# Patient Record
Sex: Female | Born: 1945
Health system: Southern US, Community
[De-identification: ages and names within clinical notes are randomized; demographics above are authoritative.]

## PROBLEM LIST (undated history)

## (undated) DIAGNOSIS — I38 Endocarditis, valve unspecified: Secondary | ICD-10-CM

## (undated) DIAGNOSIS — R0601 Orthopnea: Secondary | ICD-10-CM

## (undated) DIAGNOSIS — R0600 Dyspnea, unspecified: Secondary | ICD-10-CM

## (undated) DIAGNOSIS — D696 Thrombocytopenia, unspecified: Secondary | ICD-10-CM

## (undated) DIAGNOSIS — K219 Gastro-esophageal reflux disease without esophagitis: Secondary | ICD-10-CM

## (undated) DIAGNOSIS — IMO0001 Reserved for inherently not codable concepts without codable children: Secondary | ICD-10-CM

## (undated) DIAGNOSIS — T8859XA Other complications of anesthesia, initial encounter: Secondary | ICD-10-CM

## (undated) DIAGNOSIS — I519 Heart disease, unspecified: Secondary | ICD-10-CM

## (undated) DIAGNOSIS — F32A Depression, unspecified: Secondary | ICD-10-CM

## (undated) DIAGNOSIS — E785 Hyperlipidemia, unspecified: Secondary | ICD-10-CM

## (undated) DIAGNOSIS — T4145XA Adverse effect of unspecified anesthetic, initial encounter: Secondary | ICD-10-CM

## (undated) DIAGNOSIS — R112 Nausea with vomiting, unspecified: Secondary | ICD-10-CM

## (undated) DIAGNOSIS — I420 Dilated cardiomyopathy: Secondary | ICD-10-CM

## (undated) DIAGNOSIS — E079 Disorder of thyroid, unspecified: Secondary | ICD-10-CM

## (undated) DIAGNOSIS — E119 Type 2 diabetes mellitus without complications: Secondary | ICD-10-CM

## (undated) DIAGNOSIS — K5790 Diverticulosis of intestine, part unspecified, without perforation or abscess without bleeding: Secondary | ICD-10-CM

## (undated) DIAGNOSIS — R609 Edema, unspecified: Secondary | ICD-10-CM

## (undated) DIAGNOSIS — Z95810 Presence of automatic (implantable) cardiac defibrillator: Secondary | ICD-10-CM

## (undated) DIAGNOSIS — E039 Hypothyroidism, unspecified: Secondary | ICD-10-CM

## (undated) DIAGNOSIS — Z9889 Other specified postprocedural states: Secondary | ICD-10-CM

## (undated) DIAGNOSIS — F419 Anxiety disorder, unspecified: Secondary | ICD-10-CM

## (undated) DIAGNOSIS — I499 Cardiac arrhythmia, unspecified: Secondary | ICD-10-CM

## (undated) DIAGNOSIS — D649 Anemia, unspecified: Secondary | ICD-10-CM

## (undated) DIAGNOSIS — F329 Major depressive disorder, single episode, unspecified: Secondary | ICD-10-CM

## (undated) DIAGNOSIS — I509 Heart failure, unspecified: Secondary | ICD-10-CM

## (undated) HISTORY — DX: Endocarditis, valve unspecified: I38

## (undated) HISTORY — PX: INSERT / REPLACE / REMOVE PACEMAKER: SUR710

## (undated) HISTORY — DX: Gastro-esophageal reflux disease without esophagitis: K21.9

## (undated) HISTORY — DX: Major depressive disorder, single episode, unspecified: F32.9

## (undated) HISTORY — DX: Type 2 diabetes mellitus without complications: E11.9

## (undated) HISTORY — DX: Dilated cardiomyopathy: I42.0

## (undated) HISTORY — DX: Heart disease, unspecified: I51.9

## (undated) HISTORY — DX: Reserved for inherently not codable concepts without codable children: IMO0001

## (undated) HISTORY — PX: BREAST EXCISIONAL BIOPSY: SUR124

## (undated) HISTORY — DX: Thrombocytopenia, unspecified: D69.6

## (undated) HISTORY — DX: Heart failure, unspecified: I50.9

## (undated) HISTORY — PX: ABDOMINAL HYSTERECTOMY: SHX81

## (undated) HISTORY — PX: BREAST BIOPSY: SHX20

## (undated) HISTORY — DX: Anxiety disorder, unspecified: F41.9

## (undated) HISTORY — PX: CARDIAC CATHETERIZATION: SHX172

## (undated) HISTORY — DX: Depression, unspecified: F32.A

## (undated) HISTORY — DX: Diverticulosis of intestine, part unspecified, without perforation or abscess without bleeding: K57.90

## (undated) HISTORY — PX: CHOLECYSTECTOMY: SHX55

## (undated) HISTORY — DX: Disorder of thyroid, unspecified: E07.9

## (undated) HISTORY — DX: Hyperlipidemia, unspecified: E78.5

---

## 1975-11-08 HISTORY — PX: PARTIAL HYSTERECTOMY: SHX80

## 2004-09-17 ENCOUNTER — Ambulatory Visit: Payer: Self-pay | Admitting: Endocrinology

## 2005-11-07 HISTORY — PX: BONE MARROW BIOPSY: SHX199

## 2006-04-29 ENCOUNTER — Emergency Department: Payer: Self-pay | Admitting: Unknown Physician Specialty

## 2006-04-29 ENCOUNTER — Other Ambulatory Visit: Payer: Self-pay

## 2007-05-03 ENCOUNTER — Ambulatory Visit: Payer: Self-pay | Admitting: Endocrinology

## 2007-10-01 ENCOUNTER — Emergency Department: Payer: Self-pay | Admitting: Emergency Medicine

## 2009-10-28 ENCOUNTER — Ambulatory Visit: Payer: Self-pay | Admitting: Endocrinology

## 2010-11-26 ENCOUNTER — Emergency Department: Payer: Self-pay | Admitting: Internal Medicine

## 2011-06-22 ENCOUNTER — Ambulatory Visit: Payer: Self-pay | Admitting: Endocrinology

## 2012-05-29 ENCOUNTER — Ambulatory Visit: Payer: Self-pay | Admitting: Endocrinology

## 2012-06-25 ENCOUNTER — Ambulatory Visit: Payer: Self-pay | Admitting: Endocrinology

## 2013-04-02 ENCOUNTER — Emergency Department: Payer: Self-pay | Admitting: Internal Medicine

## 2013-04-02 LAB — URINALYSIS, COMPLETE
Bacteria: NONE SEEN
Glucose,UR: NEGATIVE mg/dL (ref 0–75)
Leukocyte Esterase: NEGATIVE
Nitrite: NEGATIVE
Ph: 5 (ref 4.5–8.0)
Protein: 30
RBC,UR: 1 /HPF (ref 0–5)
Specific Gravity: 1.024 (ref 1.003–1.030)
Squamous Epithelial: NONE SEEN
WBC UR: 2 /HPF (ref 0–5)

## 2013-04-02 LAB — CBC
HCT: 39.8 % (ref 35.0–47.0)
HGB: 13.3 g/dL (ref 12.0–16.0)
MCH: 30.4 pg (ref 26.0–34.0)
MCV: 91 fL (ref 80–100)
Platelet: 117 10*3/uL — ABNORMAL LOW (ref 150–440)
RBC: 4.38 10*6/uL (ref 3.80–5.20)
WBC: 9.6 10*3/uL (ref 3.6–11.0)

## 2013-04-02 LAB — LIPASE, BLOOD: Lipase: 97 U/L (ref 73–393)

## 2013-04-03 LAB — COMPREHENSIVE METABOLIC PANEL
Albumin: 3.8 g/dL (ref 3.4–5.0)
Alkaline Phosphatase: 67 U/L (ref 50–136)
Bilirubin,Total: 0.9 mg/dL (ref 0.2–1.0)
Calcium, Total: 9.3 mg/dL (ref 8.5–10.1)
Co2: 27 mmol/L (ref 21–32)
Creatinine: 0.88 mg/dL (ref 0.60–1.30)
Osmolality: 273 (ref 275–301)
SGOT(AST): 17 U/L (ref 15–37)
SGPT (ALT): 16 U/L (ref 12–78)
Sodium: 136 mmol/L (ref 136–145)
Total Protein: 8.3 g/dL — ABNORMAL HIGH (ref 6.4–8.2)

## 2013-04-07 LAB — CULTURE, BLOOD (SINGLE)

## 2013-05-07 ENCOUNTER — Ambulatory Visit: Payer: Self-pay | Admitting: Gastroenterology

## 2013-05-07 LAB — HM COLONOSCOPY: HM Colonoscopy: NORMAL

## 2013-08-06 ENCOUNTER — Ambulatory Visit: Payer: Self-pay | Admitting: Family Medicine

## 2014-02-20 LAB — BASIC METABOLIC PANEL
Anion Gap: 8 (ref 7–16)
BUN: 18 mg/dL (ref 7–18)
Calcium, Total: 9.1 mg/dL (ref 8.5–10.1)
Chloride: 110 mmol/L — ABNORMAL HIGH (ref 98–107)
Co2: 24 mmol/L (ref 21–32)
Creatinine: 0.99 mg/dL (ref 0.60–1.30)
EGFR (African American): >60
EGFR (Non-African Amer.): 59 — ABNORMAL LOW
GLUCOSE: 140 mg/dL — AB (ref 65–99)
OSMOLALITY: 287 (ref 275–301)
Potassium: 4.2 mmol/L (ref 3.5–5.1)
Sodium: 142 mmol/L (ref 136–145)

## 2014-02-20 LAB — CBC
HCT: 40.4 % (ref 35.0–47.0)
HGB: 13.1 g/dL (ref 12.0–16.0)
MCH: 30.1 pg (ref 26.0–34.0)
MCHC: 32.4 g/dL (ref 32.0–36.0)
MCV: 93 fL (ref 80–100)
Platelet: 114 10*3/uL — ABNORMAL LOW (ref 150–440)
RBC: 4.34 10*6/uL (ref 3.80–5.20)
RDW: 14.3 % (ref 11.5–14.5)
WBC: 4 10*3/uL (ref 3.6–11.0)

## 2014-02-20 LAB — TROPONIN I: Troponin-I: 0.05 ng/mL

## 2014-02-21 ENCOUNTER — Inpatient Hospital Stay: Payer: Self-pay | Admitting: Internal Medicine

## 2014-02-21 LAB — TSH: THYROID STIMULATING HORM: 3.71 u[IU]/mL

## 2014-02-21 LAB — LIPID PANEL
Cholesterol: 130 mg/dL (ref 0–200)
HDL: 42 mg/dL (ref 40–60)
LDL CHOLESTEROL, CALC: 69 mg/dL (ref 0–100)
Triglycerides: 94 mg/dL (ref 0–200)
VLDL Cholesterol, Calc: 19 mg/dL (ref 5–40)

## 2014-02-21 LAB — PRO B NATRIURETIC PEPTIDE: B-TYPE NATIURETIC PEPTID: 9422 pg/mL — AB (ref 0–125)

## 2014-02-21 LAB — CK-MB: CK-MB: 3.1 ng/mL (ref 0.5–3.6)

## 2014-02-21 LAB — HEMOGLOBIN A1C: Hemoglobin A1C: 6.3 % (ref 4.2–6.3)

## 2014-02-21 LAB — TROPONIN I
Troponin-I: 0.08 ng/mL — ABNORMAL HIGH
Troponin-I: 0.09 ng/mL — ABNORMAL HIGH

## 2014-02-22 LAB — CBC WITH DIFFERENTIAL/PLATELET
EOS PCT: 1 %
HCT: 40.2 % (ref 35.0–47.0)
HGB: 12.8 g/dL (ref 12.0–16.0)
Lymphocytes: 68 %
MCH: 29.6 pg (ref 26.0–34.0)
MCHC: 31.8 g/dL — ABNORMAL LOW (ref 32.0–36.0)
MCV: 93 fL (ref 80–100)
Monocytes: 7 %
Platelet: 106 10*3/uL — ABNORMAL LOW (ref 150–440)
RBC: 4.32 10*6/uL (ref 3.80–5.20)
RDW: 14.5 % (ref 11.5–14.5)
Segmented Neutrophils: 20 %
Variant Lymphocyte - H1-Rlymph: 4 %
WBC: 3.6 10*3/uL (ref 3.6–11.0)

## 2014-02-22 LAB — BASIC METABOLIC PANEL
ANION GAP: 9 (ref 7–16)
BUN: 21 mg/dL — ABNORMAL HIGH (ref 7–18)
CALCIUM: 8.9 mg/dL (ref 8.5–10.1)
Chloride: 106 mmol/L (ref 98–107)
Co2: 26 mmol/L (ref 21–32)
Creatinine: 0.95 mg/dL (ref 0.60–1.30)
EGFR (Non-African Amer.): >60
Glucose: 93 mg/dL (ref 65–99)
Osmolality: 284 (ref 275–301)
POTASSIUM: 3.9 mmol/L (ref 3.5–5.1)
Sodium: 141 mmol/L (ref 136–145)

## 2014-02-22 LAB — CK TOTAL AND CKMB (NOT AT ARMC)
CK, TOTAL: 111 U/L
CK, Total: 103 U/L
CK-MB: 2.7 ng/mL (ref 0.5–3.6)
CK-MB: 3 ng/mL (ref 0.5–3.6)

## 2014-02-22 LAB — MAGNESIUM: MAGNESIUM: 1.6 mg/dL — AB

## 2014-02-24 LAB — CREATININE, SERUM
CREATININE: 0.86 mg/dL (ref 0.60–1.30)
EGFR (African American): >60

## 2014-02-24 LAB — PLATELET COUNT: Platelet: 112 10*3/uL — ABNORMAL LOW (ref 150–440)

## 2014-02-25 LAB — CBC WITH DIFFERENTIAL/PLATELET
Basophil #: 0 10*3/uL (ref 0.0–0.1)
Basophil %: 0.3 %
EOS ABS: 0 10*3/uL (ref 0.0–0.7)
EOS PCT: 1 %
HCT: 37.3 % (ref 35.0–47.0)
HGB: 12.1 g/dL (ref 12.0–16.0)
LYMPHS PCT: 40.5 %
Lymphocyte #: 1.4 10*3/uL (ref 1.0–3.6)
MCH: 30.3 pg (ref 26.0–34.0)
MCHC: 32.5 g/dL (ref 32.0–36.0)
MCV: 93 fL (ref 80–100)
Monocyte #: 0.5 x10 3/mm (ref 0.2–0.9)
Monocyte %: 13.9 %
Neutrophil #: 1.5 10*3/uL (ref 1.4–6.5)
Neutrophil %: 44.3 %
Platelet: 89 10*3/uL — ABNORMAL LOW (ref 150–440)
RBC: 4 10*6/uL (ref 3.80–5.20)
RDW: 14.6 % — ABNORMAL HIGH (ref 11.5–14.5)
WBC: 3.4 10*3/uL — ABNORMAL LOW (ref 3.6–11.0)

## 2014-02-25 LAB — BASIC METABOLIC PANEL
Anion Gap: 5 — ABNORMAL LOW (ref 7–16)
BUN: 13 mg/dL (ref 7–18)
CALCIUM: 8.1 mg/dL — AB (ref 8.5–10.1)
CHLORIDE: 103 mmol/L (ref 98–107)
Co2: 30 mmol/L (ref 21–32)
Creatinine: 0.81 mg/dL (ref 0.60–1.30)
EGFR (Non-African Amer.): >60
Glucose: 150 mg/dL — ABNORMAL HIGH (ref 65–99)
OSMOLALITY: 279 (ref 275–301)
POTASSIUM: 3.6 mmol/L (ref 3.5–5.1)
SODIUM: 138 mmol/L (ref 136–145)

## 2014-02-27 LAB — BASIC METABOLIC PANEL
ANION GAP: 5 — AB (ref 7–16)
BUN: 12 mg/dL (ref 7–18)
CHLORIDE: 107 mmol/L (ref 98–107)
Calcium, Total: 8.9 mg/dL (ref 8.5–10.1)
Co2: 28 mmol/L (ref 21–32)
Creatinine: 0.8 mg/dL (ref 0.60–1.30)
Glucose: 119 mg/dL — ABNORMAL HIGH (ref 65–99)
Osmolality: 280 (ref 275–301)
Potassium: 4.3 mmol/L (ref 3.5–5.1)
SODIUM: 140 mmol/L (ref 136–145)

## 2014-04-01 ENCOUNTER — Ambulatory Visit: Payer: Self-pay | Admitting: Internal Medicine

## 2014-04-07 ENCOUNTER — Ambulatory Visit: Payer: Self-pay | Admitting: Internal Medicine

## 2014-04-07 LAB — CBC CANCER CENTER
Eosinophil: 3 %
HCT: 39.2 % (ref 35.0–47.0)
HGB: 12.7 g/dL (ref 12.0–16.0)
Lymphocytes: 40 %
MCH: 29.9 pg (ref 26.0–34.0)
MCHC: 32.5 g/dL (ref 32.0–36.0)
MCV: 92 fL (ref 80–100)
Monocytes: 11 %
RBC: 4.26 10*6/uL (ref 3.80–5.20)
RDW: 13.9 % (ref 11.5–14.5)
Segmented Neutrophils: 41 %
Variant Lymphocyte: 5 %
WBC: 2.8 x10 3/mm — AB (ref 3.6–11.0)

## 2014-04-07 LAB — PROTIME-INR
INR: 1.1
PROTHROMBIN TIME: 14.1 s (ref 11.5–14.7)

## 2014-04-07 LAB — IRON AND TIBC
IRON SATURATION: 29 %
IRON: 119 ug/dL (ref 50–170)
Iron Bind.Cap.(Total): 405 ug/dL (ref 250–450)
UNBOUND IRON-BIND. CAP.: 286 ug/dL

## 2014-04-07 LAB — RETICULOCYTES
Absolute Retic Count: 0.0348 10*6/uL (ref 0.019–0.186)
Reticulocyte: 0.82 % (ref 0.4–3.1)

## 2014-04-07 LAB — FOLATE: FOLIC ACID: 27.7 ng/mL (ref 3.1–100.0)

## 2014-04-07 LAB — APTT: ACTIVATED PTT: 29.9 s (ref 23.6–35.9)

## 2014-04-07 LAB — LACTATE DEHYDROGENASE: LDH: 173 U/L (ref 81–246)

## 2014-04-07 LAB — FIBRIN DEGRADATION PROD.(ARMC ONLY): Fibrin Degradation Prod.: <10 ug/ml (ref 2.1–7.7)

## 2014-04-07 LAB — FERRITIN: Ferritin (ARMC): 18 ng/mL (ref 8–388)

## 2014-04-07 LAB — FIBRINOGEN: Fibrinogen: 279 mg/dL (ref 210–470)

## 2014-04-08 LAB — URINE IEP, RANDOM

## 2014-04-09 LAB — PROT IMMUNOELECTROPHORES(ARMC)

## 2014-04-10 DIAGNOSIS — E782 Mixed hyperlipidemia: Secondary | ICD-10-CM | POA: Insufficient documentation

## 2014-04-10 DIAGNOSIS — I38 Endocarditis, valve unspecified: Secondary | ICD-10-CM | POA: Insufficient documentation

## 2014-04-16 LAB — CBC CANCER CENTER
Comment - H1-Com1: NORMAL
Eosinophil: 4 %
HCT: 39.7 % (ref 35.0–47.0)
HGB: 12.9 g/dL (ref 12.0–16.0)
Lymphocytes: 67 %
MCH: 29.9 pg (ref 26.0–34.0)
MCHC: 32.6 g/dL (ref 32.0–36.0)
MCV: 92 fL (ref 80–100)
Monocytes: 7 %
Platelet: 92 x10 3/mm — ABNORMAL LOW (ref 150–440)
RBC: 4.31 10*6/uL (ref 3.80–5.20)
RDW: 14.2 % (ref 11.5–14.5)
SEGMENTED NEUTROPHILS: 22 %
WBC: 2.4 x10 3/mm — AB (ref 3.6–11.0)

## 2014-04-16 LAB — RETICULOCYTES
ABSOLUTE RETIC COUNT: 0.0422 10*6/uL (ref 0.019–0.186)
Reticulocyte: 0.98 % (ref 0.4–3.1)

## 2014-04-16 LAB — HM DEXA SCAN: HM Dexa Scan: NORMAL

## 2014-04-16 LAB — HM MAMMOGRAPHY

## 2014-04-24 ENCOUNTER — Ambulatory Visit: Payer: Self-pay | Admitting: Family

## 2014-05-07 ENCOUNTER — Ambulatory Visit: Payer: Self-pay | Admitting: Internal Medicine

## 2014-05-21 ENCOUNTER — Ambulatory Visit: Payer: Self-pay | Admitting: Family

## 2014-05-21 LAB — BASIC METABOLIC PANEL
Anion Gap: 6 — ABNORMAL LOW (ref 7–16)
BUN: 10 mg/dL (ref 7–18)
CREATININE: 0.78 mg/dL (ref 0.60–1.30)
Calcium, Total: 9 mg/dL (ref 8.5–10.1)
Chloride: 105 mmol/L (ref 98–107)
Co2: 26 mmol/L (ref 21–32)
EGFR (African American): >60
EGFR (Non-African Amer.): >60
Glucose: 112 mg/dL — ABNORMAL HIGH (ref 65–99)
OSMOLALITY: 274 (ref 275–301)
Potassium: 3.9 mmol/L (ref 3.5–5.1)
Sodium: 137 mmol/L (ref 136–145)

## 2014-06-25 ENCOUNTER — Ambulatory Visit: Payer: Self-pay | Admitting: Family

## 2014-07-02 ENCOUNTER — Ambulatory Visit: Payer: Self-pay | Admitting: Family

## 2014-07-07 ENCOUNTER — Encounter: Payer: Self-pay | Admitting: Internal Medicine

## 2014-07-08 ENCOUNTER — Ambulatory Visit: Payer: Self-pay | Admitting: Family

## 2014-07-08 ENCOUNTER — Encounter: Payer: Self-pay | Admitting: Internal Medicine

## 2014-07-15 ENCOUNTER — Ambulatory Visit: Payer: Self-pay | Admitting: Family

## 2014-08-07 ENCOUNTER — Ambulatory Visit: Payer: Self-pay | Admitting: Family

## 2014-08-07 ENCOUNTER — Encounter: Payer: Self-pay | Admitting: Internal Medicine

## 2014-08-28 ENCOUNTER — Ambulatory Visit: Payer: Self-pay | Admitting: Internal Medicine

## 2014-09-07 ENCOUNTER — Encounter: Payer: Self-pay | Admitting: Internal Medicine

## 2014-09-19 ENCOUNTER — Ambulatory Visit: Payer: Self-pay | Admitting: Family

## 2014-09-19 LAB — BASIC METABOLIC PANEL
Anion Gap: 7 (ref 7–16)
BUN: 13 mg/dL (ref 7–18)
CO2: 29 mmol/L (ref 21–32)
Calcium, Total: 8.9 mg/dL (ref 8.5–10.1)
Chloride: 107 mmol/L (ref 98–107)
Creatinine: 0.79 mg/dL (ref 0.60–1.30)
EGFR (African American): >60
EGFR (Non-African Amer.): >60
GLUCOSE: 98 mg/dL (ref 65–99)
Osmolality: 285 (ref 275–301)
Potassium: 4.1 mmol/L (ref 3.5–5.1)
SODIUM: 143 mmol/L (ref 136–145)

## 2014-09-19 LAB — CK-MB: CK-MB: 1.3 ng/mL (ref 0.5–3.6)

## 2014-09-19 LAB — PRO B NATRIURETIC PEPTIDE: B-Type Natriuretic Peptide: 6094 pg/mL — ABNORMAL HIGH (ref 0–125)

## 2014-09-19 LAB — TROPONIN I: Troponin-I: <0.02 ng/mL

## 2014-09-20 ENCOUNTER — Emergency Department: Payer: Self-pay | Admitting: Student

## 2014-09-20 LAB — CK TOTAL AND CKMB (NOT AT ARMC)
CK, TOTAL: 114 U/L
CK, Total: 85 U/L
CK-MB: 1.1 ng/mL (ref 0.5–3.6)
CK-MB: 1.4 ng/mL (ref 0.5–3.6)

## 2014-09-20 LAB — PRO B NATRIURETIC PEPTIDE: B-TYPE NATIURETIC PEPTID: 4552 pg/mL — AB (ref 0–125)

## 2014-09-20 LAB — CBC
HCT: 37.4 % (ref 35.0–47.0)
HGB: 12.1 g/dL (ref 12.0–16.0)
MCH: 30.8 pg (ref 26.0–34.0)
MCHC: 32.3 g/dL (ref 32.0–36.0)
MCV: 95 fL (ref 80–100)
Platelet: 107 10*3/uL — ABNORMAL LOW (ref 150–440)
RBC: 3.93 10*6/uL (ref 3.80–5.20)
RDW: 13.5 % (ref 11.5–14.5)
WBC: 2.8 10*3/uL — ABNORMAL LOW (ref 3.6–11.0)

## 2014-09-20 LAB — BASIC METABOLIC PANEL
ANION GAP: 6 — AB (ref 7–16)
BUN: 11 mg/dL (ref 7–18)
Calcium, Total: 8.3 mg/dL — ABNORMAL LOW (ref 8.5–10.1)
Chloride: 111 mmol/L — ABNORMAL HIGH (ref 98–107)
Co2: 25 mmol/L (ref 21–32)
Creatinine: 0.74 mg/dL (ref 0.60–1.30)
EGFR (African American): >60
EGFR (Non-African Amer.): >60
Glucose: 128 mg/dL — ABNORMAL HIGH (ref 65–99)
Osmolality: 284 (ref 275–301)
Potassium: 5 mmol/L (ref 3.5–5.1)
SODIUM: 142 mmol/L (ref 136–145)

## 2014-09-20 LAB — TROPONIN I
Troponin-I: <0.02 ng/mL
Troponin-I: <0.02 ng/mL

## 2014-09-25 ENCOUNTER — Encounter: Payer: Self-pay | Admitting: Cardiovascular Disease

## 2014-09-25 DIAGNOSIS — I5022 Chronic systolic (congestive) heart failure: Secondary | ICD-10-CM | POA: Insufficient documentation

## 2014-10-01 ENCOUNTER — Ambulatory Visit: Payer: Self-pay | Admitting: Internal Medicine

## 2014-10-01 LAB — CBC CANCER CENTER
BASOS PCT: 0.5 %
Basophil #: 0 x10 3/mm (ref 0.0–0.1)
EOS PCT: 4.5 %
Eosinophil #: 0.1 x10 3/mm (ref 0.0–0.7)
HCT: 39 % (ref 35.0–47.0)
HGB: 12.6 g/dL (ref 12.0–16.0)
LYMPHS ABS: 1.7 x10 3/mm (ref 1.0–3.6)
Lymphocyte %: 62.8 %
MCH: 30.6 pg (ref 26.0–34.0)
MCHC: 32.4 g/dL (ref 32.0–36.0)
MCV: 94 fL (ref 80–100)
MONO ABS: 0.3 x10 3/mm (ref 0.2–0.9)
Monocyte %: 10.9 %
NEUTROS PCT: 21.3 %
Neutrophil #: 0.6 x10 3/mm — ABNORMAL LOW (ref 1.4–6.5)
PLATELETS: 104 x10 3/mm — AB (ref 150–440)
RBC: 4.13 10*6/uL (ref 3.80–5.20)
RDW: 13.3 % (ref 11.5–14.5)
WBC: 2.7 x10 3/mm — AB (ref 3.6–11.0)

## 2014-10-01 LAB — IRON AND TIBC
Iron Bind.Cap.(Total): 331 ug/dL (ref 250–450)
Iron Saturation: 20 %
Iron: 66 ug/dL (ref 50–170)
Unbound Iron-Bind.Cap.: 265 ug/dL

## 2014-10-01 LAB — FERRITIN: FERRITIN (ARMC): 75 ng/mL (ref 8–388)

## 2014-10-07 ENCOUNTER — Encounter: Payer: Self-pay | Admitting: Internal Medicine

## 2014-10-07 ENCOUNTER — Ambulatory Visit: Payer: Self-pay | Admitting: Internal Medicine

## 2014-10-15 ENCOUNTER — Encounter: Payer: Self-pay | Admitting: Cardiovascular Disease

## 2014-10-15 ENCOUNTER — Ambulatory Visit: Payer: Self-pay | Admitting: Family

## 2014-10-15 LAB — BASIC METABOLIC PANEL
Anion Gap: 8 (ref 7–16)
BUN: 12 mg/dL (ref 7–18)
CO2: 27 mmol/L (ref 21–32)
CREATININE: 0.81 mg/dL (ref 0.60–1.30)
Calcium, Total: 8.8 mg/dL (ref 8.5–10.1)
Chloride: 106 mmol/L (ref 98–107)
EGFR (African American): >60
EGFR (Non-African Amer.): >60
Glucose: 171 mg/dL — ABNORMAL HIGH (ref 65–99)
OSMOLALITY: 285 (ref 275–301)
POTASSIUM: 3.9 mmol/L (ref 3.5–5.1)
SODIUM: 141 mmol/L (ref 136–145)

## 2014-10-15 LAB — TSH: Thyroid Stimulating Horm: 2.55 u[IU]/mL

## 2014-10-22 ENCOUNTER — Ambulatory Visit: Payer: Self-pay | Admitting: Gastroenterology

## 2014-11-10 ENCOUNTER — Emergency Department: Payer: Self-pay | Admitting: Emergency Medicine

## 2014-11-10 LAB — CBC
HCT: 40.3 % (ref 35.0–47.0)
HGB: 13 g/dL (ref 12.0–16.0)
MCH: 30.6 pg (ref 26.0–34.0)
MCHC: 32.2 g/dL (ref 32.0–36.0)
MCV: 95 fL (ref 80–100)
PLATELETS: 120 10*3/uL — AB (ref 150–440)
RBC: 4.23 10*6/uL (ref 3.80–5.20)
RDW: 12.8 % (ref 11.5–14.5)
WBC: 2.8 10*3/uL — AB (ref 3.6–11.0)

## 2014-11-10 LAB — BASIC METABOLIC PANEL
ANION GAP: 7 (ref 7–16)
BUN: 16 mg/dL (ref 7–18)
CO2: 28 mmol/L (ref 21–32)
Calcium, Total: 9.2 mg/dL (ref 8.5–10.1)
Chloride: 105 mmol/L (ref 98–107)
Creatinine: 0.89 mg/dL (ref 0.60–1.30)
Glucose: 115 mg/dL — ABNORMAL HIGH (ref 65–99)
OSMOLALITY: 282 (ref 275–301)
Potassium: 4 mmol/L (ref 3.5–5.1)
Sodium: 140 mmol/L (ref 136–145)

## 2014-11-10 LAB — TROPONIN I

## 2014-11-17 ENCOUNTER — Ambulatory Visit: Payer: Self-pay | Admitting: Family Medicine

## 2014-11-23 LAB — HEPATIC FUNCTION PANEL
ALT: 8 U/L (ref 7–35)
AST: 21 U/L (ref 13–35)

## 2014-11-23 LAB — BASIC METABOLIC PANEL
BUN: 12 mg/dL (ref 4–21)
Creatinine: 0.8 mg/dL (ref 0.5–1.1)
Glucose: 108 mg/dL
Potassium: 4.3 mmol/L (ref 3.4–5.3)
Sodium: 142 mmol/L (ref 137–147)

## 2014-11-23 LAB — CBC AND DIFFERENTIAL: WBC: 2.7 10*3/mL

## 2014-11-23 LAB — TSH: TSH: 2.7 u[IU]/mL (ref 0.41–5.90)

## 2014-11-25 DIAGNOSIS — I471 Supraventricular tachycardia: Secondary | ICD-10-CM | POA: Insufficient documentation

## 2014-12-18 ENCOUNTER — Ambulatory Visit: Payer: Self-pay | Admitting: Family

## 2015-01-22 ENCOUNTER — Encounter: Payer: Self-pay | Admitting: *Deleted

## 2015-01-22 DIAGNOSIS — R079 Chest pain, unspecified: Secondary | ICD-10-CM | POA: Insufficient documentation

## 2015-01-26 ENCOUNTER — Encounter: Payer: Self-pay | Admitting: Internal Medicine

## 2015-01-26 ENCOUNTER — Ambulatory Visit (INDEPENDENT_AMBULATORY_CARE_PROVIDER_SITE_OTHER): Payer: Medicare PPO | Admitting: Internal Medicine

## 2015-01-26 VITALS — BP 94/50 | HR 66 | Ht 61.0 in | Wt 117.6 lb

## 2015-01-26 DIAGNOSIS — I509 Heart failure, unspecified: Secondary | ICD-10-CM

## 2015-01-26 MED ORDER — SPIRONOLACTONE 25 MG PO TABS: 12.5000 mg | ORAL_TABLET | Freq: Every day | ORAL | Status: DC

## 2015-01-26 MED ORDER — CARVEDILOL 3.125 MG PO TABS: 3.1250 mg | ORAL_TABLET | Freq: Two times a day (BID) | ORAL | Status: DC

## 2015-01-26 NOTE — Progress Notes (Addendum)
ELECTROPHYSIOLOGY CONSULT NOTE  Patient ID: Bethany [name], MRN: [id number], DOB/AGE: 1946-03-13 69 y.o. Admit date: (Not on file) Date of Consult: 01/26/2015  Primary Physician: [name], MD Primary Cardiologist: BK   Chief Complaint: Defibrillator   HPI Bethany [name] is a 69 y.o. female  He is referred for consideration of a CRT-D implantation.  She has a history of a nonischemic cardiomyopathy; she is undergoing catheterization 2012 demonstrating normal coronary arteries; echocardiogram at that point demonstrated ejection fraction of 20%. (This is all obtained from reviewing records from [facility].) She also underwent echocardiogram 3/16 by Dr. [name] at [facility]. Ejection fraction was 15-20%.   She has chronic systolic heart failure; she has been managed with metoprolol tartrate, Aldactone 3 days a week; she has not been on an ACE inhibitor.  She's had significant issues with low blood pressure minimizing the ability to adjust her heart failure medications.  She has nocturnal dyspnea and some orthopnea. She has mild peripheral edema.  She's had some palpitations but no syncope or presyncope.  Past Medical History  Diagnosis Date  . Diabetes   . Hypertension   . Hyperlipidemia   . Reflux   . Diverticulosis   . Thrombocytopenia   . CHF (congestive heart failure)   . LV dysfunction   . VHD (valvular heart disease)   . Dilated cardiomyopathy       Surgical History:  Past Surgical History  Procedure Laterality Date  . Cardiac catheterization       Home Meds: Prior to Admission medications   Medication Sig Start Date End Date Taking? Authorizing Provider  escitalopram (LEXAPRO) 10 MG tablet Take 1 tablet by mouth daily. 01/02/15  Yes Historical Provider, MD  furosemide (LASIX) 20 MG tablet Take 1 tablet by mouth daily. 09/22/14 09/22/15 Yes Historical Provider, MD  IRON PO Take 1 tablet by mouth daily.   Yes Historical Provider, MD  levothyroxine (SYNTHROID, LEVOTHROID) 50  MCG tablet Take 1 tablet by mouth daily.   Yes Historical Provider, MD  metFORMIN (GLUCOPHAGE) 500 MG tablet Take 1 tablet by mouth 2 (two) times daily. 06/12/14  Yes Historical Provider, MD  metoprolol tartrate (LOPRESSOR) 25 MG tablet Take 12.5 mg by mouth 2 (two) times daily.   Yes Historical Provider, MD  pantoprazole (PROTONIX) 40 MG tablet Take 1 tablet by mouth daily.   Yes Historical Provider, MD  spironolactone (ALDACTONE) 25 MG tablet Take 1 tablet by mouth 3 (three) times a week.   Yes Historical Provider, MD      Allergies:  Allergies  Allergen Reactions  . Other Rash    SKIN RASHES/HIVES    History   Social History  . Marital Status: Divorced    Spouse Name: [name]  . Number of Children: [name]  . Years of Education: [name]   Occupational History  . Not on file.   Social History Main Topics  . Smoking status: Never Smoker   . Smokeless tobacco: Not on file  . Alcohol Use: No  . Drug Use: No  . Sexual Activity: Not on file   Other Topics Concern  . Not on file   Social History Narrative     Family History  Problem Relation Age of Onset  . Heart disease Mother   . Heart attack Mother   . Prostate cancer Father   . Ulcers Father   . Stroke Sister   . Diabetes Sister      ROS:  Please see the history of present  illness.     All other systems reviewed and negative.    Physical Exam:   Blood pressure 94/50, pulse 66, height  (1.549 m), weight 117 lb 9.6 oz (53.343 kg). General: Well developed, well nourished female in no acute distress. Head: Normocephalic, atraumatic, sclera non-icteric, no xanthomas, nares are without discharge. EENT: normal Lymph Nodes:  none Back: without scoliosis/kyphosis , no CVA tendersness Neck: Negative for carotid bruits. JVD not elevated. Lungs: Clear bilaterally to auscultation without wheezes, rales, or rhonchi. Breathing is unlabored. Heart: RRR with S1 S2.   Earl;y 3/6 systolic   murmur , rubs, or gallops  appreciated. Abdomen: Soft, non-tender, non-distended with normoactive bowel sounds. No hepatomegaly. No rebound/guarding. No obvious abdominal masses. Msk:  Strength and tone appear normal for age. Extremities: No clubbing or cyanosis. No  edema.  Distal pedal pulses are 2+ and equal bilaterally. Skin: Warm and Dry Neuro: Alert and oriented X 3. CN III-XII intact Grossly normal sensory and motor function . Psych:  Responds to questions appropriately with a normal affect.      Labs: Cardiac Enzymes No results for input(s): CKTOTAL, CKMB, TROPONINI in the last 72 hours. CBC   Miscellaneous No results found for: DDIMER  Radiology/Studies:  No results found.  EKG:  NSR  66 14/15/46 Left bundle branch block Axis 115     Assessment and Plan:   NICM  CHF class 2B3A  LBBB  Hypotension  The patient has persistent left ventricular dysfunction and maximally titrated guidelines directed therapy limited by hypotension. It is appropriate to consider ICD implantation for primary prevention. She also has left bundle branch block with a QRS duration of 150 ms and so CRT implantation at the time of ICD is also appropriate given her nonischemic cardiac myopathy.  Have reviewed the potential benefits and risks of ICD implantation including but not limited to death, perforation of heart or lung, lead dislodgement, infection,  device malfunction and inappropriate shocks.  We also discussed the possibility of lack of response especially given the right axis deviation associated with her left bundle. The patient and family express understanding  and are willing to proceed.    Based on guidelines in the COMET Data  also change metoprolol to carvedilol and we'll have her take her Aldactone daily at a lower dose.     [name]   there

## 2015-01-26 NOTE — Patient Instructions (Addendum)
Your physician has recommended you make the following change in your medication:  1) START Coreg 3.125mg  twice a day 2) DECREASE Spironolactone 12.5 mg daily (half a tablet)   Your physician has recommended that you have a pacemaker inserted. A pacemaker is a small device that is placed under the skin of your chest or abdomen to help control abnormal heart rhythms. This device uses electrical pulses to prompt the heart to beat at a normal rate. Pacemakers are used to treat heart rhythms that are too slow. Wire (leads) are attached to the pacemaker that goes into the chambers of you heart. This is done in the hospital and usually requires and overnight stay. Please see the instruction sheet given to you today for more information.  Biventricular Pacemaker Implantation A pacemaker is a small, lightweight, battery-powered device that is placed (implanted) under the skin in the upper chest. Your health care provider may prescribe a pacemaker for you if your heartbeat is too slow (bradycardia). A biventricular pacemaker is a pulse generator connected by wires called leads that go into the two lower chambers on the right and left sides of your heart (ventricles). It is used to treat symptoms of heart failure. The pulse generator is a small computer run by a battery. The generator creates a regular electronic pulse. The pulse is sent through the leads, which go through a blood vessel and into the ventricles of your heart. This type of pacemaker makes a weak heart more efficient. LET [facility] CARE PROVIDER KNOW ABOUT:  Any allergies. Some allergies can cause serious problems during the procedure. Allergies to shellfish or agents, such as iodine, used in liquids that enhance specific areas of your body on X-ray images (contrast dyes) are especially problematic.   All medicines you take. These include vitamins, herbs, eye drops, over-the-counter medicines, and creams.   Use of steroids.   Problems with  numbing medicines (anesthetics).  Bleeding problems.   Past surgeries.   Other health problems. RISKS AND COMPLICATIONS Implanting a biventricular pacemaker is usually a safe procedure but problems can occur. For example:   Too much bleeding may occur.   Infection may develop.   Blood vessels, your lungs, or your heart may be harmed.   The pacemaker may not make your condition better. BEFORE THE PROCEDURE   You may need to have blood tests, heart tests, or a chest X-ray done before the day of the procedure.   Ask your health care provider about changing or stopping your regular medicines.   Make plans to have someone drive you home.  Stop smoking at least 24 hours before the procedure.  Take a bath or shower the night before the procedure. You may need to scrub your chest with a special type of soap.  Do not eat or drink anything after midnight the night before your procedure. Ask if it is okay to take any needed medicine with a small sip of water. PROCEDURE The procedure to put a pacemaker in your chest is usually done at a hospital in a room that has a large X-ray machine called a fluoroscope. The machine will be above you during the procedure. It will help your health care provider see your heart during the procedure. Before the procedure:   Small monitors will be put on your body. They will be used to check your heart, blood pressure, and oxygen level.  A needle will be put into a vein in your hand or arm. This is called an  intravenous (IV) access tube. Fluids and medicine will flow directly into your body through the IV tube.  Your chest will be cleaned with a germ-killing (antiseptic) solution. Your chest may be shaved.  You may be given medicine to help you relax (sedative).   You will be given a numbing medicine called a local anesthetic. This medicine will make your chest area have no feeling while the pacemaker is implanted. You will be sleepy but awake  during the procedure. After you are numb, the procedure will begin. The health care provider will:   Make a small cut (incision). This will make a pocket deep under your skin that will hold the pulse generator.  Guide the leads through a large blood vessel into your heart and attach them to the heart muscles.  Test the pacemaker.  Close the incision with stitches, glue, or staples. AFTER THE PROCEDURE  You may feel pain. Some pain is normal. It may last a few days.   You may stay in a recovery area until the local anesthetic has worn off. Your blood pressure and pulse will be checked often. You will be taken to a room where your heartbeat will be monitored.  A chest X-ray will be taken. This checks that the pacemaker is in the right place.  The pacemaker will be checked before you go home. It can be adjusted if that is needed. Document Released: 07/18/2012 Document Revised: 03/10/2014 Document Reviewed: 07/18/2012 [facility] Patient Information 2015 [place], [place]. This information is not intended to replace advice given to you by your health care provider. Make sure you discuss any questions you have with your health care provider.

## 2015-01-27 ENCOUNTER — Telehealth: Payer: Self-pay | Admitting: Internal Medicine

## 2015-01-27 ENCOUNTER — Other Ambulatory Visit: Payer: Self-pay

## 2015-01-27 DIAGNOSIS — I509 Heart failure, unspecified: Secondary | ICD-10-CM

## 2015-01-27 MED ORDER — CARVEDILOL 3.125 MG PO TABS: 3.1250 mg | ORAL_TABLET | Freq: Two times a day (BID) | ORAL | Status: DC

## 2015-01-27 NOTE — Telephone Encounter (Signed)
Reviewed instructions, from yesterday's office visit, for patient to start Coreg and decrease Aldactone. She verbalized understanding and just wanted to make sure she understood directions.

## 2015-01-27 NOTE — Telephone Encounter (Signed)
New message      Pt was seen yesterday.  She cannot remember if she is to continue all of her other medications when she starts the coreg.  Please call

## 2015-01-28 ENCOUNTER — Telehealth: Payer: Self-pay | Admitting: Internal Medicine

## 2015-01-28 NOTE — Telephone Encounter (Signed)
New Msg      Pt c/o medication issue:  1. Name of Medication: Aspirin  2. How are you currently taking this medication (dosage and times per day)? 1800 mg  3. Are you having a reaction (difficulty breathing--STAT)? no  4. What is your medication issue? Pt would like to know if she should continue taking aspirin?   Please return pt call.

## 2015-01-28 NOTE — Telephone Encounter (Signed)
Patient is taking Aspirin 81 mg daily. Advised patient she does not need to stop this medication before her procedure on 4/6. Patient verbalized understanding and agreeable to plan.

## 2015-02-02 ENCOUNTER — Telehealth: Payer: Self-pay | Admitting: Internal Medicine

## 2015-02-04 ENCOUNTER — Emergency Department
Admit: 2015-02-04 | Disposition: A | Discharge status: Home / Self Care | Payer: Self-pay | Admitting: Emergency Medicine

## 2015-02-04 LAB — CBC
HCT: 35.2 % (ref 35.0–47.0)
HGB: 11.7 g/dL — ABNORMAL LOW (ref 12.0–16.0)
MCH: 30.6 pg (ref 26.0–34.0)
MCHC: 33.3 g/dL (ref 32.0–36.0)
MCV: 92 fL (ref 80–100)
PLATELETS: 114 10*3/uL — AB (ref 150–440)
RBC: 3.83 10*6/uL (ref 3.80–5.20)
RDW: 13.2 % (ref 11.5–14.5)
WBC: 3.6 10*3/uL (ref 3.6–11.0)

## 2015-02-04 LAB — TROPONIN I: Troponin-I: <0.03 ng/mL

## 2015-02-04 LAB — BASIC METABOLIC PANEL
Anion Gap: 8 (ref 7–16)
BUN: 11 mg/dL
CHLORIDE: 106 mmol/L
Calcium, Total: 9.2 mg/dL
Co2: 26 mmol/L
Creatinine: 0.7 mg/dL
EGFR (Non-African Amer.): >60
Glucose: 179 mg/dL — ABNORMAL HIGH
Potassium: 4.1 mmol/L
Sodium: 140 mmol/L

## 2015-02-04 LAB — PRO B NATRIURETIC PEPTIDE: B-Type Natriuretic Peptide: 677 pg/mL — ABNORMAL HIGH

## 2015-02-05 ENCOUNTER — Other Ambulatory Visit (INDEPENDENT_AMBULATORY_CARE_PROVIDER_SITE_OTHER): Payer: Medicare PPO | Admitting: *Deleted

## 2015-02-05 ENCOUNTER — Other Ambulatory Visit: Payer: Self-pay | Admitting: *Deleted

## 2015-02-05 DIAGNOSIS — I5042 Chronic combined systolic (congestive) and diastolic (congestive) heart failure: Secondary | ICD-10-CM

## 2015-02-05 DIAGNOSIS — Z01812 Encounter for preprocedural laboratory examination: Secondary | ICD-10-CM

## 2015-02-05 LAB — CBC WITH DIFFERENTIAL/PLATELET
BASOS PCT: 0.7 % (ref 0.0–3.0)
Basophils Absolute: 0 10*3/uL (ref 0.0–0.1)
Eosinophils Absolute: 0.1 10*3/uL (ref 0.0–0.7)
Eosinophils Relative: 4.3 % (ref 0.0–5.0)
HCT: 37.3 % (ref 36.0–46.0)
Hemoglobin: 12.5 g/dL (ref 12.0–15.0)
LYMPHS ABS: 1.2 10*3/uL (ref 0.7–4.0)
LYMPHS PCT: 45.6 % (ref 12.0–46.0)
MCHC: 33.4 g/dL (ref 30.0–36.0)
MCV: 90.7 fl (ref 78.0–100.0)
Monocytes Absolute: 0.3 10*3/uL (ref 0.1–1.0)
Monocytes Relative: 11.1 % (ref 3.0–12.0)
NEUTROS PCT: 38.3 % — AB (ref 43.0–77.0)
Neutro Abs: 1 10*3/uL — ABNORMAL LOW (ref 1.4–7.7)
Platelets: 128 10*3/uL — ABNORMAL LOW (ref 150.0–400.0)
RBC: 4.12 Mil/uL (ref 3.87–5.11)
RDW: 13.9 % (ref 11.5–15.5)
WBC: 2.7 10*3/uL — AB (ref 4.0–10.5)

## 2015-02-05 LAB — BASIC METABOLIC PANEL
BUN: 11 mg/dL (ref 6–23)
CO2: 29 mEq/L (ref 19–32)
Calcium: 9.6 mg/dL (ref 8.4–10.5)
Chloride: 105 mEq/L (ref 96–112)
Creatinine, Ser: 0.82 mg/dL (ref 0.40–1.20)
GFR: 88.96 mL/min (ref >60.00)
Glucose, Bld: 127 mg/dL — ABNORMAL HIGH (ref 70–99)
POTASSIUM: 4 meq/L (ref 3.5–5.1)
SODIUM: 139 meq/L (ref 135–145)

## 2015-02-05 NOTE — Addendum Note (Signed)
Addended by: Armen Pickup T on: 02/05/2015 10:34 AM   Modules accepted: Orders

## 2015-02-05 NOTE — Addendum Note (Signed)
Addended by: Armen Pickup T on: 02/05/2015 10:31 AM   Modules accepted: Orders

## 2015-02-05 NOTE — Addendum Note (Signed)
Addended by: Armen Pickup T on: 02/05/2015 10:28 AM   Modules accepted: Orders

## 2015-02-06 ENCOUNTER — Encounter: Payer: Self-pay | Admitting: Internal Medicine

## 2015-02-10 DIAGNOSIS — K219 Gastro-esophageal reflux disease without esophagitis: Secondary | ICD-10-CM | POA: Diagnosis not present

## 2015-02-10 DIAGNOSIS — I5022 Chronic systolic (congestive) heart failure: Secondary | ICD-10-CM | POA: Diagnosis not present

## 2015-02-10 DIAGNOSIS — E785 Hyperlipidemia, unspecified: Secondary | ICD-10-CM | POA: Diagnosis not present

## 2015-02-10 DIAGNOSIS — Z7982 Long term (current) use of aspirin: Secondary | ICD-10-CM | POA: Diagnosis not present

## 2015-02-10 DIAGNOSIS — E119 Type 2 diabetes mellitus without complications: Secondary | ICD-10-CM | POA: Diagnosis not present

## 2015-02-10 DIAGNOSIS — I952 Hypotension due to drugs: Secondary | ICD-10-CM | POA: Diagnosis not present

## 2015-02-10 DIAGNOSIS — I420 Dilated cardiomyopathy: Secondary | ICD-10-CM | POA: Diagnosis present

## 2015-02-10 DIAGNOSIS — K5790 Diverticulosis of intestine, part unspecified, without perforation or abscess without bleeding: Secondary | ICD-10-CM | POA: Diagnosis not present

## 2015-02-10 DIAGNOSIS — I10 Essential (primary) hypertension: Secondary | ICD-10-CM | POA: Diagnosis not present

## 2015-02-10 DIAGNOSIS — T82190A Other mechanical complication of cardiac electrode, initial encounter: Secondary | ICD-10-CM | POA: Diagnosis not present

## 2015-02-10 DIAGNOSIS — I38 Endocarditis, valve unspecified: Secondary | ICD-10-CM | POA: Diagnosis not present

## 2015-02-10 DIAGNOSIS — I340 Nonrheumatic mitral (valve) insufficiency: Secondary | ICD-10-CM | POA: Diagnosis not present

## 2015-02-10 DIAGNOSIS — I447 Left bundle-branch block, unspecified: Secondary | ICD-10-CM | POA: Diagnosis not present

## 2015-02-10 DIAGNOSIS — E079 Disorder of thyroid, unspecified: Secondary | ICD-10-CM | POA: Diagnosis not present

## 2015-02-10 DIAGNOSIS — Y712 Prosthetic and other implants, materials and accessory cardiovascular devices associated with adverse incidents: Secondary | ICD-10-CM | POA: Diagnosis not present

## 2015-02-10 DIAGNOSIS — Z79899 Other long term (current) drug therapy: Secondary | ICD-10-CM | POA: Diagnosis not present

## 2015-02-10 MED ORDER — SODIUM CHLORIDE 0.9 % IV SOLN
INTRAVENOUS | Status: DC
  Administered 2015-02-11: 07:00:00 via INTRAVENOUS

## 2015-02-10 MED ORDER — SODIUM CHLORIDE 0.9 % IR SOLN
80.0000 mg | Status: DC
  Filled 2015-02-10: qty 2

## 2015-02-10 MED ORDER — CEFAZOLIN SODIUM-DEXTROSE 2-3 GM-% IV SOLR: 2.0000 g | INTRAVENOUS | Status: DC

## 2015-02-10 MED ORDER — MUPIROCIN 2 % EX OINT
TOPICAL_OINTMENT | Freq: Two times a day (BID) | CUTANEOUS | Status: DC
  Administered 2015-02-11: 07:00:00 via NASAL
  Filled 2015-02-10: qty 22

## 2015-02-11 ENCOUNTER — Ambulatory Visit (HOSPITAL_COMMUNITY)
Admission: RE | Admit: 2015-02-11 | Discharge: 2015-02-12 | Disposition: A | Discharge status: Home / Self Care | Payer: Medicare PPO | Source: Ambulatory Visit | Attending: Internal Medicine | Admitting: Internal Medicine

## 2015-02-11 ENCOUNTER — Encounter (HOSPITAL_COMMUNITY): Payer: Self-pay | Admitting: Internal Medicine

## 2015-02-11 ENCOUNTER — Encounter (HOSPITAL_COMMUNITY)
Admission: RE | Disposition: A | Discharge status: Home / Self Care | Payer: Self-pay | Source: Ambulatory Visit | Attending: Internal Medicine

## 2015-02-11 ENCOUNTER — Other Ambulatory Visit: Payer: Self-pay

## 2015-02-11 DIAGNOSIS — Z959 Presence of cardiac and vascular implant and graft, unspecified: Secondary | ICD-10-CM

## 2015-02-11 DIAGNOSIS — T82190A Other mechanical complication of cardiac electrode, initial encounter: Secondary | ICD-10-CM | POA: Insufficient documentation

## 2015-02-11 DIAGNOSIS — I428 Other cardiomyopathies: Secondary | ICD-10-CM

## 2015-02-11 DIAGNOSIS — Z7982 Long term (current) use of aspirin: Secondary | ICD-10-CM | POA: Insufficient documentation

## 2015-02-11 DIAGNOSIS — I509 Heart failure, unspecified: Secondary | ICD-10-CM | POA: Diagnosis not present

## 2015-02-11 DIAGNOSIS — I10 Essential (primary) hypertension: Secondary | ICD-10-CM | POA: Insufficient documentation

## 2015-02-11 DIAGNOSIS — K219 Gastro-esophageal reflux disease without esophagitis: Secondary | ICD-10-CM | POA: Insufficient documentation

## 2015-02-11 DIAGNOSIS — E079 Disorder of thyroid, unspecified: Secondary | ICD-10-CM | POA: Insufficient documentation

## 2015-02-11 DIAGNOSIS — E785 Hyperlipidemia, unspecified: Secondary | ICD-10-CM | POA: Insufficient documentation

## 2015-02-11 DIAGNOSIS — I5022 Chronic systolic (congestive) heart failure: Secondary | ICD-10-CM | POA: Diagnosis present

## 2015-02-11 DIAGNOSIS — I952 Hypotension due to drugs: Secondary | ICD-10-CM | POA: Insufficient documentation

## 2015-02-11 DIAGNOSIS — E119 Type 2 diabetes mellitus without complications: Secondary | ICD-10-CM | POA: Insufficient documentation

## 2015-02-11 DIAGNOSIS — K5790 Diverticulosis of intestine, part unspecified, without perforation or abscess without bleeding: Secondary | ICD-10-CM | POA: Insufficient documentation

## 2015-02-11 DIAGNOSIS — I447 Left bundle-branch block, unspecified: Secondary | ICD-10-CM | POA: Insufficient documentation

## 2015-02-11 DIAGNOSIS — I420 Dilated cardiomyopathy: Secondary | ICD-10-CM | POA: Insufficient documentation

## 2015-02-11 DIAGNOSIS — Z79899 Other long term (current) drug therapy: Secondary | ICD-10-CM | POA: Insufficient documentation

## 2015-02-11 DIAGNOSIS — Y712 Prosthetic and other implants, materials and accessory cardiovascular devices associated with adverse incidents: Secondary | ICD-10-CM | POA: Insufficient documentation

## 2015-02-11 DIAGNOSIS — I340 Nonrheumatic mitral (valve) insufficiency: Secondary | ICD-10-CM | POA: Insufficient documentation

## 2015-02-11 DIAGNOSIS — I313 Pericardial effusion (noninflammatory): Secondary | ICD-10-CM | POA: Diagnosis not present

## 2015-02-11 DIAGNOSIS — I38 Endocarditis, valve unspecified: Secondary | ICD-10-CM | POA: Insufficient documentation

## 2015-02-11 DIAGNOSIS — I429 Cardiomyopathy, unspecified: Secondary | ICD-10-CM | POA: Diagnosis not present

## 2015-02-11 HISTORY — PX: BI-VENTRICULAR IMPLANTABLE CARDIOVERTER DEFIBRILLATOR: SHX5459

## 2015-02-11 HISTORY — PX: LEAD REVISION: SHX5945

## 2015-02-11 LAB — NO BLOOD PRODUCTS

## 2015-02-11 LAB — SURGICAL PCR SCREEN
MRSA, PCR: NEGATIVE
Staphylococcus aureus: NEGATIVE

## 2015-02-11 LAB — GLUCOSE, CAPILLARY
GLUCOSE-CAPILLARY: 113 mg/dL — AB (ref 70–99)
GLUCOSE-CAPILLARY: 196 mg/dL — AB (ref 70–99)
GLUCOSE-CAPILLARY: 196 mg/dL — AB (ref 70–99)
Glucose-Capillary: 109 mg/dL — ABNORMAL HIGH (ref 70–99)

## 2015-02-11 SURGERY — LEAD REVISION
Anesthesia: LOCAL

## 2015-02-11 SURGERY — BI-VENTRICULAR IMPLANTABLE CARDIOVERTER DEFIBRILLATOR  (CRT-D)

## 2015-02-11 MED ORDER — FENTANYL CITRATE 0.05 MG/ML IJ SOLN
INTRAMUSCULAR | Status: AC
  Filled 2015-02-11: qty 2

## 2015-02-11 MED ORDER — SENNA 8.6 MG PO TABS
1.0000 | ORAL_TABLET | Freq: Every day | ORAL | Status: DC | PRN
  Filled 2015-02-11: qty 1

## 2015-02-11 MED ORDER — CEFAZOLIN SODIUM-DEXTROSE 2-3 GM-% IV SOLR
INTRAVENOUS | Status: AC
  Filled 2015-02-11: qty 50

## 2015-02-11 MED ORDER — MIDAZOLAM HCL 5 MG/5ML IJ SOLN
INTRAMUSCULAR | Status: AC
  Filled 2015-02-11: qty 5

## 2015-02-11 MED ORDER — SODIUM CHLORIDE 0.9 % IV SOLN: INTRAVENOUS | Status: AC

## 2015-02-11 MED ORDER — CARVEDILOL 3.125 MG PO TABS
3.1250 mg | ORAL_TABLET | Freq: Two times a day (BID) | ORAL | Status: DC
  Administered 2015-02-12 (×2): 3.125 mg via ORAL
  Filled 2015-02-11 (×3): qty 1

## 2015-02-11 MED ORDER — CEFAZOLIN SODIUM-DEXTROSE 2-3 GM-% IV SOLR: 2.0000 g | INTRAVENOUS | Status: DC

## 2015-02-11 MED ORDER — ACETAMINOPHEN 500 MG PO TABS: 1000.0000 mg | ORAL_TABLET | Freq: Two times a day (BID) | ORAL | Status: DC | PRN

## 2015-02-11 MED ORDER — SODIUM CHLORIDE 0.9 % IV SOLN
INTRAVENOUS | Status: DC
  Administered 2015-02-11: 23:00:00 via INTRAVENOUS

## 2015-02-11 MED ORDER — CARVEDILOL 3.125 MG PO TABS: 3.1250 mg | ORAL_TABLET | Freq: Two times a day (BID) | ORAL | Status: DC

## 2015-02-11 MED ORDER — DEXTROSE-NACL 5-0.45 % IV SOLN
INTRAVENOUS | Status: DC
  Administered 2015-02-11: 15:00:00 via INTRAVENOUS

## 2015-02-11 MED ORDER — SODIUM CHLORIDE 0.9 % IR SOLN
Freq: Once | Status: DC
  Filled 2015-02-11: qty 2

## 2015-02-11 MED ORDER — MUPIROCIN 2 % EX OINT
TOPICAL_OINTMENT | CUTANEOUS | Status: AC
  Filled 2015-02-11: qty 22

## 2015-02-11 MED ORDER — PANTOPRAZOLE SODIUM 40 MG PO TBEC
40.0000 mg | DELAYED_RELEASE_TABLET | Freq: Every day | ORAL | Status: DC
  Administered 2015-02-11 – 2015-02-12 (×2): 40 mg via ORAL
  Filled 2015-02-11 (×2): qty 1

## 2015-02-11 MED ORDER — ONDANSETRON HCL 4 MG/2ML IJ SOLN
4.0000 mg | Freq: Four times a day (QID) | INTRAMUSCULAR | Status: DC | PRN
  Administered 2015-02-11: 4 mg via INTRAVENOUS

## 2015-02-11 MED ORDER — CEFAZOLIN SODIUM 1-5 GM-% IV SOLN
1.0000 g | Freq: Four times a day (QID) | INTRAVENOUS | Status: DC
  Administered 2015-02-11: 1 g via INTRAVENOUS
  Filled 2015-02-11 (×3): qty 50

## 2015-02-11 MED ORDER — FERROUS SULFATE 325 (65 FE) MG PO TABS
325.0000 mg | ORAL_TABLET | Freq: Two times a day (BID) | ORAL | Status: DC
  Administered 2015-02-12 (×2): 325 mg via ORAL
  Filled 2015-02-11 (×3): qty 1

## 2015-02-11 MED ORDER — LIDOCAINE HCL (PF) 1 % IJ SOLN
INTRAMUSCULAR | Status: AC
  Filled 2015-02-11: qty 30

## 2015-02-11 MED ORDER — ACETAMINOPHEN 325 MG PO TABS: 325.0000 mg | ORAL_TABLET | ORAL | Status: DC | PRN

## 2015-02-11 MED ORDER — HEPARIN (PORCINE) IN NACL 2-0.9 UNIT/ML-% IJ SOLN
INTRAMUSCULAR | Status: AC
  Filled 2015-02-11: qty 500

## 2015-02-11 MED ORDER — ONDANSETRON HCL 4 MG/2ML IJ SOLN
INTRAMUSCULAR | Status: AC
  Filled 2015-02-11: qty 2

## 2015-02-11 MED ORDER — CEFAZOLIN SODIUM 1-5 GM-% IV SOLN
1.0000 g | Freq: Four times a day (QID) | INTRAVENOUS | Status: AC
  Administered 2015-02-11 – 2015-02-12 (×3): 1 g via INTRAVENOUS
  Filled 2015-02-11 (×3): qty 50

## 2015-02-11 MED ORDER — DEXTROSE-NACL 5-0.45 % IV SOLN
INTRAVENOUS | Status: DC
  Administered 2015-02-11: 22:00:00 via INTRAVENOUS

## 2015-02-11 MED ORDER — FENTANYL CITRATE 0.05 MG/ML IJ SOLN
50.0000 ug | Freq: Once | INTRAMUSCULAR | Status: AC
  Administered 2015-02-11: 50 ug via INTRAVENOUS

## 2015-02-11 MED ORDER — ONDANSETRON HCL 4 MG/2ML IJ SOLN: 4.0000 mg | Freq: Four times a day (QID) | INTRAMUSCULAR | Status: DC | PRN

## 2015-02-11 MED ORDER — CEFAZOLIN SODIUM 1-5 GM-% IV SOLN
INTRAVENOUS | Status: AC
  Filled 2015-02-11: qty 50

## 2015-02-11 MED ORDER — ASPIRIN EC 81 MG PO TBEC
81.0000 mg | DELAYED_RELEASE_TABLET | Freq: Every day | ORAL | Status: DC
  Administered 2015-02-12: 81 mg via ORAL
  Filled 2015-02-11: qty 1

## 2015-02-11 MED ORDER — ACETAMINOPHEN 325 MG PO TABS
325.0000 mg | ORAL_TABLET | ORAL | Status: DC | PRN
  Administered 2015-02-11 – 2015-02-12 (×3): 650 mg via ORAL
  Filled 2015-02-11 (×3): qty 2

## 2015-02-11 MED ORDER — LIDOCAINE HCL (PF) 1 % IJ SOLN
INTRAMUSCULAR | Status: AC
  Filled 2015-02-11: qty 60

## 2015-02-11 MED ORDER — LEVOTHYROXINE SODIUM 50 MCG PO TABS
50.0000 ug | ORAL_TABLET | Freq: Every day | ORAL | Status: DC
  Administered 2015-02-12: 50 ug via ORAL
  Filled 2015-02-11 (×2): qty 1

## 2015-02-11 MED ORDER — SPIRONOLACTONE 12.5 MG HALF TABLET
12.5000 mg | ORAL_TABLET | Freq: Every day | ORAL | Status: DC
  Administered 2015-02-11: 12.5 mg via ORAL
  Filled 2015-02-11 (×2): qty 1

## 2015-02-11 MED ORDER — SODIUM CHLORIDE 0.9 % IR SOLN: 80.0000 mg | Status: DC

## 2015-02-11 MED ORDER — ESCITALOPRAM OXALATE 10 MG PO TABS
10.0000 mg | ORAL_TABLET | Freq: Every day | ORAL | Status: DC
  Administered 2015-02-11 – 2015-02-12 (×2): 10 mg via ORAL
  Filled 2015-02-11 (×2): qty 1

## 2015-02-11 MED ORDER — METFORMIN HCL 500 MG PO TABS
500.0000 mg | ORAL_TABLET | Freq: Two times a day (BID) | ORAL | Status: DC
  Administered 2015-02-12 (×2): 500 mg via ORAL
  Filled 2015-02-11 (×3): qty 1

## 2015-02-11 MED ORDER — FUROSEMIDE 20 MG PO TABS
20.0000 mg | ORAL_TABLET | Freq: Every day | ORAL | Status: DC
  Administered 2015-02-12: 20 mg via ORAL
  Filled 2015-02-11: qty 1

## 2015-02-11 MED ORDER — METOPROLOL TARTRATE 12.5 MG HALF TABLET
12.5000 mg | ORAL_TABLET | Freq: Two times a day (BID) | ORAL | Status: DC
  Administered 2015-02-11 – 2015-02-12 (×2): 12.5 mg via ORAL
  Filled 2015-02-11 (×3): qty 1

## 2015-02-11 NOTE — H&P (View-Only) (Signed)
ELECTROPHYSIOLOGY CONSULT NOTE  Patient ID: Bethany [name], MRN: [id number], DOB/AGE: 1946-03-13 69 y.o. Admit date: (Not on file) Date of Consult: 01/26/2015  Primary Physician: [name], MD Primary Cardiologist: BK   Chief Complaint: Defibrillator   HPI Bethany [name] is a 69 y.o. female  He is referred for consideration of a CRT-D implantation.  She has a history of a nonischemic cardiomyopathy; she is undergoing catheterization 2012 demonstrating normal coronary arteries; echocardiogram at that point demonstrated ejection fraction of 20%. (This is all obtained from reviewing records from [facility].) She also underwent echocardiogram 3/16 by Dr. [name] at [facility]. Ejection fraction was 15-20%.   She has chronic systolic heart failure; she has been managed with metoprolol tartrate, Aldactone 3 days a week; she has not been on an ACE inhibitor.  She's had significant issues with low blood pressure minimizing the ability to adjust her heart failure medications.  She has nocturnal dyspnea and some orthopnea. She has mild peripheral edema.  She's had some palpitations but no syncope or presyncope.  Past Medical History  Diagnosis Date  . Diabetes   . Hypertension   . Hyperlipidemia   . Reflux   . Diverticulosis   . Thrombocytopenia   . CHF (congestive heart failure)   . LV dysfunction   . VHD (valvular heart disease)   . Dilated cardiomyopathy       Surgical History:  Past Surgical History  Procedure Laterality Date  . Cardiac catheterization       Home Meds: Prior to Admission medications   Medication Sig Start Date End Date Taking? Authorizing Provider  escitalopram (LEXAPRO) 10 MG tablet Take 1 tablet by mouth daily. 01/02/15  Yes Historical Provider, MD  furosemide (LASIX) 20 MG tablet Take 1 tablet by mouth daily. 09/22/14 09/22/15 Yes Historical Provider, MD  IRON PO Take 1 tablet by mouth daily.   Yes Historical Provider, MD  levothyroxine (SYNTHROID, LEVOTHROID) 50  MCG tablet Take 1 tablet by mouth daily.   Yes Historical Provider, MD  metFORMIN (GLUCOPHAGE) 500 MG tablet Take 1 tablet by mouth 2 (two) times daily. 06/12/14  Yes Historical Provider, MD  metoprolol tartrate (LOPRESSOR) 25 MG tablet Take 12.5 mg by mouth 2 (two) times daily.   Yes Historical Provider, MD  pantoprazole (PROTONIX) 40 MG tablet Take 1 tablet by mouth daily.   Yes Historical Provider, MD  spironolactone (ALDACTONE) 25 MG tablet Take 1 tablet by mouth 3 (three) times a week.   Yes Historical Provider, MD      Allergies:  Allergies  Allergen Reactions  . Other Rash    SKIN RASHES/HIVES    History   Social History  . Marital Status: Divorced    Spouse Name: [name]  . Number of Children: [name]  . Years of Education: [name]   Occupational History  . Not on file.   Social History Main Topics  . Smoking status: Never Smoker   . Smokeless tobacco: Not on file  . Alcohol Use: No  . Drug Use: No  . Sexual Activity: Not on file   Other Topics Concern  . Not on file   Social History Narrative     Family History  Problem Relation Age of Onset  . Heart disease Mother   . Heart attack Mother   . Prostate cancer Father   . Ulcers Father   . Stroke Sister   . Diabetes Sister      ROS:  Please see the history of present  illness.     All other systems reviewed and negative.    Physical Exam:   Blood pressure 94/50, pulse 66, height  (1.549 m), weight 117 lb 9.6 oz (53.343 kg). General: Well developed, well nourished female in no acute distress. Head: Normocephalic, atraumatic, sclera non-icteric, no xanthomas, nares are without discharge. EENT: normal Lymph Nodes:  none Back: without scoliosis/kyphosis , no CVA tendersness Neck: Negative for carotid bruits. JVD not elevated. Lungs: Clear bilaterally to auscultation without wheezes, rales, or rhonchi. Breathing is unlabored. Heart: RRR with S1 S2.   Earl;y 3/6 systolic   murmur , rubs, or gallops  appreciated. Abdomen: Soft, non-tender, non-distended with normoactive bowel sounds. No hepatomegaly. No rebound/guarding. No obvious abdominal masses. Msk:  Strength and tone appear normal for age. Extremities: No clubbing or cyanosis. No  edema.  Distal pedal pulses are 2+ and equal bilaterally. Skin: Warm and Dry Neuro: Alert and oriented X 3. CN III-XII intact Grossly normal sensory and motor function . Psych:  Responds to questions appropriately with a normal affect.      Labs: Cardiac Enzymes No results for input(s): CKTOTAL, CKMB, TROPONINI in the last 72 hours. CBC   Miscellaneous No results found for: DDIMER  Radiology/Studies:  No results found.  EKG:  NSR  66 14/15/46 Left bundle branch block Axis 115     Assessment and Plan:   NICM  CHF class 2B3A  LBBB  Hypotension  The patient has persistent left ventricular dysfunction and maximally titrated guidelines directed therapy limited by hypotension. It is appropriate to consider ICD implantation for primary prevention. She also has left bundle branch block with a QRS duration of 150 ms and so CRT implantation at the time of ICD is also appropriate given her nonischemic cardiac myopathy.  Have reviewed the potential benefits and risks of ICD implantation including but not limited to death, perforation of heart or lung, lead dislodgement, infection,  device malfunction and inappropriate shocks.  We also discussed the possibility of lack of response especially given the right axis deviation associated with her left bundle. The patient and family express understanding  and are willing to proceed.    Based on guidelines in the COMET Data  also change metoprolol to carvedilol and we'll have her take her Aldactone daily at a lower dose.     [name]   there

## 2015-02-11 NOTE — Progress Notes (Signed)
Nausea gone. Pain 7/10-medicated per order.

## 2015-02-11 NOTE — Progress Notes (Signed)
To procedure lab.

## 2015-02-11 NOTE — Progress Notes (Signed)
Nauseated. Dr. Graciela Husbands made aware.

## 2015-02-11 NOTE — Progress Notes (Signed)
Pain less. Sleeping.

## 2015-02-11 NOTE — Progress Notes (Signed)
Arrived to holding area with sling on left arm. Waiting for tele bed assignment.

## 2015-02-11 NOTE — Progress Notes (Signed)
CTSP for pleuritic chest pain,notable in the epigastrium  This started about an hour ago  There is no dyspnea and no rub;   BP and HR are stable 96/59 HR perhaps a lttlle faster 75>>85  i am concerned that this is caused by microperforation presumably based on location by the RV lead  If does not abate, will plan to reposition surgically this afternoon  Will get stat echo Fentanyl for pain resume IV fluids

## 2015-02-11 NOTE — Interval H&P Note (Signed)
ICD Criteria  Current LVEF:15% ;Obtained < 1 month ago.  NYHA Functional Classification: Class III  Heart Failure History:  Yes, Duration of heart failure since onset is > 9 months  Non-Ischemic Dilated Cardiomyopathy History:  Yes, timeframe is > 9 months  Atrial Fibrillation/Atrial Flutter:  No.  Ventricular Tachycardia History:  No.  Cardiac Arrest History:  No  History of Syndromes with Risk of Sudden Death:  No.  Previous ICD:  No.  Electrophysiology Study: No.  Prior MI: No.  PPM: No.  OSA:  No  Patient Life Expectancy of >=1 year: Yes.  Anticoagulation Therapy:  Patient is NOT on anticoagulation therapy.   Beta Blocker Therapy:  Yes.   Ace Inhibitor/ARB Therapy:  No, Reason not on Ace Inhibitor/ARB therapy:  hypotensionHistory and Physical Interval Note:  02/11/2015 7:35 AM  Bethany [name]  has presented today for surgery, with the diagnosis of cm  The various methods of treatment have been discussed with the patient and family. After consideration of risks, benefits and other options for treatment, the patient has consented to  Procedure(s): BI-VENTRICULAR IMPLANTABLE CARDIOVERTER DEFIBRILLATOR  (CRT-D) ([name]) as a surgical intervention .  The patient's history has been reviewed, patient examined, no change in status, stable for surgery.  I have reviewed the patient's chart and labs.  Questions were answered to the patient's satisfaction.     [name]

## 2015-02-11 NOTE — CV Procedure (Signed)
Bethany [name] [id number]  [id number]  Preop JO:ACZY Postop Dx same/   Procedure:ICD implant CRT placement  Cx: None   EBL: Minimal    Dictation number [id number]  [name], MD 02/11/2015 10:00 AM

## 2015-02-11 NOTE — Progress Notes (Signed)
  Echocardiogram 2D Echocardiogram has been performed.  Leta Jungling M 02/11/2015, 3:31 PM

## 2015-02-11 NOTE — Progress Notes (Signed)
Bedside echo being done. Patient resting quietly. Dr. Graciela Husbands spoke w/patient's sister and daughter re taking patient back to procedure lab. Daughter signed consent.

## 2015-02-11 NOTE — CV Procedure (Signed)
Bethany [name] [id number]  [id number]  Preop Dx: RV lead microperforation Postop Dx same/   Procedure:RV lead repositioning  Cx: None   EBL: Minimal      Following informed consent the patient was brought to the electrophysiology laboratory in place of the fluoroscopic table in the supine position after routine prep and drape lidocaine was infiltrated in the region of the previous incision and carried down to later the device pocket using sharp dissection and electrocautery. The pocket was opened the device was freed up and was removed  The RV lead was freed up and repositioned   The pleuritic pain resolved  Echo showed no noticalbe change in effusion.  Interrogation of the repositioned ventricular lead   demonstrated an R wave of 14  millivolts., and impedance of 607 ohms, and a pacing threshold of 0.8olts at 0.29msec.    The other leads were stable and all were reattached to the device with  rv aMPLITUDE IMPEDANCE THRESHOLD RV 15.4   513  0.75/.4 LV --   361  2/0.8 RA 3.5   399  0.5/0.4 HV    50  The pocket was irrigated with antibiotic containing saline solution hemostasis was assured and the leads and the device were placed in the pocket. The wound was then closed in 3 layers in normal fashion.sTERI STRIPS WERE APPLIED  EBL minimal    The patient tolerated the procedure without apparent complication.  pLEURITIC PAIN RESOLVED  Some nausea will give zofran  Carlyle Dolly, MD 02/11/2015 7:03 PM

## 2015-02-11 NOTE — Progress Notes (Signed)
Reassessment   She is nauseated  BP 125  HR 90 most recently 120  JVP not discernible  Plan is lead reposition  Continue IV fluids

## 2015-02-11 NOTE — Progress Notes (Signed)
Patient states lower chest hurts when she takes a deep breath-across lower rib cage and surgical discomfort lt upper chest. NPO; had eaten approx 1/4th [place] sandwich and few bites of apple sauce. Dr. Graciela Husbands aware. NPO now.

## 2015-02-11 NOTE — Progress Notes (Signed)
Sisters X 3 and daughter in to visit. Patient resting quietly.

## 2015-02-12 ENCOUNTER — Ambulatory Visit (HOSPITAL_COMMUNITY): Payer: Medicare PPO

## 2015-02-12 ENCOUNTER — Encounter (HOSPITAL_COMMUNITY): Payer: Self-pay | Admitting: Internal Medicine

## 2015-02-12 DIAGNOSIS — I313 Pericardial effusion (noninflammatory): Secondary | ICD-10-CM | POA: Diagnosis not present

## 2015-02-12 DIAGNOSIS — I5022 Chronic systolic (congestive) heart failure: Secondary | ICD-10-CM

## 2015-02-12 DIAGNOSIS — I447 Left bundle-branch block, unspecified: Secondary | ICD-10-CM | POA: Diagnosis not present

## 2015-02-12 DIAGNOSIS — T82190A Other mechanical complication of cardiac electrode, initial encounter: Secondary | ICD-10-CM | POA: Diagnosis not present

## 2015-02-12 DIAGNOSIS — I420 Dilated cardiomyopathy: Secondary | ICD-10-CM | POA: Diagnosis not present

## 2015-02-12 DIAGNOSIS — I429 Cardiomyopathy, unspecified: Secondary | ICD-10-CM | POA: Diagnosis not present

## 2015-02-12 LAB — GLUCOSE, CAPILLARY
Glucose-Capillary: 102 mg/dL — ABNORMAL HIGH (ref 70–99)
Glucose-Capillary: 156 mg/dL — ABNORMAL HIGH (ref 70–99)

## 2015-02-12 MED ORDER — LOSARTAN POTASSIUM 25 MG PO TABS
25.0000 mg | ORAL_TABLET | Freq: Every day | ORAL | Status: DC
  Administered 2015-02-12: 25 mg via ORAL
  Filled 2015-02-12: qty 1

## 2015-02-12 MED ORDER — LOSARTAN POTASSIUM 25 MG PO TABS: 25.0000 mg | ORAL_TABLET | Freq: Every day | ORAL | Status: DC

## 2015-02-12 NOTE — Op Note (Signed)
NAMECAROLEA, Bethany [name] NO.:  [id number]  MEDICAL RECORD NO.:  [id number]  LOCATION:  2H12C                        FACILITY:  [facility]  PHYSICIAN:  Duke Salvia, MD, FACCDATE OF BIRTH:  [birth year]  DATE OF PROCEDURE:  02/11/2015 DATE OF DISCHARGE:                              OPERATIVE REPORT   PREOPERATIVE DIAGNOSES:  Nonischemic cardiomyopathy, left bundle-branch block, and congestive heart failure.  POSTOPERATIVE DIAGNOSES:  Nonischemic cardiomyopathy, left bundle-branch block, and congestive heart failure.  PROCEDURE:  Dual-chamber defibrillator implantation with left ventricular lead placement.  Following obtaining informed consent, the patient was brought to electrophysiology laboratory and placed on the fluoroscopic table in supine position.  After routine prep and drape, lidocaine was infiltrated in prepectoral subclavicular region.  Incision was made and carried down to the layer of the prepectoral fascia using sharp dissection and electrocautery.  A pocket was formed similarly. Hemostasis was obtained.  Thereafter, attention was turned to gain access to the extrathoracic left subclavian vein which was accomplished without difficulty without the aspiration or puncture of the artery.  Three separate venipunctures were accomplished.  Guidewires were placed and retained, and sequentially 8-French, 9.5-French, and 7-French sheaths were placed through which were passed sequentially a Medtronic 6935 active fixation single coil defibrillator lead, serial #[id number], and Medtronic MB2 coronary sinus cannulation catheter, and a Medtronic 5076 45-cm active fixation atrial lead, serial #[id number].  Under fluoroscopic guidance, the RV lead was manipulated to the apex with mild difficulty.  In this final location, the amplitude was 13 with a pacing impedance of 623, a threshold 0.4 V at 0.5 milliseconds. Current of injury was brisk, and there was no  diaphragmatic pacing at 10 V.  We then obtained access to the coronary sinus, and there was a single high lateral branch coursing over the lateral wall down to the posterior aspect of the heart.  This was targeted and a wire was deployed allowing for the passage of a Medtronic 4598 passive quadripolar LV lead, serial #[id number].  It was manipulated until the tip was between the junction of the mid and distal third.  In the proximal four-pole coil configuration, the impedance was 460 and threshold was less than 2 V at 0.5 milliseconds.  The deployment system was removed.  The lead was secured.  We then deployed the atrial lead described above into the atrial appendage with bipolar P-wave of 2.4 with a pace impedance of 581, a threshold 1.4 V at 0.5 milliseconds.  Current of threshold was 2.5 mA, and there was no diaphragmatic pacing at 10 V.  The current of injury was brisk.  This lead was secured to the prepectoral fascia.  The pocket was then copiously irrigated with antibiotic containing saline solution.  Hemostasis was assured.  The leads and pulse generator were placed and the pocket secured to the prepectoral fascia.  Surgicel was placed on the cephalad and medial portion of the pocket following irrigation with antibiotic containing saline solution.  The wound was then closed in 2 layers in normal fashion.  The wound was washed, dried, and a Dermabond dressing was applied.  Needle counts, sponge counts, and instrument counts were correct  at the end of the procedure according to the staff.  The patient tolerated the procedure without apparent complication.     Duke Salvia, MD, [facility]     SCK/MEDQ  D:  02/11/2015  T:  02/12/2015  Job:  [id number]

## 2015-02-12 NOTE — Progress Notes (Signed)
Pt given discharge instructions by Ardine Eng. Pt daughter awaiting for patient to leave. Pt taken out via wheelchair.

## 2015-02-12 NOTE — Discharge Summary (Signed)
Electrophysiology Discharge Summary  Patient ID: Bethany [name] MRN: [id number] DOB/AGE: Sep 01, 1946 69 y.o.   Electrophysiologist: Dr. Graciela Husbands  Admit date: 02/11/2015 Discharge date: 02/12/2015  Admission Diagnoses: Nonischemic Cardiomyopathy w/ LBBB  Discharge Diagnoses:  Active Problems:   Chronic systolic heart failure   NICM (nonischemic cardiomyopathy)   LBBB (left bundle branch block)   Discharged Condition: stable  Hospital Course: The patient is a 69 y/o female who presented to [facility] on 02/11/15 for elective ICD implantation for primary prevention of SCD, in the setting of nonischemic cardiomyopathy/ persistent LV dysfunction with EF of 15-20%, despite maximally titrated guidelines directed therapy, as well as a LBBB. The procedure was performed by Dr. Graciela Husbands. She underwent successful implantation of a Medtronic dual chamber defibrillator. She tolerated the procedure well, however during recovery she complained of pleuritic CP, notable in the epigastrium area. She had no dyspnea and no rub. Vital signs remained stable. She was evaluated by Dr. Graciela Husbands. There was concern regarding microperforation presumably based on location by the RV lead. A 2D echo was obtained and revealed a small, free-flowing pericardial effusion  circumferential to the heart. The fluid had no internal echoes.There was no evidence of hemodynamic compromise. She was taken back to the OR for lead repositioning. This was successful and her pain resolved.  Repeat 2D echo on 02/12/15 showed improvement with no significant pericardial effusion. CXR showed proper lead placement w/o evidence for pneumothorax. Device interogation revealed normal functioning. She was last seen and examined by Dr. Graciela Husbands who determined she was stable for discharge home. He recommended discontinuing aldactone and start losartan, 25 mg daily. Wound check f/u has been arranged for 02/23/15 at 3:30 pm.    Consults: None  Significant Diagnostic Studies:  2D  echo 02/11/15 Study Conclusions  - Pericardium, extracardiac: A small, free-flowing pericardial effusion was identified circumferential to the heart. The fluid had no internal echoes.There was no evidence of hemodynamic compromise.  Impressions:  - There is a small pericardial effusion. There is no evidence by echo of hemodynamic compromise.  2D echo 02/12/15 Study Conclusions  - Left ventricle: The cavity size was normal. Wall thickness was increased in a pattern of mild LVH. There was moderate concentric hypertrophy. Systolic function was severely reduced. The estimated ejection fraction was in the range of 15% to 20%. Wall motion was normal; there were no regional wall motion abnormalities. - Mitral valve: There was mild regurgitation.  Impressions:  - When compared to prior, there is no significant pericardial effusion.  CXR 02/12/15 Left subclavian AICD device has been placed. Tips of the leads are in the right atrium, right ventricle, and coronary sinus. The heart is moderately enlarged with a globular appearance. There is no ensuing pneumothorax. Normal vascularity. Left basilar opacity likely represents volume loss secondary to cardiomegaly. There is noted to be emphysema in the left supraclavicular region.   Treatments:  ICD Implantation (see OP note)  Discharge Exam: Blood pressure 104/58, pulse 75, temperature 99 F (37.2 C), temperature source Oral, resp. rate 16, height 5\' 1"  (1.549 m), weight 122 lb 9.2 oz (55.6 kg), SpO2 98 %.   Disposition:       Discharge Instructions    Diet - low sodium heart healthy    Complete by:  As directed      Diet - low sodium heart healthy    Complete by:  As directed      Increase activity slowly    Complete by:  As directed  Increase activity slowly    Complete by:  As directed             Medication List    STOP taking these medications        spironolactone 25 MG tablet  Commonly  known as:  ALDACTONE      TAKE these medications        acetaminophen 500 MG tablet  Commonly known as:  TYLENOL  Take 1,000 mg by mouth 2 (two) times daily as needed for mild pain or moderate pain.     aspirin EC 81 MG tablet  Take 81 mg by mouth daily.     carvedilol 3.125 MG tablet  Commonly known as:  COREG  Take 1 tablet (3.125 mg total) by mouth 2 (two) times daily with a meal.     escitalopram 10 MG tablet  Commonly known as:  LEXAPRO  Take 1 tablet by mouth daily.     ferrous sulfate 325 (65 FE) MG tablet  Take 325 mg by mouth 2 (two) times daily with a meal.     furosemide 20 MG tablet  Commonly known as:  LASIX  Take 1 tablet by mouth daily.     levothyroxine 50 MCG tablet  Commonly known as:  SYNTHROID, LEVOTHROID  Take 1 tablet by mouth daily.     losartan 25 MG tablet  Commonly known as:  COZAAR  Take 1 tablet (25 mg total) by mouth daily.     metFORMIN 500 MG tablet  Commonly known as:  GLUCOPHAGE  Take 1 tablet by mouth 2 (two) times daily.     metoprolol tartrate 25 MG tablet  Commonly known as:  LOPRESSOR  Take 12.5 mg by mouth 2 (two) times daily.     pantoprazole 40 MG tablet  Commonly known as:  PROTONIX  Take 1 tablet by mouth daily.     senna 8.6 MG Tabs tablet  Commonly known as:  SENOKOT  Take 1 tablet by mouth daily as needed for mild constipation.       Follow-up Information    Follow up with [facility] On 02/23/2015.   Why:  3:30 pm , For wound re-check, For suture removal   Contact information:   [address] [place] [address]       TIME SPENT ON DISCHARGE INCLUDING PHYSICIAN TIME: > 30 MINUTES  Signed: Robbie Lis 02/12/2015, 5:15 PM

## 2015-02-12 NOTE — Progress Notes (Signed)
Echocardiogram 2D Echocardiogram limited has been performed.  Bethany [name] 02/12/2015, 11:49 AM

## 2015-02-12 NOTE — Progress Notes (Addendum)
Patient now hypotensive at 80/38, but asymptomatic. Metoprolol given at 22:26 when BP was 114/60.  Cardiology notified and no new orders given. Will continue to monitor BP.

## 2015-02-12 NOTE — Discharge Instructions (Signed)
Supplemental Discharge Instructions for  Pacemaker/Defibrillator Patients  Activity No heavy lifting or vigorous activity with your left/right arm for 6 to 8 weeks.  Do not raise your left/right arm above your head for one week.  Gradually raise your affected arm as drawn below.                       02/13/15                   02/15/15                 02/17/15                02/19/15            NO DRIVING for  7 days   ; you may begin driving on     4/46/28   . WOUND CARE - Keep the wound area clean and dry.  Do not get this area wet for one week. No showers for one week; you may shower on      02/19/15      . - The tape/steri-strips on your wound will fall off; do not pull them off.  No bandage is needed on the site.  DO  NOT apply any creams, oils, or ointments to the wound area. - If you notice any drainage or discharge from the wound, any swelling or bruising at the site, or you develop a fever > 101? F after you are discharged home, call the office at once.  Special Instructions - You are still able to use cellular telephones; use the ear opposite the side where you have your pacemaker/defibrillator.  Avoid carrying your cellular phone near your device. - When traveling through airports, show security personnel your identification card to avoid being screened in the metal detectors.  Ask the security personnel to use the hand wand. - Avoid arc welding equipment, MRI testing (magnetic resonance imaging), TENS units (transcutaneous nerve stimulators).  Call the office for questions about other devices. - Avoid electrical appliances that are in poor condition or are not properly grounded. - Microwave ovens are safe to be near or to operate.  Additional information for defibrillator patients should your device go off: - If your device goes off ONCE and you feel fine afterward, notify the device clinic nurses. - If your device goes off ONCE and you do not feel well afterward, call 911. - If your  device goes off TWICE, call 911. - If your device goes off THREE times in one day, call 911.  DO NOT DRIVE YOURSELF OR A FAMILY MEMBER WITH A DEFIBRILLATOR TO THE HOSPITAL--CALL 911.

## 2015-02-12 NOTE — Progress Notes (Signed)
Patient Name: Bethany [name]      SUBJECTIVE: feels much better no pleuritic pain today no sob  Past Medical History  Diagnosis Date  . Diabetes   . Hypertension   . Hyperlipidemia   . Reflux   . Diverticulosis   . Thrombocytopenia   . CHF (congestive heart failure)   . LV dysfunction   . VHD (valvular heart disease)   . Dilated cardiomyopathy   . Thyroid disease     Scheduled Meds:  Scheduled Meds: . aspirin EC  81 mg Oral Daily  . carvedilol  3.125 mg Oral BID WC  .  ceFAZolin (ANCEF) IV  1 g Intravenous Q6H  . escitalopram  10 mg Oral Daily  . ferrous sulfate  325 mg Oral BID WC  . furosemide  20 mg Oral Daily  . levothyroxine  50 mcg Oral Daily  . metFORMIN  500 mg Oral BID  . metoprolol tartrate  12.5 mg Oral BID  . pantoprazole  40 mg Oral Daily  . spironolactone  12.5 mg Oral Daily   Continuous Infusions: . sodium chloride 75 mL/hr at 02/11/15 2315   acetaminophen, ondansetron (ZOFRAN) IV, senna    PHYSICAL EXAM Filed Vitals:   02/12/15 0300 02/12/15 0400 02/12/15 0500 02/12/15 0600  BP: 81/42  Pulse: 80 76 74 75  Temp:  98 F (36.7 C)    TempSrc:  Oral    Resp: Height:      Weight:      SpO2: 95% 96% 96% 96%    Well developed and nourished in no acute distress HENT normal Neck supple with JVP-flat Clear Regular rate and rhythm, 2/6 murmur Abd-soft with active BS No Clubbing cyanosis edema Skin-warm and dry A & Oriented  Grossly normal sensory and motor function   TELEMETRY: Reviewed telemetry pt in P-synchronous/ AV  pacing    Intake/Output Summary (Last 24 hours) at 02/12/15 0721 Last data filed at 02/12/15 0600  Gross per 24 hour  Intake 606.25 ml  Output    450 ml  Net 156.25 ml    LABS: Basic Metabolic Panel:  Recent Labs Lab 02/05/15 1034  NA 139  K 4.0  CL 105  CO2 29  GLUCOSE 127*  BUN 11  CREATININE 0.82  CALCIUM 9.6   Cardiac Enzymes: No results for input(s):  CKTOTAL, CKMB, CKMBINDEX, TROPONINI in the last 72 hours. CBC:  Recent Labs Lab 02/05/15 1034  WBC 2.7*  NEUTROABS 1.0*  HGB 12.5  HCT 37.3  MCV 90.7  PLT 128.0*   PROTIME: No results for input(s): LABPROT, INR in the last 72 hours. Liver Function Tests: No results for input(s): AST, ALT, ALKPHOS, BILITOT, PROT, ALBUMIN in the last 72 hours. No results for input(s): LIPASE, AMYLASE in the last 72 hours. BNP: BNP (last 3 results) No results for input(s): BNP in the last 8760 hours.  ProBNP (last 3 results) No results for input(s): PROBNP in the last 8760 hours.  D-Dimer: No results for input(s): DDIMER in the last 72 hours. Hemoglobin A1C: No results for input(s): HGBA1C in the last 72 hours. Fasting Lipid Panel: No results for input(s): CHOL, HDL, LDLCALC, TRIG, CHOLHDL, LDLDIRECT in the last 72 hours. Thyroid Function Tests: No results for input(s): TSH, T4TOTAL, T3FREE, THYROIDAB in the last 72 hours.  Invalid input(s): FREET3 Anemia Panel: No results for input(s): VITAMINB12, FOLATE, FERRITIN, TIBC, IRON, RETICCTPCT in the last 72 hours.  Device Interrogation: normla device function  ASSESSMENT AND PLAN:  Active Problems:   Chronic systolic heart failure   NICM (nonischemic cardiomyopathy)   LBBB (left bundle branch block) microperofation  Hypotension chronic  Much improved   Will repeat echo  Ambulate  Home today Change aldactone to losartan 25    Signed, [name] MD  02/12/2015

## 2015-02-20 ENCOUNTER — Telehealth: Payer: Self-pay | Admitting: Internal Medicine

## 2015-02-20 NOTE — Telephone Encounter (Signed)
New message      1. Has your device fired? no 2. Is you device beeping? no 3. Are you experiencing draining or swelling at device site? no 4. Are you calling to see if we received your device transmission? no 5. Have you passed out? no Pt had a ICD implanted 02-11-15.  Now, she has pain in her left arm---lasting 10-27min.  Pt took tylenol for pain--it helped.  Pain is in the muscle.  No chest pain or sob

## 2015-02-20 NOTE — Telephone Encounter (Signed)
Spoke w/pt--no swelling and wound looks good. Offered pt appointment for 02-23-15 but declined. Pt has appt on 02-25-15 and just wants to come in that day.

## 2015-02-23 ENCOUNTER — Ambulatory Visit: Payer: Medicare PPO

## 2015-02-25 ENCOUNTER — Ambulatory Visit (INDEPENDENT_AMBULATORY_CARE_PROVIDER_SITE_OTHER): Payer: Medicare PPO | Admitting: *Deleted

## 2015-02-25 DIAGNOSIS — Z95810 Presence of automatic (implantable) cardiac defibrillator: Secondary | ICD-10-CM | POA: Diagnosis not present

## 2015-02-25 DIAGNOSIS — I5022 Chronic systolic (congestive) heart failure: Secondary | ICD-10-CM

## 2015-02-25 DIAGNOSIS — I447 Left bundle-branch block, unspecified: Secondary | ICD-10-CM | POA: Diagnosis not present

## 2015-02-25 DIAGNOSIS — I428 Other cardiomyopathies: Secondary | ICD-10-CM

## 2015-02-25 DIAGNOSIS — I429 Cardiomyopathy, unspecified: Secondary | ICD-10-CM

## 2015-02-25 LAB — MDC_IDC_ENUM_SESS_TYPE_INCLINIC
Battery Voltage: 3.12 V
Brady Statistic AP VS Percent: 0.03 %
Brady Statistic AS VP Percent: 98.3 %
Brady Statistic AS VS Percent: 1.35 %
Brady Statistic RV Percent Paced: 1.28 %
Date Time Interrogation Session: 20160420133119
HIGH POWER IMPEDANCE MEASURED VALUE: 228 Ohm
HighPow Impedance: 50 Ohm
Lead Channel Impedance Value: 418 Ohm
Lead Channel Impedance Value: 456 Ohm
Lead Channel Pacing Threshold Amplitude: 0.75 V
Lead Channel Pacing Threshold Amplitude: 1 V
Lead Channel Pacing Threshold Pulse Width: 0.4 ms
Lead Channel Pacing Threshold Pulse Width: 0.8 ms
Lead Channel Sensing Intrinsic Amplitude: 0.375 mV
Lead Channel Sensing Intrinsic Amplitude: 0.875 mV
Lead Channel Setting Pacing Amplitude: 2 V
Lead Channel Setting Pacing Amplitude: 3.5 V
Lead Channel Setting Pacing Pulse Width: 0.4 ms
Lead Channel Setting Pacing Pulse Width: 0.8 ms
MDC IDC MSMT BATTERY REMAINING LONGEVITY: 97 mo
MDC IDC MSMT LEADCHNL RA PACING THRESHOLD AMPLITUDE: 0.625 V
MDC IDC MSMT LEADCHNL RA PACING THRESHOLD PULSEWIDTH: 0.4 ms
MDC IDC MSMT LEADCHNL RV SENSING INTR AMPL: 12.875 mV
MDC IDC MSMT LEADCHNL RV SENSING INTR AMPL: 13.125 mV
MDC IDC SET LEADCHNL RV PACING AMPLITUDE: 3.5 V
MDC IDC SET LEADCHNL RV SENSING SENSITIVITY: 0.3 mV
MDC IDC SET ZONE DETECTION INTERVAL: 300 ms
MDC IDC SET ZONE DETECTION INTERVAL: 340 ms
MDC IDC SET ZONE DETECTION INTERVAL: 350 ms
MDC IDC STAT BRADY AP VP PERCENT: 0.32 %
MDC IDC STAT BRADY RA PERCENT PACED: 0.35 %
Zone Setting Detection Interval: 360 ms

## 2015-02-25 NOTE — Progress Notes (Signed)
Wound check appointment. Steri-strips removed. Wound without redness or edema. Incision edges approximated, wound well healed. Normal device function. Thresholds, sensing, and impedances consistent with implant measurements. Device programmed at 3.5V for extra safety margin until 3 month visit. Histogram distribution appropriate for patient and level of activity. No mode switches or ventricular arrhythmias noted. Changed VT NID from 16 to 32. Patient educated about wound care, arm mobility, lifting restrictions, shock plan. ROV w/ SK 05/26/15.

## 2015-02-28 NOTE — H&P (Signed)
PATIENT NAME:  Bethany [name], Bethany [name] MR#:  [id number] DATE OF BIRTH:  1945-12-12  DATE OF ADMISSION:  02/20/2014  REFERRING PHYSICIAN:  Dr. Bayard Males.   PRIMARY CARE PHYSICIAN:  Dr. [name].   PRIMARY CARDIOLOGIST:  Dr. Gwen Pounds.   CHIEF COMPLAINT:  Chest pain.   HISTORY OF PRESENT ILLNESS:  This is a 68 year old female with significant past medical history who presents with complaints of chest pain, the patient was recently seen by cardiology for complaints of shortness of breath, and where she is being planned to have a stress test as an outpatient, the patient reports her symptoms have been going on and off for the last month, but has worsened yesterday evening, described it as midsternal, throbbing, nonradiating.  It happened at rest this time.  Usually reports in the past was having it upon exercise, relieved by mild rest, as well reports shortness of breath, mainly orthopnea, currently using three pillows where she used to use one pillow over the last month, now she is on three pillows, as well reports exertional shortness of breath, the patient had initial troponin 0.05.  Repeat troponin was 0.08, her EKG showing intraventricular block, the patient had CT chest angiogram to rule out PE which came back negative for PE and it did show evidence of severe cardiomegaly with diffuse intralobular septal thickening, was compatible with pulmonary edema and bilateral pleural effusion.  Hospitalist service were requested to the patient for further evaluation.   PAST MEDICAL HISTORY: 1.  Diabetes mellitus.  2.  Hypothyroidism.  3.  Gastroesophageal reflux disease.   FAMILY HISTORY:  Significant for CVA, diabetes, coronary artery disease in her sister.   SOCIAL HISTORY:  The patient does not smoke, does not drink alcohol.  No illicit drug use.   ALLERGIES:  SULFA DRUGS.   HOME MEDICATIONS: 1.  Claritin 10 mg oral daily.  2.  Synthroid 50 mcg oral daily.  3.  Januvia 100 mg oral daily.    5.  Prilosec daily.   REVIEW OF SYSTEMS:  CONSTITUTIONAL:  The patient denies fever, chills.  Complains of fatigue, weakness.  Denies any weight gain or weight loss.  EYES:  Denies any blurry vision, double vision, inflammation, glaucoma.  EARS, NOSE, THROAT:  Denies tinnitus, ear pain, hearing loss, epistaxis or discharge.  RESPIRATORY:  Denies any cough, wheezing, hemoptysis.  Reports shortness of breath and orthopnea.  Denies any hemoptysis.  CARDIOVASCULAR:  Reports of chest pain.  Mild edema.  Denies any syncope or palpitations. GASTROINTESTINAL:  Denies nausea, vomiting, abdominal pain, diarrhea, constipation, hematemesis, melena.  GENITOURINARY:  Denies dysuria, hematuria, renal colic.  ENDOCRINE:  Denies polyuria, polydipsia, heat or cold intolerance.  HEMATOLOGY:  Denies anemia, easy bruising, bleeding diathesis.  INTEGUMENTAR:  Denies acne, rash or skin lesion.  MUSCULOSKELETAL:  Denies any neck pain, back pain, swelling, gout, cramps.  NEUROLOGIC:  Denies CVA, TIA, vertigo, tremors, ataxia.  PSYCHIATRIC:  Denies anxiety, insomnia or depression.   PHYSICAL EXAMINATION: VITAL SIGNS:   pulse 103, respiratory rate 18, blood pressure 113/84, saturating 94% on room air. GENERAL:  Well-nourished female who looks comfortable in bed in no apparent distress.  HEENT:  Head atraumatic, normocephalic.  Pupils equal, reactive to light.  Pink conjunctivae.  Anicteric sclerae.  Moist oral mucosa.  NECK:  Supple.  No thyromegaly.  No JVD.  CHEST:  Good air entry bilaterally.  No wheezing, rales, rhonchi.  Has bibasilar crackles.  CARDIOVASCULAR:  S1, S2 heard.  No rubs, murmurs, gallops.  Tachycardic,  but regular.  ABDOMEN:  Soft, nontender, nondistended.  Bowel sounds present.  EXTREMITIES:  Mild pitting edema.  No clubbing.  No cyanosis.  Pedal and radial pulses +2 bilaterally.  PSYCHIATRIC:  Appropriate affect.  Awake, alert x 3.  Intact judgment and insight.  NEUROLOGIC:  Cranial nerves  grossly intact.  Motor five out of five.  No focal deficit.  MUSCULOSKELETAL:  No joint effusion or erythema.  SKIN:  Warm and dry.  Normal skin turgor.   PERTINENT LABORATORY DATA:  Glucose 140, BNP 9422, BUN 18, creatinine 0.99, sodium 142, potassium 4.2, chloride 110.  Troponin, first one 0.05, repeat 0.08.  White blood cells 4, hemoglobin 13.1, hematocrit 40.4, platelets 114.  CT chest showing no evidence of pulmonary embolism and showing severe cardiomegaly and diffuse septal thickening.  EKG showing normal sinus rhythm with interventricular block.   ASSESSMENT AND PLAN: 1.  Acute coronary syndrome/non-ST-elevation myocardial infarction, the patient presents with complaints of chest pain and shortness of breath, appears to be in acute coronary syndrome with non-ST-elevation myocardial infarction as her troponin came back elevated, she will be admitted to telemetry unit.  We will start her on Lovenox 1 mg/kg.  We will give her 324 mg of aspirin.  We will start her on beta blocker as she is tachycardic and we will check her lipid panel.  We will consult cardiology who is familiar with the patient Dr. Gwen Pounds.  As well, we will put her on sublingual nitro as needed for chest pain.  2.  Congestive heart failure, appears to be new onset, most likely related to her acute coronary syndrome, we will check 2-D echocardiogram.  We will diuresis with intravenous Lasix.  3.  Hypothyroidism.  Continue with Synthroid.  4.  Diabetes mellitus.  Hold oral agents.  Have her on insulin sliding scale.  5.  Deep vein thrombosis prophylaxis.  The patient has full dose anticoagulation with Lovenox.  6.  CODE STATUS:  DISCUSSED WITH THE PATIENT, SHE IS A FULL CODE.  HER SISTER IS HER HEALTHCARE POWER OF ATTORNEY AND SHE IS A FULL CODE.   Total time spent on admission and patient care 50 minutes.    ____________________________ Starleen Arms, MD dse:ea D: 02/21/2014 02:11:06 ET T: 02/21/2014 03:03:20  ET JOB#: [id number]  cc: Starleen Arms, MD, <Dictator> Iyla Balzarini Teena Irani MD ELECTRONICALLY SIGNED 02/21/2014 23:44

## 2015-02-28 NOTE — Consult Note (Signed)
Brief Consult Note: Diagnosis: epigastric and periumbilical abdominal pain and weighht loss.   Patient was seen by consultant.   Recommend further assessment or treatment.   Comments: Please see full gi consult [phone].   Patient presenting with shortness of breath and chest pain.  Other sx include weight loss and epigastric/periumbilical pain.  Awaitning further cardiology evaluation.  Recommend CT with contrast (cta) to evaluate the mesenteric vessels for mesenteric stenosis, possible exacerbated  symptoms by poor cardiac fnx.  EGD as clinically feasible, depending on cardiology results, may need to do UGIS rather than sedated proceedure.  Following.  Electronic Signatures: Barnetta Chapel (MD)  (Signed 19-Apr-15 18:10)  Authored: Brief Consult Note   Last Updated: 19-Apr-15 18:10 by Barnetta Chapel (MD)

## 2015-02-28 NOTE — Discharge Summary (Signed)
Dates of Admission and Diagnosis:  Date of Admission 21-Feb-2014   Date of Discharge 27-Feb-2014   Admitting Diagnosis Ac CHF, Acute NSTEMI   Final Diagnosis Ac systolic Heart failure Epigastric pain- Upper GI and SBS with barium done- negative- need follow up in GI clinic in 3-4 weeks. Hyperglycemia- Diet controlled Hypothyroidism Elevated troponin- Likely demand ischemia- cardiac cath done.    Chief Complaint/History of Present Illness a 69 year old female with significant past medical history who presents with complaints of chest pain, the patient was recently seen by cardiology for complaints of shortness of breath, and where she is being planned to have a stress test as an outpatient, the patient reports her symptoms have been going on and off for the last month, but has worsened yesterday evening, described it as midsternal, throbbing, nonradiating.  It happened at rest this time.  Usually reports in the past was having it upon exercise, relieved by mild rest, as well reports shortness of breath, mainly orthopnea, currently using three pillows where she used to use one pillow over the last month, now she is on three pillows, as well reports exertional shortness of breath, the patient had initial troponin 0.05.  Repeat troponin was 0.08, her EKG showing intraventricular block, the patient had CT chest angiogram to rule out PE which came back negative for PE and it did show evidence of severe cardiomegaly with diffuse intralobular septal thickening, was compatible with pulmonary edema and bilateral pleural effusion.  Hospitalist service were requested to the patient for further evaluation.   Allergies:  Sulfa drugs: Unknown  Thyroid:  16-Apr-15 19:57   Thyroid Stimulating Hormone 3.71 (0.45-4.50 (International Unit)  ----------------------- Pregnant patients have  different reference  ranges for TSH:  - - - - - - - - - -  Pregnant, first trimetser:  0.36 - 2.50 uIU/mL)  Cardiology:   17-Apr-15 13:17   Echo Doppler REASON FOR EXAM:     COMMENTS:     PROCEDURE: [place] - ECHO DOPPLER COMPLETE(TRANSTHOR)  - Feb 21 2014  1:17PM   RESULT: Echocardiogram Report  Patient Name:   Bethany [name] Date of Exam: 02/21/2014 Medical Rec #:  [id number]          Custom1: Date of Birth:  May 29, 1946        Height:       61.0 in Patient Age:    26 years        Weight:       123.0 lb Patient Gender: F               BSA:          1.54 m??  Indications: CHF Sonographer:    Sherrie Sport RDCS Referring Phys: Phillips Climes, S  Summary:  1. Left ventricular ejection fraction, by visual estimation, is <20%.  2. Severely decreased global left ventricular systolic function.  3. Moderately increased left ventricular internal cavity size.  4. Decreased left ventricular septal thickness.  5. Moderately enlarged right ventricle.  6. Moderately reduced RV systolic function.  7. Mildly dilated left atrium.  8. Moderate pleural effusion in the left lateral region.  9. Small pericardial effusion, as described above. 10. Moderate mitral valve regurgitation. 11. Mild aortic regurgitation. 12. Mild aortic valve sclerosis without stenosis. 13. Mild to moderate pulmonic valve regurgitation. 14. Moderate tricuspid regurgitation. 15. Mildly increased left ventricular posterior wall thickness. 2D AND M-MODE MEASUREMENTS (normal ranges within parentheses): Left Ventricle:          Normal  IVSd (2D):      0.69 cm (0.7-1.1) LVPWd (2D):     1.11 cm (0.7-1.1) Aorta/LA:                  Normal LVIDd (2D):     6.01cm (3.4-5.7) Aortic Root (2D): 2.90 cm (2.4-3.7) LVIDs (2D):     5.87 cm           Left Atrium (2D): 3.50 cm (1.9-4.0) LV FS (2D):      2.3 %   (>25%) LV EF (2D):      5.2 %   (>50%)                                   Right Ventricle:                       RVd (2D):        0.62 cm LV SYSTOLIC FUNCTION BY 2D PLANIMETRY (MOD): EF-A4C View: 18.8 % EF-A2C View: 21.0 % EF-Biplane: 37.6 % LV DIASTOLIC  FUNCTION: MV Peak E: 0.44 m/s E/e' Ratio: 7.80 MV Peak A: 0.86 m/s Decel Time: 5mec E/A Ratio: 0.51 SPECTRAL DOPPLER ANALYSIS (where applicable): Mitral Valve: MV P1/2 Time: 28.71 msec MV Area, PHT: 7.66 cm?? Aortic Valve: AoV Max Vel: 0.64 m/s AoV Peak PG: 1.6 mmHg AoV Mean PG: LVOT Vmax: 0.47 m/s LVOT VTI:  LVOT Diameter: 2.00 cm AoV Area, Vmax: 2.31 cm?? AoV Area, VTI:  AoV Area, Vmn: Tricuspid Valve and PA/RV Systolic Pressure: TR Max Velocity: 2.38 m/s RA  Pressure: 5 mmHg RVSP/PASP: 27.6 mmHg Pulmonic Valve: PV Max Velocity: 0.52 m/s PV Max PG: 1.1 mmHg PV Mean PG: Pulmonic Insufficiency: PI EDV: 1.05 m/s PADP: 9.4 mmHg  PHYSICIAN INTERPRETATION: Left Ventricle: The left ventricular internal cavity size was moderately  increased. LV septal wall thickness was decreased. LV posterior wall  thicknesswas mildly increased. Global LV systolic function was severely  decreased. Left ventricular ejection fraction, by visual estimation, is  <20%. Right Ventricle: The right ventricular size is moderately enlarged.  Global RV systolic function is moderately reduced. Left Atrium: The left atrium is mildly dilated. Right Atrium: The right atrium is normal in size. Pericardium: A small pericardial effusion is present. There is a moderate  pleural effusion in the left lateral region. Mitral Valve: The mitral valve is normal in structure. Moderate mitral  valve regurgitation is seen. Tricuspid Valve: The tricuspid valve is normal. Moderate tricuspid  regurgitation is visualized. The tricuspid regurgitant velocity is 2.38  m/s, and with anassumed right atrial pressure of 5 mmHg, the estimated  right ventricular systolic pressure is normal at 27.6 mmHg. Aortic Valve: The aortic valve is normal. Mild aortic valve sclerosis is  present, with no evidence of aortic valve stenosis. Mild aortic valve  regurgitation is seen. Pulmonic Valve: The pulmonic valve is normal. Mild to moderate pulmonic   valve regurgitation.  1HeberMD Electronically signed by 1CalhounMD Signature Date/Time: 02/21/2014/3:11:18 PM *** Final ***  IMPRESSION: .    Verified By: DYolonda Kida M.D., MD  Routine Chem:  16-Apr-15 19:57   Glucose, Serum  140  BUN 18  Creatinine (comp) 0.99  Sodium, Serum 142  Potassium, Serum 4.2  Chloride, Serum  110  CO2, Serum 24  Calcium (Total), Serum 9.1  Anion Gap 8  Osmolality (calc) 287  eGFR (African American) >60  eGFR (Non-African American)  59 (eGFR values <52m/min/1.73 m2 may be an indication of chronic kidney disease (CKD). Calculated eGFR is useful in patients with stable renal function. The eGFR calculation will not be reliable in acutely ill patients when serum creatinine is changing rapidly. It is not useful in  patients on dialysis. The eGFR calculation may not be applicable to patients at the low and high extremes of body sizes, pregnant women, and vegetarians.)  Result Comment platelets - VERIFIED BY SMEAR ESTIMATE  - by jem  Result(s) reported on 20 Feb 2014 at 09:27PM.  Hemoglobin A1c ([facility]) 6.3 (The American Diabetes Association recommends that a primary goal of therapy should be <7% and that physicians should reevaluate the treatment regimen in patients with HbA1c values consistently >8%.)  23-Apr-15 05:44   Glucose, Serum  119  BUN 12  Creatinine (comp) 0.80  Sodium, Serum 140  Potassium, Serum 4.3  Chloride, Serum 107  CO2, Serum 28  Calcium (Total), Serum 8.9  Anion Gap  5  Osmolality (calc) 280  eGFR (African American) >60  eGFR (Non-African American) >60 (eGFR values <638mmin/1.73 m2 may be an indication of chronic kidney disease (CKD). Calculated eGFR is useful in patients with stable renal function. The eGFR calculation will not be reliable in acutely ill patients when serum creatinine is changing rapidly. It is not useful in  patients on dialysis. The eGFR calculation may not be  applicable to patients at the low and high extremes of body sizes, pregnant women, and vegetarians.)  Cardiac:  16-Apr-15 19:57   CK, Total 111 (26-192 NOTE: NEW REFERENCE RANGE  12/09/2013)  CPK-MB, Serum 2.7 (Result(s) reported on 22 Feb 2014 at 05:52AM.)  Troponin I 0.05 (0.00-0.05 0.05 ng/mL or less: NEGATIVE  Repeat testing in 3-6 hrs  if clinically indicated. >0.05 ng/mL: POTENTIAL  MYOCARDIAL INJURY. Repeat  testing in 3-6 hrs if  clinically indicated. NOTE: An increase or decrease  of 30% or more on serial  testing suggests a  clinically important change)  17-Apr-15 00:14   Troponin I  0.08 (0.00-0.05 0.05 ng/mL or less: NEGATIVE  Repeat testing in 3-6 hrs  if clinically indicated. >0.05 ng/mL: POTENTIAL  MYOCARDIAL INJURY. Repeat  testing in 3-6 hrs if  clinically indicated. NOTE: An increase or decrease  of 30% or more on serial  testing suggests a  clinically important change)    04:17   Troponin I  0.09 (0.00-0.05 0.05 ng/mL or less: NEGATIVE  Repeat testing in 3-6 hrs  if clinically indicated. >0.05 ng/mL: POTENTIAL  MYOCARDIAL INJURY. Repeat  testing in 3-6 hrs if  clinically indicated. NOTE: An increase or decrease  of 30% or more on serial  testing suggests a  clinically important change)  Routine Hem:  16-Apr-15 19:57   WBC (CBC) 4.0  RBC (CBC) 4.34  Hemoglobin (CBC) 13.1  Hematocrit (CBC) 40.4  Platelet Count (CBC)  114  MCV 93  MCH 30.1  MCHC 32.4  RDW 14.3   PERTINENT RADIOLOGY STUDIES: XRay:    16-Apr-15 20:24, Chest PA and Lateral  Chest PA and Lateral   REASON FOR EXAM:    SOB  COMMENTS:       PROCEDURE: DXR - DXR CHEST PA (OR AP) AND LATERAL  - Feb 20 2014  8:24PM     CLINICAL DATA:  cough, sob, weakness    EXAM:  CHEST  2 VIEW    COMPARISON:  None.    FINDINGS:  The cardiac silhouette is enlarged.Small bilateral effusions  appreciated. Areas of  increased density in the lung bases. The  osseous structures  unremarkable.     IMPRESSION:  Atelectasis versus infiltrate within the lung bases    Small bilateral effusions    Cardiomegaly      Electronically Signed    By: Margaree Mackintosh M.D.    On: 02/20/2014 20:58       Verified By: Mikki Santee, M.D., MD    23-Apr-15 16:15, Upper GI With Small Bowel  Upper GI With Small Bowel   REASON FOR EXAM:    nausea epigastric pain, weight loss  COMMENTS:       PROCEDURE: [place]  - FL UPPER GI WITH SMALL BOWEL  - Feb 27 2014  4:15PM     CLINICAL DATA:  Epigastric pain, weight loss    EXAM:  UPPER GI SERIES WITH SMALL BOWEL FOLLOW-THROUGH    FLUOROSCOPY TIME:  1 min 48 seconds    TECHNIQUE:  Combined double contrast and single contrast upper GI series using  effervescent crystals, thick barium, and thin barium. Subsequently,  serial images of the small bowel were obtained including spotviews  of the terminal ileum.    COMPARISON:  None.    FINDINGS:  Examination of the esophagus demonstrated normal esophageal  motility. Normal esophageal morphology without evidence of  esophagitis or ulceration. No esophageal stricture, diverticula, or  mass lesion. No evidence of hiatal hernia. There is no spontaneous  or inducible gastroesophageal reflux.    Examination of the stomach demonstrated normal rugal folds and areae  gastricae. The gastric mucosa appeared unremarkable without evidence  of ulceration, scarring, or mass lesion. Gastric motility and  emptying was normal. Fluoroscopic examination of the duodenum  demonstrates normal motility and morphology without evidence of  ulceration or mass lesion.    Medium density barium was periodically observed under fluoroscopy to  travel from the stomach to the ascending colon (over a 60 minute  time period). There is no evidence of small bowel stricture or  obstruction. No large filling defects to suggest mass lesion. In  addition, there is no evidence of tethering or definite  inflammatory  changes present within the small bowel.     IMPRESSION:  Normal upper GI and small-bowel follow-through.    Electronically Signed    By: Kathreen Devoid    On: 02/27/2014 16:53         Verified By: Jennette Banker, M.D., MD  LabUnknown:    17-Apr-15 00:57, CT [facility] Chest with for PE  PACS Image    Pertinent Past History:  Pertinent Past History 1.  Diabetes mellitus.  2.  Hypothyroidism.  3.  Gastroesophageal reflux disease.   Hospital Course:  Hospital Course 1.  elevated troponin,  due to demand ishcemia- supportive care- s/p cath- no ACS - appreciated cardiology help. continued cardiac meds. 2. Acute systolic CHF,  bp very low,initially started but later stoped lasix,- gave some IV fluid- so improved BP stable- and all meds were held. 3. cardiomyoapthy, non ishcemic-  on dig - no spironolactone due to Low BP. 4.DM, was on januvia, HBA1c- 6.3, BS <150-160 without any meds. Advise continue diet control. 5. hypothyroidism, continue syntroid tsh normal. 6 thrombocytopenia, stable- no active bleed.   7 upper abdominal pain, weight loss,  seen by gi recommends ct aniogram- which was negative for ischmia- so suggested Upper GI series and Small bowel series- which was normal- so discharged with follow up in [place] with PPI oral.   Condition on Discharge Stable  Code Status:  Code Status Full Code   DISCHARGE INSTRUCTIONS HOME MEDS:  Medication Reconciliation: Patient's Home Medications at Discharge:     Medication Instructions  synthroid 50 mcg (0.05 mg) oral tablet  1 tab(s) orally once a day   melatonin 3 mg oral tablet  1 tab(s) orally once (at bedtime)   digoxin 125 mcg (0.125 mg) oral tablet  1 tab(s) orally once a day   aspirin 81 mg oral tablet, chewable  1 tab(s) orally once a day   senna  1 tab(s) orally 2 times a day, As Needed, constipation , As needed, constipation   pantoprazole 40 mg oral delayed release tablet  1 tab(s) orally once a day    glucerna shake *  237 milliliter(s) orally 2 times a day    STOP TAKING THE FOLLOWING MEDICATION(S):    januvia 100 mg oral tablet: 1 tab(s) orally once a day prilosec:  orally  melatonin:  orally  claritin 10 mg oral tablet: 1 tab(s) orally once a day, As Needed omeprazole 20 mg oral delayed release capsule: 1 cap(s) orally once a day  Physician's Instructions:  Diet Low Sodium  Carbohydrate Controlled (ADA) Diet   Dietary Supplements Glucerna   Dietary Supplements Frequency Two times per day   Activity Limitations As tolerated   Return to Work Not Applicable   Time frame for Follow Up Appointment 1-2 weeks  Cardiology clinic   Time frame for Follow Up Appointment 2-4 weeks  GI clinic.   Electronic Signatures: Vaughan Basta (MD)  (Signed 25-Apr-15 19:08)  Authored: ADMISSION DATE AND DIAGNOSIS, CHIEF COMPLAINT/HPI, Allergies, PERTINENT LABS, PERTINENT RADIOLOGY STUDIES, PERTINENT PAST HISTORY, HOSPITAL COURSE, DISCHARGE INSTRUCTIONS HOME MEDS, PATIENT INSTRUCTIONS   Last Updated: 25-Apr-15 19:08 by Vaughan Basta (MD)

## 2015-02-28 NOTE — Consult Note (Signed)
Chief Complaint:  Subjective/Chief Complaint Pt state sthat her sob is better . She still has some cp and sob. Much improved on 02.   VITAL SIGNS/ANCILLARY NOTES: **Vital Signs.:   18-Apr-15 08:05  Vital Signs Type Post Medication  Temperature Temperature (F) 98.1  Celsius 36.7  Pulse Pulse 81  Respirations Respirations 20  Systolic BP Systolic BP 95  Diastolic BP (mmHg) Diastolic BP (mmHg) 62  Mean BP 73  Pulse Ox % Pulse Ox % 96  Pulse Ox Activity Level  At rest  Oxygen Delivery Room Air/ 21 %  *Intake and Output.:   18-Apr-15 08:53  Grand Totals Intake:  240 Output:      Net:  240 82 Hr.:  240  Oral Intake      In:  240  Percentage of Meal Eaten  100   Brief Assessment:  GEN well developed, well nourished, no acute distress, thin   Cardiac Regular  murmur present  + JVD  --Gallop   Respiratory normal resp effort  no use of accessory muscles  rhonchi   Gastrointestinal Normal   Gastrointestinal details normal Soft   EXTR negative cyanosis/clubbing, negative edema   Lab Results: Routine Chem:  18-Apr-15 04:31   Creatinine (comp) 0.95  eGFR (African American) >60  eGFR (Non-African American) >60 (eGFR values <54m/min/1.73 m2 may be an indication of chronic kidney disease (CKD). Calculated eGFR is useful in patients with stable renal function. The eGFR calculation will not be reliable in acutely ill patients when serum creatinine is changing rapidly. It is not useful in  patients on dialysis. The eGFR calculation may not be applicable to patients at the low and high extremes of body sizes, pregnant women, and vegetarians.)  Glucose, Serum 93  BUN  21  Sodium, Serum 141  Potassium, Serum 3.9  Chloride, Serum 106  CO2, Serum 26  Calcium (Total), Serum 8.9  Anion Gap 9  Osmolality (calc) 284  Magnesium, Serum  1.6 (1.8-2.4 THERAPEUTIC RANGE: 4-7 mg/dL TOXIC: > 10 mg/dL  -----------------------)  Routine Hem:  18-Apr-15 04:31   Platelet Count (CBC)   106 (Result(s) reported on 22 Feb 2014 at 05:45AM.)  WBC (CBC) 3.6  RBC (CBC) 4.32  Hemoglobin (CBC) 12.8  Hematocrit (CBC) 40.2  MCV 93  MCH 29.6  MCHC  31.8  RDW 14.5  Segmented Neutrophils 20  Lymphocytes 68  Variant Lymphocytes 4  Monocytes 7  Eosinophil 1  Diff Comment 1 ANISOCYTOSIS  Diff Comment 2 LARGE PLATELETS  Diff Comment 3 PLTS VARIED IN SIZE  Result(s) reported on 22 Feb 2014 at 05:45AM.   Radiology Results: XRay:    16-Apr-15 20:24, Chest PA and Lateral  Chest PA and Lateral   REASON FOR EXAM:    SOB  COMMENTS:       PROCEDURE: DXR - DXR CHEST PA (OR AP) AND LATERAL  - Feb 20 2014  8:24PM     CLINICAL DATA:  cough, sob, weakness    EXAM:  CHEST  2 VIEW    COMPARISON:  None.    FINDINGS:  The cardiac silhouette is enlarged.Small bilateral effusions  appreciated. Areas of increased density in the lung bases. The  osseous structures unremarkable.     IMPRESSION:  Atelectasis versus infiltrate within the lung bases    Small bilateral effusions    Cardiomegaly      Electronically Signed    By: HMargaree MackintoshM.D.    On: 02/20/2014 20:58       Verified  By: Mikki Santee, M.D., MD  Cardiology:    16-Apr-15 20:01, ED ECG  Ventricular Rate 114  Atrial Rate 114  P-R Interval 122  QRS Duration 130  QT 370  QTc 509  P Axis 68  R Axis -118  T Axis 60  ECG interpretation   Sinus tachycardia with occasional Premature ventricular complexes  Possible Left atrial enlargement  Right superior axis deviation  Non-specific intra-ventricular conduction block  Abnormal ECG  When compared with ECG of 02-Apr-2013 10:24,  Prematureventricular complexes are now Present  Questionable change in QRS axis  ----------unconfirmed----------  Confirmed by OVERREAD, NOT ([id number]), editor PEARSON, BARBARA ([id number]) on 02/21/2014 2:33:29 PM  ED ECG     17-Apr-15 13:17, Echo Doppler  Echo Doppler   REASON FOR EXAM:      COMMENTS:       PROCEDURE: [place] - ECHO DOPPLER  COMPLETE(TRANSTHOR)  - Feb 21 2014  1:17PM     RESULT: Echocardiogram Report    Patient Name:   Bethany [name] Date of Exam: 02/21/2014  Medical Rec #:  [id number]          Custom1:  Date of Birth:  1946-06-21        Height:       61.0 in  Patient Age:    69 years        Weight:       123.0 lb  Patient Gender: F               BSA:          1.54 m??    Indications: CHF  Sonographer:    Sherrie Sport RDCS  Referring Phys: Phillips Climes, S    Summary:   1. Left ventricular ejection fraction, by visual estimation, is <20%.   2. Severely decreased global left ventricular systolic function.   3. Moderately increased left ventricular internal cavity size.   4. Decreased left ventricular septal thickness.   5. Moderately enlarged right ventricle.   6. Moderately reduced RV systolic function.   7. Mildly dilated left atrium.   8. Moderate pleural effusion in the left lateral region.   9. Small pericardial effusion, as described above.  10. Moderate mitral valve regurgitation.  11. Mild aortic regurgitation.  12. Mild aortic valve sclerosis without stenosis.  13. Mild to moderate pulmonic valve regurgitation.  14. Moderate tricuspid regurgitation.  15. Mildly increased left ventricular posterior wall thickness.  2D AND M-MODE MEASUREMENTS (normal ranges within parentheses):  Left Ventricle:          Normal  IVSd (2D):      0.69 cm (0.7-1.1)  LVPWd (2D):     1.11 cm (0.7-1.1) Aorta/LA:                  Normal  LVIDd (2D):     6.01cm (3.4-5.7) Aortic Root (2D): 2.90 cm (2.4-3.7)  LVIDs (2D):     5.87 cm           Left Atrium (2D): 3.50 cm (1.9-4.0)  LV FS (2D):      2.3 %   (>25%)  LV EF (2D):      5.2 %   (>50%)                                    Right Ventricle:  RVd (2D):        4.09 cm  LV SYSTOLIC FUNCTION BY 2D PLANIMETRY (MOD):  EF-A4C View: 18.8 % EF-A2C View: 21.0 % EF-Biplane: 81.1 %  LV DIASTOLIC FUNCTION:  MV Peak E: 0.44 m/s E/e' Ratio: 7.80  MV Peak A:  0.86 m/s Decel Time: 19mec  E/A Ratio: 0.51  SPECTRAL DOPPLER ANALYSIS (where applicable):  Mitral Valve:  MV P1/2 Time: 28.71 msec  MV Area, PHT: 7.66 cm??  Aortic Valve: AoV Max Vel: 0.64 m/s AoV Peak PG: 1.6 mmHg AoV Mean PG:  LVOT Vmax: 0.47 m/s LVOT VTI:  LVOT Diameter: 2.00 cm  AoV Area, Vmax: 2.31 cm?? AoV Area, VTI:  AoV Area, Vmn:  Tricuspid Valve and PA/RV Systolic Pressure: TR Max Velocity: 2.38 m/s RA   Pressure: 5 mmHg RVSP/PASP: 27.6 mmHg  Pulmonic Valve:  PV Max Velocity: 0.52 m/s PV Max PG: 1.1 mmHg PV Mean PG:  Pulmonic Insufficiency:  PI EDV: 1.05 m/s PADP: 9.4 mmHg    PHYSICIAN INTERPRETATION:  Left Ventricle: The left ventricular internal cavity size was moderately   increased. LV septal wall thickness was decreased. LV posterior wall   thicknesswas mildly increased. Global LV systolic function was severely   decreased. Left ventricular ejection fraction, by visual estimation, is   <20%.  Right Ventricle: The right ventricular size is moderately enlarged.   Global RV systolic function is moderately reduced.  Left Atrium: The left atrium is mildly dilated.  Right Atrium: The right atrium is normal in size.  Pericardium: A small pericardial effusion is present. There is a moderate   pleural effusion in the left lateral region.  Mitral Valve: The mitral valve is normal in structure. Moderate mitral   valve regurgitation is seen.  Tricuspid Valve: The tricuspid valve is normal. Moderate tricuspid   regurgitation is visualized. The tricuspid regurgitant velocity is 2.38   m/s, and with anassumed right atrial pressure of 5 mmHg, the estimated   right ventricular systolic pressure is normal at 27.6 mmHg.  Aortic Valve: The aortic valve is normal. Mild aortic valve sclerosis is   present, with no evidence of aortic valve stenosis. Mild aortic valve   regurgitation is seen.  Pulmonic Valve: The pulmonic valve is normal. Mild to moderate pulmonic   valve  regurgitation.    1Swan QuarterMD  Electronically signed by 1DelphosDLujean AmelMD  Signature Date/Time: 02/21/2014/3:11:18 PM  *** Final ***    IMPRESSION: .        Verified By: DYolonda Kida M.D., MD   Assessment/Plan:  Invasive Device Daily Assessment of Necessity:  Does the patient currently have any of the following indwelling devices? none   Assessment/Plan:  Assessment IMP CHF CM Murmur SOB HTN Thyroid disease Weakness CP .   Plan PLAN Tele ASA 81 mg daily Lovenox bid for anticoug Lasix iv for diuresis 02 2L [place] Low dose ACE Low dose B-blockers Agree with ECHO for CHF Consider cardiac cath R/L heart as inpt   Electronic Signatures: CLujean AmelD (MD)  (Signed 20-Apr-15 13:25)  Authored: Chief Complaint, VITAL SIGNS/ANCILLARY NOTES, Brief Assessment, Lab Results, Radiology Results, Assessment/Plan   Last Updated: 20-Apr-15 13:25 by CYolonda Kida(MD)

## 2015-02-28 NOTE — Consult Note (Signed)
Chief Complaint:  Subjective/Chief Complaint seen for epigastric abdominal pain nausea and weight loss.  cath results noted.  denies abdominal pain, had some nausea earlier today   VITAL SIGNS/ANCILLARY NOTES: **Vital Signs.:   20-Apr-15 11:30  Vital Signs Type Post-Procedure  Temperature Temperature (F) 97.6  Celsius 36.4  Temperature Source oral  Pulse Pulse 100  Respirations Respirations 20  Systolic BP Systolic BP 91  Diastolic BP (mmHg) Diastolic BP (mmHg) 62  Mean BP 71  Pulse Ox % Pulse Ox % 95  Pulse Ox Activity Level  At rest  Oxygen Delivery Room Air/ 21 %    17:17  Vital Signs Type Pre Medication  Pulse Pulse 86  Systolic BP Systolic BP 756  Diastolic BP (mmHg) Diastolic BP (mmHg) 68  Mean BP 78  Pulse Ox % Pulse Ox % 97  Pulse Ox Activity Level  At rest  Oxygen Delivery Room Air/ 21 %   Brief Assessment:  Cardiac Regular   Respiratory clear BS   Gastrointestinal details normal Soft  Nontender  Nondistended  No masses palpable  Bowel sounds normal   Lab Results: Cardiology:  17-Apr-15 13:17   Echo Doppler REASON FOR EXAM:     COMMENTS:     PROCEDURE: [facility] - ECHO DOPPLER COMPLETE(TRANSTHOR)  - Feb 21 2014  1:17PM   RESULT: Echocardiogram Report  Patient Name:   Bethany [name] Date of Exam: 02/21/2014 Medical Rec #:  [id number]          Custom1: Date of Birth:  23-Apr-1946        Height:       61.0 in Patient Age:    69 years        Weight:       123.0 lb Patient Gender: F               BSA:          1.54 m??  Indications: CHF Sonographer:    Sherrie Sport RDCS Referring Phys: Phillips Climes, S  Summary:  1. Left ventricular ejection fraction, by visual estimation, is <20%.  2. Severely decreased global left ventricular systolic function.  3. Moderately increased left ventricular internal cavity size.  4. Decreased left ventricular septal thickness.  5. Moderately enlarged right ventricle.  6. Moderately reduced RV systolic function.  7. Mildly dilated  left atrium.  8. Moderate pleural effusion in the left lateral region.  9. Small pericardial effusion, as described above. 10. Moderate mitral valve regurgitation. 11. Mild aortic regurgitation. 12. Mild aortic valve sclerosis without stenosis. 13. Mild to moderate pulmonic valve regurgitation. 14. Moderate tricuspid regurgitation. 15. Mildly increased left ventricular posterior wall thickness. 2D AND M-MODE MEASUREMENTS (normal ranges within parentheses): Left Ventricle:          Normal IVSd (2D):      0.69 cm (0.7-1.1) LVPWd (2D):     1.11 cm (0.7-1.1) Aorta/LA:                  Normal LVIDd (2D):     6.01cm (3.4-5.7) Aortic Root (2D): 2.90 cm (2.4-3.7) LVIDs (2D):     5.87 cm           Left Atrium (2D): 3.50 cm (1.9-4.0) LV FS (2D):      2.3 %   (>25%) LV EF (2D):      5.2 %   (>50%)  Right Ventricle:                       RVd (2D):        9.67 cm LV SYSTOLIC FUNCTION BY 2D PLANIMETRY (MOD): EF-A4C View: 18.8 % EF-A2C View: 21.0 % EF-Biplane: 89.3 % LV DIASTOLIC FUNCTION: MV Peak E: 0.44 m/s E/e' Ratio: 7.80 MV Peak A: 0.86 m/s Decel Time: 46mec E/A Ratio: 0.51 SPECTRAL DOPPLER ANALYSIS (where applicable): Mitral Valve: MV P1/2 Time: 28.71 msec MV Area, PHT: 7.66 cm?? Aortic Valve: AoV Max Vel: 0.64 m/s AoV Peak PG: 1.6 mmHg AoV Mean PG: LVOT Vmax: 0.47 m/s LVOT VTI:  LVOT Diameter: 2.00 cm AoV Area, Vmax: 2.31 cm?? AoV Area, VTI:  AoV Area, Vmn: Tricuspid Valve and PA/RV Systolic Pressure: TR Max Velocity: 2.38 m/s RA  Pressure: 5 mmHg RVSP/PASP: 27.6 mmHg Pulmonic Valve: PV Max Velocity: 0.52 m/s PV Max PG: 1.1 mmHg PV Mean PG: Pulmonic Insufficiency: PI EDV: 1.05 m/s PADP: 9.4 mmHg  PHYSICIAN INTERPRETATION: Left Ventricle: The left ventricular internal cavity size was moderately  increased. LV septal wall thickness was decreased. LV posterior wall  thicknesswas mildly increased. Global LV systolic function was severely  decreased.  Left ventricular ejection fraction, by visual estimation, is  <20%. Right Ventricle: The right ventricular size is moderately enlarged.  Global RV systolic function is moderately reduced. Left Atrium: The left atrium is mildly dilated. Right Atrium: The right atrium is normal in size. Pericardium: A small pericardial effusion is present. There is a moderate  pleural effusion in the left lateral region. Mitral Valve: The mitral valve is normal in structure. Moderate mitral  valve regurgitation is seen. Tricuspid Valve: The tricuspid valve is normal. Moderate tricuspid  regurgitation is visualized. The tricuspid regurgitant velocity is 2.38  m/s, and with anassumed right atrial pressure of 5 mmHg, the estimated  right ventricular systolic pressure is normal at 27.6 mmHg. Aortic Valve: The aortic valve is normal. Mild aortic valve sclerosis is  present, with no evidence of aortic valve stenosis. Mild aortic valve  regurgitation is seen. Pulmonic Valve: The pulmonic valve is normal. Mild to moderate pulmonic  valve regurgitation.  1[place] Electronically signed by 1PlummerDLujean AmelMD Signature Date/Time: 02/21/2014/3:11:18 PM *** Final ***  IMPRESSION: .    Verified By: DYolonda Kida M.D., MD  Routine Chem:  17-Apr-15 00:14   B-Type Natriuretic Peptide ([facility]  9422 (Result(s) reported on 21 Feb 2014 at 01:08AM.)  20-Apr-15 05:10   Creatinine (comp) 0.86  eGFR (African American) >60  eGFR (Non-African American) >60 (eGFR values <675mmin/1.73 m2 may be an indication of chronic kidney disease (CKD). Calculated eGFR is useful in patients with stable renal function. The eGFR calculation will not be reliable in acutely ill patients when serum creatinine is changing rapidly. It is not useful in  patients on dialysis. The eGFR calculation may not be applicable to patients at the low and high extremes of body sizes, pregnant women, and vegetarians.)  Routine  Hem:  20-Apr-15 05:10   Platelet Count (CBC)  112 (Result(s) reported on 24 Feb 2014 at 05:34AM.)   Radiology Results: Cardiology:    17-Apr-15 13:17, Echo Doppler  Echo Doppler   REASON FOR EXAM:      COMMENTS:       PROCEDURE: [place] ECHO DOPPLER [place]PLETE(TRANSTHOR)  - Feb 21 2014  1:17PM     RESULT: Echocardiogram Report    Patient Name:   Bethany [name]  Date of Exam: 02/21/2014  Medical Rec #:  [id number]          Custom1:  Date of Birth:  1946-06-08        Height:       61.0 in  Patient Age:    48 years        Weight:       123.0 lb  Patient Gender: F               BSA:          1.54 m??    Indications: CHF  Sonographer:    Sherrie Sport RDCS  Referring Phys: Phillips Climes, S    Summary:   1. Left ventricular ejection fraction, by visual estimation, is <20%.   2. Severely decreased global left ventricular systolic function.   3. Moderately increased left ventricular internal cavity size.   4. Decreased left ventricular septal thickness.   5. Moderately enlarged right ventricle.   6. Moderately reduced RV systolic function.   7. Mildly dilated left atrium.   8. Moderate pleural effusion in the left lateral region.   9. Small pericardial effusion, as described above.  10. Moderate mitral valve regurgitation.  11. Mild aortic regurgitation.  12. Mild aortic valve sclerosis without stenosis.  13. Mild to moderate pulmonic valve regurgitation.  14. Moderate tricuspid regurgitation.  15. Mildly increased left ventricular posterior wall thickness.  2D AND M-MODE MEASUREMENTS (normal ranges within parentheses):  Left Ventricle:          Normal  IVSd (2D):      0.69 cm (0.7-1.1)  LVPWd (2D):     1.11 cm (0.7-1.1) Aorta/LA:                  Normal  LVIDd (2D):     6.01cm (3.4-5.7) Aortic Root (2D): 2.90 cm (2.4-3.7)  LVIDs (2D):     5.87 cm           Left Atrium (2D): 3.50 cm (1.9-4.0)  LV FS (2D):      2.3 %   (>25%)  LV EF (2D):      5.2 %   (>50%)                                     Right Ventricle:                        RVd (2D):        2.70 cm  LV SYSTOLIC FUNCTION BY 2D PLANIMETRY (MOD):  EF-A4C View: 18.8 % EF-A2C View: 21.0 % EF-Biplane: 35.0 %  LV DIASTOLIC FUNCTION:  MV Peak E: 0.44 m/s E/e' Ratio: 7.80  MV Peak A: 0.86 m/s Decel Time: 53mec  E/A Ratio: 0.51  SPECTRAL DOPPLER ANALYSIS (where applicable):  Mitral Valve:  MV P1/2 Time: 28.71 msec  MV Area, PHT: 7.66 cm??  Aortic Valve: AoV Max Vel: 0.64 m/s AoV Peak PG: 1.6 mmHg AoV Mean PG:  LVOT Vmax: 0.47 m/s LVOT VTI:  LVOT Diameter: 2.00 cm  AoV Area, Vmax: 2.31 cm?? AoV Area, VTI:  AoV Area, Vmn:  Tricuspid Valve and PA/RV Systolic Pressure: TR Max Velocity: 2.38 m/s RA   Pressure: 5 mmHg RVSP/PASP: 27.6 mmHg  Pulmonic Valve:  PV Max Velocity: 0.52 m/s PV Max PG: 1.1 mmHg PV Mean PG:  Pulmonic Insufficiency:  PI EDV: 1.05 m/s PADP: 9.4  mmHg    PHYSICIAN INTERPRETATION:  Left Ventricle: The left ventricular internal cavity size was moderately   increased. LV septal wall thickness was decreased. LV posterior wall   thicknesswas mildly increased. Global LV systolic function was severely   decreased. Left ventricular ejection fraction, by visual estimation, is   <20%.  Right Ventricle: The right ventricular size is moderately enlarged.   Global RV systolic function is moderately reduced.  Left Atrium: The left atrium is mildly dilated.  Right Atrium: The right atrium is normal in size.  Pericardium: A small pericardial effusion is present. There is a moderate   pleural effusion in the left lateral region.  Mitral Valve: The mitral valve is normal in structure. Moderate mitral   valve regurgitation is seen.  Tricuspid Valve: The tricuspid valve is normal. Moderate tricuspid   regurgitation is visualized. The tricuspid regurgitant velocity is 2.38   m/s, and with anassumed right atrial pressure of 5 mmHg, the estimated   right ventricular systolic pressure is normal at 27.6 mmHg.  Aortic  Valve: The aortic valve is normal. Mild aortic valve sclerosis is   present, with no evidence of aortic valve stenosis. Mild aortic valve   regurgitation is seen.  Pulmonic Valve: The pulmonic valve is normal. Mild to moderate pulmonic   valve regurgitation.    Dennis Port MD  Electronically signed by [place] Lujean Amel MD  Signature Date/Time: 02/21/2014/3:11:18 PM  *** Final ***    IMPRESSION: .        Verified By: Yolonda Kida, M.D., MD   Assessment/Plan:  Assessment/Plan:  Assessment 1) nausea epigastric pain and weight loss.   2)  chest pain and sob-cath results noted.   Plan 1) will need luminal evaluation egd versus UGIS, and CT abdomen with evaluation of mesenterics. Will discuss with Dr Clayborn Bigness.   Electronic Signatures: Loistine Simas (MD)  (Signed 20-Apr-15 19:30)  Authored: Chief Complaint, VITAL SIGNS/ANCILLARY NOTES, Brief Assessment, Lab Results, Radiology Results, Assessment/Plan   Last Updated: 20-Apr-15 19:30 by Loistine Simas (MD)

## 2015-02-28 NOTE — Consult Note (Signed)
Chief Complaint:  Subjective/Chief Complaint seen for nausea and weight loss.  continues with nausea, poor appetite.  mild epigastric pain.   VITAL SIGNS/ANCILLARY NOTES: **Vital Signs.:   21-Apr-15 12:13  Vital Signs Type Routine  Temperature Temperature (F) 98  Temperature Source oral  Pulse Pulse 91  Respirations Respirations 18  Systolic BP Systolic BP 97  Diastolic BP (mmHg) Diastolic BP (mmHg) 65  Mean BP 75  Pulse Ox % Pulse Ox % 97  Pulse Ox Activity Level  At rest  Oxygen Delivery Room Air/ 21 %   Brief Assessment:  Cardiac Regular   Respiratory clear BS   Gastrointestinal details normal Soft  Nondistended  No masses palpable  Bowel sounds normal  No rebound tenderness  mild epigastric tenderness   Lab Results: Routine Chem:  21-Apr-15 05:03   Glucose, Serum  150  BUN 13  Creatinine (comp) 0.81  Sodium, Serum 138  Potassium, Serum 3.6  Chloride, Serum 103  CO2, Serum 30  Calcium (Total), Serum  8.1  Anion Gap  5  Osmolality (calc) 279  eGFR (African American) >60  eGFR (Non-African American) >60 (eGFR values <60mL/min/1.73 m2 may be an indication of chronic kidney disease (CKD). Calculated eGFR is useful in patients with stable renal function. The eGFR calculation will not be reliable in acutely ill patients when serum creatinine is changing rapidly. It is not useful in  patients on dialysis. The eGFR calculation may not be applicable to patients at the low and high extremes of body sizes, pregnant women, and vegetarians.)  Routine Hem:  21-Apr-15 05:03   WBC (CBC)  3.4  RBC (CBC) 4.00  Hemoglobin (CBC) 12.1  Hematocrit (CBC) 37.3  Platelet Count (CBC)  89  MCV 93  MCH 30.3  MCHC 32.5  RDW  14.6  Neutrophil % 44.3  Lymphocyte % 40.5  Monocyte % 13.9  Eosinophil % 1.0  Basophil % 0.3  Neutrophil # 1.5  Lymphocyte # 1.4  Monocyte # 0.5  Eosinophil # 0.0  Basophil # 0.0 (Result(s) reported on 25 Feb 2014 at 05:48AM.)   Assessment/Plan:   Assessment/Plan:  Assessment 1) weight loss, nausea 2)severely depressed LV function, ef<20%   Plan 1) recommend CTA to be followed by upper gi series/sbs when clinically feasible.  Poor candidate for sedated proceedure currenty. following.   Electronic Signatures: Skulskie, Martin (MD)  (Signed 21-Apr-15 20:31)  Authored: Chief Complaint, VITAL SIGNS/ANCILLARY NOTES, Brief Assessment, Lab Results, Assessment/Plan   Last Updated: 21-Apr-15 20:31 by Skulskie, Martin (MD) 

## 2015-02-28 NOTE — Consult Note (Signed)
PATIENT NAME:  Bethany [name], Bethany [name] MR#:  [id number] DATE OF BIRTH:  [birth year]  DATE OF CONSULTATION:  09/20/2014  REFERRING PHYSICIAN:   CONSULTING PHYSICIAN:  Hendrixx Severin P. Benjie Karvonen, MD  PRIMARY CARDIOLOGIST: Corey Skains, MD.   PRIMARY CARE PHYSICIAN: Dr. Venia Minks.    REFERRING PHYSICIAN: Dr. Edd Fabian.   REASON FOR CONSULTATION: Chest pain.   HISTORY OF PRESENT ILLNESS: A 70 year old female with a history of cardiomyopathy, EF of 25%, who underwent a cardiac catheterization April 2015 which showed normal coronary arteries with an EF of 25%. She presents with chest pain. Apparently, she was seen yesterday in the CHF clinic for chest pain as well. After routine lab work including a troponin and EKG, she was sent home and did come to the ER for further evaluation as she continued to have chest pain. She describes her chest pain as left-sided, does not radiate, is constantly there, no relieving factors. She does say it is worse with exertion. She has had chest pain on and off several times. She also has shortness of breath at baseline which comes as goes as well. She has had chest pain on and off several times. She also has shortness of breath at baseline which comes and goes as well. She is currently in cardiac rehab and when she is there she does not have any chest pain.    REVIEW OF SYSTEMS: CONSTITUTIONAL: No fever, fatigue, weakness. EYES: No blurred, double vision or glaucoma.  ENT: No ear pain, hearing loss, seasonal allergies.   RESPIRATORY: No cough, wheezing, hemoptysis, COPD.   CARDIOVASCULAR: Positive chest pain. She cannot lay flat at baseline. No palpitations, syncope.   GASTROINTESTINAL: No nausea, vomiting, diarrhea, abdominal pain, melena, or ulcers.  GENITOURINARY: No dysuria or hematuria.   ENDOCRINE: No polyuria or polydipsia.  HEMATOLOGIC AND LYMPHATIC: No easy bruising or bleeding.  SKIN: No rash or lesion.  MUSCULOSKELETAL: No limited activity. NEUROLOGICAL: No history of CVA,  TIAs or seizures.   PSYCHIATRIC: No history of anxiety or depression.   PAST MEDICAL HISTORY: 1.  Thyroid problems.  2.  Congestive heart failure, nonischemic with EF of 25%. The patient underwent a cardiac catheterization April 2015 which showed normal coronary arteries and EF of 25%.  3.  Diabetes.  4.  GERD.   PAST SURGICAL HISTORY:  1.  Cardiac catheterization.  2.  Bone marrow biopsy.  3.  Gallbladder removal.  4.  Partial hysterectomy.   MEDICATIONS: 1.  Synthroid 50 mcg daily.  2.  Aldactone 25 mg daily.  3.  Pantoprazole 40 mg daily.   4.  Metoprolol 25 mg 1/2 tablet b.i.d.  5.  Metformin 500 b.i.d.  6.  Ferrous sulfate 325 mg b.i.d.  7.  Aspirin 81 mg daily.  8.  Tylenol 500 mg take 2 tablets every 6 hours p.r.n. pain.   ALLERGIES: SULFA CAUSES LOSS OF TASTE.   FAMILY HISTORY: Positive for diabetes.   SOCIAL HISTORY: No tobacco, alcohol or drug use.   PHYSICAL EXAMINATION: VITAL SIGNS: The patient is afebrile. Temperature 97.6, pulse is 74, respirations 16, blood pressure 91/79, 100% on room air.  GENERAL: The patient is alert, oriented, not in acute distress.  HEENT: Head is atraumatic. Pupils are round and reactive. Sclerae anicteric. Mucous membranes are moist. Oropharynx is clear.  NECK: Supple without JVD, carotid bruit, or enlarged thyroid.  CARDIOVASCULAR: Regular rate and rhythm. No murmurs, gallops, or rubs. PMI is not displaced.  LUNGS: Clear to auscultation without crackles, rales, rhonchi or wheezing. Normal  to percussion.  ABDOMEN: Bowel sounds are positive. Nontender, nondistended. No hepatosplenomegaly.  EXTREMITIES: No clubbing, cyanosis, or edema.  NEUROLOGIC: Cranial nerves II through XII  are intact. No focal deficits.  SKIN: Without rash or lesions.   LABORATORIES: Troponin is less than 0.02. Sodium 137, potassium 3.9, chloride 105, bicarbonate 26, BUN 10, creatinine 0.78, glucose is 112. White blood cells 2.8, hemoglobin 12, hematocrit 37.4,  platelets are 107,000. BNP 4552.   EKG shows intraventricular block which is unchanged. No ST elevations or depressions.    Chest x-ray is pending.   ASSESSMENT AND PLAN: This is a 69 year old female who presents with chest pain, underwent a cardiac catheterization April 2015 which showed normal coronary arteries and no evidence of heart disease with ejection fraction of 25%.  1.  Chest pain. This is unlikely cardiac in nature, as patient had a cardiac catheterization just a few months ago which showed no coronary artery disease. At this time, the patient does not need admission into the hospital. Would recommend a consultation with Dr. Nehemiah Massed for outpatient followup. Would also order a second set of troponins. If this is positive, then we will admit the patient.  2.  Nonischemic cardiomyopathy. The patient will continue outpatient medications including Aldactone and metoprolol.  3.  Gastroesophageal reflux disease. The patient should continue on her pantoprazole.  4.  Diabetes. The patient will continue ADA diet, metformin.   Thank you for allowing [place] to participate in the care of the patient.   TIME SPENT ON THIS CONSULTATION: 45 minutes.   Plan of care was discussed with Dr. Edd Fabian.    ____________________________ Donell Beers. Benjie Karvonen, MD spm:at D: 09/20/2014 17:20:18 ET T: 09/20/2014 17:35:57 ET JOB#: [id number]  cc: [place] Troia P. Benjie Karvonen, MD, <Dictator> Corey Skains, MD Jerrell Belfast, MD Donell Beers Azai Gaffin MD ELECTRONICALLY SIGNED 09/20/2014 18:31

## 2015-02-28 NOTE — Consult Note (Signed)
Chief Complaint:  Subjective/Chief Complaint seen for nausea weight loss and epigastric pain. tolerated CTA today.  results noted. states that abd feels better currently and she is not nauseated.   VITAL SIGNS/ANCILLARY NOTES: **Vital Signs.:   22-Apr-15 11:46  Vital Signs Type Routine  Temperature Temperature (F) 97.7  Celsius 36.5  Pulse Pulse 92  Respirations Respirations 18  Systolic BP Systolic BP 95  Diastolic BP (mmHg) Diastolic BP (mmHg) 69  Mean BP 77  Pulse Ox % Pulse Ox % 97  Pulse Ox Activity Level  At rest  Oxygen Delivery Room Air/ 21 %   Brief Assessment:  Cardiac Regular   Respiratory clear BS   Gastrointestinal details normal Soft  Nontender  Nondistended  No masses palpable  Bowel sounds normal   Lab Results: Cardiology:  17-Apr-15 13:17   Echo Doppler REASON FOR EXAM:     COMMENTS:     PROCEDURE: [place] - ECHO DOPPLER COMPLETE(TRANSTHOR)  - Feb 21 2014  1:17PM   RESULT: Echocardiogram Report  Patient Name:   Bethany [name] Date of Exam: 02/21/2014 Medical Rec #:  [id number]          Custom1: Date of Birth:  1946/01/13        Height:       61.0 in Patient Age:    69 years        Weight:       123.0 lb Patient Gender: F               BSA:          1.54 m??  Indications: CHF Sonographer:    Sherrie Sport RDCS Referring Phys: Phillips Climes, S  Summary:  1. Left ventricular ejection fraction, by visual estimation, is <20%.  2. Severely decreased global left ventricular systolic function.  3. Moderately increased left ventricular internal cavity size.  4. Decreased left ventricular septal thickness.  5. Moderately enlarged right ventricle.  6. Moderately reduced RV systolic function.  7. Mildly dilated left atrium.  8. Moderate pleural effusion in the left lateral region.  9. Small pericardial effusion, as described above. 10. Moderate mitral valve regurgitation. 11. Mild aortic regurgitation. 12. Mild aortic valve sclerosis without stenosis. 13. Mild  to moderate pulmonic valve regurgitation. 14. Moderate tricuspid regurgitation. 15. Mildly increased left ventricular posterior wall thickness. 2D AND M-MODE MEASUREMENTS (normal ranges within parentheses): Left Ventricle:          Normal IVSd (2D):      0.69 cm (0.7-1.1) LVPWd (2D):     1.11 cm (0.7-1.1) Aorta/LA:                  Normal LVIDd (2D):     6.01cm (3.4-5.7) Aortic Root (2D): 2.90 cm (2.4-3.7) LVIDs (2D):     5.87 cm           Left Atrium (2D): 3.50 cm (1.9-4.0) LV FS (2D):      2.3 %   (>25%) LV EF (2D):      5.2 %   (>50%)                                   Right Ventricle:                       RVd (2D):        0.45 cm LV SYSTOLIC FUNCTION BY 2D PLANIMETRY (MOD): EF-A4C  View: 18.8 % EF-A2C View: 21.0 % EF-Biplane: 94.7 % LV DIASTOLIC FUNCTION: MV Peak E: 0.44 m/s E/e' Ratio: 7.80 MV Peak A: 0.86 m/s Decel Time: 11mec E/A Ratio: 0.51 SPECTRAL DOPPLER ANALYSIS (where applicable): Mitral Valve: MV P1/2 Time: 28.71 msec MV Area, PHT: 7.66 cm?? Aortic Valve: AoV Max Vel: 0.64 m/s AoV Peak PG: 1.6 mmHg AoV Mean PG: LVOT Vmax: 0.47 m/s LVOT VTI:  LVOT Diameter: 2.00 cm AoV Area, Vmax: 2.31 cm?? AoV Area, VTI:  AoV Area, Vmn: Tricuspid Valve and PA/RV Systolic Pressure: TR Max Velocity: 2.38 m/s RA  Pressure: 5 mmHg RVSP/PASP: 27.6 mmHg Pulmonic Valve: PV Max Velocity: 0.52 m/s PV Max PG: 1.1 mmHg PV Mean PG: Pulmonic Insufficiency: PI EDV: 1.05 m/s PADP: 9.4 mmHg  PHYSICIAN INTERPRETATION: Left Ventricle: The left ventricular internal cavity size was moderately  increased. LV septal wall thickness was decreased. LV posterior wall  thicknesswas mildly increased. Global LV systolic function was severely  decreased. Left ventricular ejection fraction, by visual estimation, is  <20%. Right Ventricle: The right ventricular size is moderately enlarged.  Global RV systolic function is moderately reduced. Left Atrium: The left atrium is mildly dilated. Right Atrium: The  right atrium is normal in size. Pericardium: A small pericardial effusion is present. There is a moderate  pleural effusion in the left lateral region. Mitral Valve: The mitral valve is normal in structure. Moderate mitral  valve regurgitation is seen. Tricuspid Valve: The tricuspid valve is normal. Moderate tricuspid  regurgitation is visualized. The tricuspid regurgitant velocity is 2.38  m/s, and with anassumed right atrial pressure of 5 mmHg, the estimated  right ventricular systolic pressure is normal at 27.6 mmHg. Aortic Valve: The aortic valve is normal. Mild aortic valve sclerosis is  present, with no evidence of aortic valve stenosis. Mild aortic valve  regurgitation is seen. Pulmonic Valve: The pulmonic valve is normal. Mild to moderate pulmonic  valve regurgitation.  1BurkeMD Electronically signed by 1Sheffield LakeDLujean AmelMD Signature Date/Time: 02/21/2014/3:11:18 PM *** Final ***  IMPRESSION: .    Verified By: DYolonda Kida M.D., MD  Routine Chem:  21-Apr-15 05:03   Glucose, Serum  150  BUN 13  Creatinine (comp) 0.81  Sodium, Serum 138  Potassium, Serum 3.6  Chloride, Serum 103  CO2, Serum 30  Calcium (Total), Serum  8.1  Anion Gap  5  Osmolality (calc) 279  eGFR (African American) >60  eGFR (Non-African American) >60 (eGFR values <675mmin/1.73 m2 may be an indication of chronic kidney disease (CKD). Calculated eGFR is useful in patients with stable renal function. The eGFR calculation will not be reliable in acutely ill patients when serum creatinine is changing rapidly. It is not useful in  patients on dialysis. The eGFR calculation may not be applicable to patients at the low and high extremes of body sizes, pregnant women, and vegetarians.)  Routine Hem:  21-Apr-15 05:03   WBC (CBC)  3.4  RBC (CBC) 4.00  Hemoglobin (CBC) 12.1  Hematocrit (CBC) 37.3  Platelet Count (CBC)  89  MCV 93  MCH 30.3  MCHC 32.5  RDW  14.6  Neutrophil  % 44.3  Lymphocyte % 40.5  Monocyte % 13.9  Eosinophil % 1.0  Basophil % 0.3  Neutrophil # 1.5  Lymphocyte # 1.4  Monocyte # 0.5  Eosinophil # 0.0  Basophil # 0.0 (Result(s) reported on 25 Feb 2014 at 05:48AM.)   Radiology Results: Cardiology:    17-Apr-15 13:17, Echo Doppler  Echo Doppler  REASON FOR EXAM:      COMMENTS:       PROCEDURE: [place] - ECHO DOPPLER COMPLETE(TRANSTHOR)  - Feb 21 2014  1:17PM     RESULT: Echocardiogram Report    Patient Name:   Bethany [name] Date of Exam: 02/21/2014  Medical Rec #:  [id number]          Custom1:  Date of Birth:  06/15/1946        Height:       61.0 in  Patient Age:    40 years        Weight:       123.0 lb  Patient Gender: F               BSA:          1.54 m??    Indications: CHF  Sonographer:    Sherrie Sport RDCS  Referring Phys: Phillips Climes, S    Summary:   1. Left ventricular ejection fraction, by visual estimation, is <20%.   2. Severely decreased global left ventricular systolic function.   3. Moderately increased left ventricular internal cavity size.   4. Decreased left ventricular septal thickness.   5. Moderately enlarged right ventricle.   6. Moderately reduced RV systolic function.   7. Mildly dilated left atrium.   8. Moderate pleural effusion in the left lateral region.   9. Small pericardial effusion, as described above.  10. Moderate mitral valve regurgitation.  11. Mild aortic regurgitation.  12. Mild aortic valve sclerosis without stenosis.  13. Mild to moderate pulmonic valve regurgitation.  14. Moderate tricuspid regurgitation.  15. Mildly increased left ventricular posterior wall thickness.  2D AND M-MODE MEASUREMENTS (normal ranges within parentheses):  Left Ventricle:          Normal  IVSd (2D):      0.69 cm (0.7-1.1)  LVPWd (2D):     1.11 cm (0.7-1.1) Aorta/LA:                  Normal  LVIDd (2D):     6.01cm (3.4-5.7) Aortic Root (2D): 2.90 cm (2.4-3.7)  LVIDs (2D):     5.87 cm           Left Atrium  (2D): 3.50 cm (1.9-4.0)  LV FS (2D):      2.3 %   (>25%)  LV EF (2D):      5.2 %   (>50%)                                    Right Ventricle:                        RVd (2D):        3.23 cm  LV SYSTOLIC FUNCTION BY 2D PLANIMETRY (MOD):  EF-A4C View: 18.8 % EF-A2C View: 21.0 % EF-Biplane: 55.7 %  LV DIASTOLIC FUNCTION:  MV Peak E: 0.44 m/s E/e' Ratio: 7.80  MV Peak A: 0.86 m/s Decel Time: 69mec  E/A Ratio: 0.51  SPECTRAL DOPPLER ANALYSIS (where applicable):  Mitral Valve:  MV P1/2 Time: 28.71 msec  MV Area, PHT: 7.66 cm??  Aortic Valve: AoV Max Vel: 0.64 m/s AoV Peak PG: 1.6 mmHg AoV Mean PG:  LVOT Vmax: 0.47 m/s LVOT VTI:  LVOT Diameter: 2.00 cm  AoV Area, Vmax: 2.31 cm?? AoV Area, VTI:  AoV Area, Vmn:  Tricuspid Valve  and PA/RV Systolic Pressure: TR Max Velocity: 2.38 m/s RA   Pressure: 5 mmHg RVSP/PASP: 27.6 mmHg  Pulmonic Valve:  PV Max Velocity: 0.52 m/s PV Max PG: 1.1 mmHg PV Mean PG:  Pulmonic Insufficiency:  PI EDV: 1.05 m/s PADP: 9.4 mmHg    PHYSICIAN INTERPRETATION:  Left Ventricle: The left ventricular internal cavity size was moderately   increased. LV septal wall thickness was decreased. LV posterior wall   thicknesswas mildly increased. Global LV systolic function was severely   decreased. Left ventricular ejection fraction, by visual estimation, is   <20%.  Right Ventricle: The right ventricular size is moderately enlarged.   Global RV systolic function is moderately reduced.  Left Atrium: The left atrium is mildly dilated.  Right Atrium: The right atrium is normal in size.  Pericardium: A small pericardial effusion is present. There is a moderate   pleural effusion in the left lateral region.  Mitral Valve: The mitral valve is normal in structure. Moderate mitral   valve regurgitation is seen.  Tricuspid Valve: The tricuspid valve is normal. Moderate tricuspid   regurgitation is visualized. The tricuspid regurgitant velocity is 2.38   m/s, and with anassumed  right atrial pressure of 5 mmHg, the estimated   right ventricular systolic pressure is normal at 27.6 mmHg.  Aortic Valve: The aortic valve is normal. Mild aortic valve sclerosis is   present, with no evidence of aortic valve stenosis. Mild aortic valve   regurgitation is seen.  Pulmonic Valve: The pulmonic valve is normal. Mild to moderate pulmonic   valve regurgitation.    Scott MD  Electronically signed by Latimer MD  Signature Date/Time: 02/21/2014/3:11:18 PM  *** Final ***    IMPRESSION: .        Verified By: Yolonda Kida, M.D., MD  CT:    22-Apr-15 16:15, CT Angiography Abdomen/Pelvis W/WO Contrast  CT Angiography Abdomen/Pelvis W/WO Contrast   REASON FOR EXAM:    weight loss, nausea, look for mesenteric ischemia.  COMMENTS:       PROCEDURE: CT  - CT ANGIOGRAPHY ABD/PEL W/WO  - Feb 26 2014  4:15PM     CLINICAL DATA:  Weight loss, nausea, possible  mesenteric ischemia    EXAM:  CT ANGIOGRAPHY ABDOMEN AND PELVIS    TECHNIQUE:  Multidetector CT imaging of the abdomen and pelvis was performed  using the standard protocol during bolus administration of  intravenous contrast. Multiplanar reconstructed images including  MIPs were obtained and reviewed to evaluate the vascular anatomy.  CONTRAST:  100 mL Isovue 370 IV    FINDINGS:  ARTERIAL FINDINGS:    Aorta: Mild tortuosity. No significant atheromatous irregularity,  dissection, aneurysm, or stenosis.    Celiac axis: Mild Short segment narrowing at the level of median  arcuate ligament of the diaphragm, patent distally.    Superior mesenteric: Widely patent, with classic distal branch  anatomy.    Left renal: Duplicated. Superior is diminutive. Inferior left renal  artery dominant, widely patent.    Right renal: Single, with partially calcified nonocclusive ostial  plaque, patent distally.    Inferior mesenteric:  Patent    Left iliac:           Unremarkable    Right iliac:           Unremarkable    Venous findings: Patent portal vein, superior mesenteric vein,  splenic vein, bilateral renal veins. Refluxing dilated left ovarian  vein. Incomplete opacification of IVC and hepatic  veins probably  secondary to portal venous phase timing.  Review of the MIP images confirms the above findings.    Nonvascular findings: Minimal dependent atelectasis in the  visualized lung bases. Small pleural effusions left greater than  right and pericardial effusion, decreased since 02/21/2014. Left  ventricular enlargement. Surgical clips in the gallbladder fossa.  Unremarkable liver, spleen, adrenal glands, kidneys, pancreas. The  stomach, small bowel, and colon are nondilated. There is a small  amount of pelvic ascites. Urinary bladder is incompletely distended.  Uterus is absent. No free air. No adenopathy localized. Bilateral  facet DJD at L5-S1 allows early grade 1 anterolisthesis.     IMPRESSION:  1. Mild proximal celiac axis narrowing, which in the setting of  widely patent superior and inferior mesenteric arteries is  inadequate to account for occlusive mesenteric ischemia.  2. Smaller pericardial effusion and pleural effusions.  3. Small amount of pelvic ascites.      Electronically Signed    By: Arne Cleveland M.D.    On: 02/26/2014 17:00         Verified By: Kandis Cocking, M.D.,   Assessment/Plan:  Assessment/Plan:  Assessment 1) nausea, reported as daily for several months with weight loss and intermittant epigastric pain.  CTA done negative for mesenteric insufficiency. 2) severely depressed cardiac function with EF less than 20%.  admitted with atypical chest pain   Plan 1) high risk for sedated proceedure, will do UGIS/sbs as per impressions in consult.  following.   Electronic Signatures: Loistine Simas (MD)  (Signed 22-Apr-15 17:50)  Authored: Chief Complaint, VITAL SIGNS/ANCILLARY NOTES, Brief Assessment, Lab Results, Radiology Results,  Assessment/Plan   Last Updated: 22-Apr-15 17:50 by Loistine Simas (MD)

## 2015-02-28 NOTE — Consult Note (Signed)
Chief Complaint:  Subjective/Chief Complaint Lying bed resting . Ambulating to the bath room well. Still DOE.mild cp.   VITAL SIGNS/ANCILLARY NOTES: **Vital Signs.:   19-Apr-15 16:30  Vital Signs Type Pre Medication  Pulse Pulse 85  Systolic BP Systolic BP 740  Diastolic BP (mmHg) Diastolic BP (mmHg) 65  Mean BP 81  *Intake and Output.:   19-Apr-15 16:41  Grand Totals Intake:   Output:  0    Net:  0 83 Hr.:  -1630  Urine ml     Out:  0   Brief Assessment:  GEN well developed, well nourished, no acute distress, thin   Cardiac Regular  murmur present  -- LE edema  + JVD  --Gallop   Respiratory normal resp effort  clear BS   Gastrointestinal Normal   Gastrointestinal details normal Soft  Bowel sounds normal   EXTR negative cyanosis/clubbing, negative edema   Lab Results: Routine Chem:  18-Apr-15 04:31   Creatinine (comp) 0.95  eGFR (African American) >60  eGFR (Non-African American) >60 (eGFR values <59m/min/1.73 m2 may be an indication of chronic kidney disease (CKD). Calculated eGFR is useful in patients with stable renal function. The eGFR calculation will not be reliable in acutely ill patients when serum creatinine is changing rapidly. It is not useful in  patients on dialysis. The eGFR calculation may not be applicable to patients at the low and high extremes of body sizes, pregnant women, and vegetarians.)  BUN  21  Sodium, Serum 141  Potassium, Serum 3.9  Chloride, Serum 106  CO2, Serum 26  Calcium (Total), Serum 8.9  Anion Gap 9  Osmolality (calc) 284  Magnesium, Serum  1.6 (1.8-2.4 THERAPEUTIC RANGE: 4-7 mg/dL TOXIC: > 10 mg/dL  -----------------------)  Routine Hem:  18-Apr-15 04:31   Platelet Count (CBC)  106 (Result(s) reported on 22 Feb 2014 at 05:45AM.)  WBC (CBC) 3.6  RBC (CBC) 4.32  Hemoglobin (CBC) 12.8  Hematocrit (CBC) 40.2  MCV 93  MCH 29.6  MCHC  31.8  RDW 14.5  Segmented Neutrophils 20  Lymphocytes 68  Variant Lymphocytes 4   Monocytes 7  Eosinophil 1  Diff Comment 1 ANISOCYTOSIS  Diff Comment 2 LARGE PLATELETS  Diff Comment 3 PLTS VARIED IN SIZE  Result(s) reported on 22 Feb 2014 at 05:45AM.   Radiology Results: XRay:    16-Apr-15 20:24, Chest PA and Lateral  Chest PA and Lateral   REASON FOR EXAM:    SOB  COMMENTS:       PROCEDURE: DXR - DXR CHEST PA (OR AP) AND LATERAL  - Feb 20 2014  8:24PM     CLINICAL DATA:  cough, sob, weakness    EXAM:  CHEST  2 VIEW    COMPARISON:  None.    FINDINGS:  The cardiac silhouette is enlarged.Small bilateral effusions  appreciated. Areas of increased density in the lung bases. The  osseous structures unremarkable.     IMPRESSION:  Atelectasis versus infiltrate within the lung bases    Small bilateral effusions    Cardiomegaly      Electronically Signed    By: HMargaree MackintoshM.D.    On: 02/20/2014 20:58       Verified By: HMikki Santee M.D., MD  Cardiology:    16-Apr-15 20:01, ED ECG  Ventricular Rate 114  Atrial Rate 114  P-R Interval 122  QRS Duration 130  QT 370  QTc 509  P Axis 68  R Axis -118  T Axis 60  ECG interpretation   Sinus tachycardia with occasional Premature ventricular complexes  Possible Left atrial enlargement  Right superior axis deviation  Non-specific intra-ventricular conduction block  Abnormal ECG  When compared with ECG of 02-Apr-2013 10:24,  Prematureventricular complexes are now Present  Questionable change in QRS axis  ----------unconfirmed----------  Confirmed by OVERREAD, NOT ([id number]), editor PEARSON, BARBARA ([id number]) on 02/21/2014 2:33:29 PM  ED ECG     17-Apr-15 13:17, Echo Doppler  Echo Doppler   REASON FOR EXAM:      COMMENTS:       PROCEDURE: [place] - ECHO DOPPLER COMPLETE(TRANSTHOR)  - Feb 21 2014  1:17PM     RESULT: Echocardiogram Report    Patient Name:   Bethany [name] Date of Exam: 02/21/2014  Medical Rec #:  [id number]          Custom1:  Date of Birth:  12/15/1945        Height:       61.0  in  Patient Age:    69 years        Weight:       123.0 lb  Patient Gender: F               BSA:          1.54 m??    Indications: CHF  Sonographer:    Sherrie Sport RDCS  Referring Phys: Phillips Climes, S    Summary:   1. Left ventricular ejection fraction, by visual estimation, is <20%.   2. Severely decreased global left ventricular systolic function.   3. Moderately increased left ventricular internal cavity size.   4. Decreased left ventricular septal thickness.   5. Moderately enlarged right ventricle.   6. Moderately reduced RV systolic function.   7. Mildly dilated left atrium.   8. Moderate pleural effusion in the left lateral region.   9. Small pericardial effusion, as described above.  10. Moderate mitral valve regurgitation.  11. Mild aortic regurgitation.  12. Mild aortic valve sclerosis without stenosis.  13. Mild to moderate pulmonic valve regurgitation.  14. Moderate tricuspid regurgitation.  15. Mildly increased left ventricular posterior wall thickness.  2D AND M-MODE MEASUREMENTS (normal ranges within parentheses):  Left Ventricle:          Normal  IVSd (2D):      0.69 cm (0.7-1.1)  LVPWd (2D):     1.11 cm (0.7-1.1) Aorta/LA:                  Normal  LVIDd (2D):     6.01cm (3.4-5.7) Aortic Root (2D): 2.90 cm (2.4-3.7)  LVIDs (2D):     5.87 cm           Left Atrium (2D): 3.50 cm (1.9-4.0)  LV FS (2D):      2.3 %   (>25%)  LV EF (2D):      5.2 %   (>50%)                                    Right Ventricle:                        RVd (2D):        1.54 cm  LV SYSTOLIC FUNCTION BY 2D PLANIMETRY (MOD):  EF-A4C View: 18.8 % EF-A2C View: 21.0 % EF-Biplane: 00.8 %  LV DIASTOLIC FUNCTION:  MV Peak E: 0.44 m/s  E/e' Ratio: 7.80  MV Peak A: 0.86 m/s Decel Time: 62mec  E/A Ratio: 0.51  SPECTRAL DOPPLER ANALYSIS (where applicable):  Mitral Valve:  MV P1/2 Time: 28.71 msec  MV Area, PHT: 7.66 cm??  Aortic Valve: AoV Max Vel: 0.64 m/s AoV Peak PG: 1.6 mmHg AoV Mean  PG:  LVOT Vmax: 0.47 m/s LVOT VTI:  LVOT Diameter: 2.00 cm  AoV Area, Vmax: 2.31 cm?? AoV Area, VTI:  AoV Area, Vmn:  Tricuspid Valve and PA/RV Systolic Pressure: TR Max Velocity: 2.38 m/s RA   Pressure: 5 mmHg RVSP/PASP: 27.6 mmHg  Pulmonic Valve:  PV Max Velocity: 0.52 m/s PV Max PG: 1.1 mmHg PV Mean PG:  Pulmonic Insufficiency:  PI EDV: 1.05 m/s PADP: 9.4 mmHg    PHYSICIAN INTERPRETATION:  Left Ventricle: The left ventricular internal cavity size was moderately   increased. LV septal wall thickness was decreased. LV posterior wall   thicknesswas mildly increased. Global LV systolic function was severely   decreased. Left ventricular ejection fraction, by visual estimation, is   <20%.  Right Ventricle: The right ventricular size is moderately enlarged.   Global RV systolic function is moderately reduced.  Left Atrium: The left atrium is mildly dilated.  Right Atrium: The right atrium is normal in size.  Pericardium: A small pericardial effusion is present. There is a moderate   pleural effusion in the left lateral region.  Mitral Valve: The mitral valve is normal in structure. Moderate mitral   valve regurgitation is seen.  Tricuspid Valve: The tricuspid valve is normal. Moderate tricuspid   regurgitation is visualized. The tricuspid regurgitant velocity is 2.38   m/s, and with anassumed right atrial pressure of 5 mmHg, the estimated   right ventricular systolic pressure is normal at 27.6 mmHg.  Aortic Valve: The aortic valve is normal. Mild aortic valve sclerosis is   present, with no evidence of aortic valve stenosis. Mild aortic valve   regurgitation is seen.  Pulmonic Valve: The pulmonic valve is normal. Mild to moderate pulmonic   valve regurgitation.    1Blue Ridge SummitMD  Electronically signed by 1Neah BayMD  Signature Date/Time: 02/21/2014/3:11:18 PM  *** Final ***    IMPRESSION: .        Verified By: DYolonda Kida M.D., MD    Assessment/Plan:  Assessment/Plan:  Assessment IMP UCanadaCHF systolic dysfunction CM DM Hypothyroidism Low Plt Abd pain .   Plan PLAN ASA 81 mg daily Lovenox subcutaneous pre-op cath Lasix IV for CHF ACE/B-blocker for CM Agree with ECHO  DM continue current therapy GI w/u afher cath   Electronic Signatures: CYolonda Kida(MD)  (Signed 20-Apr-15 13:35)  Authored: Chief Complaint, VITAL SIGNS/ANCILLARY NOTES, Brief Assessment, Lab Results, Radiology Results, Assessment/Plan   Last Updated: 20-Apr-15 13:35 by CYolonda Kida(MD)

## 2015-02-28 NOTE — Consult Note (Signed)
PATIENT NAME:  Bethany [name], Bethany [name] MR#:  [id number] DATE OF BIRTH:  08/24/1946  DATE OF CONSULTATION:  02/21/2014  REFERRING PHYSICIAN:  Leo Grosser, MD CONSULTING PHYSICIAN/CARDIOLOGIST:  Dwayne D. Callwood, MD  INDICATION: Chest pain, shortness of breath.   HISTORY OF PRESENT ILLNESS: The patient is a 69 year old female with past history of diabetes, hypothyroidism, and reflux, presents with chest pain symptoms. The patient was recently seen by cardiologist with shortness of breath, planned to have a stress test done and an outpatient echocardiogram. The patient states the symptoms got worse over the last 1 to 2 months, worse yesterday, so she finally came to the Emergency Room with midsternal driving, nonradiating chest pain. She states it has been happening all the time, some at rest and some with exertion. Sometimes she wakes up in the middle of the night short of breath. She is not able to engage in any activities without getting short-winded. She has had some mild leg edema, minimal cough, no real sweating, just complains of severe dyspnea. Came to the Emergency Room and was found to have borderline troponin. EKG with intraventricular conduction delay, nonspecific findings. CT of the chest was negative. There was evidence of cardiomegaly and pleural effusions. The patient was subsequently advised to be admitted for further evaluation and care.   PAST MEDICAL HISTORY: Diabetes, hypothyroidism, reflux.   FAMILY HISTORY: CVA, diabetes, coronary artery disease.   SOCIAL HISTORY: No smoking. No alcohol consumption. Denies drug use.   ALLERGIES: SULFA DRUGS.   MEDICATIONS: Claritin 10 mg a day, Synthroid 50 mcg daily, Januvia 100 mg daily, Prilosec once a day over-the-counter.   REVIEW OF SYSTEMS: She denies blackout spells or syncope. There is no significant nausea, vomiting, fever, chills, or sweats. No weight loss. No weight gain. No hemoptysis or hematemesis. No bright red blood per rectum.  She has had shortness of breath, PND. She has had orthopnea, dyspnea on exertion, and some chest pain.   PHYSICAL EXAMINATION: VITAL SIGNS: Blood pressure at the time I saw her was 115/80, pulse of 100, respiratory rate of 18, afebrile.  HEENT: Normocephalic, atraumatic. Pupils equal and reactive to light.  NECK: Supple. She had mild JVD. No bruits or adenopathy.  LUNGS: Decreased breath sounds in the bases, rales in both bases, dullness in the bases with rhonchi. No wheezing.  HEART: Regular rate. She is slightly tachycardic. Systolic ejection murmur at the apex. Positive S3. Soft S4. PMI displaced laterally.  ABDOMEN: Benign.  EXTREMITIES: Within normal limits.  NEUROLOGIC: Grossly intact.  SKIN: Normal.   LABORATORY, DIAGNOSTIC, AND RADIOLOGICAL DATA: Glucose of 140, BNP 9422, BUN 18, creatinine 0.99, sodium 142, potassium 4.2, chloride 110. Troponin 0.05, increased to 0.08. White count 4, hemoglobin 13, hematocrit 40, platelet count of 114.   CT of the chest: No pulmonary embolus but pleural effusion, mild pericardial effusion.   EKG: Sinus rhythm, tachycardic, intraventricular conduction delay or heart block, nonspecific findings.   ASSESSMENT: Possible heart failure, shortness of breath, possible acute coronary syndrome, diabetes, hypothyroidism, possible pericardial effusion, possible cardiomyopathy.   PLAN: 1.  Agree with admit. Rule out for myocardial infarction. Follow cardiac enzymes. I am concerned that this may be demand ischemia, but with the chest pain,  further evaluation I think is necessary. Will probably consider functional study or cardiac cath depending on other factors. Hopefully the chest pain can be improved with medication, hopefully low-dose beta blockers and/or ACE inhibitors in addition to nitrates.  2.  What appears to be mild heart  failure with bilateral pleural effusion. Will consider ACE inhibitor therapy. Consider echocardiogram. Low-dose beta blockers.  Diuretics also will be helpful. Consider adding these. Will try to assess whether this is a cardiomyopathy and then if so, ischemic or nonischemic, and base evaluation on that.  3.  For shortness of breath, continue diuretics. Probably add oxygen therapy, ACE inhibitor as well.  4.  For diabetes, continue diabetes medication for therapy. 5.  For thyroid disease, continue Synthroid. 6.  There is no evidence of pulmonary embolus. Will continue DVT prophylaxis. 7.  For pericardial effusion, recommend echocardiogram since effusion was seen on CT. 8.  Again, I am concerned that the patient has a significant cardiomyopathy. Echocardiogram will be helpful  or cardiac cath to help to determine whether it is ischemic or nonischemic in nature.   ____________________________ Bobbie Stack Juliann Pares, MD ddc:jcm D: 02/21/2014 15:58:21 ET T: 02/21/2014 19:26:08 ET JOB#: [id number]  cc: Dwayne D. Juliann Pares, MD, <Dictator> Alwyn Pea MD ELECTRONICALLY SIGNED 04/01/2014 13:55

## 2015-02-28 NOTE — Consult Note (Signed)
PATIENT NAME:  BERLIN, JERUE MR#:  [id number] DATE OF BIRTH:  [birth year]  DATE OF CONSULTATION:  02/23/2014  REFERRING PHYSICIAN:  Dr Nemiah Commander CONSULTING PHYSICIAN:  Christena Deem, MD  REASON FOR CONSULTATION: Weight loss and epigastric pain.   HISTORY OF PRESENT ILLNESS: The patient is a very pleasant 69 year old African American female who was admitted 2 days ago with complaint of chest pain. There was concern for acute coronary syndrome/non-ST elevation MI as the patient presented with chest pain and shortness of breath. She was seen in consultation by Dr. Juliann Pares and he agreed with the admission and to rule out for an MI. He has also recommended further evaluation via cardiac catheterization tomorrow.   GI has been consulted in regards to issue of weight loss and epigastric pain. The patient states that she has been experiencing nausea with eating and a sensation of early satiety.  There has been no vomiting. She does relate an epigastric pain that seems to radiate to the periumbilical region. This has been occurring on a daily basis for at least over a month.  She relates some cervical type dysphagia. She does not regurgitate foods. That symptom has been going on for about a month as well. She does have a history of gastroesophageal reflux for which she was placed on omeprazole several years ago; however, she had stopped that but was restarted by her primary physician about a week ago. She is unsure as to whether that has made any difference. She does have a daily bowel movement. They have been looser recently. There is no blood or black stool. She does see mucousy stools. She had a colonoscopy done on 05/07/2013 due to an "abnormal CT of the gastrointestinal tract," this showing a normal colonoscopy. She was recommended to return to the GI clinic as needed. There was no diverticulosis. Evidently, the CT scan was one that had been done on 04/02/2013 due to abdominal pain, this showing diffuse  bowel wall thickening involving the mid to distal sigmoid region and inflammatory regions around this. The impression on the scan was colitis in the sigmoid colon. Surveillance was recommended. The patient states that she rarely takes NSAIDs. Her appetite has been decreased for several months. She has had about a 12-pound weight loss over the past couple of months.  GASTROINTESTINAL FAMILY HISTORY: Pertinent for gastric ulcer, negative for colorectal cancer or liver disease.   PAST MEDICAL HISTORY: Type 2 diabetes, hypothyroidism, and gastroesophageal reflux.   SOCIAL HISTORY: The patient does not use alcohol. She does not smoke. No illicit drug use.   REVIEW OF SYSTEMS: Ten systems reviewed per admission history and physical, agree with same.   PHYSICAL EXAMINATION:  VITAL SIGNS: Temperature is 97.6, pulse 86, respirations 18, blood pressure 106/75, pulse oximetry 95%.  GENERAL: She is a 69 year old African American female, no acute distress.  HEENT: Normocephalic, atraumatic.  EYES: Anicteric.  HEART: Regular rate and rhythm.  LUNGS: Clear.  ABDOMEN: Soft. There is some mild discomfort to palpation in the epigastric region extending toward the umbilicus. There are no masses, rebound, or organomegaly.  EXTREMITIES: No clubbing, cyanosis, or edema.  NEUROLOGICAL: Cranial nerves II-XII grossly intact. Muscle strength bilaterally equal and symmetric. DTRs bilaterally equal and symmetric.   LABORATORY AND RADIOLOGICAL DATA: Laboratories include the following.  On admission to the hospital, she had a glucose of 140, a BNP of 9422, BUN 18, creatinine 0.99, sodium 142, potassium 4.2. She had troponins x 3 at 0.05, 0.08, 0.09. Her hemogram on  admission showed a white count of 4.0, H and H 13.1/40.4, platelet count of 114,000. Her cholesterol was 130, triglycerides 94, HDL 42, hemoglobin A1c 6.3. A repeat hemogram yesterday showed a white count of 3.6 and a platelet count of 106,000. She had an echo  Doppler showing a left ventricular EF of less than 20% severely decreased global left ventricular systolic function, mildly dilated left atrium, moderately reduced RV systolic function, moderately enlarged right ventricle, moderate pleural effusion on the left, small pericardial effusion, moderate mitral valve regurgitation, mild aortic regurgitation, mild aortic valve sclerosis without stenosis as well as pulmonic valve regurgitation.   ASSESSMENT:  1. Patient with hospitalization for chest pain and shortness of breath. Cardiomyopathy/very poor ejection fraction noted on echocardiogram and plans for cardiac catheterization tomorrow noted.  2. In regard to her GI symptoms of epigastric pain and weight loss and periumbilical pain, differential diagnosis would include peptic ulcer disease; however, further concern would also be for mesenteric ischemia as well. This would be exacerbated by poor ejection fraction of the heart.   RECOMMENDATION:  1. Awaiting further cardiology evaluation.  2. The patient did have a CT in 2014; however, there was no comment on this scan in regard to mesenteric aortic takeoffs. Would recommend once she is done with her cardiology evaluation that she undergo a CT scan of the abdomen and pelvis with contrast/CTA for evaluation of possible mesenteric ischemia/mesenteric stenosis. Further, would recommend luminal evaluation via EGD as clinically feasible.  3. Would continue a proton pump inhibitor daily.   We will follow with you. Thank you for this consult.   ____________________________ Christena Deem, MD mus:dd D: 02/23/2014 18:05:00 ET T: 02/23/2014 18:59:15 ET JOB#: [id number]  cc: Christena Deem, MD, <Dictator> Christena Deem MD ELECTRONICALLY SIGNED 03/06/2014 11:38

## 2015-03-06 ENCOUNTER — Telehealth: Payer: Self-pay | Admitting: *Deleted

## 2015-03-06 DIAGNOSIS — Z79899 Other long term (current) drug therapy: Secondary | ICD-10-CM

## 2015-03-06 NOTE — Telephone Encounter (Signed)
SEE HOW her BP is;  If its ok ( >100) , we can start at 25   And then check a bmet in about 2 weeks

## 2015-03-06 NOTE — Telephone Encounter (Signed)
Patient would like to know if she should continue to take the losartan. Please advise. Thanks, MI

## 2015-03-09 ENCOUNTER — Encounter: Payer: Self-pay | Admitting: Internal Medicine

## 2015-03-10 ENCOUNTER — Telehealth: Payer: Self-pay

## 2015-03-10 NOTE — Telephone Encounter (Signed)
Patient started this medication at hospital discharge last month.  She has been taking this medication as instructed. Reported blood pressures for last five days:  110/71, 112/74, 117/74, 118/72, 110/69. Patient advised to continue medication. Advised on need for follow up lab work. Patient is agreeable and asking to go to [facility] office for this lab. Informed her that I would have their office call her to arrange this. She is agreeable.

## 2015-03-10 NOTE — Telephone Encounter (Signed)
L mom for pt to schedule lab appt(per Sherri in [place]) within a week

## 2015-03-11 DIAGNOSIS — E876 Hypokalemia: Secondary | ICD-10-CM | POA: Insufficient documentation

## 2015-03-11 DIAGNOSIS — E119 Type 2 diabetes mellitus without complications: Secondary | ICD-10-CM | POA: Insufficient documentation

## 2015-03-11 DIAGNOSIS — E559 Vitamin D deficiency, unspecified: Secondary | ICD-10-CM | POA: Insufficient documentation

## 2015-03-11 DIAGNOSIS — D649 Anemia, unspecified: Secondary | ICD-10-CM | POA: Insufficient documentation

## 2015-03-11 DIAGNOSIS — E039 Hypothyroidism, unspecified: Secondary | ICD-10-CM | POA: Insufficient documentation

## 2015-03-11 DIAGNOSIS — I5020 Unspecified systolic (congestive) heart failure: Secondary | ICD-10-CM | POA: Insufficient documentation

## 2015-03-11 DIAGNOSIS — R5383 Other fatigue: Secondary | ICD-10-CM | POA: Insufficient documentation

## 2015-03-11 DIAGNOSIS — M810 Age-related osteoporosis without current pathological fracture: Secondary | ICD-10-CM | POA: Insufficient documentation

## 2015-03-11 DIAGNOSIS — K219 Gastro-esophageal reflux disease without esophagitis: Secondary | ICD-10-CM | POA: Insufficient documentation

## 2015-03-11 DIAGNOSIS — J309 Allergic rhinitis, unspecified: Secondary | ICD-10-CM | POA: Insufficient documentation

## 2015-03-11 DIAGNOSIS — R0602 Shortness of breath: Secondary | ICD-10-CM | POA: Insufficient documentation

## 2015-03-11 DIAGNOSIS — R748 Abnormal levels of other serum enzymes: Secondary | ICD-10-CM | POA: Insufficient documentation

## 2015-03-11 DIAGNOSIS — E538 Deficiency of other specified B group vitamins: Secondary | ICD-10-CM | POA: Insufficient documentation

## 2015-03-11 DIAGNOSIS — Z8768 Personal history of other (corrected) conditions arising in the perinatal period: Secondary | ICD-10-CM | POA: Insufficient documentation

## 2015-03-11 DIAGNOSIS — R531 Weakness: Secondary | ICD-10-CM | POA: Insufficient documentation

## 2015-03-11 DIAGNOSIS — F329 Major depressive disorder, single episode, unspecified: Secondary | ICD-10-CM | POA: Insufficient documentation

## 2015-03-11 DIAGNOSIS — E7800 Pure hypercholesterolemia, unspecified: Secondary | ICD-10-CM | POA: Insufficient documentation

## 2015-03-11 DIAGNOSIS — K589 Irritable bowel syndrome without diarrhea: Secondary | ICD-10-CM | POA: Insufficient documentation

## 2015-03-11 DIAGNOSIS — F32A Depression, unspecified: Secondary | ICD-10-CM | POA: Insufficient documentation

## 2015-03-11 DIAGNOSIS — Z87898 Personal history of other specified conditions: Secondary | ICD-10-CM | POA: Insufficient documentation

## 2015-03-11 DIAGNOSIS — F5101 Primary insomnia: Secondary | ICD-10-CM | POA: Insufficient documentation

## 2015-03-12 ENCOUNTER — Other Ambulatory Visit
Admission: RE | Admit: 2015-03-12 | Discharge: 2015-03-12 | Disposition: A | Discharge status: Home / Self Care | Payer: Medicare PPO | Source: Ambulatory Visit | Attending: Cardiovascular Disease | Admitting: Cardiovascular Disease

## 2015-03-12 ENCOUNTER — Other Ambulatory Visit: Payer: Medicare PPO

## 2015-03-12 DIAGNOSIS — Z79899 Other long term (current) drug therapy: Secondary | ICD-10-CM | POA: Insufficient documentation

## 2015-03-12 LAB — BASIC METABOLIC PANEL
Anion gap: 8 (ref 5–15)
BUN: 12 mg/dL (ref 6–20)
CO2: 28 mmol/L (ref 22–32)
CREATININE: 0.6 mg/dL (ref 0.44–1.00)
Calcium: 9.6 mg/dL (ref 8.9–10.3)
Chloride: 104 mmol/L (ref 101–111)
GFR calc Af Amer: >60 mL/min (ref >60)
Glucose, Bld: 160 mg/dL — ABNORMAL HIGH (ref 65–99)
Potassium: 3.9 mmol/L (ref 3.5–5.1)
Sodium: 140 mmol/L (ref 135–145)

## 2015-03-13 ENCOUNTER — Telehealth: Payer: Self-pay | Admitting: Internal Medicine

## 2015-03-13 NOTE — Telephone Encounter (Signed)
Patient called stating that she believed that her ICD fired while she was in the kitchen. She stated that she felt a sharp pain in her chest lasting only for a second. I instructed patient on how to send a manual transmission. Transmission was reviewed. There have not been any ICD shocks since her 4/20 check in the office. There were no ventricular arrhythmias recorded. There were no "VS" episodes recorded from today and pt's presenting rhythm is currently AS/VP. Pt admitted to feeling the same type of CP sensation in the past. Pt stated that she was going to call her primary cardiologist today for an unrelated matter. I told her to mention the CP to them when she calls. Patient voiced understanding and stated that she would.

## 2015-03-13 NOTE — Telephone Encounter (Signed)
New message ° ° ° ° ° ° °1. Has your device fired?yes ° °2. Is you device beeping? no ° °3. Are you experiencing draining or swelling at device site? no ° °4. Are you calling to see if we received your device transmission? no ° °5. Have you passed out? no °

## 2015-03-18 ENCOUNTER — Encounter: Payer: Self-pay | Admitting: Family

## 2015-03-18 ENCOUNTER — Ambulatory Visit: Payer: Medicare PPO | Attending: Family | Admitting: Family

## 2015-03-18 VITALS — BP 97/54 | HR 70 | Resp 20 | Ht 61.0 in | Wt 115.0 lb

## 2015-03-18 DIAGNOSIS — I5022 Chronic systolic (congestive) heart failure: Secondary | ICD-10-CM

## 2015-03-18 DIAGNOSIS — I509 Heart failure, unspecified: Secondary | ICD-10-CM | POA: Diagnosis present

## 2015-03-18 DIAGNOSIS — E119 Type 2 diabetes mellitus without complications: Secondary | ICD-10-CM | POA: Diagnosis not present

## 2015-03-18 DIAGNOSIS — I428 Other cardiomyopathies: Secondary | ICD-10-CM

## 2015-03-18 DIAGNOSIS — G4700 Insomnia, unspecified: Secondary | ICD-10-CM | POA: Diagnosis not present

## 2015-03-18 NOTE — Progress Notes (Signed)
Subjective:    Patient ID: Bethany [name], female    DOB: [birth year], 69 y.o.   MRN: [id number]  Congestive Heart Failure Presents for follow-up visit. The disease course has been improving. Associated symptoms include chest pain (intermittent), edema (around ankles), fatigue (improving) and shortness of breath. Pertinent negatives include no chest pressure, orthopnea or palpitations. The symptoms have been improving. The pain is at a severity of 2/10. The pain is mild. The pain is present in the lateral region. The quality of the pain is described as sharp. The pain does not radiate. Chest pain occurs with exertion and at rest. Past treatments include beta blockers, salt and fluid restriction and aldosterone receptor blockers. The treatment provided moderate relief. Compliance with prior treatments has been good. Her past medical history is significant for anemia, DM and valvular heart disease.      Review of Systems  Constitutional: Positive for fatigue (improving). Negative for chills.  HENT: Positive for rhinorrhea. Negative for sore throat.   Eyes: Negative.   Respiratory: Positive for cough (intermittent) and shortness of breath. Negative for wheezing.   Cardiovascular: Positive for chest pain (intermittent) and leg swelling (around ankles). Negative for palpitations.  Gastrointestinal: Negative for nausea and abdominal distention.  Endocrine: Negative.   Genitourinary: Negative.   Musculoskeletal: Positive for neck pain. Negative for joint swelling.  Skin: Positive for wound (healed pacemaker site). Negative for rash.  Allergic/Immunologic: Negative.   Neurological: Positive for dizziness (mild) and headaches (mild and intermittent). Negative for syncope.  Hematological: Negative.   Psychiatric/Behavioral: Negative.        Objective:   Physical Exam  Constitutional: She is oriented to person, place, and time. She appears well-developed and well-nourished.  HENT:  Head:  Normocephalic and atraumatic.  Eyes: Conjunctivae are normal. Pupils are equal, round, and reactive to light.  Neck: Normal range of motion. Neck supple.  Cardiovascular: Normal rate and regular rhythm.   Pulmonary/Chest: Effort normal and breath sounds normal.  Abdominal: Soft. There is no tenderness.  Musculoskeletal: Normal range of motion. She exhibits edema (trace amount around bilateral ankles).  Neurological: She is alert and oriented to person, place, and time.  Skin: Skin is warm and dry.  Healed pacemaker/AICD incision over left anterior chest wall.   Psychiatric: She has a normal mood and affect. Her behavior is normal.  Nursing note and vitals reviewed.         Assessment & Plan:  1: Chronic heart failure with reduced ejection fraction- Patient presents with improvement of her shortness of breath and fatigue since having her pacemaker/AICD placed on 02/11/15. She says that she wasn't short of breath nor tired upon walking into the office today. When she does become tired, she says that she recovers quicker than she used to. She continues to weigh daily and reports a steady weight. Reminded her to call for an overnight weight gain of >2 pounds or a weekly weight gain of >5 pounds.  She is now sleeping well since beginning xanax and sleeps on 2 pillows with her head of the bed raised. She is not adding any salt to her food and continues to watch her sodium content. She is currently taking both metoprolol and carvedilol. Both medications were listed in her discharge summary but did explain the usage of both medications and for her to speak with Dr. Graciela Husbands regarding the continued use of both beta blockers.  2: Diabetes- She continues to take metformin and says that her fasting glucose this morning  was 71. 3: Insomnia- She was having a difficult time sleeping after her procedure and saw her PCP regarding this. She has been taking xanax at bedtime and has been sleeping much better since then.    Return in 3 months or sooner for any questions/problems before then.

## 2015-03-18 NOTE — Patient Instructions (Signed)
Continue weighing daily and call for an overnight weight gain of > 2 pounds or a weekly weight gain of >5 pounds. 

## 2015-03-20 ENCOUNTER — Other Ambulatory Visit: Payer: Self-pay

## 2015-03-20 DIAGNOSIS — I10 Essential (primary) hypertension: Secondary | ICD-10-CM | POA: Insufficient documentation

## 2015-03-20 MED ORDER — LOSARTAN POTASSIUM 25 MG PO TABS: 25.0000 mg | ORAL_TABLET | Freq: Every day | ORAL | Status: DC

## 2015-03-31 NOTE — Telephone Encounter (Signed)
error 

## 2015-04-01 ENCOUNTER — Telehealth: Payer: Self-pay | Admitting: Internal Medicine

## 2015-04-01 NOTE — Telephone Encounter (Signed)
Spoke with patient- she states she thinks the device has moved about 1.5 inches downward inside pocket- instructed her to send remote transmission for review. Pt is agreeable

## 2015-04-01 NOTE — Telephone Encounter (Signed)
Transmission reviewed- patient made aware that report shows no abnormality. Denies redness or swelling around device. Patient appreciative.

## 2015-04-01 NOTE — Telephone Encounter (Signed)
New Message       Pt calling stating that her device has moved in her chest to a lower position in her chest and she is concerned. Please call back and advise.

## 2015-04-09 ENCOUNTER — Other Ambulatory Visit: Payer: Self-pay | Admitting: Family Medicine

## 2015-04-14 ENCOUNTER — Other Ambulatory Visit: Payer: Self-pay

## 2015-04-14 DIAGNOSIS — E119 Type 2 diabetes mellitus without complications: Secondary | ICD-10-CM

## 2015-04-14 MED ORDER — GLUCOSE BLOOD VI STRP: ORAL_STRIP | Status: DC

## 2015-04-14 MED ORDER — TRUEPLUS LANCETS 28G MISC: 1.0000 | Freq: Every day | Status: DC

## 2015-04-14 MED ORDER — TRUE METRIX METER W/DEVICE KIT: 1.0000 | PACK | Freq: Every day | Status: DC

## 2015-04-14 MED ORDER — BD SWAB SINGLE USE REGULAR PADS: 1.0000 | MEDICATED_PAD | Freq: Every day | Status: DC

## 2015-05-05 ENCOUNTER — Inpatient Hospital Stay: Payer: Medicare PPO | Attending: Internal Medicine | Admitting: Internal Medicine

## 2015-05-05 ENCOUNTER — Inpatient Hospital Stay: Payer: Medicare PPO

## 2015-05-05 ENCOUNTER — Other Ambulatory Visit: Payer: Self-pay

## 2015-05-05 VITALS — BP 110/69 | HR 72 | Temp 98.0°F | Resp 18 | Ht 61.0 in | Wt 112.4 lb

## 2015-05-05 DIAGNOSIS — I10 Essential (primary) hypertension: Secondary | ICD-10-CM | POA: Insufficient documentation

## 2015-05-05 DIAGNOSIS — D649 Anemia, unspecified: Secondary | ICD-10-CM

## 2015-05-05 DIAGNOSIS — E785 Hyperlipidemia, unspecified: Secondary | ICD-10-CM | POA: Diagnosis not present

## 2015-05-05 DIAGNOSIS — Z9049 Acquired absence of other specified parts of digestive tract: Secondary | ICD-10-CM

## 2015-05-05 DIAGNOSIS — D696 Thrombocytopenia, unspecified: Secondary | ICD-10-CM | POA: Insufficient documentation

## 2015-05-05 DIAGNOSIS — Z79899 Other long term (current) drug therapy: Secondary | ICD-10-CM | POA: Diagnosis not present

## 2015-05-05 DIAGNOSIS — E119 Type 2 diabetes mellitus without complications: Secondary | ICD-10-CM | POA: Insufficient documentation

## 2015-05-05 DIAGNOSIS — E039 Hypothyroidism, unspecified: Secondary | ICD-10-CM | POA: Insufficient documentation

## 2015-05-05 DIAGNOSIS — E876 Hypokalemia: Secondary | ICD-10-CM | POA: Insufficient documentation

## 2015-05-05 DIAGNOSIS — I420 Dilated cardiomyopathy: Secondary | ICD-10-CM | POA: Diagnosis not present

## 2015-05-05 DIAGNOSIS — E079 Disorder of thyroid, unspecified: Secondary | ICD-10-CM | POA: Diagnosis not present

## 2015-05-05 DIAGNOSIS — D72819 Decreased white blood cell count, unspecified: Secondary | ICD-10-CM | POA: Insufficient documentation

## 2015-05-05 DIAGNOSIS — E538 Deficiency of other specified B group vitamins: Secondary | ICD-10-CM | POA: Diagnosis not present

## 2015-05-05 DIAGNOSIS — K219 Gastro-esophageal reflux disease without esophagitis: Secondary | ICD-10-CM | POA: Diagnosis not present

## 2015-05-05 DIAGNOSIS — F419 Anxiety disorder, unspecified: Secondary | ICD-10-CM | POA: Diagnosis not present

## 2015-05-05 DIAGNOSIS — K589 Irritable bowel syndrome without diarrhea: Secondary | ICD-10-CM | POA: Diagnosis not present

## 2015-05-05 DIAGNOSIS — F329 Major depressive disorder, single episode, unspecified: Secondary | ICD-10-CM | POA: Insufficient documentation

## 2015-05-05 LAB — CBC
HCT: 37.8 % (ref 35.0–47.0)
Hemoglobin: 12.1 g/dL (ref 12.0–16.0)
MCH: 29.4 pg (ref 26.0–34.0)
MCHC: 32.1 g/dL (ref 32.0–36.0)
MCV: 91.6 fL (ref 80.0–100.0)
Platelets: 99 10*3/uL — ABNORMAL LOW (ref 150–440)
RBC: 4.13 MIL/uL (ref 3.80–5.20)
RDW: 12.9 % (ref 11.5–14.5)
WBC: 2.5 10*3/uL — ABNORMAL LOW (ref 3.6–11.0)

## 2015-05-05 LAB — IRON AND TIBC
Iron: 103 ug/dL (ref 28–170)
SATURATION RATIOS: 30 % (ref 10.4–31.8)
TIBC: 339 ug/dL (ref 250–450)
UIBC: 236 ug/dL

## 2015-05-05 LAB — FERRITIN: Ferritin: 101 ng/mL (ref 11–307)

## 2015-05-13 ENCOUNTER — Encounter: Payer: Self-pay | Admitting: Family Medicine

## 2015-05-13 ENCOUNTER — Ambulatory Visit (INDEPENDENT_AMBULATORY_CARE_PROVIDER_SITE_OTHER): Payer: Medicare PPO | Admitting: Family Medicine

## 2015-05-13 VITALS — BP 108/58 | HR 68 | Temp 98.7°F | Resp 16 | Ht 61.0 in | Wt 114.0 lb

## 2015-05-13 DIAGNOSIS — E119 Type 2 diabetes mellitus without complications: Secondary | ICD-10-CM

## 2015-05-13 DIAGNOSIS — F5101 Primary insomnia: Secondary | ICD-10-CM | POA: Diagnosis not present

## 2015-05-13 DIAGNOSIS — I5022 Chronic systolic (congestive) heart failure: Secondary | ICD-10-CM | POA: Diagnosis not present

## 2015-05-13 LAB — POCT GLYCOSYLATED HEMOGLOBIN (HGB A1C): HEMOGLOBIN A1C: 6.6

## 2015-05-13 MED ORDER — ALPRAZOLAM 0.5 MG PO TABS
0.5000 mg | ORAL_TABLET | Freq: Every evening | ORAL | Status: DC | PRN
Start: 2015-05-13 — End: 2017-01-10

## 2015-05-13 MED ORDER — ALPRAZOLAM 0.5 MG PO TABS: 0.5000 mg | ORAL_TABLET | Freq: Every evening | ORAL | Status: DC | PRN

## 2015-05-13 NOTE — Progress Notes (Signed)
Subjective:    Patient ID: Bethany [name], female    DOB: Aug 09, 1946, 69 y.o.   MRN: [id number]  HPI Diabetes Mellitus Type II, Follow-up: Patient here for follow-up of Type 2 diabetes mellitus.  Current symptoms/problems include none.   Known diabetic complications: cardiovascular disease Cardiovascular risk factors: diabetes mellitus Current diabetic medications include Metformin 500 mg 1 BID.   Eye exam current (within one year): yes Weight trend: stable Prior visit with dietician: yes - 09/2014 Current diet: in general, a "healthy" diet   Current exercise: walking  Current monitoring regimen: home blood tests - daily Home blood sugar records: fasting range: 89-109 Any episodes of hypoglycemia? no   Insomnia  She presents today for follow up of insomnia. Onset was 2 months ago. Insomnia is getting better. Patient reports she takes Xanax once or twice a week. Patient reports she would like to get a refill on this medication today.  Prior ov 03/09/2015 started on Xanax 0.5 mg 1/2 to 1 tablet  Also noticed a pressure with deep breath.  Not really a pain. Feel something.  Just noticed it right now. Today.  Not  Happened before. Also, having some cough at night, but not every night. Not all the time. Thinks related to medication.   Pain is better now, just happened a few times.    Review of Systems  Constitutional: Negative.   Respiratory: Negative.   Cardiovascular: Positive for chest pain (only with a deep breath.  happened several times, but is better now. ) and leg swelling.  Endocrine: Negative.   Psychiatric/Behavioral: Negative.     Patient Active Problem List   Diagnosis Date Noted  . Benign essential HTN 03/20/2015  . Absolute anemia 03/11/2015  . Abnormal liver enzymes 03/11/2015  . Decreased potassium in the blood 03/11/2015  . Hypercholesteremia 03/11/2015  . Adult hypothyroidism 03/11/2015  . Idiopathic insomnia 03/11/2015  . OP (osteoporosis) 03/11/2015   . Diabetes mellitus, type 2 03/11/2015  . NICM (nonischemic cardiomyopathy) 02/11/2015  . LBBB (left bundle branch block) 02/11/2015  . Paroxysmal supraventricular tachycardia 11/25/2014  . Chronic systolic heart failure 42/68/3419  . Combined fat and carbohydrate induced hyperlipemia 04/10/2014  . Heart valve disease 04/10/2014   Past Medical History  Diagnosis Date  . Diabetes   . Hypertension   . Hyperlipidemia   . Reflux   . Diverticulosis   . Thrombocytopenia   . CHF (congestive heart failure)   . LV dysfunction   . VHD (valvular heart disease)   . Dilated cardiomyopathy   . Thyroid disease   . Depression   . GERD (gastroesophageal reflux disease)   . Anxiety    Current Outpatient Prescriptions on File Prior to Visit  Medication Sig  . acetaminophen (TYLENOL) 500 MG tablet Take 1,000 mg by mouth 2 (two) times daily as needed for mild pain or moderate pain.  . Alcohol Swabs (B-D SINGLE USE SWABS REGULAR) PADS 1 packet by Does not apply route daily. To check blood sugar once a day.  Marland Kitchen aspirin EC 81 MG tablet Take 81 mg by mouth daily.  . Blood Glucose Monitoring Suppl (PRODIGY AUTOCODE BLOOD GLUCOSE) W/DEVICE KIT USE AS DIRECTED  . carvedilol (COREG) 6.25 MG tablet Take 1 tablet by mouth 2 (two) times daily.  . ferrous sulfate 325 (65 FE) MG tablet Take 325 mg by mouth 2 (two) times daily with a meal.  . furosemide (LASIX) 20 MG tablet Take 1 tablet by mouth daily.  Marland Kitchen glucose blood (  TRUE METRIX BLOOD GLUCOSE TEST) test strip To check blood sugars once a day.  Marland Kitchen glucose blood test strip PRODIGY NO CODING BLOOD GLUC (In Vitro Strip)  1 (one) Strip Strip To test blood sugar once a day for 0 days  Quantity: 150;  Refills: 3   Ordered :10-Feb-2015  Ashley Royalty ;  Started 10-Feb-2015 Active  . ketoconazole (NIZORAL) 2 % cream KETOCONAZOLE, 2% (External Cream)  1 (one) Cream daily to affected area as needed for 0 days  Quantity: 30;  Refills: 0   Ordered :09-Mar-2015  Margarita Rana  MD;  Started 09-Mar-2015 Active Comments: Medication taken as needed.  Marland Kitchen levothyroxine (SYNTHROID, LEVOTHROID) 50 MCG tablet Take 1 tablet by mouth daily.  Marland Kitchen losartan (COZAAR) 25 MG tablet Take 1 tablet (25 mg total) by mouth daily.  . metFORMIN (GLUCOPHAGE) 500 MG tablet Take 1 tablet by mouth 2 (two) times daily.  Glory Rosebush DELICA LANCETS 00B MISC   . pantoprazole (PROTONIX) 40 MG tablet Take 1 tablet by mouth daily.  Marland Kitchen senna (SENOKOT) 8.6 MG TABS tablet Take 1 tablet by mouth daily as needed for mild constipation.  . TRUEPLUS LANCETS 28G MISC 1 Device by Does not apply route daily. To check blood sugar once a day.   No current facility-administered medications on file prior to visit.   Allergies  Allergen Reactions  . Other Other (See Comments)    NO BLOOD PRODUCTS- JEHOVAHS WITNESS  . Sulfa Antibiotics Other (See Comments)    Loss of taste   Past Surgical History  Procedure Laterality Date  . Cardiac catheterization    . Cholecystectomy    . Partial hysterectomy    . Bone marrow biopsy    . Bi-ventricular implantable cardioverter defibrillator [name] 02/11/2015    Procedure: BI-VENTRICULAR IMPLANTABLE CARDIOVERTER DEFIBRILLATOR  (CRT-D);  Surgeon: Deboraha Sprang, MD;  Location: [facility] CATH LAB;  Service: Cardiovascular;  Laterality: [name];  . Lead revision [name] 02/11/2015    Procedure: LEAD REVISION;  Surgeon: Deboraha Sprang, MD;  Location: [facility] CATH LAB;  Service: Cardiovascular;  Laterality: [name];  . Insert / replace / remove pacemaker     History   Social History  . Marital Status: Divorced    Spouse Name: [name]  . Number of Children: 4  . Years of Education: [name]   Occupational History  . retired    Social History Main Topics  . Smoking status: Never Smoker   . Smokeless tobacco: Never Used  . Alcohol Use: No  . Drug Use: No  . Sexual Activity: Not on file   Other Topics Concern  . Not on file   Social History Narrative   Family History  Problem Relation Age of Onset  .  Heart disease Mother   . Heart attack Mother   . Prostate cancer Father   . Ulcers Father   . Stroke Sister   . Heart attack Sister   . Diabetes Sister   . Heart attack Brother   . Stroke Brother         Objective:   Physical Exam  Constitutional: She is oriented to person, place, and time. She appears well-developed and well-nourished.  Cardiovascular: Normal rate and regular rhythm.   Pulmonary/Chest: Effort normal and breath sounds normal.  Neurological: She is alert and oriented to person, place, and time.  Skin: Skin is warm.    Blood pressure 108/58, pulse 68, temperature 98.7 F (37.1 C), temperature source Oral, resp. rate 16, height '5\' 1"'  (1.549  m), weight 114 lb (51.71 kg), SpO2 97 %.     Assessment & Plan:   1. Type 2 diabetes mellitus without complication Improved. Continue current medication and plan of care. Recheck in 3 months.  - POCT glycosylated hemoglobin (Hb A1C)  2. Idiopathic insomnia Will continue current medication and plan of care. Uses sparingly.  - ALPRAZolam (XANAX) 0.5 MG tablet; Take 1 tablet (0.5 mg total) by mouth at bedtime as needed for anxiety.  Dispense: 30 tablet; Refill: 0  3. Chronic systolic heart failure Has had some pain with deep breathes, has resolved. Will monitor. Suspect not cardiac related. Will call if recurs. Follow up with cardiology as scheduled.  Margarita Rana, MD

## 2015-05-16 NOTE — Progress Notes (Signed)
[place]  [phone] [phone]) [phone]     ID: Dericka Ostenson Stanzione OB: 26-Nov-1945  MR#: [id number]  ZLD#:[id number]  Patient Care Team: Margarita Rana, MD as PCP - General (Family Medicine) Corey Skains, MD as Consulting Physician (Cardiology) Deboraha Sprang, MD as Consulting Physician (Cardiology)  CHIEF COMPLAINT/DIAGNOSIS:  Leukopenia/Neutropenia along with persistent Thrombocytopenia -   etiology unclear, ? early myelodysplasia versus other etiology. Bone marrow biopsy 04/16/14 - no significant dysplasia or overt neoplasia identified. Normocellular marrow for age cellularity ~40% with erythroid predominant maturing trilineage hematopoiesis, mild nonspecific dyserythropoiesis and adequate megakaryocytes. Diffuse mild reticulin fibrosis (grade 1/3). Decreased iron stores. Flow cytometry unremarkable. Cytogenetics unremarkable, 46XX. SNP microarray diagnosis Normal female arr(1-22,X)x2. Workup done on 04/07/14 remarkable for WBC 2800, 41% neutrophils, 40% lymphs, teardrop cells, Hb 12.7, platelet clumps (Otherwise retic, Direct platelet Ab, iron study, folate, SIEP, random-UIEP, LDH, haptoglobin, PT, PTT, HBsAg, HCV Ab, HIV Ab, peripheral blood Flow Cytometry all unremarkable).  HISTORY OF PRESENT ILLNESS:  Patient returns for hematology followup, she was seen one year ago. States she is doing the same. Previous bone marrow biopsy does not reveal any obvious myelodysplasia but did comment as mild nonspecific dyserythropoiesis and decreased iron stores. No fevers or symptoms of infection. Appetite is good. Denies new bone pains. No skin rash or bleeding symptoms.  REVIEW OF SYSTEMS:   ROS As in HPI above. In addition, no fever, chills or sweats. No new headaches or focal weakness.  No new sore throat, cough, shortness of breath, sputum, hemoptysis or chest pain. No dizziness or palpitation. No abdominal pain, constipation, diarrhea, dysuria or hematuria. No new skin  rash or bleeding symptoms. No new paresthesias in extremities.   PAST MEDICAL HISTORY: Reviewed. Past Medical History  Diagnosis Date  . Diabetes   . Hypertension   . Hyperlipidemia   . Reflux   . Diverticulosis   . Thrombocytopenia   . CHF (congestive heart failure)   . LV dysfunction   . VHD (valvular heart disease)   . Dilated cardiomyopathy   . Thyroid disease   . Depression   . GERD (gastroesophageal reflux disease)   . Anxiety          Type 2 diabetes mellitus  Hyperlipidemia  Hypothyroidism  Depression  Allergic rhinitis  Chronic dyspnea on exertion  Diverticulitis  Vitamin B12 deficiency  Thrombocytopenia  GERD  Hypokalemia  Anemia  Irritable bowel syndrome  Right breast biopsy, denies malignancy  Arthritis  Partial hysterectomy  Cholecystectomy  Cataracts  PAST SURGICAL HISTORY: Reviewed. Past Surgical History  Procedure Laterality Date  . Cardiac catheterization    . Cholecystectomy    . Partial hysterectomy    . Bone marrow biopsy    . Bi-ventricular implantable cardioverter defibrillator [name] 02/11/2015    Procedure: BI-VENTRICULAR IMPLANTABLE CARDIOVERTER DEFIBRILLATOR  (CRT-D);  Surgeon: Deboraha Sprang, MD;  Location: [facility] CATH LAB;  Service: Cardiovascular;  Laterality: [name];  . Lead revision [name] 02/11/2015    Procedure: LEAD REVISION;  Surgeon: Deboraha Sprang, MD;  Location: [facility] CATH LAB;  Service: Cardiovascular;  Laterality: [name];  . Insert / replace / remove pacemaker      FAMILY HISTORY: Reviewed. Family History  Problem Relation Age of Onset  . Heart disease Mother   . Heart attack Mother   . Prostate cancer Father   . Ulcers Father   . Stroke Sister   . Heart attack Sister   . Diabetes Sister   . Heart attack Brother   .  Stroke Brother   Remarkable for diabetes, heart disease, anemia, liver disease, prostate cancer.   SOCIAL HISTORY: Reviewed. History  Substance Use Topics  . Smoking status: Never Smoker   . Smokeless tobacco: Never  Used  . Alcohol Use: No    Allergies  Allergen Reactions  . Other Other (See Comments)    NO BLOOD PRODUCTS- JEHOVAHS WITNESS  . Sulfa Antibiotics Other (See Comments)    Loss of taste    Current Outpatient Prescriptions  Medication Sig Dispense Refill  . acetaminophen (TYLENOL) 500 MG tablet Take 1,000 mg by mouth 2 (two) times daily as needed for mild pain or moderate pain.    . Alcohol Swabs (B-D SINGLE USE SWABS REGULAR) PADS 1 packet by Does not apply route daily. To check blood sugar once a day. 90 each 3  . aspirin EC 81 MG tablet Take 81 mg by mouth daily.    . Blood Glucose Monitoring Suppl (PRODIGY AUTOCODE BLOOD GLUCOSE) W/DEVICE KIT USE AS DIRECTED 1 each 0  . carvedilol (COREG) 6.25 MG tablet Take 1 tablet by mouth 2 (two) times daily.    . ferrous sulfate 325 (65 FE) MG tablet Take 325 mg by mouth 2 (two) times daily with a meal.    . furosemide (LASIX) 20 MG tablet Take 1 tablet by mouth daily.    Marland Kitchen glucose blood (TRUE METRIX BLOOD GLUCOSE TEST) test strip To check blood sugars once a day. 100 each 3  . glucose blood test strip PRODIGY NO CODING BLOOD GLUC (In Vitro Strip)  1 (one) Strip Strip To test blood sugar once a day for 0 days  Quantity: 150;  Refills: 3   Ordered :10-Feb-2015  Ashley Royalty ;  Started 10-Feb-2015 Active    . ketoconazole (NIZORAL) 2 % cream KETOCONAZOLE, 2% (External Cream)  1 (one) Cream daily to affected area as needed for 0 days  Quantity: 30;  Refills: 0   Ordered :09-Mar-2015  Margarita Rana MD;  Started 09-Mar-2015 Active Comments: Medication taken as needed.    Marland Kitchen levothyroxine (SYNTHROID, LEVOTHROID) 50 MCG tablet Take 1 tablet by mouth daily.    Marland Kitchen losartan (COZAAR) 25 MG tablet Take 1 tablet (25 mg total) by mouth daily. 90 tablet 1  . metFORMIN (GLUCOPHAGE) 500 MG tablet Take 1 tablet by mouth 2 (two) times daily.    Glory Rosebush DELICA LANCETS 00X MISC     . pantoprazole (PROTONIX) 40 MG tablet Take 1 tablet by mouth daily.    Marland Kitchen senna (SENOKOT)  8.6 MG TABS tablet Take 1 tablet by mouth daily as needed for mild constipation.    . TRUEPLUS LANCETS 28G MISC 1 Device by Does not apply route daily. To check blood sugar once a day. 100 each 3  . ALPRAZolam (XANAX) 0.5 MG tablet Take 1 tablet (0.5 mg total) by mouth at bedtime as needed for anxiety. 30 tablet 0   No current facility-administered medications for this visit.    PHYSICAL EXAM: Filed Vitals:   05/05/15 1151  BP: 110/69  Pulse: 72  Temp: 98 F (36.7 C)  Resp: 18     Body mass index is 21.26 kg/(m^2).      GENERAL: Patient is alert and oriented and in no acute distress. There is no icterus. HEENT: EOMs intact. Oral exam negative for thrush or lesions. No cervical lymphadenopathy. LUNGS: Bilaterally clear to auscultation, no rhonchi. ABDOMEN: Soft, nontender. No hepatosplenomegaly clinically.  EXTREMITIES: No pedal edema. LYMPHATICS: No palpable adenopathy in axillary  or inguinal areas.   LAB RESULTS:    Component Value Date/Time   NA 140 03/12/2015 1610   NA 140 02/04/2015 1426   NA 142 11/23/2014   K 3.9 03/12/2015 1610   K 4.1 02/04/2015 1426   CL 104 03/12/2015 1610   CL 106 02/04/2015 1426   CO2 28 03/12/2015 1610   CO2 26 02/04/2015 1426   GLUCOSE 160* 03/12/2015 1610   GLUCOSE 179* 02/04/2015 1426   BUN 12 03/12/2015 1610   BUN 11 02/04/2015 1426   BUN 12 11/23/2014   CREATININE 0.60 03/12/2015 1610   CREATININE 0.70 02/04/2015 1426   CREATININE 0.8 11/23/2014   CALCIUM 9.6 03/12/2015 1610   CALCIUM 9.2 02/04/2015 1426   PROT 8.3* 04/02/2013 1015   ALBUMIN 3.8 04/02/2013 1015   AST 21 11/23/2014   AST 17 04/02/2013 1015   ALT 8 11/23/2014   ALT 16 04/02/2013 1015   ALKPHOS 67 04/02/2013 1015   GFRNONAA >60 03/12/2015 1610   GFRNONAA >60 02/04/2015 1426   GFRAA >60 03/12/2015 1610   GFRAA >60 02/04/2015 1426   Lab Results  Component Value Date   WBC 2.5* 05/05/2015   NEUTROABS 1.0* 02/05/2015   HGB 12.1 05/05/2015   HCT 37.8  05/05/2015   MCV 91.6 05/05/2015   PLT 99* 05/05/2015   03/05/14 - WBC 2700, 34% neutrophils, ANC 1900, 51% lymphocytes, ALC 1400, 11% monocytes, AMC 300, 3% eos, 1% basos, platelets 121K, Hb 13.1, MCV 90, Cr 0.69, calcium 9.3, albumin 4.0, LFT unremarkable except ALT of 38.  02/25/14 - WBC 3400, Hb 12.1, platelets 89K. 02/17/14 - WBC 3000, ANC 1100, platelets 138K, Hb 13.4.  02/03/14 - WBC 3600, ANC 1300, platelets 139K, Hb 12.6. May 2014 - WBC 9600, Hb 13.3, platelets 117K  02/26/14 - CT scan abdomen/pelvis done for weight loss, nausea, ? mesenteric ischemia reported liver and spleen unremarkable. 02/21/14 - CT scan chest reported severe cardiomegaly, moderate bilateral pleural effusion, moderate pericardial effusion. July 2014 - Colonoscopy unremarkable.  ASSESSMENT / PLAN:   Leukopenia/Neutropenia along with persistent Thrombocytopenia -   etiology unclear, ? early myelodysplasia versus other etiology.  Bone marrow biopsy 04/16/14 reports no significant dysplasia or overt neoplasia identified. Normocellular marrow for age cellularity ~40% with erythroid predominant maturing trilineage hematopoiesis, mild nonspecific dyserythropoiesis and adequate megakaryocytes. Diffuse mild reticulin fibrosis (grade 1/3). Decreased iron stores. Flow cytometry unremarkable. Cytogenetics unremarkable, 46XX. Workup done on 04/07/14 remarkable for WBC 2800, 41% neutrophils, 40% lymphs, teardrop cells, Hb 12.7, platelet clumps (Otherwise retic, Direct platelet Ab, iron study, folate, SIEP, random-UIEP, LDH, haptoglobin, PT, PTT, HBsAg, HCV Ab, HIV Ab, peripheral blood Flow Cytometry all unremarkable)  Patient reportedly has long-standing thrombocytopenia for many years and a remote history of getting a bone marrow biopsy done more than 15 years ago  -   Have reviewed labs from today and d/w patient. Blood counts are still low but steady and she seems mostly asymptomatic from low blood counts, and plan is continued  surveillance. She was advised to take oral iron ferrous sulfate 325 mg OTC one tablet twice daily and call us if any major constipation or other side effects. Will get CBC q24 weeks. Next MD f/u at 48 weeks with repeat labs.      In between visits, the patient has been advised to call or come to the ER in case of fevers, chills, bleeding, acute sickness, or new symptoms. Patient agreeable to this plan.   Leia Alf, MD   05/16/2015  12:22 PM

## 2015-05-26 ENCOUNTER — Encounter: Payer: Self-pay | Admitting: Internal Medicine

## 2015-05-26 ENCOUNTER — Ambulatory Visit (INDEPENDENT_AMBULATORY_CARE_PROVIDER_SITE_OTHER): Payer: Medicare PPO | Admitting: Internal Medicine

## 2015-05-26 VITALS — BP 122/74 | HR 68 | Ht 61.0 in | Wt 113.4 lb

## 2015-05-26 DIAGNOSIS — Z95810 Presence of automatic (implantable) cardiac defibrillator: Secondary | ICD-10-CM

## 2015-05-26 DIAGNOSIS — I428 Other cardiomyopathies: Secondary | ICD-10-CM

## 2015-05-26 DIAGNOSIS — I471 Supraventricular tachycardia: Secondary | ICD-10-CM

## 2015-05-26 DIAGNOSIS — Z4502 Encounter for adjustment and management of automatic implantable cardiac defibrillator: Secondary | ICD-10-CM | POA: Diagnosis not present

## 2015-05-26 DIAGNOSIS — I5022 Chronic systolic (congestive) heart failure: Secondary | ICD-10-CM

## 2015-05-26 DIAGNOSIS — I429 Cardiomyopathy, unspecified: Secondary | ICD-10-CM

## 2015-05-26 LAB — CUP PACEART INCLINIC DEVICE CHECK
Brady Statistic AP VP Percent: 0.18 %
Brady Statistic AP VS Percent: 0.03 %
Brady Statistic AS VP Percent: 98.43 %
Brady Statistic AS VS Percent: 1.36 %
Brady Statistic RV Percent Paced: 2.2 %
HIGH POWER IMPEDANCE MEASURED VALUE: 228 Ohm
HighPow Impedance: 59 Ohm
Lead Channel Impedance Value: 475 Ohm
Lead Channel Pacing Threshold Amplitude: 0.5 V
Lead Channel Pacing Threshold Amplitude: 1.5 V
Lead Channel Pacing Threshold Pulse Width: 0.4 ms
Lead Channel Pacing Threshold Pulse Width: 0.4 ms
Lead Channel Sensing Intrinsic Amplitude: 19.125 mV
Lead Channel Sensing Intrinsic Amplitude: 2.125 mV
Lead Channel Setting Pacing Amplitude: 1.5 V
Lead Channel Setting Pacing Amplitude: 2.5 V
Lead Channel Setting Sensing Sensitivity: 0.3 mV
MDC IDC MSMT BATTERY REMAINING LONGEVITY: 95 mo
MDC IDC MSMT BATTERY VOLTAGE: 3.05 V
MDC IDC MSMT LEADCHNL LV PACING THRESHOLD PULSEWIDTH: 0.8 ms
MDC IDC MSMT LEADCHNL RV IMPEDANCE VALUE: 513 Ohm
MDC IDC MSMT LEADCHNL RV PACING THRESHOLD AMPLITUDE: 0.75 V
MDC IDC SESS DTM: 20160719174900
MDC IDC SET LEADCHNL LV PACING PULSEWIDTH: 0.8 ms
MDC IDC SET LEADCHNL RV PACING AMPLITUDE: 2 V
MDC IDC SET LEADCHNL RV PACING PULSEWIDTH: 0.4 ms
MDC IDC SET ZONE DETECTION INTERVAL: 350 ms
MDC IDC STAT BRADY RA PERCENT PACED: 0.21 %
Zone Setting Detection Interval: 300 ms
Zone Setting Detection Interval: 340 ms
Zone Setting Detection Interval: 360 ms

## 2015-05-26 NOTE — Patient Instructions (Addendum)
Medication Instructions:  Your physician recommends that you continue on your current medications as directed. Please refer to the Current Medication list given to you today.  Labwork: None ordered  Testing/Procedures: None ordered  Follow-Up: Remote monitoring is used to monitor your Pacemaker of ICD from home. This monitoring reduces the number of office visits required to check your device to one time per year. It allows [place] to keep an eye on the functioning of your device to ensure it is working properly. You are scheduled for a device check from home on 08/25/15. You may send your transmission at any time that day. If you have a wireless device, the transmission will be sent automatically. After your physician reviews your transmission, you will receive a postcard with your next transmission date.  Your physician wants you to follow-up in: 9 months with Dr. Graciela Husbands in [place]. You will receive a reminder letter in the mail two months in advance. If you don't receive a letter, please call our office to schedule the follow-up appointment.   Any Other Special Instructions Will Be Listed Below (If Applicable). Thank you for choosing [facility]!!

## 2015-05-26 NOTE — Progress Notes (Signed)
ELECTROPHYSIOLOGY Clinic  NOTE  Patient ID: Bethany [name], MRN: [id number], DOB/AGE: 12-15-1945 69 y.o. Admit date: (Not on file) Date of Consult: 05/26/2015  Primary Physician: Margarita Rana, MD Primary Cardiologist: BK   Chief Complaint: Defibrillator   HPI Bethany [name] is a 70 y.o. female   in follow-up for CRT-D implantation 4/16.  She has a history of a nonischemic cardiomyopathy; she is undergoing catheterization 2012 demonstrating normal coronary arteries; echocardiogram at that point demonstrated ejection fraction of 20%. (This is all obtained from reviewing records from [facility].) She also underwent echocardiogram 3/16 by Dr. Helane Gunther at [facility]. Ejection fraction was 15-20%.   She has chronic systolic heart failure;   She's been able to tolerate guideline directed medical therapy following CRT implantation on carvedilol and losartan. Metabolic profile was obtained 5/16 was normal.   She has no nocturnal dyspnea or orthopnea. There is no peripheral edema. She has some dyspnea on exertion.    She's had some palpitations but no syncope or presyncope.  Past Medical History  Diagnosis Date  . Diabetes   . Hypertension   . Hyperlipidemia   . Reflux   . Diverticulosis   . Thrombocytopenia   . CHF (congestive heart failure)   . LV dysfunction   . VHD (valvular heart disease)   . Dilated cardiomyopathy   . Thyroid disease   . Depression   . GERD (gastroesophageal reflux disease)   . Anxiety       Surgical History:  Past Surgical History  Procedure Laterality Date  . Cardiac catheterization    . Cholecystectomy    . Partial hysterectomy    . Bone marrow biopsy    . Bi-ventricular implantable cardioverter defibrillator [name] 02/11/2015    Procedure: BI-VENTRICULAR IMPLANTABLE CARDIOVERTER DEFIBRILLATOR  (CRT-D);  Surgeon: Deboraha Sprang, MD;  Location: [facility] CATH LAB;  Service: Cardiovascular;  Laterality: [name];  . Lead revision [name] 02/11/2015    Procedure: LEAD REVISION;  Surgeon:  Deboraha Sprang, MD;  Location: [facility] CATH LAB;  Service: Cardiovascular;  Laterality: [name];  . Insert / replace / remove pacemaker       Home Meds: Prior to Admission medications   Medication Sig Start Date End Date Taking? Authorizing Provider  escitalopram (LEXAPRO) 10 MG tablet Take 1 tablet by mouth daily. 01/02/15  Yes Historical Provider, MD  furosemide (LASIX) 20 MG tablet Take 1 tablet by mouth daily. 09/22/14 09/22/15 Yes Historical Provider, MD  IRON PO Take 1 tablet by mouth daily.   Yes Historical Provider, MD  levothyroxine (SYNTHROID, LEVOTHROID) 50 MCG tablet Take 1 tablet by mouth daily.   Yes Historical Provider, MD  metFORMIN (GLUCOPHAGE) 500 MG tablet Take 1 tablet by mouth 2 (two) times daily. 06/12/14  Yes Historical Provider, MD  metoprolol tartrate (LOPRESSOR) 25 MG tablet Take 12.5 mg by mouth 2 (two) times daily.   Yes Historical Provider, MD  pantoprazole (PROTONIX) 40 MG tablet Take 1 tablet by mouth daily.   Yes Historical Provider, MD  spironolactone (ALDACTONE) 25 MG tablet Take 1 tablet by mouth 3 (three) times a week.   Yes Historical Provider, MD      Allergies:  Allergies  Allergen Reactions  . Other Other (See Comments)    NO BLOOD PRODUCTS- JEHOVAHS WITNESS  . Sulfa Antibiotics Other (See Comments)    Loss of taste    History   Social History  . Marital Status: Divorced    Spouse Name: [name]  . Number of Children:  4  . Years of Education: [name]   Occupational History  . retired    Social History Main Topics  . Smoking status: Never Smoker   . Smokeless tobacco: Never Used  . Alcohol Use: No  . Drug Use: No  . Sexual Activity: Not on file   Other Topics Concern  . Not on file   Social History Narrative     Family History  Problem Relation Age of Onset  . Heart disease Mother   . Heart attack Mother   . Prostate cancer Father   . Ulcers Father   . Stroke Sister   . Heart attack Sister   . Diabetes Sister   . Heart attack Brother   .  Stroke Brother      ROS:  Please see the history of present illness.     All other systems reviewed and negative.    Physical Exam:   Height _0  (1.549 m), weight 113 lb 6.4 oz (51.438 kg). General: Well developed, well nourished female in no acute distress. Head: Normocephalic, atraumatic, sclera non-icteric, no xanthomas, nares are without discharge. EENT: normal Lymph Nodes:  none Back: without scoliosis/kyphosis , no CVA tendersness Neck: Negative for carotid bruits. JVD not elevated. Device pocket well healed; without hematoma or erythema.  There is no tethering  Lungs: Clear bilaterally to auscultation without wheezes, rales, or rhonchi. Breathing is unlabored. Heart: RRR with S1 S2.   Earl;y 2/6 systolic   murmur , rubs, or gallops appreciated. Abdomen: Soft, non-tender, non-distended with normoactive bowel sounds. No hepatomegaly. No rebound/guarding. No obvious abdominal masses. Msk:  Strength and tone appear normal for age. Extremities: No clubbing or cyanosis. No  edema.  Distal pedal pulses are 2+ and equal bilaterally. Skin: Warm and Dry Neuro: Alert and oriented X 3. CN III-XII intact Grossly normal sensory and motor function . Psych:  Responds to questions appropriately with a normal affect.      Labs: Cardiac Enzymes No results for input(s): CKTOTAL, CKMB, TROPONINI in the last 72 hours. CBC   Miscellaneous No results found for: DDIMER  Radiology/Studies:  No results found.  EKG:  NSR    P synchronous pacing   intervals 13/13/46 Axis left -83    Assessment and Plan:   NICM  CHF class 2B3A  LBBB  Hypotension  CRT D implanted 4/16   she is much improved with CRT.   Continue her current medications. Interval  Metabolic profile was obtained and demonstrated stable renal function.   We will see her again in 9 months   Virl Axe

## 2015-05-27 ENCOUNTER — Other Ambulatory Visit: Payer: Self-pay | Admitting: Family Medicine

## 2015-05-27 DIAGNOSIS — E119 Type 2 diabetes mellitus without complications: Secondary | ICD-10-CM

## 2015-05-27 DIAGNOSIS — E039 Hypothyroidism, unspecified: Secondary | ICD-10-CM

## 2015-06-18 ENCOUNTER — Ambulatory Visit: Payer: Medicare PPO | Attending: Family | Admitting: Family

## 2015-06-18 ENCOUNTER — Encounter: Payer: Self-pay | Admitting: Family

## 2015-06-18 VITALS — BP 98/40 | HR 72 | Resp 20 | Ht 61.0 in | Wt 114.0 lb

## 2015-06-18 DIAGNOSIS — I10 Essential (primary) hypertension: Secondary | ICD-10-CM | POA: Diagnosis not present

## 2015-06-18 DIAGNOSIS — I959 Hypotension, unspecified: Secondary | ICD-10-CM | POA: Diagnosis not present

## 2015-06-18 DIAGNOSIS — I950 Idiopathic hypotension: Secondary | ICD-10-CM

## 2015-06-18 DIAGNOSIS — F419 Anxiety disorder, unspecified: Secondary | ICD-10-CM | POA: Diagnosis not present

## 2015-06-18 DIAGNOSIS — F329 Major depressive disorder, single episode, unspecified: Secondary | ICD-10-CM | POA: Diagnosis not present

## 2015-06-18 DIAGNOSIS — Z79899 Other long term (current) drug therapy: Secondary | ICD-10-CM | POA: Diagnosis not present

## 2015-06-18 DIAGNOSIS — R42 Dizziness and giddiness: Secondary | ICD-10-CM | POA: Diagnosis not present

## 2015-06-18 DIAGNOSIS — E079 Disorder of thyroid, unspecified: Secondary | ICD-10-CM | POA: Insufficient documentation

## 2015-06-18 DIAGNOSIS — R079 Chest pain, unspecified: Secondary | ICD-10-CM | POA: Insufficient documentation

## 2015-06-18 DIAGNOSIS — I5022 Chronic systolic (congestive) heart failure: Secondary | ICD-10-CM | POA: Diagnosis present

## 2015-06-18 DIAGNOSIS — K219 Gastro-esophageal reflux disease without esophagitis: Secondary | ICD-10-CM | POA: Insufficient documentation

## 2015-06-18 DIAGNOSIS — I420 Dilated cardiomyopathy: Secondary | ICD-10-CM | POA: Diagnosis not present

## 2015-06-18 DIAGNOSIS — Z7982 Long term (current) use of aspirin: Secondary | ICD-10-CM | POA: Insufficient documentation

## 2015-06-18 DIAGNOSIS — E119 Type 2 diabetes mellitus without complications: Secondary | ICD-10-CM

## 2015-06-18 DIAGNOSIS — E785 Hyperlipidemia, unspecified: Secondary | ICD-10-CM | POA: Diagnosis not present

## 2015-06-18 DIAGNOSIS — D696 Thrombocytopenia, unspecified: Secondary | ICD-10-CM | POA: Diagnosis not present

## 2015-06-18 NOTE — Patient Instructions (Signed)
Continue weighing daily and call for an overnight weight gain of > 2 pounds or a weekly weight gain of >5 pounds. 

## 2015-06-18 NOTE — Progress Notes (Signed)
Subjective:    Patient ID: Bethany [name], female    DOB: 1946/01/05, 69 y.o.   MRN: [id number]  Congestive Heart Failure Presents for follow-up visit. The disease [name] has been stable. Associated symptoms include abdominal pain, chest pain, edema, fatigue, palpitations and shortness of breath. Pertinent negatives include no orthopnea. The symptoms have been stable. Past treatments include angiotensin receptor blockers, salt and fluid restriction and beta blockers. The treatment provided moderate relief. Compliance with prior treatments has been good. Her past medical history is significant for DM and HTN. Compliance with total regimen is 76-100%.  Chest Pain  This is a chronic problem. The current episode started more than 1 year ago. The onset quality is gradual. The problem occurs intermittently. The problem has been unchanged. The pain is present in the epigastric region. The pain is at a severity of 5/10. The pain is mild. The quality of the pain is described as sharp. The pain does not radiate. Associated symptoms include abdominal pain, exertional chest pressure, headaches (lasted about a week), lower extremity edema, palpitations and shortness of breath. Pertinent negatives include no back pain, cough, diaphoresis, dizziness, irregular heartbeat, nausea, numbness or vomiting. The pain is aggravated by exertion and lifting. She has tried rest for the symptoms. The treatment provided significant relief. Risk factors include post-menopausal.  Her past medical history is significant for CHF, diabetes, DM, hyperlipidemia, HTN, hypertension and valve disorder.  Her family medical history is significant for diabetes, heart disease and stroke.    Past Medical History  Diagnosis Date  . Diabetes   . Hypertension   . Hyperlipidemia   . Reflux   . Diverticulosis   . Thrombocytopenia   . CHF (congestive heart failure)   . LV dysfunction   . VHD (valvular heart disease)   . Dilated  cardiomyopathy   . Thyroid disease   . Depression   . GERD (gastroesophageal reflux disease)   . Anxiety    Past Surgical History  Procedure Laterality Date  . Cardiac catheterization    . Cholecystectomy    . Partial hysterectomy    . Bone marrow biopsy    . Bi-ventricular implantable cardioverter defibrillator [name] 02/11/2015    Procedure: BI-VENTRICULAR IMPLANTABLE CARDIOVERTER DEFIBRILLATOR  (CRT-D);  Surgeon: Deboraha Sprang, MD;  Location: [facility] CATH LAB;  Service: Cardiovascular;  Laterality: [name];  . Lead revision [name] 02/11/2015    Procedure: LEAD REVISION;  Surgeon: Deboraha Sprang, MD;  Location: [facility] CATH LAB;  Service: Cardiovascular;  Laterality: [name];  . Insert / replace / remove pacemaker      Family History  Problem Relation Age of Onset  . Heart disease Mother   . Heart attack Mother   . Prostate cancer Father   . Ulcers Father   . Stroke Sister   . Heart attack Sister   . Diabetes Sister   . Heart attack Brother   . Stroke Brother     Social History  Substance Use Topics  . Smoking status: Never Smoker   . Smokeless tobacco: Never Used  . Alcohol Use: No    Allergies  Allergen Reactions  . Other Other (See Comments)    NO BLOOD PRODUCTS- JEHOVAHS WITNESS  . Sulfa Antibiotics Other (See Comments)    Loss of taste    Prior to Admission medications   Medication Sig Start Date End Date Taking? Authorizing Provider  acetaminophen (TYLENOL) 500 MG tablet Take 1,000 mg by mouth 2 (two) times daily as needed for  mild pain or moderate pain.   Yes Historical Provider, MD  Alcohol Swabs (B-D SINGLE USE SWABS REGULAR) PADS 1 packet by Does not apply route daily. To check blood sugar once a day. 04/14/15  Yes Margarita Rana, MD  ALPRAZolam Duanne Moron) 0.5 MG tablet Take 1 tablet (0.5 mg total) by mouth at bedtime as needed for anxiety. 05/13/15  Yes Margarita Rana, MD  aspirin EC 81 MG tablet Take 81 mg by mouth daily.   Yes Historical Provider, MD  Blood Glucose Monitoring Suppl  (PRODIGY AUTOCODE BLOOD GLUCOSE) W/DEVICE KIT USE AS DIRECTED 04/09/15  Yes Margarita Rana, MD  carvedilol (COREG) 6.25 MG tablet Take 1 tablet by mouth 2 (two) times daily. 03/26/15  Yes Historical Provider, MD  ferrous sulfate 325 (65 FE) MG tablet Take 325 mg by mouth 2 (two) times daily with a meal.   Yes Historical Provider, MD  furosemide (LASIX) 20 MG tablet Take 1 tablet by mouth daily. 09/22/14 09/22/15 Yes Historical Provider, MD  glucose blood (TRUE METRIX BLOOD GLUCOSE TEST) test strip To check blood sugars once a day. 04/14/15  Yes Margarita Rana, MD  glucose blood test strip PRODIGY NO CODING BLOOD GLUC (In Vitro Strip)  1 (one) Strip Strip To test blood sugar once a day for 0 days  Quantity: 150;  Refills: 3   Ordered :10-Feb-2015  Ashley Royalty ;  Started 10-Feb-2015 Active 02/10/15  Yes Historical Provider, MD  losartan (COZAAR) 25 MG tablet Take 1 tablet (25 mg total) by mouth daily. 03/20/15  Yes Deboraha Sprang, MD  metFORMIN (GLUCOPHAGE) 500 MG tablet TAKE 1 TABLET TWICE DAILY 05/27/15  Yes Margarita Rana, MD  [facility] DELICA LANCETS [address] [place]  01/20/15  Yes Historical Provider, MD  pantoprazole (PROTONIX) 40 MG tablet Take 1 tablet by mouth daily.   Yes Historical Provider, MD  senna (SENOKOT) 8.6 MG TABS tablet Take 1 tablet by mouth daily as needed for mild constipation.   Yes Historical Provider, MD  SYNTHROID 50 MCG tablet TAKE 1 TABLET EVERY DAY 05/27/15  Yes Margarita Rana, MD  TRUEPLUS LANCETS 28G MISC 1 Device by Does not apply route daily. To check blood sugar once a day. 04/14/15  Yes Margarita Rana, MD     Review of Systems  Constitutional: Positive for appetite change (not much of an appetite) and fatigue. Negative for diaphoresis.  HENT: Positive for rhinorrhea and sneezing. Negative for congestion and sore throat.   Eyes: Negative.   Respiratory: Positive for shortness of breath. Negative for cough and chest tightness.   Cardiovascular: Positive for chest pain, palpitations and leg  swelling.  Gastrointestinal: Positive for abdominal pain and diarrhea. Negative for nausea, vomiting and abdominal distention.  Endocrine: Negative.   Genitourinary: Negative.   Musculoskeletal: Positive for neck pain (sometimes). Negative for back pain.  Skin: Negative.   Allergic/Immunologic: Negative.   Neurological: Positive for light-headedness and headaches (lasted about a week). Negative for dizziness and numbness.  Hematological: Negative for adenopathy. Bruises/bleeds easily.  Psychiatric/Behavioral: Negative for sleep disturbance (sleeping on 2 pillows along with head of bed elevated). The patient is not nervous/anxious.        Objective:   Physical Exam  Constitutional: She is oriented to person, place, and time. She appears well-developed and well-nourished.  HENT:  Head: Normocephalic and atraumatic.  Eyes: Conjunctivae are normal. Pupils are equal, round, and reactive to light.  Neck: Normal range of motion. Neck supple.  Cardiovascular: Normal rate and regular rhythm.   Pulmonary/Chest: Effort  normal. She has no wheezes. She has no rales.  Abdominal: Soft. She exhibits no distension. There is no tenderness.  Musculoskeletal: She exhibits edema (trace amount around left ankle). She exhibits no tenderness.  Neurological: She is alert and oriented to person, place, and time.  Skin: Skin is warm and dry. Bruising (bilateral shins) noted.  Psychiatric: She has a normal mood and affect. Her behavior is normal. Thought content normal.  Nursing note and vitals reviewed.   BP 98/40 mmHg  Pulse 72  Resp 20  Ht _0  (1.549 m)  Wt 114 lb (51.71 kg)  BMI 21.55 kg/m2  SpO2 100%  LMP  (LMP Unknown)       Assessment & Plan:  1: Chronic heart failure with reduced ejection fraction- Patient presents with continued fatigue upon exertion along with some shortness of breath. When she does experience symptoms, she will stop to rest until her symptoms improve. She continues to  weigh herself daily and her weight chart shows a stable weight. Reminded to call for an overnight weight gain of >2 pounds or a weekly weight gain of >5 pounds. She is not adding any salt to her food and tries to follow a low sodium diet.  2: Hypotension- Blood pressure remains on the low side and she does experience some light-headedness but denies any falls. Encouraged her to change positions slowly.  3: Diabetes- She says that her fasting glucose this morning was 94 and it tends to run upper 90's to low 100's.  4: Chest pain- This is intermittent in nature and tends to occur with exertion. She had some pain a few days ago but it occurred when she was moving boxes into her new apartment. Resolved with rest after a few minutes. Follows closely with cardiology regarding this.   Return in 3 months or sooner for any questions/problems before then.

## 2015-07-09 ENCOUNTER — Telehealth: Payer: Self-pay

## 2015-07-09 NOTE — Telephone Encounter (Signed)
Bethany [name] called into the office this morning to report that her weight had increased from 109.8 lbs yesterday morning to 113.2 lbs today. (3.4lb increase) She describes mild edema in her ankles, denies shortness of breath or chest pain. Pt advised to increase furosemide today and call back to report weight tomorrow. Patient was also advised to call back if she experiences any shortness of breath, chest pain or edema increases. Pt verbalized understanding.

## 2015-07-29 DIAGNOSIS — Z95810 Presence of automatic (implantable) cardiac defibrillator: Secondary | ICD-10-CM | POA: Insufficient documentation

## 2015-08-13 ENCOUNTER — Ambulatory Visit: Payer: Medicare PPO | Admitting: Family Medicine

## 2015-08-14 ENCOUNTER — Encounter: Payer: Self-pay | Admitting: Family Medicine

## 2015-08-14 ENCOUNTER — Ambulatory Visit (INDEPENDENT_AMBULATORY_CARE_PROVIDER_SITE_OTHER): Payer: Medicare PPO | Admitting: Family Medicine

## 2015-08-14 VITALS — BP 112/60 | HR 80 | Temp 97.8°F | Resp 16 | Ht 61.0 in | Wt 118.0 lb

## 2015-08-14 DIAGNOSIS — Z23 Encounter for immunization: Secondary | ICD-10-CM

## 2015-08-14 DIAGNOSIS — E119 Type 2 diabetes mellitus without complications: Secondary | ICD-10-CM

## 2015-08-14 LAB — POCT UA - MICROALBUMIN: Microalbumin Ur, POC: NEGATIVE mg/L

## 2015-08-14 LAB — POCT GLYCOSYLATED HEMOGLOBIN (HGB A1C)
ESTIMATED AVERAGE GLUCOSE: 140
Hemoglobin A1C: 6.5

## 2015-08-14 NOTE — Progress Notes (Signed)
Subjective:    Patient ID: Bethany [name], female    DOB: 05-17-1946, 69 y.o.   MRN: [id number]  Diabetes She presents for her follow-up diabetic visit. She has type 2 diabetes mellitus. Hypoglycemia symptoms include headaches and sweats. Pertinent negatives for hypoglycemia include no confusion. Associated symptoms include blurred vision, fatigue, foot paresthesias, polyphagia, polyuria, visual change and weakness. Pertinent negatives for diabetes include no chest pain, no foot ulcerations, no polydipsia and no weight loss. There are no known risk factors for coronary artery disease. Current diabetic treatment includes oral agent (monotherapy) (Metformin 500 mg BID). She is compliant with treatment all of the time. Exercise: Nothing formal, but busy.  (FBS 88-106) Eye exam current: Has appointment on 10/27 with [facility].  Hypertension This is a chronic problem. The problem is controlled. Associated symptoms include blurred vision, headaches, malaise/fatigue, orthopnea, palpitations (pt has pace maker), peripheral edema, shortness of breath (defibrillator in place) and sweats. Pertinent negatives include no anxiety, chest pain or neck pain. Treatments tried: Cozaar 25 mg, Coreg 6.25 mg BID. There are no compliance problems.   Hypothyroidism Last TSH was checked 12/03/2014 and was 2.700. Currently taking Levothyroxine 50 mcg.  No current symptoms.  Has been having some increased sweats, but not sure if related to not taking her medication on schedule.   Needs flu shot today.     Review of Systems  Constitutional: Positive for malaise/fatigue, diaphoresis and fatigue. Negative for weight loss.       Sweats but thinks maybe related to her move. Has not been taking her medication on time.   Eyes: Positive for blurred vision.  Respiratory: Positive for shortness of breath (defibrillator in place).        Unchanged.   Cardiovascular: Positive for palpitations (pt has pace maker) and orthopnea.  Negative for chest pain.       Has seen cardiologist.   Endocrine: Positive for polyphagia and polyuria. Negative for polydipsia.  Musculoskeletal: Negative for neck pain.  Neurological: Positive for weakness and headaches.  Psychiatric/Behavioral: Negative for confusion.   BP 112/60 mmHg  Pulse 80  Temp(Src) 97.8 F (36.6 C) (Oral)  Resp 16  Ht 5' 1" (1.549 m)  Wt 118 lb (53.524 kg)  BMI 22.31 kg/m2  LMP  (LMP Unknown)   Patient Active Problem List   Diagnosis Date Noted  . Automatic implantable cardioverter-defibrillator in situ 07/29/2015  . Hypotension 06/18/2015  . Chest pain on exertion 06/18/2015  . Benign essential HTN 03/20/2015  . Absolute anemia 03/11/2015  . Abnormal liver enzymes 03/11/2015  . Decreased potassium in the blood 03/11/2015  . Hypercholesteremia 03/11/2015  . Adult hypothyroidism 03/11/2015  . Idiopathic insomnia 03/11/2015  . OP (osteoporosis) 03/11/2015  . Diabetes mellitus, type 2 ([place]) 03/11/2015  . NICM (nonischemic cardiomyopathy) ([place]) 02/11/2015  . LBBB (left bundle branch block) 02/11/2015  . Paroxysmal supraventricular tachycardia ([place]) 11/25/2014  . Chronic systolic heart failure ([place]) 09/25/2014  . Heart valve disease 04/10/2014   Past Medical History  Diagnosis Date  . Diabetes ([place])   . Hypertension   . Hyperlipidemia   . Reflux   . Diverticulosis   . Thrombocytopenia ([place])   . CHF (congestive heart failure) ([place])   . LV dysfunction   . VHD (valvular heart disease)   . Dilated cardiomyopathy ([place])   . Thyroid disease   . Depression   . GERD (gastroesophageal reflux disease)   . Anxiety    Current Outpatient Prescriptions on File Prior to Visit  Medication Sig  . acetaminophen (TYLENOL) 500 MG tablet Take 1,000 mg by mouth 2 (two) times daily as needed for mild pain or moderate pain.  . Alcohol Swabs (B-D SINGLE USE SWABS REGULAR) PADS 1 packet by Does not apply route daily. To check blood sugar once a day.  . ALPRAZolam  (XANAX) 0.5 MG tablet Take 1 tablet (0.5 mg total) by mouth at bedtime as needed for anxiety.  . aspirin EC 81 MG tablet Take 81 mg by mouth daily.  . Blood Glucose Monitoring Suppl (PRODIGY AUTOCODE BLOOD GLUCOSE) W/DEVICE KIT USE AS DIRECTED  . carvedilol (COREG) 6.25 MG tablet Take 1 tablet by mouth 2 (two) times daily.  . ferrous sulfate 325 (65 FE) MG tablet Take 325 mg by mouth 2 (two) times daily with a meal.  . furosemide (LASIX) 20 MG tablet Take 1 tablet by mouth daily.  . glucose blood (TRUE METRIX BLOOD GLUCOSE TEST) test strip To check blood sugars once a day.  . glucose blood test strip PRODIGY NO CODING BLOOD GLUC (In Vitro Strip)  1 (one) Strip Strip To test blood sugar once a day for 0 days  Quantity: 150;  Refills: 3   Ordered :10-Feb-2015  Walsh, Laura ;  Started 10-Feb-2015 Active  . losartan (COZAAR) 25 MG tablet Take 1 tablet (25 mg total) by mouth daily.  . metFORMIN (GLUCOPHAGE) 500 MG tablet TAKE 1 TABLET TWICE DAILY  . ONETOUCH DELICA LANCETS 33G MISC   . pantoprazole (PROTONIX) 40 MG tablet Take 1 tablet by mouth daily.  . senna (SENOKOT) 8.6 MG TABS tablet Take 1 tablet by mouth daily as needed for mild constipation.  . SYNTHROID 50 MCG tablet TAKE 1 TABLET EVERY DAY  . TRUEPLUS LANCETS 28G MISC 1 Device by Does not apply route daily. To check blood sugar once a day.   No current facility-administered medications on file prior to visit.   Allergies  Allergen Reactions  . Other Other (See Comments)    NO BLOOD PRODUCTS- JEHOVAHS WITNESS  . Sulfa Antibiotics Other (See Comments)    Loss of taste   Past Surgical History  Procedure Laterality Date  . Cardiac catheterization    . Cholecystectomy    . Partial hysterectomy    . Bone marrow biopsy    . Bi-ventricular implantable cardioverter defibrillator [name] 02/11/2015    Procedure: BI-VENTRICULAR IMPLANTABLE CARDIOVERTER DEFIBRILLATOR  (CRT-D);  Surgeon: Steven C Klein, MD;  Location: [facility] CATH LAB;  Service:  Cardiovascular;  Laterality: [name];  . Lead revision [name] 02/11/2015    Procedure: LEAD REVISION;  Surgeon: Steven C Klein, MD;  Location: [facility] CATH LAB;  Service: Cardiovascular;  Laterality: [name];  . Insert / replace / remove pacemaker     Social History   Social History  . Marital Status: Divorced    Spouse Name: [name]  . Number of Children: 4  . Years of Education: [name]   Occupational History  . retired    Social History Main Topics  . Smoking status: Never Smoker   . Smokeless tobacco: Never Used  . Alcohol Use: No  . Drug Use: No  . Sexual Activity: Not on file   Other Topics Concern  . Not on file   Social History Narrative   Family History  Problem Relation Age of Onset  . Heart disease Mother   . Heart attack Mother   . Prostate cancer Father   . Ulcers Father   . Stroke Sister   . Heart attack   Sister   . Diabetes Sister   . Heart attack Brother   . Stroke Brother       Objective:   Physical Exam  Constitutional: She is oriented to person, place, and time. She appears well-developed and well-nourished.  Cardiovascular: Normal rate and regular rhythm.   Pulmonary/Chest: Effort normal and breath sounds normal.  Neurological: She is alert and oriented to person, place, and time.  Psychiatric: She has a normal mood and affect. Her behavior is normal. Judgment and thought content normal.   BP 112/60 mmHg  Pulse 80  Temp(Src) 97.8 F (36.6 C) (Oral)  Resp 16  Ht 5' 1" (1.549 m)  Wt 118 lb (53.524 kg)  BMI 22.31 kg/m2  LMP  (LMP Unknown)      Assessment & Plan:  1. Type 2 diabetes mellitus without complication, without long-term current use of insulin (HCC) Stable. Continue current plan of care.  Will recheck in 3 months.  - POCT glycosylated hemoglobin (Hb A1C) - POCT UA - Microalbumin Results for orders placed or performed in visit on 08/14/15  POCT glycosylated hemoglobin (Hb A1C)  Result Value Ref Range   Hemoglobin A1C 6.5    Est. average glucose Bld  gHb Est-mCnc 140   POCT UA - Microalbumin  Result Value Ref Range   Microalbumin Ur, POC Negative mg/L    2. Encounter for immunization Flu vaccine given today. Will get Pneumonia next month or two.   Margarita Rana, MD

## 2015-08-21 ENCOUNTER — Other Ambulatory Visit: Payer: Self-pay | Admitting: Family Medicine

## 2015-08-21 DIAGNOSIS — K219 Gastro-esophageal reflux disease without esophagitis: Secondary | ICD-10-CM

## 2015-08-24 DIAGNOSIS — K219 Gastro-esophageal reflux disease without esophagitis: Secondary | ICD-10-CM | POA: Insufficient documentation

## 2015-08-25 ENCOUNTER — Ambulatory Visit (INDEPENDENT_AMBULATORY_CARE_PROVIDER_SITE_OTHER): Payer: Medicare PPO | Admitting: *Deleted

## 2015-08-25 ENCOUNTER — Encounter: Payer: Self-pay | Admitting: Cardiology

## 2015-08-25 DIAGNOSIS — I429 Cardiomyopathy, unspecified: Secondary | ICD-10-CM | POA: Diagnosis not present

## 2015-08-25 DIAGNOSIS — I5022 Chronic systolic (congestive) heart failure: Secondary | ICD-10-CM | POA: Diagnosis not present

## 2015-08-25 DIAGNOSIS — I428 Other cardiomyopathies: Secondary | ICD-10-CM

## 2015-08-25 LAB — CUP PACEART REMOTE DEVICE CHECK
Battery Remaining Longevity: 74 mo
Battery Voltage: 3.01 V
Brady Statistic AP VP Percent: 0 %
Brady Statistic AP VS Percent: 0 %
Brady Statistic AS VS Percent: 1.01 %
HIGH POWER IMPEDANCE MEASURED VALUE: 48 Ohm
Implantable Lead Implant Date: 20160409
Implantable Lead Location: 753858
Implantable Lead Location: 753859
Implantable Lead Location: 753860
Implantable Lead Model: 4598
Implantable Lead Model: 5076
Lead Channel Impedance Value: 361 Ohm
Lead Channel Impedance Value: 399 Ohm
Lead Channel Impedance Value: 456 Ohm
Lead Channel Impedance Value: 513 Ohm
Lead Channel Impedance Value: 513 Ohm
Lead Channel Impedance Value: 589 Ohm
Lead Channel Impedance Value: 722 Ohm
Lead Channel Impedance Value: 760 Ohm
Lead Channel Impedance Value: 817 Ohm
Lead Channel Impedance Value: 893 Ohm
Lead Channel Impedance Value: 950 Ohm
Lead Channel Pacing Threshold Amplitude: 0.875 V
Lead Channel Pacing Threshold Amplitude: 1.75 V
Lead Channel Pacing Threshold Pulse Width: 0.4 ms
Lead Channel Pacing Threshold Pulse Width: 0.4 ms
Lead Channel Sensing Intrinsic Amplitude: 1.5 mV
Lead Channel Sensing Intrinsic Amplitude: 9 mV
Lead Channel Setting Pacing Amplitude: 1.5 V
Lead Channel Setting Pacing Amplitude: 2.75 V
Lead Channel Setting Pacing Pulse Width: 0.8 ms
Lead Channel Setting Sensing Sensitivity: 0.3 mV
MDC IDC LEAD IMPLANT DT: 20160409
MDC IDC LEAD IMPLANT DT: 20160409
MDC IDC MSMT LEADCHNL LV IMPEDANCE VALUE: 836 Ohm
MDC IDC MSMT LEADCHNL LV PACING THRESHOLD PULSEWIDTH: 0.8 ms
MDC IDC MSMT LEADCHNL RA IMPEDANCE VALUE: 456 Ohm
MDC IDC MSMT LEADCHNL RA PACING THRESHOLD AMPLITUDE: 0.375 V
MDC IDC MSMT LEADCHNL RA SENSING INTR AMPL: 1.5 mV
MDC IDC MSMT LEADCHNL RV SENSING INTR AMPL: 9 mV
MDC IDC SESS DTM: 20161018135458
MDC IDC STAT BRADY AS VP PERCENT: 98.99 %
MDC IDC STAT BRADY RA PERCENT PACED: 0 %
MDC IDC STAT BRADY RV PERCENT PACED: 0 %

## 2015-08-25 NOTE — Progress Notes (Signed)
Remote ICD transmission.   

## 2015-08-26 ENCOUNTER — Telehealth: Payer: Self-pay | Admitting: Internal Medicine

## 2015-08-26 NOTE — Telephone Encounter (Signed)
Informed pt that her transmission was received.  

## 2015-08-26 NOTE — Telephone Encounter (Signed)
New Message  Pt calling to see if remote transmission was sent successfully. Please call back and discuss.   

## 2015-09-11 ENCOUNTER — Ambulatory Visit (INDEPENDENT_AMBULATORY_CARE_PROVIDER_SITE_OTHER): Payer: Medicare PPO | Admitting: Family Medicine

## 2015-09-11 ENCOUNTER — Encounter: Payer: Self-pay | Admitting: Family Medicine

## 2015-09-11 VITALS — BP 120/70 | HR 76 | Temp 97.3°F | Resp 16 | Ht 61.0 in | Wt 122.0 lb

## 2015-09-11 DIAGNOSIS — S161XXA Strain of muscle, fascia and tendon at neck level, initial encounter: Secondary | ICD-10-CM | POA: Diagnosis not present

## 2015-09-11 DIAGNOSIS — E119 Type 2 diabetes mellitus without complications: Secondary | ICD-10-CM

## 2015-09-11 DIAGNOSIS — I5022 Chronic systolic (congestive) heart failure: Secondary | ICD-10-CM

## 2015-09-11 DIAGNOSIS — Z23 Encounter for immunization: Secondary | ICD-10-CM | POA: Diagnosis not present

## 2015-09-11 MED ORDER — MELOXICAM 15 MG PO TABS: 15.0000 mg | ORAL_TABLET | Freq: Every day | ORAL | Status: DC

## 2015-09-11 MED ORDER — BACLOFEN 10 MG PO TABS: 10.0000 mg | ORAL_TABLET | Freq: Three times a day (TID) | ORAL | Status: DC

## 2015-09-11 NOTE — Progress Notes (Signed)
Patient ID: Bethany [name], female   DOB: 09/06/1946, 69 y.o.   MRN: [id number]        Patient: Bethany [name] Female    DOB: 12-02-1945   69 y.o.   MRN: [id number] Visit Date: 09/11/2015  Today's Provider: Margarita Rana, MD   Chief Complaint  Patient presents with  . Neck Pain  . Shoulder Pain   Subjective:    Neck Pain  Associated symptoms include chest pain.  Shoulder Pain    Neck Pain: Paitent complains of neck pain. Event that precipitate these symptoms: none known. Onset of symptoms 5 days ago, gradually worsening since that time. Current symptoms are pain in neck and shoulder radiating to head (aching, sharp and throbbing in character; 10/10 in severity) and stiffness in neck and shoulder. Patient denies numbness in arm. Patient has had recurrent self limited episodes of neck pain in the past.  Previous treatments include: none. Patient reports that she took OTC Tylenol last night to help with pain, reports mild relief. Patient reports pain is worse with movement.  Thinks she slept wrong. Also, has a tooth issure.      Edema: Patient complains of edema. The location of the edema is ankle(s) bilateral.  The edema has been mild and moderate.  Onset of symptoms was a few weeks ago, stable since that time. The edema is present all day.  The swelling has been aggravated by walking more, relieved by elevation of involved area, and been associated with nothing. Cardiac risk factors include diabetes mellitus.  Has been eating more salty stuff.  Also complaining of memory impairment. Sometimes forgets her medication.   Also with night sweats. No weight loss or other symptoms.     Allergies  Allergen Reactions  . Other Other (See Comments)    NO BLOOD PRODUCTS- JEHOVAHS WITNESS  . Sulfa Antibiotics Other (See Comments)    Loss of taste   Previous Medications   ACETAMINOPHEN (TYLENOL) 500 MG TABLET    Take 1,000 mg by mouth 2 (two) times daily as needed for mild pain or moderate  pain.   ALCOHOL SWABS (B-D SINGLE USE SWABS REGULAR) PADS    1 packet by Does not apply route daily. To check blood sugar once a day.   ALPRAZOLAM (XANAX) 0.5 MG TABLET    Take 1 tablet (0.5 mg total) by mouth at bedtime as needed for anxiety.   ASPIRIN EC 81 MG TABLET    Take 81 mg by mouth daily.   BLOOD GLUCOSE MONITORING SUPPL (PRODIGY AUTOCODE BLOOD GLUCOSE) W/DEVICE KIT    USE AS DIRECTED   CARVEDILOL (COREG) 6.25 MG TABLET    Take 1 tablet by mouth 2 (two) times daily.   FERROUS SULFATE 325 (65 FE) MG TABLET    Take 325 mg by mouth 2 (two) times daily with a meal.   FUROSEMIDE (LASIX) 20 MG TABLET    Take 1 tablet by mouth daily.   GLUCOSE BLOOD (TRUE METRIX BLOOD GLUCOSE TEST) TEST STRIP    To check blood sugars once a day.   GLUCOSE BLOOD TEST STRIP    PRODIGY NO CODING BLOOD GLUC (In Vitro Strip)  1 (one) Strip Strip To test blood sugar once a day for 0 days  Quantity: 150;  Refills: 3   Ordered :10-Feb-2015  Ashley Royalty ;  Started 10-Feb-2015 Active   LOSARTAN (COZAAR) 25 MG TABLET    Take 1 tablet (25 mg total) by mouth daily.   METFORMIN (GLUCOPHAGE) 500  MG TABLET    TAKE 1 TABLET TWICE DAILY   ONETOUCH DELICA LANCETS 40E MISC       PANTOPRAZOLE (PROTONIX) 40 MG TABLET    TAKE 1 TABLET EVERY DAY   SENNA (SENOKOT) 8.6 MG TABS TABLET    Take 1 tablet by mouth daily as needed for mild constipation.   SYNTHROID 50 MCG TABLET    TAKE 1 TABLET EVERY DAY   TRUEPLUS LANCETS 28G MISC    1 Device by Does not apply route daily. To check blood sugar once a day.    Review of Systems  Constitutional: Negative.   Respiratory: Positive for cough and shortness of breath.   Cardiovascular: Positive for chest pain and leg swelling.  Musculoskeletal: Positive for myalgias, arthralgias and neck pain.    Social History  Substance Use Topics  . Smoking status: Never Smoker   . Smokeless tobacco: Never Used  . Alcohol Use: No   Objective:   BP 120/70 mmHg  Pulse 76  Temp(Src) 97.3 F (36.3 C)  (Oral)  Resp 16  Ht '5\' 1"'  (1.549 m)  Wt 122 lb (55.339 kg)  BMI 23.06 kg/m2  SpO2 97%  LMP  (LMP Unknown)  Physical Exam  Constitutional: She is oriented to person, place, and time. She appears well-developed and well-nourished.  HENT:  Head: Normocephalic and atraumatic.  Right Ear: External ear normal.  Left Ear: External ear normal.  Mouth/Throat: Oropharynx is clear and moist.  Eyes: Conjunctivae and EOM are normal. Pupils are equal, round, and reactive to light.  Neck: Normal range of motion. Neck supple.  Cardiovascular: Normal rate and regular rhythm.   Pulmonary/Chest: Effort normal and breath sounds normal.  Musculoskeletal: Normal range of motion. She exhibits tenderness (left greater than right.  ).  Neurological: She is alert and oriented to person, place, and time.  Psychiatric: She has a normal mood and affect. Her behavior is normal. Judgment and thought content normal.  Clock normal and 2 of 3 for 3 minute recall.      Assessment & Plan:     1. Type 2 diabetes mellitus without complication, without long-term current use of insulin (HCC) Stable.  Continue current medication.    2. Neck strain, initial encounter New problem.  Will treat with medication. Take for as short a time as possible.  Patient instructed to call back if condition worsens or does not improve.    - meloxicam (MOBIC) 15 MG tablet; Take 1 tablet (15 mg total) by mouth daily.  Dispense: 30 tablet; Refill: 0 - baclofen (LIORESAL) 10 MG tablet; Take 1 tablet (10 mg total) by mouth 3 (three) times daily.  Dispense: 30 each; Refill: 0  3. Chronic systolic heart failure (HCC) Stable.   4. Need for pneumococcal vaccination Given today.  - Pneumococcal polysaccharide vaccine 23-valent greater than or equal to 2yo subcutaneous/IM       Margarita Rana, MD  [place]

## 2015-09-13 ENCOUNTER — Emergency Department: Payer: Medicare PPO

## 2015-09-13 ENCOUNTER — Emergency Department
Admission: EM | Admit: 2015-09-13 | Discharge: 2015-09-13 | Disposition: A | Discharge status: Home / Self Care | Payer: Medicare PPO | Attending: Emergency Medicine | Admitting: Emergency Medicine

## 2015-09-13 ENCOUNTER — Encounter: Payer: Self-pay | Admitting: Emergency Medicine

## 2015-09-13 DIAGNOSIS — Z7982 Long term (current) use of aspirin: Secondary | ICD-10-CM | POA: Diagnosis not present

## 2015-09-13 DIAGNOSIS — Z79899 Other long term (current) drug therapy: Secondary | ICD-10-CM | POA: Diagnosis not present

## 2015-09-13 DIAGNOSIS — T428X5A Adverse effect of antiparkinsonism drugs and other central muscle-tone depressants, initial encounter: Secondary | ICD-10-CM | POA: Diagnosis not present

## 2015-09-13 DIAGNOSIS — E119 Type 2 diabetes mellitus without complications: Secondary | ICD-10-CM | POA: Diagnosis not present

## 2015-09-13 DIAGNOSIS — Z791 Long term (current) use of non-steroidal anti-inflammatories (NSAID): Secondary | ICD-10-CM | POA: Insufficient documentation

## 2015-09-13 DIAGNOSIS — R531 Weakness: Secondary | ICD-10-CM

## 2015-09-13 DIAGNOSIS — I10 Essential (primary) hypertension: Secondary | ICD-10-CM | POA: Insufficient documentation

## 2015-09-13 DIAGNOSIS — T50905A Adverse effect of unspecified drugs, medicaments and biological substances, initial encounter: Secondary | ICD-10-CM

## 2015-09-13 LAB — URINALYSIS COMPLETE WITH MICROSCOPIC (ARMC ONLY)
BACTERIA UA: NONE SEEN
Bilirubin Urine: NEGATIVE
Glucose, UA: NEGATIVE mg/dL
HGB URINE DIPSTICK: NEGATIVE
Ketones, ur: NEGATIVE mg/dL
LEUKOCYTES UA: NEGATIVE
NITRITE: NEGATIVE
PROTEIN: NEGATIVE mg/dL
RBC / HPF: NONE SEEN RBC/hpf (ref 0–5)
Specific Gravity, Urine: 1.002 — ABNORMAL LOW (ref 1.005–1.030)
Squamous Epithelial / LPF: NONE SEEN
pH: 6 (ref 5.0–8.0)

## 2015-09-13 LAB — BASIC METABOLIC PANEL
Anion gap: 5 (ref 5–15)
BUN: 11 mg/dL (ref 6–20)
CO2: 30 mmol/L (ref 22–32)
CREATININE: 0.58 mg/dL (ref 0.44–1.00)
Calcium: 9.2 mg/dL (ref 8.9–10.3)
Chloride: 102 mmol/L (ref 101–111)
GFR calc non Af Amer: >60 mL/min (ref >60)
Glucose, Bld: 100 mg/dL — ABNORMAL HIGH (ref 65–99)
Potassium: 3.5 mmol/L (ref 3.5–5.1)
Sodium: 137 mmol/L (ref 135–145)

## 2015-09-13 LAB — CBC
HEMATOCRIT: 35.5 % (ref 35.0–47.0)
HEMOGLOBIN: 11.5 g/dL — AB (ref 12.0–16.0)
MCH: 29.6 pg (ref 26.0–34.0)
MCHC: 32.5 g/dL (ref 32.0–36.0)
MCV: 90.9 fL (ref 80.0–100.0)
Platelets: 99 10*3/uL — ABNORMAL LOW (ref 150–440)
RBC: 3.9 MIL/uL (ref 3.80–5.20)
RDW: 13.2 % (ref 11.5–14.5)
WBC: 5.1 10*3/uL (ref 3.6–11.0)

## 2015-09-13 LAB — TROPONIN I

## 2015-09-13 LAB — GLUCOSE, CAPILLARY: Glucose-Capillary: 85 mg/dL (ref 65–99)

## 2015-09-13 MED ORDER — SODIUM CHLORIDE 0.9 % IV BOLUS (SEPSIS)
250.0000 mL | Freq: Once | INTRAVENOUS | Status: AC
  Administered 2015-09-13: 250 mL via INTRAVENOUS

## 2015-09-13 NOTE — ED Notes (Signed)
MD Mcshane at bedside. 

## 2015-09-13 NOTE — Discharge Instructions (Signed)
Stop taking the baclofen. If you have any new or worrisome symptoms including chest pain shortness of breath nausea vomiting abdominal pain diarrhea headache stiff neck fever or you feel worse in any way return to the emergency department. Follow closely with her doctor tomorrow. Stay with family tonight.

## 2015-09-13 NOTE — ED Notes (Signed)
Pt transported to Xray at this time.

## 2015-09-13 NOTE — ED Provider Notes (Signed)
[facility] Emergency Department Provider Note  ____________________________________________   I have reviewed the triage vital signs and the nursing notes.   HISTORY  Chief Complaint No chief complaint on file.    HPI Bethany [name] is a 69 y.o. female presents today complaining of having had a kink in her neck several days ago. Patient slept funny on the bed and woke up with a trapezius muscle that was cramping. She saw her doctor and was given meloxicam as well as baclofen. Since taking the baclofen, she has noted that she is feeling generally weak. She cannot give me any other concerning symptoms. She has no headache no chest pain or short of breath no nausea no vomiting or diarrhea no dysuria no urinary frequency no exertional symptoms and no leg swelling no cough she states she hasn't been eating or drinking as much today. Her sugar was mildly elevated over 200 but not markedly so. At this time she states she just "doesn't feel quite right".She did not fall she did not hit her head she had no focal numbness or weakness she has no difficulty speaking and she denies headache.  Past Medical History  Diagnosis Date  . Diabetes ([place])   . Hypertension   . Hyperlipidemia   . Reflux   . Diverticulosis   . Thrombocytopenia ([place])   . CHF (congestive heart failure) ([place])   . LV dysfunction   . VHD (valvular heart disease)   . Dilated cardiomyopathy ([place])   . Thyroid disease   . Depression   . GERD (gastroesophageal reflux disease)   . Anxiety     Patient Active Problem List   Diagnosis Date Noted  . GERD (gastroesophageal reflux disease) 08/24/2015  . Automatic implantable cardioverter-defibrillator in situ 07/29/2015  . Hypotension 06/18/2015  . Chest pain on exertion 06/18/2015  . Benign essential HTN 03/20/2015  . Absolute anemia 03/11/2015  . Abnormal liver enzymes 03/11/2015  . Decreased potassium in the blood 03/11/2015  . Hypercholesteremia  03/11/2015  . Adult hypothyroidism 03/11/2015  . Idiopathic insomnia 03/11/2015  . OP (osteoporosis) 03/11/2015  . Diabetes mellitus, type 2 ([place]) 03/11/2015  . NICM (nonischemic cardiomyopathy) ([place]) 02/11/2015  . LBBB (left bundle branch block) 02/11/2015  . Paroxysmal supraventricular tachycardia ([place]) 11/25/2014  . Chronic systolic heart failure ([place]) 09/25/2014  . Heart valve disease 04/10/2014    Past Surgical History  Procedure Laterality Date  . Cardiac catheterization    . Cholecystectomy    . Partial hysterectomy    . Bone marrow biopsy    . Bi-ventricular implantable cardioverter defibrillator [name] 02/11/2015    Procedure: BI-VENTRICULAR IMPLANTABLE CARDIOVERTER DEFIBRILLATOR  (CRT-D);  Surgeon: Deboraha Sprang, MD;  Location: [facility] CATH LAB;  Service: Cardiovascular;  Laterality: [name];  . Lead revision [name] 02/11/2015    Procedure: LEAD REVISION;  Surgeon: Deboraha Sprang, MD;  Location: [facility] CATH LAB;  Service: Cardiovascular;  Laterality: [name];  . Insert / replace / remove pacemaker      Current Outpatient Rx  Name  Route  Sig  Dispense  Refill  . acetaminophen (TYLENOL) 500 MG tablet   Oral   Take 1,000 mg by mouth 2 (two) times daily as needed for mild pain or moderate pain.         . Alcohol Swabs (B-D SINGLE USE SWABS REGULAR) PADS   Does not apply   1 packet by Does not apply route daily. To check blood sugar once a day.   90 each  3   . ALPRAZolam (XANAX) 0.5 MG tablet   Oral   Take 1 tablet (0.5 mg total) by mouth at bedtime as needed for anxiety.   30 tablet   0   . aspirin EC 81 MG tablet   Oral   Take 81 mg by mouth daily.         . baclofen (LIORESAL) 10 MG tablet   Oral   Take 1 tablet (10 mg total) by mouth 3 (three) times daily.   30 each   0   . Blood Glucose Monitoring Suppl (PRODIGY AUTOCODE BLOOD GLUCOSE) W/DEVICE KIT      USE AS DIRECTED   1 each   0   . carvedilol (COREG) 6.25 MG tablet   Oral   Take 1 tablet by mouth 2 (two) times  daily.         . ferrous sulfate 325 (65 FE) MG tablet   Oral   Take 325 mg by mouth 2 (two) times daily with a meal.         . furosemide (LASIX) 20 MG tablet   Oral   Take 1 tablet by mouth daily.         Marland Kitchen glucose blood (TRUE METRIX BLOOD GLUCOSE TEST) test strip      To check blood sugars once a day.   100 each   3   . glucose blood test strip      PRODIGY NO CODING BLOOD GLUC (In Vitro Strip)  1 (one) Strip Strip To test blood sugar once a day for 0 days  Quantity: 150;  Refills: 3   Ordered :10-Feb-2015  Ashley Royalty ;  Started 10-Feb-2015 Active         . losartan (COZAAR) 25 MG tablet   Oral   Take 1 tablet (25 mg total) by mouth daily.   90 tablet   1   . meloxicam (MOBIC) 15 MG tablet   Oral   Take 1 tablet (15 mg total) by mouth daily.   30 tablet   0   . metFORMIN (GLUCOPHAGE) 500 MG tablet      TAKE 1 TABLET TWICE DAILY   180 tablet   3   . ONETOUCH DELICA LANCETS 88C MISC                 Dispense as written.   . pantoprazole (PROTONIX) 40 MG tablet      TAKE 1 TABLET EVERY DAY   90 tablet   3   . senna (SENOKOT) 8.6 MG TABS tablet   Oral   Take 1 tablet by mouth daily as needed for mild constipation.         Marland Kitchen SYNTHROID 50 MCG tablet      TAKE 1 TABLET EVERY DAY   90 tablet   3   . TRUEPLUS LANCETS 28G MISC   Does not apply   1 Device by Does not apply route daily. To check blood sugar once a day.   100 each   3     Allergies Other and Sulfa antibiotics  Family History  Problem Relation Age of Onset  . Heart disease Mother   . Heart attack Mother   . Prostate cancer Father   . Ulcers Father   . Stroke Sister   . Heart attack Sister   . Diabetes Sister   . Heart attack Brother   . Stroke Brother     Social History Social History  Substance  Use Topics  . Smoking status: Never Smoker   . Smokeless tobacco: Never Used  . Alcohol Use: No    Review of Systems Constitutional: No fever/chills Eyes: No visual  changes. ENT: No sore throat. No stiff neck no neck pain Cardiovascular: Denies chest pain. Respiratory: Denies shortness of breath. Gastrointestinal:   no vomiting.  No diarrhea.  No constipation. Genitourinary: Negative for dysuria. Musculoskeletal: Negative lower extremity swelling Skin: Negative for rash. Neurological: Negative for headaches, focal weakness or numbness. 10-point ROS otherwise negative.  ____________________________________________   PHYSICAL EXAM:  VITAL SIGNS: ED Triage Vitals  Enc Vitals Group     BP 09/13/15 1351 125/74 mmHg     Pulse Rate 09/13/15 1351 62     Resp 09/13/15 1351 18     Temp 09/13/15 1351 97.5 F (36.4 C)     Temp Source 09/13/15 1351 Oral     SpO2 09/13/15 1351 100 %     Weight 09/13/15 1351 118 lb (53.524 kg)     Height 09/13/15 1351 '5\' 1"'  (1.549 m)     Head Cir --      Peak Flow --      Pain Score 09/13/15 1424 2     Pain Loc --      Pain Edu? --      Excl. in [place]? --     Constitutional: Alert and oriented. Well appearing and in no acute distress. Eyes: Conjunctivae are normal. PERRL. EOMI. Head: Atraumatic. Nose: No congestion/rhinnorhea. Mouth/Throat: Mucous membranes are moist.  Oropharynx non-erythematous. Neck: No stridor.   There is tenderness to palpation in the left trapezius muscle which reproduces the discomfort with no evidence of shingles or infection no meningismus Cardiovascular: Normal rate, regular rhythm. Grossly normal heart sounds.  Good peripheral circulation. Respiratory: Normal respiratory effort.  No retractions. Lungs CTAB. Abdominal: Soft and nontender. No distention. No guarding no rebound Back:  There is no focal tenderness or step off there is no midline tenderness there are no lesions noted. there is no CVA tenderness Musculoskeletal: No lower extremity tenderness. No joint effusions, no DVT signs strong distal pulses no edema Neurologic:  Normal speech and language. No gross focal neurologic  deficits are appreciated.  Skin:  Skin is warm, dry and intact. No rash noted. Psychiatric: Mood and affect are normal. Speech and behavior are normal.  ____________________________________________   LABS (all labs ordered are listed, but only abnormal results are displayed)  Labs Reviewed  BASIC METABOLIC PANEL - Abnormal; Notable for the following:    Glucose, Bld 100 (*)    All other components within normal limits  CBC - Abnormal; Notable for the following:    Hemoglobin 11.5 (*)    Platelets 99 (*)    All other components within normal limits  URINALYSIS COMPLETEWITH MICROSCOPIC ([facility] ONLY) - Abnormal; Notable for the following:    Color, Urine STRAW (*)    APPearance CLEAR (*)    Specific Gravity, Urine 1.002 (*)    All other components within normal limits  GLUCOSE, CAPILLARY  TROPONIN I  CBG MONITORING, ED   ____________________________________________  EKG  I personally interpreted any EKGs ordered by me or triage AV pacer is noted no acute ischemia rate is 56 ____________________________________________  RADIOLOGY  I reviewed any imaging ordered by me or triage that were performed during my shift ____________________________________________   PROCEDURES  Procedure(s) performed: None  Critical Care performed: None  ____________________________________________   INITIAL IMPRESSION / ASSESSMENT AND PLAN / ED COURSE  Pertinent labs & imaging results that were available during my care of the patient were reviewed by me and considered in my medical decision making (see chart for details).  Patient here with a very vague complaint of feeling generally unwell after starting baclofen. Physical exam is unremarkable, no evidence of acute CVA, blood work is reassuring urinalysis reassuring chest x-ray is reassuring EKG is reassuring blood glucose is reassuring vital signs are reassuring chest x-ray is reassuring urinalysis is reassuring kidney function is  reassuring we will give her a small bolus of IV fluid given her history of heart failure as she does appear somewhat dry and will reassess. ____________________________________________   FINAL CLINICAL IMPRESSION(S) / ED DIAGNOSES  Final diagnoses:  None     Schuyler Amor, MD 09/13/15 1701

## 2015-09-13 NOTE — ED Notes (Signed)
C/O nausea and blood sugar was elevated this morning (138) then up to 230 at 1100.  Also generally feeling weak x 1 day.  Started on Baclofen on Friday for muscle spasm.

## 2015-09-13 NOTE — ED Notes (Signed)
Called lab for add on of troponin. Processing now

## 2015-09-14 ENCOUNTER — Telehealth: Payer: Self-pay | Admitting: Family Medicine

## 2015-09-14 NOTE — Telephone Encounter (Signed)
Advised patient as below.  

## 2015-09-14 NOTE — Telephone Encounter (Signed)
Pt stated that she thinks she reacted to baclofen (LIORESAL) 10 MG tablet that she started taking Friday 09/11/15. Pt stated she went to the ER 09/13/15 because she was feeling really weak. Pt stated she last took the medication on 09/13/15 and the ER advised her to contact our office today. Thanks TNP

## 2015-09-14 NOTE — Telephone Encounter (Signed)
All muscle relaxers will make her weak.  Not sure what else to do. Thanks.

## 2015-09-15 ENCOUNTER — Telehealth: Payer: Self-pay | Admitting: Cardiology

## 2015-09-15 ENCOUNTER — Encounter: Payer: Self-pay | Admitting: Family Medicine

## 2015-09-15 NOTE — Telephone Encounter (Signed)
Pt called and stated that her defib beeped. Instructed pt to send manual transmission. Pt verbalized understanding.

## 2015-09-15 NOTE — Telephone Encounter (Signed)
Informed patient that ICD remote was received. No alerts noted on transmission. I asked patient if she heard a solid beep or if she heard more of a siren like tone. Patient stated that she heard more of a solid beep. I asked patient if she had been near any magnets. Patient says that she could have been near a magnet when she was looking for some "push pins". I explained to her about the importance of staying away from magnets and about the type of tone she would hear for a high priority alert. Patient voiced understanding. Encouraged patient to call back with any further issues.

## 2015-09-16 ENCOUNTER — Ambulatory Visit: Payer: Medicare PPO | Attending: Family | Admitting: Family

## 2015-09-16 ENCOUNTER — Encounter: Payer: Self-pay | Admitting: Family

## 2015-09-16 VITALS — BP 132/73 | HR 62 | Resp 20 | Ht 61.0 in | Wt 122.0 lb

## 2015-09-16 DIAGNOSIS — I38 Endocarditis, valve unspecified: Secondary | ICD-10-CM | POA: Insufficient documentation

## 2015-09-16 DIAGNOSIS — K219 Gastro-esophageal reflux disease without esophagitis: Secondary | ICD-10-CM | POA: Insufficient documentation

## 2015-09-16 DIAGNOSIS — R079 Chest pain, unspecified: Secondary | ICD-10-CM | POA: Diagnosis not present

## 2015-09-16 DIAGNOSIS — E785 Hyperlipidemia, unspecified: Secondary | ICD-10-CM | POA: Diagnosis not present

## 2015-09-16 DIAGNOSIS — Z79899 Other long term (current) drug therapy: Secondary | ICD-10-CM | POA: Insufficient documentation

## 2015-09-16 DIAGNOSIS — I10 Essential (primary) hypertension: Secondary | ICD-10-CM | POA: Insufficient documentation

## 2015-09-16 DIAGNOSIS — E119 Type 2 diabetes mellitus without complications: Secondary | ICD-10-CM | POA: Diagnosis not present

## 2015-09-16 DIAGNOSIS — D696 Thrombocytopenia, unspecified: Secondary | ICD-10-CM | POA: Diagnosis not present

## 2015-09-16 DIAGNOSIS — F419 Anxiety disorder, unspecified: Secondary | ICD-10-CM | POA: Insufficient documentation

## 2015-09-16 DIAGNOSIS — I509 Heart failure, unspecified: Secondary | ICD-10-CM | POA: Insufficient documentation

## 2015-09-16 DIAGNOSIS — I420 Dilated cardiomyopathy: Secondary | ICD-10-CM | POA: Diagnosis not present

## 2015-09-16 DIAGNOSIS — I5023 Acute on chronic systolic (congestive) heart failure: Secondary | ICD-10-CM

## 2015-09-16 NOTE — Patient Instructions (Addendum)
Continue weighing daily and call for an overnight weight gain of > 2 pounds or a weekly weight gain of >5 pounds.    Double fluid pill for the next two days only.

## 2015-09-16 NOTE — Progress Notes (Signed)
Subjective:    Patient ID: Bethany [name], female    DOB: [birth year], 69 y.o.   MRN: [id number]  Congestive Heart Failure Presents for follow-up visit. The disease [name] has been worsening. Associated symptoms include abdominal pain (at times), edema, fatigue, orthopnea (sleeps chronically on 3-4 pillows) and shortness of breath (little bit). Pertinent negatives include no chest pain or palpitations. The symptoms have been worsening. Past treatments include salt and fluid restriction, beta blockers and angiotensin receptor blockers. The treatment provided moderate relief. Compliance with prior treatments has been good. Her past medical history is significant for DM, HTN and valvular heart disease. She has multiple 1st degree relatives with heart disease. Compliance with total regimen is 76-100%.  Chest Pain  This is a recurrent problem. The current episode started in the past 7 days. The onset quality is gradual. The problem occurs intermittently. The problem has been resolved. The pain is present in the substernal region. The pain is at a severity of 2/10. The pain is mild. The quality of the pain is described as heavy. The pain does not radiate. Associated symptoms include abdominal pain (at times), a cough ("not much"), nausea, shortness of breath (little bit) and weakness. Pertinent negatives include no back pain, dizziness, headaches or palpitations. The pain is aggravated by nothing. She has tried rest for the symptoms. The treatment provided significant relief. Risk factors include being elderly and post-menopausal.  Her past medical history is significant for CHF, diabetes, DM, hyperlipidemia, HTN, hypertension and valve disorder.  Her family medical history is significant for diabetes, heart disease and stroke. Prior diagnostic workup includes cardiac catherization and echocardiogram.  Other This is a recurrent (edema) problem. The current episode started 1 to 4 weeks ago. The problem occurs  daily. The problem has been gradually worsening. Associated symptoms include abdominal pain (at times), chills, coughing ("not much"), fatigue, nausea and weakness. Pertinent negatives include no chest pain, congestion, headaches, neck pain or sore throat. The symptoms are aggravated by walking and standing. She has tried position changes for the symptoms. The treatment provided mild relief.   Past Medical History  Diagnosis Date  . Diabetes ([place])   . Hypertension   . Hyperlipidemia   . Reflux   . Diverticulosis   . Thrombocytopenia ([place])   . CHF (congestive heart failure) ([place])   . LV dysfunction   . VHD (valvular heart disease)   . Dilated cardiomyopathy ([place])   . Thyroid disease   . Depression   . GERD (gastroesophageal reflux disease)   . Anxiety     Past Surgical History  Procedure Laterality Date  . Cardiac catheterization    . Cholecystectomy    . Partial hysterectomy    . Bone marrow biopsy    . Bi-ventricular implantable cardioverter defibrillator [name] 02/11/2015    Procedure: BI-VENTRICULAR IMPLANTABLE CARDIOVERTER DEFIBRILLATOR  (CRT-D);  Surgeon: Deboraha Sprang, MD;  Location[facility] CATH LAB;  Service: Cardiovascular;  Laterality: [name];  . Lead revision [name] 02/11/2015    Procedure: LEAD REVISION;  Surgeon: Deboraha Sprang, MD;  Location: [facility] CATH LAB;  Service: Cardiovascular;  Laterality: [name];  . Insert / replace / remove pacemaker      Family History  Problem Relation Age of Onset  . Heart disease Mother   . Heart attack Mother   . Prostate cancer Father   . Ulcers Father   . Stroke Sister   . Heart attack Sister   . Diabetes Sister   . Heart attack  Brother   . Stroke Brother     Social History  Substance Use Topics  . Smoking status: Never Smoker   . Smokeless tobacco: Never Used  . Alcohol Use: No    Allergies  Allergen Reactions  . Other Other (See Comments)    NO BLOOD PRODUCTS- JEHOVAHS WITNESS  . Sulfa Antibiotics Other (See Comments)    Loss of  taste    Prior to Admission medications   Medication Sig Start Date End Date Taking? Authorizing Provider  acetaminophen (TYLENOL) 500 MG tablet Take 1,000 mg by mouth 2 (two) times daily as needed for mild pain or moderate pain.   Yes Historical Provider, MD  Alcohol Swabs (B-D SINGLE USE SWABS REGULAR) PADS 1 packet by Does not apply route daily. To check blood sugar once a day. 04/14/15  Yes Margarita Rana, MD  ALPRAZolam Duanne Moron) 0.5 MG tablet Take 1 tablet (0.5 mg total) by mouth at bedtime as needed for anxiety. 05/13/15  Yes Margarita Rana, MD  aspirin EC 81 MG tablet Take 81 mg by mouth daily.   Yes Historical Provider, MD  Blood Glucose Monitoring Suppl (PRODIGY AUTOCODE BLOOD GLUCOSE) W/DEVICE KIT USE AS DIRECTED 04/09/15  Yes Margarita Rana, MD  carvedilol (COREG) 6.25 MG tablet Take 1 tablet by mouth 2 (two) times daily. 03/26/15  Yes Historical Provider, MD  ferrous sulfate 325 (65 FE) MG tablet Take 325 mg by mouth 2 (two) times daily with a meal.   Yes Historical Provider, MD  furosemide (LASIX) 20 MG tablet Take 1 tablet by mouth daily. 09/22/14 09/22/15 Yes Historical Provider, MD  glucose blood (TRUE METRIX BLOOD GLUCOSE TEST) test strip To check blood sugars once a day. 04/14/15  Yes Margarita Rana, MD  glucose blood test strip PRODIGY NO CODING BLOOD GLUC (In Vitro Strip)  1 (one) Strip Strip To test blood sugar once a day for 0 days  Quantity: 150;  Refills: 3   Ordered :10-Feb-2015  Ashley Royalty ;  Started 10-Feb-2015 Active 02/10/15  Yes Historical Provider, MD  losartan (COZAAR) 25 MG tablet Take 1 tablet (25 mg total) by mouth daily. 03/20/15  Yes Deboraha Sprang, MD  meloxicam (MOBIC) 15 MG tablet Take 1 tablet (15 mg total) by mouth daily. 09/11/15  Yes Margarita Rana, MD  metFORMIN (GLUCOPHAGE) 500 MG tablet TAKE 1 TABLET TWICE DAILY 05/27/15  Yes Margarita Rana, MD  [facility] LANCETS 53G [place]  01/20/15  Yes Historical Provider, MD  pantoprazole (PROTONIX) 40 MG tablet TAKE 1 TABLET EVERY  DAY 08/24/15  Yes Margarita Rana, MD  senna (SENOKOT) 8.6 MG TABS tablet Take 1 tablet by mouth daily as needed for mild constipation.   Yes Historical Provider, MD  SYNTHROID 50 MCG tablet TAKE 1 TABLET EVERY DAY 05/27/15  Yes Margarita Rana, MD  TRUEPLUS LANCETS 28G MISC 1 Device by Does not apply route daily. To check blood sugar once a day. 04/14/15  Yes Margarita Rana, MD      Review of Systems  Constitutional: Positive for chills and fatigue. Negative for appetite change.  HENT: Negative for congestion, postnasal drip and sore throat.   Eyes: Negative.   Respiratory: Positive for cough ("not much"), chest tightness (heaviness) and shortness of breath (little bit).   Cardiovascular: Positive for leg swelling. Negative for chest pain and palpitations.  Gastrointestinal: Positive for nausea, abdominal pain (at times) and abdominal distention.  Endocrine: Negative.   Genitourinary: Negative.   Musculoskeletal: Negative for back pain and neck pain.  Skin: Negative.   Allergic/Immunologic: Negative.   Neurological: Positive for weakness and light-headedness. Negative for dizziness and headaches.  Hematological: Negative for adenopathy. Does not bruise/bleed easily.  Psychiatric/Behavioral: Negative for sleep disturbance (sleeping on 3-4 pillows) and dysphoric mood. The patient is not nervous/anxious.        Objective:   Physical Exam  Constitutional: She is oriented to person, place, and time. She appears well-developed and well-nourished.  HENT:  Head: Normocephalic and atraumatic.  Eyes: Conjunctivae are normal. Pupils are equal, round, and reactive to light.  Neck: Normal range of motion. Neck supple.  Cardiovascular: Normal rate and regular rhythm.   Pulmonary/Chest: Effort normal and breath sounds normal. She has no wheezes. She has no rales.  Abdominal: Soft. She exhibits distension. There is no tenderness.  Musculoskeletal: She exhibits edema. She exhibits no tenderness.   Neurological: She is alert and oriented to person, place, and time.  Skin: Skin is warm and dry.  Psychiatric: She has a normal mood and affect. Her behavior is normal. Thought content normal.  Nursing note and vitals reviewed.   BP 132/73 mmHg  Pulse 62  Resp 20  Ht '5\' 1"'  (1.549 m)  Wt 122 lb (55.339 kg)  BMI 23.06 kg/m2  SpO2 100%  LMP  (LMP Unknown)       Assessment & Plan:  1: Acute on chronic heart failure with reduced ejection fraction- Patient presents with an increase in her edema, little more shortness of breath along with a gradual weight gain. By our scale, she has gained 8 pounds since she was last here on 06/18/15. Based on increased edema and weight gain, advised patient to increase her furosemide to 41m daily for the next two days only. She is to continue weighing daily and she was reminded to call for an overnight weight gain of >2 pounds or a weekly weight gain of >5 pounds. She doesn't add salt and tries to diligently follow a low sodium diet. Did receive her pneumococcal vaccine on 09/11/15.  2: Chest pain- She describes this as a heaviness in the substernal area that doesn't radiate anywhere. Was just in the ER on 09/13/15 due to weakness and dizziness and everything was ruled out. She does have a history of sporadic chest pain. Encouraged her to followup with her cardiologist if her anginal pattern changes or worsens. 3: HTN- Blood pressure looks fine today.   Patient could be having a viral syndrome also going on with her chills, weakness, fatigue, nausea and sweats. No fever in the office and she was instructed to followup with her PCP should these symptoms not improve.   Return here in 1 month or sooner for any questions/problems before then.

## 2015-09-18 ENCOUNTER — Telehealth: Payer: Self-pay

## 2015-09-18 ENCOUNTER — Ambulatory Visit: Payer: Medicare PPO | Admitting: Family

## 2015-09-18 ENCOUNTER — Other Ambulatory Visit: Payer: Self-pay | Admitting: Family

## 2015-09-18 MED ORDER — SPIRONOLACTONE 25 MG PO TABS: 25.0000 mg | ORAL_TABLET | Freq: Every day | ORAL | Status: DC

## 2015-09-18 NOTE — Telephone Encounter (Signed)
Spoke with Bethany [name] she will pick up her prescription today, and an appointment was made for 11/18 at 10:30 am.

## 2015-09-18 NOTE — Telephone Encounter (Signed)
Ms. Eargle called the office this morning to report an overnight weight gain 3lbs. She states she was 119 yesterday morning and this morning she weighed and she was 123 lbs. She does admit to having increased her fluid intake, stating that she was placed on another medication at her visit and did not want to become dehydrated.

## 2015-09-18 NOTE — Telephone Encounter (Signed)
Will begin spironolactone 25mg  daily in addition to her other medications. Will want to see her either at the end of next week or at least before Thanksgiving for a followup appointment and to check lab work.  I will send the prescription to the pharmacy and have her call us next week if there's any problems with the medication.

## 2015-09-23 HISTORY — PX: TOOTH EXTRACTION: SUR596

## 2015-09-25 ENCOUNTER — Ambulatory Visit: Payer: Medicare PPO | Attending: Family | Admitting: Family

## 2015-09-25 ENCOUNTER — Other Ambulatory Visit
Admission: RE | Admit: 2015-09-25 | Discharge: 2015-09-25 | Disposition: A | Discharge status: Home / Self Care | Payer: Medicare PPO | Source: Ambulatory Visit | Attending: Family | Admitting: Family

## 2015-09-25 ENCOUNTER — Encounter: Payer: Self-pay | Admitting: Family

## 2015-09-25 VITALS — BP 143/90 | HR 57 | Resp 20 | Ht 61.0 in | Wt 125.0 lb

## 2015-09-25 DIAGNOSIS — F329 Major depressive disorder, single episode, unspecified: Secondary | ICD-10-CM | POA: Insufficient documentation

## 2015-09-25 DIAGNOSIS — Z79899 Other long term (current) drug therapy: Secondary | ICD-10-CM | POA: Diagnosis not present

## 2015-09-25 DIAGNOSIS — Z7984 Long term (current) use of oral hypoglycemic drugs: Secondary | ICD-10-CM | POA: Insufficient documentation

## 2015-09-25 DIAGNOSIS — K219 Gastro-esophageal reflux disease without esophagitis: Secondary | ICD-10-CM | POA: Diagnosis not present

## 2015-09-25 DIAGNOSIS — E119 Type 2 diabetes mellitus without complications: Secondary | ICD-10-CM | POA: Diagnosis not present

## 2015-09-25 DIAGNOSIS — Z7982 Long term (current) use of aspirin: Secondary | ICD-10-CM | POA: Diagnosis not present

## 2015-09-25 DIAGNOSIS — E079 Disorder of thyroid, unspecified: Secondary | ICD-10-CM | POA: Insufficient documentation

## 2015-09-25 DIAGNOSIS — I420 Dilated cardiomyopathy: Secondary | ICD-10-CM | POA: Diagnosis not present

## 2015-09-25 DIAGNOSIS — I10 Essential (primary) hypertension: Secondary | ICD-10-CM

## 2015-09-25 DIAGNOSIS — F419 Anxiety disorder, unspecified: Secondary | ICD-10-CM | POA: Diagnosis not present

## 2015-09-25 DIAGNOSIS — R079 Chest pain, unspecified: Secondary | ICD-10-CM

## 2015-09-25 DIAGNOSIS — I5022 Chronic systolic (congestive) heart failure: Secondary | ICD-10-CM | POA: Diagnosis present

## 2015-09-25 DIAGNOSIS — E785 Hyperlipidemia, unspecified: Secondary | ICD-10-CM | POA: Diagnosis not present

## 2015-09-25 LAB — BASIC METABOLIC PANEL
ANION GAP: 5 (ref 5–15)
BUN: 17 mg/dL (ref 6–20)
CALCIUM: 9.4 mg/dL (ref 8.9–10.3)
CO2: 32 mmol/L (ref 22–32)
CREATININE: 0.79 mg/dL (ref 0.44–1.00)
Chloride: 101 mmol/L (ref 101–111)
Glucose, Bld: 131 mg/dL — ABNORMAL HIGH (ref 65–99)
Potassium: 4.2 mmol/L (ref 3.5–5.1)
Sodium: 138 mmol/L (ref 135–145)

## 2015-09-25 NOTE — Progress Notes (Signed)
Subjective:    Patient ID: Bethany [name], female    DOB: 1946/07/18, 69 y.o.   MRN: [id number]  Congestive Heart Failure Presents for follow-up visit. The disease [name] has been stable. Associated symptoms include chest pain, chest pressure, edema (little bit), fatigue, orthopnea (sleeping on 3 pillows), palpitations and shortness of breath. Pertinent negatives include no abdominal pain. The symptoms have been stable. Past treatments include aldosterone receptor blockers, salt and fluid restriction, beta blockers and angiotensin receptor blockers. The treatment provided moderate relief. Compliance with prior treatments has been good. Her past medical history is significant for DM and HTN. Compliance with total regimen is 76-100%.  Chest Pain  This is a recurrent problem. The current episode started more than 1 month ago. The onset quality is undetermined. The problem occurs intermittently. The problem has been unchanged. The pain is present in the epigastric region. The pain is at a severity of 5/10. The pain is mild. The quality of the pain is described as heavy. The pain does not radiate. Associated symptoms include a cough (at times), palpitations and shortness of breath. Pertinent negatives include no abdominal pain, back pain, dizziness, headaches, nausea, numbness or vomiting. The pain is aggravated by nothing. She has tried rest for the symptoms. The treatment provided mild relief. Risk factors include being elderly and post-menopausal.  Her past medical history is significant for CHF, diabetes, DM, hyperlipidemia, HTN, hypertension and thyroid problem.  Her family medical history is significant for heart disease and stroke. Prior diagnostic workup includes cardiac catherization, echocardiogram, exercise treadmill test and chest x-ray.    Past Medical History  Diagnosis Date  . Diabetes ([place])   . Hypertension   . Hyperlipidemia   . Reflux   . Diverticulosis   . Thrombocytopenia ([place])    . CHF (congestive heart failure) ([place])   . LV dysfunction   . VHD (valvular heart disease)   . Dilated cardiomyopathy ([place])   . Thyroid disease   . Depression   . GERD (gastroesophageal reflux disease)   . Anxiety     Past Surgical History  Procedure Laterality Date  . Cardiac catheterization    . Cholecystectomy    . Partial hysterectomy    . Bone marrow biopsy    . Bi-ventricular implantable cardioverter defibrillator [name] 02/11/2015    Procedure: BI-VENTRICULAR IMPLANTABLE CARDIOVERTER DEFIBRILLATOR  (CRT-D);  Surgeon: Deboraha Sprang, MD;  Location: [facility] CATH LAB;  Service: Cardiovascular;  Laterality: [name];  . Lead revision [name] 02/11/2015    Procedure: LEAD REVISION;  Surgeon: Deboraha Sprang, MD;  Location: [facility] CATH LAB;  Service: Cardiovascular;  Laterality: [name];  . Insert / replace / remove pacemaker    . Tooth extraction  09/23/2015    Family History  Problem Relation Age of Onset  . Heart disease Mother   . Heart attack Mother   . Prostate cancer Father   . Ulcers Father   . Stroke Sister   . Heart attack Sister   . Diabetes Sister   . Heart attack Brother   . Stroke Brother     Social History  Substance Use Topics  . Smoking status: Never Smoker   . Smokeless tobacco: Never Used  . Alcohol Use: No    Allergies  Allergen Reactions  . Other Other (See Comments)    NO BLOOD PRODUCTS- JEHOVAHS WITNESS  . Sulfa Antibiotics Other (See Comments)    Loss of taste    Prior to Admission medications   Medication Sig  Start Date End Date Taking? Authorizing Provider  acetaminophen (TYLENOL) 500 MG tablet Take 1,000 mg by mouth 2 (two) times daily as needed for mild pain or moderate pain.   Yes Historical Provider, MD  Alcohol Swabs (B-D SINGLE USE SWABS REGULAR) PADS 1 packet by Does not apply route daily. To check blood sugar once a day. 04/14/15  Yes Margarita Rana, MD  ALPRAZolam Duanne Moron) 0.5 MG tablet Take 1 tablet (0.5 mg total) by mouth at bedtime as needed for anxiety.  05/13/15  Yes Margarita Rana, MD  aspirin EC 81 MG tablet Take 81 mg by mouth daily.   Yes Historical Provider, MD  Blood Glucose Monitoring Suppl (PRODIGY AUTOCODE BLOOD GLUCOSE) W/DEVICE KIT USE AS DIRECTED 04/09/15  Yes Margarita Rana, MD  carvedilol (COREG) 6.25 MG tablet Take 1 tablet by mouth 2 (two) times daily. 03/26/15  Yes Historical Provider, MD  Cetylpyridinium Chloride (ANTISEPTIC ORAL RINSE MT) Use as directed 1 Dose in the mouth or throat 2 (two) times daily.   Yes Historical Provider, MD  ferrous sulfate 325 (65 FE) MG tablet Take 325 mg by mouth 2 (two) times daily with a meal.   Yes Historical Provider, MD  glucose blood (TRUE METRIX BLOOD GLUCOSE TEST) test strip To check blood sugars once a day. 04/14/15  Yes Margarita Rana, MD  glucose blood test strip PRODIGY NO CODING BLOOD GLUC (In Vitro Strip)  1 (one) Strip Strip To test blood sugar once a day for 0 days  Quantity: 150;  Refills: 3   Ordered :10-Feb-2015  Ashley Royalty ;  Started 10-Feb-2015 Active 02/10/15  Yes Historical Provider, MD  losartan (COZAAR) 25 MG tablet Take 1 tablet (25 mg total) by mouth daily. 03/20/15  Yes Deboraha Sprang, MD  meloxicam (MOBIC) 15 MG tablet Take 1 tablet (15 mg total) by mouth daily. 09/11/15  Yes Margarita Rana, MD  metFORMIN (GLUCOPHAGE) 500 MG tablet TAKE 1 TABLET TWICE DAILY 05/27/15  Yes Margarita Rana, MD  [facility] LANCETS [address] [place]  01/20/15  Yes Historical Provider, MD  pantoprazole (PROTONIX) 40 MG tablet TAKE 1 TABLET EVERY DAY 08/24/15  Yes Margarita Rana, MD  senna (SENOKOT) 8.6 MG TABS tablet Take 1 tablet by mouth daily as needed for mild constipation.   Yes Historical Provider, MD  spironolactone (ALDACTONE) 25 MG tablet Take 1 tablet (25 mg total) by mouth daily. 09/18/15  Yes Alisa Graff, FNP  SYNTHROID 50 MCG tablet TAKE 1 TABLET EVERY DAY 05/27/15  Yes Margarita Rana, MD  TRUEPLUS LANCETS 28G MISC 1 Device by Does not apply route daily. To check blood sugar once a day. 04/14/15  Yes Margarita Rana, MD  furosemide (LASIX) 20 MG tablet Take 1 tablet by mouth daily. 09/22/14 09/22/15  Historical Provider, MD     Review of Systems  Constitutional: Positive for appetite change (tooth got pulled yesterday) and fatigue.  HENT: Positive for congestion, dental problem (left upper tooth got pulled yesterday) and postnasal drip. Negative for sore throat.   Eyes: Negative.   Respiratory: Positive for cough (at times), chest tightness (heaviness) and shortness of breath.   Cardiovascular: Positive for chest pain, palpitations and leg swelling.  Gastrointestinal: Positive for abdominal distention. Negative for nausea, vomiting and abdominal pain.  Endocrine: Negative.   Genitourinary: Negative.   Musculoskeletal: Negative for back pain and neck pain.  Skin: Negative.   Allergic/Immunologic: Negative.   Neurological: Positive for light-headedness. Negative for dizziness, numbness and headaches.  Hematological: Negative for adenopathy.  Does not bruise/bleed easily.  Psychiatric/Behavioral: Negative for sleep disturbance (sleeping on  3 pillows) and dysphoric mood. The patient is not nervous/anxious.        Objective:   Physical Exam  Constitutional: She is oriented to person, place, and time. She appears well-developed and well-nourished.  HENT:  Head: Normocephalic and atraumatic.  Eyes: Conjunctivae are normal. Pupils are equal, round, and reactive to light.  Neck: Normal range of motion. Neck supple.  Cardiovascular: Regular rhythm.  Bradycardia present.   Pulmonary/Chest: Effort normal. She has no wheezes. She has no rales.  Abdominal: Soft. She exhibits no distension. There is no tenderness.  Musculoskeletal: She exhibits edema (trace amount nonpitting). She exhibits no tenderness.  Neurological: She is alert and oriented to person, place, and time.  Skin: Skin is warm and dry.  Psychiatric: She has a normal mood and affect. Her behavior is normal. Thought content normal.   Nursing note and vitals reviewed.   BP 143/90 mmHg  Pulse 57  Resp 20  Ht '5\' 1"'  (1.549 m)  Wt 125 lb (56.7 kg)  BMI 23.63 kg/m2  SpO2 99%  LMP  (LMP Unknown)       Assessment & Plan:  1: Chronic heart failure with reduced ejection fraction- Patient presents as a followup after beginning spironolactone. She says that she feels a little bit better since beginning the medication. She still feels tired and more so since she got a tooth pulled yesterday and is taking pain medications. She still gets short of breath upon exertion as well but doesn't feel like it's any worse. Continues to weigh herself and has gained a few pounds. By our scale, she has gained 3 pounds since 09/17/15 but she does have heavier clothes on as well. Reminded to call for an overnight weight gain of >2 pounds or a weekly weight gain of >5 pounds. She is not adding any salt to her food and is trying to closely follow a low sodium diet. Basic chemistry panel drawn today and patient was called with the results. Potassium level and renal function look good so will continue medications at this time. 2: HTN- Blood pressure is a little elevated but this could be due to the pain in her mouth. Continue to monitor. She sees her PCP January 2017. 3: Chest heaviness- She describes this as her same anginal pattern that she's previously had. No radiation of pain, no other symptoms and it resolves on its own. Has been worked up in the past but she was instructed to call her cardiologist should this pattern change or worsen.  Return in 3 months or sooner for any questions/problems before then.

## 2015-09-25 NOTE — Patient Instructions (Signed)
Continue weighing daily and call for an overnight weight gain of > 2 pounds or a weekly weight gain of >5 pounds. 

## 2015-10-09 ENCOUNTER — Telehealth: Payer: Self-pay | Admitting: Family Medicine

## 2015-10-09 DIAGNOSIS — M79673 Pain in unspecified foot: Secondary | ICD-10-CM

## 2015-10-09 NOTE — Telephone Encounter (Signed)
Pt would like a referral to [facility] to be fitted for shoes because her instep has dropped and regular shoes hurt her feet. Pt was hoping the referral could be done off her last OV but understands if it can not and will make an appt if needed. Pt also stated that if Dr. Elease Hashimoto thinks there is a better place other than [facility] she is fine with going where Dr. Elease Hashimoto thinks is best. Pt is aware that Dr. Elease Hashimoto is out of the office until Monday. Please advise.

## 2015-10-09 NOTE — Telephone Encounter (Signed)
Ok to refer to [facility]. Thanks.

## 2015-10-09 NOTE — Telephone Encounter (Signed)
Referral added.   Thanks,   -Alesha Jaffee  

## 2015-10-14 DIAGNOSIS — I5023 Acute on chronic systolic (congestive) heart failure: Secondary | ICD-10-CM | POA: Insufficient documentation

## 2015-10-16 ENCOUNTER — Ambulatory Visit: Payer: Medicare PPO | Admitting: Family

## 2015-10-20 ENCOUNTER — Inpatient Hospital Stay: Payer: Medicare PPO | Attending: Internal Medicine

## 2015-10-20 DIAGNOSIS — D696 Thrombocytopenia, unspecified: Secondary | ICD-10-CM | POA: Diagnosis present

## 2015-10-20 DIAGNOSIS — D72819 Decreased white blood cell count, unspecified: Secondary | ICD-10-CM

## 2015-10-20 LAB — CBC WITH DIFFERENTIAL/PLATELET
Basophils Absolute: 0 10*3/uL (ref 0–0.1)
Basophils Relative: 1 %
EOS ABS: 0.1 10*3/uL (ref 0–0.7)
EOS PCT: 5 %
HCT: 34.7 % — ABNORMAL LOW (ref 35.0–47.0)
Hemoglobin: 11.5 g/dL — ABNORMAL LOW (ref 12.0–16.0)
LYMPHS ABS: 1.2 10*3/uL (ref 1.0–3.6)
Lymphocytes Relative: 41 %
MCH: 29.2 pg (ref 26.0–34.0)
MCHC: 33.1 g/dL (ref 32.0–36.0)
MCV: 88.4 fL (ref 80.0–100.0)
MONOS PCT: 10 %
Monocytes Absolute: 0.3 10*3/uL (ref 0.2–0.9)
Neutro Abs: 1.3 10*3/uL — ABNORMAL LOW (ref 1.4–6.5)
Neutrophils Relative %: 43 %
PLATELETS: 114 10*3/uL — AB (ref 150–440)
RBC: 3.93 MIL/uL (ref 3.80–5.20)
RDW: 12.7 % (ref 11.5–14.5)
WBC: 2.9 10*3/uL — ABNORMAL LOW (ref 3.6–11.0)

## 2015-10-20 LAB — VITAMIN B12: Vitamin B-12: 254 pg/mL (ref 180–914)

## 2015-10-20 LAB — FERRITIN: FERRITIN: 125 ng/mL (ref 11–307)

## 2015-10-21 LAB — FOLATE RBC
FOLATE, RBC: 987 ng/mL (ref >498)
Folate, Hemolysate: 336.5 ng/mL
Hematocrit: 34.1 % (ref 34.0–46.6)

## 2015-11-04 ENCOUNTER — Emergency Department: Payer: Medicare PPO

## 2015-11-04 ENCOUNTER — Telehealth: Payer: Self-pay

## 2015-11-04 ENCOUNTER — Encounter: Payer: Self-pay | Admitting: Emergency Medicine

## 2015-11-04 ENCOUNTER — Emergency Department
Admission: EM | Admit: 2015-11-04 | Discharge: 2015-11-04 | Disposition: A | Discharge status: Home / Self Care | Payer: Medicare PPO | Attending: Emergency Medicine | Admitting: Emergency Medicine

## 2015-11-04 DIAGNOSIS — M79602 Pain in left arm: Secondary | ICD-10-CM | POA: Insufficient documentation

## 2015-11-04 DIAGNOSIS — Z791 Long term (current) use of non-steroidal anti-inflammatories (NSAID): Secondary | ICD-10-CM | POA: Diagnosis not present

## 2015-11-04 DIAGNOSIS — E119 Type 2 diabetes mellitus without complications: Secondary | ICD-10-CM | POA: Diagnosis not present

## 2015-11-04 DIAGNOSIS — I10 Essential (primary) hypertension: Secondary | ICD-10-CM | POA: Diagnosis not present

## 2015-11-04 DIAGNOSIS — R0789 Other chest pain: Secondary | ICD-10-CM | POA: Insufficient documentation

## 2015-11-04 DIAGNOSIS — R6884 Jaw pain: Secondary | ICD-10-CM

## 2015-11-04 DIAGNOSIS — Z7982 Long term (current) use of aspirin: Secondary | ICD-10-CM | POA: Insufficient documentation

## 2015-11-04 DIAGNOSIS — Z7984 Long term (current) use of oral hypoglycemic drugs: Secondary | ICD-10-CM | POA: Insufficient documentation

## 2015-11-04 DIAGNOSIS — Z79899 Other long term (current) drug therapy: Secondary | ICD-10-CM | POA: Insufficient documentation

## 2015-11-04 LAB — CBC
HCT: 35.5 % (ref 35.0–47.0)
Hemoglobin: 11.5 g/dL — ABNORMAL LOW (ref 12.0–16.0)
MCH: 28.9 pg (ref 26.0–34.0)
MCHC: 32.4 g/dL (ref 32.0–36.0)
MCV: 89.2 fL (ref 80.0–100.0)
PLATELETS: 110 10*3/uL — AB (ref 150–440)
RBC: 3.99 MIL/uL (ref 3.80–5.20)
RDW: 13.1 % (ref 11.5–14.5)
WBC: 3.9 10*3/uL (ref 3.6–11.0)

## 2015-11-04 LAB — TROPONIN I
Troponin I: <0.03 ng/mL (ref <0.031)
Troponin I: <0.03 ng/mL (ref <0.031)

## 2015-11-04 LAB — BASIC METABOLIC PANEL
ANION GAP: 7 (ref 5–15)
BUN: 20 mg/dL (ref 6–20)
CALCIUM: 9.4 mg/dL (ref 8.9–10.3)
CO2: 29 mmol/L (ref 22–32)
CREATININE: 0.73 mg/dL (ref 0.44–1.00)
Chloride: 101 mmol/L (ref 101–111)
GFR calc Af Amer: >60 mL/min (ref >60)
GFR calc non Af Amer: >60 mL/min (ref >60)
Glucose, Bld: 226 mg/dL — ABNORMAL HIGH (ref 65–99)
Potassium: 3.9 mmol/L (ref 3.5–5.1)
SODIUM: 137 mmol/L (ref 135–145)

## 2015-11-04 NOTE — ED Notes (Signed)
Also has had jaw pain, , chest heaviness comes and goes

## 2015-11-04 NOTE — Discharge Instructions (Signed)
Please seek medical attention for any high fevers, chest pain, shortness of breath, change in behavior, persistent vomiting, bloody stool or any other new or concerning symptoms. ° ° °Pain Without a Known Cause °WHAT IS PAIN WITHOUT A KNOWN CAUSE? °Pain can occur in any part of the body and can range from mild to severe. Sometimes no cause can be found for why you are having pain. Some types of pain that can occur without a known cause include:  °· Headache. °· Back pain. °· Abdominal pain. °· Neck pain. °HOW IS PAIN WITHOUT A KNOWN CAUSE DIAGNOSED?  °Your health care provider will try to find the cause of your pain. This may include: °· Physical exam. °· Medical history. °· Blood tests. °· Urine tests. °· X-rays. °If no cause is found, your health care provider may diagnose you with pain without a known cause.  °IS THERE TREATMENT FOR PAIN WITHOUT A CAUSE?  °Treatment depends on the kind of pain you have. Your health care provider may prescribe medicines to help relieve your pain.  °WHAT CAN I DO AT HOME FOR MY PAIN?  °· Take medicines only as directed by your health care provider. °· Stop any activities that cause pain. During periods of severe pain, bed rest may help. °· Try to reduce your stress with activities such as yoga or meditation. Talk to your health care provider for other stress-reducing activity recommendations. °· Exercise regularly, if approved by your health care provider. °· Eat a healthy diet that includes fruits and vegetables. This may improve pain. Talk to your health care provider if you have any questions about your diet. °WHAT IF MY PAIN DOES NOT GET BETTER?  °If you have a painful condition and no reason can be found for the pain or the pain gets worse, it is important to follow up with your health care provider. It may be necessary to repeat tests and look further for a possible cause.  °  °This information is not intended to replace advice given to you by your health care provider. Make  sure you discuss any questions you have with your health care provider. °  °Document Released: 07/19/2001 Document Revised: 11/14/2014 Document Reviewed: 03/11/2014 °Elsevier Interactive Patient Education ©2016 [employer] ° °

## 2015-11-04 NOTE — ED Notes (Signed)
MD at bedside. 

## 2015-11-04 NOTE — ED Notes (Signed)
Patient transported to X-ray 

## 2015-11-04 NOTE — Telephone Encounter (Signed)
Pt called c/o general malaise, arm pain, jaw pain, increased SOB and sweating. Pt denies chest pain. Advised pt to go to ED for possible MI. Pt agrees to have sister drive her to ED. [name], CMA

## 2015-11-04 NOTE — ED Provider Notes (Signed)
[facility] Emergency Department Provider Note    ____________________________________________  Time seen: 1250  I have reviewed the triage vital signs and the nursing notes.   HISTORY  Chief Complaint Jaw Pain and Arm Pain   History limited by: Not Limited   HPI Bethany [name] is a 69 y.o. female who presents to the emergency department today with main complaints of jaw and left arm pain. The patient states she first started having left jaw pain 3 days ago. It is intermittent. She has not is anything that makes it better or worse. She states that she has also had pain in her left arm and heaviness to her chest. She states that she has not noticed any associated shortness of breath. She denies similar symptoms in the past. Denies fevers.    Past Medical History  Diagnosis Date  . Diabetes ([place])   . Hypertension   . Hyperlipidemia   . Reflux   . Diverticulosis   . Thrombocytopenia ([place])   . CHF (congestive heart failure) ([place])   . LV dysfunction   . VHD (valvular heart disease)   . Dilated cardiomyopathy ([place])   . Thyroid disease   . Depression   . GERD (gastroesophageal reflux disease)   . Anxiety     Patient Active Problem List   Diagnosis Date Noted  . Foot pain 10/09/2015  . GERD (gastroesophageal reflux disease) 08/24/2015  . Automatic implantable cardioverter-defibrillator in situ 07/29/2015  . Hypotension 06/18/2015  . Chest pain on exertion 06/18/2015  . Benign essential HTN 03/20/2015  . Absolute anemia 03/11/2015  . Abnormal liver enzymes 03/11/2015  . Decreased potassium in the blood 03/11/2015  . Hypercholesteremia 03/11/2015  . Adult hypothyroidism 03/11/2015  . Idiopathic insomnia 03/11/2015  . OP (osteoporosis) 03/11/2015  . Diabetes mellitus, type 2 ([place]) 03/11/2015  . NICM (nonischemic cardiomyopathy) ([place]) 02/11/2015  . LBBB (left bundle branch block) 02/11/2015  . Paroxysmal supraventricular tachycardia ([place])  11/25/2014  . Chronic systolic heart failure ([place]) 09/25/2014  . Heart valve disease 04/10/2014    Past Surgical History  Procedure Laterality Date  . Cardiac catheterization    . Cholecystectomy    . Partial hysterectomy    . Bone marrow biopsy    . Bi-ventricular implantable cardioverter defibrillator [name] 02/11/2015    Procedure: BI-VENTRICULAR IMPLANTABLE CARDIOVERTER DEFIBRILLATOR  (CRT-D);  Surgeon: Deboraha Sprang, MD;  Location[facility] CATH LAB;  Service: Cardiovascular;  Laterality: [name];  . Lead revision [name] 02/11/2015    Procedure: LEAD REVISION;  Surgeon: Deboraha Sprang, MD;  Location: [facility] CATH LAB;  Service: Cardiovascular;  Laterality: [name];  . Insert / replace / remove pacemaker    . Tooth extraction  09/23/2015    Current Outpatient Rx  Name  Route  Sig  Dispense  Refill  . acetaminophen (TYLENOL) 500 MG tablet   Oral   Take 1,000 mg by mouth 2 (two) times daily as needed for mild pain or moderate pain.         Marland Kitchen ALPRAZolam (XANAX) 0.5 MG tablet   Oral   Take 1 tablet (0.5 mg total) by mouth at bedtime as needed for anxiety.   30 tablet   0   . aspirin EC 81 MG tablet   Oral   Take 81 mg by mouth daily.         . carvedilol (COREG) 6.25 MG tablet   Oral   Take 1 tablet by mouth 2 (two) times daily.         Marland Kitchen  Cetylpyridinium Chloride (ANTISEPTIC ORAL RINSE MT)   Mouth/Throat   Use as directed 1 Dose in the mouth or throat 2 (two) times daily.         . ferrous sulfate 325 (65 FE) MG tablet   Oral   Take 325 mg by mouth 2 (two) times daily with a meal.         . losartan (COZAAR) 25 MG tablet   Oral   Take 1 tablet (25 mg total) by mouth daily.   90 tablet   1   . metFORMIN (GLUCOPHAGE) 500 MG tablet      TAKE 1 TABLET TWICE DAILY   180 tablet   3   . pantoprazole (PROTONIX) 40 MG tablet      TAKE 1 TABLET EVERY DAY   90 tablet   3   . senna (SENOKOT) 8.6 MG TABS tablet   Oral   Take 1 tablet by mouth daily as needed for mild  constipation.         Marland Kitchen spironolactone (ALDACTONE) 25 MG tablet   Oral   Take 1 tablet (25 mg total) by mouth daily.   30 tablet   3   . SYNTHROID 50 MCG tablet      TAKE 1 TABLET EVERY DAY   90 tablet   3   . EXPIRED: furosemide (LASIX) 20 MG tablet   Oral   Take 1 tablet by mouth 2 (two) times daily.          . meloxicam (MOBIC) 15 MG tablet   Oral   Take 1 tablet (15 mg total) by mouth daily.   30 tablet   0     Allergies Other and Sulfa antibiotics  Family History  Problem Relation Age of Onset  . Heart disease Mother   . Heart attack Mother   . Prostate cancer Father   . Ulcers Father   . Stroke Sister   . Heart attack Sister   . Diabetes Sister   . Heart attack Brother   . Stroke Brother     Social History Social History  Substance Use Topics  . Smoking status: Never Smoker   . Smokeless tobacco: Never Used  . Alcohol Use: No    Review of Systems  Constitutional: Negative for fever. Cardiovascular: Positive for chest heaviness.  Respiratory: Negative for shortness of breath. Gastrointestinal: Negative for abdominal pain, vomiting and diarrhea. Neurological: Negative for headaches, focal weakness or numbness.  10-point ROS otherwise negative.  ____________________________________________   PHYSICAL EXAM:  VITAL SIGNS: ED Triage Vitals  Enc Vitals Group     BP 11/04/15 0929 127/75 mmHg     Pulse Rate 11/04/15 0929 75     Resp 11/04/15 0929 16     Temp 11/04/15 0929 97.4 F (36.3 C)     Temp Source 11/04/15 0929 Oral     SpO2 11/04/15 0929 99 %     Weight 11/04/15 0929 120 lb (54.432 kg)     Height 11/04/15 0929 5' 1" (1.549 m)   Constitutional: Alert and oriented. Well appearing and in no distress. Eyes: Conjunctivae are normal. PERRL. Normal extraocular movements. ENT   Head: Normocephalic and atraumatic.   Nose: No congestion/rhinnorhea.   Mouth/Throat: Mucous membranes are moist.   Neck: No  stridor. Hematological/Lymphatic/Immunilogical: No cervical lymphadenopathy. Cardiovascular: Normal rate, regular rhythm.  No murmurs, rubs, or gallops. Respiratory: Normal respiratory effort without tachypnea nor retractions. Breath sounds are clear and equal bilaterally. No wheezes/rales/rhonchi. Gastrointestinal: Soft  and nontender. No distention.  Genitourinary: Deferred Musculoskeletal: Normal range of motion in all extremities. No joint effusions.  No lower extremity tenderness nor edema. Neurologic:  Normal speech and language. No gross focal neurologic deficits are appreciated.  Skin:  Skin is warm, dry and intact. No rash noted. Psychiatric: Mood and affect are normal. Speech and behavior are normal. Patient exhibits appropriate insight and judgment.  ____________________________________________    LABS (pertinent positives/negatives)  Labs Reviewed  BASIC METABOLIC PANEL - Abnormal; Notable for the following:    Glucose, Bld 226 (*)    All other components within normal limits  CBC - Abnormal; Notable for the following:    Hemoglobin 11.5 (*)    Platelets 110 (*)    All other components within normal limits  TROPONIN I     ____________________________________________   EKG  I, Nance Pear, attending physician, personally viewed and interpreted this EKG  EKG Time: 0923 Rate: 70 Rhythm: atrial sensed ventricular paced rhythm Axis: left axis deviation Intervals: qtc 492 QRS: paced rhythm ST changes: no st elevation Impression: abnormal ekg ____________________________________________    RADIOLOGY  CXR  IMPRESSION: No evidence of acute cardiopulmonary disease.  ____________________________________________   PROCEDURES  Procedure(s) performed: None  Critical Care performed: No  ____________________________________________   INITIAL IMPRESSION / ASSESSMENT AND PLAN / ED COURSE  Pertinent labs & imaging results that were available during my  care of the patient were reviewed by me and considered in my medical decision making (see chart for details).  Patient here because of left jaw and arm pain. Two sets of troponin where both negative. Unclear etiology of the pain at this point. CXR also negative. At this point doubt ACS. Will have patient follow up with PCP and cardiology.   ____________________________________________   FINAL CLINICAL IMPRESSION(S) / ED DIAGNOSES  Final diagnoses:  Jaw pain  Pain of left upper extremity     Nance Pear, MD 11/04/15 1516

## 2015-11-04 NOTE — ED Notes (Signed)
Pt reports left jaw, left neck and left arm pain intermittently x3 days; reports hx of CHF and ICD. Pt denies any pain at present.

## 2015-11-09 ENCOUNTER — Other Ambulatory Visit: Payer: Self-pay | Admitting: Family Medicine

## 2015-11-11 ENCOUNTER — Ambulatory Visit
Admission: RE | Admit: 2015-11-11 | Discharge: 2015-11-11 | Disposition: A | Discharge status: Home / Self Care | Payer: Medicare PPO | Source: Ambulatory Visit | Attending: Family Medicine | Admitting: Family Medicine

## 2015-11-11 ENCOUNTER — Encounter: Payer: Self-pay | Admitting: Family Medicine

## 2015-11-11 ENCOUNTER — Ambulatory Visit (INDEPENDENT_AMBULATORY_CARE_PROVIDER_SITE_OTHER): Payer: Medicare PPO | Admitting: Family Medicine

## 2015-11-11 ENCOUNTER — Telehealth: Payer: Self-pay

## 2015-11-11 VITALS — BP 112/62 | HR 76 | Temp 98.2°F | Resp 16 | Wt 124.0 lb

## 2015-11-11 DIAGNOSIS — E119 Type 2 diabetes mellitus without complications: Secondary | ICD-10-CM

## 2015-11-11 DIAGNOSIS — E7800 Pure hypercholesterolemia, unspecified: Secondary | ICD-10-CM

## 2015-11-11 DIAGNOSIS — M545 Low back pain: Secondary | ICD-10-CM

## 2015-11-11 DIAGNOSIS — M5136 Other intervertebral disc degeneration, lumbar region: Secondary | ICD-10-CM | POA: Insufficient documentation

## 2015-11-11 DIAGNOSIS — I10 Essential (primary) hypertension: Secondary | ICD-10-CM | POA: Diagnosis not present

## 2015-11-11 DIAGNOSIS — M791 Myalgia, unspecified site: Secondary | ICD-10-CM

## 2015-11-11 DIAGNOSIS — E039 Hypothyroidism, unspecified: Secondary | ICD-10-CM | POA: Diagnosis not present

## 2015-11-11 DIAGNOSIS — J069 Acute upper respiratory infection, unspecified: Secondary | ICD-10-CM | POA: Diagnosis not present

## 2015-11-11 DIAGNOSIS — D649 Anemia, unspecified: Secondary | ICD-10-CM | POA: Diagnosis not present

## 2015-11-11 LAB — POCT GLYCOSYLATED HEMOGLOBIN (HGB A1C): HEMOGLOBIN A1C: 7.3

## 2015-11-11 NOTE — Telephone Encounter (Signed)
-----   Message from [name], MD sent at 11/11/2015 10:23 AM EST ----- Does have arthritis.   No other abnormalities.  Please notify patient. Thanks.

## 2015-11-11 NOTE — Progress Notes (Signed)
Patient ID: Bethany [name], female   DOB: 1946/09/30, 70 y.o.   MRN: [id number]         Patient: Bethany [name] Female    DOB: 01-Jan-1946   70 y.o.   MRN: [id number] Visit Date: 11/11/2015  Today's Provider: [name], MD   Chief Complaint  Patient presents with  . Hypertension  . Hyperlipidemia  . Diabetes   Subjective:    Hypertension This is a chronic problem. The problem is unchanged. The problem is controlled (BP runs in the low-mid 100's over 60/70's). Associated symptoms include headaches and neck pain. Pertinent negatives include no chest pain, malaise/fatigue, palpitations or shortness of breath. Risk factors for coronary artery disease include diabetes mellitus and dyslipidemia. There are no compliance problems.   Hyperlipidemia This is a chronic problem. The problem is controlled. Recent lipid tests were reviewed and are normal. Associated symptoms include myalgias. Pertinent negatives include no chest pain or shortness of breath. Current antihyperlipidemic treatment includes statins. There are no compliance problems.  Risk factors for coronary artery disease include diabetes mellitus, dyslipidemia and hypertension.  Diabetes She presents for her follow-up diabetic visit. She has type 2 diabetes mellitus. Her disease course has been stable. Hypoglycemia symptoms include headaches. Pertinent negatives for hypoglycemia include no dizziness. Associated symptoms include fatigue. Pertinent negatives for diabetes include no chest pain, no foot paresthesias, no foot ulcerations, no polydipsia, no polyphagia, no polyuria, no visual change and no weakness. Symptoms are stable. She is compliant with treatment all of the time. Her weight is stable. There is no change in her home blood glucose trend. Her breakfast blood glucose range is generally 90-110 mg/dl.  URI  This is a new problem. The current episode started in the past 7 days. The problem has been gradually improving. There has  been no fever. Associated symptoms include congestion, coughing, headaches, neck pain, a plugged ear sensation, rhinorrhea and a sore throat. Pertinent negatives include no abdominal pain, chest pain, diarrhea, ear pain, nausea, sneezing, vomiting or wheezing. She has tried nothing for the symptoms.  Back Pain This is a new problem. The current episode started 1 to 4 weeks ago. The problem occurs constantly. The problem has been gradually improving since onset. The pain is present in the lumbar spine. The quality of the pain is described as cramping and shooting. The pain radiates to the left thigh. Associated symptoms include headaches. Pertinent negatives include no abdominal pain, bladder incontinence, bowel incontinence, chest pain, fever, numbness, paresis, paresthesias or weakness. She has tried muscle relaxant for the symptoms. The treatment provided moderate relief.     Lab Results  Component Value Date   CHOL 130 02/21/2014   HDL 42 02/21/2014   LDLCALC 69 02/21/2014   TRIG 94 02/21/2014   Lab Results  Component Value Date   HGBA1C 6.5 08/14/2015       Allergies  Allergen Reactions  . Other Other (See Comments)    NO BLOOD PRODUCTS- JEHOVAHS WITNESS  . Sulfa Antibiotics Other (See Comments)    Loss of taste   Previous Medications   ACETAMINOPHEN (TYLENOL) 500 MG TABLET    Take 1,000 mg by mouth 2 (two) times daily as needed for mild pain or moderate pain.   ALCOHOL SWABS (B-D SINGLE USE SWABS REGULAR) PADS    USE  TO CHECK BLOOD SUGAR ONE TIME DAILY   ALPRAZOLAM (XANAX) 0.5 MG TABLET    Take 1 tablet (0.5 mg total) by mouth at bedtime as needed for  anxiety.   ASPIRIN EC 81 MG TABLET    Take 81 mg by mouth daily.   CARVEDILOL (COREG) 6.25 MG TABLET    Take 1 tablet by mouth 2 (two) times daily.   CETYLPYRIDINIUM CHLORIDE (ANTISEPTIC ORAL RINSE MT)    Use as directed 1 Dose in the mouth or throat 2 (two) times daily.   FERROUS SULFATE 325 (65 FE) MG TABLET    Take 325 mg by  mouth 2 (two) times daily with a meal.   FUROSEMIDE (LASIX) 20 MG TABLET    Take 1 tablet by mouth 2 (two) times daily.    LOSARTAN (COZAAR) 25 MG TABLET    Take 1 tablet (25 mg total) by mouth daily.   MELOXICAM (MOBIC) 15 MG TABLET    Take 1 tablet (15 mg total) by mouth daily.   METFORMIN (GLUCOPHAGE) 500 MG TABLET    TAKE 1 TABLET TWICE DAILY   PANTOPRAZOLE (PROTONIX) 40 MG TABLET    TAKE 1 TABLET EVERY DAY   SENNA (SENOKOT) 8.6 MG TABS TABLET    Take 1 tablet by mouth daily as needed for mild constipation.   SPIRONOLACTONE (ALDACTONE) 25 MG TABLET    Take 1 tablet (25 mg total) by mouth daily.   SYNTHROID 50 MCG TABLET    TAKE 1 TABLET EVERY DAY    Review of Systems  Constitutional: Positive for chills and fatigue. Negative for fever, malaise/fatigue, diaphoresis, activity change, appetite change and unexpected weight change.  HENT: Positive for congestion, postnasal drip, rhinorrhea, sore throat and voice change. Negative for ear discharge, ear pain, hearing loss, mouth sores, nosebleeds, sneezing, tinnitus and trouble swallowing.   Eyes: Negative for photophobia, pain, discharge, redness, itching and visual disturbance.  Respiratory: Positive for cough. Negative for apnea, choking, chest tightness, shortness of breath, wheezing and stridor.   Cardiovascular: Positive for leg swelling (Occasional). Negative for chest pain and palpitations.  Gastrointestinal: Positive for constipation. Negative for nausea, vomiting, abdominal pain, diarrhea, blood in stool, abdominal distention, anal bleeding, rectal pain and bowel incontinence.  Endocrine: Negative for polydipsia, polyphagia and polyuria.  Genitourinary: Negative for bladder incontinence.  Musculoskeletal: Positive for myalgias, arthralgias (lower back pain) and neck pain. Negative for back pain, joint swelling, gait problem and neck stiffness.  Neurological: Positive for headaches. Negative for dizziness, weakness, light-headedness,  numbness and paresthesias.      Social History  Substance Use Topics  . Smoking status: Never Smoker   . Smokeless tobacco: Never Used  . Alcohol Use: No   Objective:   BP 112/62 mmHg  Pulse 76  Temp(Src) 98.2 F (36.8 C) (Oral)  Resp 16  Wt 124 lb (56.246 kg)  LMP  (LMP Unknown)  Physical Exam  Constitutional: She appears well-developed and well-nourished.  HENT:  Head: Normocephalic and atraumatic.  Right Ear: Tympanic membrane, external ear and ear canal normal.  Left Ear: Tympanic membrane, external ear and ear canal normal.  Nose: Nose normal.  Mouth/Throat: Uvula is midline, oropharynx is clear and moist and mucous membranes are normal.  Cardiovascular: Normal rate, regular rhythm and normal heart sounds.   Pulmonary/Chest: Effort normal and breath sounds normal.  Musculoskeletal:       Lumbar back: She exhibits tenderness (Over right sided SI Joint. ).  Psychiatric: She has a normal mood and affect. Her behavior is normal. Judgment and thought content normal.   Results for orders placed or performed in visit on 11/11/15  POCT glycosylated hemoglobin (Hb A1C)  Result Value Ref  Range   Hemoglobin A1C 7.3    Diabetic Foot Exam - Simple   Simple Foot Form  Diabetic Foot exam was performed with the following findings:  Yes 11/11/2015  9:17 AM  Visual Inspection  No deformities, no ulcerations, no other skin breakdown bilaterally:  Yes  Sensation Testing  Intact to touch and monofilament testing bilaterally:  Yes  Pulse Check  Posterior Tibialis and Dorsalis pulse intact bilaterally:  Yes  Comments          Assessment & Plan:      1. Benign essential HTN Stable, continue current medication.  2. Type 2 diabetes mellitus without complication, without long-term current use of insulin (HCC) A1C slightly elevated at 7.3%.  No changes in medications right now, pt will work on lifestyle changes first.  Recheck in three months.   - POCT glycosylated hemoglobin (Hb  A1C) - Comprehensive metabolic panel - Ambulatory referral to Podiatry  3. Hypercholesteremia Will check labs.   - Lipid panel  4. Upper respiratory infection Suspect Viral;  Advised to call back if worsening (i.e. Fevers, Shortness of breath, Wheezing) or does not improve.    5. Right low back pain, with sciatica presence unspecified Has improved some from last week, but pt still has pain.  Advised to treat with tylenol.  Will get X-ray  - DG Lumbar Spine Complete  6. Hypothyroidism, unspecified hypothyroidism type Stable will check labs  - TSH  7. Anemia, unspecified anemia type Stable, check labs.    - CBC with Differential/Platelet  8. Myalgia Worsening, pt is concerned about low Magnesium, will check.    - Magnesium      Patient was seen and examined by Leo Grosser, MD, and note scribed by Kavin Leech, CMA.  I have reviewed the document for accuracy and completeness and I agree with above. - Leo Grosser, MD   [name], MD  [facility]

## 2015-11-11 NOTE — Telephone Encounter (Signed)
Pt advised.   Thanks,   -Jeffree Cazeau  

## 2015-11-14 LAB — COMPREHENSIVE METABOLIC PANEL
A/G RATIO: 1.3 (ref 1.1–2.5)
ALT: 9 IU/L (ref 0–32)
AST: 14 IU/L (ref 0–40)
Albumin: 3.9 g/dL (ref 3.6–4.8)
Alkaline Phosphatase: 72 IU/L (ref 39–117)
BUN/Creatinine Ratio: 13 (ref 11–26)
BUN: 10 mg/dL (ref 8–27)
Bilirubin Total: 0.3 mg/dL (ref 0.0–1.2)
CALCIUM: 9.4 mg/dL (ref 8.7–10.3)
CHLORIDE: 99 mmol/L (ref 96–106)
CO2: 27 mmol/L (ref 18–29)
Creatinine, Ser: 0.76 mg/dL (ref 0.57–1.00)
GFR, EST AFRICAN AMERICAN: 93 mL/min/{1.73_m2} (ref >59)
GFR, EST NON AFRICAN AMERICAN: 80 mL/min/{1.73_m2} (ref >59)
GLOBULIN, TOTAL: 3 g/dL (ref 1.5–4.5)
Glucose: 130 mg/dL — ABNORMAL HIGH (ref 65–99)
POTASSIUM: 4 mmol/L (ref 3.5–5.2)
SODIUM: 138 mmol/L (ref 134–144)
Total Protein: 6.9 g/dL (ref 6.0–8.5)

## 2015-11-14 LAB — LIPID PANEL
CHOL/HDL RATIO: 2.6 ratio (ref 0.0–4.4)
Cholesterol, Total: 190 mg/dL (ref 100–199)
HDL: 72 mg/dL (ref >39)
LDL Calculated: 103 mg/dL — ABNORMAL HIGH (ref 0–99)
Triglycerides: 74 mg/dL (ref 0–149)
VLDL Cholesterol Cal: 15 mg/dL (ref 5–40)

## 2015-11-14 LAB — CBC WITH DIFFERENTIAL/PLATELET
Basophils Absolute: 0 10*3/uL (ref 0.0–0.2)
Basos: 0 %
EOS (ABSOLUTE): 0.1 10*3/uL (ref 0.0–0.4)
EOS: 3 %
HEMATOCRIT: 32.9 % — AB (ref 34.0–46.6)
HEMOGLOBIN: 10.9 g/dL — AB (ref 11.1–15.9)
Immature Grans (Abs): 0 10*3/uL (ref 0.0–0.1)
Immature Granulocytes: 0 %
LYMPHS ABS: 1.3 10*3/uL (ref 0.7–3.1)
Lymphs: 36 %
MCH: 28.9 pg (ref 26.6–33.0)
MCHC: 33.1 g/dL (ref 31.5–35.7)
MCV: 87 fL (ref 79–97)
MONOS ABS: 0.5 10*3/uL (ref 0.1–0.9)
Monocytes: 14 %
Neutrophils Absolute: 1.6 10*3/uL (ref 1.4–7.0)
Neutrophils: 47 %
Platelets: 125 10*3/uL — ABNORMAL LOW (ref 150–379)
RBC: 3.77 x10E6/uL (ref 3.77–5.28)
RDW: 13 % (ref 12.3–15.4)
WBC: 3.5 10*3/uL (ref 3.4–10.8)

## 2015-11-14 LAB — TSH: TSH: 1.84 u[IU]/mL (ref 0.450–4.500)

## 2015-11-14 LAB — MAGNESIUM: MAGNESIUM: 1.7 mg/dL (ref 1.6–2.3)

## 2015-11-16 ENCOUNTER — Ambulatory Visit: Payer: Medicare PPO | Admitting: Family Medicine

## 2015-11-18 ENCOUNTER — Ambulatory Visit: Payer: Medicare PPO | Attending: Family | Admitting: Family

## 2015-11-18 ENCOUNTER — Encounter: Payer: Self-pay | Admitting: Family

## 2015-11-18 VITALS — BP 119/63 | HR 80 | Resp 20 | Ht 61.0 in | Wt 124.0 lb

## 2015-11-18 DIAGNOSIS — F329 Major depressive disorder, single episode, unspecified: Secondary | ICD-10-CM | POA: Diagnosis not present

## 2015-11-18 DIAGNOSIS — I5022 Chronic systolic (congestive) heart failure: Secondary | ICD-10-CM | POA: Diagnosis present

## 2015-11-18 DIAGNOSIS — E785 Hyperlipidemia, unspecified: Secondary | ICD-10-CM | POA: Diagnosis not present

## 2015-11-18 DIAGNOSIS — I110 Hypertensive heart disease with heart failure: Secondary | ICD-10-CM | POA: Insufficient documentation

## 2015-11-18 DIAGNOSIS — Z7982 Long term (current) use of aspirin: Secondary | ICD-10-CM | POA: Insufficient documentation

## 2015-11-18 DIAGNOSIS — I10 Essential (primary) hypertension: Secondary | ICD-10-CM

## 2015-11-18 DIAGNOSIS — I420 Dilated cardiomyopathy: Secondary | ICD-10-CM | POA: Diagnosis not present

## 2015-11-18 DIAGNOSIS — E079 Disorder of thyroid, unspecified: Secondary | ICD-10-CM | POA: Diagnosis not present

## 2015-11-18 DIAGNOSIS — R0602 Shortness of breath: Secondary | ICD-10-CM | POA: Insufficient documentation

## 2015-11-18 DIAGNOSIS — J069 Acute upper respiratory infection, unspecified: Secondary | ICD-10-CM | POA: Insufficient documentation

## 2015-11-18 DIAGNOSIS — E119 Type 2 diabetes mellitus without complications: Secondary | ICD-10-CM | POA: Insufficient documentation

## 2015-11-18 DIAGNOSIS — K219 Gastro-esophageal reflux disease without esophagitis: Secondary | ICD-10-CM | POA: Diagnosis not present

## 2015-11-18 DIAGNOSIS — Z79899 Other long term (current) drug therapy: Secondary | ICD-10-CM | POA: Diagnosis not present

## 2015-11-18 DIAGNOSIS — Z7984 Long term (current) use of oral hypoglycemic drugs: Secondary | ICD-10-CM | POA: Diagnosis not present

## 2015-11-18 MED ORDER — AZITHROMYCIN 250 MG PO TABS
ORAL_TABLET | ORAL | Status: DC
Start: 2015-11-18 — End: 2015-12-10

## 2015-11-18 NOTE — Progress Notes (Signed)
Subjective:    Patient ID: Bethany [name], female    DOB: 02-07-1946, 70 y.o.   MRN: [id number]  Congestive Heart Failure Presents for follow-up visit. The disease [name] has been stable. Associated symptoms include chest pain (when coughing), fatigue, muscle weakness (in legs), palpitations and shortness of breath. Pertinent negatives include no abdominal pain or edema. The symptoms have been stable. Past treatments include beta blockers, angiotensin receptor blockers, aldosterone receptor blockers and salt and fluid restriction. The treatment provided moderate relief. Compliance with prior treatments has been good. Her past medical history is significant for DM, HTN and valvular heart disease. She has multiple 1st degree relatives with heart disease. Compliance with total regimen is 76-100%.  Chest Pain  This is a recurrent problem. The current episode started today. The onset quality is sudden. The problem occurs intermittently. The problem has been unchanged. The pain is present in the substernal region. The pain is at a severity of 3/10. The pain is mild. The quality of the pain is described as dull. The pain does not radiate. Associated symptoms include a cough, headaches (back of head), malaise/fatigue, palpitations, shortness of breath and weakness (in legs). Pertinent negatives include no abdominal pain, back pain, dizziness, fever, irregular heartbeat, nausea, syncope or vomiting. The cough has no precipitants. The cough is non-productive. Nothing relieves the cough. Nothing worsens the cough. The pain is aggravated by coughing and deep breathing. She has tried acetaminophen for the symptoms. The treatment provided no relief. Risk factors include post-menopausal.  Her past medical history is significant for anxiety/panic attacks, CHF, diabetes, DM, hyperlipidemia, HTN, hypertension, muscle weakness (in legs) and thyroid problem.  Her family medical history is significant for diabetes, heart  disease and stroke. Prior diagnostic workup includes cardiac catherization and echocardiogram.  Cough This is a recurrent problem. The current episode started 1 to 4 weeks ago. The problem has been unchanged. The problem occurs every few hours. The cough is non-productive. Associated symptoms include chest pain (when coughing), chills, headaches (back of head), postnasal drip, rhinorrhea, a sore throat, shortness of breath and wheezing (when coughing). Pertinent negatives include no ear pain, fever or rash. Nothing aggravates the symptoms. She has tried rest for the symptoms. The treatment provided no relief. There is no history of asthma or COPD.    Past Medical History  Diagnosis Date  . Diabetes ([place])   . Hypertension   . Hyperlipidemia   . Reflux   . Diverticulosis   . Thrombocytopenia ([place])   . CHF (congestive heart failure) ([place])   . LV dysfunction   . VHD (valvular heart disease)   . Dilated cardiomyopathy ([place])   . Thyroid disease   . Depression   . GERD (gastroesophageal reflux disease)   . Anxiety     Past Surgical History  Procedure Laterality Date  . Cardiac catheterization    . Cholecystectomy    . Partial hysterectomy    . Bone marrow biopsy    . Bi-ventricular implantable cardioverter defibrillator [name] 02/11/2015    Procedure: BI-VENTRICULAR IMPLANTABLE CARDIOVERTER DEFIBRILLATOR  (CRT-D);  Surgeon: Deboraha Sprang, MD;  Location: [facility] CATH LAB;  Service: Cardiovascular;  Laterality: [name];  . Lead revision [name] 02/11/2015    Procedure: LEAD REVISION;  Surgeon: Deboraha Sprang, MD;  Location: [facility] CATH LAB;  Service: Cardiovascular;  Laterality: [name];  . Insert / replace / remove pacemaker    . Tooth extraction  09/23/2015    Family History  Problem Relation Age of Onset  .  Heart disease Mother   . Heart attack Mother   . Prostate cancer Father   . Ulcers Father   . Stroke Sister   . Heart attack Sister   . Diabetes Sister   . Heart attack Brother   . Stroke Brother      Social History  Substance Use Topics  . Smoking status: Never Smoker   . Smokeless tobacco: Never Used  . Alcohol Use: No    Allergies  Allergen Reactions  . Other Other (See Comments)    NO BLOOD PRODUCTS- JEHOVAHS WITNESS  . Sulfa Antibiotics Other (See Comments)    Loss of taste    Prior to Admission medications   Medication Sig Start Date End Date Taking? Authorizing Provider  acetaminophen (TYLENOL) 500 MG tablet Take 1,000 mg by mouth 2 (two) times daily as needed for mild pain or moderate pain.   Yes Historical Provider, MD  Alcohol Swabs (B-D SINGLE USE SWABS REGULAR) PADS USE  TO CHECK BLOOD SUGAR ONE TIME DAILY 11/09/15  Yes Margarita Rana, MD  ALPRAZolam Duanne Moron) 0.5 MG tablet Take 1 tablet (0.5 mg total) by mouth at bedtime as needed for anxiety. 05/13/15  Yes Margarita Rana, MD  aspirin EC 81 MG tablet Take 81 mg by mouth daily.   Yes Historical Provider, MD  carvedilol (COREG) 6.25 MG tablet Take 1 tablet by mouth 2 (two) times daily. 03/26/15  Yes Historical Provider, MD  Cetylpyridinium Chloride (ANTISEPTIC ORAL RINSE MT) Use as directed 1 Dose in the mouth or throat 2 (two) times daily.   Yes Historical Provider, MD  ferrous sulfate 325 (65 FE) MG tablet Take 325 mg by mouth 2 (two) times daily with a meal.   Yes Historical Provider, MD  furosemide (LASIX) 20 MG tablet Take 20 mg by mouth daily.   Yes Historical Provider, MD  losartan (COZAAR) 25 MG tablet Take 1 tablet (25 mg total) by mouth daily. 03/20/15  Yes Deboraha Sprang, MD  meloxicam (MOBIC) 15 MG tablet Take 1 tablet (15 mg total) by mouth daily. 09/11/15  Yes Margarita Rana, MD  metFORMIN (GLUCOPHAGE) 500 MG tablet TAKE 1 TABLET TWICE DAILY 05/27/15  Yes Margarita Rana, MD  pantoprazole (PROTONIX) 40 MG tablet TAKE 1 TABLET EVERY DAY 08/24/15  Yes Margarita Rana, MD  senna (SENOKOT) 8.6 MG TABS tablet Take 1 tablet by mouth daily as needed for mild constipation.   Yes Historical Provider, MD  spironolactone  (ALDACTONE) 25 MG tablet Take 1 tablet (25 mg total) by mouth daily. 09/18/15  Yes Alisa Graff, FNP  SYNTHROID 50 MCG tablet TAKE 1 TABLET EVERY DAY 05/27/15  Yes Margarita Rana, MD  azithromycin (ZITHROMAX Z-PAK) 250 MG tablet 2 tablets today and then 1 tablet daily for the next 4 days 11/18/15   Alisa Graff, FNP     Review of Systems  Constitutional: Positive for chills, malaise/fatigue and fatigue. Negative for fever and appetite change.  HENT: Positive for postnasal drip, rhinorrhea and sore throat. Negative for congestion and ear pain.   Eyes: Negative.   Respiratory: Positive for cough, shortness of breath and wheezing (when coughing). Negative for chest tightness.   Cardiovascular: Positive for chest pain (when coughing) and palpitations. Negative for leg swelling and syncope.  Gastrointestinal: Positive for abdominal distention (minimal). Negative for nausea, vomiting and abdominal pain.  Endocrine: Negative.   Genitourinary: Negative.   Musculoskeletal: Positive for muscle weakness (in legs). Negative for back pain and neck pain.  Skin: Negative for  rash.  Allergic/Immunologic: Negative.   Neurological: Positive for weakness (in legs), light-headedness and headaches (back of head). Negative for dizziness.  Hematological: Negative for adenopathy. Does not bruise/bleed easily.  Psychiatric/Behavioral: Positive for sleep disturbance (chronically with trouble sleeping) and dysphoric mood. Negative for suicidal ideas. The patient is not nervous/anxious.        Objective:   Physical Exam  Constitutional: She is oriented to person, place, and time. She appears well-developed and well-nourished.  HENT:  Head: Normocephalic and atraumatic.  Eyes: Conjunctivae are normal. Pupils are equal, round, and reactive to light.  Neck: Normal range of motion. Neck supple.  Cardiovascular: Normal rate and regular rhythm.   Pulmonary/Chest: Effort normal. She has no wheezes. She has no rales.   Abdominal: Soft. She exhibits no distension. There is no tenderness.  Musculoskeletal: She exhibits no edema or tenderness.  Neurological: She is alert and oriented to person, place, and time.  Skin: Skin is warm and dry.  Psychiatric: She has a normal mood and affect. Her behavior is normal. Thought content normal.  Nursing note and vitals reviewed.   BP 119/63 mmHg  Pulse 80  Resp 20  Ht _0  (1.549 m)  Wt 124 lb (56.246 kg)  BMI 23.44 kg/m2  SpO2 100%  LMP  (LMP Unknown)       Assessment & Plan:  1: Chronic heart failure with reduced ejection fraction- Patient presents with fatigue and shortness of breath upon exertion. She says that her symptoms are worse but she attributes that to the respiratory illness that she has going on. She isn't sleeping well but admits to chronically not being able to sleep well. She continues to weigh herself and reports a stable weight without any overnight weight gains. Reminded to call for an overnight weight gain of >2 pounds or a weekly weight gain of >5 pounds. She is not adding salt to her foods but admits to probably eating saltier foods because she's been snowed in. [facility] Pharm[facility] went in and reviewed medications with her. 2: URI with cough- She saw her PCP about a week ago and says that her symptoms are no better and she feels worse. Does have a cough but says that it's not productive. Also had chest pain this morning when she coughed. She describes the chest pain as "no different than other times" she's had chest pain. Has tried OTC Tylenol sinus but without any relief. Since she's had symptoms for over a week and they are getting worse, will go ahead and treat with zpack as directed without refills. Can also take OTC mucinex to help break up the cough. Followup with her PCP should she not start to feel better.  3: HTN- Blood pressure looks good here. Continue medications.  She is to keep her already scheduled appointment on 12/17/15 or call sooner  for any questions/problems before then.

## 2015-11-18 NOTE — Patient Instructions (Signed)
Continue weighing daily and call for an overnight weight gain of > 2 pounds or a weekly weight gain of >5 pounds. 

## 2015-11-24 ENCOUNTER — Telehealth: Payer: Self-pay | Admitting: Cardiology

## 2015-11-24 ENCOUNTER — Ambulatory Visit (INDEPENDENT_AMBULATORY_CARE_PROVIDER_SITE_OTHER): Payer: Medicare PPO | Admitting: *Deleted

## 2015-11-24 DIAGNOSIS — I429 Cardiomyopathy, unspecified: Secondary | ICD-10-CM | POA: Diagnosis not present

## 2015-11-24 DIAGNOSIS — I5022 Chronic systolic (congestive) heart failure: Secondary | ICD-10-CM

## 2015-11-24 DIAGNOSIS — I428 Other cardiomyopathies: Secondary | ICD-10-CM

## 2015-11-24 NOTE — Telephone Encounter (Signed)
Spoke with pt and reminded pt of remote transmission that is due today. Pt verbalized understanding.   

## 2015-11-24 NOTE — Progress Notes (Signed)
Remote ICD transmission.   

## 2015-11-27 ENCOUNTER — Encounter: Payer: Self-pay | Admitting: Cardiology

## 2015-11-27 LAB — CUP PACEART REMOTE DEVICE CHECK
Battery Voltage: 3 V
Brady Statistic AP VS Percent: 0.02 %
Brady Statistic AS VP Percent: 98.54 %
Brady Statistic AS VS Percent: 1.37 %
Date Time Interrogation Session: 20170117200105
HighPow Impedance: 58 Ohm
Implantable Lead Implant Date: 20160409
Implantable Lead Location: 753859
Implantable Lead Model: 4598
Implantable Lead Model: 5076
Lead Channel Impedance Value: 475 Ohm
Lead Channel Impedance Value: 532 Ohm
Lead Channel Impedance Value: 551 Ohm
Lead Channel Impedance Value: 817 Ohm
Lead Channel Impedance Value: 893 Ohm
Lead Channel Impedance Value: 931 Ohm
Lead Channel Impedance Value: 931 Ohm
Lead Channel Pacing Threshold Amplitude: 0.375 V
Lead Channel Pacing Threshold Amplitude: 1.75 V
Lead Channel Pacing Threshold Pulse Width: 0.4 ms
Lead Channel Pacing Threshold Pulse Width: 0.8 ms
Lead Channel Sensing Intrinsic Amplitude: 1.875 mV
Lead Channel Sensing Intrinsic Amplitude: 1.875 mV
Lead Channel Sensing Intrinsic Amplitude: 11.875 mV
Lead Channel Setting Pacing Amplitude: 1.5 V
Lead Channel Setting Pacing Amplitude: 2.75 V
Lead Channel Setting Pacing Pulse Width: 0.8 ms
Lead Channel Setting Sensing Sensitivity: 0.3 mV
MDC IDC LEAD IMPLANT DT: 20160409
MDC IDC LEAD IMPLANT DT: 20160409
MDC IDC LEAD LOCATION: 753858
MDC IDC LEAD LOCATION: 753860
MDC IDC MSMT BATTERY REMAINING LONGEVITY: 73 mo
MDC IDC MSMT LEADCHNL LV IMPEDANCE VALUE: 475 Ohm
MDC IDC MSMT LEADCHNL LV IMPEDANCE VALUE: 646 Ohm
MDC IDC MSMT LEADCHNL LV IMPEDANCE VALUE: 893 Ohm
MDC IDC MSMT LEADCHNL LV IMPEDANCE VALUE: 950 Ohm
MDC IDC MSMT LEADCHNL RA IMPEDANCE VALUE: 456 Ohm
MDC IDC MSMT LEADCHNL RV IMPEDANCE VALUE: 361 Ohm
MDC IDC MSMT LEADCHNL RV PACING THRESHOLD AMPLITUDE: 0.75 V
MDC IDC MSMT LEADCHNL RV PACING THRESHOLD PULSEWIDTH: 0.4 ms
MDC IDC MSMT LEADCHNL RV SENSING INTR AMPL: 11.875 mV
MDC IDC STAT BRADY AP VP PERCENT: 0.06 %
MDC IDC STAT BRADY RA PERCENT PACED: 0.08 %
MDC IDC STAT BRADY RV PERCENT PACED: 13.76 %

## 2015-12-10 ENCOUNTER — Ambulatory Visit (INDEPENDENT_AMBULATORY_CARE_PROVIDER_SITE_OTHER): Payer: Medicare PPO | Admitting: Family Medicine

## 2015-12-10 ENCOUNTER — Encounter: Payer: Self-pay | Admitting: Family Medicine

## 2015-12-10 VITALS — BP 106/64 | HR 72 | Temp 98.0°F | Resp 16 | Wt 125.0 lb

## 2015-12-10 DIAGNOSIS — F5101 Primary insomnia: Secondary | ICD-10-CM | POA: Diagnosis not present

## 2015-12-10 DIAGNOSIS — R1084 Generalized abdominal pain: Secondary | ICD-10-CM | POA: Diagnosis not present

## 2015-12-10 DIAGNOSIS — E119 Type 2 diabetes mellitus without complications: Secondary | ICD-10-CM

## 2015-12-10 MED ORDER — PROBIOTIC DAILY PO CAPS: 1.0000 | ORAL_CAPSULE | Freq: Every day | ORAL | Status: DC

## 2015-12-10 MED ORDER — MIRTAZAPINE 15 MG PO TABS: 15.0000 mg | ORAL_TABLET | Freq: Every day | ORAL | Status: DC

## 2015-12-10 NOTE — Progress Notes (Signed)
Patient ID: Bethany [name], female   DOB: November 09, 1945, 70 y.o.   MRN: [id number]         Patient: Bethany [name] Female    DOB: [birth year]   70 y.o.   MRN: [id number] Visit Date: 12/10/2015  Today's Provider: [name], MD   No chief complaint on file.  Subjective:    Gastroesophageal Reflux She complains of a hoarse voice. She reports no abdominal pain (Pt reports she does not have abdominal pain; but says it does not "Feel Right" ), no belching, no chest pain, no choking, no coughing, no nausea, no sore throat or no wheezing. The current episode started 1 to 4 weeks ago (Started about three weeks ago. ). The problem occurs constantly. Associated symptoms include fatigue. She has tried nothing for the symptoms.   When did it start? About three weeks ago  Where is the pain located? Upper abdominal; into chest  right upper quadrant, right lower quadrant, left upper quadrant, left lower quadrant,  epigastric, perinaval, right flank, left flank  Does the pain travel to the back, shoulder, sternal area, pelvic region, or scrotum? no  Is the pain constant, intermittent, sharp, piercing, or a dull ache?  Pt says it is a constant "Full feeling"      Is the pain debilitating? no  Is the pain just a nuisance? yes  Does it disrupt the patient's sleep? no  How often does the pain occur? Daily  hourly, daily, weekly, monthly  Between episodes, is the patient pain free? no  Has the patient experienced similar pain before? yes    Is there a history of kidney stones? no  Has the patient had abdominal surgery in past (i.e., appendix, gastric bypass, uterus)? Yes  (Partial Hysterectomy)  Has the patient had a colonoscopy? yes      Is there any:  Fever? no  Nausea? no  Vomiting? no  Diarrhea? yes  Loss of appetite? yes  Constipation? yes  Change in bowel habits? yes  Blood in stool? no  Black stool (melena)? no  Pain on urination (dysuria)? no  Blood in urine (hematuria)? no  Does  the pain change with food, bowel movement, movement, or urination? no  Does the pain get worse with anxiety? Not sure        How much alcohol is consumed daily? no  Is there any substance abuse or withdrawal? no  Is the pain getting better, worse, or staying the same? About the same.          Allergies  Allergen Reactions  . Other Other (See Comments)    NO BLOOD PRODUCTS- JEHOVAHS WITNESS  . Sulfa Antibiotics Other (See Comments)    Loss of taste   Previous Medications   ACETAMINOPHEN (TYLENOL) 500 MG TABLET    Take 1,000 mg by mouth 2 (two) times daily as needed for mild pain or moderate pain.   ALCOHOL SWABS (B-D SINGLE USE SWABS REGULAR) PADS    USE  TO CHECK BLOOD SUGAR ONE TIME DAILY   ALPRAZOLAM (XANAX) 0.5 MG TABLET    Take 1 tablet (0.5 mg total) by mouth at bedtime as needed for anxiety.   ASPIRIN EC 81 MG TABLET    Take 81 mg by mouth daily.   CARVEDILOL (COREG) 6.25 MG TABLET    Take 1 tablet by mouth 2 (two) times daily.   CETYLPYRIDINIUM CHLORIDE (ANTISEPTIC ORAL RINSE MT)    Use as directed 1 Dose in the mouth or throat  2 (two) times daily.   FERROUS SULFATE 325 (65 FE) MG TABLET    Take 325 mg by mouth 2 (two) times daily with a meal.   FUROSEMIDE (LASIX) 20 MG TABLET    Take 20 mg by mouth daily.   LOSARTAN (COZAAR) 25 MG TABLET    Take 1 tablet (25 mg total) by mouth daily.   METFORMIN (GLUCOPHAGE) 500 MG TABLET    TAKE 1 TABLET TWICE DAILY   PANTOPRAZOLE (PROTONIX) 40 MG TABLET    TAKE 1 TABLET EVERY DAY   SENNA (SENOKOT) 8.6 MG TABS TABLET    Take 1 tablet by mouth daily as needed for mild constipation.   SPIRONOLACTONE (ALDACTONE) 25 MG TABLET    Take 1 tablet (25 mg total) by mouth daily.   SYNTHROID 50 MCG TABLET    TAKE 1 TABLET EVERY DAY    Review of Systems  Constitutional: Positive for chills, diaphoresis (Night sweats), activity change and fatigue. Negative for fever, appetite change and unexpected weight change.  HENT: Positive for ear pain  (Occasional shooting pain ), hoarse voice, postnasal drip, rhinorrhea, sneezing and trouble swallowing. Negative for congestion, ear discharge, hearing loss, sinus pressure, sore throat, tinnitus and voice change.   Eyes: Negative for photophobia, pain, discharge, redness, itching and visual disturbance.  Respiratory: Positive for chest tightness and shortness of breath. Negative for apnea, cough, choking, wheezing and stridor.   Cardiovascular: Positive for leg swelling. Negative for chest pain and palpitations.  Gastrointestinal: Positive for diarrhea, constipation and abdominal distention (Pt feels "Full all the time" ). Negative for nausea, vomiting, abdominal pain (Pt reports she does not have abdominal pain; but says it does not "Feel Right" ), blood in stool, anal bleeding and rectal pain.       Pt reports having on and off diarrhea and constipation.  She says this has been a chronic issue.   Endocrine: Negative for cold intolerance, heat intolerance, polydipsia, polyphagia and polyuria.  Musculoskeletal: Negative.   Neurological: Positive for dizziness, light-headedness, numbness (In her hands; chronic issue) and headaches. Negative for tremors, seizures, syncope, speech difficulty and weakness.  Psychiatric/Behavioral: Positive for sleep disturbance.    Social History  Substance Use Topics  . Smoking status: Never Smoker   . Smokeless tobacco: Never Used  . Alcohol Use: No   Objective:   BP 106/64 mmHg  Pulse 72  Temp(Src) 98 F (36.7 C) (Oral)  Resp 16  Wt 125 lb (56.7 kg)  LMP  (LMP Unknown)  Physical Exam  Constitutional: She appears well-developed and well-nourished.  Cardiovascular: Normal rate, regular rhythm and normal heart sounds.   Pulmonary/Chest: Effort normal and breath sounds normal.  Abdominal: Soft. Bowel sounds are normal. She exhibits no distension and no mass. There is no tenderness. There is no rebound and no guarding.  Psychiatric: Her speech is normal  and behavior is normal. Judgment and thought content normal. Cognition and memory are normal. She exhibits a depressed mood (Flat affect).        Assessment & Plan:      1. Generalized abdominal pain Unclear etiology.  ? Side effect to Metformin, (will D/C for now) and start a probiotic.  Will check labs.  Recheck in four weeks.   - Probiotic Product (PROBIOTIC DAILY) CAPS; Take 1 capsule by mouth daily.  Dispense: 90 capsule; Refill: 1 - CBC with Differential/Platelet - Comprehensive metabolic panel  2. Idiopathic insomnia Has been an issue for a long time.  Could be part of  the reason why she is feeling so bad.  Will try Mirtazapine to help with sleep and maybe some underlying depression.   - mirtazapine (REMERON) 15 MG tablet; Take 1 tablet (15 mg total) by mouth at bedtime. Start with 1/2 tablet at night for one week, then increase to 1 tablet at bedtime.  Dispense: 90 tablet; Refill: 1  3. Type 2 diabetes mellitus without complication, without long-term current use of insulin (HCC) Will D/C Metformin short term to see if that helps with abdominal pain.  Recheck in four weeks.  Will consider other treatment for diabetes if she improves.      Patient was seen and examined by Leo Grosser, MD, and note scribed by Kavin Leech, CMA.  I have reviewed the document for accuracy and completeness and I agree with above. - Leo Grosser, MD   [name], MD  [facility]

## 2015-12-11 LAB — CBC WITH DIFFERENTIAL/PLATELET
BASOS: 0 %
Basophils Absolute: 0 10*3/uL (ref 0.0–0.2)
EOS (ABSOLUTE): 0.1 10*3/uL (ref 0.0–0.4)
EOS: 2 %
HEMATOCRIT: 32.8 % — AB (ref 34.0–46.6)
Hemoglobin: 10.9 g/dL — ABNORMAL LOW (ref 11.1–15.9)
IMMATURE GRANULOCYTES: 0 %
Immature Grans (Abs): 0 10*3/uL (ref 0.0–0.1)
Lymphocytes Absolute: 1.8 10*3/uL (ref 0.7–3.1)
Lymphs: 56 %
MCH: 29.7 pg (ref 26.6–33.0)
MCHC: 33.2 g/dL (ref 31.5–35.7)
MCV: 89 fL (ref 79–97)
Monocytes Absolute: 0.3 10*3/uL (ref 0.1–0.9)
Monocytes: 9 %
NEUTROS ABS: 1.1 10*3/uL — AB (ref 1.4–7.0)
NEUTROS PCT: 33 %
PLATELETS: 123 10*3/uL — AB (ref 150–379)
RBC: 3.67 x10E6/uL — ABNORMAL LOW (ref 3.77–5.28)
RDW: 14.1 % (ref 12.3–15.4)
WBC: 3.3 10*3/uL — ABNORMAL LOW (ref 3.4–10.8)

## 2015-12-11 LAB — COMPREHENSIVE METABOLIC PANEL
ALBUMIN: 4.1 g/dL (ref 3.6–4.8)
ALT: 7 IU/L (ref 0–32)
AST: 11 IU/L (ref 0–40)
Albumin/Globulin Ratio: 1.5 (ref 1.1–2.5)
Alkaline Phosphatase: 63 IU/L (ref 39–117)
BILIRUBIN TOTAL: 0.3 mg/dL (ref 0.0–1.2)
BUN / CREAT RATIO: 14 (ref 11–26)
BUN: 10 mg/dL (ref 8–27)
CHLORIDE: 98 mmol/L (ref 96–106)
CO2: 28 mmol/L (ref 18–29)
Calcium: 9.4 mg/dL (ref 8.7–10.3)
Creatinine, Ser: 0.69 mg/dL (ref 0.57–1.00)
GFR calc Af Amer: 103 mL/min/{1.73_m2} (ref >59)
GFR calc non Af Amer: 89 mL/min/{1.73_m2} (ref >59)
GLUCOSE: 112 mg/dL — AB (ref 65–99)
Globulin, Total: 2.7 g/dL (ref 1.5–4.5)
Potassium: 3.8 mmol/L (ref 3.5–5.2)
Sodium: 139 mmol/L (ref 134–144)
Total Protein: 6.8 g/dL (ref 6.0–8.5)

## 2015-12-14 ENCOUNTER — Encounter: Payer: Self-pay | Admitting: Cardiology

## 2015-12-14 ENCOUNTER — Telehealth: Payer: Self-pay

## 2015-12-14 NOTE — Telephone Encounter (Signed)
-----   Message from [name], MD sent at 12/11/2015  9:13 PM EST ----- Still mildly anemic and has low white count and platelets.  Not changed from previous.  Please see how patient is feeling. Thanks.

## 2015-12-14 NOTE — Telephone Encounter (Signed)
Advised patient as below.  

## 2015-12-14 NOTE — Telephone Encounter (Signed)
Proceed with plan as discussed.  Will recheck ov in 4 weeks. No need for further medication for diabetes at this time. Bring readings when she comes in for follow up. Thanks.

## 2015-12-14 NOTE — Telephone Encounter (Signed)
Pt advised.  She reports feeling some better since d/c'ing the Metformin.  She reports her Fasting blood sugars are 90's-low 100's.  Her non-fasting sugars are around 140-160.  She has started the Remeron at bedtime;  The first dose made her very sleepy the next day.  She took it for a second time and has felt a little less sleepy today.   Thanks,   -Vernona Rieger

## 2015-12-15 ENCOUNTER — Telehealth: Payer: Self-pay | Admitting: Family Medicine

## 2015-12-15 NOTE — Telephone Encounter (Signed)
Pt would like to speak to a nurse about her blood sugar levels since stopping the Metformin. Pt stated that her blood sugar has been as high as 238 after eating on 12/14/15. Pt stated her sugar today has been under 140. Pt would just like to speak with a nurse about her levels. Please advise. Thanks TNP

## 2015-12-15 NOTE — Telephone Encounter (Signed)
Patient reports that she has been checking her FBS and reports 107-133. This morning FBS was 116. Patient reports that her diarrhea has improved. Patient reports that she will start to watch her diet and eat healthier also to try to keep blood sugar at a normal range. Patient not sure when or if she needs to restart her metformin. Patient's metformin was D/C on 12/10/15 and she has a F/U on 02/09/16. sd

## 2015-12-16 NOTE — Telephone Encounter (Signed)
Attempted to contact patient. No answer nor voicemail.  

## 2015-12-16 NOTE — Telephone Encounter (Signed)
Blood sugar looks good. Continue off metformin and recheck at ov. Thanks.

## 2015-12-17 ENCOUNTER — Encounter: Payer: Self-pay | Admitting: Family

## 2015-12-17 ENCOUNTER — Ambulatory Visit: Payer: Medicare PPO | Attending: Family | Admitting: Family

## 2015-12-17 VITALS — BP 119/58 | HR 67 | Resp 20 | Ht 61.0 in | Wt 127.0 lb

## 2015-12-17 DIAGNOSIS — I420 Dilated cardiomyopathy: Secondary | ICD-10-CM | POA: Diagnosis not present

## 2015-12-17 DIAGNOSIS — Z7982 Long term (current) use of aspirin: Secondary | ICD-10-CM | POA: Insufficient documentation

## 2015-12-17 DIAGNOSIS — E785 Hyperlipidemia, unspecified: Secondary | ICD-10-CM | POA: Diagnosis not present

## 2015-12-17 DIAGNOSIS — K219 Gastro-esophageal reflux disease without esophagitis: Secondary | ICD-10-CM | POA: Insufficient documentation

## 2015-12-17 DIAGNOSIS — I10 Essential (primary) hypertension: Secondary | ICD-10-CM

## 2015-12-17 DIAGNOSIS — I5022 Chronic systolic (congestive) heart failure: Secondary | ICD-10-CM | POA: Diagnosis present

## 2015-12-17 DIAGNOSIS — F329 Major depressive disorder, single episode, unspecified: Secondary | ICD-10-CM | POA: Insufficient documentation

## 2015-12-17 DIAGNOSIS — I110 Hypertensive heart disease with heart failure: Secondary | ICD-10-CM | POA: Insufficient documentation

## 2015-12-17 DIAGNOSIS — R0602 Shortness of breath: Secondary | ICD-10-CM | POA: Insufficient documentation

## 2015-12-17 DIAGNOSIS — E079 Disorder of thyroid, unspecified: Secondary | ICD-10-CM | POA: Diagnosis not present

## 2015-12-17 DIAGNOSIS — E119 Type 2 diabetes mellitus without complications: Secondary | ICD-10-CM | POA: Diagnosis not present

## 2015-12-17 MED ORDER — SPIRONOLACTONE 25 MG PO TABS: 25.0000 mg | ORAL_TABLET | Freq: Every day | ORAL | Status: DC

## 2015-12-17 MED ORDER — FUROSEMIDE 20 MG PO TABS: 20.0000 mg | ORAL_TABLET | Freq: Every day | ORAL | Status: DC

## 2015-12-17 NOTE — Patient Instructions (Signed)
Continue weighing daily and call for an overnight weight gain of > 2 pounds or a weekly weight gain of >5 pounds. 

## 2015-12-17 NOTE — Progress Notes (Signed)
Subjective:    Patient ID: Bethany [name], female    DOB: 1945/12/25, 70 y.o.   MRN: [id number]  Congestive Heart Failure Presents for follow-up visit. The disease [name] has been stable. Associated symptoms include edema, fatigue and shortness of breath (ast times). Pertinent negatives include no abdominal pain, chest pain, chest pressure, orthopnea or palpitations. The symptoms have been stable. Past treatments include beta blockers, angiotensin receptor blockers, aldosterone receptor blockers and salt and fluid restriction. The treatment provided moderate relief. Compliance with prior treatments has been good. Her past medical history is significant for DM, HTN and valvular heart disease. She has multiple 1st degree relatives with heart disease. Compliance with total regimen is 76-100%.  Hypertension This is a chronic problem. The current episode started more than 1 year ago. The problem is unchanged. The problem is controlled. Associated symptoms include peripheral edema and shortness of breath (ast times). Pertinent negatives include no chest pain, headaches, neck pain or palpitations. Agents associated with hypertension include thyroid hormones. Risk factors for coronary artery disease include diabetes mellitus, dyslipidemia and post-menopausal state. Past treatments include angiotensin blockers, beta blockers, diuretics and lifestyle changes. The current treatment provides moderate improvement. There are no compliance problems.  Hypertensive end-organ damage includes heart failure.    Past Medical History  Diagnosis Date  . Diabetes ([place])   . Hypertension   . Hyperlipidemia   . Reflux   . Diverticulosis   . Thrombocytopenia ([place])   . CHF (congestive heart failure) ([place])   . LV dysfunction   . VHD (valvular heart disease)   . Dilated cardiomyopathy ([place])   . Thyroid disease   . Depression   . GERD (gastroesophageal reflux disease)   . Anxiety     Past Surgical History  Procedure  Laterality Date  . Cardiac catheterization    . Cholecystectomy    . Partial hysterectomy    . Bone marrow biopsy    . Bi-ventricular implantable cardioverter defibrillator [name] 02/11/2015    Procedure: BI-VENTRICULAR IMPLANTABLE CARDIOVERTER DEFIBRILLATOR  (CRT-D);  Surgeon: Deboraha Sprang, MD;  Location: [facility] CATH LAB;  Service: Cardiovascular;  Laterality: [name];  . Lead revision [name] 02/11/2015    Procedure: LEAD REVISION;  Surgeon: Deboraha Sprang, MD;  Location: [facility] CATH LAB;  Service: Cardiovascular;  Laterality: [name];  . Insert / replace / remove pacemaker    . Tooth extraction  09/23/2015    Family History  Problem Relation Age of Onset  . Heart disease Mother   . Heart attack Mother   . Prostate cancer Father   . Ulcers Father   . Stroke Sister   . Heart attack Sister   . Diabetes Sister   . Heart attack Brother   . Stroke Brother     Social History  Substance Use Topics  . Smoking status: Never Smoker   . Smokeless tobacco: Never Used  . Alcohol Use: No    Allergies  Allergen Reactions  . Other Other (See Comments)    NO BLOOD PRODUCTS- JEHOVAHS WITNESS  . Sulfa Antibiotics Other (See Comments)    Loss of taste    Prior to Admission medications   Medication Sig Start Date End Date Taking? Authorizing Provider  acetaminophen (TYLENOL) 500 MG tablet Take 1,000 mg by mouth 2 (two) times daily as needed for mild pain or moderate pain.   Yes Historical Provider, MD  Alcohol Swabs (B-D SINGLE USE SWABS REGULAR) PADS USE  TO CHECK BLOOD SUGAR ONE TIME DAILY 11/09/15  Yes Margarita Rana, MD  ALPRAZolam Duanne Moron) 0.5 MG tablet Take 1 tablet (0.5 mg total) by mouth at bedtime as needed for anxiety. 05/13/15  Yes Margarita Rana, MD  aspirin EC 81 MG tablet Take 81 mg by mouth daily.   Yes Historical Provider, MD  carvedilol (COREG) 6.25 MG tablet Take 1 tablet by mouth 2 (two) times daily. 03/26/15  Yes Historical Provider, MD  ferrous sulfate 325 (65 FE) MG tablet Take 325 mg by mouth 2  (two) times daily with a meal.   Yes Historical Provider, MD  furosemide (LASIX) 20 MG tablet Take 20 mg by mouth daily.   Yes Historical Provider, MD  losartan (COZAAR) 25 MG tablet Take 1 tablet (25 mg total) by mouth daily. 03/20/15  Yes Deboraha Sprang, MD  mirtazapine (REMERON) 15 MG tablet Take 1 tablet (15 mg total) by mouth at bedtime. Start with 1/2 tablet at night for one week, then increase to 1 tablet at bedtime. 12/10/15  Yes Margarita Rana, MD  pantoprazole (PROTONIX) 40 MG tablet TAKE 1 TABLET EVERY DAY 08/24/15  Yes Margarita Rana, MD  Probiotic Product (PROBIOTIC DAILY) CAPS Take 1 capsule by mouth daily. 12/10/15  Yes Margarita Rana, MD  senna (SENOKOT) 8.6 MG TABS tablet Take 1 tablet by mouth daily as needed for mild constipation.   Yes Historical Provider, MD  spironolactone (ALDACTONE) 25 MG tablet Take 1 tablet (25 mg total) by mouth daily. 09/18/15  Yes Alisa Graff, FNP  SYNTHROID 50 MCG tablet TAKE 1 TABLET EVERY DAY 05/27/15  Yes Margarita Rana, MD     Review of Systems  Constitutional: Positive for fatigue. Negative for appetite change.  HENT: Positive for rhinorrhea. Negative for congestion and sore throat.   Eyes: Negative.   Respiratory: Positive for shortness of breath (ast times). Negative for cough, chest tightness and wheezing.   Cardiovascular: Positive for leg swelling. Negative for chest pain and palpitations.  Gastrointestinal: Negative for abdominal pain, diarrhea and abdominal distention.  Endocrine: Negative.   Genitourinary: Negative.   Musculoskeletal: Negative for back pain and neck pain.  Skin: Negative.   Allergic/Immunologic: Negative.   Neurological: Positive for light-headedness. Negative for dizziness and headaches.  Hematological: Negative for adenopathy. Does not bruise/bleed easily.  Psychiatric/Behavioral: Positive for dysphoric mood. Negative for sleep disturbance (sleeping on 2-3 pilllows). The patient is not nervous/anxious.         Objective:   Physical Exam  Constitutional: She is oriented to person, place, and time. She appears well-developed and well-nourished.  HENT:  Head: Normocephalic and atraumatic.  Eyes: Conjunctivae are normal. Pupils are equal, round, and reactive to light.  Neck: Normal range of motion. Neck supple.  Cardiovascular: Normal rate and regular rhythm.   No murmur heard. Pulmonary/Chest: Effort normal. She has no wheezes. She has no rales.  Abdominal: Soft. Bowel sounds are normal.  Musculoskeletal: She exhibits edema (trace amount pitting edema in bilateral lower legs). She exhibits no tenderness.  Neurological: She is alert and oriented to person, place, and time.  Skin: Skin is warm and dry.  Psychiatric: She has a normal mood and affect. Her behavior is normal. Thought content normal.  Nursing note and vitals reviewed.   BP 119/58 mmHg  Pulse 67  Resp 20  Ht '5\' 1"'  (1.549 m)  Wt 127 lb (57.607 kg)  BMI 24.01 kg/m2  SpO2 96%  LMP  (LMP Unknown)       Assessment & Plan:  1: Chronic heart failure with reduced  ejection fraction- Patient presents with fatigue and shortness of breath at times upon exertion. She says that she didn't have any problems walking into the office today. When she does experience symptoms, she will stop what she's doing to rest until her symptoms improve. She continues to weigh herself and her home weight chart shows a gradual weight gain and she says that she's been eating more. By our scale, her weight has gone up 3 pounds in the last month. Reminded to call for an overnight weight gain of >2 pounds or a weekly weight gain of >5 pounds. She is not adding any salt to her food and tries to follow a low sodium diet. Continues to have a little swelling in her lower legs and she does try to elevate them during the day. Would like to change her losartan to entresto but not sure her blood pressure could handle it. Will discuss this with her at her next appointment.  2:  HTN- Blood pressure looks good today. Systolic has been <033 at previous visits. Continue to monitor. 3: Diabetes- Her fasting glucose this morning was 131. Her PCP has taken her off of metformin due to GI upset and she's currently not on any diabetes medication. She is trying to eat healthier. She has a follow-up appointment at her PCP's in about a month but says that she's going to call her and report her last week's glucose readings to see if she wants to place her on any medication.  Return here in 3 months or sooner for any questions/problems before then.

## 2015-12-17 NOTE — Telephone Encounter (Signed)
Tried calling; no answer.   Thanks,   -Jameek Bruntz  

## 2015-12-24 NOTE — Telephone Encounter (Signed)
Pt advised as below

## 2015-12-29 ENCOUNTER — Telehealth: Payer: Self-pay | Admitting: Family Medicine

## 2015-12-29 NOTE — Telephone Encounter (Signed)
No need to check more regularly if not on insulin. Thanks.

## 2015-12-29 NOTE — Telephone Encounter (Signed)
Pt called wanting to know if she could start testing her glucose more times a day.  Her call back is  [phone]  Thanks Barth Kirks

## 2015-12-30 NOTE — Telephone Encounter (Signed)
Patient advised and verbally voiced understanding.  

## 2015-12-30 NOTE — Telephone Encounter (Signed)
Left message to call back  

## 2016-01-12 ENCOUNTER — Telehealth: Payer: Self-pay | Admitting: Family Medicine

## 2016-01-12 NOTE — Telephone Encounter (Signed)
Spoke with patient for the past 2 weeks her sugar has been going up, fastings have been 147 to 197, 4 hours after eating breakfast it has been 180s to over high 200s. She has been trying to watch her diet. She is getting concerned that it is increasing. She is not on any medications at this time for her sugar and states she has an appointment with you in 1 month-aa

## 2016-01-12 NOTE — Telephone Encounter (Signed)
Would make follow up sooner. Please check what  Medication she has been on in the past. Could restart it. Thanks.

## 2016-01-12 NOTE — Telephone Encounter (Signed)
Please triage. Thanks.

## 2016-01-12 NOTE — Telephone Encounter (Signed)
Pt called saying her blood sugar reading have been running high in the morning 195 to the lower 200's.  She would like a nurse to call her back.  Her call back is [phone]  Thanks  Barth Kirks

## 2016-01-13 NOTE — Telephone Encounter (Signed)
Patient can't remember what medication that she use to take. Rescheduled F/U appt to 01/26/16 to discuss blood sugars.

## 2016-01-26 ENCOUNTER — Ambulatory Visit (INDEPENDENT_AMBULATORY_CARE_PROVIDER_SITE_OTHER): Payer: Medicare PPO | Admitting: Family Medicine

## 2016-01-26 ENCOUNTER — Encounter: Payer: Self-pay | Admitting: Family Medicine

## 2016-01-26 VITALS — BP 104/62 | HR 84 | Temp 98.8°F | Resp 16 | Wt 132.0 lb

## 2016-01-26 DIAGNOSIS — F5101 Primary insomnia: Secondary | ICD-10-CM

## 2016-01-26 DIAGNOSIS — E119 Type 2 diabetes mellitus without complications: Secondary | ICD-10-CM

## 2016-01-26 MED ORDER — METFORMIN HCL 500 MG PO TABS: 500.0000 mg | ORAL_TABLET | Freq: Two times a day (BID) | ORAL | Status: DC

## 2016-01-26 NOTE — Progress Notes (Signed)
Patient ID: Bethany [name], female   DOB: 09/08/1946, 70 y.o.   MRN: [id number]        Patient: Bethany [name] Frommer Female    DOB: 1946/09/11   25 y.o.   MRN: [id number] Visit Date: 01/26/2016  Today's Provider: [name], MD   Chief Complaint  Patient presents with  . Diabetes  . Insomnia   Subjective:    Diabetes She presents for her follow-up diabetic visit. She has type 2 diabetes mellitus. Her disease course has been worsening (Pt stopped Metformin secondary to side effects.). Hypoglycemia symptoms include nervousness/anxiousness. Pertinent negatives for hypoglycemia include no confusion, dizziness or headaches. Associated symptoms include blurred vision, fatigue and visual change. Pertinent negatives for diabetes include no chest pain, no foot paresthesias, no foot ulcerations, no polydipsia, no polyphagia, no polyuria, no weakness and no weight loss. Diabetic complications include heart disease. Risk factors for coronary artery disease include diabetes mellitus. When asked about current treatments, none were reported. She is compliant with treatment all of the time. Her weight is stable. Her breakfast blood glucose range is generally 180-200 mg/dl. She does not see a podiatrist.Eye exam is current.  Insomnia Primary symptoms: sleep disturbance (Pt reports improvement on current medications.), difficulty falling asleep, frequent awakening.  The current episode started more than one month. The problem occurs nightly. The problem has been gradually improving (Pt reports sleeping better on Mirtazapine. ) since onset. How many beverages per day that contain caffeine: 0 - 1.  Types of beverages you drink: coffee. Past treatments include medication. The treatment provided significant relief. Typical bedtime:  8-10 P.M..  How long after going to bed to you fall asleep: 15-30 minutes.   PMH includes: no apnea.       Allergies  Allergen Reactions  . Other Other (See Comments)    NO BLOOD  PRODUCTS- JEHOVAHS WITNESS  . Sulfa Antibiotics Other (See Comments)    Loss of taste   Previous Medications   ACETAMINOPHEN (TYLENOL) 500 MG TABLET    Take 1,000 mg by mouth 2 (two) times daily as needed for mild pain or moderate pain.   ALCOHOL SWABS (B-D SINGLE USE SWABS REGULAR) PADS    USE  TO CHECK BLOOD SUGAR ONE TIME DAILY   ALPRAZOLAM (XANAX) 0.5 MG TABLET    Take 1 tablet (0.5 mg total) by mouth at bedtime as needed for anxiety.   ASPIRIN EC 81 MG TABLET    Take 81 mg by mouth daily.   CARVEDILOL (COREG) 6.25 MG TABLET    Take 1 tablet by mouth 2 (two) times daily.   FERROUS SULFATE 325 (65 FE) MG TABLET    Take 325 mg by mouth 2 (two) times daily with a meal.   FUROSEMIDE (LASIX) 20 MG TABLET    Take 1 tablet (20 mg total) by mouth daily.   LOSARTAN (COZAAR) 25 MG TABLET    Take 1 tablet (25 mg total) by mouth daily.   MIRTAZAPINE (REMERON) 15 MG TABLET    Take 1 tablet (15 mg total) by mouth at bedtime. Start with 1/2 tablet at night for one week, then increase to 1 tablet at bedtime.   PANTOPRAZOLE (PROTONIX) 40 MG TABLET    TAKE 1 TABLET EVERY DAY   PROBIOTIC PRODUCT (PROBIOTIC DAILY) CAPS    Take 1 capsule by mouth daily.   SENNA (SENOKOT) 8.6 MG TABS TABLET    Take 1 tablet by mouth daily as needed for mild constipation.   SPIRONOLACTONE (ALDACTONE)  25 MG TABLET    Take 1 tablet (25 mg total) by mouth daily.   SYNTHROID 50 MCG TABLET    TAKE 1 TABLET EVERY DAY    Review of Systems  Constitutional: Positive for appetite change (Increased appetitie), fatigue and unexpected weight change (Pt reports gaining weight. ). Negative for fever, chills, weight loss, diaphoresis and activity change.  HENT: Positive for tinnitus.   Eyes: Positive for blurred vision.  Respiratory: Positive for cough. Negative for apnea, choking, chest tightness, shortness of breath, wheezing and stridor.   Cardiovascular: Positive for leg swelling. Negative for chest pain and palpitations.    Gastrointestinal: Positive for abdominal pain (Pt says she has a little pain but is much better. ). Negative for nausea, vomiting, diarrhea, constipation, blood in stool, abdominal distention, anal bleeding and rectal pain.  Endocrine: Negative for cold intolerance, heat intolerance, polydipsia, polyphagia and polyuria.  Neurological: Negative for dizziness, weakness, light-headedness and headaches.  Psychiatric/Behavioral: Positive for sleep disturbance (Pt reports improvement on current medications.), dysphoric mood and decreased concentration. Negative for suicidal ideas, hallucinations, behavioral problems, confusion, self-injury and agitation. The patient is nervous/anxious and has insomnia. The patient is not hyperactive.     Social History  Substance Use Topics  . Smoking status: Never Smoker   . Smokeless tobacco: Never Used  . Alcohol Use: No   Objective:   BP 104/62 mmHg  Pulse 84  Temp(Src) 98.8 F (37.1 C) (Oral)  Resp 16  Wt 132 lb (59.875 kg)  LMP  (LMP Unknown)  Physical Exam  Constitutional: She is oriented to person, place, and time. She appears well-developed and well-nourished.  Neurological: She is alert and oriented to person, place, and time.  Psychiatric: She has a normal mood and affect. Her behavior is normal. Judgment and thought content normal.        Assessment & Plan:     1. Type 2 diabetes mellitus without complication, without long-term current use of insulin (HCC) Blood sugar has been increasing since being off Metformin.  Pt reports her abdominal pain has improved since starting a probiotic and Mirtazapine.  Will restart Metformin; advised pt if her abdominal pain returns to call and will start Januvia at that time.  Recheck blood sugar in 6 weeks.   - metFORMIN (GLUCOPHAGE) 500 MG tablet; Take 1 tablet (500 mg total) by mouth 2 (two) times daily with a meal.  Dispense: 180 tablet; Refill: 1  2. Idiopathic insomnia Has improved with Mirtazapine;  however pt complains of increased weight and appetite.  Advised pt to cut the tablet in half and try that.      Patient was seen and examined by Leo Grosser, MD, and note scribed by Kavin Leech, CMA. I have reviewed the document for accuracy and completeness and I agree with above. - Leo Grosser, MD    [name], MD  [facility]

## 2016-01-29 ENCOUNTER — Telehealth: Payer: Self-pay

## 2016-01-29 NOTE — Telephone Encounter (Signed)
Increase to 500 mg three times daily and see if helps. Thanks.

## 2016-01-29 NOTE — Telephone Encounter (Signed)
Advised pt as below. Emily Drozdowski, CMA  

## 2016-01-29 NOTE — Telephone Encounter (Signed)
Pt's BS is still in the mid 250's, even after restarting Metformin 500 mg BID on Tuesday. Pt is asking how long it will be until she notices a difference? CB# [phone]. [name], CMA

## 2016-02-08 ENCOUNTER — Telehealth: Payer: Self-pay | Admitting: Family Medicine

## 2016-02-08 NOTE — Telephone Encounter (Signed)
Pt says her blood sugar has been around 120 every morning.  She feels this is high and wants to know if you think she needs to change medication.  Her call back is [phone]  Thanks Barth Kirks

## 2016-02-08 NOTE — Telephone Encounter (Signed)
Think that level is ok. Follow up as scheduled. Thanks.

## 2016-02-08 NOTE — Telephone Encounter (Signed)
Advised  ED 

## 2016-02-09 ENCOUNTER — Ambulatory Visit: Payer: Medicare PPO | Admitting: Family Medicine

## 2016-03-04 ENCOUNTER — Ambulatory Visit: Payer: Self-pay | Admitting: Sports Medicine

## 2016-03-07 ENCOUNTER — Other Ambulatory Visit: Payer: Self-pay | Admitting: Family Medicine

## 2016-03-07 DIAGNOSIS — E119 Type 2 diabetes mellitus without complications: Secondary | ICD-10-CM

## 2016-03-07 NOTE — Telephone Encounter (Signed)
Pt needs refills on her lancets and test strips.  She has the [employer] Glucose meter.  She also needs alcohol swabs.  Her call back is [phone]  She uses [employer].  ThanksTeri

## 2016-03-08 MED ORDER — GLUCOSE BLOOD VI STRP: ORAL_STRIP | Status: DC

## 2016-03-08 MED ORDER — BD SWAB SINGLE USE REGULAR PADS: MEDICATED_PAD | Status: DC

## 2016-03-08 MED ORDER — TRUEPLUS LANCETS 33G MISC: Status: DC

## 2016-03-15 ENCOUNTER — Encounter: Payer: Self-pay | Admitting: Family Medicine

## 2016-03-15 ENCOUNTER — Ambulatory Visit: Payer: Medicare PPO | Attending: Family | Admitting: Family

## 2016-03-15 ENCOUNTER — Encounter: Payer: Self-pay | Admitting: Family

## 2016-03-15 ENCOUNTER — Ambulatory Visit (INDEPENDENT_AMBULATORY_CARE_PROVIDER_SITE_OTHER): Payer: Medicare PPO | Admitting: Family Medicine

## 2016-03-15 VITALS — BP 90/58 | HR 68 | Temp 97.4°F | Resp 16 | Wt 134.0 lb

## 2016-03-15 VITALS — BP 105/58 | HR 77 | Resp 18 | Ht 61.0 in | Wt 134.0 lb

## 2016-03-15 DIAGNOSIS — Z833 Family history of diabetes mellitus: Secondary | ICD-10-CM | POA: Diagnosis not present

## 2016-03-15 DIAGNOSIS — I110 Hypertensive heart disease with heart failure: Secondary | ICD-10-CM | POA: Insufficient documentation

## 2016-03-15 DIAGNOSIS — F329 Major depressive disorder, single episode, unspecified: Secondary | ICD-10-CM | POA: Diagnosis not present

## 2016-03-15 DIAGNOSIS — Z90711 Acquired absence of uterus with remaining cervical stump: Secondary | ICD-10-CM | POA: Diagnosis not present

## 2016-03-15 DIAGNOSIS — Z7984 Long term (current) use of oral hypoglycemic drugs: Secondary | ICD-10-CM | POA: Diagnosis not present

## 2016-03-15 DIAGNOSIS — M542 Cervicalgia: Secondary | ICD-10-CM | POA: Insufficient documentation

## 2016-03-15 DIAGNOSIS — I5022 Chronic systolic (congestive) heart failure: Secondary | ICD-10-CM

## 2016-03-15 DIAGNOSIS — R0602 Shortness of breath: Secondary | ICD-10-CM | POA: Insufficient documentation

## 2016-03-15 DIAGNOSIS — K5790 Diverticulosis of intestine, part unspecified, without perforation or abscess without bleeding: Secondary | ICD-10-CM | POA: Insufficient documentation

## 2016-03-15 DIAGNOSIS — E119 Type 2 diabetes mellitus without complications: Secondary | ICD-10-CM

## 2016-03-15 DIAGNOSIS — Z79899 Other long term (current) drug therapy: Secondary | ICD-10-CM | POA: Diagnosis not present

## 2016-03-15 DIAGNOSIS — K219 Gastro-esophageal reflux disease without esophagitis: Secondary | ICD-10-CM | POA: Diagnosis not present

## 2016-03-15 DIAGNOSIS — I420 Dilated cardiomyopathy: Secondary | ICD-10-CM | POA: Diagnosis not present

## 2016-03-15 DIAGNOSIS — I5032 Chronic diastolic (congestive) heart failure: Secondary | ICD-10-CM | POA: Diagnosis not present

## 2016-03-15 DIAGNOSIS — Z8249 Family history of ischemic heart disease and other diseases of the circulatory system: Secondary | ICD-10-CM | POA: Diagnosis not present

## 2016-03-15 DIAGNOSIS — D696 Thrombocytopenia, unspecified: Secondary | ICD-10-CM | POA: Insufficient documentation

## 2016-03-15 DIAGNOSIS — E079 Disorder of thyroid, unspecified: Secondary | ICD-10-CM | POA: Insufficient documentation

## 2016-03-15 DIAGNOSIS — F419 Anxiety disorder, unspecified: Secondary | ICD-10-CM | POA: Insufficient documentation

## 2016-03-15 DIAGNOSIS — E785 Hyperlipidemia, unspecified: Secondary | ICD-10-CM | POA: Insufficient documentation

## 2016-03-15 DIAGNOSIS — Z9889 Other specified postprocedural states: Secondary | ICD-10-CM | POA: Diagnosis not present

## 2016-03-15 DIAGNOSIS — Z7982 Long term (current) use of aspirin: Secondary | ICD-10-CM | POA: Insufficient documentation

## 2016-03-15 DIAGNOSIS — I10 Essential (primary) hypertension: Secondary | ICD-10-CM

## 2016-03-15 DIAGNOSIS — Z823 Family history of stroke: Secondary | ICD-10-CM | POA: Diagnosis not present

## 2016-03-15 DIAGNOSIS — Z8042 Family history of malignant neoplasm of prostate: Secondary | ICD-10-CM | POA: Diagnosis not present

## 2016-03-15 DIAGNOSIS — Z9049 Acquired absence of other specified parts of digestive tract: Secondary | ICD-10-CM | POA: Diagnosis not present

## 2016-03-15 LAB — POCT GLYCOSYLATED HEMOGLOBIN (HGB A1C)
Est. average glucose Bld gHb Est-mCnc: 194
HEMOGLOBIN A1C: 8.4

## 2016-03-15 MED ORDER — SITAGLIPTIN PHOSPHATE 100 MG PO TABS: 100.0000 mg | ORAL_TABLET | Freq: Every day | ORAL | Status: DC

## 2016-03-15 MED ORDER — SACUBITRIL-VALSARTAN 24-26 MG PO TABS: 1.0000 | ORAL_TABLET | Freq: Two times a day (BID) | ORAL | Status: DC

## 2016-03-15 MED ORDER — METFORMIN HCL 1000 MG PO TABS: 1000.0000 mg | ORAL_TABLET | Freq: Two times a day (BID) | ORAL | Status: DC

## 2016-03-15 NOTE — Patient Instructions (Addendum)
Continue weighing daily and call for an overnight weight gain of > 2 pounds or a weekly weight gain of >5 pounds.   Stop Losartan and begin taking Entresto 24/26mg  twice daily. You can take your losartan tonight at supper and then begin taking entresto tomorrow morning or you can take 1 entresto tonight in place of the losartan and then begin taking it twice daily tomorrow.

## 2016-03-15 NOTE — Progress Notes (Signed)
Subjective:    Patient ID: Bethany [name], female    DOB: [birth year], 70 y.o.   MRN: [id number]  Diabetes She presents for her follow-up (Last A1C was checked 11/11/2015 and was 7.3%. Pt started on Metfromin 500 mg BID since LOV.) diabetic visit. She has type 2 diabetes mellitus. There are no hypoglycemic associated symptoms. Associated symptoms include blurred vision, fatigue and polyuria (due to diuretic). Pertinent negatives for diabetes include no chest pain, no foot paresthesias, no foot ulcerations, no polydipsia, no polyphagia, no visual change and no weakness. Symptoms are stable (fatigue is worsening). Risk factors for coronary artery disease include diabetes mellitus, hypertension, post-menopausal and family history. Current diabetic treatment includes oral agent (monotherapy). She is compliant with treatment all of the time. She is following a generally healthy diet. She participates in exercise daily (walks for 30 minutes). Her home blood glucose trend is increasing steadily. An ACE inhibitor/angiotensin II receptor blocker is being taken. Eye exam is current (last week per pt).      Review of Systems  Constitutional: Positive for fatigue.  Eyes: Positive for blurred vision.  Cardiovascular: Negative for chest pain.  Endocrine: Positive for polyuria (due to diuretic). Negative for polydipsia and polyphagia.  Neurological: Negative for weakness.   BP 90/58 mmHg  Pulse 68  Temp(Src) 97.4 F (36.3 C) (Oral)  Resp 16  Wt 134 lb (60.782 kg)  LMP  (LMP Unknown)   Patient Active Problem List   Diagnosis Date Noted  . GERD (gastroesophageal reflux disease) 08/24/2015  . Automatic implantable cardioverter-defibrillator in situ 07/29/2015  . Hypotension 06/18/2015  . Benign essential HTN 03/20/2015  . Absolute anemia 03/11/2015  . Abnormal liver enzymes 03/11/2015  . Decreased potassium in the blood 03/11/2015  . Hypercholesteremia 03/11/2015  . Adult hypothyroidism 03/11/2015    . Idiopathic insomnia 03/11/2015  . OP (osteoporosis) 03/11/2015  . Diabetes mellitus, type 2 ([place]) 03/11/2015  . LBBB (left bundle branch block) 02/11/2015  . Paroxysmal supraventricular tachycardia ([place]) 11/25/2014  . Chronic systolic heart failure ([place]) 09/25/2014  . Heart valve disease 04/10/2014   Past Medical History  Diagnosis Date  . Diabetes ([place])   . Hypertension   . Hyperlipidemia   . Reflux   . Diverticulosis   . Thrombocytopenia ([place])   . CHF (congestive heart failure) ([place])   . LV dysfunction   . VHD (valvular heart disease)   . Dilated cardiomyopathy ([place])   . Thyroid disease   . Depression   . GERD (gastroesophageal reflux disease)   . Anxiety    Current Outpatient Prescriptions on File Prior to Visit  Medication Sig  . acetaminophen (TYLENOL) 500 MG tablet Take 1,000 mg by mouth 2 (two) times daily as needed for mild pain or moderate pain.  . Alcohol Swabs (B-D SINGLE USE SWABS REGULAR) PADS Use to check blood sugar once a day.  Marland Kitchen aspirin EC 81 MG tablet Take 81 mg by mouth daily.  . carvedilol (COREG) 6.25 MG tablet Take 1 tablet by mouth 2 (two) times daily.  . ferrous sulfate 325 (65 FE) MG tablet Take 325 mg by mouth 2 (two) times daily with a meal.  . furosemide (LASIX) 20 MG tablet Take 1 tablet (20 mg total) by mouth daily.  Marland Kitchen glucose blood (TRUE METRIX BLOOD GLUCOSE TEST) test strip To check blood sugar once a day.  DX E11.9  . metFORMIN (GLUCOPHAGE) 500 MG tablet Take 1 tablet (500 mg total) by mouth 2 (two) times daily with a  meal. (Patient taking differently: Take 500 mg by mouth 3 (three) times daily. )  . mirtazapine (REMERON) 15 MG tablet Take 1 tablet (15 mg total) by mouth at bedtime. Start with 1/2 tablet at night for one week, then increase to 1 tablet at bedtime. (Patient taking differently: Take 15 mg by mouth at bedtime as needed. Start with 1/2 tablet at night for one week, then increase to 1 tablet at bedtime.)  . pantoprazole (PROTONIX)  40 MG tablet TAKE 1 TABLET EVERY DAY  . Probiotic Product (PROBIOTIC DAILY) CAPS Take 1 capsule by mouth daily.  . sacubitril-valsartan (ENTRESTO) 24-26 MG Take 1 tablet by mouth 2 (two) times daily.  Marland Kitchen senna (SENOKOT) 8.6 MG TABS tablet Take 1 tablet by mouth daily as needed for mild constipation.  Marland Kitchen spironolactone (ALDACTONE) 25 MG tablet Take 1 tablet (25 mg total) by mouth daily.  Marland Kitchen SYNTHROID 50 MCG tablet TAKE 1 TABLET EVERY DAY  . TRUEPLUS LANCETS 33G MISC To check blood sugar once a day.  DX: E11.9  . ALPRAZolam (XANAX) 0.5 MG tablet Take 1 tablet (0.5 mg total) by mouth at bedtime as needed for anxiety. (Patient not taking: Reported on 03/15/2016)   No current facility-administered medications on file prior to visit.   Allergies  Allergen Reactions  . Other Other (See Comments)    NO BLOOD PRODUCTS- JEHOVAHS WITNESS  . Sulfa Antibiotics Other (See Comments)    Loss of taste   Past Surgical History  Procedure Laterality Date  . Cardiac catheterization    . Cholecystectomy    . Partial hysterectomy    . Bone marrow biopsy    . Bi-ventricular implantable cardioverter defibrillator [name] 02/11/2015    Procedure: BI-VENTRICULAR IMPLANTABLE CARDIOVERTER DEFIBRILLATOR  (CRT-D);  Surgeon: Deboraha Sprang, MD;  Location: [facility] CATH LAB;  Service: Cardiovascular;  Laterality: [name];  . Lead revision [name] 02/11/2015    Procedure: LEAD REVISION;  Surgeon: Deboraha Sprang, MD;  Location: [facility] CATH LAB;  Service: Cardiovascular;  Laterality: [name];  . Insert / replace / remove pacemaker    . Tooth extraction  09/23/2015   Social History   Social History  . Marital Status: Divorced    Spouse Name: [name]  . Number of Children: 4  . Years of Education: [name]   Occupational History  . retired    Social History Main Topics  . Smoking status: Never Smoker   . Smokeless tobacco: Never Used  . Alcohol Use: No  . Drug Use: No  . Sexual Activity: Not on file   Other Topics Concern  . Not on file   Social  History Narrative   Family History  Problem Relation Age of Onset  . Heart disease Mother   . Heart attack Mother   . Prostate cancer Father   . Ulcers Father   . Stroke Sister   . Heart attack Sister   . Diabetes Sister   . Heart attack Brother   . Stroke Brother        Objective:   Physical Exam  Constitutional: She is oriented to person, place, and time. She appears well-developed and well-nourished.  Neurological: She is alert and oriented to person, place, and time.  Monofilament normal.  Psychiatric: She has a normal mood and affect. Her behavior is normal. Judgment and thought content normal.      Assessment & Plan:  1. Type 2 diabetes mellitus without complication, without long-term current use of insulin (HCC) Worsening. Increase Metformin to 1000  mg po BID, and add Januvia as below. FU with Dr. Caryn Section in 3 months. - POCT glycosylated hemoglobin (Hb A1C) - metFORMIN (GLUCOPHAGE) 1000 MG tablet; Take 1 tablet (1,000 mg total) by mouth 2 (two) times daily with a meal.  Dispense: 60 tablet; Refill: 5 - sitaGLIPtin (JANUVIA) 100 MG tablet; Take 1 tablet (100 mg total) by mouth daily.  Dispense: 30 tablet; Refill: 5   Diabetic Foot Exam - Simple   Simple Foot Form  Diabetic Foot exam was performed with the following findings:  Yes 03/15/2016 12:12 PM  Visual Inspection  No deformities, no ulcerations, no other skin breakdown bilaterally:  Yes  Sensation Testing  Intact to touch and monofilament testing bilaterally:  Yes  Pulse Check  Posterior Tibialis and Dorsalis pulse intact bilaterally:  Yes  Comments      Patient seen and examined by Jerrell Belfast, MD, and note scribed by Renaldo Fiddler, CMA.   I have reviewed the document for accuracy and completeness and I agree with above. Jerrell Belfast, MD   Margarita Rana, MD

## 2016-03-15 NOTE — Progress Notes (Signed)
Subjective:    Patient ID: Bethany [name], female    DOB: 20-Feb-1946, 70 y.o.   MRN: [id number]  Congestive Heart Failure Presents for follow-up visit. The disease [name] has been stable. Associated symptoms include edema, fatigue, palpitations (at times) and shortness of breath. Pertinent negatives include no abdominal pain, chest pain, chest pressure or orthopnea. The symptoms have been stable. Past treatments include angiotensin receptor blockers, aldosterone receptor blockers, beta blockers and salt and fluid restriction. The treatment provided moderate relief. Compliance with prior treatments has been good. Her past medical history is significant for DM, HTN and valvular heart disease. There is no history of CAD or CVA. She has multiple 1st degree relatives with heart disease.  Hypertension This is a chronic problem. The current episode started more than 1 year ago. The problem is controlled. Associated symptoms include neck pain (left side of the neck), palpitations (at times), peripheral edema and shortness of breath. Pertinent negatives include no chest pain or headaches. Agents associated with hypertension include thyroid hormones. Risk factors for coronary artery disease include diabetes mellitus, dyslipidemia, family history and post-menopausal state. Past treatments include angiotensin blockers, beta blockers, diuretics and lifestyle changes. Hypertensive end-organ damage includes heart failure. There is no history of CVA.  Other This is a new (neck pain) problem. The current episode started in the past 7 days. The problem occurs daily. The problem has been unchanged. Associated symptoms include coughing (at times), fatigue and neck pain (left side of the neck). Pertinent negatives include no abdominal pain, chest pain, congestion, headaches, numbness, sore throat, vertigo or visual change. The symptoms are aggravated by twisting. She has tried nothing for the symptoms.   Past Medical  History  Diagnosis Date  . Diabetes ([place])   . Hypertension   . Hyperlipidemia   . Reflux   . Diverticulosis   . Thrombocytopenia ([place])   . CHF (congestive heart failure) ([place])   . LV dysfunction   . VHD (valvular heart disease)   . Dilated cardiomyopathy ([place])   . Thyroid disease   . Depression   . GERD (gastroesophageal reflux disease)   . Anxiety     Past Surgical History  Procedure Laterality Date  . Cardiac catheterization    . Cholecystectomy    . Partial hysterectomy    . Bone marrow biopsy    . Bi-ventricular implantable cardioverter defibrillator [name] 02/11/2015    Procedure: BI-VENTRICULAR IMPLANTABLE CARDIOVERTER DEFIBRILLATOR  (CRT-D);  Surgeon: Deboraha Sprang, MD;  Location: [facility] CATH LAB;  Service: Cardiovascular;  Laterality: [name];  . Lead revision [name] 02/11/2015    Procedure: LEAD REVISION;  Surgeon: Deboraha Sprang, MD;  Location: [facility] CATH LAB;  Service: Cardiovascular;  Laterality: [name];  . Insert / replace / remove pacemaker    . Tooth extraction  09/23/2015    Family History  Problem Relation Age of Onset  . Heart disease Mother   . Heart attack Mother   . Prostate cancer Father   . Ulcers Father   . Stroke Sister   . Heart attack Sister   . Diabetes Sister   . Heart attack Brother   . Stroke Brother     Social History  Substance Use Topics  . Smoking status: Never Smoker   . Smokeless tobacco: Never Used  . Alcohol Use: No    Allergies  Allergen Reactions  . Other Other (See Comments)    NO BLOOD PRODUCTS- JEHOVAHS WITNESS  . Sulfa Antibiotics Other (See Comments)  Loss of taste    Prior to Admission medications   Medication Sig Start Date End Date Taking? Authorizing Provider  acetaminophen (TYLENOL) 500 MG tablet Take 1,000 mg by mouth 2 (two) times daily as needed for mild pain or moderate pain.   Yes Historical Provider, MD  Alcohol Swabs (B-D SINGLE USE SWABS REGULAR) PADS Use to check blood sugar once a day. 03/08/16  Yes Margarita Rana, MD   ALPRAZolam Duanne Moron) 0.5 MG tablet Take 1 tablet (0.5 mg total) by mouth at bedtime as needed for anxiety. 05/13/15  Yes Margarita Rana, MD  aspirin EC 81 MG tablet Take 81 mg by mouth daily.   Yes Historical Provider, MD  carvedilol (COREG) 6.25 MG tablet Take 1 tablet by mouth 2 (two) times daily. 03/26/15  Yes Historical Provider, MD  ferrous sulfate 325 (65 FE) MG tablet Take 325 mg by mouth 2 (two) times daily with a meal.   Yes Historical Provider, MD  furosemide (LASIX) 20 MG tablet Take 1 tablet (20 mg total) by mouth daily. 12/17/15  Yes Alisa Graff, FNP  glucose blood (TRUE METRIX BLOOD GLUCOSE TEST) test strip To check blood sugar once a day.  DX E11.9 03/08/16  Yes Margarita Rana, MD  metFORMIN (GLUCOPHAGE) 500 MG tablet Take 1 tablet (500 mg total) by mouth 2 (two) times daily with a meal. Patient taking differently: Take 500 mg by mouth 3 (three) times daily.  01/26/16  Yes Margarita Rana, MD  mirtazapine (REMERON) 15 MG tablet Take 1 tablet (15 mg total) by mouth at bedtime. Start with 1/2 tablet at night for one week, then increase to 1 tablet at bedtime. Patient taking differently: Take 15 mg by mouth at bedtime as needed. Start with 1/2 tablet at night for one week, then increase to 1 tablet at bedtime. 12/10/15  Yes Margarita Rana, MD  pantoprazole (PROTONIX) 40 MG tablet TAKE 1 TABLET EVERY DAY 08/24/15  Yes Margarita Rana, MD  Probiotic Product (PROBIOTIC DAILY) CAPS Take 1 capsule by mouth daily. 12/10/15  Yes Margarita Rana, MD  senna (SENOKOT) 8.6 MG TABS tablet Take 1 tablet by mouth daily as needed for mild constipation.   Yes Historical Provider, MD  spironolactone (ALDACTONE) 25 MG tablet Take 1 tablet (25 mg total) by mouth daily. 12/17/15  Yes Alisa Graff, FNP  SYNTHROID 50 MCG tablet TAKE 1 TABLET EVERY DAY 05/27/15  Yes Margarita Rana, MD  TRUEPLUS LANCETS 33G MISC To check blood sugar once a day.  DX: E11.9 03/08/16  Yes Margarita Rana, MD  sacubitril-valsartan (ENTRESTO) 24-26 MG Take  1 tablet by mouth 2 (two) times daily. 03/15/16   Alisa Graff, FNP      Review of Systems  Constitutional: Positive for fatigue. Negative for appetite change.  HENT: Negative for congestion, postnasal drip and sore throat.   Eyes: Negative.   Respiratory: Positive for cough (at times) and shortness of breath. Negative for chest tightness.   Cardiovascular: Positive for palpitations (at times) and leg swelling (little bit). Negative for chest pain.  Gastrointestinal: Negative for abdominal pain and abdominal distention.  Endocrine: Negative.   Genitourinary: Negative.   Musculoskeletal: Positive for neck pain (left side of the neck). Negative for back pain.  Skin: Negative.   Allergic/Immunologic: Negative.   Neurological: Positive for light-headedness. Negative for dizziness, vertigo, numbness and headaches.  Hematological: Negative for adenopathy. Does not bruise/bleed easily.  Psychiatric/Behavioral: Positive for dysphoric mood. Negative for sleep disturbance. The patient is not nervous/anxious.  Objective:   Physical Exam  Constitutional: She is oriented to person, place, and time. She appears well-developed and well-nourished.  HENT:  Head: Normocephalic and atraumatic.  Eyes: Conjunctivae are normal. Pupils are equal, round, and reactive to light.  Neck: Normal range of motion. Neck supple.  Cardiovascular: Normal rate and regular rhythm.   Pulmonary/Chest: Effort normal. She has no wheezes. She has no rales.  Abdominal: Soft. She exhibits no distension. There is no tenderness.  Musculoskeletal: She exhibits edema (trace amount pitting edema in bilateral ankles). She exhibits no tenderness.  Neurological: She is alert and oriented to person, place, and time.  Skin: Skin is warm and dry.  Psychiatric: She has a normal mood and affect. Her behavior is normal. Thought content normal.  Nursing note and vitals reviewed.   BP 105/58 mmHg  Pulse 77  Resp 18  Ht '5\' 1"'   (1.549 m)  Wt 134 lb (60.782 kg)  BMI 25.33 kg/m2  SpO2 100%  LMP  (LMP Unknown)       Assessment & Plan:  1: Chronic heart failure with reduced ejection fraction- Patient presents with continued fatigue and shortness of breath at times. She denied having any symptoms upon walking into the office this morning and when she does get short of breath, she'll stop what she's doing to rest until her breathing improves. She says that she continues to have a small amount of swelling in her ankles and admits that she doesn't elevate them at home. Encouraged her to elevate them when she can. She reports a gradual weight gain as her appetite has increased but upon reviewing her weight chart, there's no overnight weight gain of >2 pounds or weekly weight gain of >5 pounds. By our scale, she's gained 7 pounds since she was last here on 12/17/15.She is not adding any salt to her food but does admit to eating salty snacks at times like potato chips. Encouraged her to closely follow a 2066m sodium diet. Discussed changing her losartan to entresto and patient is agreeable to this. She usually takes her losartan at suppertime so she was instructed that she could take the losartan tonight and begin entresto 24/23mtwice daily beginning tomorrow or she could start the entresto tonight and take 1 tablet at supper and then begin twice daily tomorrow. Either way she was advised to NOT take the losartan at all when she begins taking the entresto. Patient verbalized understanding of these instructions and written instructions were also provided. Will plan on checking a basic metabolic panel at her next visit. [facility] and [facility] medications with the patient. 2: HTN- Blood pressure looks good today. 3: Diabetes- She has been started back on metformin and takes 50070mhree times daily. She says that her glucose this morning was 116 which is much better from the 200's that it was running in. Follows closely with her  PCP regarding this. 4: Neck pain- She says that she's had some pain on the left side of her neck which she attributes to her sleeping wrong on her pillow. Encouraged her to try some heat to loosen up the muscle.  Patient brought a medication list in for review.  Return in 2 weeks or sooner for any questions/problems before then.

## 2016-03-16 ENCOUNTER — Other Ambulatory Visit: Payer: Self-pay

## 2016-03-16 DIAGNOSIS — E119 Type 2 diabetes mellitus without complications: Secondary | ICD-10-CM

## 2016-03-16 MED ORDER — GLUCOSE BLOOD VI STRP: ORAL_STRIP | Status: DC

## 2016-03-22 ENCOUNTER — Encounter: Payer: Self-pay | Admitting: Internal Medicine

## 2016-03-22 ENCOUNTER — Ambulatory Visit (INDEPENDENT_AMBULATORY_CARE_PROVIDER_SITE_OTHER): Payer: Medicare PPO | Admitting: Internal Medicine

## 2016-03-22 VITALS — BP 96/52 | HR 86 | Ht 61.0 in | Wt 132.0 lb

## 2016-03-22 DIAGNOSIS — I5022 Chronic systolic (congestive) heart failure: Secondary | ICD-10-CM | POA: Diagnosis not present

## 2016-03-22 DIAGNOSIS — Z95810 Presence of automatic (implantable) cardiac defibrillator: Secondary | ICD-10-CM

## 2016-03-22 DIAGNOSIS — I447 Left bundle-branch block, unspecified: Secondary | ICD-10-CM

## 2016-03-22 DIAGNOSIS — I471 Supraventricular tachycardia: Secondary | ICD-10-CM

## 2016-03-22 DIAGNOSIS — I10 Essential (primary) hypertension: Secondary | ICD-10-CM | POA: Diagnosis not present

## 2016-03-22 NOTE — Patient Instructions (Signed)
Medication Instructions: - Move your evening doses of Coreg (carvedilol) & Entresto from supper time to bedtime  Labwork: - none  Procedures/Testing: - none  Follow-Up: - Remote monitoring is used to monitor your Pacemaker of ICD from home. This monitoring reduces the number of office visits required to check your device to one time per year. It allows [place] to keep an eye on the functioning of your device to ensure it is working properly. You are scheduled for a device check from home on 06/21/16. You may send your transmission at any time that day. If you have a wireless device, the transmission will be sent automatically. After your physician reviews your transmission, you will receive a postcard with your next transmission date.  - Your physician wants you to follow-up in: 1 year with Dr. Graciela Husbands. You will receive a reminder letter in the mail two months in advance. If you don't receive a letter, please call our office to schedule the follow-up appointment.   Any Additional Special Instructions Will Be Listed Below (If Applicable).     If you need a refill on your cardiac medications before your next appointment, please call your pharmacy.

## 2016-03-22 NOTE — Progress Notes (Signed)
ELECTROPHYSIOLOGY Clinic  NOTE  Patient ID: Bethany [name], MRN: [id number], DOB/AGE: 1946-04-06 70 y.o. Admit date: (Not on file) Date of Consult: 03/22/2016  Primary Physician: Margarita Rana, MD Primary Cardiologist: BK   Chief Complaint: Defibrillator   HPI Bethany [name] is a 70 y.o. female   in follow-up for CRT-D implantation 4/16.  She has a history of a nonischemic cardiomyopathy; she is undergoing catheterization 2012 demonstrating normal coronary arteries; echocardiogram at that point demonstrated ejection fraction of 20%. (This is all obtained from reviewing records from [facility] She also underwent echocardiogram 3/16 by Dr. Helane Gunther at [facility]. Ejection fraction was 15-20%. Repeat echocardiogram 3/17 demonstrated significant interval LV function improvement--35%  She has chronic systolic heart failure;   She's been able to tolerate guideline directed medical therapy following CRT implantation on carvedilol and losartan. Metabolic profile was obtained 3/17 was reviewed creatinine 0.8 potassium 4.0.    Notes from CHF clinic reviewed    Past Medical History  Diagnosis Date  . Diabetes ([place])   . Hypertension   . Hyperlipidemia   . Reflux   . Diverticulosis   . Thrombocytopenia ([place])   . CHF (congestive heart failure) ([place])   . LV dysfunction   . VHD (valvular heart disease)   . Dilated cardiomyopathy ([place])   . Thyroid disease   . Depression   . GERD (gastroesophageal reflux disease)   . Anxiety       Surgical History:  Past Surgical History  Procedure Laterality Date  . Cardiac catheterization    . Cholecystectomy    . Partial hysterectomy    . Bone marrow biopsy    . Bi-ventricular implantable cardioverter defibrillator [name] 02/11/2015    Procedure: BI-VENTRICULAR IMPLANTABLE CARDIOVERTER DEFIBRILLATOR  (CRT-D);  Surgeon: Deboraha Sprang, MD;  Location: [facility] CATH LAB;  Service: Cardiovascular;  Laterality: [name];  . Lead revision [name] 02/11/2015    Procedure: LEAD REVISION;   Surgeon: Deboraha Sprang, MD;  Location: [facility] CATH LAB;  Service: Cardiovascular;  Laterality: [name];  . Insert / replace / remove pacemaker    . Tooth extraction  09/23/2015     Home Meds: Prior to Admission medications   Medication Sig Start Date End Date Taking? Authorizing Provider  escitalopram (LEXAPRO) 10 MG tablet Take 1 tablet by mouth daily. 01/02/15  Yes Historical Provider, MD  furosemide (LASIX) 20 MG tablet Take 1 tablet by mouth daily. 09/22/14 09/22/15 Yes Historical Provider, MD  IRON PO Take 1 tablet by mouth daily.   Yes Historical Provider, MD  levothyroxine (SYNTHROID, LEVOTHROID) 50 MCG tablet Take 1 tablet by mouth daily.   Yes Historical Provider, MD  metFORMIN (GLUCOPHAGE) 500 MG tablet Take 1 tablet by mouth 2 (two) times daily. 06/12/14  Yes Historical Provider, MD  metoprolol tartrate (LOPRESSOR) 25 MG tablet Take 12.5 mg by mouth 2 (two) times daily.   Yes Historical Provider, MD  pantoprazole (PROTONIX) 40 MG tablet Take 1 tablet by mouth daily.   Yes Historical Provider, MD  spironolactone (ALDACTONE) 25 MG tablet Take 1 tablet by mouth 3 (three) times a week.   Yes Historical Provider, MD      Allergies:  Allergies  Allergen Reactions  . Other Other (See Comments)    NO BLOOD PRODUCTS- JEHOVAHS WITNESS  . Sulfa Antibiotics Other (See Comments)    Loss of taste    Social History   Social History  . Marital Status: Divorced    Spouse Name: [name]  . Number of  Children: 4  . Years of Education: [name]   Occupational History  . retired    Social History Main Topics  . Smoking status: Never Smoker   . Smokeless tobacco: Never Used  . Alcohol Use: No  . Drug Use: No  . Sexual Activity: Not on file   Other Topics Concern  . Not on file   Social History Narrative     Family History  Problem Relation Age of Onset  . Heart disease Mother   . Heart attack Mother   . Prostate cancer Father   . Ulcers Father   . Stroke Sister   . Heart attack Sister     . Diabetes Sister   . Heart attack Brother   . Stroke Brother      ROS:  Please see the history of present illness.     All other systems reviewed and negative.    Physical Exam:   Blood pressure 96/52, pulse 86, height '5\' 1"'  (1.549 m), weight 132 lb (59.875 kg). General: Well developed, well nourished female in no acute distress. Head: Normocephalic, atraumatic, sclera non-icteric, no xanthomas, nares are without discharge. EENT: normal Lymph Nodes:  none Back: without scoliosis/kyphosis , no CVA tendersness Neck: Negative for carotid bruits. JVD not elevated. Device pocket well healed; without hematoma or erythema.  There is no tethering  Lungs: Clear bilaterally to auscultation without wheezes, rales, or rhonchi. Breathing is unlabored. Heart: RRR with S1 S2.   Early 2/6 systolic murmur , rubs, or gallops appreciated. Abdomen: Soft, non-tender, non-distended with normoactive bowel sounds. No hepatomegaly. No rebound/guarding. No obvious abdominal masses. Msk:  Strength and tone appear normal for age. Extremities: No clubbing or cyanosis. No  edema.  Distal pedal pulses are 2+ and equal bilaterally. Skin: Warm and Dry Neuro: Alert and oriented X 3. CN III-XII intact Grossly normal sensory and motor function . Psych:  Responds to questions appropriately with a normal affect.      Labs: Cardiac Enzymes No results for input(s): CKTOTAL, CKMB, TROPONINI in the last 72 hours. CBC   Miscellaneous No results found for: DDIMER  Radiology/Studies:  No results found.  EKG:  NSR    P synchronous pacing   intervals 14/12/41  Axis left 266    Assessment and Plan:   NICM  CHF class 2B3A  LBBB  Hypotension  CRT D implanted 4/16   We will move her nighttime doses of carvedilol and Entresto to that time to hopefully obviate some of her postprandial evening dizziness  Euvolemic continue current meds   Virl Axe

## 2016-04-01 ENCOUNTER — Encounter: Payer: Self-pay | Admitting: Family

## 2016-04-01 ENCOUNTER — Ambulatory Visit: Payer: Medicare PPO | Attending: Family | Admitting: Family

## 2016-04-01 VITALS — BP 111/66 | HR 86 | Resp 18 | Ht 61.0 in | Wt 131.0 lb

## 2016-04-01 DIAGNOSIS — Z882 Allergy status to sulfonamides status: Secondary | ICD-10-CM | POA: Diagnosis not present

## 2016-04-01 DIAGNOSIS — E079 Disorder of thyroid, unspecified: Secondary | ICD-10-CM | POA: Insufficient documentation

## 2016-04-01 DIAGNOSIS — Z79899 Other long term (current) drug therapy: Secondary | ICD-10-CM | POA: Insufficient documentation

## 2016-04-01 DIAGNOSIS — E785 Hyperlipidemia, unspecified: Secondary | ICD-10-CM | POA: Insufficient documentation

## 2016-04-01 DIAGNOSIS — Z8249 Family history of ischemic heart disease and other diseases of the circulatory system: Secondary | ICD-10-CM | POA: Insufficient documentation

## 2016-04-01 DIAGNOSIS — Z823 Family history of stroke: Secondary | ICD-10-CM | POA: Diagnosis not present

## 2016-04-01 DIAGNOSIS — I5022 Chronic systolic (congestive) heart failure: Secondary | ICD-10-CM | POA: Insufficient documentation

## 2016-04-01 DIAGNOSIS — R0602 Shortness of breath: Secondary | ICD-10-CM | POA: Diagnosis not present

## 2016-04-01 DIAGNOSIS — Z9049 Acquired absence of other specified parts of digestive tract: Secondary | ICD-10-CM | POA: Diagnosis not present

## 2016-04-01 DIAGNOSIS — K219 Gastro-esophageal reflux disease without esophagitis: Secondary | ICD-10-CM | POA: Insufficient documentation

## 2016-04-01 DIAGNOSIS — I420 Dilated cardiomyopathy: Secondary | ICD-10-CM | POA: Insufficient documentation

## 2016-04-01 DIAGNOSIS — D696 Thrombocytopenia, unspecified: Secondary | ICD-10-CM | POA: Insufficient documentation

## 2016-04-01 DIAGNOSIS — I10 Essential (primary) hypertension: Secondary | ICD-10-CM | POA: Diagnosis not present

## 2016-04-01 DIAGNOSIS — R5383 Other fatigue: Secondary | ICD-10-CM | POA: Insufficient documentation

## 2016-04-01 DIAGNOSIS — F329 Major depressive disorder, single episode, unspecified: Secondary | ICD-10-CM | POA: Insufficient documentation

## 2016-04-01 DIAGNOSIS — Z90711 Acquired absence of uterus with remaining cervical stump: Secondary | ICD-10-CM | POA: Insufficient documentation

## 2016-04-01 DIAGNOSIS — E119 Type 2 diabetes mellitus without complications: Secondary | ICD-10-CM | POA: Insufficient documentation

## 2016-04-01 DIAGNOSIS — Z7982 Long term (current) use of aspirin: Secondary | ICD-10-CM | POA: Insufficient documentation

## 2016-04-01 DIAGNOSIS — Z7984 Long term (current) use of oral hypoglycemic drugs: Secondary | ICD-10-CM | POA: Insufficient documentation

## 2016-04-01 DIAGNOSIS — K5790 Diverticulosis of intestine, part unspecified, without perforation or abscess without bleeding: Secondary | ICD-10-CM | POA: Diagnosis not present

## 2016-04-01 LAB — BASIC METABOLIC PANEL
Anion gap: 8 (ref 5–15)
BUN: 11 mg/dL (ref 6–20)
CO2: 28 mmol/L (ref 22–32)
CREATININE: 0.78 mg/dL (ref 0.44–1.00)
Calcium: 9.7 mg/dL (ref 8.9–10.3)
Chloride: 102 mmol/L (ref 101–111)
GFR calc non Af Amer: >60 mL/min (ref >60)
Glucose, Bld: 146 mg/dL — ABNORMAL HIGH (ref 65–99)
Potassium: 3.9 mmol/L (ref 3.5–5.1)
Sodium: 138 mmol/L (ref 135–145)

## 2016-04-01 NOTE — Patient Instructions (Signed)
Continue weighing daily and call for an overnight weight gain of > 2 pounds or a weekly weight gain of >5 pounds. 

## 2016-04-01 NOTE — Progress Notes (Signed)
Subjective:    Patient ID: Rolene Course, female    DOB: Oct 28, 1946, 70 y.o.   MRN: [id number]  Congestive Heart Failure Presents for follow-up visit. The disease course has been stable. Associated symptoms include edema (little bit), fatigue and shortness of breath. Pertinent negatives include no abdominal pain, chest pain, chest pressure, orthopnea or palpitations. The symptoms have been stable. Past treatments include angiotensin receptor blockers, aldosterone receptor blockers, beta blockers and salt and fluid restriction. The treatment provided moderate relief. Compliance with prior treatments has been good. Her past medical history is significant for DM, HTN and valvular heart disease. There is no history of CAD or CVA. She has multiple 1st degree relatives with heart disease.  Hypertension This is a chronic problem. The current episode started more than 1 year ago. The problem is unchanged. The problem is controlled. Associated symptoms include peripheral edema and shortness of breath. Pertinent negatives include no chest pain, headaches, neck pain or palpitations. There are no associated agents to hypertension. Risk factors for coronary artery disease include diabetes mellitus, dyslipidemia, family history and post-menopausal state. Past treatments include angiotensin blockers, beta blockers, diuretics and lifestyle changes. The current treatment provides significant improvement. There are no compliance problems.  Hypertensive end-organ damage includes heart failure.   Past Medical History  Diagnosis Date  . Diabetes ([place])   . Hypertension   . Hyperlipidemia   . Reflux   . Diverticulosis   . Thrombocytopenia ([place])   . CHF (congestive heart failure) ([place])   . LV dysfunction   . VHD (valvular heart disease)   . Dilated cardiomyopathy ([place])   . Thyroid disease   . Depression   . GERD (gastroesophageal reflux disease)   . Anxiety   ' Past Surgical History  Procedure Laterality Date   . Cardiac catheterization    . Cholecystectomy    . Partial hysterectomy    . Bone marrow biopsy    . Bi-ventricular implantable cardioverter defibrillator [name] 02/11/2015    Procedure: BI-VENTRICULAR IMPLANTABLE CARDIOVERTER DEFIBRILLATOR  (CRT-D);  Surgeon: Deboraha Sprang, MD;  Location: [facility] CATH LAB;  Service: Cardiovascular;  Laterality: [name];  . Lead revision [name] 02/11/2015    Procedure: LEAD REVISION;  Surgeon: Deboraha Sprang, MD;  Location: [facility] CATH LAB;  Service: Cardiovascular;  Laterality: [name];  . Insert / replace / remove pacemaker    . Tooth extraction  09/23/2015    Family History  Problem Relation Age of Onset  . Heart disease Mother   . Heart attack Mother   . Prostate cancer Father   . Ulcers Father   . Stroke Sister   . Heart attack Sister   . Diabetes Sister   . Heart attack Brother   . Stroke Brother     Social History  Substance Use Topics  . Smoking status: Never Smoker   . Smokeless tobacco: Never Used  . Alcohol Use: No    Allergies  Allergen Reactions  . Other Other (See Comments)    NO BLOOD PRODUCTS- JEHOVAHS WITNESS  . Sulfa Antibiotics Other (See Comments)    Loss of taste    Prior to Admission medications   Medication Sig Start Date End Date Taking? Authorizing Provider  acetaminophen (TYLENOL) 500 MG tablet Take 1,000 mg by mouth 2 (two) times daily as needed for mild pain or moderate pain.   Yes Historical Provider, MD  Alcohol Swabs (B-D SINGLE USE SWABS REGULAR) PADS Use to check blood sugar once a day. 03/08/16  Yes Margarita Rana, MD  ALPRAZolam Duanne Moron) 0.5 MG tablet Take 1 tablet (0.5 mg total) by mouth at bedtime as needed for anxiety. 05/13/15  Yes Margarita Rana, MD  aspirin EC 81 MG tablet Take 81 mg by mouth daily.   Yes Historical Provider, MD  carvedilol (COREG) 6.25 MG tablet Take 1 tablet by mouth 2 (two) times daily. 03/26/15  Yes Historical Provider, MD  ferrous sulfate 325 (65 FE) MG tablet Take 325 mg by mouth 2 (two) times daily with  a meal.   Yes Historical Provider, MD  furosemide (LASIX) 20 MG tablet Take 1 tablet (20 mg total) by mouth daily. 12/17/15  Yes Alisa Graff, FNP  glucose blood (TRUE METRIX BLOOD GLUCOSE TEST) test strip To check blood sugar once a day.  DX E11.9 03/16/16  Yes Margarita Rana, MD  metFORMIN (GLUCOPHAGE) 1000 MG tablet Take 1 tablet (1,000 mg total) by mouth 2 (two) times daily with a meal. 03/15/16  Yes Margarita Rana, MD  mirtazapine (REMERON) 15 MG tablet Take 1 tablet (15 mg total) by mouth at bedtime. Start with 1/2 tablet at night for one week, then increase to 1 tablet at bedtime. Patient taking differently: Take 15 mg by mouth at bedtime as needed. Start with 1/2 tablet at night for one week, then increase to 1 tablet at bedtime. 12/10/15  Yes Margarita Rana, MD  pantoprazole (PROTONIX) 40 MG tablet TAKE 1 TABLET EVERY DAY 08/24/15  Yes Margarita Rana, MD  Probiotic Product (PROBIOTIC DAILY) CAPS Take 1 capsule by mouth daily. 12/10/15  Yes Margarita Rana, MD  sacubitril-valsartan (ENTRESTO) 24-26 MG Take 1 tablet by mouth 2 (two) times daily. 03/15/16  Yes Alisa Graff, FNP  senna (SENOKOT) 8.6 MG TABS tablet Take 1 tablet by mouth daily as needed for mild constipation.   Yes Historical Provider, MD  sitaGLIPtin (JANUVIA) 100 MG tablet Take 1 tablet (100 mg total) by mouth daily. 03/15/16  Yes Margarita Rana, MD  spironolactone (ALDACTONE) 25 MG tablet Take 1 tablet (25 mg total) by mouth daily. 12/17/15  Yes Alisa Graff, FNP  SYNTHROID 50 MCG tablet TAKE 1 TABLET EVERY DAY 05/27/15  Yes Margarita Rana, MD  TRUEPLUS LANCETS 33G MISC To check blood sugar once a day.  DX: E11.9 03/08/16  Yes Margarita Rana, MD      Review of Systems  Constitutional: Positive for appetite change (decreased) and fatigue.  HENT: Negative for congestion, postnasal drip and sore throat.   Eyes: Negative.   Respiratory: Positive for cough (dry cough) and shortness of breath.   Cardiovascular: Negative for chest pain and  palpitations. Leg swelling: very little.  Gastrointestinal: Negative for abdominal pain and abdominal distention.  Endocrine: Negative.   Genitourinary: Negative.   Musculoskeletal: Positive for arthralgias (both legs ache especially at night). Negative for back pain and neck pain.  Skin: Negative.   Allergic/Immunologic: Negative.   Neurological: Positive for light-headedness. Negative for dizziness and headaches.  Hematological: Negative for adenopathy. Does not bruise/bleed easily.  Psychiatric/Behavioral: Positive for dysphoric mood. Negative for suicidal ideas and sleep disturbance (sleeping on 3 pillows). The patient is nervous/anxious (due to grandson alcohol use).        Objective:   Physical Exam  Constitutional: She is oriented to person, place, and time. She appears well-developed and well-nourished.  HENT:  Head: Normocephalic and atraumatic.  Eyes: Conjunctivae are normal. Pupils are equal, round, and reactive to light.  Neck: Normal range of motion. Neck supple.  Cardiovascular: Normal  rate and regular rhythm.   Pulmonary/Chest: Effort normal. She has no wheezes. She has no rales.  Abdominal: Soft. She exhibits no distension. There is no tenderness.  Musculoskeletal: She exhibits no edema or tenderness.  Neurological: She is alert and oriented to person, place, and time.  Skin: Skin is warm and dry.  Psychiatric: She has a normal mood and affect. Her behavior is normal. Thought content normal.  Nursing note and vitals reviewed.  BP 111/66 mmHg  Pulse 86  Resp 18  Ht '5\' 1"'  (1.549 m)  Wt 131 lb (59.421 kg)  BMI 24.76 kg/m2  SpO2 100%  LMP  (LMP Unknown)        Assessment & Plan:  1: Chronic heart failure with reduced ejection fraction- Patient presents with continued fatigue along with some shortness of breath. She did say that she was short of breath upon walking into the office but once she sat down for a few minutes, her breathing improved. (Class II). She  reports occasional swelling in her ankles which completely resolves overnight. Encouraged her to elevate her legs at home when she is sitting for long periods of time. She is not adding any salt to her food and tries to eat low sodium foods. She continues to weigh herself daily and says that her weight has been stable. By our scale, she has lost 3 pounds since she was last here on 03/15/16.Delene Loll was started at her last visit so will check a basic metabolic panel today. 2: HTN- Blood pressure is on the low side but is stable at this time.  3: Diabetes- She has been started on januvia since she was last here and says that her glucose this morning was 100. Follows closely with PCP regarding her diabetes.   Patient did not bring her medications nor a list. Each medication was verbally reviewed with the patient and she was encouraged to bring the bottles to every visit to confirm accuracy of list.  Return here in 3 months or sooner for any questions/problems before then.   Prior to note being finished, her lab work returned and showed a normal renal function and potassium level.

## 2016-04-05 ENCOUNTER — Other Ambulatory Visit: Payer: Medicare PPO

## 2016-04-05 ENCOUNTER — Inpatient Hospital Stay: Payer: Medicare PPO | Admitting: Family Medicine

## 2016-04-13 ENCOUNTER — Ambulatory Visit (INDEPENDENT_AMBULATORY_CARE_PROVIDER_SITE_OTHER): Payer: Medicare PPO | Admitting: Family Medicine

## 2016-04-13 ENCOUNTER — Encounter: Payer: Self-pay | Admitting: Family Medicine

## 2016-04-13 ENCOUNTER — Inpatient Hospital Stay: Payer: Medicare PPO | Attending: Family Medicine

## 2016-04-13 ENCOUNTER — Ambulatory Visit
Admission: RE | Admit: 2016-04-13 | Discharge: 2016-04-13 | Disposition: A | Discharge status: Home / Self Care | Payer: Medicare PPO | Source: Ambulatory Visit | Attending: Family Medicine | Admitting: Family Medicine

## 2016-04-13 ENCOUNTER — Encounter: Payer: Self-pay | Admitting: *Deleted

## 2016-04-13 ENCOUNTER — Inpatient Hospital Stay (HOSPITAL_BASED_OUTPATIENT_CLINIC_OR_DEPARTMENT_OTHER): Payer: Medicare PPO | Admitting: Family Medicine

## 2016-04-13 VITALS — BP 90/60 | HR 85 | Temp 98.2°F | Resp 16 | Wt 132.0 lb

## 2016-04-13 VITALS — BP 101/62 | HR 90 | Temp 96.9°F | Wt 131.2 lb

## 2016-04-13 DIAGNOSIS — E538 Deficiency of other specified B group vitamins: Secondary | ICD-10-CM

## 2016-04-13 DIAGNOSIS — I420 Dilated cardiomyopathy: Secondary | ICD-10-CM | POA: Diagnosis not present

## 2016-04-13 DIAGNOSIS — I509 Heart failure, unspecified: Secondary | ICD-10-CM

## 2016-04-13 DIAGNOSIS — R194 Change in bowel habit: Secondary | ICD-10-CM

## 2016-04-13 DIAGNOSIS — D709 Neutropenia, unspecified: Secondary | ICD-10-CM | POA: Insufficient documentation

## 2016-04-13 DIAGNOSIS — R1013 Epigastric pain: Secondary | ICD-10-CM | POA: Insufficient documentation

## 2016-04-13 DIAGNOSIS — E119 Type 2 diabetes mellitus without complications: Secondary | ICD-10-CM | POA: Insufficient documentation

## 2016-04-13 DIAGNOSIS — K219 Gastro-esophageal reflux disease without esophagitis: Secondary | ICD-10-CM | POA: Insufficient documentation

## 2016-04-13 DIAGNOSIS — E039 Hypothyroidism, unspecified: Secondary | ICD-10-CM | POA: Diagnosis not present

## 2016-04-13 DIAGNOSIS — E876 Hypokalemia: Secondary | ICD-10-CM

## 2016-04-13 DIAGNOSIS — D72819 Decreased white blood cell count, unspecified: Secondary | ICD-10-CM | POA: Diagnosis not present

## 2016-04-13 DIAGNOSIS — I38 Endocarditis, valve unspecified: Secondary | ICD-10-CM | POA: Diagnosis not present

## 2016-04-13 DIAGNOSIS — R110 Nausea: Secondary | ICD-10-CM

## 2016-04-13 DIAGNOSIS — K589 Irritable bowel syndrome without diarrhea: Secondary | ICD-10-CM | POA: Diagnosis not present

## 2016-04-13 DIAGNOSIS — Z7984 Long term (current) use of oral hypoglycemic drugs: Secondary | ICD-10-CM

## 2016-04-13 DIAGNOSIS — Z8042 Family history of malignant neoplasm of prostate: Secondary | ICD-10-CM | POA: Diagnosis not present

## 2016-04-13 DIAGNOSIS — R935 Abnormal findings on diagnostic imaging of other abdominal regions, including retroperitoneum: Secondary | ICD-10-CM | POA: Insufficient documentation

## 2016-04-13 DIAGNOSIS — F419 Anxiety disorder, unspecified: Secondary | ICD-10-CM

## 2016-04-13 DIAGNOSIS — F329 Major depressive disorder, single episode, unspecified: Secondary | ICD-10-CM | POA: Diagnosis not present

## 2016-04-13 DIAGNOSIS — E785 Hyperlipidemia, unspecified: Secondary | ICD-10-CM | POA: Insufficient documentation

## 2016-04-13 DIAGNOSIS — D696 Thrombocytopenia, unspecified: Secondary | ICD-10-CM | POA: Insufficient documentation

## 2016-04-13 DIAGNOSIS — R0602 Shortness of breath: Secondary | ICD-10-CM | POA: Diagnosis not present

## 2016-04-13 DIAGNOSIS — Z7982 Long term (current) use of aspirin: Secondary | ICD-10-CM | POA: Insufficient documentation

## 2016-04-13 LAB — CBC WITH DIFFERENTIAL/PLATELET
Basophils Absolute: 0 10*3/uL (ref 0–0.1)
Basophils Relative: 0 %
Eosinophils Absolute: 0.1 10*3/uL (ref 0–0.7)
Eosinophils Relative: 1 %
HEMATOCRIT: 34 % — AB (ref 35.0–47.0)
HEMOGLOBIN: 11.6 g/dL — AB (ref 12.0–16.0)
LYMPHS ABS: 1.7 10*3/uL (ref 1.0–3.6)
LYMPHS PCT: 46 %
MCH: 30 pg (ref 26.0–34.0)
MCHC: 34 g/dL (ref 32.0–36.0)
MCV: 88.2 fL (ref 80.0–100.0)
Monocytes Absolute: 0.3 10*3/uL (ref 0.2–0.9)
Monocytes Relative: 8 %
NEUTROS ABS: 1.7 10*3/uL (ref 1.4–6.5)
NEUTROS PCT: 45 %
Platelets: 120 10*3/uL — ABNORMAL LOW (ref 150–440)
RBC: 3.85 MIL/uL (ref 3.80–5.20)
RDW: 12.6 % (ref 11.5–14.5)
WBC: 3.8 10*3/uL (ref 3.6–11.0)

## 2016-04-13 LAB — CUP PACEART INCLINIC DEVICE CHECK
Battery Remaining Longevity: 65 mo
Battery Voltage: 2.94 V
Brady Statistic RA Percent Paced: 0.1 %
HighPow Impedance: 58 Ohm
Implantable Lead Implant Date: 20160409
Implantable Lead Implant Date: 20160409
Implantable Lead Implant Date: 20160409
Implantable Lead Location: 753858
Implantable Lead Location: 753859
Implantable Lead Location: 753860
Implantable Lead Model: 4598
Lead Channel Impedance Value: 1026 Ohm
Lead Channel Impedance Value: 361 Ohm
Lead Channel Impedance Value: 399 Ohm
Lead Channel Impedance Value: 456 Ohm
Lead Channel Impedance Value: 513 Ohm
Lead Channel Impedance Value: 513 Ohm
Lead Channel Impedance Value: 703 Ohm
Lead Channel Impedance Value: 836 Ohm
Lead Channel Impedance Value: 874 Ohm
Lead Channel Pacing Threshold Amplitude: 0.5 V
Lead Channel Pacing Threshold Amplitude: 1.75 V
Lead Channel Pacing Threshold Pulse Width: 0.4 ms
Lead Channel Pacing Threshold Pulse Width: 0.8 ms
Lead Channel Sensing Intrinsic Amplitude: 1 mV
Lead Channel Sensing Intrinsic Amplitude: 10.25 mV
Lead Channel Setting Pacing Amplitude: 1.5 V
Lead Channel Setting Pacing Amplitude: 3 V
Lead Channel Setting Pacing Pulse Width: 0.8 ms
MDC IDC MSMT LEADCHNL LV IMPEDANCE VALUE: 532 Ohm
MDC IDC MSMT LEADCHNL LV IMPEDANCE VALUE: 874 Ohm
MDC IDC MSMT LEADCHNL LV IMPEDANCE VALUE: 988 Ohm
MDC IDC MSMT LEADCHNL LV IMPEDANCE VALUE: 988 Ohm
MDC IDC MSMT LEADCHNL RA PACING THRESHOLD PULSEWIDTH: 0.4 ms
MDC IDC MSMT LEADCHNL RV PACING THRESHOLD AMPLITUDE: 1 V
MDC IDC SESS DTM: 20170516144520
MDC IDC SET LEADCHNL RV SENSING SENSITIVITY: 0.3 mV
MDC IDC STAT BRADY AP VP PERCENT: 0.08 %
MDC IDC STAT BRADY AP VS PERCENT: 0.02 %
MDC IDC STAT BRADY AS VP PERCENT: 98.56 %
MDC IDC STAT BRADY AS VS PERCENT: 1.34 %
MDC IDC STAT BRADY RV PERCENT PACED: 7.7 %

## 2016-04-13 NOTE — Progress Notes (Signed)
Patient: Bethany [name] Female    DOB: 11-19-1945   70 y.o.   MRN: [id number] Visit Date: 04/13/2016  Today's Provider: Lelon Huh, MD   Chief Complaint  Patient presents with  . Nausea   Subjective:    HPI  Nausea: Patient presents complaining of intermittent Nausea and chills for 3-4 weeks. Other associated symptoms include decreased  Appetite, night sweats, abdominal bloating and pain in lower legs that radiates into her upper abdomen. Patient states she feels full after eating a couple of bites of food or drinking small sips of water. Patient states her stool is coming out in pieces as if it is not being digested. Patient has had some abdominal pain off and on.      Allergies  Allergen Reactions  . Other Other (See Comments)    NO BLOOD PRODUCTS- JEHOVAHS WITNESS  . Sulfa Antibiotics Other (See Comments)    Loss of taste   Current Meds  Medication Sig  . acetaminophen (TYLENOL) 500 MG tablet Take 1,000 mg by mouth 2 (two) times daily as needed for mild pain or moderate pain.  . Alcohol Swabs (B-D SINGLE USE SWABS REGULAR) PADS Use to check blood sugar once a day.  . ALPRAZolam (XANAX) 0.5 MG tablet Take 1 tablet (0.5 mg total) by mouth at bedtime as needed for anxiety.  Marland Kitchen aspirin EC 81 MG tablet Take 81 mg by mouth daily.  . carvedilol (COREG) 6.25 MG tablet Take 1 tablet by mouth 2 (two) times daily.  . ferrous sulfate 325 (65 FE) MG tablet Take 325 mg by mouth 2 (two) times daily with a meal.  . furosemide (LASIX) 20 MG tablet Take 1 tablet (20 mg total) by mouth daily.  Marland Kitchen glucose blood (TRUE METRIX BLOOD GLUCOSE TEST) test strip To check blood sugar once a day.  DX E11.9  . metFORMIN (GLUCOPHAGE) 1000 MG tablet Take 1 tablet (1,000 mg total) by mouth 2 (two) times daily with a meal.  . mirtazapine (REMERON) 15 MG tablet Take 1 tablet (15 mg total) by mouth at bedtime. Start with 1/2 tablet at night for one week, then increase to 1 tablet at bedtime. (Patient  taking differently: Take 15 mg by mouth at bedtime as needed. Start with 1/2 tablet at night for one week, then increase to 1 tablet at bedtime.)  . pantoprazole (PROTONIX) 40 MG tablet TAKE 1 TABLET EVERY DAY  . Probiotic Product (PROBIOTIC DAILY) CAPS Take 1 capsule by mouth daily.  . sacubitril-valsartan (ENTRESTO) 24-26 MG Take 1 tablet by mouth 2 (two) times daily.  Marland Kitchen senna (SENOKOT) 8.6 MG TABS tablet Take 1 tablet by mouth daily as needed for mild constipation.  . sitaGLIPtin (JANUVIA) 100 MG tablet Take 1 tablet (100 mg total) by mouth daily.  Marland Kitchen spironolactone (ALDACTONE) 25 MG tablet Take 1 tablet (25 mg total) by mouth daily.  Marland Kitchen SYNTHROID 50 MCG tablet TAKE 1 TABLET EVERY DAY  . TRUEPLUS LANCETS 33G MISC To check blood sugar once a day.  DX: E11.9   Past Medical History  Diagnosis Date  . Diabetes ([place])   . Hypertension   . Hyperlipidemia   . Reflux   . Diverticulosis   . Thrombocytopenia ([place])   . CHF (congestive heart failure) ([place])   . LV dysfunction   . VHD (valvular heart disease)   . Dilated cardiomyopathy ([place])   . Thyroid disease   . Depression   . GERD (gastroesophageal reflux disease)   .  Anxiety    Past Surgical History  Procedure Laterality Date  . Cardiac catheterization    . Cholecystectomy    . Partial hysterectomy    . Bone marrow biopsy    . Bi-ventricular implantable cardioverter defibrillator [name] 02/11/2015    Procedure: BI-VENTRICULAR IMPLANTABLE CARDIOVERTER DEFIBRILLATOR  (CRT-D);  Surgeon: Deboraha Sprang, MD;  Location: [facility] CATH LAB;  Service: Cardiovascular;  Laterality: [name];  . Lead revision [name] 02/11/2015    Procedure: LEAD REVISION;  Surgeon: Deboraha Sprang, MD;  Location: [facility] CATH LAB;  Service: Cardiovascular;  Laterality: [name];  . Insert / replace / remove pacemaker    . Tooth extraction  09/23/2015    Review of Systems  Constitutional: Positive for chills, diaphoresis, appetite change and fatigue. Negative for fever.  Respiratory: Negative  for chest tightness and shortness of breath.   Cardiovascular: Negative for chest pain and palpitations.  Gastrointestinal: Positive for nausea, abdominal pain, diarrhea and abdominal distention. Negative for vomiting, constipation, blood in stool and anal bleeding.  Genitourinary: Negative for dysuria and frequency.  Neurological: Positive for weakness and light-headedness (off balance).    Social History  Substance Use Topics  . Smoking status: Never Smoker   . Smokeless tobacco: Never Used  . Alcohol Use: No   Objective:   BP 90/60 mmHg  Pulse 85  Temp(Src) 98.2 F (36.8 C) (Oral)  Resp 16  Wt 132 lb (59.875 kg)  SpO2 97%  LMP  (LMP Unknown)  Physical Exam  General Appearance:    Alert, cooperative, no distress  Eyes:    PERRL, conjunctiva/corneas clear, EOM's intact       Lungs:     Clear to auscultation bilaterally, respirations unlabored  Heart:    Regular rate and rhythm  Abdomen:   soft, round, mild epigastric and RUQ tenderness. Hypoactive bowel sounds. No rebound, no guarding No CVA tenderness        Assessment & Plan:     1. Nausea  - CBC - Comprehensive metabolic panel - Amylase - DG Abd 2 Views; Future  2. Epigastric pain  - CBC - Comprehensive metabolic panel - Amylase - DG Abd 2 Views; Future  3. Bowel habit changes - DG Abd 2 Views; Future      The entirety of the information documented in the History of Present Illness, Review of Systems and Physical Exam were personally obtained by me. Portions of this information were initially documented by Meyer Cory, CMA and reviewed by me for thoroughness and accuracy.    Lelon Huh, MD  [facility]

## 2016-04-13 NOTE — Progress Notes (Signed)
[place]  [phone] [phone]     ID: Bethany [name] OB: [birth year]  MR#: [id number]  EUM#:[id number]  Patient Care Team: Margarita Rana, MD as PCP - General (Family Medicine) Corey Skains, MD as Consulting Physician (Cardiology) Deboraha Sprang, MD as Consulting Physician (Cardiology) Alisa Graff, FNP as Nurse Practitioner (Family Medicine)  CHIEF COMPLAINT/DIAGNOSIS:  Leukopenia/Neutropenia along with persistent Thrombocytopenia -   etiology unclear, ? early myelodysplasia versus other etiology. Bone marrow biopsy 04/16/14 - no significant dysplasia or overt neoplasia identified. Normocellular marrow for age cellularity ~40% with erythroid predominant maturing trilineage hematopoiesis, mild nonspecific dyserythropoiesis and adequate megakaryocytes. Diffuse mild reticulin fibrosis (grade 1/3). Decreased iron stores. Flow cytometry unremarkable. Cytogenetics unremarkable, 46XX. SNP microarray diagnosis Normal female arr(1-22,X)x2. Workup done on 04/07/14 remarkable for WBC 2800, 41% neutrophils, 40% lymphs, teardrop cells, Hb 12.7, platelet clumps (Otherwise retic, Direct platelet Ab, iron study, folate, SIEP, random-UIEP, LDH, haptoglobin, PT, PTT, HBsAg, HCV Ab, HIV Ab, peripheral blood Flow Cytometry all unremarkable).  HISTORY OF PRESENT ILLNESS:  Patient returns for hematology followup, she was seen one year ago. States she is doing the same. Previous bone marrow biopsy does not reveal any obvious myelodysplasia but did comment as mild nonspecific dyserythropoiesis and decreased iron stores. No fevers or symptoms of infection. Appetite is good. Denies new bone pains. No skin rash or bleeding symptoms.  REVIEW OF SYSTEMS:   Review of Systems  Constitutional: Negative for fever, chills, weight loss, malaise/fatigue and diaphoresis.  HENT: Negative.   Eyes: Negative.   Respiratory: Negative for cough, hemoptysis, sputum production, shortness of  breath and wheezing.   Cardiovascular: Negative for chest pain, palpitations, orthopnea, claudication, leg swelling and PND.  Gastrointestinal: Negative for heartburn, nausea, vomiting, abdominal pain, diarrhea, constipation, blood in stool and melena.  Genitourinary: Negative.   Musculoskeletal: Negative.   Skin: Negative.   Neurological: Negative for dizziness, tingling, focal weakness, seizures and weakness.  Endo/Heme/Allergies: Does not bruise/bleed easily.  Psychiatric/Behavioral: Negative for depression. The patient is not nervous/anxious and does not have insomnia.    As in HPI above. In addition, no fever, chills or sweats. No new headaches or focal weakness.  No new sore throat, cough, shortness of breath, sputum, hemoptysis or chest pain. No dizziness or palpitation. No abdominal pain, constipation, diarrhea, dysuria or hematuria. No new skin rash or bleeding symptoms. No new paresthesias in extremities.   PAST MEDICAL HISTORY: Reviewed. Past Medical History  Diagnosis Date  . Diabetes ([place])   . Hypertension   . Hyperlipidemia   . Reflux   . Diverticulosis   . Thrombocytopenia ([place])   . CHF (congestive heart failure) ([place])   . LV dysfunction   . VHD (valvular heart disease)   . Dilated cardiomyopathy ([place])   . Thyroid disease   . Depression   . GERD (gastroesophageal reflux disease)   . Anxiety          Type 2 diabetes mellitus  Hyperlipidemia  Hypothyroidism  Depression  Allergic rhinitis  Chronic dyspnea on exertion  Diverticulitis  Vitamin B12 deficiency  Thrombocytopenia  GERD  Hypokalemia  Anemia  Irritable bowel syndrome  Right breast biopsy, denies malignancy  Arthritis  Partial hysterectomy  Cholecystectomy  Cataracts  PAST SURGICAL HISTORY: Reviewed. Past Surgical History  Procedure Laterality Date  . Cardiac catheterization    . Cholecystectomy    . Partial hysterectomy    . Bone marrow biopsy    . Bi-ventricular implantable cardioverter  defibrillator [name]  02/11/2015    Procedure: BI-VENTRICULAR IMPLANTABLE CARDIOVERTER DEFIBRILLATOR  (CRT-D);  Surgeon: Deboraha Sprang, MD;  Location: [facility] CATH LAB;  Service: Cardiovascular;  Laterality: [name];  . Lead revision [name] 02/11/2015    Procedure: LEAD REVISION;  Surgeon: Deboraha Sprang, MD;  Location: [facility] CATH LAB;  Service: Cardiovascular;  Laterality: [name];  . Insert / replace / remove pacemaker    . Tooth extraction  09/23/2015    FAMILY HISTORY: Reviewed. Family History  Problem Relation Age of Onset  . Heart disease Mother   . Heart attack Mother   . Prostate cancer Father   . Ulcers Father   . Stroke Sister   . Heart attack Sister   . Diabetes Sister   . Heart attack Brother   . Stroke Brother   Remarkable for diabetes, heart disease, anemia, liver disease, prostate cancer.   SOCIAL HISTORY: Reviewed. Social History  Substance Use Topics  . Smoking status: Never Smoker   . Smokeless tobacco: Never Used  . Alcohol Use: No    Allergies  Allergen Reactions  . Other Other (See Comments)    NO BLOOD PRODUCTS- JEHOVAHS WITNESS  . Sulfa Antibiotics Other (See Comments)    Loss of taste    Current Outpatient Prescriptions  Medication Sig Dispense Refill  . acetaminophen (TYLENOL) 500 MG tablet Take 1,000 mg by mouth 2 (two) times daily as needed for mild pain or moderate pain.    . Alcohol Swabs (B-D SINGLE USE SWABS REGULAR) PADS Use to check blood sugar once a day. 100 each 1  . aspirin EC 81 MG tablet Take 81 mg by mouth daily.    . carvedilol (COREG) 6.25 MG tablet Take 1 tablet by mouth 2 (two) times daily.    . ferrous sulfate 325 (65 FE) MG tablet Take 325 mg by mouth 2 (two) times daily with a meal.    . furosemide (LASIX) 20 MG tablet Take 1 tablet (20 mg total) by mouth daily. 90 tablet 3  . glucose blood (TRUE METRIX BLOOD GLUCOSE TEST) test strip To check blood sugar once a day.  DX E11.9 100 each 1  . metFORMIN (GLUCOPHAGE) 1000 MG tablet Take 1 tablet (1,000  mg total) by mouth 2 (two) times daily with a meal. 60 tablet 5  . mirtazapine (REMERON) 15 MG tablet Take 1 tablet (15 mg total) by mouth at bedtime. Start with 1/2 tablet at night for one week, then increase to 1 tablet at bedtime. (Patient taking differently: Take 15 mg by mouth at bedtime as needed. Start with 1/2 tablet at night for one week, then increase to 1 tablet at bedtime.) 90 tablet 1  . pantoprazole (PROTONIX) 40 MG tablet TAKE 1 TABLET EVERY DAY 90 tablet 3  . Probiotic Product (PROBIOTIC DAILY) CAPS Take 1 capsule by mouth daily. 90 capsule 1  . sacubitril-valsartan (ENTRESTO) 24-26 MG Take 1 tablet by mouth 2 (two) times daily. 60 tablet 3  . senna (SENOKOT) 8.6 MG TABS tablet Take 1 tablet by mouth daily as needed for mild constipation.    . sitaGLIPtin (JANUVIA) 100 MG tablet Take 1 tablet (100 mg total) by mouth daily. 30 tablet 5  . spironolactone (ALDACTONE) 25 MG tablet Take 1 tablet (25 mg total) by mouth daily. 90 tablet 3  . SYNTHROID 50 MCG tablet TAKE 1 TABLET EVERY DAY 90 tablet 3  . TRUEPLUS LANCETS 33G MISC To check blood sugar once a day.  DX: E11.9 100 each 1  .  ALPRAZolam (XANAX) 0.5 MG tablet Take 1 tablet (0.5 mg total) by mouth at bedtime as needed for anxiety. (Patient not taking: Reported on 04/13/2016) 30 tablet 0   No current facility-administered medications for this visit.    PHYSICAL EXAM: Filed Vitals:   04/13/16 1059  BP: 101/62  Pulse: 90  Temp: 96.9 F (36.1 C)     Body mass index is 24.8 kg/(m^2).      GENERAL: Patient is alert and oriented and in no acute distress. There is no icterus. HEENT: EOMs intact. Oral exam negative for thrush or lesions. No cervical lymphadenopathy. LUNGS: Bilaterally clear to auscultation, no rhonchi. Cardiovascular: normal heart sounds, no murmur or gallop present ABDOMEN: Soft, nontender. No hepatosplenomegaly clinically.  EXTREMITIES: No pedal edema. LYMPHATICS: No palpable adenopathy in axillary or inguinal  areas.   LAB RESULTS:    Component Value Date/Time   NA 138 04/01/2016 0955   NA 139 12/10/2015 1245   NA 140 02/04/2015 1426   K 3.9 04/01/2016 0955   K 4.1 02/04/2015 1426   CL 102 04/01/2016 0955   CL 106 02/04/2015 1426   CO2 28 04/01/2016 0955   CO2 26 02/04/2015 1426   GLUCOSE 146* 04/01/2016 0955   GLUCOSE 112* 12/10/2015 1245   GLUCOSE 179* 02/04/2015 1426   BUN 11 04/01/2016 0955   BUN 10 12/10/2015 1245   BUN 11 02/04/2015 1426   CREATININE 0.78 04/01/2016 0955   CREATININE 0.70 02/04/2015 1426   CREATININE 0.8 11/23/2014   CALCIUM 9.7 04/01/2016 0955   CALCIUM 9.2 02/04/2015 1426   PROT 6.8 12/10/2015 1245   PROT 8.3* 04/02/2013 1015   ALBUMIN 4.1 12/10/2015 1245   ALBUMIN 3.8 04/02/2013 1015   AST 11 12/10/2015 1245   AST 17 04/02/2013 1015   ALT 7 12/10/2015 1245   ALT 16 04/02/2013 1015   ALKPHOS 63 12/10/2015 1245   ALKPHOS 67 04/02/2013 1015   BILITOT 0.3 12/10/2015 1245   BILITOT 0.9 04/02/2013 1015   GFRNONAA >60 04/01/2016 0955   GFRNONAA >60 02/04/2015 1426   GFRNONAA >60 11/10/2014 1147   GFRAA >60 04/01/2016 0955   GFRAA >60 02/04/2015 1426   GFRAA >60 11/10/2014 1147   Lab Results  Component Value Date   WBC 3.8 04/13/2016   NEUTROABS 1.7 04/13/2016   HGB 11.6* 04/13/2016   HCT 34.0* 04/13/2016   MCV 88.2 04/13/2016   PLT 120* 04/13/2016   03/05/14 - WBC 2700, 34% neutrophils, ANC 1900, 51% lymphocytes, ALC 1400, 11% monocytes, AMC 300, 3% eos, 1% basos, platelets 121K, Hb 13.1, MCV 90, Cr 0.69, calcium 9.3, albumin 4.0, LFT unremarkable except ALT of 38.  02/25/14 - WBC 3400, Hb 12.1, platelets 89K. 02/17/14 - WBC 3000, ANC 1100, platelets 138K, Hb 13.4.  02/03/14 - WBC 3600, ANC 1300, platelets 139K, Hb 12.6. May 2014 - WBC 9600, Hb 13.3, platelets 117K  02/26/14 - CT scan abdomen/pelvis done for weight loss, nausea, ? mesenteric ischemia reported liver and spleen unremarkable. 02/21/14 - CT scan chest reported severe cardiomegaly,  moderate bilateral pleural effusion, moderate pericardial effusion. July 2014 - Colonoscopy unremarkable.  ASSESSMENT / PLAN:   Leukopenia/Neutropenia along with persistent Thrombocytopenia -   etiology unclear. Clinically labs are stable. Bone marrow biopsy 04/16/14 reports no significant dysplasia or overt neoplasia identified. Normocellular marrow for age cellularity ~40% with erythroid predominant maturing trilineage hematopoiesis, mild nonspecific dyserythropoiesis and adequate megakaryocytes. Diffuse mild reticulin fibrosis (grade 1/3). Decreased iron stores. Flow cytometry unremarkable. Cytogenetics unremarkable, 46XX. Workup  done on 04/07/14 remarkable for WBC 2800, 41% neutrophils, 40% lymphs, teardrop cells, Hb 12.7, platelet clumps (Otherwise retic, Direct platelet Ab, iron study, folate, SIEP, random-UIEP, LDH, haptoglobin, PT, PTT, HBsAg, HCV Ab, HIV Ab, peripheral blood Flow Cytometry all unremarkable)    Patient reportedly has long-standing thrombocytopenia for many years and a remote history of getting a bone marrow biopsy done more than 15 years ago.   Have reviewed labs from today and d/w patient. Blood counts are still low but steady and she is asymptomatic from low blood counts. Plan is to continue with surveillance with primary care provider.  If any time patient needs to be further evaluated due to significant change in leukopenia or thrombocytopenia patient should be referred back to [place] as soon as possible.  Dr. Grayland Ormond was available for consultation and review of plan of care for this patient.   Evlyn Kanner, NP   04/13/2016 11:03 AM

## 2016-04-13 NOTE — Patient Instructions (Signed)
Go to the [facility] on [address] for abdominal Xray after getting blood drawn at [facility]

## 2016-04-14 LAB — COMPREHENSIVE METABOLIC PANEL
ALT: 7 IU/L (ref 0–32)
AST: 14 IU/L (ref 0–40)
Albumin/Globulin Ratio: 1.5 (ref 1.2–2.2)
Albumin: 4.3 g/dL (ref 3.6–4.8)
Alkaline Phosphatase: 66 IU/L (ref 39–117)
BUN / CREAT RATIO: 15 (ref 12–28)
BUN: 11 mg/dL (ref 8–27)
Bilirubin Total: 0.2 mg/dL (ref 0.0–1.2)
CALCIUM: 10.2 mg/dL (ref 8.7–10.3)
CO2: 28 mmol/L (ref 18–29)
Chloride: 100 mmol/L (ref 96–106)
Creatinine, Ser: 0.74 mg/dL (ref 0.57–1.00)
GFR calc Af Amer: 96 mL/min/{1.73_m2} (ref >59)
GFR, EST NON AFRICAN AMERICAN: 83 mL/min/{1.73_m2} (ref >59)
GLOBULIN, TOTAL: 2.8 g/dL (ref 1.5–4.5)
Glucose: 101 mg/dL — ABNORMAL HIGH (ref 65–99)
Potassium: 4.5 mmol/L (ref 3.5–5.2)
SODIUM: 142 mmol/L (ref 134–144)
Total Protein: 7.1 g/dL (ref 6.0–8.5)

## 2016-04-14 LAB — CBC
HEMATOCRIT: 33.3 % — AB (ref 34.0–46.6)
Hemoglobin: 11 g/dL — ABNORMAL LOW (ref 11.1–15.9)
MCH: 28.7 pg (ref 26.6–33.0)
MCHC: 33 g/dL (ref 31.5–35.7)
MCV: 87 fL (ref 79–97)
Platelets: 140 10*3/uL — ABNORMAL LOW (ref 150–379)
RBC: 3.83 x10E6/uL (ref 3.77–5.28)
RDW: 13.5 % (ref 12.3–15.4)
WBC: 3.5 10*3/uL (ref 3.4–10.8)

## 2016-04-14 LAB — AMYLASE: AMYLASE: 90 U/L (ref 31–124)

## 2016-04-15 ENCOUNTER — Telehealth: Payer: Self-pay | Admitting: *Deleted

## 2016-04-15 DIAGNOSIS — R1084 Generalized abdominal pain: Secondary | ICD-10-CM

## 2016-04-15 NOTE — Telephone Encounter (Signed)
Patient was notified of results. Patient expressed understanding. 

## 2016-04-15 NOTE — Telephone Encounter (Signed)
Patient stated that her symptoms are unchanged since her last ov. Patient said that if Dr. Sherrie [name] wants her to get the CT abdomin she will or she will try the medication first. Please advise?

## 2016-04-15 NOTE — Telephone Encounter (Signed)
-----   Message from Malva Limes, MD sent at 04/15/2016  8:11 AM EDT ----- Labs are normal. Xray shows some indications of inflammation. If having any pain then she needs an abdominal CT. If no pain then start metronidazole 500mg  twice a day #14.

## 2016-04-16 NOTE — Telephone Encounter (Signed)
Please schedule CT abdomen for abdominal pain, bowel changes, and abdominal  XR.

## 2016-04-22 ENCOUNTER — Ambulatory Visit: Payer: Medicare PPO

## 2016-04-27 ENCOUNTER — Ambulatory Visit
Admission: RE | Admit: 2016-04-27 | Discharge: 2016-04-27 | Disposition: A | Discharge status: Home / Self Care | Payer: Medicare PPO | Source: Ambulatory Visit | Attending: Family Medicine | Admitting: Family Medicine

## 2016-04-27 DIAGNOSIS — R1084 Generalized abdominal pain: Secondary | ICD-10-CM | POA: Diagnosis present

## 2016-04-27 MED ORDER — IOPAMIDOL (ISOVUE-300) INJECTION 61%
85.0000 mL | Freq: Once | INTRAVENOUS | Status: AC | PRN
  Administered 2016-04-27: 85 mL via INTRAVENOUS

## 2016-05-09 ENCOUNTER — Other Ambulatory Visit: Payer: Self-pay | Admitting: Family Medicine

## 2016-05-09 ENCOUNTER — Other Ambulatory Visit: Payer: Self-pay | Admitting: Family

## 2016-05-09 DIAGNOSIS — E119 Type 2 diabetes mellitus without complications: Secondary | ICD-10-CM

## 2016-05-09 MED ORDER — SITAGLIPTIN PHOSPHATE 100 MG PO TABS: 100.0000 mg | ORAL_TABLET | Freq: Every day | ORAL | Status: DC

## 2016-05-09 MED ORDER — SACUBITRIL-VALSARTAN 24-26 MG PO TABS: 1.0000 | ORAL_TABLET | Freq: Two times a day (BID) | ORAL | Status: DC

## 2016-05-09 NOTE — Telephone Encounter (Signed)
Pt contacted office for refill request on the following medications:  sitaGLIPtin (JANUVIA) 100 MG tablet.  90 day supply.  [facility] mail order.  XN#[phone]/[id number]

## 2016-06-08 ENCOUNTER — Other Ambulatory Visit: Payer: Self-pay | Admitting: Family Medicine

## 2016-06-08 DIAGNOSIS — K219 Gastro-esophageal reflux disease without esophagitis: Secondary | ICD-10-CM

## 2016-06-15 ENCOUNTER — Other Ambulatory Visit: Payer: Self-pay | Admitting: Family Medicine

## 2016-06-15 DIAGNOSIS — E039 Hypothyroidism, unspecified: Secondary | ICD-10-CM

## 2016-06-21 ENCOUNTER — Ambulatory Visit (INDEPENDENT_AMBULATORY_CARE_PROVIDER_SITE_OTHER): Payer: Medicare PPO | Admitting: *Deleted

## 2016-06-21 DIAGNOSIS — I5022 Chronic systolic (congestive) heart failure: Secondary | ICD-10-CM

## 2016-06-21 DIAGNOSIS — I428 Other cardiomyopathies: Secondary | ICD-10-CM

## 2016-06-21 DIAGNOSIS — Z95810 Presence of automatic (implantable) cardiac defibrillator: Secondary | ICD-10-CM

## 2016-06-21 NOTE — Progress Notes (Signed)
Remote ICD transmission.   

## 2016-06-22 ENCOUNTER — Encounter: Payer: Self-pay | Admitting: Cardiology

## 2016-07-04 ENCOUNTER — Ambulatory Visit: Payer: Medicare PPO | Admitting: Family

## 2016-07-06 ENCOUNTER — Encounter: Payer: Self-pay | Admitting: Cardiology

## 2016-07-07 ENCOUNTER — Ambulatory Visit (INDEPENDENT_AMBULATORY_CARE_PROVIDER_SITE_OTHER): Payer: Medicare PPO | Admitting: Family Medicine

## 2016-07-07 ENCOUNTER — Encounter: Payer: Self-pay | Admitting: Family Medicine

## 2016-07-07 VITALS — BP 90/54 | HR 88 | Temp 98.2°F | Resp 16 | Ht 61.0 in | Wt 137.0 lb

## 2016-07-07 DIAGNOSIS — F5101 Primary insomnia: Secondary | ICD-10-CM | POA: Diagnosis not present

## 2016-07-07 DIAGNOSIS — Z23 Encounter for immunization: Secondary | ICD-10-CM | POA: Diagnosis not present

## 2016-07-07 DIAGNOSIS — R413 Other amnesia: Secondary | ICD-10-CM

## 2016-07-07 DIAGNOSIS — E119 Type 2 diabetes mellitus without complications: Secondary | ICD-10-CM

## 2016-07-07 LAB — POCT GLYCOSYLATED HEMOGLOBIN (HGB A1C)
ESTIMATED AVERAGE GLUCOSE: 154
HEMOGLOBIN A1C: 7

## 2016-07-07 MED ORDER — SITAGLIP PHOS-METFORMIN HCL ER 100-1000 MG PO TB24: 1.0000 | ORAL_TABLET | Freq: Every day | ORAL | 3 refills | Status: DC

## 2016-07-07 MED ORDER — MIRTAZAPINE 7.5 MG PO TABS: 7.5000 mg | ORAL_TABLET | Freq: Every day | ORAL | 3 refills | Status: DC

## 2016-07-07 NOTE — Progress Notes (Signed)
Patient: Bethany [name] Female    DOB: 02-01-1946   70 y.o.   MRN: [id number] Visit Date: 07/07/2016  Today's Provider: [name], MD   Chief Complaint  Patient presents with  . Follow-up  . Diabetes  . Insomnia   Subjective:    HPI  Idiopathic insomnia: From 01/26/2016-patient advised to cut the tablet in half due to weight gain. She states she continues to sleep well on lower dose, but is still concerned about weight gain.     Diabetes Mellitus Type II, Follow-up:   Lab Results  Component Value Date   HGBA1C 7.0 07/07/2016   HGBA1C 8.4 03/15/2016   HGBA1C 7.3 11/11/2015   Last seen for diabetes 3 months ago.  Management since then includes; increased metformin to 1000 mg bid and added Januvia. She reports good compliance with treatment. She is having side effects. none Current symptoms include none and have been unchanged. Home blood sugar records: fasting range: 90-118  Episodes of hypoglycemia? no   Current Insulin Regimen: n/a Most Recent Eye Exam: due  Weight trend: stable Prior visit with dietician: no Current diet: well balanced Current exercise: walking  ----------------------------------------------------------------  Patient reports problems with swallowing. Has trouble swallowing some foods. Patient has had a upper GI and swallow study in 2015 which was normal. . Patient also reports pain in her legs at night when she lays down. Patient also reports pain in her shoulders. She has been more forgetful lately She is concerned because her son has Autism and she feels like she is having the same types of problems with communicating and with word finding that her son has.      Wt Readings from Last 3 Encounters:  07/07/16 137 lb (62.1 kg)  04/13/16 132 lb (59.9 kg)  04/13/16 131 lb 2.8 oz (59.5 kg)     Allergies  Allergen Reactions  . Other Other (See Comments)    NO BLOOD PRODUCTS- JEHOVAHS WITNESS  . Sulfa Antibiotics Other (See  Comments)    Loss of taste   Current Meds  Medication Sig  . acetaminophen (TYLENOL) 500 MG tablet Take 1,000 mg by mouth 2 (two) times daily as needed for mild pain or moderate pain.  . Alcohol Swabs (B-D SINGLE USE SWABS REGULAR) PADS Use to check blood sugar once a day.  . ALPRAZolam (XANAX) 0.5 MG tablet Take 1 tablet (0.5 mg total) by mouth at bedtime as needed for anxiety.  Marland Kitchen aspirin EC 81 MG tablet Take 81 mg by mouth daily.  . carvedilol (COREG) 6.25 MG tablet Take 1 tablet by mouth 2 (two) times daily.  . ferrous sulfate 325 (65 FE) MG tablet Take 325 mg by mouth 2 (two) times daily with a meal.  . furosemide (LASIX) 20 MG tablet Take 1 tablet (20 mg total) by mouth daily.  Marland Kitchen glucose blood (TRUE METRIX BLOOD GLUCOSE TEST) test strip To check blood sugar once a day.  DX E11.9  . metFORMIN (GLUCOPHAGE) 1000 MG tablet Take 1 tablet (1,000 mg total) by mouth 2 (two) times daily with a meal.  . metFORMIN (GLUCOPHAGE) 500 MG tablet TAKE 1 TABLET TWICE DAILY WITH A MEAL  . mirtazapine (REMERON) 15 MG tablet Take 1 tablet (15 mg total) by mouth at bedtime. Start with 1/2 tablet at night for one week, then increase to 1 tablet at bedtime. (Patient taking differently: Take 15 mg by mouth at bedtime as needed. Start with 1/2 tablet at night for  one week, then increase to 1 tablet at bedtime.)  . pantoprazole (PROTONIX) 40 MG tablet TAKE 1 TABLET EVERY DAY  . Probiotic Product (PROBIOTIC DAILY) CAPS Take 1 capsule by mouth daily.  . sacubitril-valsartan (ENTRESTO) 24-26 MG Take 1 tablet by mouth 2 (two) times daily.  Marland Kitchen. senna (SENOKOT) 8.6 MG TABS tablet Take 1 tablet by mouth daily as needed for mild constipation.  . sitaGLIPtin (JANUVIA) 100 MG tablet Take 1 tablet (100 mg total) by mouth daily.  Marland Kitchen. spironolactone (ALDACTONE) 25 MG tablet Take 1 tablet (25 mg total) by mouth daily.  Marland Kitchen. SYNTHROID 50 MCG tablet TAKE 1 TABLET EVERY DAY  . TRUEPLUS LANCETS 33G MISC To check blood sugar once a day.   DX: E11.9   Patient Active Problem List   Diagnosis Date Noted  . GERD (gastroesophageal reflux disease) 08/24/2015  . Automatic implantable cardioverter-defibrillator in situ 07/29/2015  . Hypotension 06/18/2015  . Benign essential HTN 03/20/2015  . Absolute anemia 03/11/2015  . Abnormal liver enzymes 03/11/2015  . Decreased potassium in the blood 03/11/2015  . Hypercholesteremia 03/11/2015  . Adult hypothyroidism 03/11/2015  . Idiopathic insomnia 03/11/2015  . OP (osteoporosis) 03/11/2015  . Diabetes mellitus, type 2 (HCC) 03/11/2015  . LBBB (left bundle branch block) 02/11/2015  . Paroxysmal supraventricular tachycardia (HCC) 11/25/2014  . Chronic systolic heart failure (HCC) 09/25/2014  . Heart valve disease 04/10/2014   Past Medical History:  Diagnosis Date  . Anxiety   . CHF (congestive heart failure) (HCC)   . Depression   . Diabetes (HCC)   . Dilated cardiomyopathy (HCC)   . Diverticulosis   . GERD (gastroesophageal reflux disease)   . Hyperlipidemia   . Hypertension   . LV dysfunction   . Reflux   . Thrombocytopenia (HCC)   . Thyroid disease   . VHD (valvular heart disease)     Review of Systems  Constitutional: Negative for appetite change, chills, fatigue and fever.  HENT: Positive for trouble swallowing.   Respiratory: Negative for chest tightness and shortness of breath.   Cardiovascular: Negative for chest pain and palpitations.  Gastrointestinal: Negative for abdominal pain, nausea and vomiting.  Neurological: Negative for dizziness and weakness.    Social History  Substance Use Topics  . Smoking status: Never Smoker  . Smokeless tobacco: Never Used  . Alcohol use No   Objective:   BP (!) 90/54 (BP Location: Left Arm, Patient Position: Sitting, Cuff Size: Normal)   Pulse 88   Temp 98.2 F (36.8 C) (Oral)   Resp 16   Ht 5\' 1"  (1.549 m)   Wt 137 lb (62.1 kg)   LMP  (LMP Unknown)   SpO2 97%   BMI 25.89 kg/m   Physical Exam   General  Appearance:    Alert, cooperative, no distress  Eyes:    PERRL, conjunctiva/corneas clear, EOM's intact       Lungs:     Clear to auscultation bilaterally, respirations unlabored  Heart:    Regular rate and rhythm  Neurologic:   Awake, alert, oriented x 3. No apparent focal neurological           defect.       Results for orders placed or performed in visit on 07/07/16  POCT glycosylated hemoglobin (Hb A1C)  Result Value Ref Range   Hemoglobin A1C 7.0    Est. average glucose Bld gHb Est-mCnc 154        Assessment & Plan:     1. Type  2 diabetes mellitus without complication, without long-term current use of insulin (HCC) Well controlled. . Ccm  - POCT glycosylated hemoglobin (Hb A1C)  2. Idiopathic insomnia Doing well on 1/2 x 15mg  remeron hs. Change to a 7.5mg  tablet - mirtazapine (REMERON) 7.5 MG tablet; Take 1 tablet (7.5 mg total) by mouth at bedtime.  Dispense: 90 tablet; Refill: 3  3. Need for influenza vaccination  - Flu vaccine HIGH DOSE PF  4. Congestive heart failure Well controlled. No sign of acute exacerbation. Continue routine follow up at CHF clinic.   5. Memory deficit  - Ambulatory referral to Neurology  Return in about 4 months (around 11/06/2016).       [name], MD  [facility]

## 2016-07-12 ENCOUNTER — Ambulatory Visit: Payer: Medicare PPO | Attending: Family | Admitting: Family

## 2016-07-12 ENCOUNTER — Encounter: Payer: Self-pay | Admitting: Family

## 2016-07-12 VITALS — BP 96/56 | HR 80 | Resp 18 | Ht 61.0 in | Wt 135.0 lb

## 2016-07-12 DIAGNOSIS — I5022 Chronic systolic (congestive) heart failure: Secondary | ICD-10-CM | POA: Insufficient documentation

## 2016-07-12 DIAGNOSIS — D696 Thrombocytopenia, unspecified: Secondary | ICD-10-CM | POA: Insufficient documentation

## 2016-07-12 DIAGNOSIS — E119 Type 2 diabetes mellitus without complications: Secondary | ICD-10-CM

## 2016-07-12 DIAGNOSIS — K5790 Diverticulosis of intestine, part unspecified, without perforation or abscess without bleeding: Secondary | ICD-10-CM | POA: Insufficient documentation

## 2016-07-12 DIAGNOSIS — I359 Nonrheumatic aortic valve disorder, unspecified: Secondary | ICD-10-CM | POA: Diagnosis not present

## 2016-07-12 DIAGNOSIS — I429 Cardiomyopathy, unspecified: Secondary | ICD-10-CM | POA: Diagnosis not present

## 2016-07-12 DIAGNOSIS — Z8249 Family history of ischemic heart disease and other diseases of the circulatory system: Secondary | ICD-10-CM | POA: Insufficient documentation

## 2016-07-12 DIAGNOSIS — Z881 Allergy status to other antibiotic agents status: Secondary | ICD-10-CM | POA: Insufficient documentation

## 2016-07-12 DIAGNOSIS — Z7982 Long term (current) use of aspirin: Secondary | ICD-10-CM | POA: Insufficient documentation

## 2016-07-12 DIAGNOSIS — I950 Idiopathic hypotension: Secondary | ICD-10-CM | POA: Insufficient documentation

## 2016-07-12 DIAGNOSIS — I110 Hypertensive heart disease with heart failure: Secondary | ICD-10-CM | POA: Diagnosis not present

## 2016-07-12 DIAGNOSIS — Z7984 Long term (current) use of oral hypoglycemic drugs: Secondary | ICD-10-CM | POA: Insufficient documentation

## 2016-07-12 DIAGNOSIS — Z833 Family history of diabetes mellitus: Secondary | ICD-10-CM | POA: Insufficient documentation

## 2016-07-12 DIAGNOSIS — R600 Localized edema: Secondary | ICD-10-CM | POA: Diagnosis not present

## 2016-07-12 DIAGNOSIS — Z950 Presence of cardiac pacemaker: Secondary | ICD-10-CM | POA: Insufficient documentation

## 2016-07-12 DIAGNOSIS — Z9049 Acquired absence of other specified parts of digestive tract: Secondary | ICD-10-CM | POA: Diagnosis not present

## 2016-07-12 DIAGNOSIS — Z823 Family history of stroke: Secondary | ICD-10-CM | POA: Insufficient documentation

## 2016-07-12 DIAGNOSIS — Z90711 Acquired absence of uterus with remaining cervical stump: Secondary | ICD-10-CM | POA: Insufficient documentation

## 2016-07-12 DIAGNOSIS — R413 Other amnesia: Secondary | ICD-10-CM | POA: Diagnosis not present

## 2016-07-12 DIAGNOSIS — K219 Gastro-esophageal reflux disease without esophagitis: Secondary | ICD-10-CM | POA: Insufficient documentation

## 2016-07-12 DIAGNOSIS — E785 Hyperlipidemia, unspecified: Secondary | ICD-10-CM | POA: Insufficient documentation

## 2016-07-12 LAB — CUP PACEART REMOTE DEVICE CHECK
Brady Statistic AP VP Percent: 0.03 %
Brady Statistic AP VS Percent: 0.01 %
Brady Statistic AS VP Percent: 98.71 %
Brady Statistic AS VS Percent: 1.25 %
Brady Statistic RA Percent Paced: 0.04 %
Brady Statistic RV Percent Paced: 11.91 %
Date Time Interrogation Session: 20170815083723
HighPow Impedance: 53 Ohm
Implantable Lead Implant Date: 20160409
Implantable Lead Implant Date: 20160409
Implantable Lead Implant Date: 20160409
Implantable Lead Location: 753858
Implantable Lead Location: 753860
Lead Channel Impedance Value: 1007 Ohm
Lead Channel Impedance Value: 418 Ohm
Lead Channel Impedance Value: 456 Ohm
Lead Channel Impedance Value: 513 Ohm
Lead Channel Impedance Value: 532 Ohm
Lead Channel Impedance Value: 665 Ohm
Lead Channel Impedance Value: 931 Ohm
Lead Channel Pacing Threshold Amplitude: 0.375 V
Lead Channel Pacing Threshold Amplitude: 0.625 V
Lead Channel Pacing Threshold Amplitude: 2.25 V
Lead Channel Sensing Intrinsic Amplitude: 0.875 mV
Lead Channel Sensing Intrinsic Amplitude: 0.875 mV
Lead Channel Sensing Intrinsic Amplitude: 8.875 mV
Lead Channel Sensing Intrinsic Amplitude: 8.875 mV
Lead Channel Setting Pacing Amplitude: 3.25 V
Lead Channel Setting Sensing Sensitivity: 0.3 mV
MDC IDC LEAD LOCATION: 753859
MDC IDC LEAD MODEL: 4598
MDC IDC MSMT BATTERY REMAINING LONGEVITY: 56 mo
MDC IDC MSMT BATTERY VOLTAGE: 2.92 V
MDC IDC MSMT LEADCHNL LV IMPEDANCE VALUE: 1007 Ohm
MDC IDC MSMT LEADCHNL LV IMPEDANCE VALUE: 817 Ohm
MDC IDC MSMT LEADCHNL LV IMPEDANCE VALUE: 874 Ohm
MDC IDC MSMT LEADCHNL LV IMPEDANCE VALUE: 893 Ohm
MDC IDC MSMT LEADCHNL LV PACING THRESHOLD PULSEWIDTH: 0.8 ms
MDC IDC MSMT LEADCHNL RA IMPEDANCE VALUE: 399 Ohm
MDC IDC MSMT LEADCHNL RA PACING THRESHOLD PULSEWIDTH: 0.4 ms
MDC IDC MSMT LEADCHNL RV IMPEDANCE VALUE: 342 Ohm
MDC IDC MSMT LEADCHNL RV PACING THRESHOLD PULSEWIDTH: 0.4 ms
MDC IDC SET LEADCHNL LV PACING PULSEWIDTH: 0.8 ms
MDC IDC SET LEADCHNL RA PACING AMPLITUDE: 1.5 V

## 2016-07-12 NOTE — Patient Instructions (Signed)
Continue weighing daily and call for an overnight weight gain of > 2 pounds or a weekly weight gain of >5 pounds. 

## 2016-07-12 NOTE — Progress Notes (Signed)
Subjective:    Patient ID: Bethany [name], female    DOB: [birth year], 70 y.o.   MRN: [id number]  Congestive Heart Failure  Presents for follow-up visit. The disease [name] has been stable. Associated symptoms include edema, fatigue ("better") and shortness of breath ("little bit"). Pertinent negatives include no abdominal pain, chest pain, orthopnea or palpitations. The symptoms have been stable. Past treatments include angiotensin receptor blockers, beta blockers, salt and fluid restriction and aldosterone receptor blockers. The treatment provided moderate relief. Compliance with prior treatments has been good. Her past medical history is significant for DM, HTN and valvular heart disease. She has multiple 1st degree relatives with heart disease.  Other  This is a chronic (hypotension) problem. The current episode started more than 1 year ago. The problem occurs daily. The problem has been unchanged. Associated symptoms include congestion, coughing and fatigue ("better"). Pertinent negatives include no abdominal pain, chest pain, neck pain, sore throat or weakness. Nothing aggravates the symptoms. She has tried position changes for the symptoms. The treatment provided mild relief.   Past Medical History:  Diagnosis Date  . Anxiety   . CHF (congestive heart failure) ([place])   . Depression   . Diabetes ([place])   . Dilated cardiomyopathy ([place])   . Diverticulosis   . GERD (gastroesophageal reflux disease)   . Hyperlipidemia   . Hypertension   . LV dysfunction   . Reflux   . Thrombocytopenia ([place])   . Thyroid disease   . VHD (valvular heart disease)     Past Surgical History:  Procedure Laterality Date  . BI-VENTRICULAR IMPLANTABLE CARDIOVERTER DEFIBRILLATOR [name] 02/11/2015   Procedure: BI-VENTRICULAR IMPLANTABLE CARDIOVERTER DEFIBRILLATOR  (CRT-D);  Surgeon: Deboraha Sprang, MD;  Location: [facility] CATH LAB;  Service: Cardiovascular;  Laterality: [name];  . BONE MARROW BIOPSY    . CARDIAC  CATHETERIZATION    . CHOLECYSTECTOMY    . INSERT / REPLACE / REMOVE PACEMAKER    . LEAD REVISION [name] 02/11/2015   Procedure: LEAD REVISION;  Surgeon: Deboraha Sprang, MD;  Location: [facility] CATH LAB;  Service: Cardiovascular;  Laterality: [name];  . PARTIAL HYSTERECTOMY    . TOOTH EXTRACTION  09/23/2015    Family History  Problem Relation Age of Onset  . Heart disease Mother   . Heart attack Mother   . Prostate cancer Father   . Ulcers Father   . Stroke Sister   . Heart attack Sister   . Diabetes Sister   . Heart attack Brother   . Stroke Brother     Social History  Substance Use Topics  . Smoking status: Never Smoker  . Smokeless tobacco: Never Used  . Alcohol use No    Allergies  Allergen Reactions  . Other Other (See Comments)    NO BLOOD PRODUCTS- JEHOVAHS WITNESS  . Sulfa Antibiotics Other (See Comments)    Loss of taste    Prior to Admission medications   Medication Sig Start Date End Date Taking? Authorizing Provider  acetaminophen (TYLENOL) 500 MG tablet Take 1,000 mg by mouth 2 (two) times daily as needed for mild pain or moderate pain.   Yes Historical Provider, MD  Alcohol Swabs (B-D SINGLE USE SWABS REGULAR) PADS Use to check blood sugar once a day. 03/08/16  Yes Margarita Rana, MD  ALPRAZolam Duanne Moron) 0.5 MG tablet Take 1 tablet (0.5 mg total) by mouth at bedtime as needed for anxiety. 05/13/15  Yes Margarita Rana, MD  aspirin EC 81 MG tablet Take 81 mg  by mouth daily.   Yes Historical Provider, MD  carvedilol (COREG) 6.25 MG tablet Take 1 tablet by mouth 2 (two) times daily. 03/26/15  Yes Historical Provider, MD  ferrous sulfate 325 (65 FE) MG tablet Take 325 mg by mouth 2 (two) times daily with a meal.   Yes Historical Provider, MD  furosemide (LASIX) 20 MG tablet Take 1 tablet (20 mg total) by mouth daily. 12/17/15  Yes Alisa Graff, FNP  glucose blood (TRUE METRIX BLOOD GLUCOSE TEST) test strip To check blood sugar once a day.  DX E11.9 03/16/16  Yes Margarita Rana, MD   metFORMIN (GLUCOPHAGE) 500 MG tablet TAKE 1 TABLET TWICE DAILY WITH A MEAL 06/10/16  Yes Birdie Sons, MD  mirtazapine (REMERON) 7.5 MG tablet Take 1 tablet (7.5 mg total) by mouth at bedtime. 07/07/16  Yes Birdie Sons, MD  pantoprazole (PROTONIX) 40 MG tablet TAKE 1 TABLET EVERY DAY 06/10/16  Yes Birdie Sons, MD  Probiotic Product (PROBIOTIC DAILY) CAPS Take 1 capsule by mouth daily. 12/10/15  Yes Margarita Rana, MD  sacubitril-valsartan (ENTRESTO) 24-26 MG Take 1 tablet by mouth 2 (two) times daily. 05/09/16  Yes Alisa Graff, FNP  senna (SENOKOT) 8.6 MG TABS tablet Take 1 tablet by mouth daily as needed for mild constipation.   Yes Historical Provider, MD  sitaGLIPtin (JANUVIA) 100 MG tablet Take 1 tablet (100 mg total) by mouth daily. 05/09/16  Yes Birdie Sons, MD  spironolactone (ALDACTONE) 25 MG tablet Take 1 tablet (25 mg total) by mouth daily. 12/17/15  Yes Alisa Graff, FNP  SYNTHROID 50 MCG tablet TAKE 1 TABLET EVERY DAY 06/15/16  Yes Birdie Sons, MD  TRUEPLUS LANCETS 33G MISC To check blood sugar once a day.  DX: E11.9 03/08/16  Yes Margarita Rana, MD  SitaGLIPtin-MetFORMIN HCl [phone] MG TB24 Take 1 tablet by mouth daily. Replaces Januvia and metformin Patient not taking: Reported on 07/12/2016 07/07/16   Birdie Sons, MD      Review of Systems  Constitutional: Positive for fatigue ("better"). Negative for appetite change.  HENT: Positive for congestion, hearing loss and voice change ("hoarseness"). Negative for sore throat.   Eyes: Negative.   Respiratory: Positive for cough, chest tightness and shortness of breath ("little bit").   Cardiovascular: Positive for leg swelling ("little bit"). Negative for chest pain and palpitations.  Gastrointestinal: Negative for abdominal distention and abdominal pain.  Endocrine: Negative.   Genitourinary: Negative.   Musculoskeletal: Negative for back pain and neck pain.  Skin: Negative.   Allergic/Immunologic: Negative.    Neurological: Positive for light-headedness. Negative for dizziness and weakness.       Short-term memory loss   Hematological: Negative for adenopathy. Does not bruise/bleed easily.  Psychiatric/Behavioral: Positive for dysphoric mood. Negative for sleep disturbance (sleeping on 3 pillows). The patient is nervous/anxious.        Objective:   Physical Exam  Constitutional: She is oriented to person, place, and time. She appears well-developed and well-nourished.  HENT:  Head: Normocephalic and atraumatic.  Eyes: Conjunctivae are normal. Pupils are equal, round, and reactive to light.  Neck: Normal range of motion.  Cardiovascular: Normal rate and regular rhythm.   Pulmonary/Chest: Effort normal. She has no wheezes. She has no rales.  Abdominal: Soft. She exhibits no distension. There is no tenderness.  Musculoskeletal: She exhibits edema (trace amount pitting edema in bilateral lower legs). She exhibits no tenderness.  Neurological: She is alert and oriented to person, place,  and time.  Skin: Skin is warm and dry.  Psychiatric: She has a normal mood and affect. Her behavior is normal. Thought content normal.  Nursing note and vitals reviewed.  BP (!) 96/56 (BP Location: Right Arm, Patient Position: Sitting)   Pulse 80   Resp 18   Ht '5\' 1"'  (1.549 m)   Wt 135 lb (61.2 kg)   LMP  (LMP Unknown)   SpO2 100%   BMI 25.51 kg/m         Assessment & Plan:  1: Chronic heart failure with reduced ejection fraction- Patient presents with fatigue which is improving and minimal shortness of breath with exertion (Class II) which does improve upon rest. She continues to weigh herself daily and her home weight chart shows a stable weight. By our scale, she has gained 4 pounds since she was last here on 04/01/16. Reminded to call for an overnight weight gain of >2 pounds or a weekly weight gain of >5 pounds. She is not adding any salt to her food and tries to eat low sodium foods. [place] Pharm D  went in and reviewed medications with the patient. Patient has an appointment with her cardiologist Nehemiah Massed) on 08/22/16.  2: Hypotension- Blood pressure remains on the low side although she doesn't experience any dizziness. Unable to titrate up medications due to her low blood pressure. 3: Diabetes- She says that her glucose this morning was 103. She is going to begin taking the combination sitagliptin-metformin when she receives the medication in the mail. She is aware that she will not take the individual agents when she starts the combination medication. Follows closely with her PCP regarding this. 4: Memory loss- She says that she's a little anxious about her short-term memory loss. She has an appointment currently scheduled with the neurologist on 09/14/16.  Medication list was reviewed.  Return here in 6 months or sooner for any questions/problems before then.

## 2016-07-19 ENCOUNTER — Other Ambulatory Visit: Payer: Self-pay | Admitting: Family Medicine

## 2016-07-19 DIAGNOSIS — E119 Type 2 diabetes mellitus without complications: Secondary | ICD-10-CM

## 2016-07-25 ENCOUNTER — Other Ambulatory Visit: Payer: Self-pay | Admitting: Family Medicine

## 2016-07-25 DIAGNOSIS — E119 Type 2 diabetes mellitus without complications: Secondary | ICD-10-CM

## 2016-07-30 ENCOUNTER — Emergency Department
Admission: EM | Admit: 2016-07-30 | Discharge: 2016-07-30 | Disposition: A | Discharge status: Home / Self Care | Payer: Medicare PPO | Attending: Emergency Medicine | Admitting: Emergency Medicine

## 2016-07-30 ENCOUNTER — Emergency Department: Payer: Medicare PPO

## 2016-07-30 ENCOUNTER — Encounter: Payer: Self-pay | Admitting: Emergency Medicine

## 2016-07-30 DIAGNOSIS — Z79899 Other long term (current) drug therapy: Secondary | ICD-10-CM | POA: Insufficient documentation

## 2016-07-30 DIAGNOSIS — M542 Cervicalgia: Secondary | ICD-10-CM | POA: Insufficient documentation

## 2016-07-30 DIAGNOSIS — M79602 Pain in left arm: Secondary | ICD-10-CM | POA: Diagnosis not present

## 2016-07-30 DIAGNOSIS — I5022 Chronic systolic (congestive) heart failure: Secondary | ICD-10-CM | POA: Diagnosis not present

## 2016-07-30 DIAGNOSIS — Z7984 Long term (current) use of oral hypoglycemic drugs: Secondary | ICD-10-CM | POA: Insufficient documentation

## 2016-07-30 DIAGNOSIS — Z95810 Presence of automatic (implantable) cardiac defibrillator: Secondary | ICD-10-CM | POA: Insufficient documentation

## 2016-07-30 DIAGNOSIS — Z791 Long term (current) use of non-steroidal anti-inflammatories (NSAID): Secondary | ICD-10-CM | POA: Diagnosis not present

## 2016-07-30 DIAGNOSIS — E039 Hypothyroidism, unspecified: Secondary | ICD-10-CM | POA: Insufficient documentation

## 2016-07-30 DIAGNOSIS — R079 Chest pain, unspecified: Secondary | ICD-10-CM

## 2016-07-30 DIAGNOSIS — R0789 Other chest pain: Secondary | ICD-10-CM | POA: Diagnosis present

## 2016-07-30 DIAGNOSIS — Z7982 Long term (current) use of aspirin: Secondary | ICD-10-CM | POA: Diagnosis not present

## 2016-07-30 DIAGNOSIS — E119 Type 2 diabetes mellitus without complications: Secondary | ICD-10-CM | POA: Insufficient documentation

## 2016-07-30 DIAGNOSIS — I110 Hypertensive heart disease with heart failure: Secondary | ICD-10-CM | POA: Diagnosis not present

## 2016-07-30 LAB — CBC
HCT: 32.9 % — ABNORMAL LOW (ref 35.0–47.0)
HEMOGLOBIN: 11.4 g/dL — AB (ref 12.0–16.0)
MCH: 30.6 pg (ref 26.0–34.0)
MCHC: 34.5 g/dL (ref 32.0–36.0)
MCV: 88.7 fL (ref 80.0–100.0)
PLATELETS: 117 10*3/uL — AB (ref 150–440)
RBC: 3.71 MIL/uL — ABNORMAL LOW (ref 3.80–5.20)
RDW: 13.2 % (ref 11.5–14.5)
WBC: 3.8 10*3/uL (ref 3.6–11.0)

## 2016-07-30 LAB — BASIC METABOLIC PANEL
Anion gap: 3 — ABNORMAL LOW (ref 5–15)
BUN: 9 mg/dL (ref 6–20)
CO2: 31 mmol/L (ref 22–32)
Calcium: 9.6 mg/dL (ref 8.9–10.3)
Chloride: 102 mmol/L (ref 101–111)
Creatinine, Ser: 0.89 mg/dL (ref 0.44–1.00)
GFR calc Af Amer: >60 mL/min (ref >60)
GFR calc non Af Amer: >60 mL/min (ref >60)
Glucose, Bld: 145 mg/dL — ABNORMAL HIGH (ref 65–99)
Potassium: 4.1 mmol/L (ref 3.5–5.1)
Sodium: 136 mmol/L (ref 135–145)

## 2016-07-30 LAB — TROPONIN I: Troponin I: <0.03 ng/mL (ref <0.03)

## 2016-07-30 NOTE — ED Notes (Signed)
Report from kim, rn.  

## 2016-07-30 NOTE — ED Notes (Signed)
Pt discharged to home.  Family member driving.  Discharge instructions reviewed.  Verbalized understanding.  No questions or concerns at this time.  Teach back verified.  Pt in NAD.  No items left in ED.   

## 2016-07-30 NOTE — ED Triage Notes (Addendum)
Pt presents with right side neck pain for one week and today her left arm is aching. Also reports some chest discomfort.

## 2016-07-30 NOTE — Discharge Instructions (Signed)
Please seek medical attention for any high fevers, chest pain, shortness of breath, change in behavior, persistent vomiting, bloody stool or any other new or concerning symptoms.  

## 2016-07-30 NOTE — ED Provider Notes (Signed)
[facility] Emergency Department Provider Note   ____________________________________________   I have reviewed the triage vital signs and the nursing notes.   HISTORY  Chief Complaint Chest Pain and Torticollis   History limited by: Not Limited   HPI Bethany [name] is a 70 y.o. female who presents to the emergency department today because of concern for left arm and neck pain. The patient states that this pain started earlier in the day. She was sitting down when it started. The pain is located in her left upper arm and neck. The neck pain is worse with movement. She denies any shortness of breath. She did have a tinge of chest discomfort. She has been having problems with right sided neck pain for the past week. She has been using tylenol to help with her pain which has been somewhat effective. She denies any pain like this in the past. She denies any recent fevers or illness.     Past Medical History:  Diagnosis Date  . Anxiety   . CHF (congestive heart failure) ([place])   . Depression   . Diabetes ([place])   . Dilated cardiomyopathy ([place])   . Diverticulosis   . GERD (gastroesophageal reflux disease)   . Hyperlipidemia   . LV dysfunction   . Reflux   . Thrombocytopenia ([place])   . Thyroid disease   . VHD (valvular heart disease)     Patient Active Problem List   Diagnosis Date Noted  . Memory loss 07/12/2016  . GERD (gastroesophageal reflux disease) 08/24/2015  . Automatic implantable cardioverter-defibrillator in situ 07/29/2015  . Hypotension 06/18/2015  . Benign essential HTN 03/20/2015  . Absolute anemia 03/11/2015  . Abnormal liver enzymes 03/11/2015  . Decreased potassium in the blood 03/11/2015  . Hypercholesteremia 03/11/2015  . Adult hypothyroidism 03/11/2015  . Idiopathic insomnia 03/11/2015  . OP (osteoporosis) 03/11/2015  . Diabetes mellitus, type 2 ([place]) 03/11/2015  . LBBB (left bundle branch block) 02/11/2015  . Paroxysmal  supraventricular tachycardia ([place]) 11/25/2014  . Chronic systolic heart failure ([place]) 09/25/2014  . Heart valve disease 04/10/2014    Past Surgical History:  Procedure Laterality Date  . BI-VENTRICULAR IMPLANTABLE CARDIOVERTER DEFIBRILLATOR [name] 02/11/2015   Procedure: BI-VENTRICULAR IMPLANTABLE CARDIOVERTER DEFIBRILLATOR  (CRT-D);  Surgeon: Deboraha Sprang, MD;  Location: [facility] CATH LAB;  Service: Cardiovascular;  Laterality: [name];  . BONE MARROW BIOPSY    . CARDIAC CATHETERIZATION    . CHOLECYSTECTOMY    . INSERT / REPLACE / REMOVE PACEMAKER    . LEAD REVISION [name] 02/11/2015   Procedure: LEAD REVISION;  Surgeon: Deboraha Sprang, MD;  Location: [facility] CATH LAB;  Service: Cardiovascular;  Laterality: [name];  . PARTIAL HYSTERECTOMY    . TOOTH EXTRACTION  09/23/2015    Prior to Admission medications   Medication Sig Start Date End Date Taking? Authorizing Provider  acetaminophen (TYLENOL) 500 MG tablet Take 1,000 mg by mouth 2 (two) times daily as needed for mild pain or moderate pain.    Historical Provider, MD  Alcohol Swabs (B-D SINGLE USE SWABS REGULAR) PADS CHECK BLOOD SUGAR DAILY 07/25/16   Birdie Sons, MD  ALPRAZolam Duanne Moron) 0.5 MG tablet Take 1 tablet (0.5 mg total) by mouth at bedtime as needed for anxiety. 05/13/15   Margarita Rana, MD  aspirin EC 81 MG tablet Take 81 mg by mouth daily.    Historical Provider, MD  carvedilol (COREG) 6.25 MG tablet Take 1 tablet by mouth 2 (two) times daily. 03/26/15  Historical Provider, MD  ferrous sulfate 325 (65 FE) MG tablet Take 325 mg by mouth 2 (two) times daily with a meal.    Historical Provider, MD  furosemide (LASIX) 20 MG tablet Take 1 tablet (20 mg total) by mouth daily. 12/17/15   Alisa Graff, FNP  glucose blood (TRUE METRIX BLOOD GLUCOSE TEST) test strip To check blood sugar once a day.  DX E11.9 03/16/16   Margarita Rana, MD  metFORMIN (GLUCOPHAGE) 500 MG tablet TAKE 1 TABLET TWICE DAILY WITH A MEAL 06/10/16   Birdie Sons, MD  mirtazapine  (REMERON) 7.5 MG tablet Take 1 tablet (7.5 mg total) by mouth at bedtime. 07/07/16   Birdie Sons, MD  pantoprazole (PROTONIX) 40 MG tablet TAKE 1 TABLET EVERY DAY 06/10/16   Birdie Sons, MD  Probiotic Product (PROBIOTIC DAILY) CAPS Take 1 capsule by mouth daily. 12/10/15   Margarita Rana, MD  sacubitril-valsartan (ENTRESTO) 24-26 MG Take 1 tablet by mouth 2 (two) times daily. 05/09/16   Alisa Graff, FNP  senna (SENOKOT) 8.6 MG TABS tablet Take 1 tablet by mouth daily as needed for mild constipation.    Historical Provider, MD  sitaGLIPtin (JANUVIA) 100 MG tablet Take 1 tablet (100 mg total) by mouth daily. 05/09/16   Birdie Sons, MD  SitaGLIPtin-MetFORMIN HCl [phone] MG TB24 Take 1 tablet by mouth daily. Replaces Januvia and metformin Patient not taking: Reported on 07/12/2016 07/07/16   Birdie Sons, MD  spironolactone (ALDACTONE) 25 MG tablet Take 1 tablet (25 mg total) by mouth daily. 12/17/15   Alisa Graff, FNP  SYNTHROID 50 MCG tablet TAKE 1 TABLET EVERY DAY 06/15/16   Birdie Sons, MD  TRUEPLUS LANCETS 33G MISC CHECK BLOOD SUGAR ONE TIME A DAY   07/20/16   Birdie Sons, MD    Allergies Other and Sulfa antibiotics  Family History  Problem Relation Age of Onset  . Heart disease Mother   . Heart attack Mother   . Prostate cancer Father   . Ulcers Father   . Stroke Sister   . Heart attack Sister   . Diabetes Sister   . Heart attack Brother   . Stroke Brother     Social History Social History  Substance Use Topics  . Smoking status: Never Smoker  . Smokeless tobacco: Never Used  . Alcohol use No    Review of Systems  Constitutional: Negative for fever. Cardiovascular: Positive for chest pressure.  Respiratory: Negative for shortness of breath. Gastrointestinal: Negative for abdominal pain, vomiting and diarrhea. Genitourinary: Negative for dysuria. Musculoskeletal: Positive for left arm pain and neck pain. Skin: Negative for rash. Neurological: Negative for  headaches, focal weakness or numbness.  10-point ROS otherwise negative.  ____________________________________________   PHYSICAL EXAM:  VITAL SIGNS: ED Triage Vitals  Enc Vitals Group     BP 07/30/16 1535 120/67     Pulse Rate 07/30/16 1535 79     Resp 07/30/16 1535 20     Temp 07/30/16 1535 98.1 F (36.7 C)     Temp Source 07/30/16 1535 Oral     SpO2 07/30/16 1535 100 %     Weight --      Height --      Head Circumference --      Peak Flow --      Pain Score 07/30/16 1536 5   Constitutional: Alert and oriented. Well appearing and in no distress. Eyes: Conjunctivae are normal. Normal extraocular movements. ENT  Head: Normocephalic and atraumatic.   Nose: No congestion/rhinnorhea.   Mouth/Throat: Mucous membranes are moist.   Neck: No stridor. Hematological/Lymphatic/Immunilogical: No cervical lymphadenopathy. Cardiovascular: Normal rate, regular rhythm.  No murmurs, rubs, or gallops. Respiratory: Normal respiratory effort without tachypnea nor retractions. Breath sounds are clear and equal bilaterally. No wheezes/rales/rhonchi. Gastrointestinal: Soft and nontender. No distention.  Genitourinary: Deferred Musculoskeletal: Normal range of motion in all extremities. No lower extremity edema. Neurologic:  Normal speech and language. No gross focal neurologic deficits are appreciated.  Skin:  Skin is warm, dry and intact. No rash noted. Psychiatric: Mood and affect are normal. Speech and behavior are normal. Patient exhibits appropriate insight and judgment.  ____________________________________________    LABS (pertinent positives/negatives)  Labs Reviewed  BASIC METABOLIC PANEL - Abnormal; Notable for the following:       Result Value   Glucose, Bld 145 (*)    Anion gap 3 (*)    All other components within normal limits  CBC - Abnormal; Notable for the following:    RBC 3.71 (*)    Hemoglobin 11.4 (*)    HCT 32.9 (*)    Platelets 117 (*)    All  other components within normal limits  TROPONIN I     ____________________________________________   EKG  I, Nance Pear, attending physician, personally viewed and interpreted this EKG  EKG Time: 1543 Rate: 82 Rhythm: atrial sensed ventricular paced rhythm Axis: normal Intervals: qtc 476 QRS: narrow ST changes: no st elevation Impression: abnormal ekg   ____________________________________________    RADIOLOGY  CXR IMPRESSION:  Pacemaker leads as described. Heart slightly enlarged. No edema or  consolidation.     I, Lisseth Brazeau, personally viewed and evaluated these images (plain radiographs) as part of my medical decision making. ____________________________________________   PROCEDURES  Procedures  ____________________________________________   INITIAL IMPRESSION / ASSESSMENT AND PLAN / ED COURSE  Pertinent labs & imaging results that were available during my care of the patient were reviewed by me and considered in my medical decision making (see chart for details).  Patient presented to the emergency department today because of concerns for left arm and neck pain. My exam patient appeared well. She had had right neck pain for the past week. It is worse with movement of her neck. I think likely the patient is suffering from musculoskeletal causes. Did however check 2 sets of troponin both were negative. Will discharge to follow up with primary and cardiologist. ____________________________________________   FINAL CLINICAL IMPRESSION(S) / ED DIAGNOSES  Final diagnoses:  Nonspecific chest pain     Note: This dictation was prepared with Dragon dictation. Any transcriptional errors that result from this process are unintentional    Nance Pear, MD 07/30/16 2029

## 2016-09-05 LAB — HM DIABETES EYE EXAM

## 2016-09-07 ENCOUNTER — Other Ambulatory Visit: Payer: Self-pay | Admitting: Family Medicine

## 2016-09-07 ENCOUNTER — Encounter: Payer: Self-pay | Admitting: *Deleted

## 2016-09-07 DIAGNOSIS — E119 Type 2 diabetes mellitus without complications: Secondary | ICD-10-CM

## 2016-09-08 ENCOUNTER — Encounter: Payer: Self-pay | Admitting: Family Medicine

## 2016-09-08 ENCOUNTER — Ambulatory Visit (INDEPENDENT_AMBULATORY_CARE_PROVIDER_SITE_OTHER): Payer: Medicare PPO | Admitting: Family Medicine

## 2016-09-08 VITALS — BP 92/60 | Temp 97.5°F | Resp 16 | Wt 138.0 lb

## 2016-09-08 DIAGNOSIS — E119 Type 2 diabetes mellitus without complications: Secondary | ICD-10-CM

## 2016-09-08 DIAGNOSIS — I5022 Chronic systolic (congestive) heart failure: Secondary | ICD-10-CM | POA: Diagnosis not present

## 2016-09-08 DIAGNOSIS — I10 Essential (primary) hypertension: Secondary | ICD-10-CM | POA: Diagnosis not present

## 2016-09-08 LAB — POCT UA - MICROALBUMIN: Microalbumin Ur, POC: NEGATIVE mg/L

## 2016-09-08 LAB — POCT GLYCOSYLATED HEMOGLOBIN (HGB A1C)
Est. average glucose Bld gHb Est-mCnc: 171
Hemoglobin A1C: 7.6

## 2016-09-08 NOTE — Progress Notes (Signed)
Patient: Bethany [name] Female    DOB: [birth year]   70 y.o.   MRN: [id number] Visit Date: 09/08/2016  Today's Provider: [name], MD   Chief Complaint  Patient presents with  . Diabetes   Subjective:    HPI  Diabetes Mellitus Type II, Follow-up:   Lab Results  Component Value Date   HGBA1C 7.6 09/08/2016   HGBA1C 7.0 07/07/2016   HGBA1C 8.4 03/15/2016    Last seen for diabetes 3 months ago.  Management since then includes no changes. She reports good compliance with treatment. She is not having side effects.  Current symptoms include none and have been stable. Home blood sugar records:   Episodes of hypoglycemia? no   Current Insulin Regimen: none Most Recent Eye Exam: last 2 years Weight trend: stable Prior visit with dietician: no Current diet: well balanced Current exercise: walking  Pertinent Labs:    Component Value Date/Time   CHOL 190 11/13/2015 0925   CHOL 130 02/21/2014 0014   TRIG 74 11/13/2015 0925   TRIG 94 02/21/2014 0014   HDL 72 11/13/2015 0925   HDL 42 02/21/2014 0014   LDLCALC 103 (H) 11/13/2015 0925   LDLCALC 69 02/21/2014 0014   CREATININE 0.89 07/30/2016 1543   CREATININE 0.70 02/04/2015 1426    Wt Readings from Last 3 Encounters:  09/08/16 138 lb (62.6 kg)  07/12/16 135 lb (61.2 kg)  07/07/16 137 lb (62.1 kg)   Patient also mentions that she received a letter from her insurance saying that she is due for a urine micro albumin test.     Allergies  Allergen Reactions  . Other Other (See Comments)    NO BLOOD PRODUCTS- JEHOVAHS WITNESS  . Sulfa Antibiotics Other (See Comments)    Loss of taste     Current Outpatient Prescriptions:  .  acetaminophen (TYLENOL) 500 MG tablet, Take 1,000 mg by mouth 2 (two) times daily as needed for mild pain or moderate pain., Disp: , Rfl:  .  Alcohol Swabs (B-D SINGLE USE SWABS REGULAR) PADS, CHECK BLOOD SUGAR DAILY, Disp: 100 each, Rfl: 2 .  aspirin EC 81 MG tablet, Take 81 mg by  mouth daily., Disp: , Rfl:  .  carvedilol (COREG) 6.25 MG tablet, Take 1 tablet by mouth 2 (two) times daily., Disp: , Rfl:  .  ferrous sulfate 325 (65 FE) MG tablet, Take 325 mg by mouth 2 (two) times daily with a meal., Disp: , Rfl:  .  furosemide (LASIX) 20 MG tablet, Take 1 tablet (20 mg total) by mouth daily., Disp: 90 tablet, Rfl: 3 .  glucose blood (TRUE METRIX BLOOD GLUCOSE TEST) test strip, To check blood sugar once a day.  DX E11.9, Disp: 100 each, Rfl: 1 .  mirtazapine (REMERON) 7.5 MG tablet, Take 1 tablet (7.5 mg total) by mouth at bedtime., Disp: 90 tablet, Rfl: 3 .  pantoprazole (PROTONIX) 40 MG tablet, TAKE 1 TABLET EVERY DAY, Disp: 90 tablet, Rfl: 3 .  sacubitril-valsartan (ENTRESTO) 24-26 MG, Take 1 tablet by mouth 2 (two) times daily., Disp: 180 tablet, Rfl: 3 .  senna (SENOKOT) 8.6 MG TABS tablet, Take 1 tablet by mouth daily as needed for mild constipation., Disp: , Rfl:  .  SitaGLIPtin-MetFORMIN HCl [phone] MG TB24, Take 1 tablet by mouth daily. Replaces Januvia and metformin, Disp: 90 tablet, Rfl: 3 .  spironolactone (ALDACTONE) 25 MG tablet, Take 1 tablet (25 mg total) by mouth daily., Disp: 90 tablet, Rfl:  3 .  SYNTHROID 50 MCG tablet, TAKE 1 TABLET EVERY DAY, Disp: 90 tablet, Rfl: 3 .  TRUEPLUS LANCETS 33G MISC, CHECK BLOOD SUGAR ONE TIME A DAY  , Disp: 100 each, Rfl: 4 .  ALPRAZolam (XANAX) 0.5 MG tablet, Take 1 tablet (0.5 mg total) by mouth at bedtime as needed for anxiety. (Patient not taking: Reported on 09/08/2016), Disp: 30 tablet, Rfl: 0 .  metFORMIN (GLUCOPHAGE) 500 MG tablet, TAKE 1 TABLET TWICE DAILY WITH A MEAL (Patient not taking: Reported on 09/08/2016), Disp: 180 tablet, Rfl: 3 .  Probiotic Product (PROBIOTIC DAILY) CAPS, Take 1 capsule by mouth daily. (Patient not taking: Reported on 09/08/2016), Disp: 90 capsule, Rfl: 1 .  sitaGLIPtin (JANUVIA) 100 MG tablet, Take 1 tablet (100 mg total) by mouth daily. (Patient not taking: Reported on 09/08/2016), Disp: 30  tablet, Rfl: 5  Review of Systems  Constitutional: Negative.   Respiratory: Negative.   Cardiovascular: Negative.   Endocrine: Negative.   Genitourinary: Negative.   Neurological: Negative.   Psychiatric/Behavioral: Negative.     Social History  Substance Use Topics  . Smoking status: Never Smoker  . Smokeless tobacco: Never Used  . Alcohol use No   Objective:   BP 92/60 (BP Location: Right Arm, Patient Position: Sitting, Cuff Size: Normal)   Temp 97.5 F (36.4 C)   Resp 16   Wt 138 lb (62.6 kg)   LMP  (LMP Unknown)   BMI 26.07 kg/m   Physical Exam   General Appearance:    Alert, cooperative, no distress  Eyes:    PERRL, conjunctiva/corneas clear, EOM's intact       Lungs:     Clear to auscultation bilaterally, respirations unlabored  Heart:    Regular rate and rhythm  Neurologic:   Awake, alert, oriented x 3. No apparent focal neurological           defect.       Results for orders placed or performed in visit on 09/08/16  POCT glycosylated hemoglobin (Hb A1C)  Result Value Ref Range   Hemoglobin A1C 7.6    Est. average glucose Bld gHb Est-mCnc 171   POCT UA - Microalbumin  Result Value Ref Range   Microalbumin Ur, POC negative mg/L   Creatinine, POC  mg/dL   Albumin/Creatinine Ratio, Urine, POC         Assessment & Plan:      1. Type 2 diabetes mellitus without complication, without long-term current use of insulin (HCC) Well controlled.  Continue current medications.   - POCT glycosylated hemoglobin (Hb A1C) - POCT UA - Microalbumin  2. Chronic systolic heart failure (HCC) Doing well on current medications. Continue regular follow up at CHF clnic  3. Benign essential HTN Well controlled.  Continue current medications.    Return in about 14 weeks (around 12/15/2016) for diabetes.       Mila Merryonald Fisher, MD  [facility]

## 2016-09-14 ENCOUNTER — Other Ambulatory Visit: Payer: Self-pay | Admitting: Family Medicine

## 2016-09-14 DIAGNOSIS — E119 Type 2 diabetes mellitus without complications: Secondary | ICD-10-CM

## 2016-09-19 ENCOUNTER — Other Ambulatory Visit: Payer: Self-pay | Admitting: Family Medicine

## 2016-09-19 DIAGNOSIS — E119 Type 2 diabetes mellitus without complications: Secondary | ICD-10-CM

## 2016-09-20 ENCOUNTER — Ambulatory Visit (INDEPENDENT_AMBULATORY_CARE_PROVIDER_SITE_OTHER): Payer: Medicare PPO | Admitting: *Deleted

## 2016-09-20 DIAGNOSIS — I5022 Chronic systolic (congestive) heart failure: Secondary | ICD-10-CM | POA: Diagnosis not present

## 2016-09-20 DIAGNOSIS — I428 Other cardiomyopathies: Secondary | ICD-10-CM

## 2016-09-20 NOTE — Progress Notes (Signed)
Remote ICD transmission.   

## 2016-09-30 LAB — CUP PACEART REMOTE DEVICE CHECK
Battery Remaining Longevity: 55 mo
Battery Voltage: 2.97 V
Brady Statistic AP VS Percent: 0.01 %
Brady Statistic AS VP Percent: 98.73 %
Brady Statistic AS VS Percent: 1.23 %
Brady Statistic RA Percent Paced: 0.04 %
Brady Statistic RV Percent Paced: 7.5 %
Date Time Interrogation Session: 20171114072824
HighPow Impedance: 57 Ohm
Implantable Lead Implant Date: 20160409
Implantable Lead Implant Date: 20160409
Implantable Lead Location: 753858
Implantable Lead Model: 4598
Implantable Lead Model: 5076
Lead Channel Impedance Value: 418 Ohm
Lead Channel Impedance Value: 456 Ohm
Lead Channel Impedance Value: 551 Ohm
Lead Channel Impedance Value: 551 Ohm
Lead Channel Impedance Value: 722 Ohm
Lead Channel Impedance Value: 874 Ohm
Lead Channel Impedance Value: 893 Ohm
Lead Channel Impedance Value: 931 Ohm
Lead Channel Pacing Threshold Amplitude: 0.75 V
Lead Channel Pacing Threshold Amplitude: 2.25 V
Lead Channel Pacing Threshold Pulse Width: 0.4 ms
Lead Channel Pacing Threshold Pulse Width: 0.4 ms
Lead Channel Pacing Threshold Pulse Width: 0.8 ms
Lead Channel Sensing Intrinsic Amplitude: 0.625 mV
Lead Channel Sensing Intrinsic Amplitude: 10.25 mV
Lead Channel Sensing Intrinsic Amplitude: 10.25 mV
Lead Channel Setting Pacing Amplitude: 3.25 V
Lead Channel Setting Pacing Pulse Width: 0.8 ms
Lead Channel Setting Sensing Sensitivity: 0.3 mV
MDC IDC LEAD IMPLANT DT: 20160409
MDC IDC LEAD LOCATION: 753859
MDC IDC LEAD LOCATION: 753860
MDC IDC MSMT LEADCHNL LV IMPEDANCE VALUE: 1026 Ohm
MDC IDC MSMT LEADCHNL LV IMPEDANCE VALUE: 1064 Ohm
MDC IDC MSMT LEADCHNL LV IMPEDANCE VALUE: 988 Ohm
MDC IDC MSMT LEADCHNL RA PACING THRESHOLD AMPLITUDE: 0.375 V
MDC IDC MSMT LEADCHNL RA SENSING INTR AMPL: 0.625 mV
MDC IDC MSMT LEADCHNL RV IMPEDANCE VALUE: 361 Ohm
MDC IDC MSMT LEADCHNL RV IMPEDANCE VALUE: 475 Ohm
MDC IDC PG IMPLANT DT: 20160409
MDC IDC SET LEADCHNL RA PACING AMPLITUDE: 1.5 V
MDC IDC SET LEADCHNL RV PACING AMPLITUDE: 2 V
MDC IDC SET LEADCHNL RV PACING PULSEWIDTH: 0.4 ms
MDC IDC STAT BRADY AP VP PERCENT: 0.03 %

## 2016-10-03 ENCOUNTER — Ambulatory Visit: Payer: Medicare PPO | Admitting: Physician Assistant

## 2016-10-03 ENCOUNTER — Ambulatory Visit (INDEPENDENT_AMBULATORY_CARE_PROVIDER_SITE_OTHER): Payer: Medicare PPO | Admitting: Family Medicine

## 2016-10-03 ENCOUNTER — Encounter: Payer: Self-pay | Admitting: Family Medicine

## 2016-10-03 VITALS — BP 90/60 | HR 72 | Temp 97.7°F | Resp 16 | Wt 138.0 lb

## 2016-10-03 DIAGNOSIS — J069 Acute upper respiratory infection, unspecified: Secondary | ICD-10-CM | POA: Diagnosis not present

## 2016-10-03 DIAGNOSIS — B9789 Other viral agents as the cause of diseases classified elsewhere: Secondary | ICD-10-CM | POA: Diagnosis not present

## 2016-10-03 NOTE — Progress Notes (Signed)
Patient: Bethany [name] Female    DOB: 06/29/1946   70 y.o.   MRN: [id number] Visit Date: 10/03/2016  Today's Provider: [name], MD   Chief Complaint  Patient presents with  . Chills    x 2-3 days   Subjective:    HPI  Chills: Patient presents today reporting having chills, body aches, nausea, fatigue and scratchy throat for 2-3 days. Patient states symptoms have worsened since onset. She has also had sharp pain near her belly button, decrease in appetite, and changes in her bowel habits (stools fluctuate between loose to hard stool). Patient states she feels worn out with no energy. Patient denies, vomiting, fever or  Headache.     Allergies  Allergen Reactions  . Other Other (See Comments)    NO BLOOD PRODUCTS- JEHOVAHS WITNESS  . Sulfa Antibiotics Other (See Comments)    Loss of taste     Current Outpatient Prescriptions:  .  acetaminophen (TYLENOL) 500 MG tablet, Take 1,000 mg by mouth 2 (two) times daily as needed for mild pain or moderate pain., Disp: , Rfl:  .  Alcohol Swabs (B-D SINGLE USE SWABS REGULAR) PADS, CHECK BLOOD SUGAR DAILY, Disp: 100 each, Rfl: 2 .  aspirin EC 81 MG tablet, Take 81 mg by mouth daily., Disp: , Rfl:  .  carvedilol (COREG) 6.25 MG tablet, Take 1 tablet by mouth 2 (two) times daily., Disp: , Rfl:  .  ferrous sulfate 325 (65 FE) MG tablet, Take 325 mg by mouth 2 (two) times daily with a meal., Disp: , Rfl:  .  furosemide (LASIX) 20 MG tablet, Take 1 tablet (20 mg total) by mouth daily., Disp: 90 tablet, Rfl: 3 .  mirtazapine (REMERON) 7.5 MG tablet, Take 1 tablet (7.5 mg total) by mouth at bedtime., Disp: 90 tablet, Rfl: 3 .  pantoprazole (PROTONIX) 40 MG tablet, TAKE 1 TABLET EVERY DAY, Disp: 90 tablet, Rfl: 3 .  sacubitril-valsartan (ENTRESTO) 24-26 MG, Take 1 tablet by mouth 2 (two) times daily., Disp: 180 tablet, Rfl: 3 .  senna (SENOKOT) 8.6 MG TABS tablet, Take 1 tablet by mouth daily as needed for mild constipation., Disp: ,  Rfl:  .  sitaGLIPtin (JANUVIA) 100 MG tablet, Take 1 tablet (100 mg total) by mouth daily., Disp: 30 tablet, Rfl: 5 .  SitaGLIPtin-MetFORMIN HCl [phone] MG TB24, Take 1 tablet by mouth daily. Replaces Januvia and metformin, Disp: 90 tablet, Rfl: 3 .  spironolactone (ALDACTONE) 25 MG tablet, Take 1 tablet (25 mg total) by mouth daily., Disp: 90 tablet, Rfl: 3 .  SYNTHROID 50 MCG tablet, TAKE 1 TABLET EVERY DAY, Disp: 90 tablet, Rfl: 3 .  TRUE METRIX BLOOD GLUCOSE TEST test strip, CHECK BLOOD SUGAR DAILY, Disp: 100 each, Rfl: 1 .  TRUEPLUS LANCETS 33G MISC, CHECK BLOOD SUGAR ONE TIME A DAY  , Disp: 100 each, Rfl: 4 .  ALPRAZolam (XANAX) 0.5 MG tablet, Take 1 tablet (0.5 mg total) by mouth at bedtime as needed for anxiety. (Patient not taking: Reported on 10/03/2016), Disp: 30 tablet, Rfl: 0  Review of Systems  Constitutional: Positive for appetite change, chills and fatigue. Negative for fever.  HENT:       Scratchy throat  Respiratory: Negative for cough, chest tightness and shortness of breath.   Cardiovascular: Negative for chest pain and palpitations.  Gastrointestinal: Positive for abdominal distention, abdominal pain, constipation, diarrhea and nausea. Negative for anal bleeding, blood in stool, rectal pain and vomiting.  Endocrine:  Positive for cold intolerance.  Genitourinary: Negative for decreased urine volume, difficulty urinating, dyspareunia, dysuria, enuresis, flank pain, frequency, genital sores, hematuria, menstrual problem, pelvic pain, urgency, vaginal bleeding, vaginal discharge and vaginal pain.  Neurological: Negative for dizziness, weakness and headaches.    Social History  Substance Use Topics  . Smoking status: Never Smoker  . Smokeless tobacco: Never Used  . Alcohol use No   Objective:   BP 90/60 (BP Location: Right Arm, Patient Position: Sitting, Cuff Size: Normal)   Pulse 72   Temp 97.7 F (36.5 C) (Oral)   Resp 16   Wt 138 lb (62.6 kg)   LMP  (LMP Unknown)    SpO2 98% Comment: room air  BMI 26.07 kg/m   Physical Exam  General Appearance:    Alert, cooperative, no distress  HENT:   bilateral, generalized TM normal without fluid or infection, neck without nodes, pharynx erythematous without exudate and sinuses nontender  Eyes:    PERRL, conjunctiva/corneas clear, EOM's intact       Lungs:     Clear to auscultation bilaterally, respirations unlabored  Heart:    Regular rate and rhythm  Neurologic:   Awake, alert, oriented x 3. No apparent focal neurological           defect.   abd:   Soft, non-tender, non-distended.        Assessment & Plan:     1. Viral upper respiratory tract infection Counseled regarding signs and symptoms of viral and bacterial respiratory infections. Advised to call or return for additional evaluation if she develops any sign of bacterial infection, or if current symptoms last longer than 10 days.       The entirety of the information documented in the History of Present Illness, Review of Systems and Physical Exam were personally obtained by me. Portions of this information were initially documented by Awilda Billoshena Chambers, CMA and reviewed by me for thoroughness and accuracy.    Mila Merryonald Fisher, MD  [facility]

## 2016-10-20 ENCOUNTER — Encounter: Payer: Self-pay | Admitting: *Deleted

## 2016-10-27 ENCOUNTER — Ambulatory Visit: Payer: Medicare PPO | Admitting: Anesthesiology

## 2016-10-27 ENCOUNTER — Encounter: Payer: Self-pay | Admitting: *Deleted

## 2016-10-27 ENCOUNTER — Ambulatory Visit
Admission: RE | Admit: 2016-10-27 | Discharge: 2016-10-27 | Disposition: A | Discharge status: Home / Self Care | Payer: Medicare PPO | Source: Ambulatory Visit | Attending: Ophthalmology | Admitting: Ophthalmology

## 2016-10-27 ENCOUNTER — Encounter
Admission: RE | Disposition: A | Discharge status: Home / Self Care | Payer: Self-pay | Source: Ambulatory Visit | Attending: Ophthalmology

## 2016-10-27 DIAGNOSIS — I110 Hypertensive heart disease with heart failure: Secondary | ICD-10-CM | POA: Insufficient documentation

## 2016-10-27 DIAGNOSIS — K219 Gastro-esophageal reflux disease without esophagitis: Secondary | ICD-10-CM | POA: Diagnosis not present

## 2016-10-27 DIAGNOSIS — F329 Major depressive disorder, single episode, unspecified: Secondary | ICD-10-CM | POA: Insufficient documentation

## 2016-10-27 DIAGNOSIS — E1136 Type 2 diabetes mellitus with diabetic cataract: Secondary | ICD-10-CM | POA: Diagnosis present

## 2016-10-27 DIAGNOSIS — Z7984 Long term (current) use of oral hypoglycemic drugs: Secondary | ICD-10-CM | POA: Insufficient documentation

## 2016-10-27 DIAGNOSIS — I509 Heart failure, unspecified: Secondary | ICD-10-CM | POA: Insufficient documentation

## 2016-10-27 DIAGNOSIS — D649 Anemia, unspecified: Secondary | ICD-10-CM | POA: Diagnosis not present

## 2016-10-27 DIAGNOSIS — F419 Anxiety disorder, unspecified: Secondary | ICD-10-CM | POA: Insufficient documentation

## 2016-10-27 DIAGNOSIS — R0601 Orthopnea: Secondary | ICD-10-CM | POA: Diagnosis not present

## 2016-10-27 DIAGNOSIS — D696 Thrombocytopenia, unspecified: Secondary | ICD-10-CM | POA: Insufficient documentation

## 2016-10-27 HISTORY — DX: Other specified postprocedural states: Z98.890

## 2016-10-27 HISTORY — DX: Dyspnea, unspecified: R06.00

## 2016-10-27 HISTORY — DX: Hypothyroidism, unspecified: E03.9

## 2016-10-27 HISTORY — DX: Anemia, unspecified: D64.9

## 2016-10-27 HISTORY — DX: Other complications of anesthesia, initial encounter: T88.59XA

## 2016-10-27 HISTORY — PX: CATARACT EXTRACTION W/PHACO: SHX586

## 2016-10-27 HISTORY — DX: Adverse effect of unspecified anesthetic, initial encounter: T41.45XA

## 2016-10-27 HISTORY — DX: Orthopnea: R06.01

## 2016-10-27 HISTORY — DX: Edema, unspecified: R60.9

## 2016-10-27 HISTORY — DX: Presence of automatic (implantable) cardiac defibrillator: Z95.810

## 2016-10-27 HISTORY — DX: Other specified postprocedural states: R11.2

## 2016-10-27 LAB — GLUCOSE, CAPILLARY: GLUCOSE-CAPILLARY: 143 mg/dL — AB (ref 65–99)

## 2016-10-27 SURGERY — PHACOEMULSIFICATION, CATARACT, WITH IOL INSERTION
Anesthesia: Monitor Anesthesia Care | Site: Eye | Laterality: Left | Wound class: Clean

## 2016-10-27 MED ORDER — FENTANYL CITRATE (PF) 100 MCG/2ML IJ SOLN
INTRAMUSCULAR | Status: AC
  Filled 2016-10-27: qty 2

## 2016-10-27 MED ORDER — MOXIFLOXACIN HCL 0.5 % OP SOLN
OPHTHALMIC | Status: AC
  Administered 2016-10-27: 1 [drp] via OPHTHALMIC
  Filled 2016-10-27: qty 3

## 2016-10-27 MED ORDER — ARMC OPHTHALMIC DILATING DROPS
1.0000 "application " | OPHTHALMIC | Status: AC
  Administered 2016-10-27 (×3): 1 via OPHTHALMIC

## 2016-10-27 MED ORDER — MIDAZOLAM HCL 2 MG/2ML IJ SOLN
INTRAMUSCULAR | Status: AC
  Filled 2016-10-27: qty 2

## 2016-10-27 MED ORDER — ONDANSETRON HCL 4 MG/2ML IJ SOLN
INTRAMUSCULAR | Status: AC
  Filled 2016-10-27: qty 2

## 2016-10-27 MED ORDER — ARMC OPHTHALMIC DILATING DROPS
OPHTHALMIC | Status: AC
  Administered 2016-10-27: 1 via OPHTHALMIC
  Filled 2016-10-27: qty 0.4

## 2016-10-27 MED ORDER — NEOMYCIN-POLYMYXIN-DEXAMETH 0.1 % OP OINT
TOPICAL_OINTMENT | OPHTHALMIC | Status: DC | PRN
  Administered 2016-10-27: 1 via OPHTHALMIC

## 2016-10-27 MED ORDER — ONDANSETRON HCL 4 MG/2ML IJ SOLN
INTRAMUSCULAR | Status: DC | PRN
  Administered 2016-10-27: 4 mg via INTRAVENOUS

## 2016-10-27 MED ORDER — MIDAZOLAM HCL 2 MG/2ML IJ SOLN
INTRAMUSCULAR | Status: DC | PRN
  Administered 2016-10-27: 1 mg via INTRAVENOUS

## 2016-10-27 MED ORDER — SODIUM CHLORIDE 0.9 % IV SOLN
INTRAVENOUS | Status: DC
  Administered 2016-10-27: 50 mL/h via INTRAVENOUS

## 2016-10-27 MED ORDER — CARBACHOL 0.01 % IO SOLN
INTRAOCULAR | Status: DC | PRN
  Administered 2016-10-27: 0.5 mL via INTRAOCULAR

## 2016-10-27 MED ORDER — MOXIFLOXACIN HCL 0.5 % OP SOLN
1.0000 [drp] | OPHTHALMIC | Status: AC
  Administered 2016-10-27 (×3): 1 [drp] via OPHTHALMIC

## 2016-10-27 MED ORDER — FENTANYL CITRATE (PF) 100 MCG/2ML IJ SOLN
INTRAMUSCULAR | Status: DC | PRN
  Administered 2016-10-27: 50 ug via INTRAVENOUS

## 2016-10-27 MED ORDER — EPINEPHRINE PF 1 MG/ML IJ SOLN
INTRAMUSCULAR | Status: DC | PRN
  Administered 2016-10-27: 10:00:00 via OPHTHALMIC

## 2016-10-27 MED ORDER — NA HYALUR & NA CHOND-NA HYALUR 0.4-0.35 ML IO KIT
PACK | INTRAOCULAR | Status: DC | PRN
  Administered 2016-10-27: .35 mL via INTRAOCULAR

## 2016-10-27 MED ORDER — ATROPINE SULFATE 0.4 MG/ML IV SOSY
PREFILLED_SYRINGE | INTRAVENOUS | Status: AC
  Filled 2016-10-27: qty 2.5

## 2016-10-27 MED ORDER — LIDOCAINE HCL (PF) 4 % IJ SOLN
INTRAOCULAR | Status: DC | PRN
  Administered 2016-10-27: 4 mL via OPHTHALMIC

## 2016-10-27 SURGICAL SUPPLY — 22 items
CANNULA ANT/CHMB 27GA (MISCELLANEOUS) ×2 IMPLANT
CUP MEDICINE 2OZ PLAST GRAD ST (MISCELLANEOUS) ×2 IMPLANT
GLOVE BIO SURGEON STRL SZ8 (GLOVE) ×2 IMPLANT
GLOVE BIOGEL M 6.5 STRL (GLOVE) ×2 IMPLANT
GLOVE SURG LX 7.5 STRW (GLOVE) ×1
GLOVE SURG LX STRL 7.5 STRW (GLOVE) ×1 IMPLANT
GOWN STRL REUS W/ TWL LRG LVL3 (GOWN DISPOSABLE) ×2 IMPLANT
GOWN STRL REUS W/TWL LRG LVL3 (GOWN DISPOSABLE) ×2
LENS IOL TECNIS 23.5 (Intraocular Lens) ×2 IMPLANT
LENS IOL TECNIS MONO 1P 23.5 (Intraocular Lens) ×1 IMPLANT
PACK CATARACT (MISCELLANEOUS) ×2 IMPLANT
PACK CATARACT BRASINGTON LX (MISCELLANEOUS) ×2 IMPLANT
PACK EYE AFTER SURG (MISCELLANEOUS) ×2 IMPLANT
SOL BSS BAG (MISCELLANEOUS) ×2
SOL PREP PVP 2OZ (MISCELLANEOUS) ×2
SOLUTION BSS BAG (MISCELLANEOUS) ×1 IMPLANT
SOLUTION PREP PVP 2OZ (MISCELLANEOUS) ×1 IMPLANT
SYR 3ML LL SCALE MARK (SYRINGE) ×2 IMPLANT
SYR 5ML LL (SYRINGE) ×2 IMPLANT
SYR TB 1ML 27GX1/2 LL (SYRINGE) ×2 IMPLANT
WATER STERILE IRR 250ML POUR (IV SOLUTION) ×2 IMPLANT
WIPE NON LINTING 3.25X3.25 (MISCELLANEOUS) ×2 IMPLANT

## 2016-10-27 NOTE — Transfer of Care (Signed)
Immediate Anesthesia Transfer of Care Note  Patient: Bethany [name]  Procedure(s) Performed: Procedure(s) with comments: CATARACT EXTRACTION PHACO AND INTRAOCULAR LENS PLACEMENT (IOC) (Left) - [place]  01:12 AP%  21.0 CDE  11.59 Fluid pack lot # [id number]  Patient Location: PACU and Short Stay  Anesthesia Type:MAC  Level of Consciousness: awake  Airway & Oxygen Therapy: Patient Spontanous Breathing  Post-op Assessment: Report given to RN and Post -op Vital signs reviewed and stable  Post vital signs: Reviewed and stable  Last Vitals:  Vitals:   10/27/16 0843 10/27/16 0844  BP:  113/64  Pulse:  66  Resp:  16  Temp: 36.6 C     Last Pain:  Vitals:   10/27/16 0843  TempSrc: Tympanic      Patients Stated Pain Goal: 0 (10/27/16 0844)  Complications: No apparent anesthesia complications

## 2016-10-27 NOTE — Anesthesia Procedure Notes (Signed)
Procedure Name: MAC Date/Time: 10/27/2016 9:52 AM Performed by: Henrietta Hoover Pre-anesthesia Checklist: Patient identified, Emergency Drugs available, Suction available, Patient being monitored and Timeout performed Patient Re-evaluated:Patient Re-evaluated prior to inductionOxygen Delivery Method: Nasal cannula

## 2016-10-27 NOTE — Op Note (Signed)
OPERATIVE NOTE  ALDORA DONARSKI [id number] 10/27/2016   PREOPERATIVE DIAGNOSIS:  Nuclear sclerotic cataract left eye. H25.12   POSTOPERATIVE DIAGNOSIS:    Nuclear sclerotic cataract left eye.     PROCEDURE:  Phacoemusification with posterior chamber intraocular lens placement of the left eye   LENS:   Implant Name Type Inv. Item Serial No. Manufacturer Lot No. LRB No. Used  LENS IOL TECNIS 23.5 - [id number] Intraocular Lens LENS IOL TECNIS 23.5 [id number] AMO   Left 1        ULTRASOUND TIME: 21 % of 1 minutes, 12 seconds.  CDE 11.6   SURGEON:  Deirdre Evener, MD   ANESTHESIA:  Topical with tetracaine drops and 2% Xylocaine jelly, augmented with 1% preservative-free intracameral lidocaine.    COMPLICATIONS:  None.   DESCRIPTION OF PROCEDURE:  The patient was identified in the holding room and transported to the operating room and placed in the supine position under the operating microscope.  The left eye was identified as the operative eye and it was prepped and draped in the usual sterile ophthalmic fashion.   A 1 millimeter clear-corneal paracentesis was made at the 1:30 position. 0.5 ml of preservative-free 1% lidocaine was injected into the anterior chamber.  The anterior chamber was filled with Viscoat viscoelastic.  A 2.4 millimeter keratome was used to make a near-clear corneal incision at the 10:30 position.  .  A curvilinear capsulorrhexis was made with a cystotome and capsulorrhexis forceps.  Balanced salt solution was used to hydrodissect and hydrodelineate the nucleus.   Phacoemulsification was then used in stop and chop fashion to remove the lens nucleus and epinucleus.  The remaining cortex was then removed using the irrigation and aspiration handpiece. Provisc was then placed into the capsular bag to distend it for lens placement.  A lens was then injected into the capsular bag.  The remaining viscoelastic was aspirated.   Wounds were hydrated with balanced  salt solution.  The anterior chamber was inflated to a physiologic pressure with balanced salt solution. Vigamox 0.2 ml of a 1mg  per ml solution was injected into the anterior chamber for a dose of 0.2 mg of intracameral antibiotic at the completion of the case.  Miostat was placed into the anterior chamber to constrict the pupil.  No wound leaks were noted.  Topical Vigamox drops and Maxitrol ointment were applied to the eye.  The patient was taken to the recovery room in stable condition without complications of anesthesia or surgery  Vinayak Bobier 10/27/2016, 10:07 AM

## 2016-10-27 NOTE — H&P (Signed)
The History and Physical notes are on paper, have been signed, and are to be scanned. The patient remains stable and unchanged from the H&P.   Previous H&P reviewed, patient examined, and there are no changes.  Bethany [name] 10/27/2016 9:17 AM

## 2016-10-27 NOTE — Anesthesia Postprocedure Evaluation (Signed)
Anesthesia Post Note  Patient: Bethany [name]  Procedure(s) Performed: Procedure(s) (LRB): CATARACT EXTRACTION PHACO AND INTRAOCULAR LENS PLACEMENT (IOC) (Left)  Patient location during evaluation: Short Stay Anesthesia Type: MAC Level of consciousness: awake Pain management: pain level controlled Vital Signs Assessment: post-procedure vital signs reviewed and stable Respiratory status: spontaneous breathing Cardiovascular status: blood pressure returned to baseline Postop Assessment: no headache Anesthetic complications: no     Last Vitals:  Vitals:   10/27/16 0844 10/27/16 1000  BP: 113/64 106/65  Pulse: 66 64  Resp: 16 16  Temp:  36.3 C    Last Pain:  Vitals:   10/27/16 0843  TempSrc: Tympanic                 Bethany [name]

## 2016-10-27 NOTE — Anesthesia Preprocedure Evaluation (Signed)
Anesthesia Evaluation  Patient identified by MRN, date of birth, ID band Patient awake    Reviewed: Allergy & Precautions, NPO status , Patient's Chart, lab work & pertinent test results  History of Anesthesia Complications (+) PONV  Airway Mallampati: I  TM Distance: >3 FB     Dental  (+) Missing, Caps   Pulmonary shortness of breath and with exertion,    Pulmonary exam normal        Cardiovascular hypertension, +CHF and + Orthopnea  Normal cardiovascular exam+ dysrhythmias + Cardiac Defibrillator + Valvular Problems/Murmurs      Neuro/Psych PSYCHIATRIC DISORDERS Anxiety Depression    GI/Hepatic GERD  Controlled,Abnormal liver function studies diverticulosis   Endo/Other  diabetes, Well Controlled, Type 2, Oral Hypoglycemic AgentsHypothyroidism   Renal/GU   negative genitourinary   Musculoskeletal   Abdominal Normal abdominal exam  (+)   Peds negative pediatric ROS (+)  Hematology  (+) anemia ,   Anesthesia Other Findings Past Medical History: No date: AICD (automatic cardioverter/defibrillator) pr* No date: Anemia No date: Anxiety No date: CHF (congestive heart failure) (HCC) No date: Complication of anesthesia No date: Depression No date: Diabetes (HCC) No date: Dilated cardiomyopathy (HCC) No date: Diverticulosis No date: Dyspnea No date: Edema     Comment: FEET/ANKLES OCCAS No date: GERD (gastroesophageal reflux disease) No date: Hyperlipidemia No date: Hypothyroidism No date: LV dysfunction No date: Orthopnea     Comment: USES 3 PILLOWS No date: PONV (postoperative nausea and vomiting) No date: Reflux No date: Thrombocytopenia (HCC) No date: Thyroid disease No date: VHD (valvular heart disease)  Reproductive/Obstetrics                             Anesthesia Physical Anesthesia Plan  ASA: IV  Anesthesia Plan: MAC   Post-op Pain Management:    Induction:  Intravenous  Airway Management Planned: Nasal Cannula  Additional Equipment:   Intra-op Plan:   Post-operative Plan:   Informed Consent: I have reviewed the patients History and Physical, chart, labs and discussed the procedure including the risks, benefits and alternatives for the proposed anesthesia with the patient or authorized representative who has indicated his/her understanding and acceptance.   Dental advisory given  Plan Discussed with: CRNA and Surgeon  Anesthesia Plan Comments:         Anesthesia Quick Evaluation

## 2016-11-03 ENCOUNTER — Ambulatory Visit: Payer: Medicare PPO | Admitting: Family Medicine

## 2016-11-03 ENCOUNTER — Ambulatory Visit (INDEPENDENT_AMBULATORY_CARE_PROVIDER_SITE_OTHER): Payer: Medicare PPO | Admitting: Family Medicine

## 2016-11-03 ENCOUNTER — Encounter: Payer: Self-pay | Admitting: Family Medicine

## 2016-11-03 VITALS — BP 108/62 | Temp 97.9°F | Resp 16 | Wt 138.0 lb

## 2016-11-03 DIAGNOSIS — R110 Nausea: Secondary | ICD-10-CM | POA: Diagnosis not present

## 2016-11-03 DIAGNOSIS — R1013 Epigastric pain: Secondary | ICD-10-CM

## 2016-11-03 LAB — POCT URINALYSIS DIPSTICK
Bilirubin, UA: NEGATIVE
Blood, UA: NEGATIVE
GLUCOSE UA: NEGATIVE
Leukocytes, UA: NEGATIVE
Nitrite, UA: NEGATIVE
Protein, UA: NEGATIVE
Spec Grav, UA: <=1.005
Urobilinogen, UA: 0.2
pH, UA: 6

## 2016-11-03 NOTE — Progress Notes (Signed)
Patient: Bethany [name] Female    DOB: 01-31-1946   70 y.o.   MRN: [id number] Visit Date: 11/03/2016  Today's Provider: [name], MD   Chief Complaint  Patient presents with  . Nausea   Subjective:    HPI Patient presents today c/o nausea. Patient reports that she has had symptoms X 1 week. Patient reports that she is unable to eat due to the nausea. Patient denies any vomiting. Patient denies any fever, but she has had sweats and chills. Patient reports that she had cataract surgery last week, and she feels this may have triggered the nausea due to her vision changes. Has mild diarrhea at times, no blood in stool. Mild epigastric pain. Worse after eating. Appetitie has been poor. No known exposure to GI illnesses. No recent dietary changes. Not taking any OTC NSAIDs.     Allergies  Allergen Reactions  . Other Other (See Comments)    NO BLOOD PRODUCTS- JEHOVAHS WITNESS  . Sulfa Antibiotics Other (See Comments)    Loss of taste     Current Outpatient Prescriptions:  .  acetaminophen (TYLENOL) 500 MG tablet, Take 1,000 mg by mouth 2 (two) times daily as needed for mild pain or moderate pain., Disp: , Rfl:  .  Alcohol Swabs (B-D SINGLE USE SWABS REGULAR) PADS, CHECK BLOOD SUGAR DAILY, Disp: 100 each, Rfl: 2 .  ALPRAZolam (XANAX) 0.5 MG tablet, Take 1 tablet (0.5 mg total) by mouth at bedtime as needed for anxiety., Disp: 30 tablet, Rfl: 0 .  aspirin EC 81 MG tablet, Take 81 mg by mouth daily., Disp: , Rfl:  .  carvedilol (COREG) 6.25 MG tablet, Take 1 tablet by mouth 2 (two) times daily., Disp: , Rfl:  .  ferrous sulfate 325 (65 FE) MG tablet, Take 325 mg by mouth 2 (two) times daily with a meal., Disp: , Rfl:  .  furosemide (LASIX) 20 MG tablet, Take 1 tablet (20 mg total) by mouth daily., Disp: 90 tablet, Rfl: 3 .  pantoprazole (PROTONIX) 40 MG tablet, TAKE 1 TABLET EVERY DAY, Disp: 90 tablet, Rfl: 3 .  sacubitril-valsartan (ENTRESTO) 24-26 MG, Take 1 tablet by mouth 2  (two) times daily., Disp: 180 tablet, Rfl: 3 .  senna (SENOKOT) 8.6 MG TABS tablet, Take 1 tablet by mouth daily as needed for mild constipation., Disp: , Rfl:  .  SitaGLIPtin-MetFORMIN HCl [phone] MG TB24, Take 1 tablet by mouth daily. Replaces Januvia and metformin, Disp: 90 tablet, Rfl: 3 .  spironolactone (ALDACTONE) 25 MG tablet, Take 1 tablet (25 mg total) by mouth daily., Disp: 90 tablet, Rfl: 3 .  SYNTHROID 50 MCG tablet, TAKE 1 TABLET EVERY DAY, Disp: 90 tablet, Rfl: 3 .  TRUE METRIX BLOOD GLUCOSE TEST test strip, CHECK BLOOD SUGAR DAILY, Disp: 100 each, Rfl: 1 .  TRUEPLUS LANCETS 33G MISC, CHECK BLOOD SUGAR ONE TIME A DAY  , Disp: 100 each, Rfl: 4 .  mirtazapine (REMERON) 7.5 MG tablet, Take 1 tablet (7.5 mg total) by mouth at bedtime. (Patient not taking: Reported on 11/03/2016), Disp: 90 tablet, Rfl: 3  Review of Systems  Constitutional: Positive for activity change, appetite change, chills and fatigue.  Gastrointestinal: Positive for nausea. Negative for abdominal pain, constipation and diarrhea.  Skin: Negative.   Neurological: Positive for weakness. Negative for dizziness.    Social History  Substance Use Topics  . Smoking status: Never Smoker  . Smokeless tobacco: Former Neurosurgeon    Types: Snuff  Quit date: 10/27/1969  . Alcohol use No   Objective:   BP 108/62 (BP Location: Left Arm, Patient Position: Sitting, Cuff Size: Normal)   Temp 97.9 F (36.6 C)   Resp 16   Wt 138 lb (62.6 kg)   LMP  (LMP Unknown)   BMI 26.07 kg/m   Physical Exam  General Appearance:    Alert, cooperative, no distress  Eyes:    PERRL, conjunctiva/corneas clear, EOM's intact       Lungs:     Clear to auscultation bilaterally, respirations unlabored  Heart:    Regular rate and rhythm  Abdomen:   bowel sounds present and normal in all 4 quadrants, soft or round. No CVA tenderness. Mild epigastric tenderness. No masses.    Results for orders placed or performed in visit on 11/03/16  POCT  urinalysis dipstick  Result Value Ref Range   Color, UA clear    Clarity, UA clear    Glucose, UA negative    Bilirubin, UA negative    Ketones, UA neagative    Spec Grav, UA <=1.005    Blood, UA negative    pH, UA 6.0    Protein, UA negative    Urobilinogen, UA 0.2    Nitrite, UA negative    Leukocytes, UA Negative Negative        Assessment & Plan:     1. Nausea  - CBC - Comprehensive metabolic panel - POCT urinalysis dipstick  2. Epigastric pain She states she is taking pantoprazole consistently.  - H Pylori, IGM, IGG, IGA AB - Amylase - POCT urinalysis dipstick       Mila Merryonald Keionna Kinnaird, MD  [facility]

## 2016-11-04 ENCOUNTER — Telehealth: Payer: Self-pay

## 2016-11-04 NOTE — Telephone Encounter (Signed)
-----   Message from Malva Limes, MD sent at 11/04/2016  2:58 PM EST ----- Labs are all normal. She likely has stomach virus and should feel better over the weekend. Call if not feeling better next week.

## 2016-11-04 NOTE — Telephone Encounter (Signed)
Patient advised as below. Patient verbalizes understanding and is in agreement with treatment plan.  

## 2016-11-05 LAB — CBC
Hematocrit: 32.6 % — ABNORMAL LOW (ref 34.0–46.6)
Hemoglobin: 11.2 g/dL (ref 11.1–15.9)
MCH: 29.4 pg (ref 26.6–33.0)
MCHC: 34.4 g/dL (ref 31.5–35.7)
MCV: 86 fL (ref 79–97)
PLATELETS: 126 10*3/uL — AB (ref 150–379)
RBC: 3.81 x10E6/uL (ref 3.77–5.28)
RDW: 14.2 % (ref 12.3–15.4)
WBC: 4.2 10*3/uL (ref 3.4–10.8)

## 2016-11-05 LAB — COMPREHENSIVE METABOLIC PANEL
A/G RATIO: 1.2 (ref 1.2–2.2)
ALT: 9 IU/L (ref 0–32)
AST: 16 IU/L (ref 0–40)
Albumin: 4.1 g/dL (ref 3.5–4.8)
Alkaline Phosphatase: 74 IU/L (ref 39–117)
BUN/Creatinine Ratio: 19 (ref 12–28)
BUN: 15 mg/dL (ref 8–27)
Bilirubin Total: 0.3 mg/dL (ref 0.0–1.2)
CALCIUM: 9.8 mg/dL (ref 8.7–10.3)
CO2: 27 mmol/L (ref 18–29)
Chloride: 98 mmol/L (ref 96–106)
Creatinine, Ser: 0.8 mg/dL (ref 0.57–1.00)
GFR calc Af Amer: 86 mL/min/{1.73_m2} (ref >59)
GFR, EST NON AFRICAN AMERICAN: 75 mL/min/{1.73_m2} (ref >59)
GLOBULIN, TOTAL: 3.3 g/dL (ref 1.5–4.5)
Glucose: 126 mg/dL — ABNORMAL HIGH (ref 65–99)
POTASSIUM: 4 mmol/L (ref 3.5–5.2)
Sodium: 138 mmol/L (ref 134–144)
TOTAL PROTEIN: 7.4 g/dL (ref 6.0–8.5)

## 2016-11-05 LAB — AMYLASE: Amylase: 89 U/L (ref 31–124)

## 2016-11-05 LAB — H PYLORI, IGM, IGG, IGA AB: H Pylori IgG: <0.9 U/mL (ref 0.0–0.8)

## 2016-12-12 ENCOUNTER — Telehealth: Payer: Self-pay

## 2016-12-12 NOTE — Telephone Encounter (Signed)
Pt called c/o right sided abdominal pain, that radiates to mid stomach and back. Pt also c/o nausea and some constipation. Denies dysuria, vomiting, diarrhea, bloody stools. Pt has appointment for 0945 tomorrow. [name], CMA

## 2016-12-13 ENCOUNTER — Ambulatory Visit (INDEPENDENT_AMBULATORY_CARE_PROVIDER_SITE_OTHER): Payer: Medicare PPO | Admitting: Family Medicine

## 2016-12-13 ENCOUNTER — Encounter: Payer: Self-pay | Admitting: Family Medicine

## 2016-12-13 ENCOUNTER — Ambulatory Visit
Admission: RE | Admit: 2016-12-13 | Discharge: 2016-12-13 | Disposition: A | Discharge status: Home / Self Care | Payer: Medicare PPO | Source: Ambulatory Visit | Attending: Family Medicine | Admitting: Family Medicine

## 2016-12-13 VITALS — BP 94/50 | HR 80 | Temp 98.0°F | Resp 16 | Wt 139.0 lb

## 2016-12-13 DIAGNOSIS — R1010 Upper abdominal pain, unspecified: Secondary | ICD-10-CM

## 2016-12-13 DIAGNOSIS — M545 Low back pain, unspecified: Secondary | ICD-10-CM

## 2016-12-13 DIAGNOSIS — R05 Cough: Secondary | ICD-10-CM

## 2016-12-13 DIAGNOSIS — R059 Cough, unspecified: Secondary | ICD-10-CM

## 2016-12-13 DIAGNOSIS — R109 Unspecified abdominal pain: Secondary | ICD-10-CM | POA: Diagnosis present

## 2016-12-13 LAB — POCT URINALYSIS DIPSTICK
Bilirubin, UA: NEGATIVE
Blood, UA: NEGATIVE
Glucose, UA: NEGATIVE
Ketones, UA: NEGATIVE
Leukocytes, UA: NEGATIVE
Nitrite, UA: NEGATIVE
Protein, UA: NEGATIVE
Spec Grav, UA: <=1.005
UROBILINOGEN UA: 0.2
pH, UA: 5

## 2016-12-13 NOTE — Progress Notes (Signed)
Patient: Bethany [name] Female    DOB: 03/07/1946   71 y.o.   MRN: [id number] Visit Date: 12/13/2016  Today's Provider: [name], MD   Chief Complaint  Patient presents with  . Abdominal Pain   Subjective:    Patient stated that her upper abdomin started hurting yesterday. Pain is described as aching and radiates to sides and mid back. Patient stated that when she is lying down there's no pain, however abdomin aches constantly when she sitting up or moving. Symptoms include mild nausea, slight increase in gas, and mild constipation. Patient stated she had a black stool a couple weeks ago. No diarrhea or urinary symptoms. Patient also states she woke up this morning with a scratchy throat and a mild cough.   Abdominal Pain  This is a new problem. The current episode started yesterday. The onset quality is sudden. The problem occurs constantly. The problem has been unchanged. The pain is located in the left flank, right flank, LUQ and RUQ. The pain is at a severity of 5/10. The pain is moderate. The abdominal pain radiates to the left flank, right flank and back. Associated symptoms include belching, constipation, flatus, myalgias and nausea. Pertinent negatives include no anorexia, arthralgias, diarrhea, dysuria, fever, frequency, headaches, hematochezia, hematuria, melena, vomiting or weight loss. The pain is aggravated by movement and deep breathing. The pain is relieved by being still and certain positions. She has tried nothing for the symptoms. Her past medical history is significant for GERD.       Allergies  Allergen Reactions  . Other Other (See Comments)    NO BLOOD PRODUCTS- JEHOVAHS WITNESS  . Sulfa Antibiotics Other (See Comments)    Loss of taste     Current Outpatient Prescriptions:  .  acetaminophen (TYLENOL) 500 MG tablet, Take 1,000 mg by mouth 2 (two) times daily as needed for mild pain or moderate pain., Disp: , Rfl:  .  Alcohol Swabs (B-D SINGLE USE  SWABS REGULAR) PADS, CHECK BLOOD SUGAR DAILY, Disp: 100 each, Rfl: 2 .  ALPRAZolam (XANAX) 0.5 MG tablet, Take 1 tablet (0.5 mg total) by mouth at bedtime as needed for anxiety., Disp: 30 tablet, Rfl: 0 .  aspirin EC 81 MG tablet, Take 81 mg by mouth daily., Disp: , Rfl:  .  carvedilol (COREG) 6.25 MG tablet, Take 1 tablet by mouth 2 (two) times daily., Disp: , Rfl:  .  ferrous sulfate 325 (65 FE) MG tablet, Take 325 mg by mouth 2 (two) times daily with a meal., Disp: , Rfl:  .  furosemide (LASIX) 20 MG tablet, Take 1 tablet (20 mg total) by mouth daily., Disp: 90 tablet, Rfl: 3 .  mirtazapine (REMERON) 7.5 MG tablet, Take 1 tablet (7.5 mg total) by mouth at bedtime., Disp: 90 tablet, Rfl: 3 .  sacubitril-valsartan (ENTRESTO) 24-26 MG, Take 1 tablet by mouth 2 (two) times daily., Disp: 180 tablet, Rfl: 3 .  senna (SENOKOT) 8.6 MG TABS tablet, Take 1 tablet by mouth daily as needed for mild constipation., Disp: , Rfl:  .  SitaGLIPtin-MetFORMIN HCl [phone] MG TB24, Take 1 tablet by mouth daily. Replaces Januvia and metformin, Disp: 90 tablet, Rfl: 3 .  spironolactone (ALDACTONE) 25 MG tablet, Take 1 tablet (25 mg total) by mouth daily., Disp: 90 tablet, Rfl: 3 .  SYNTHROID 50 MCG tablet, TAKE 1 TABLET EVERY DAY, Disp: 90 tablet, Rfl: 3 .  TRUE METRIX BLOOD GLUCOSE TEST test strip, CHECK BLOOD SUGAR  DAILY, Disp: 100 each, Rfl: 1 .  TRUEPLUS LANCETS 33G MISC, CHECK BLOOD SUGAR ONE TIME A DAY  , Disp: 100 each, Rfl: 4 .  pantoprazole (PROTONIX) 40 MG tablet, TAKE 1 TABLET EVERY DAY (Patient not taking: Reported on 12/13/2016), Disp: 90 tablet, Rfl: 3  Review of Systems  Constitutional: Negative for appetite change, chills, fatigue, fever and weight loss.  HENT: Positive for sore throat.   Respiratory: Positive for cough. Negative for chest tightness and shortness of breath.   Cardiovascular: Negative for chest pain and palpitations.  Gastrointestinal: Positive for abdominal pain, constipation, flatus  and nausea. Negative for anorexia, diarrhea, hematochezia, melena and vomiting.  Genitourinary: Negative for dysuria, frequency and hematuria.  Musculoskeletal: Positive for myalgias. Negative for arthralgias.  Neurological: Negative for dizziness, weakness and headaches.    Social History  Substance Use Topics  . Smoking status: Never Smoker  . Smokeless tobacco: Former Neurosurgeon    Types: Snuff    Quit date: 10/27/1969  . Alcohol use No   Objective:   BP (!) 94/50 (BP Location: Right Arm, Patient Position: Sitting, Cuff Size: Normal)   Pulse 80   Temp 98 F (36.7 C) (Oral)   Resp 16   Wt 139 lb (63 kg)   LMP  (LMP Unknown)   SpO2 99%   BMI 26.26 kg/m   Physical Exam  General Appearance:    Alert, cooperative, no distress  Eyes:    PERRL, conjunctiva/corneas clear, EOM's intact       Lungs:     Clear to auscultation bilaterally, respirations unlabored  Heart:    Regular rate and rhythm  Abdomen:   bowel sounds present and normal in all 4 quadrants, soft or round. No CVA tenderness  Mild diffuse upper abdominal tenderness, no masses, no rebound, no guarding.    Results for orders placed or performed in visit on 12/13/16  POCT urinalysis dipstick  Result Value Ref Range   Color, UA Clear    Clarity, UA Clear    Glucose, UA Neg    Bilirubin, UA Neg    Ketones, UA Neg    Spec Grav, UA <=1.005    Blood, UA Neg    pH, UA 5.0    Protein, UA Neg    Urobilinogen, UA 0.2    Nitrite, UA Neg    Leukocytes, UA Negative Negative        Assessment & Plan:     1. Low back pain without sciatica, unspecified back pain laterality, unspecified chronicity  - POCT urinalysis dipstick - DG Abd 2 Views; Future  2. Pain of upper abdomen  - DG Abd 2 Views; Future  3. Cough      The entirety of the information documented in the History of Present Illness, Review of Systems and Physical Exam were personally obtained by me. Portions of this information were initially documented by  April M. Hyacinth Meeker, CMA and reviewed by me for thoroughness and accuracy.    [name], MD  [facility]

## 2016-12-16 ENCOUNTER — Telehealth: Payer: Self-pay | Admitting: Family Medicine

## 2016-12-16 MED ORDER — AZITHROMYCIN 250 MG PO TABS: ORAL_TABLET | ORAL | 0 refills | Status: AC

## 2016-12-16 NOTE — Telephone Encounter (Signed)
Patient did mention yesterday when we called her with her x-ray results that her cough had gotten worse since we saw her in office. Advised pt to call us back if she was not feeling better. Please advise?

## 2016-12-16 NOTE — Telephone Encounter (Signed)
Pt has a cough and would like something called in.  She was last in the office a few days ago,  She uses [facility]  Thanks Barth Kirks

## 2016-12-16 NOTE — Telephone Encounter (Signed)
Pt reports she has had dry cough x 2 days. Some SOB. Denies chest pain/tightness. Also c/o headache and chills. Please advise. [name], CMA

## 2016-12-16 NOTE — Telephone Encounter (Signed)
Have sent in  rx for z pack.

## 2016-12-17 ENCOUNTER — Emergency Department: Payer: Medicare PPO

## 2016-12-17 ENCOUNTER — Encounter: Payer: Self-pay | Admitting: Emergency Medicine

## 2016-12-17 ENCOUNTER — Emergency Department
Admission: EM | Admit: 2016-12-17 | Discharge: 2016-12-17 | Disposition: A | Discharge status: Home / Self Care | Payer: Medicare PPO | Attending: Emergency Medicine | Admitting: Emergency Medicine

## 2016-12-17 DIAGNOSIS — J069 Acute upper respiratory infection, unspecified: Secondary | ICD-10-CM

## 2016-12-17 DIAGNOSIS — E039 Hypothyroidism, unspecified: Secondary | ICD-10-CM | POA: Diagnosis not present

## 2016-12-17 DIAGNOSIS — I110 Hypertensive heart disease with heart failure: Secondary | ICD-10-CM | POA: Insufficient documentation

## 2016-12-17 DIAGNOSIS — I5022 Chronic systolic (congestive) heart failure: Secondary | ICD-10-CM | POA: Insufficient documentation

## 2016-12-17 DIAGNOSIS — Z87891 Personal history of nicotine dependence: Secondary | ICD-10-CM | POA: Insufficient documentation

## 2016-12-17 DIAGNOSIS — Z7982 Long term (current) use of aspirin: Secondary | ICD-10-CM | POA: Insufficient documentation

## 2016-12-17 DIAGNOSIS — E119 Type 2 diabetes mellitus without complications: Secondary | ICD-10-CM | POA: Diagnosis not present

## 2016-12-17 DIAGNOSIS — K5900 Constipation, unspecified: Secondary | ICD-10-CM | POA: Diagnosis not present

## 2016-12-17 DIAGNOSIS — Z79899 Other long term (current) drug therapy: Secondary | ICD-10-CM | POA: Insufficient documentation

## 2016-12-17 DIAGNOSIS — R05 Cough: Secondary | ICD-10-CM | POA: Diagnosis present

## 2016-12-17 LAB — CBC WITH DIFFERENTIAL/PLATELET
BASOS ABS: 0 10*3/uL (ref 0–0.1)
Basophils Relative: 0 %
Eosinophils Absolute: 0 10*3/uL (ref 0–0.7)
Eosinophils Relative: 0 %
HCT: 34.2 % — ABNORMAL LOW (ref 35.0–47.0)
Hemoglobin: 11.5 g/dL — ABNORMAL LOW (ref 12.0–16.0)
LYMPHS ABS: 1.2 10*3/uL (ref 1.0–3.6)
Lymphocytes Relative: 41 %
MCH: 29.5 pg (ref 26.0–34.0)
MCHC: 33.7 g/dL (ref 32.0–36.0)
MCV: 87.6 fL (ref 80.0–100.0)
MONO ABS: 0.4 10*3/uL (ref 0.2–0.9)
Monocytes Relative: 13 %
Neutro Abs: 1.4 10*3/uL (ref 1.4–6.5)
Neutrophils Relative %: 46 %
PLATELETS: 95 10*3/uL — AB (ref 150–440)
RBC: 3.91 MIL/uL (ref 3.80–5.20)
RDW: 13.6 % (ref 11.5–14.5)
WBC: 3 10*3/uL — ABNORMAL LOW (ref 3.6–11.0)

## 2016-12-17 LAB — COMPREHENSIVE METABOLIC PANEL
ALK PHOS: 45 U/L (ref 38–126)
ALT: 15 U/L (ref 14–54)
AST: 30 U/L (ref 15–41)
Albumin: 3.7 g/dL (ref 3.5–5.0)
Anion gap: 10 (ref 5–15)
BILIRUBIN TOTAL: 0.7 mg/dL (ref 0.3–1.2)
BUN: 10 mg/dL (ref 6–20)
CALCIUM: 8.9 mg/dL (ref 8.9–10.3)
CO2: 27 mmol/L (ref 22–32)
CREATININE: 0.64 mg/dL (ref 0.44–1.00)
Chloride: 100 mmol/L — ABNORMAL LOW (ref 101–111)
GFR calc Af Amer: >60 mL/min (ref >60)
Glucose, Bld: 139 mg/dL — ABNORMAL HIGH (ref 65–99)
POTASSIUM: 3.5 mmol/L (ref 3.5–5.1)
Sodium: 137 mmol/L (ref 135–145)
TOTAL PROTEIN: 6.9 g/dL (ref 6.5–8.1)

## 2016-12-17 LAB — PROTIME-INR
INR: 1
Prothrombin Time: 13.2 seconds (ref 11.4–15.2)

## 2016-12-17 MED ORDER — BENZONATATE 100 MG PO CAPS: 100.0000 mg | ORAL_CAPSULE | Freq: Three times a day (TID) | ORAL | 0 refills | Status: DC | PRN

## 2016-12-17 NOTE — ED Triage Notes (Signed)
Pt to ed with c/o cough, congestion, fever, body aches x 1 week.

## 2016-12-17 NOTE — Discharge Instructions (Signed)
Follow-up with your primary care provider next week for recheck of your cough and for him to review your labs, specifically your platelet count.  Return to the emergency department for symptoms that change or worsen if unable to schedule an appointment

## 2016-12-17 NOTE — ED Provider Notes (Signed)
[facility] Emergency Department Provider Note  ____________________________________________   First MD Initiated Contact with Patient 12/17/16 1129     (approximate)  I have reviewed the triage vital signs and the nursing notes.   HISTORY  Chief Complaint Cough   HPI Bethany [name] is a 71 y.o. female who presents to the emergency department for evaluation of cough, congestion, fever, and body aches for the past week. She also states that she has had some "soot colored" stool since Monday. She denies history of GI bleed. She states that Dr. Caryn Section has been evaluating her for abdominal pain and did an x-ray a few days ago. She denies nausea, vomiting, or abdominal pain at this moment.She has not been taking any medications for her URI symptoms.   Past Medical History:  Diagnosis Date  . AICD (automatic cardioverter/defibrillator) present   . Anemia   . Anxiety   . CHF (congestive heart failure) ([place])   . Complication of anesthesia   . Depression   . Diabetes ([place])   . Dilated cardiomyopathy ([place])   . Diverticulosis   . Dyspnea   . Edema    FEET/ANKLES OCCAS  . GERD (gastroesophageal reflux disease)   . Hyperlipidemia   . Hypothyroidism   . LV dysfunction   . Orthopnea    USES 3 PILLOWS  . PONV (postoperative nausea and vomiting)   . Reflux   . Thrombocytopenia ([place])   . Thyroid disease   . VHD (valvular heart disease)     Patient Active Problem List   Diagnosis Date Noted  . Memory loss 07/12/2016  . GERD (gastroesophageal reflux disease) 08/24/2015  . Automatic implantable cardioverter-defibrillator in situ 07/29/2015  . Hypotension 06/18/2015  . Benign essential HTN 03/20/2015  . Absolute anemia 03/11/2015  . Abnormal liver enzymes 03/11/2015  . Decreased potassium in the blood 03/11/2015  . Hypercholesteremia 03/11/2015  . Adult hypothyroidism 03/11/2015  . Idiopathic insomnia 03/11/2015  . OP (osteoporosis) 03/11/2015  .  Diabetes mellitus, type 2 ([place]) 03/11/2015  . LBBB (left bundle branch block) 02/11/2015  . Paroxysmal supraventricular tachycardia ([place]) 11/25/2014  . Chronic systolic heart failure ([place]) 09/25/2014  . Heart valve disease 04/10/2014    Past Surgical History:  Procedure Laterality Date  . ABDOMINAL HYSTERECTOMY    . BI-VENTRICULAR IMPLANTABLE CARDIOVERTER DEFIBRILLATOR [name] 02/11/2015   Procedure: BI-VENTRICULAR IMPLANTABLE CARDIOVERTER DEFIBRILLATOR  (CRT-D);  Surgeon: Deboraha Sprang, MD;  Location: [facility] CATH LAB;  Service: Cardiovascular;  Laterality: [name];  . BONE MARROW BIOPSY  2007   spine  . CARDIAC CATHETERIZATION    . CATARACT EXTRACTION W/PHACO Left 10/27/2016   Procedure: CATARACT EXTRACTION PHACO AND INTRAOCULAR LENS PLACEMENT (IOC);  Surgeon: Leandrew Koyanagi, MD;  Location: [facility] ORS;  Service: Ophthalmology;  Laterality: Left;  [place]  01:12 AP%  21.0 CDE  11.59 Fluid pack lot # [id number]  . CHOLECYSTECTOMY    . INSERT / REPLACE / REMOVE PACEMAKER     AICD  . LEAD REVISION [name] 02/11/2015   Procedure: LEAD REVISION;  Surgeon: Deboraha Sprang, MD;  Location: [facility] CATH LAB;  Service: Cardiovascular;  Laterality: [name];  . PARTIAL HYSTERECTOMY  1977  . TOOTH EXTRACTION  09/23/2015    Prior to Admission medications   Medication Sig Start Date End Date Taking? Authorizing Provider  acetaminophen (TYLENOL) 500 MG tablet Take 1,000 mg by mouth 2 (two) times daily as needed for mild pain or moderate pain.    Historical Provider, MD  Alcohol Swabs (  B-D SINGLE USE SWABS REGULAR) PADS CHECK BLOOD SUGAR DAILY 07/25/16   Birdie Sons, MD  ALPRAZolam Duanne Moron) 0.5 MG tablet Take 1 tablet (0.5 mg total) by mouth at bedtime as needed for anxiety. 05/13/15   Margarita Rana, MD  aspirin EC 81 MG tablet Take 81 mg by mouth daily.    Historical Provider, MD  azithromycin (ZITHROMAX) 250 MG tablet 2 by mouth today, then 1 daily for 4 days 12/16/16 12/21/16  Birdie Sons, MD  benzonatate (TESSALON PERLES)  100 MG capsule Take 1 capsule (100 mg total) by mouth 3 (three) times daily as needed for cough. 12/17/16 12/17/17  Victorino Dike, FNP  carvedilol (COREG) 6.25 MG tablet Take 1 tablet by mouth 2 (two) times daily. 03/26/15   Historical Provider, MD  ferrous sulfate 325 (65 FE) MG tablet Take 325 mg by mouth 2 (two) times daily with a meal.    Historical Provider, MD  furosemide (LASIX) 20 MG tablet Take 1 tablet (20 mg total) by mouth daily. 12/17/15   Alisa Graff, FNP  mirtazapine (REMERON) 7.5 MG tablet Take 1 tablet (7.5 mg total) by mouth at bedtime. 07/07/16   Birdie Sons, MD  pantoprazole (PROTONIX) 40 MG tablet TAKE 1 TABLET EVERY DAY Patient not taking: Reported on 12/13/2016 06/10/16   Birdie Sons, MD  sacubitril-valsartan (ENTRESTO) 24-26 MG Take 1 tablet by mouth 2 (two) times daily. 05/09/16   Alisa Graff, FNP  senna (SENOKOT) 8.6 MG TABS tablet Take 1 tablet by mouth daily as needed for mild constipation.    Historical Provider, MD  SitaGLIPtin-MetFORMIN HCl [phone] MG TB24 Take 1 tablet by mouth daily. Replaces Januvia and metformin 07/07/16   Birdie Sons, MD  spironolactone (ALDACTONE) 25 MG tablet Take 1 tablet (25 mg total) by mouth daily. 12/17/15   Alisa Graff, FNP  SYNTHROID 50 MCG tablet TAKE 1 TABLET EVERY DAY 06/15/16   Birdie Sons, MD  TRUE METRIX BLOOD GLUCOSE TEST test strip CHECK BLOOD SUGAR DAILY 09/20/16   Birdie Sons, MD  TRUEPLUS LANCETS 33G MISC CHECK BLOOD SUGAR ONE TIME A DAY   07/20/16   Birdie Sons, MD    Allergies Other and Sulfa antibiotics  Family History  Problem Relation Age of Onset  . Heart disease Mother   . Heart attack Mother   . Prostate cancer Father   . Ulcers Father   . Stroke Sister   . Heart attack Sister   . Diabetes Sister   . Heart attack Brother   . Stroke Brother     Social History Social History  Substance Use Topics  . Smoking status: Never Smoker  . Smokeless tobacco: Former Systems developer    Types: Snuff     Quit date: 10/27/1969  . Alcohol use No    Review of Systems Constitutional: No fever/chills ENT: No sore throat. Cardiovascular: Denies chest pain. Respiratory: Denies shortness of breath. Positive for cough. Gastrointestinal: No abdominal pain at present.  No nausea, no vomiting.  No diarrhea.  Positive for constipation. Musculoskeletal: Negative for back pain. Skin: Negative for rash. Neurological: Negative for headaches, focal weakness or numbness.  ____________________________________________   PHYSICAL EXAM:  VITAL SIGNS: ED Triage Vitals [12/17/16 0959]  Enc Vitals Group     BP (!) 109/53     Pulse Rate 98     Resp 20     Temp 98.2 F (36.8 C)     Temp Source Oral  SpO2 97 %     Weight 139 lb (63 kg)     Height      Head Circumference      Peak Flow      Pain Score 7     Pain Loc      Pain Edu?      Excl. in [place]?     Constitutional: Alert and oriented. Well appearing and in no acute distress. Eyes: Conjunctivae are normal. EOMI. Head: Atraumatic. Nose: No congestion/rhinnorhea. Mouth/Throat: Mucous membranes are moist.  Oropharynx non-erythematous. Neck: No stridor.   Cardiovascular: Normal rate, regular rhythm. Grossly normal heart sounds.  Good peripheral circulation. Respiratory: Normal respiratory effort.  No retractions. Lungs CTAB. Gastrointestinal: Soft and nontender. No distention. No abdominal bruits. No CVA tenderness.No fissures or external hemorrhoids noted on exam. Musculoskeletal: No lower extremity tenderness nor edema.  No joint effusions. Neurologic:  Normal speech and language. No gross focal neurologic deficits are appreciated. No gait instability. Skin:  Skin is warm, dry and intact. No rash noted. Psychiatric: Mood and affect are normal. Speech and behavior are normal.  ____________________________________________   LABS (all labs ordered are listed, but only abnormal results are displayed)  Labs Reviewed  CBC WITH  DIFFERENTIAL/PLATELET - Abnormal; Notable for the following:       Result Value   WBC 3.0 (*)    Hemoglobin 11.5 (*)    HCT 34.2 (*)    Platelets 95 (*)    All other components within normal limits  COMPREHENSIVE METABOLIC PANEL - Abnormal; Notable for the following:    Chloride 100 (*)    Glucose, Bld 139 (*)    All other components within normal limits  PROTIME-INR  POC OCCULT BLOOD, ED   Hemoccult negative ____________________________________________  EKG   ____________________________________________  RADIOLOGY   ____________________________________________   PROCEDURES  Procedure(s) performed: None  Procedures  Critical Care performed: No  ____________________________________________   INITIAL IMPRESSION / ASSESSMENT AND PLAN / ED COURSE  Pertinent labs & imaging results that were available during my care of the patient were reviewed by me and considered in my medical decision making (see chart for details).  71 year old female presenting to the emergency department for evaluation of URI symptoms and dark stools. Vital signs, chest x-ray, and assessment are all reassuring and without concern for pneumonia. Hemoccult is negative. Patient will be given a prescription for Tessalon Perles and instructed to follow-up with Dr. Caryn Section for further evaluation of the URI symptoms as well as any further abdominal pain or dark stool episodes. She was instructed to return to the emergency department immediately for any shortness of breath or increase in abdominal pain or fever if she is unable to see her primary care provider.      ____________________________________________   FINAL CLINICAL IMPRESSION(S) / ED DIAGNOSES  Final diagnoses:  Acute upper respiratory infection      NEW MEDICATIONS STARTED DURING THIS VISIT:  Discharge Medication List as of 12/17/2016  2:00 PM    START taking these medications   Details  benzonatate (TESSALON PERLES) 100 MG capsule  Take 1 capsule (100 mg total) by mouth 3 (three) times daily as needed for cough., Starting Sat 12/17/2016, Until Sun 12/17/2017, Print         Note:  This document was prepared using Dragon voice recognition software and may include unintentional dictation errors.    Victorino Dike, FNP 12/17/16 1606    Harvest Dark, MD 12/18/16 [phone]

## 2016-12-20 ENCOUNTER — Ambulatory Visit (INDEPENDENT_AMBULATORY_CARE_PROVIDER_SITE_OTHER): Payer: Medicare PPO | Admitting: Family Medicine

## 2016-12-20 ENCOUNTER — Encounter: Payer: Self-pay | Admitting: Family Medicine

## 2016-12-20 ENCOUNTER — Telehealth: Payer: Self-pay | Admitting: Family Medicine

## 2016-12-20 ENCOUNTER — Ambulatory Visit (INDEPENDENT_AMBULATORY_CARE_PROVIDER_SITE_OTHER): Payer: Medicare PPO | Admitting: *Deleted

## 2016-12-20 VITALS — BP 88/48 | HR 75 | Temp 97.7°F | Resp 16 | Ht 61.0 in | Wt 136.0 lb

## 2016-12-20 DIAGNOSIS — I5022 Chronic systolic (congestive) heart failure: Secondary | ICD-10-CM

## 2016-12-20 DIAGNOSIS — D649 Anemia, unspecified: Secondary | ICD-10-CM

## 2016-12-20 DIAGNOSIS — I428 Other cardiomyopathies: Secondary | ICD-10-CM | POA: Diagnosis not present

## 2016-12-20 DIAGNOSIS — E119 Type 2 diabetes mellitus without complications: Secondary | ICD-10-CM

## 2016-12-20 DIAGNOSIS — J069 Acute upper respiratory infection, unspecified: Secondary | ICD-10-CM | POA: Diagnosis not present

## 2016-12-20 LAB — POCT GLYCOSYLATED HEMOGLOBIN (HGB A1C)
ESTIMATED AVERAGE GLUCOSE: 174
Hemoglobin A1C: 7.7

## 2016-12-20 MED ORDER — FUROSEMIDE 20 MG PO TABS: 20.0000 mg | ORAL_TABLET | ORAL | 0 refills | Status: DC

## 2016-12-20 NOTE — Progress Notes (Signed)
Patient: Bethany [name] Female    DOB: 11/08/1945   71 y.o.   MRN: [id number] Visit Date: 12/20/2016  Today's Provider: [name], MD   Chief Complaint  Patient presents with  . Follow-up  . Diabetes  . Hypertension   Subjective:    HPI  Follow up URI Was seen in ER on 2/10 2018 for URI. Was prescribed tessalon and azithromycin. Still has some chest congestion and cough, but is improving.  No fever, sore throat or body aches. No change in breathing from her baseline. Had following labs done.   Lab Results  Component Value Date   NA 137 12/17/2016   K 3.5 12/17/2016   CL 100 (L) 12/17/2016   CO2 27 12/17/2016   GLUCOSE 139 (H) 12/17/2016   BUN 10 12/17/2016   CREATININE 0.64 12/17/2016   CALCIUM 8.9 12/17/2016   GFRNONAA >60 12/17/2016   GFRAA >60 12/17/2016   Lab Results  Component Value Date   WBC 3.0 (L) 12/17/2016   HGB 11.5 (L) 12/17/2016   HCT 34.2 (L) 12/17/2016   MCV 87.6 12/17/2016   PLT 95 (L) 12/17/2016      Chronic systolic heart failure (HCC) From 09/08/2016-no changes. Is doing well on Entresto. No swelling. States she has follow up at CHF clinic in March.    Diabetes Mellitus Type II, Follow-up:   Lab Results  Component Value Date   HGBA1C 7.7 12/20/2016   HGBA1C 7.6 09/08/2016   HGBA1C 7.0 07/07/2016   Last seen for diabetes 3 months ago.  Management since then includes; no changes. She reports good compliance with treatment. She is not having side effects. none Current symptoms include none and have been unchanged. Home blood sugar records: fasting range: 116-215  Episodes of hypoglycemia? no   Current Insulin Regimen: n/a Most Recent Eye Exam: 09/05/2016 Weight trend: stable Prior visit with dietician: no Current diet: well balanced Current exercise: walking  ----------------------------------------------------------------   Hypertension, follow-up:  BP Readings from Last 3 Encounters:  12/20/16 (!) 88/48    12/17/16 110/61  12/13/16 (!) 94/50    She was last seen for hypertension 3 months ago.  BP at that visit was 92/60. Management since that visit includes; no changes.She reports good compliance with treatment. She is not having side effects. none She is not exercising. She is adherent to low salt diet.   Outside blood pressures are 100/70. She is experiencing none.  Patient denies none.   Cardiovascular risk factors include diabetes.  Use of agents associated with hypertension: none.   ----------------------------------------------------------------  Allergies  Allergen Reactions  . Other Other (See Comments)    NO BLOOD PRODUCTS- JEHOVAHS WITNESS  . Sulfa Antibiotics Other (See Comments)    Loss of taste     Current Outpatient Prescriptions:  .  acetaminophen (TYLENOL) 500 MG tablet, Take 1,000 mg by mouth 2 (two) times daily as needed for mild pain or moderate pain., Disp: , Rfl:  .  Alcohol Swabs (B-D SINGLE USE SWABS REGULAR) PADS, CHECK BLOOD SUGAR DAILY, Disp: 100 each, Rfl: 2 .  ALPRAZolam (XANAX) 0.5 MG tablet, Take 1 tablet (0.5 mg total) by mouth at bedtime as needed for anxiety., Disp: 30 tablet, Rfl: 0 .  aspirin EC 81 MG tablet, Take 81 mg by mouth daily., Disp: , Rfl:  .  azithromycin (ZITHROMAX) 250 MG tablet, 2 by mouth today, then 1 daily for 4 days, Disp: 6 tablet, Rfl: 0 .  benzonatate (TESSALON  PERLES) 100 MG capsule, Take 1 capsule (100 mg total) by mouth 3 (three) times daily as needed for cough., Disp: 30 capsule, Rfl: 0 .  carvedilol (COREG) 6.25 MG tablet, Take 1 tablet by mouth 2 (two) times daily., Disp: , Rfl:  .  ferrous sulfate 325 (65 FE) MG tablet, Take 325 mg by mouth 2 (two) times daily with a meal., Disp: , Rfl:  .  furosemide (LASIX) 20 MG tablet, Take 1 tablet (20 mg total) by mouth daily., Disp: 90 tablet, Rfl: 3 .  mirtazapine (REMERON) 7.5 MG tablet, Take 1 tablet (7.5 mg total) by mouth at bedtime., Disp: 90 tablet, Rfl: 3 .   pantoprazole (PROTONIX) 40 MG tablet, TAKE 1 TABLET EVERY DAY, Disp: 90 tablet, Rfl: 3 .  sacubitril-valsartan (ENTRESTO) 24-26 MG, Take 1 tablet by mouth 2 (two) times daily., Disp: 180 tablet, Rfl: 3 .  senna (SENOKOT) 8.6 MG TABS tablet, Take 1 tablet by mouth daily as needed for mild constipation., Disp: , Rfl:  .  SitaGLIPtin-MetFORMIN HCl [id number] MG TB24, Take 1 tablet by mouth daily. Replaces Januvia and metformin, Disp: 90 tablet, Rfl: 3 .  spironolactone (ALDACTONE) 25 MG tablet, Take 1 tablet (25 mg total) by mouth daily., Disp: 90 tablet, Rfl: 3 .  SYNTHROID 50 MCG tablet, TAKE 1 TABLET EVERY DAY, Disp: 90 tablet, Rfl: 3 .  TRUE METRIX BLOOD GLUCOSE TEST test strip, CHECK BLOOD SUGAR DAILY, Disp: 100 each, Rfl: 1 .  TRUEPLUS LANCETS 33G MISC, CHECK BLOOD SUGAR ONE TIME A DAY  , Disp: 100 each, Rfl: 4  Review of Systems  Constitutional: Positive for fatigue. Negative for appetite change, chills and fever.  Respiratory: Negative for chest tightness and shortness of breath.   Cardiovascular: Negative for chest pain and palpitations.  Gastrointestinal: Negative for abdominal pain, nausea and vomiting.  Neurological: Negative for dizziness and weakness.    Social History  Substance Use Topics  . Smoking status: Never Smoker  . Smokeless tobacco: Former Neurosurgeon    Types: Snuff    Quit date: 10/27/1969  . Alcohol use No   Objective:   BP (!) 88/48 (BP Location: Left Arm, Patient Position: Sitting, Cuff Size: Normal)   Pulse 75   Temp 97.7 F (36.5 C) (Oral)   Resp 16   Ht 5\' 1"  (1.549 m)   Wt 136 lb (61.7 kg)   LMP  (LMP Unknown)   SpO2 93%   BMI 25.70 kg/m   Physical Exam  General Appearance:    Alert, cooperative, no distress  HENT:   bilateral TM normal without fluid or infection, neck without nodes, pharynx erythematous without exudate and nasal mucosa pale and congested  Eyes:    PERRL, conjunctiva/corneas clear, EOM's intact       Lungs:     Clear to auscultation  bilaterally, respirations unlabored  Heart:    Regular rate and rhythm. No edema.   Neurologic:   Awake, alert, oriented x 3. No apparent focal neurological           defect.       Results for orders placed or performed in visit on 12/20/16  POCT glycosylated hemoglobin (Hb A1C)  Result Value Ref Range   Hemoglobin A1C 7.7    Est. average glucose Bld gHb Est-mCnc 174         Assessment & Plan:     1. Type 2 diabetes mellitus without complication, without long-term current use of insulin (HCC) Well controlled. Continue current medications.    -  POCT glycosylated hemoglobin (Hb A1C)  2. Upper respiratory tract infection, unspecified type Counseled regarding signs and symptoms of viral and bacterial respiratory infections. Advised to call or return for additional evaluation if she develops any sign of bacterial infection, or if current symptoms last longer than 10 days.    3. Anemia, unspecified type Stable per labs done on 12-17-16 at [facility].   4. Chronic systolic heart failure (HCC) Doing well on Entresto. BP is a little low, potassium borderline low at ER on 12-17-16.. Will decrease furosemide to 20mg  QOD for the time being. Check electrolytes at follow up. Follow up CHF clinic and Dr. Gwen Pounds as scheduled.   Return in about 3 months (around 03/19/2017).        [name], MD  [facility]

## 2016-12-20 NOTE — Progress Notes (Signed)
Remote ICD transmission.   

## 2016-12-21 ENCOUNTER — Encounter: Payer: Self-pay | Admitting: Cardiology

## 2016-12-23 ENCOUNTER — Telehealth: Payer: Self-pay | Admitting: Family Medicine

## 2016-12-23 LAB — CUP PACEART REMOTE DEVICE CHECK
Battery Remaining Longevity: 51 mo
Battery Voltage: 2.97 V
Brady Statistic AP VS Percent: 0.01 %
Brady Statistic AS VP Percent: 98.56 %
Brady Statistic RV Percent Paced: 6.32 %
HighPow Impedance: 53 Ohm
Implantable Lead Implant Date: 20160409
Implantable Lead Implant Date: 20160409
Implantable Lead Location: 753858
Implantable Lead Location: 753859
Implantable Lead Location: 753860
Implantable Lead Model: 5076
Implantable Pulse Generator Implant Date: 20160409
Lead Channel Impedance Value: 1064 Ohm
Lead Channel Impedance Value: 342 Ohm
Lead Channel Impedance Value: 399 Ohm
Lead Channel Impedance Value: 418 Ohm
Lead Channel Impedance Value: 475 Ohm
Lead Channel Impedance Value: 513 Ohm
Lead Channel Impedance Value: 551 Ohm
Lead Channel Impedance Value: 722 Ohm
Lead Channel Impedance Value: 817 Ohm
Lead Channel Impedance Value: 988 Ohm
Lead Channel Pacing Threshold Amplitude: 0.75 V
Lead Channel Pacing Threshold Amplitude: 2 V
Lead Channel Pacing Threshold Pulse Width: 0.4 ms
Lead Channel Pacing Threshold Pulse Width: 0.8 ms
Lead Channel Sensing Intrinsic Amplitude: 1.25 mV
Lead Channel Sensing Intrinsic Amplitude: 1.25 mV
Lead Channel Sensing Intrinsic Amplitude: 10.5 mV
Lead Channel Sensing Intrinsic Amplitude: 10.5 mV
Lead Channel Setting Pacing Amplitude: 2 V
Lead Channel Setting Pacing Amplitude: 3 V
Lead Channel Setting Pacing Pulse Width: 0.4 ms
Lead Channel Setting Pacing Pulse Width: 0.8 ms
MDC IDC LEAD IMPLANT DT: 20160409
MDC IDC MSMT LEADCHNL LV IMPEDANCE VALUE: 1064 Ohm
MDC IDC MSMT LEADCHNL LV IMPEDANCE VALUE: 893 Ohm
MDC IDC MSMT LEADCHNL LV IMPEDANCE VALUE: 950 Ohm
MDC IDC MSMT LEADCHNL RA PACING THRESHOLD AMPLITUDE: 0.375 V
MDC IDC MSMT LEADCHNL RA PACING THRESHOLD PULSEWIDTH: 0.4 ms
MDC IDC SESS DTM: 20180213083323
MDC IDC SET LEADCHNL RA PACING AMPLITUDE: 1.5 V
MDC IDC SET LEADCHNL RV SENSING SENSITIVITY: 0.3 mV
MDC IDC STAT BRADY AP VP PERCENT: 0.04 %
MDC IDC STAT BRADY AS VS PERCENT: 1.39 %
MDC IDC STAT BRADY RA PERCENT PACED: 0.05 %

## 2016-12-23 NOTE — Telephone Encounter (Signed)
Pt called saying she is still having headache , chills, no appetite.  She has been feeling bad for a couple ekks now.  Her call is [phone]  Thanks Barth Kirks

## 2016-12-23 NOTE — Telephone Encounter (Signed)
Pt called back stated to disregard the previous message because she is feeling better. Thanks TNP

## 2016-12-23 NOTE — Telephone Encounter (Signed)
Please advise 

## 2016-12-26 ENCOUNTER — Encounter: Payer: Self-pay | Admitting: *Deleted

## 2016-12-26 ENCOUNTER — Ambulatory Visit: Payer: Medicare PPO | Admitting: Podiatry

## 2016-12-28 ENCOUNTER — Ambulatory Visit (INDEPENDENT_AMBULATORY_CARE_PROVIDER_SITE_OTHER): Payer: Medicare PPO

## 2016-12-28 ENCOUNTER — Ambulatory Visit (INDEPENDENT_AMBULATORY_CARE_PROVIDER_SITE_OTHER): Payer: Medicare PPO | Admitting: Podiatry

## 2016-12-28 VITALS — BP 94/54 | HR 77 | Resp 16

## 2016-12-28 DIAGNOSIS — M79609 Pain in unspecified limb: Secondary | ICD-10-CM

## 2016-12-28 DIAGNOSIS — E119 Type 2 diabetes mellitus without complications: Secondary | ICD-10-CM

## 2016-12-28 DIAGNOSIS — B351 Tinea unguium: Secondary | ICD-10-CM | POA: Diagnosis not present

## 2016-12-28 NOTE — Progress Notes (Signed)
   Subjective:    Patient ID: Bethany [name], female    DOB: Jul 24, 1946, 71 y.o.   MRN: [id number]  HPI: She presents today with a chief complaint of painful nails possibly ingrown bilaterally. She states that she has diabetes and her diabetes is running well but her feet just hurt around the toes.    Review of Systems  All other systems reviewed and are negative.      Objective:   Physical Exam: Vital signs are stable she is alert and oriented 3. Pulses are palpable. Neurologic sensorium is intact per Semmes-Weinstein monofilament. Deep tendon reflexes are intact. Muscle strength is normal bilateral. Orthopedic evaluation was all joints distal to the ankle for range of motion without crepitation. Cutaneous evaluation demonstrate no other lesions or wounds that she does have a significant sharp incurvated nail margins to the tibiofibular border the hallux bilateral no signs of infection. Nails are thick yellow dystrophic with mycotic and painful palpation as well as debridement.        Assessment & Plan:  Diabetes mellitus without complications. Pain in limb secondary to onychomycosis.  Plan: Debridement of toenails 1 through 5 bilateral.

## 2017-01-03 ENCOUNTER — Encounter: Payer: Self-pay | Admitting: *Deleted

## 2017-01-03 ENCOUNTER — Ambulatory Visit
Admission: RE | Admit: 2017-01-03 | Discharge: 2017-01-03 | Disposition: A | Discharge status: Home / Self Care | Payer: Medicare PPO | Source: Ambulatory Visit | Attending: Ophthalmology | Admitting: Ophthalmology

## 2017-01-03 ENCOUNTER — Ambulatory Visit: Payer: Medicare PPO | Admitting: Anesthesiology

## 2017-01-03 ENCOUNTER — Encounter
Admission: RE | Disposition: A | Discharge status: Home / Self Care | Payer: Self-pay | Source: Ambulatory Visit | Attending: Ophthalmology

## 2017-01-03 DIAGNOSIS — Z79899 Other long term (current) drug therapy: Secondary | ICD-10-CM | POA: Insufficient documentation

## 2017-01-03 DIAGNOSIS — D649 Anemia, unspecified: Secondary | ICD-10-CM | POA: Insufficient documentation

## 2017-01-03 DIAGNOSIS — K219 Gastro-esophageal reflux disease without esophagitis: Secondary | ICD-10-CM | POA: Insufficient documentation

## 2017-01-03 DIAGNOSIS — R0601 Orthopnea: Secondary | ICD-10-CM | POA: Insufficient documentation

## 2017-01-03 DIAGNOSIS — F419 Anxiety disorder, unspecified: Secondary | ICD-10-CM | POA: Diagnosis not present

## 2017-01-03 DIAGNOSIS — E1136 Type 2 diabetes mellitus with diabetic cataract: Secondary | ICD-10-CM | POA: Insufficient documentation

## 2017-01-03 DIAGNOSIS — F329 Major depressive disorder, single episode, unspecified: Secondary | ICD-10-CM | POA: Insufficient documentation

## 2017-01-03 DIAGNOSIS — Z7984 Long term (current) use of oral hypoglycemic drugs: Secondary | ICD-10-CM | POA: Insufficient documentation

## 2017-01-03 DIAGNOSIS — I110 Hypertensive heart disease with heart failure: Secondary | ICD-10-CM | POA: Insufficient documentation

## 2017-01-03 DIAGNOSIS — I509 Heart failure, unspecified: Secondary | ICD-10-CM | POA: Insufficient documentation

## 2017-01-03 HISTORY — DX: Cardiac arrhythmia, unspecified: I49.9

## 2017-01-03 HISTORY — PX: CATARACT EXTRACTION W/PHACO: SHX586

## 2017-01-03 LAB — GLUCOSE, CAPILLARY: Glucose-Capillary: 155 mg/dL — ABNORMAL HIGH (ref 65–99)

## 2017-01-03 SURGERY — PHACOEMULSIFICATION, CATARACT, WITH IOL INSERTION
Anesthesia: Monitor Anesthesia Care | Site: Eye | Laterality: Right | Wound class: Clean

## 2017-01-03 MED ORDER — NEOMYCIN-POLYMYXIN-DEXAMETH 0.1 % OP OINT
TOPICAL_OINTMENT | OPHTHALMIC | Status: DC | PRN
  Administered 2017-01-03: 1 via OPHTHALMIC

## 2017-01-03 MED ORDER — MOXIFLOXACIN HCL 0.5 % OP SOLN
OPHTHALMIC | Status: DC | PRN
  Administered 2017-01-03: .2 mL via OPHTHALMIC

## 2017-01-03 MED ORDER — NA HYALUR & NA CHOND-NA HYALUR 0.4-0.35 ML IO KIT
PACK | INTRAOCULAR | Status: DC | PRN
  Administered 2017-01-03: 1 mL via INTRAOCULAR

## 2017-01-03 MED ORDER — NA HYALUR & NA CHOND-NA HYALUR 0.55-0.5 ML IO KIT
PACK | INTRAOCULAR | Status: AC
  Filled 2017-01-03: qty 1.05

## 2017-01-03 MED ORDER — CARBACHOL 0.01 % IO SOLN
INTRAOCULAR | Status: DC | PRN
  Administered 2017-01-03: .5 mL via INTRAOCULAR

## 2017-01-03 MED ORDER — FENTANYL CITRATE (PF) 100 MCG/2ML IJ SOLN
INTRAMUSCULAR | Status: DC | PRN
  Administered 2017-01-03: 50 ug via INTRAVENOUS

## 2017-01-03 MED ORDER — ONDANSETRON HCL 4 MG/2ML IJ SOLN: 4.0000 mg | Freq: Once | INTRAMUSCULAR | Status: DC | PRN

## 2017-01-03 MED ORDER — POVIDONE-IODINE 5 % OP SOLN
OPHTHALMIC | Status: DC | PRN
  Administered 2017-01-03: 1 via OPHTHALMIC

## 2017-01-03 MED ORDER — ONDANSETRON HCL 4 MG/2ML IJ SOLN
INTRAMUSCULAR | Status: AC
  Filled 2017-01-03: qty 2

## 2017-01-03 MED ORDER — FENTANYL CITRATE (PF) 100 MCG/2ML IJ SOLN
INTRAMUSCULAR | Status: AC
  Filled 2017-01-03: qty 2

## 2017-01-03 MED ORDER — LIDOCAINE HCL (PF) 4 % IJ SOLN
INTRAMUSCULAR | Status: DC | PRN
  Administered 2017-01-03: 2.25 mL via OPHTHALMIC

## 2017-01-03 MED ORDER — EPINEPHRINE PF 1 MG/ML IJ SOLN
INTRAMUSCULAR | Status: AC
  Filled 2017-01-03: qty 2

## 2017-01-03 MED ORDER — NEOMYCIN-POLYMYXIN-DEXAMETH 3.5-10000-0.1 OP OINT
TOPICAL_OINTMENT | OPHTHALMIC | Status: AC
  Filled 2017-01-03: qty 3.5

## 2017-01-03 MED ORDER — FENTANYL CITRATE (PF) 100 MCG/2ML IJ SOLN: 25.0000 ug | INTRAMUSCULAR | Status: DC | PRN

## 2017-01-03 MED ORDER — CEFUROXIME OPHTHALMIC INJECTION 1 MG/0.1 ML
INJECTION | OPHTHALMIC | Status: AC
  Filled 2017-01-03: qty 0.1

## 2017-01-03 MED ORDER — ARMC OPHTHALMIC DILATING DROPS
1.0000 "application " | OPHTHALMIC | Status: AC
  Administered 2017-01-03 (×3): 1 via OPHTHALMIC

## 2017-01-03 MED ORDER — ARMC OPHTHALMIC DILATING DROPS
OPHTHALMIC | Status: AC
  Administered 2017-01-03: 1 via OPHTHALMIC
  Filled 2017-01-03: qty 0.4

## 2017-01-03 MED ORDER — ONDANSETRON HCL 4 MG/2ML IJ SOLN
INTRAMUSCULAR | Status: DC | PRN
  Administered 2017-01-03: 4 mg via INTRAVENOUS

## 2017-01-03 MED ORDER — SODIUM CHLORIDE 0.9 % IV SOLN
INTRAVENOUS | Status: DC
  Administered 2017-01-03 (×2): via INTRAVENOUS

## 2017-01-03 MED ORDER — MOXIFLOXACIN HCL 0.5 % OP SOLN
1.0000 [drp] | OPHTHALMIC | Status: AC
  Administered 2017-01-03 (×3): 1 [drp] via OPHTHALMIC

## 2017-01-03 MED ORDER — MOXIFLOXACIN HCL 0.5 % OP SOLN
OPHTHALMIC | Status: AC
  Filled 2017-01-03: qty 3

## 2017-01-03 MED ORDER — EPINEPHRINE PF 1 MG/ML IJ SOLN
INTRAOCULAR | Status: DC | PRN
  Administered 2017-01-03: 1 mL via OPHTHALMIC

## 2017-01-03 SURGICAL SUPPLY — 21 items
CANNULA ANT/CHMB 27GA (MISCELLANEOUS) ×2 IMPLANT
CUP MEDICINE 2OZ PLAST GRAD ST (MISCELLANEOUS) ×2 IMPLANT
GLOVE BIO SURGEON STRL SZ8 (GLOVE) ×2 IMPLANT
GLOVE BIOGEL M 6.5 STRL (GLOVE) ×2 IMPLANT
GLOVE SURG LX 7.5 STRW (GLOVE) ×1
GLOVE SURG LX STRL 7.5 STRW (GLOVE) ×1 IMPLANT
GOWN STRL REUS W/ TWL LRG LVL3 (GOWN DISPOSABLE) ×2 IMPLANT
GOWN STRL REUS W/TWL LRG LVL3 (GOWN DISPOSABLE) ×2
LENS IOL TECNIS ITEC 24.0 (Intraocular Lens) ×2 IMPLANT
PACK CATARACT (MISCELLANEOUS) ×2 IMPLANT
PACK CATARACT BRASINGTON LX (MISCELLANEOUS) ×2 IMPLANT
PACK EYE AFTER SURG (MISCELLANEOUS) ×2 IMPLANT
SOL BSS BAG (MISCELLANEOUS) ×2
SOL PREP PVP 2OZ (MISCELLANEOUS) ×2
SOLUTION BSS BAG (MISCELLANEOUS) ×1 IMPLANT
SOLUTION PREP PVP 2OZ (MISCELLANEOUS) ×1 IMPLANT
SYR 3ML LL SCALE MARK (SYRINGE) ×2 IMPLANT
SYR 5ML LL (SYRINGE) ×2 IMPLANT
SYR TB 1ML 27GX1/2 LL (SYRINGE) ×2 IMPLANT
WATER STERILE IRR 250ML POUR (IV SOLUTION) ×2 IMPLANT
WIPE NON LINTING 3.25X3.25 (MISCELLANEOUS) ×2 IMPLANT

## 2017-01-03 NOTE — Transfer of Care (Signed)
Immediate Anesthesia Transfer of Care Note  Patient: Bethany [name]  Procedure(s) Performed: Procedure(s) with comments: CATARACT EXTRACTION PHACO AND INTRAOCULAR LENS PLACEMENT ([place]) (Right) - [place] 00:50 AP% 19.5 CDE 9.94 fluid pack lot # [id number]  Patient Location: PACU  Anesthesia Type:MAC  Level of Consciousness: awake  Airway & Oxygen Therapy: Patient Spontanous Breathing  Post-op Assessment: Report given to RN  Post vital signs: Reviewed and stable  Last Vitals:  Vitals:   01/03/17 0922  BP: 122/72  Pulse: 71  Resp: 12  Temp: 36.6 C    Last Pain:  Vitals:   01/03/17 0922  TempSrc: Oral         Complications: No apparent anesthesia complications

## 2017-01-03 NOTE — Anesthesia Procedure Notes (Signed)
Procedure Name: MAC Date/Time: 01/03/2017 10:30 AM Performed by: Allean Found Pre-anesthesia Checklist: Patient identified, Emergency Drugs available, Suction available, Patient being monitored and Timeout performed Oxygen Delivery Method: Nasal cannula Placement Confirmation: positive ETCO2 Dental Injury: Teeth and Oropharynx as per pre-operative assessment

## 2017-01-03 NOTE — Discharge Instructions (Signed)
Eye Surgery Discharge Instructions  Expect mild scratchy sensation or mild soreness. DO NOT RUB YOUR EYE!  The day of surgery:  Minimal physical activity, but bed rest is not required  No reading, computer work, or close hand work  No bending, lifting, or straining.  May watch TV  For 24 hours:  No driving, legal decisions, or alcoholic beverages  Safety precautions  Eat anything you prefer: It is better to start with liquids, then soup then solid foods.  _____ Eye patch should be worn until postoperative exam tomorrow.  ____ Solar shield eyeglasses should be worn for comfort in the sunlight/patch while sleeping  Resume all regular medications including aspirin or Coumadin if these were discontinued prior to surgery. You may shower, bathe, shave, or wash your hair. Tylenol may be taken for mild discomfort.  Call your doctor if you experience significant pain, nausea, or vomiting, fever > 101 or other signs of infection. [phone] or [phone] Specific instructions:  Follow-up Information    BRASINGTON,CHADWICK, MD Follow up on 01/04/2017.   Specialty:  Ophthalmology Why:  2:10 Contact information: [address]   [place] [address] [phone]

## 2017-01-03 NOTE — Anesthesia Postprocedure Evaluation (Signed)
Anesthesia Post Note  Patient: Bethany [name]  Procedure(s) Performed: Procedure(s) (LRB): CATARACT EXTRACTION PHACO AND INTRAOCULAR LENS PLACEMENT ([facility]) (Right)  Patient location during evaluation: Short Stay Anesthesia Type: MAC Level of consciousness: awake and alert Pain management: pain level controlled Vital Signs Assessment: post-procedure vital signs reviewed and stable Respiratory status: spontaneous breathing Cardiovascular status: blood pressure returned to baseline Postop Assessment: no headache Anesthetic complications: no     Last Vitals:  Vitals:   01/03/17 0922  BP: 122/72  Pulse: 71  Resp: 12  Temp: 36.6 C    Last Pain:  Vitals:   01/03/17 0922  TempSrc: Oral                 Buckner Malta

## 2017-01-03 NOTE — Anesthesia Post-op Follow-up Note (Cosign Needed)
Anesthesia QCDR form completed.        

## 2017-01-03 NOTE — Anesthesia Preprocedure Evaluation (Signed)
Anesthesia Evaluation  Patient identified by MRN, date of birth, ID band Patient awake    Reviewed: Allergy & Precautions, NPO status , Patient's Chart, lab work & pertinent test results  History of Anesthesia Complications (+) PONV and history of anesthetic complications  Airway Mallampati: III       Dental   Pulmonary shortness of breath,           Cardiovascular hypertension, Pt. on medications and Pt. on home beta blockers +CHF and + Orthopnea  + Cardiac Defibrillator      Neuro/Psych Anxiety Depression negative neurological ROS     GI/Hepatic GERD  Medicated and Controlled,  Endo/Other  diabetes, Type 2, Oral Hypoglycemic AgentsHypothyroidism   Renal/GU      Musculoskeletal   Abdominal   Peds  Hematology  (+) anemia ,   Anesthesia Other Findings   Reproductive/Obstetrics                             Anesthesia Physical Anesthesia Plan  ASA: III  Anesthesia Plan: MAC   Post-op Pain Management:    Induction: Intravenous  Airway Management Planned:   Additional Equipment:   Intra-op Plan:   Post-operative Plan:   Informed Consent: I have reviewed the patients History and Physical, chart, labs and discussed the procedure including the risks, benefits and alternatives for the proposed anesthesia with the patient or authorized representative who has indicated his/her understanding and acceptance.     Plan Discussed with:   Anesthesia Plan Comments:         Anesthesia Quick Evaluation

## 2017-01-03 NOTE — Op Note (Signed)
OPERATIVE NOTE  Bethany [name] [id number] 01/03/2017   PREOPERATIVE DIAGNOSIS:  Nuclear Sclerotic Cataract Right Eye H25.11   POSTOPERATIVE DIAGNOSIS: Nuclear Sclerotic Cataract Right Eye H25.11          PROCEDURE:  Phacoemusification with posterior chamber intraocular lens placement of the right eye   LENS:   Implant Name Type Inv. Item Serial No. Manufacturer Lot No. LRB No. Used  LENS IOL DIOP 24.0 - [id number] Intraocular Lens LENS IOL DIOP 24.0 [id number] AMO   Right 1       ULTRASOUND TIME: 20 %  of 0 minutes 50 seconds, CDE 10.0  SURGEON:  Deirdre Evener, MD   ANESTHESIA:  Topical with tetracaine drops and 2% Xylocaine jelly, augmented with 1% preservative-free intracameral lidocaine.    COMPLICATIONS:  None.   DESCRIPTION OF PROCEDURE:  The patient was identified in the holding room and transported to the operating room and placed in the supine position under the operating microscope. Theright eye was identified as the operative eye and it was prepped and draped in the usual sterile ophthalmic fashion.   A 1 millimeter clear-corneal paracentesis was made at the 12:00 position.  0.5 ml of preservative-free 1% lidocaine was injected into the anterior chamber. The anterior chamber was filled with Viscoat viscoelastic.  A 2.4 millimeter keratome was used to make a near-clear corneal incision at the 9:00 position. A curvilinear capsulorrhexis was made with a cystotome and capsulorrhexis forceps.  Balanced salt solution was used to hydrodissect and hydrodelineate the nucleus.   Phacoemulsification was then used in stop and chop fashion to remove the lens nucleus and epinucleus.  The remaining cortex was then removed using the irrigation and aspiration handpiece. Provisc was then placed into the capsular bag to distend it for lens placement.  A lens was then injected into the capsular bag.  The remaining viscoelastic was aspirated.  Wounds were hydrated with balanced  salt solution.  The anterior chamber was inflated to a physiologic pressure with balanced salt solution. Vigamox 0.2 ml of a 1mg  per ml solution was injected into the anterior chamber for a dose of 0.2 mg of intracameral antibiotic at the completion of the case. Miostat was placed into the anterior chamber to constrict the pupil.  No wound leaks were noted.  Topical Vigamox drops and Maxitrol ointment were applied to the eye.  The patient was taken to the recovery room in stable condition without complications of anesthesia or surgery.  [name] 01/03/2017, 10:47 AM

## 2017-01-03 NOTE — H&P (Signed)
The History and Physical notes are on paper, have been signed, and are to be scanned. The patient remains stable and unchanged from the H&P.   Previous H&P reviewed, patient examined, and there are no changes.  Kaaren Nass 01/03/2017 9:29 AM

## 2017-01-04 ENCOUNTER — Encounter: Payer: Self-pay | Admitting: Cardiology

## 2017-01-04 ENCOUNTER — Encounter: Payer: Self-pay | Admitting: Ophthalmology

## 2017-01-10 ENCOUNTER — Encounter: Payer: Self-pay | Admitting: Family

## 2017-01-10 ENCOUNTER — Ambulatory Visit: Payer: Medicare PPO | Attending: Family | Admitting: Family

## 2017-01-10 VITALS — BP 106/62 | HR 78 | Resp 18 | Ht 61.0 in | Wt 133.0 lb

## 2017-01-10 DIAGNOSIS — F329 Major depressive disorder, single episode, unspecified: Secondary | ICD-10-CM | POA: Diagnosis not present

## 2017-01-10 DIAGNOSIS — Z95810 Presence of automatic (implantable) cardiac defibrillator: Secondary | ICD-10-CM | POA: Insufficient documentation

## 2017-01-10 DIAGNOSIS — Z8249 Family history of ischemic heart disease and other diseases of the circulatory system: Secondary | ICD-10-CM | POA: Insufficient documentation

## 2017-01-10 DIAGNOSIS — Z823 Family history of stroke: Secondary | ICD-10-CM | POA: Insufficient documentation

## 2017-01-10 DIAGNOSIS — Z87891 Personal history of nicotine dependence: Secondary | ICD-10-CM | POA: Diagnosis not present

## 2017-01-10 DIAGNOSIS — I950 Idiopathic hypotension: Secondary | ICD-10-CM

## 2017-01-10 DIAGNOSIS — F419 Anxiety disorder, unspecified: Secondary | ICD-10-CM | POA: Diagnosis not present

## 2017-01-10 DIAGNOSIS — Z8042 Family history of malignant neoplasm of prostate: Secondary | ICD-10-CM | POA: Insufficient documentation

## 2017-01-10 DIAGNOSIS — D649 Anemia, unspecified: Secondary | ICD-10-CM | POA: Insufficient documentation

## 2017-01-10 DIAGNOSIS — I420 Dilated cardiomyopathy: Secondary | ICD-10-CM | POA: Insufficient documentation

## 2017-01-10 DIAGNOSIS — Z833 Family history of diabetes mellitus: Secondary | ICD-10-CM | POA: Insufficient documentation

## 2017-01-10 DIAGNOSIS — I5022 Chronic systolic (congestive) heart failure: Secondary | ICD-10-CM | POA: Insufficient documentation

## 2017-01-10 DIAGNOSIS — I959 Hypotension, unspecified: Secondary | ICD-10-CM | POA: Insufficient documentation

## 2017-01-10 DIAGNOSIS — Z79899 Other long term (current) drug therapy: Secondary | ICD-10-CM | POA: Insufficient documentation

## 2017-01-10 DIAGNOSIS — E785 Hyperlipidemia, unspecified: Secondary | ICD-10-CM | POA: Insufficient documentation

## 2017-01-10 DIAGNOSIS — Z7982 Long term (current) use of aspirin: Secondary | ICD-10-CM | POA: Insufficient documentation

## 2017-01-10 DIAGNOSIS — E039 Hypothyroidism, unspecified: Secondary | ICD-10-CM | POA: Insufficient documentation

## 2017-01-10 DIAGNOSIS — K219 Gastro-esophageal reflux disease without esophagitis: Secondary | ICD-10-CM | POA: Diagnosis not present

## 2017-01-10 DIAGNOSIS — Z7984 Long term (current) use of oral hypoglycemic drugs: Secondary | ICD-10-CM | POA: Diagnosis not present

## 2017-01-10 DIAGNOSIS — Z882 Allergy status to sulfonamides status: Secondary | ICD-10-CM | POA: Diagnosis not present

## 2017-01-10 DIAGNOSIS — E119 Type 2 diabetes mellitus without complications: Secondary | ICD-10-CM | POA: Insufficient documentation

## 2017-01-10 DIAGNOSIS — R413 Other amnesia: Secondary | ICD-10-CM | POA: Insufficient documentation

## 2017-01-10 MED ORDER — SPIRONOLACTONE 25 MG PO TABS: 25.0000 mg | ORAL_TABLET | Freq: Every day | ORAL | 3 refills | Status: DC

## 2017-01-10 NOTE — Patient Instructions (Signed)
Continue weighing daily and call for an overnight weight gain of > 2 pounds or a weekly weight gain of >5 pounds. 

## 2017-01-10 NOTE — Progress Notes (Signed)
Patient ID: Bethany [name], female    DOB: 12-20-1945, 71 y.o.   MRN: [id number]  HPI  Bethany [name] is a 71 y/o female with a history of valvular heart disease, thyroid disease, thrombocytopenia, hyperlipidemia, GERD, dysrhythmia (AICD), DM, depression, anxiety, anemia, remote tobacco use and chronic heart failure.   Last echo was done 02/05/16 and showed an EF of 35% along with mild AR/ MR/ TR. EF has improved from 15% in March 2016. Had a cardiac catheterization in 2015.  Was in the ED on 12/17/16 with URI symptoms. Was evaluated and discharged home. Was in the ED on 07/30/16 with left arm and neck pain. Troponins were negative. She was treated and discharged home.     She presents today for a follow-up visit with fatigue with little exertion. Does feel like she's sleeping well. Denies any shortness of breath or swelling in her legs/abdomen. Has not had any firings of her AICD. Has had both cataracts removed since she was last here and reports a dramatic improvement in her vision.   Past Medical History:  Diagnosis Date  . AICD (automatic cardioverter/defibrillator) present   . Anemia   . Anxiety   . CHF (congestive heart failure) ([place])   . Complication of anesthesia   . Depression   . Diabetes ([place])   . Dilated cardiomyopathy ([place])   . Diverticulosis   . Dyspnea   . Dysrhythmia   . Edema    FEET/ANKLES OCCAS  . GERD (gastroesophageal reflux disease)   . Hyperlipidemia   . Hypothyroidism   . LV dysfunction   . Orthopnea    USES 3 PILLOWS  . PONV (postoperative nausea and vomiting)   . Reflux   . Thrombocytopenia ([place])   . Thyroid disease   . VHD (valvular heart disease)    Past Surgical History:  Procedure Laterality Date  . ABDOMINAL HYSTERECTOMY    . BI-VENTRICULAR IMPLANTABLE CARDIOVERTER DEFIBRILLATOR [name] 02/11/2015   Procedure: BI-VENTRICULAR IMPLANTABLE CARDIOVERTER DEFIBRILLATOR  (CRT-D);  Surgeon: Deboraha Sprang, MD;  Location: [facility] CATH LAB;  Service: Cardiovascular;   Laterality: [name];  . BONE MARROW BIOPSY  2007   spine  . CARDIAC CATHETERIZATION    . CATARACT EXTRACTION W/PHACO Left 10/27/2016   Procedure: CATARACT EXTRACTION PHACO AND INTRAOCULAR LENS PLACEMENT (IOC);  Surgeon: Leandrew Koyanagi, MD;  Location: [facility] ORS;  Service: Ophthalmology;  Laterality: Left;  [place]  01:12 AP%  21.0 CDE  11.59 Fluid pack lot # [id number]  . CATARACT EXTRACTION W/PHACO Right 01/03/2017   Procedure: CATARACT EXTRACTION PHACO AND INTRAOCULAR LENS PLACEMENT (IOC);  Surgeon: Leandrew Koyanagi, MD;  Location: [facility] ORS;  Service: Ophthalmology;  Laterality: Right;  [place] 00:50 AP% 19.5 CDE 9.94 fluid pack lot # [id number]  . CHOLECYSTECTOMY    . INSERT / REPLACE / REMOVE PACEMAKER     AICD  . LEAD REVISION [name] 02/11/2015   Procedure: LEAD REVISION;  Surgeon: Deboraha Sprang, MD;  Location: [facility] CATH LAB;  Service: Cardiovascular;  Laterality: [name];  . PARTIAL HYSTERECTOMY  1977  . TOOTH EXTRACTION  09/23/2015   Family History  Problem Relation Age of Onset  . Heart disease Mother   . Heart attack Mother   . Prostate cancer Father   . Ulcers Father   . Stroke Sister   . Heart attack Sister   . Diabetes Sister   . Heart attack Brother   . Stroke Brother    Social History  Substance Use Topics  . Smoking status: Never  Smoker  . Smokeless tobacco: Former Systems developer    Types: Snuff    Quit date: 10/27/1969  . Alcohol use No   Allergies  Allergen Reactions  . Other Other (See Comments)    NO BLOOD PRODUCTS- JEHOVAHS WITNESS  . Sulfa Antibiotics Other (See Comments)    Loss of taste   Prior to Admission medications   Medication Sig Start Date End Date Taking? Authorizing Provider  acetaminophen (TYLENOL) 500 MG tablet Take 1,000 mg by mouth 2 (two) times daily as needed for mild pain or moderate pain.   Yes Historical Provider, MD  Alcohol Swabs (B-D SINGLE USE SWABS REGULAR) PADS CHECK BLOOD SUGAR DAILY 07/25/16  Yes Birdie Sons, MD  aspirin EC 81 MG tablet Take 81  mg by mouth daily.   Yes Historical Provider, MD  carvedilol (COREG) 6.25 MG tablet Take 1 tablet by mouth 2 (two) times daily. 03/26/15  Yes Historical Provider, MD  ferrous sulfate 325 (65 FE) MG tablet Take 325 mg by mouth 2 (two) times daily with a meal.   Yes Historical Provider, MD  furosemide (LASIX) 20 MG tablet Take 1 tablet (20 mg total) by mouth every other day. 12/20/16  Yes Birdie Sons, MD  gentamicin-prednisoLONE 0.3-1 % ophthalmic drops Place 1 drop into the right eye 4 (four) times daily.   Yes Historical Provider, MD  mirtazapine (REMERON) 7.5 MG tablet Take 1 tablet (7.5 mg total) by mouth at bedtime. 07/07/16  Yes Birdie Sons, MD  pantoprazole (PROTONIX) 40 MG tablet TAKE 1 TABLET EVERY DAY 06/10/16  Yes Birdie Sons, MD  sacubitril-valsartan (ENTRESTO) 24-26 MG Take 1 tablet by mouth 2 (two) times daily. 05/09/16  Yes Alisa Graff, FNP  senna (SENOKOT) 8.6 MG TABS tablet Take 1 tablet by mouth daily as needed for mild constipation.   Yes Historical Provider, MD  SitaGLIPtin-MetFORMIN HCl [phone] MG TB24 Take 1 tablet by mouth daily. Replaces Januvia and metformin 07/07/16  Yes Birdie Sons, MD  spironolactone (ALDACTONE) 25 MG tablet Take 1 tablet (25 mg total) by mouth daily. 01/10/17  Yes Alisa Graff, FNP  SYNTHROID 50 MCG tablet TAKE 1 TABLET EVERY DAY 06/15/16  Yes Birdie Sons, MD  TRUE METRIX BLOOD GLUCOSE TEST test strip CHECK BLOOD SUGAR DAILY 09/20/16  Yes Birdie Sons, MD  TRUEPLUS LANCETS 33G MISC CHECK BLOOD SUGAR ONE TIME A DAY   07/20/16  Yes Birdie Sons, MD   Review of Systems  Constitutional: Positive for fatigue. Negative for appetite change.  HENT: Negative for congestion, postnasal drip and sore throat.   Eyes: Negative.   Respiratory: Positive for cough. Negative for chest tightness and shortness of breath.   Cardiovascular: Negative for chest pain, palpitations and leg swelling.  Gastrointestinal: Negative for abdominal distention and  abdominal pain.  Endocrine: Negative.   Genitourinary: Negative.   Musculoskeletal: Negative for back pain and neck pain.  Skin: Negative.   Allergic/Immunologic: Negative.   Neurological: Positive for light-headedness. Negative for dizziness.  Hematological: Negative for adenopathy. Does not bruise/bleed easily.  Psychiatric/Behavioral: Negative for dysphoric mood, sleep disturbance (sleeping on 2-3 pillows) and suicidal ideas. The patient is not nervous/anxious.    Vitals:   01/10/17 1005  BP: 106/62  Pulse: 78  Resp: 18  SpO2: 98%  Weight: 133 lb (60.3 kg)  Height: '5\' 1"'  (1.549 m)   Wt Readings from Last 3 Encounters:  01/10/17 133 lb (60.3 kg)  01/03/17 128 lb (58.1 kg)  12/20/16 136 lb (61.7 kg)   Lab Results  Component Value Date   CREATININE 0.64 12/17/2016   CREATININE 0.80 11/03/2016   CREATININE 0.89 07/30/2016    Physical Exam  Constitutional: She is oriented to person, place, and time. She appears well-developed and well-nourished.  HENT:  Head: Normocephalic and atraumatic.  Eyes: Conjunctivae are normal. Pupils are equal, round, and reactive to light.  Neck: Normal range of motion. Neck supple. No JVD present.  Cardiovascular: Normal rate and regular rhythm.   Pulmonary/Chest: Effort normal. She has no wheezes. She has no rales.  Abdominal: Soft. She exhibits no distension. There is no tenderness.  Musculoskeletal: She exhibits no edema or tenderness.  Neurological: She is alert and oriented to person, place, and time.  Skin: Skin is warm and dry.  Psychiatric: She has a normal mood and affect. Her behavior is normal. Thought content normal.  Nursing note and vitals reviewed.   Assessment & Plan:    1: Chronic heart failure with reduced ejection fraction-  - NYHA class III - euvolemic - weighing daily and her weight chart shows a stable weight. Reminded to call for an overnight weight gain of >2 pounds or a weekly weight gain of >5 pounds - on low  dose entresto  - diuretic is now being taken every other day - not adding salt to her food - saw cardiologist Nehemiah Massed) on 12/19/16 and returns to him 04/10/17  2: Hypotension-  - BP on the low side so unable to titrate up entresto or carvedilol - occasional lightheadedness - encouraged slow position changes  3: Diabetes-  - glucose was 120 - saw PCP Caryn Section) on 12/20/16 and returns to him 03/20/17  Medication list was reviewed.   Return in 6 months or sooner for any questions/problems before then.   4: Memory loss- She says that she's a little anxious about her short-term memory loss. She has an appointment currently scheduled with the neurologist on 09/14/16.

## 2017-01-25 ENCOUNTER — Encounter: Payer: Self-pay | Admitting: Physician Assistant

## 2017-01-25 ENCOUNTER — Ambulatory Visit (INDEPENDENT_AMBULATORY_CARE_PROVIDER_SITE_OTHER): Payer: Medicare PPO | Admitting: Physician Assistant

## 2017-01-25 ENCOUNTER — Ambulatory Visit
Admission: RE | Admit: 2017-01-25 | Discharge: 2017-01-25 | Disposition: A | Discharge status: Home / Self Care | Payer: Medicare PPO | Source: Ambulatory Visit | Attending: Physician Assistant | Admitting: Physician Assistant

## 2017-01-25 VITALS — BP 100/60 | HR 78 | Temp 98.2°F | Resp 16 | Wt 136.8 lb

## 2017-01-25 DIAGNOSIS — M545 Low back pain: Secondary | ICD-10-CM | POA: Diagnosis not present

## 2017-01-25 DIAGNOSIS — R935 Abnormal findings on diagnostic imaging of other abdominal regions, including retroperitoneum: Secondary | ICD-10-CM | POA: Diagnosis not present

## 2017-01-25 DIAGNOSIS — K5900 Constipation, unspecified: Secondary | ICD-10-CM | POA: Insufficient documentation

## 2017-01-25 DIAGNOSIS — M5136 Other intervertebral disc degeneration, lumbar region: Secondary | ICD-10-CM | POA: Insufficient documentation

## 2017-01-25 LAB — POCT URINALYSIS DIPSTICK
Bilirubin, UA: NEGATIVE
Blood, UA: NEGATIVE
Glucose, UA: NEGATIVE
Ketones, UA: NEGATIVE
Leukocytes, UA: NEGATIVE
Nitrite, UA: NEGATIVE
Protein, UA: NEGATIVE
Spec Grav, UA: 1.005 (ref 1.030–1.035)
Urobilinogen, UA: 0.2 (ref ?–2.0)
pH, UA: 6 (ref 5.0–8.0)

## 2017-01-25 NOTE — Progress Notes (Signed)
Patient: Bethany [name] Female    DOB: 03/01/1946   71 y.o.   MRN: [id number] Visit Date: 01/25/2017  Today's Provider: Trey Sailors, PA-C   Chief Complaint  Patient presents with  . Back Pain   Subjective:    Back Pain  This is a chronic problem. The current episode started 1 to 4 weeks ago (two weeks recent flare, but ongoing since 11/2015). The problem occurs constantly. The problem has been gradually worsening since onset. The pain is present in the lumbar spine. The quality of the pain is described as shooting and aching. Radiates to: Per patient pain radiates to her lower abdomen. The pain is at a severity of 10/10. The pain is severe. The pain is worse during the day. The symptoms are aggravated by bending, lying down and position (Moving). Associated symptoms include abdominal pain (with the back pain). Pertinent negatives include no bladder incontinence, bowel incontinence, chest pain, dysuria, fever, headaches, leg pain, numbness, pelvic pain, tingling or weakness. Risk factors include history of osteoporosis. Treatments tried: Tylenol on Sunday took 4 tablets of . The treatment provided no relief.   Patient is a 71 y/o woman with NYHA class III heart failure w/ 35% EF on recent echo and pacemaker, DM II, and history of osteoporosis presenting today for the above. This problem is documented in EMR on 11/2015 with accompanying DG Lumbar spine which showed L5/S1 anterolisthesis and facet degenerative disease. Patient has taken Tylenol without relief. No IVDU, injuries, active cancer, urinary symptoms.     Allergies  Allergen Reactions  . Other Other (See Comments)    NO BLOOD PRODUCTS- JEHOVAHS WITNESS  . Sulfa Antibiotics Other (See Comments)    Loss of taste     Current Outpatient Prescriptions:  .  acetaminophen (TYLENOL) 500 MG tablet, Take 1,000 mg by mouth 2 (two) times daily as needed for mild pain or moderate pain., Disp: , Rfl:  .  Alcohol Swabs (B-D  SINGLE USE SWABS REGULAR) PADS, CHECK BLOOD SUGAR DAILY, Disp: 100 each, Rfl: 2 .  aspirin EC 81 MG tablet, Take 81 mg by mouth daily., Disp: , Rfl:  .  carvedilol (COREG) 6.25 MG tablet, Take 1 tablet by mouth 2 (two) times daily., Disp: , Rfl:  .  ferrous sulfate 325 (65 FE) MG tablet, Take 325 mg by mouth 2 (two) times daily with a meal., Disp: , Rfl:  .  furosemide (LASIX) 20 MG tablet, Take 1 tablet (20 mg total) by mouth every other day., Disp: 45 tablet, Rfl: 0 .  gentamicin-prednisoLONE 0.3-1 % ophthalmic drops, Place 1 drop into the right eye 4 (four) times daily., Disp: , Rfl:  .  mirtazapine (REMERON) 7.5 MG tablet, Take 1 tablet (7.5 mg total) by mouth at bedtime., Disp: 90 tablet, Rfl: 3 .  pantoprazole (PROTONIX) 40 MG tablet, TAKE 1 TABLET EVERY DAY, Disp: 90 tablet, Rfl: 3 .  sacubitril-valsartan (ENTRESTO) 24-26 MG, Take 1 tablet by mouth 2 (two) times daily., Disp: 180 tablet, Rfl: 3 .  senna (SENOKOT) 8.6 MG TABS tablet, Take 1 tablet by mouth daily as needed for mild constipation., Disp: , Rfl:  .  SitaGLIPtin-MetFORMIN HCl [phone] MG TB24, Take 1 tablet by mouth daily. Replaces Januvia and metformin, Disp: 90 tablet, Rfl: 3 .  spironolactone (ALDACTONE) 25 MG tablet, Take 1 tablet (25 mg total) by mouth daily., Disp: 30 tablet, Rfl: 3 .  SYNTHROID 50 MCG tablet, TAKE 1 TABLET EVERY DAY, Disp:  90 tablet, Rfl: 3 .  TRUE METRIX BLOOD GLUCOSE TEST test strip, CHECK BLOOD SUGAR DAILY, Disp: 100 each, Rfl: 1 .  TRUEPLUS LANCETS 33G MISC, CHECK BLOOD SUGAR ONE TIME A DAY  , Disp: 100 each, Rfl: 4  Review of Systems  Constitutional: Negative for fever.  Cardiovascular: Negative for chest pain.  Gastrointestinal: Positive for abdominal pain (with the back pain). Negative for bowel incontinence.  Genitourinary: Negative for bladder incontinence, dysuria and pelvic pain.  Musculoskeletal: Positive for back pain.  Neurological: Negative for tingling, weakness, numbness and headaches.      Social History  Substance Use Topics  . Smoking status: Never Smoker  . Smokeless tobacco: Former Neurosurgeon    Types: Snuff    Quit date: 10/27/1969  . Alcohol use No   Objective:   BP 100/60 (BP Location: Left Arm, Patient Position: Sitting, Cuff Size: Normal)   Pulse 78   Temp 98.2 F (36.8 C)   Resp 16   Wt 136 lb 12.8 oz (62.1 kg)   LMP  (LMP Unknown)   SpO2 99%   BMI 25.85 kg/m  Vitals:   01/25/17 1054  BP: 100/60  Pulse: 78  Resp: 16  Temp: 98.2 F (36.8 C)  SpO2: 99%  Weight: 136 lb 12.8 oz (62.1 kg)     Physical Exam  Constitutional: She appears well-developed and well-nourished. She does not appear ill.  Cardiovascular: Normal rate.   Abdominal: Soft. Bowel sounds are normal. She exhibits no distension and no mass. There is no tenderness. There is no rebound and no guarding.  Musculoskeletal:       Lumbar back: She exhibits decreased range of motion and pain. She exhibits no tenderness, no bony tenderness, no swelling, no edema, no deformity, no laceration and no spasm.  Neurological: She is alert. She displays no Babinski's sign on the right side. She displays no Babinski's sign on the left side.  Reflex Scores:      Patellar reflexes are 2+ on the right side and 2+ on the left side.      Achilles reflexes are 2+ on the right side and 2+ on the left side. Bilateral lower extremities 5/5 strength.  Skin: Skin is warm and dry.        Assessment & Plan:     1. Bilateral low back pain, unspecified chronicity, with sciatica presence unspecified  UA in office today negative. Will get imaging as below. Do think this is acute on chronic flare of arthritis. Considering her history of osteoporosis, diabetes, and heart failure, patient is poor candidate for oral steroids and NSAIDs. Have recommended her to stick with Tylenol, do warm soaks, stretching. Have also referred to Dr. Yves Dill for non-op management of DDD.   - POCT urinalysis dipstick - DG Lumbar Spine  Complete; Future - DG Abd 1 View; Future - Ambulatory referral to Orthopedics  Return if symptoms worsen or fail to improve.  The entirety of the information documented in the History of Present Illness, Review of Systems and Physical Exam were personally obtained by me. Portions of this information were initially documented by Joceline and reviewed by me for thoroughness and accuracy.            Trey Sailors, PA-C  [facility]

## 2017-01-25 NOTE — Patient Instructions (Signed)
Joint Pain Joint pain can be caused by many things. The joint can be bruised, infected, weak from aging, or sore from exercise. The pain will probably go away if you follow your doctor's instructions for home care. If your joint pain continues, more tests may be needed to help find the cause of your condition. Follow these instructions at home: Watch your condition for any changes. Follow these instructions as told to lessen the pain that you are feeling:  Take medicines only as told by your doctor.  Rest the sore joint for as long as told by your doctor. If your doctor tells you to, raise (elevate) the painful joint above the level of your heart while you are sitting or lying down.  Do not do things that cause pain or make the pain worse.  If told, put ice on the painful area:  Put ice in a plastic bag.  Place a towel between your skin and the bag.  Leave the ice on for 20 minutes, 2-3 times per day.  Wear an elastic bandage, splint, or sling as told by your doctor. Loosen the bandage or splint if your fingers or toes lose feeling (become numb) and tingle, or if they turn cold and blue.  Begin exercising or stretching the joint as told by your doctor. Ask your doctor what types of exercise are safe for you.  Keep all follow-up visits as told by your doctor. This is important. Contact a doctor if:  Your pain gets worse and medicine does not help it.  Your joint pain does not get better in 3 days.  You have more bruising or swelling.  You have a fever.  You lose 10 pounds (4.5 kg) or more without trying. Get help right away if:  You are not able to move the joint.  Your fingers or toes become numb or they turn cold and blue. This information is not intended to replace advice given to you by your health care provider. Make sure you discuss any questions you have with your health care provider. Document Released: 10/12/2009 Document Revised: 03/31/2016 Document Reviewed:  08/05/2014 Elsevier Interactive Patient Education  2017 [employer] Inc.  

## 2017-01-27 ENCOUNTER — Other Ambulatory Visit: Payer: Self-pay | Admitting: Physician Assistant

## 2017-01-27 DIAGNOSIS — R935 Abnormal findings on diagnostic imaging of other abdominal regions, including retroperitoneum: Secondary | ICD-10-CM

## 2017-01-27 NOTE — Progress Notes (Signed)
Pt advised-aa 

## 2017-01-27 NOTE — Progress Notes (Signed)
Have sent in abdominal xray 2 views to better assess finding. Sounds like this may have been an undigested pill, but would like to better assess. Patient may get this on walk in basis at [facility]. She may go at any time at her convenience, thank you.

## 2017-01-27 NOTE — Progress Notes (Signed)
Bethany [name] I just wanted to verify with you just have patient get xray whenever she can today or next week or does she need to wait so many days?-aa

## 2017-01-30 ENCOUNTER — Ambulatory Visit
Admission: RE | Admit: 2017-01-30 | Discharge: 2017-01-30 | Disposition: A | Discharge status: Home / Self Care | Payer: Medicare PPO | Source: Ambulatory Visit | Attending: Physician Assistant | Admitting: Physician Assistant

## 2017-01-30 DIAGNOSIS — Z9049 Acquired absence of other specified parts of digestive tract: Secondary | ICD-10-CM | POA: Insufficient documentation

## 2017-01-30 DIAGNOSIS — R935 Abnormal findings on diagnostic imaging of other abdominal regions, including retroperitoneum: Secondary | ICD-10-CM | POA: Diagnosis not present

## 2017-02-28 ENCOUNTER — Other Ambulatory Visit: Payer: Self-pay | Admitting: Family Medicine

## 2017-02-28 DIAGNOSIS — I5022 Chronic systolic (congestive) heart failure: Secondary | ICD-10-CM

## 2017-03-02 ENCOUNTER — Telehealth: Payer: Self-pay | Admitting: Family Medicine

## 2017-03-02 NOTE — Telephone Encounter (Signed)
LMOVM for pt to return call 

## 2017-03-02 NOTE — Telephone Encounter (Signed)
Spoke with pt about her stool. Advised pt since she is not having any symptoms or issues with bowel movements she should refrain from bringing sample to office. Also advised her to pay close attention to her stools and let us know if she has any problems.

## 2017-03-02 NOTE — Telephone Encounter (Signed)
Pt wants to bring in something that she passed in her stool last night.  Thinks it is a pill.  She has no symptoms, no stomach pain, bleeding , diarrhea.  Please advise  [phone]  Thanks Barth Kirks

## 2017-03-03 NOTE — Telephone Encounter (Signed)
It is probably the Janumet. Its fairly common for the pill to not completely dissolve and show up in the stool. The active ingredients will still be absorbed and work normally.

## 2017-03-06 ENCOUNTER — Other Ambulatory Visit: Payer: Self-pay | Admitting: Family Medicine

## 2017-03-06 DIAGNOSIS — E119 Type 2 diabetes mellitus without complications: Secondary | ICD-10-CM

## 2017-03-08 ENCOUNTER — Telehealth: Payer: Self-pay | Admitting: Family Medicine

## 2017-03-08 NOTE — Telephone Encounter (Signed)
Called Pt to schedule AWV with [facility] - knb °

## 2017-03-13 ENCOUNTER — Other Ambulatory Visit: Payer: Self-pay | Admitting: Family Medicine

## 2017-03-13 ENCOUNTER — Other Ambulatory Visit: Payer: Self-pay

## 2017-03-13 ENCOUNTER — Telehealth: Payer: Self-pay | Admitting: Family Medicine

## 2017-03-13 MED ORDER — SACUBITRIL-VALSARTAN 24-26 MG PO TABS: 1.0000 | ORAL_TABLET | Freq: Two times a day (BID) | ORAL | 3 refills | Status: DC

## 2017-03-13 NOTE — Telephone Encounter (Signed)
Error/MW °

## 2017-03-13 NOTE — Telephone Encounter (Signed)
Rx was sent to [facility] 03/06/17.

## 2017-03-13 NOTE — Telephone Encounter (Signed)
[facility] confirmed that they did receive rx for alcohol swaps on 03/06/17.

## 2017-03-13 NOTE — Telephone Encounter (Signed)
Pt contacted office for refill request on the following medications:  [facility] mail order.  TR#[id number]  Alcohol Swabs (B-D SINGLE USE SWABS REGULAR) PADS

## 2017-03-20 ENCOUNTER — Ambulatory Visit: Payer: Medicare PPO | Admitting: Family Medicine

## 2017-03-23 ENCOUNTER — Ambulatory Visit (INDEPENDENT_AMBULATORY_CARE_PROVIDER_SITE_OTHER): Payer: Medicare PPO

## 2017-03-23 VITALS — BP 80/58 | HR 72 | Temp 98.0°F | Ht 61.0 in | Wt 134.6 lb

## 2017-03-23 DIAGNOSIS — Z1159 Encounter for screening for other viral diseases: Secondary | ICD-10-CM

## 2017-03-23 DIAGNOSIS — Z0000 Encounter for general adult medical examination without abnormal findings: Secondary | ICD-10-CM | POA: Diagnosis not present

## 2017-03-23 NOTE — Progress Notes (Signed)
Subjective:   Bethany [name] is a 71 y.o. female who presents for an Initial Medicare Annual Wellness Visit.  Review of Systems    [name]  Cardiac Risk Factors include: advanced age (>13mn, >>8women);diabetes mellitus;dyslipidemia;hypertension     Objective:    Today's Vitals   03/23/17 1447 03/23/17 1452  BP: (!) 80/58   Pulse: 72   Temp: 98 F (36.7 C)   TempSrc: Oral   Weight: 134 lb 9.6 oz (61.1 kg)   Height: _0  (1.549 m)   PainSc: 0-No pain 0-No pain   Body mass index is 25.43 kg/m.   Current Medications (verified) Outpatient Encounter Prescriptions as of 03/23/2017  Medication Sig  . acetaminophen (TYLENOL) 500 MG tablet Take 1,000 mg by mouth 2 (two) times daily as needed for mild pain or moderate pain.  . Alcohol Swabs (B-D SINGLE USE SWABS REGULAR) PADS CHECK BLOOD SUGAR DAILY  . aspirin EC 81 MG tablet Take 81 mg by mouth daily.  . carvedilol (COREG) 6.25 MG tablet Take 1 tablet by mouth 2 (two) times daily.  . ferrous sulfate 325 (65 FE) MG tablet Take 325 mg by mouth 2 (two) times daily with a meal.  . furosemide (LASIX) 20 MG tablet TAKE 1 TABLET EVERY OTHER DAY  . mirtazapine (REMERON) 7.5 MG tablet Take 1 tablet (7.5 mg total) by mouth at bedtime. (Patient taking differently: Take 7.5 mg by mouth at bedtime. )  . pantoprazole (PROTONIX) 40 MG tablet TAKE 1 TABLET EVERY DAY  . sacubitril-valsartan (ENTRESTO) 24-26 MG Take 1 tablet by mouth 2 (two) times daily.  .Marland Kitchensenna (SENOKOT) 8.6 MG TABS tablet Take 1 tablet by mouth daily as needed for mild constipation.  . SitaGLIPtin-MetFORMIN HCl [phone] MG TB24 Take 1 tablet by mouth daily. Replaces Januvia and metformin  . spironolactone (ALDACTONE) 25 MG tablet Take 1 tablet (25 mg total) by mouth daily.  .Marland KitchenSYNTHROID 50 MCG tablet TAKE 1 TABLET EVERY DAY  . TRUE METRIX BLOOD GLUCOSE TEST test strip CHECK BLOOD SUGAR DAILY  . TRUEPLUS LANCETS 33G MISC CHECK BLOOD SUGAR ONE TIME A DAY    . [DISCONTINUED]  gentamicin-prednisoLONE 0.3-1 % ophthalmic drops Place 1 drop into the right eye 4 (four) times daily.   No facility-administered encounter medications on file as of 03/23/2017.     Allergies (verified) Other and Sulfa antibiotics   History: Past Medical History:  Diagnosis Date  . AICD (automatic cardioverter/defibrillator) present   . Anemia   . Anxiety   . CHF (congestive heart failure) ([place]   . Complication of anesthesia   . Depression   . Diabetes ([place]   . Dilated cardiomyopathy ([place]   . Diverticulosis   . Dyspnea   . Dysrhythmia   . Edema    FEET/ANKLES OCCAS  . GERD (gastroesophageal reflux disease)   . Hyperlipidemia   . Hypothyroidism   . LV dysfunction   . Orthopnea    USES 3 PILLOWS  . PONV (postoperative nausea and vomiting)   . Reflux   . Thrombocytopenia ([place]   . Thyroid disease   . VHD (valvular heart disease)    Past Surgical History:  Procedure Laterality Date  . ABDOMINAL HYSTERECTOMY    . BI-VENTRICULAR IMPLANTABLE CARDIOVERTER DEFIBRILLATOR [name] 02/11/2015   Procedure: BI-VENTRICULAR IMPLANTABLE CARDIOVERTER DEFIBRILLATOR  (CRT-D);  Surgeon: SDeboraha Sprang MD;  [facility];  Service: Cardiovascular;  Laterality: [name];  . BONE MARROW BIOPSY  2007   spine  .  CARDIAC CATHETERIZATION    . CATARACT EXTRACTION W/PHACO Left 10/27/2016   Procedure: CATARACT EXTRACTION PHACO AND INTRAOCULAR LENS PLACEMENT (IOC);  Surgeon: Leandrew Koyanagi, MD;  Location: [facility] ORS;  Service: Ophthalmology;  Laterality: Left;  [place]  01:12 AP%  21.0 CDE  11.59 Fluid pack lot # [id number]  . CATARACT EXTRACTION W/PHACO Right 01/03/2017   Procedure: CATARACT EXTRACTION PHACO AND INTRAOCULAR LENS PLACEMENT (IOC);  Surgeon: Leandrew Koyanagi, MD;  Location: [facility] ORS;  Service: Ophthalmology;  Laterality: Right;  [place] 00:50 AP% 19.5 CDE 9.94 fluid pack lot # [id number]  . CHOLECYSTECTOMY    . INSERT / REPLACE / REMOVE PACEMAKER     AICD  . LEAD REVISION [name] 02/11/2015     Procedure: LEAD REVISION;  Surgeon: Deboraha Sprang, MD;  Location[facility] C[facility] LAB;  Service: Cardiovascular;  Laterality: [name];  . PARTIAL HYSTERECTOMY  1977  . TOOTH EXTRACTION  09/23/2015   Family History  Problem Relation Age of Onset  . Heart disease Mother   . Heart attack Mother   . Prostate cancer Father   . Ulcers Father   . Stroke Sister   . Heart attack Sister   . Diabetes Sister   . Heart attack Brother   . Stroke Brother    Social History   Occupational History  . retired    Social History Main Topics  . Smoking status: Never Smoker  . Smokeless tobacco: Former Systems developer    Types: Snuff    Quit date: 10/27/1969  . Alcohol use No  . Drug use: No  . Sexual activity: Not on file    Tobacco Counseling Counseling given: Not Answered   Activities of Daily Living In your present state of health, do you have any difficulty performing the following activities: 03/23/2017 01/10/2017  Hearing? N N  Vision? N N  Difficulty concentrating or making decisions? Tempie Donning  Walking or climbing stairs? N Y  Dressing or bathing? N N  Doing errands, shopping? N N  Preparing Food and eating ? N -  Using the Toilet? N -  In the past six months, have you accidently leaked urine? N -  Do you have problems with loss of bowel control? N -  Managing your Medications? N -  Managing your Finances? N -  Housekeeping or managing your Housekeeping? N -  Some recent data might be hidden    Immunizations and Health Maintenance Immunization History  Administered Date(s) Administered  . Influenza, High Dose Seasonal PF 07/07/2016  . Influenza,inj,Quad PF,36+ Mos 08/14/2015  . Pneumococcal Conjugate-13 09/18/2014  . Pneumococcal Polysaccharide-23 09/11/2015   Health Maintenance Due  Topic Date Due  . FOOT EXAM  03/15/2017    Patient Care Team: Birdie Sons, MD as PCP - General (Family Medicine) Corey Skains, MD as Consulting Physician (Cardiology) Deboraha Sprang, MD as  Consulting Physician (Cardiology) Alisa Graff, FNP as Nurse Practitioner (Family Medicine) Vladimir Crofts, MD as Consulting Physician (Neurology) Leandrew Koyanagi, MD as Referring Physician (Ophthalmology)  Indicate any recent Medical Services you may have received from other than [facility] providers in the past year (date may be approximate).     Assessment:   This is a routine wellness examination for Heath.   Hearing/Vision screen Vision Screening Comments: Pt sees Dr Wallace Going for vision checks every 6 months.  Dietary issues and exercise activities discussed: Current Exercise Habits: Home exercise routine, Type of exercise: walking (with job), Frequency (Times/Week): 7, Intensity: Mild, Exercise limited by: Other -  see comments (low energy)  Goals    . Eat more fruits and vegetables          Recommend increasing amount of vegetables in daily diet to at least two serving a day.    Marland Kitchen HEMOGLOBIN A1C < 7.0    . Peak Blood Glucose < 180      Depression Screen PHQ 2/9 Scores 03/23/2017 03/23/2017 01/10/2017 07/12/2016 04/01/2016 03/15/2016 12/17/2015  PHQ - 2 Score 0 0 0 0 0 0 0  PHQ- 9 Score 6 - - - - - -    Fall Risk Fall Risk  03/23/2017 01/10/2017 07/12/2016 04/01/2016 03/15/2016  Falls in the past year? _0     Cognitive Function:     6CIT Screen 03/23/2017  What Year? 0 points  What month? 0 points  What time? 0 points  Count back from 20 0 points  Months in reverse 0 points  Repeat phrase 6 points  Total Score 6    Screening Tests Health Maintenance  Topic Date Due  . FOOT EXAM  03/15/2017  . MAMMOGRAM  03/07/2018 (Originally 04/16/2016)  . TETANUS/TDAP  11/07/2026 (Originally [birth year]/1965)  . INFLUENZA VACCINE  06/07/2017  . HEMOGLOBIN A1C  06/19/2017  . OPHTHALMOLOGY EXAM  09/05/2017  . URINE MICROALBUMIN  09/08/2017  . COLONOSCOPY  05/08/2023  . DEXA SCAN  Completed  . Hepatitis C Screening  Completed  . PNA vac Low Risk Adult  Completed      Plan:  I  have personally reviewed and addressed the Medicare Annual Wellness questionnaire and have noted the following in the patient's chart:  A. Medical and social history B. Use of alcohol, tobacco or illicit drugs  C. Current medications and supplements D. Functional ability and status E.  Nutritional status F.  Physical activity G. Advance directives H. List of other physicians I.  Hospitalizations, surgeries, and ER visits in previous 12 months J.  [place] such as hearing and vision if needed, cognitive and depression L. Referrals and appointments - none  In addition, I have reviewed and discussed with patient certain preventive protocols, quality metrics, and best practice recommendations. A written personalized care plan for preventive services as well as general preventive health recommendations were provided to patient.  See attached scanned questionnaire for additional information.   Signed,  Fabio Neighbors, LPN Nurse Health Advisor    MD Recommendations:  Pt declined tetanus vaccine today. Pt states she will have a mammogram done within the next year.    I have reviewed the health advisor's note, was available for consultation, and agree with documentation and plan  Lelon Huh, MD

## 2017-03-23 NOTE — Patient Instructions (Signed)
Bethany [name] , Thank you for taking time to come for your Medicare Wellness Visit. I appreciate your ongoing commitment to your health goals. Please review the following plan we discussed and let me know if I can assist you in the future.   Screening recommendations/referrals: Colonoscopy: completed 05/07/2013, due 05/2023 Mammogram: completed 11/07/2014, due now Bone Density: completed 04/16/14 Recommended yearly ophthalmology/optometry visit for glaucoma screening and checkup Recommended yearly dental visit for hygiene and checkup  Vaccinations: Influenza vaccine: up to date, due 07/2017 Pneumococcal vaccine: completed series Tdap vaccine: declined Shingles vaccine: completed per patient    Advanced directives: Please bring a copy of your POA (Power of Attorney) and/or Living Will to your next appointment.   Conditions/risks identified: Diet- recommend increasing vegetables intake in daily diet to 2 servings a day.  Next appointment: None, need to schedule follow up with PCP and 1 year year AWV.   Preventive Care 23 Years and Older, Female Preventive care refers to lifestyle choices and visits with your health care provider that can promote health and wellness. What does preventive care include?  A yearly physical exam. This is also called an annual well check.  Dental exams once or twice a year.  Routine eye exams. Ask your health care provider how often you should have your eyes checked.  Personal lifestyle choices, including:  Daily care of your teeth and gums.  Regular physical activity.  Eating a healthy diet.  Avoiding tobacco and drug use.  Limiting alcohol use.  Practicing safe sex.  Taking low-dose aspirin every day.  Taking vitamin and mineral supplements as recommended by your health care provider. What happens during an annual well check? The services and screenings done by your health care provider during your annual well check will depend on your age,  overall health, lifestyle risk factors, and family history of disease. Counseling  Your health care provider may ask you questions about your:  Alcohol use.  Tobacco use.  Drug use.  Emotional well-being.  Home and relationship well-being.  Sexual activity.  Eating habits.  History of falls.  Memory and ability to understand (cognition).  Work and work Astronomer.  Reproductive health. Screening  You may have the following tests or measurements:  Height, weight, and BMI.  Blood pressure.  Lipid and cholesterol levels. These may be checked every 5 years, or more frequently if you are over 20 years old.  Skin check.  Lung cancer screening. You may have this screening every year starting at age 56 if you have a 30-pack-year history of smoking and currently smoke or have quit within the past 15 years.  Fecal occult blood test (FOBT) of the stool. You may have this test every year starting at age 42.  Flexible sigmoidoscopy or colonoscopy. You may have a sigmoidoscopy every 5 years or a colonoscopy every 10 years starting at age 41.  Hepatitis C blood test.  Hepatitis B blood test.  Sexually transmitted disease (STD) testing.  Diabetes screening. This is done by checking your blood sugar (glucose) after you have not eaten for a while (fasting). You may have this done every 1-3 years.  Bone density scan. This is done to screen for osteoporosis. You may have this done starting at age 74.  Mammogram. This may be done every 1-2 years. Talk to your health care provider about how often you should have regular mammograms. Talk with your health care provider about your test results, treatment options, and if necessary, the need for more tests.  Vaccines  Your health care provider may recommend certain vaccines, such as:  Influenza vaccine. This is recommended every year.  Tetanus, diphtheria, and acellular pertussis (Tdap, Td) vaccine. You may need a Td booster every 10  years.  Zoster vaccine. You may need this after age 11.  Pneumococcal 13-valent conjugate (PCV13) vaccine. One dose is recommended after age 54.  Pneumococcal polysaccharide (PPSV23) vaccine. One dose is recommended after age 33. Talk to your health care provider about which screenings and vaccines you need and how often you need them. This information is not intended to replace advice given to you by your health care provider. Make sure you discuss any questions you have with your health care provider. Document Released: 11/20/2015 Document Revised: 07/13/2016 Document Reviewed: 08/25/2015 Elsevier Interactive Patient Education  2017 [place] Prevention in the Home Falls can cause injuries. They can happen to people of all ages. There are many things you can do to make your home safe and to help prevent falls. What can I do on the outside of my home?  Regularly fix the edges of walkways and driveways and fix any cracks.  Remove anything that might make you trip as you walk through a door, such as a raised step or threshold.  Trim any bushes or trees on the path to your home.  Use bright outdoor lighting.  Clear any walking paths of anything that might make someone trip, such as rocks or tools.  Regularly check to see if handrails are loose or broken. Make sure that both sides of any steps have handrails.  Any raised decks and porches should have guardrails on the edges.  Have any leaves, snow, or ice cleared regularly.  Use sand or salt on walking paths during winter.  Clean up any spills in your garage right away. This includes oil or grease spills. What can I do in the bathroom?  Use night lights.  Install grab bars by the toilet and in the tub and shower. Do not use towel bars as grab bars.  Use non-skid mats or decals in the tub or shower.  If you need to sit down in the shower, use a plastic, non-slip stool.  Keep the floor dry. Clean up any water that  spills on the floor as soon as it happens.  Remove soap buildup in the tub or shower regularly.  Attach bath mats securely with double-sided non-slip rug tape.  Do not have throw rugs and other things on the floor that can make you trip. What can I do in the bedroom?  Use night lights.  Make sure that you have a light by your bed that is easy to reach.  Do not use any sheets or blankets that are too big for your bed. They should not hang down onto the floor.  Have a firm chair that has side arms. You can use this for support while you get dressed.  Do not have throw rugs and other things on the floor that can make you trip. What can I do in the kitchen?  Clean up any spills right away.  Avoid walking on wet floors.  Keep items that you use a lot in easy-to-reach places.  If you need to reach something above you, use a strong step stool that has a grab bar.  Keep electrical cords out of the way.  Do not use floor polish or wax that makes floors slippery. If you must use wax, use non-skid floor wax.  Do not have throw rugs and other things on the floor that can make you trip. What can I do with my stairs?  Do not leave any items on the stairs.  Make sure that there are handrails on both sides of the stairs and use them. Fix handrails that are broken or loose. Make sure that handrails are as long as the stairways.  Check any carpeting to make sure that it is firmly attached to the stairs. Fix any carpet that is loose or worn.  Avoid having throw rugs at the top or bottom of the stairs. If you do have throw rugs, attach them to the floor with carpet tape.  Make sure that you have a light switch at the top of the stairs and the bottom of the stairs. If you do not have them, ask someone to add them for you. What else can I do to help prevent falls?  Wear shoes that:  Do not have high heels.  Have rubber bottoms.  Are comfortable and fit you well.  Are closed at the  toe. Do not wear sandals.  If you use a stepladder:  Make sure that it is fully opened. Do not climb a closed stepladder.  Make sure that both sides of the stepladder are locked into place.  Ask someone to hold it for you, if possible.  Clearly mark and make sure that you can see:  Any grab bars or handrails.  First and last steps.  Where the edge of each step is.  Use tools that help you move around (mobility aids) if they are needed. These include:  Canes.  Walkers.  Scooters.  Crutches.  Turn on the lights when you go into a dark area. Replace any light bulbs as soon as they burn out.  Set up your furniture so you have a clear path. Avoid moving your furniture around.  If any of your floors are uneven, fix them.  If there are any pets around you, be aware of where they are.  Review your medicines with your doctor. Some medicines can make you feel dizzy. This can increase your chance of falling. Ask your doctor what other things that you can do to help prevent falls. This information is not intended to replace advice given to you by your health care provider. Make sure you discuss any questions you have with your health care provider. Document Released: 08/20/2009 Document Revised: 03/31/2016 Document Reviewed: 11/28/2014 Elsevier Interactive Patient Education  2017 [employer].

## 2017-03-24 LAB — HEPATITIS C ANTIBODY: Hep C Virus Ab: <0.1 s/co ratio (ref 0.0–0.9)

## 2017-03-30 ENCOUNTER — Other Ambulatory Visit: Payer: Self-pay | Admitting: Family Medicine

## 2017-03-30 DIAGNOSIS — E119 Type 2 diabetes mellitus without complications: Secondary | ICD-10-CM

## 2017-03-30 DIAGNOSIS — K219 Gastro-esophageal reflux disease without esophagitis: Secondary | ICD-10-CM

## 2017-03-30 DIAGNOSIS — E039 Hypothyroidism, unspecified: Secondary | ICD-10-CM

## 2017-04-04 ENCOUNTER — Encounter: Payer: Medicare PPO | Admitting: Internal Medicine

## 2017-04-04 ENCOUNTER — Encounter: Payer: Self-pay | Admitting: Internal Medicine

## 2017-04-04 ENCOUNTER — Ambulatory Visit (INDEPENDENT_AMBULATORY_CARE_PROVIDER_SITE_OTHER): Payer: Medicare PPO | Admitting: Internal Medicine

## 2017-04-04 ENCOUNTER — Other Ambulatory Visit: Payer: Self-pay

## 2017-04-04 VITALS — BP 110/60 | Ht 61.0 in | Wt 135.0 lb

## 2017-04-04 DIAGNOSIS — I5022 Chronic systolic (congestive) heart failure: Secondary | ICD-10-CM | POA: Diagnosis not present

## 2017-04-04 DIAGNOSIS — I447 Left bundle-branch block, unspecified: Secondary | ICD-10-CM | POA: Diagnosis not present

## 2017-04-04 DIAGNOSIS — I428 Other cardiomyopathies: Secondary | ICD-10-CM

## 2017-04-04 DIAGNOSIS — Z95810 Presence of automatic (implantable) cardiac defibrillator: Secondary | ICD-10-CM | POA: Diagnosis not present

## 2017-04-04 MED ORDER — FUROSEMIDE 20 MG PO TABS: 40.0000 mg | ORAL_TABLET | ORAL | Status: DC

## 2017-04-04 MED ORDER — SPIRONOLACTONE 25 MG PO TABS: 25.0000 mg | ORAL_TABLET | Freq: Every day | ORAL | 3 refills | Status: DC

## 2017-04-04 NOTE — Progress Notes (Signed)
ELECTROPHYSIOLOGY Clinic  NOTE  Patient ID: Bethany [name], MRN: [id number], DOB/AGE: 1945/12/15 71 y.o. Admit date: (Not on file) Date of Consult: 04/04/2017  Primary Physician: Birdie Sons, MD Primary Cardiologist: BK   Chief Complaint: Defibrillator   HPI Bethany [name] is a 71 y.o. female   in follow-up for CRT-D implantation 4/16.  She has a history of a nonischemic cardiomyopathy; she is undergoing catheterization 2012 demonstrating normal coronary arteries; echocardiogram at that point demonstrated ejection fraction of 20%. (This is all obtained from reviewing records from [facility].) She also underwent echocardiogram 3/16 by Dr. Helane Gunther at [facility]. Ejection fraction was 15-20%. Repeat echocardiogram 3/17 demonstrated significant interval LV function improvement--35%  She has chronic systolic heart failure;   She's been able to tolerate guideline directed medical therapy following CRT implantation She has been started on Entresto.  Her diuretic was changed to every other day number of months ago. Once done with this her dyspnea has worsened. She has some peripheral edema. She denies nocturnal dyspnea or chest pain. There've been no cough or fever.  2/18 WBC 3.0  Hgb  11.5 Plt  95  (2/18)  K 3.5  CHF clinic notes reviewed   Past Medical History:  Diagnosis Date  . AICD (automatic cardioverter/defibrillator) present   . Anemia   . Anxiety   . CHF (congestive heart failure) ([place])   . Complication of anesthesia   . Depression   . Diabetes ([place])   . Dilated cardiomyopathy ([place])   . Diverticulosis   . Dyspnea   . Dysrhythmia   . Edema    FEET/ANKLES OCCAS  . GERD (gastroesophageal reflux disease)   . Hyperlipidemia   . Hypothyroidism   . LV dysfunction   . Orthopnea    USES 3 PILLOWS  . PONV (postoperative nausea and vomiting)   . Reflux   . Thrombocytopenia ([place])   . Thyroid disease   . VHD (valvular heart disease)       Surgical History:  Past Surgical History:    Procedure Laterality Date  . ABDOMINAL HYSTERECTOMY    . BI-VENTRICULAR IMPLANTABLE CARDIOVERTER DEFIBRILLATOR [name] 02/11/2015   Procedure: BI-VENTRICULAR IMPLANTABLE CARDIOVERTER DEFIBRILLATOR  (CRT-D);  Surgeon: Deboraha Sprang, MD;  Location: [facility] CATH LAB;  Service: Cardiovascular;  Laterality: [name];  . BONE MARROW BIOPSY  2007   spine  . CARDIAC CATHETERIZATION    . CATARACT EXTRACTION W/PHACO Left 10/27/2016   Procedure: CATARACT EXTRACTION PHACO AND INTRAOCULAR LENS PLACEMENT (IOC);  Surgeon: Leandrew Koyanagi, MD;  Location: [facility] ORS;  Service: Ophthalmology;  Laterality: Left;  [place]  01:12 AP%  21.0 CDE  11.59 Fluid pack lot # [id number]  . CATARACT EXTRACTION W/PHACO Right 01/03/2017   Procedure: CATARACT EXTRACTION PHACO AND INTRAOCULAR LENS PLACEMENT (IOC);  Surgeon: Leandrew Koyanagi, MD;  Location: [facility] ORS;  Service: Ophthalmology;  Laterality: Right;  [place] 00:50 AP% 19.5 CDE 9.94 fluid pack lot # [id number]  . CHOLECYSTECTOMY    . INSERT / REPLACE / REMOVE PACEMAKER     AICD  . LEAD REVISION [name] 02/11/2015   Procedure: LEAD REVISION;  Surgeon: Deboraha Sprang, MD;  Location: [facility] CATH LAB;  Service: Cardiovascular;  Laterality: [name];  . PARTIAL HYSTERECTOMY  1977  . TOOTH EXTRACTION  09/23/2015     Home Meds: Prior to Admission medications   Medication Sig Start Date End Date Taking? Authorizing Provider  escitalopram (LEXAPRO) 10 MG tablet Take 1 tablet by mouth daily. 01/02/15  Yes Historical Provider, MD  furosemide (LASIX) 20 MG tablet Take 1 tablet by mouth daily. 09/22/14 09/22/15 Yes Historical Provider, MD  IRON PO Take 1 tablet by mouth daily.   Yes Historical Provider, MD  levothyroxine (SYNTHROID, LEVOTHROID) 50 MCG tablet Take 1 tablet by mouth daily.   Yes Historical Provider, MD  metFORMIN (GLUCOPHAGE) 500 MG tablet Take 1 tablet by mouth 2 (two) times daily. 06/12/14  Yes Historical Provider, MD  metoprolol tartrate (LOPRESSOR) 25 MG tablet Take 12.5 mg by mouth 2 (two)  times daily.   Yes Historical Provider, MD  pantoprazole (PROTONIX) 40 MG tablet Take 1 tablet by mouth daily.   Yes Historical Provider, MD  spironolactone (ALDACTONE) 25 MG tablet Take 1 tablet by mouth 3 (three) times a week.   Yes Historical Provider, MD      Allergies:  Allergies  Allergen Reactions  . Other Other (See Comments)    NO BLOOD PRODUCTS- JEHOVAHS WITNESS  . Sulfa Antibiotics Other (See Comments)    Loss of taste    Social History   Social History  . Marital status: Divorced    Spouse name: [name]  . Number of children: 4  . Years of education: [name]   Occupational History  . retired    Social History Main Topics  . Smoking status: Never Smoker  . Smokeless tobacco: Former Systems developer    Types: Snuff    Quit date: 10/27/1969  . Alcohol use No  . Drug use: No  . Sexual activity: Not on file   Other Topics Concern  . Not on file   Social History Narrative  . No narrative on file     Family History  Problem Relation Age of Onset  . Heart disease Mother   . Heart attack Mother   . Prostate cancer Father   . Ulcers Father   . Stroke Sister   . Heart attack Sister   . Diabetes Sister   . Heart attack Brother   . Stroke Brother      ROS:  Please see the history of present illness.     All other systems reviewed and negative.    Physical Exam:   Blood pressure 110/60, height '5\' 1"'  (1.549 m), weight 135 lb (61.2 kg). Well developed and nourished in no acute distress HENT normal Neck supple with JVP-7-8 +HJR Clear Regular rate and rhythm, no murmurs or gallops Abd-soft with active BS No Clubbing cyanosis tr edema Skin-warm and dry A & Oriented  Grossly normal sensory and motor function  appropriately with a normal affect.      Labs: Cardiac Enzymes No results for input(s): CKTOTAL, CKMB, TROPONINI in the last 72 hours. CBC   Miscellaneous No results found for: DDIMER  Radiology/Studies:  No results found.  EKG:  NSR  @ 87  P  synchronous pacing  rS lead V1 rS lead 1  intervals 15/12/41  Axis 126    Assessment and Plan:   NICM  CHF systolic chronic/acute class 2B   LBBB  Hypotension/diazzinness  CRT D implanted 4/16   Dizziness is better   Blood pressures at home are also better 100-110 range  With her shortness of breath these somewhat worse and evidence of volume overload, I've asked her to take her furosemide 40 every other day as a 20 mg dose is not causing much urination.  She will need a follow-up metabolic profile given her borderline low potassium  We spent more than 50% of our >25 min visit in face to  face counseling regarding the above      Virl Axe

## 2017-04-04 NOTE — Patient Instructions (Addendum)
Medication Instructions: - Your physician has recommended you make the following change in your medication:  1) Increase lasix (furosemide) 20 mg- take 2 tablets (40 mg) by mouth once every other day  Labwork: - Your physician recommends that you return for lab work in: 2 weeks (BMP)  Procedures/Testing: - none ordered  Follow-Up: - Remote monitoring is used to monitor your Pacemaker of ICD from home. This monitoring reduces the number of office visits required to check your device to one time per year. It allows [place] to keep an eye on the functioning of your device to ensure it is working properly. You are scheduled for a device check from home on 07/04/17. You may send your transmission at any time that day. If you have a wireless device, the transmission will be sent automatically. After your physician reviews your transmission, you will receive a postcard with your next transmission date.  - Your physician wants you to follow-up in: 1 year with Dr. Graciela Husbands. You will receive a reminder letter in the mail two months in advance. If you don't receive a letter, please call our office to schedule the follow-up appointment.   Any Additional Special Instructions Will Be Listed Below (If Applicable).     If you need a refill on your cardiac medications before your next appointment, please call your pharmacy.

## 2017-04-10 ENCOUNTER — Other Ambulatory Visit: Payer: Self-pay | Admitting: Internal Medicine

## 2017-04-18 ENCOUNTER — Other Ambulatory Visit (INDEPENDENT_AMBULATORY_CARE_PROVIDER_SITE_OTHER): Payer: Medicare PPO

## 2017-04-18 DIAGNOSIS — I5022 Chronic systolic (congestive) heart failure: Secondary | ICD-10-CM

## 2017-04-18 DIAGNOSIS — I447 Left bundle-branch block, unspecified: Secondary | ICD-10-CM

## 2017-04-18 DIAGNOSIS — I428 Other cardiomyopathies: Secondary | ICD-10-CM

## 2017-04-19 LAB — BASIC METABOLIC PANEL
BUN/Creatinine Ratio: 15 (ref 12–28)
BUN: 12 mg/dL (ref 8–27)
CO2: 24 mmol/L (ref 20–29)
Calcium: 9.2 mg/dL (ref 8.7–10.3)
Chloride: 99 mmol/L (ref 96–106)
Creatinine, Ser: 0.82 mg/dL (ref 0.57–1.00)
GFR calc Af Amer: 84 mL/min/{1.73_m2} (ref >59)
GFR calc non Af Amer: 73 mL/min/{1.73_m2} (ref >59)
Glucose: 201 mg/dL — ABNORMAL HIGH (ref 65–99)
Potassium: 3.8 mmol/L (ref 3.5–5.2)
Sodium: 139 mmol/L (ref 134–144)

## 2017-04-20 ENCOUNTER — Other Ambulatory Visit: Payer: Self-pay | Admitting: Family Medicine

## 2017-04-20 DIAGNOSIS — F5101 Primary insomnia: Secondary | ICD-10-CM

## 2017-04-23 LAB — CUP PACEART INCLINIC DEVICE CHECK
Battery Remaining Longevity: 47 mo
Battery Voltage: 2.96 V
Brady Statistic AP VP Percent: 0.03 %
Brady Statistic AP VS Percent: 0.01 %
Brady Statistic AS VP Percent: 98.68 %
Brady Statistic AS VS Percent: 1.27 %
Brady Statistic RA Percent Paced: 0.04 %
Brady Statistic RV Percent Paced: 7.84 %
Date Time Interrogation Session: 20180529140117
HighPow Impedance: 54 Ohm
Implantable Lead Implant Date: 20160409
Implantable Lead Implant Date: 20160409
Implantable Lead Implant Date: 20160409
Implantable Lead Location: 753858
Implantable Lead Location: 753859
Implantable Lead Location: 753860
Implantable Lead Model: 4598
Implantable Lead Model: 5076
Implantable Pulse Generator Implant Date: 20160409
Lead Channel Impedance Value: 1007 Ohm
Lead Channel Impedance Value: 1083 Ohm
Lead Channel Impedance Value: 1140 Ohm
Lead Channel Impedance Value: 342 Ohm
Lead Channel Impedance Value: 418 Ohm
Lead Channel Impedance Value: 456 Ohm
Lead Channel Impedance Value: 456 Ohm
Lead Channel Impedance Value: 513 Ohm
Lead Channel Impedance Value: 589 Ohm
Lead Channel Impedance Value: 760 Ohm
Lead Channel Impedance Value: 836 Ohm
Lead Channel Impedance Value: 931 Ohm
Lead Channel Impedance Value: 988 Ohm
Lead Channel Pacing Threshold Amplitude: 0.5 V
Lead Channel Pacing Threshold Amplitude: 0.75 V
Lead Channel Pacing Threshold Amplitude: 1.75 V
Lead Channel Pacing Threshold Pulse Width: 0.4 ms
Lead Channel Pacing Threshold Pulse Width: 0.4 ms
Lead Channel Pacing Threshold Pulse Width: 1 ms
Lead Channel Sensing Intrinsic Amplitude: 1.75 mV
Lead Channel Sensing Intrinsic Amplitude: 12.375 mV
Lead Channel Setting Pacing Amplitude: 1.5 V
Lead Channel Setting Pacing Amplitude: 2 V
Lead Channel Setting Pacing Amplitude: 3 V
Lead Channel Setting Pacing Pulse Width: 0.4 ms
Lead Channel Setting Pacing Pulse Width: 0.8 ms
Lead Channel Setting Sensing Sensitivity: 0.3 mV

## 2017-05-12 ENCOUNTER — Encounter: Payer: Self-pay | Admitting: Family Medicine

## 2017-05-12 ENCOUNTER — Ambulatory Visit (INDEPENDENT_AMBULATORY_CARE_PROVIDER_SITE_OTHER): Payer: Medicare PPO | Admitting: Family Medicine

## 2017-05-12 VITALS — BP 90/54 | HR 76 | Temp 97.8°F | Resp 16 | Wt 134.0 lb

## 2017-05-12 DIAGNOSIS — I10 Essential (primary) hypertension: Secondary | ICD-10-CM | POA: Diagnosis not present

## 2017-05-12 DIAGNOSIS — E7800 Pure hypercholesterolemia, unspecified: Secondary | ICD-10-CM

## 2017-05-12 DIAGNOSIS — D649 Anemia, unspecified: Secondary | ICD-10-CM

## 2017-05-12 DIAGNOSIS — M810 Age-related osteoporosis without current pathological fracture: Secondary | ICD-10-CM

## 2017-05-12 DIAGNOSIS — E039 Hypothyroidism, unspecified: Secondary | ICD-10-CM | POA: Diagnosis not present

## 2017-05-12 DIAGNOSIS — Z0000 Encounter for general adult medical examination without abnormal findings: Secondary | ICD-10-CM | POA: Diagnosis not present

## 2017-05-12 DIAGNOSIS — I5022 Chronic systolic (congestive) heart failure: Secondary | ICD-10-CM | POA: Diagnosis not present

## 2017-05-12 DIAGNOSIS — E119 Type 2 diabetes mellitus without complications: Secondary | ICD-10-CM

## 2017-05-12 LAB — POCT GLYCOSYLATED HEMOGLOBIN (HGB A1C)
ESTIMATED AVERAGE GLUCOSE: 174
Hemoglobin A1C: 7.7

## 2017-05-12 NOTE — Progress Notes (Signed)
Patient: Bethany [name] Female    DOB: 1946/01/31   71 y.o.   MRN: [id number] Visit Date: 05/12/2017  Today's Provider: [name], MD   Chief Complaint  Patient presents with  . Annual Exam  . Coronary Artery Disease  . Hypertension  . Hypothyroidism  . Diabetes   Subjective:    HPI  Diabetes Mellitus Type II, Follow-up:   Lab Results  Component Value Date   HGBA1C 7.7 05/12/2017   HGBA1C 7.7 12/20/2016   HGBA1C 7.6 09/08/2016    Last seen for diabetes 5 months ago.  Management since then includes no changes. She reports good compliance with treatment. She is not having side effects.  Current symptoms include none and have been stable. Home blood sugar records: fasting range: 110-120s  Episodes of hypoglycemia? no   Current Insulin Regimen: none Most Recent Eye Exam: 4 months ago. Weight trend: stable Prior visit with dietician: no Current diet: well balanced Current exercise: no regular exercise  Pertinent Labs:    Component Value Date/Time   CHOL 190 11/13/2015 0925   CHOL 130 02/21/2014 0014   TRIG 74 11/13/2015 0925   TRIG 94 02/21/2014 0014   HDL 72 11/13/2015 0925   HDL 42 02/21/2014 0014   LDLCALC 103 (H) 11/13/2015 0925   LDLCALC 69 02/21/2014 0014   CREATININE 0.82 04/18/2017 0930   CREATININE 0.70 02/04/2015 1426    Wt Readings from Last 3 Encounters:  05/12/17 134 lb (60.8 kg)  04/04/17 135 lb (61.2 kg)  03/23/17 134 lb 9.6 oz (61.1 kg)      Hypertension, follow-up:  BP Readings from Last 3 Encounters:  05/12/17 (!) 90/54  04/04/17 110/60  03/23/17 (!) 80/58    She was last seen for hypertension 6 months ago.  BP at that visit was 92/60. Management since that visit includes no changes. She reports good compliance with treatment. She is not having side effects.  She is not exercising. She is adherent to low salt diet.   Outside blood pressures are checked daily. She reports on average her BP is 100/60s. She is  experiencing fatigue.  Patient denies lower extremity edema and palpitations.   Cardiovascular risk factors include diabetes mellitus and dyslipidemia.     Weight trend: stable Wt Readings from Last 3 Encounters:  05/12/17 134 lb (60.8 kg)  04/04/17 135 lb (61.2 kg)  03/23/17 134 lb 9.6 oz (61.1 kg)    Current diet: well balanced   CHF, follow up: Patient was last seen 5 months ago. She also has this followed by cardiology (Dr. Gwen Pounds). She reports that he changed her furosemide to 20mg  2 tablets every other day. She reports good symptom control.   She states that she thinks she took cholesterol medication years ago, but doesn't remember if she had any adverse effects.   Follow up osteoporosis.  Her last BMD was in 2015. She states she used to take medication for osteoporosis, but doesn't remember why she quit. She doesn't recall any adverse effects from prior medication.      Allergies  Allergen Reactions  . Other Other (See Comments)    NO BLOOD PRODUCTS- JEHOVAHS WITNESS  . Sulfa Antibiotics Other (See Comments)    Loss of taste     Current Outpatient Prescriptions:  .  acetaminophen (TYLENOL) 500 MG tablet, Take 1,000 mg by mouth 2 (two) times daily as needed for mild pain or moderate pain., Disp: , Rfl:  .  aspirin  EC 81 MG tablet, Take 81 mg by mouth daily., Disp: , Rfl:  .  carvedilol (COREG) 6.25 MG tablet, Take 1 tablet by mouth 2 (two) times daily., Disp: , Rfl:  .  ferrous sulfate 325 (65 FE) MG tablet, Take 325 mg by mouth 2 (two) times daily with a meal., Disp: , Rfl:  .  furosemide (LASIX) 20 MG tablet, Take 2 tablets (40 mg total) by mouth every other day., Disp: , Rfl:  .  JANUMET XR [phone] MG TB24, TAKE 1 TABLET BY MOUTH DAILY. REPLACES JANUVIA AND METFORMIN, Disp: 90 tablet, Rfl: 4 .  mirtazapine (REMERON) 7.5 MG tablet, TAKE 1 TABLET (7.5 MG TOTAL) BY MOUTH AT BEDTIME. (DOSE CHANGE), Disp: 90 tablet, Rfl: 4 .  pantoprazole (PROTONIX) 40 MG tablet, TAKE  1 TABLET EVERY DAY, Disp: 90 tablet, Rfl: 3 .  sacubitril-valsartan (ENTRESTO) 24-26 MG, Take 1 tablet by mouth 2 (two) times daily., Disp: 180 tablet, Rfl: 3 .  senna (SENOKOT) 8.6 MG TABS tablet, Take 1 tablet by mouth daily as needed for mild constipation., Disp: , Rfl:  .  spironolactone (ALDACTONE) 25 MG tablet, Take 1 tablet (25 mg total) by mouth daily., Disp: 90 tablet, Rfl: 3 .  SYNTHROID 50 MCG tablet, TAKE 1 TABLET EVERY DAY, Disp: 90 tablet, Rfl: 3 .  TRUE METRIX BLOOD GLUCOSE TEST test strip, CHECK BLOOD SUGAR EVERY DAY, Disp: 100 each, Rfl: 5 .  TRUEPLUS LANCETS 33G MISC, CHECK BLOOD SUGAR ONE TIME A DAY  , Disp: 100 each, Rfl: 4 .  Alcohol Swabs (B-D SINGLE USE SWABS REGULAR) PADS, CHECK BLOOD SUGAR DAILY, Disp: 100 each, Rfl: 2  Review of Systems  Constitutional: Positive for activity change and fatigue.  Respiratory: Negative.   Cardiovascular: Negative for chest pain, palpitations and leg swelling.  Endocrine: Negative.   Musculoskeletal: Negative.   Neurological: Positive for headaches.       Has occasional headaches  Psychiatric/Behavioral: Negative.     Social History  Substance Use Topics  . Smoking status: Never Smoker  . Smokeless tobacco: Former Neurosurgeon    Types: Snuff    Quit date: 10/27/1969  . Alcohol use No   Objective:   BP (!) 90/54 (BP Location: Left Arm, Patient Position: Sitting, Cuff Size: Normal)   Pulse 76   Temp 97.8 F (36.6 C)   Resp 16   Wt 134 lb (60.8 kg)   LMP  (LMP Unknown)   SpO2 99%   BMI 25.32 kg/m  Vitals:   05/12/17 0930  BP: (!) 90/54  Pulse: 76  Resp: 16  Temp: 97.8 F (36.6 C)  SpO2: 99%  Weight: 134 lb (60.8 kg)     Physical Exam  Constitutional: She is oriented to person, place, and time. She appears well-developed and well-nourished.  HENT:  Head: Normocephalic and atraumatic.  Right Ear: External ear normal.  Left Ear: External ear normal.  Nose: Nose normal.  Mouth/Throat: Oropharynx is clear and moist.    Eyes: Conjunctivae and EOM are normal. Pupils are equal, round, and reactive to light.  Cardiovascular: Normal rate, regular rhythm, normal heart sounds and intact distal pulses.   Pulmonary/Chest: Effort normal and breath sounds normal. Right breast exhibits no inverted nipple, no mass, no nipple discharge, no skin change and no tenderness. Left breast exhibits no inverted nipple, no mass, no nipple discharge, no skin change and no tenderness. Breasts are symmetrical.  Abdominal: Soft. Bowel sounds are normal.  Musculoskeletal: Normal range of motion.  Neurological:  She is alert and oriented to person, place, and time.  Skin: Skin is warm and dry.  Psychiatric: She has a normal mood and affect. Her behavior is normal. Judgment and thought content normal.   Results for orders placed or performed in visit on 05/12/17  POCT glycosylated hemoglobin (Hb A1C)  Result Value Ref Range   Hemoglobin A1C 7.7    Est. average glucose Bld gHb Est-mCnc 174         Assessment & Plan:     1. Annual physical exam She is to call [place] to schedule screening mammogram.   2. Chronic systolic heart failure (HCC) Continue routine follow up with cardiology.   3. Benign essential HTN BP low today, but she is tolerating well.   4. Type 2 diabetes mellitus without complication, without long-term current use of insulin (HCC) Stable a1c, Continue current medications.   - POCT glycosylated hemoglobin (Hb A1C)  5. Adult hypothyroidism  - T4 AND TSH  6. Hypercholesteremia Not currently on statin, will review records to see if there is any history of adverse reaction to statins.  - Lipid panel - Comprehensive metabolic panel  7. Age-related osteoporosis without current pathological fracture Review records to see any history of adverse reaction to bisphosphonate's.  - DG Bone Density; Future - VITAMIN D 25 Hydroxy (Vit-D Deficiency, Fractures)  8. Anemia, unspecified type  - CBC - Ferritin        [name], MD  [facility]

## 2017-05-13 LAB — COMPREHENSIVE METABOLIC PANEL
ALBUMIN: 4.3 g/dL (ref 3.5–4.8)
ALK PHOS: 80 IU/L (ref 39–117)
ALT: 8 IU/L (ref 0–32)
AST: 14 IU/L (ref 0–40)
Albumin/Globulin Ratio: 1.5 (ref 1.2–2.2)
BILIRUBIN TOTAL: 0.3 mg/dL (ref 0.0–1.2)
BUN/Creatinine Ratio: 12 (ref 12–28)
BUN: 12 mg/dL (ref 8–27)
CHLORIDE: 100 mmol/L (ref 96–106)
CO2: 25 mmol/L (ref 20–29)
Calcium: 9.7 mg/dL (ref 8.7–10.3)
Creatinine, Ser: 0.97 mg/dL (ref 0.57–1.00)
GFR calc Af Amer: 68 mL/min/{1.73_m2} (ref >59)
GFR calc non Af Amer: 59 mL/min/{1.73_m2} — ABNORMAL LOW (ref >59)
GLOBULIN, TOTAL: 2.8 g/dL (ref 1.5–4.5)
Glucose: 200 mg/dL — ABNORMAL HIGH (ref 65–99)
Potassium: 4.3 mmol/L (ref 3.5–5.2)
SODIUM: 141 mmol/L (ref 134–144)
Total Protein: 7.1 g/dL (ref 6.0–8.5)

## 2017-05-13 LAB — FERRITIN: FERRITIN: 230 ng/mL — AB (ref 15–150)

## 2017-05-13 LAB — T4 AND TSH
T4 TOTAL: 10.1 ug/dL (ref 4.5–12.0)
TSH: 2.68 u[IU]/mL (ref 0.450–4.500)

## 2017-05-13 LAB — LIPID PANEL
Chol/HDL Ratio: 3.6 ratio (ref 0.0–4.4)
Cholesterol, Total: 188 mg/dL (ref 100–199)
HDL: 52 mg/dL (ref >39)
LDL CALC: 114 mg/dL — AB (ref 0–99)
TRIGLYCERIDES: 110 mg/dL (ref 0–149)
VLDL Cholesterol Cal: 22 mg/dL (ref 5–40)

## 2017-05-13 LAB — CBC
Hematocrit: 35.5 % (ref 34.0–46.6)
Hemoglobin: 11.7 g/dL (ref 11.1–15.9)
MCH: 29 pg (ref 26.6–33.0)
MCHC: 33 g/dL (ref 31.5–35.7)
MCV: 88 fL (ref 79–97)
Platelets: 133 10*3/uL — ABNORMAL LOW (ref 150–379)
RBC: 4.04 x10E6/uL (ref 3.77–5.28)
RDW: 13.5 % (ref 12.3–15.4)
WBC: 3.3 10*3/uL — AB (ref 3.4–10.8)

## 2017-05-13 LAB — VITAMIN D 25 HYDROXY (VIT D DEFICIENCY, FRACTURES): Vit D, 25-Hydroxy: 20.7 ng/mL — ABNORMAL LOW (ref 30.0–100.0)

## 2017-05-14 ENCOUNTER — Encounter: Payer: Self-pay | Admitting: Family Medicine

## 2017-05-16 ENCOUNTER — Telehealth: Payer: Self-pay

## 2017-05-16 DIAGNOSIS — E559 Vitamin D deficiency, unspecified: Secondary | ICD-10-CM

## 2017-05-16 MED ORDER — VITAMIN D (ERGOCALCIFEROL) 1.25 MG (50000 UNIT) PO CAPS: 50000.0000 [IU] | ORAL_CAPSULE | ORAL | 3 refills | Status: DC

## 2017-05-16 NOTE — Telephone Encounter (Signed)
-----   Message from Malva Limes, MD sent at 05/14/2017  9:02 AM EDT ----- Vitamin d levels are very low. Need to start vitamin d 50,000 units once a week, #12, rf x 3. Rest of labs are good follow up for diabetes 4 months.

## 2017-05-16 NOTE — Telephone Encounter (Signed)
Advised patient of results. Medication was sent into the pharmacy as below.

## 2017-06-08 ENCOUNTER — Other Ambulatory Visit: Payer: Self-pay | Admitting: Family Medicine

## 2017-06-08 DIAGNOSIS — Z1231 Encounter for screening mammogram for malignant neoplasm of breast: Secondary | ICD-10-CM

## 2017-06-09 ENCOUNTER — Other Ambulatory Visit: Payer: Self-pay | Admitting: Family Medicine

## 2017-06-09 NOTE — Telephone Encounter (Signed)
Please advise 

## 2017-06-09 NOTE — Telephone Encounter (Signed)
She only takes it once every 7 days, it will be fine to wait until mail order arrives.

## 2017-06-09 NOTE — Telephone Encounter (Signed)
Pt states she would like to see if Dr. Sherrie Mustache would call in 10 days worth of Vit D into [facility] on [address]  States the RX that was called into mail order will not be delivered until 7 days and she is out.

## 2017-06-09 NOTE — Telephone Encounter (Signed)
Patient was notified.

## 2017-06-27 ENCOUNTER — Telehealth: Payer: Self-pay

## 2017-06-27 NOTE — Telephone Encounter (Signed)
Patient called stating that she has been short of breath since yesterday. Shortness of breath worsens when she lays down or when she climbs steps. She also feels tired and has cold chills. Her blood pressure has been running low around 92/64.Paient was unsure if she should contact her Cardiologist or her PCP.  Patient denies any fever, chest pain or wheezing.I consulted with Dr. Sherrie Mustache who recommends that patient go to the ER for evaluation due to her history of heart failure. I also advised patient that she may also try contacting her Cardiologist to see if they agree with going to the ER at this point.  Patient verbally voiced understanding and agrees to go to the ER.

## 2017-07-03 ENCOUNTER — Other Ambulatory Visit: Payer: Self-pay | Admitting: Family Medicine

## 2017-07-03 DIAGNOSIS — E119 Type 2 diabetes mellitus without complications: Secondary | ICD-10-CM

## 2017-07-04 ENCOUNTER — Ambulatory Visit (INDEPENDENT_AMBULATORY_CARE_PROVIDER_SITE_OTHER): Payer: Medicare PPO | Admitting: *Deleted

## 2017-07-04 ENCOUNTER — Ambulatory Visit
Admission: RE | Admit: 2017-07-04 | Discharge: 2017-07-04 | Disposition: A | Discharge status: Home / Self Care | Payer: Medicare PPO | Source: Ambulatory Visit | Attending: Family Medicine | Admitting: Family Medicine

## 2017-07-04 DIAGNOSIS — I428 Other cardiomyopathies: Secondary | ICD-10-CM | POA: Diagnosis not present

## 2017-07-04 DIAGNOSIS — Z1231 Encounter for screening mammogram for malignant neoplasm of breast: Secondary | ICD-10-CM | POA: Diagnosis not present

## 2017-07-04 DIAGNOSIS — I5022 Chronic systolic (congestive) heart failure: Secondary | ICD-10-CM

## 2017-07-04 DIAGNOSIS — E2839 Other primary ovarian failure: Secondary | ICD-10-CM | POA: Insufficient documentation

## 2017-07-04 DIAGNOSIS — M85851 Other specified disorders of bone density and structure, right thigh: Secondary | ICD-10-CM | POA: Diagnosis not present

## 2017-07-04 DIAGNOSIS — M810 Age-related osteoporosis without current pathological fracture: Secondary | ICD-10-CM

## 2017-07-04 NOTE — Progress Notes (Signed)
Remote ICD transmission.   

## 2017-07-05 LAB — CUP PACEART REMOTE DEVICE CHECK
Battery Voltage: 2.97 V
Brady Statistic AP VP Percent: 0.03 %
Brady Statistic AP VS Percent: 0.01 %
Brady Statistic AS VP Percent: 98.7 %
Brady Statistic AS VS Percent: 1.25 %
Brady Statistic RV Percent Paced: 7.06 %
Date Time Interrogation Session: 20180828083824
HIGH POWER IMPEDANCE MEASURED VALUE: 55 Ohm
Implantable Lead Implant Date: 20160409
Implantable Lead Location: 753858
Implantable Lead Location: 753860
Implantable Lead Model: 4598
Implantable Lead Model: 5076
Implantable Pulse Generator Implant Date: 20160409
Lead Channel Impedance Value: 1026 Ohm
Lead Channel Impedance Value: 1083 Ohm
Lead Channel Impedance Value: 399 Ohm
Lead Channel Impedance Value: 418 Ohm
Lead Channel Impedance Value: 532 Ohm
Lead Channel Impedance Value: 760 Ohm
Lead Channel Pacing Threshold Amplitude: 0.625 V
Lead Channel Pacing Threshold Amplitude: 1.875 V
Lead Channel Pacing Threshold Pulse Width: 0.4 ms
Lead Channel Pacing Threshold Pulse Width: 0.4 ms
Lead Channel Pacing Threshold Pulse Width: 0.8 ms
Lead Channel Sensing Intrinsic Amplitude: 1.5 mV
Lead Channel Sensing Intrinsic Amplitude: 10.125 mV
Lead Channel Setting Pacing Pulse Width: 0.8 ms
Lead Channel Setting Sensing Sensitivity: 0.3 mV
MDC IDC LEAD IMPLANT DT: 20160409
MDC IDC LEAD IMPLANT DT: 20160409
MDC IDC LEAD LOCATION: 753859
MDC IDC MSMT BATTERY REMAINING LONGEVITY: 54 mo
MDC IDC MSMT LEADCHNL LV IMPEDANCE VALUE: 1026 Ohm
MDC IDC MSMT LEADCHNL LV IMPEDANCE VALUE: 1178 Ohm
MDC IDC MSMT LEADCHNL LV IMPEDANCE VALUE: 608 Ohm
MDC IDC MSMT LEADCHNL LV IMPEDANCE VALUE: 836 Ohm
MDC IDC MSMT LEADCHNL LV IMPEDANCE VALUE: 950 Ohm
MDC IDC MSMT LEADCHNL RA PACING THRESHOLD AMPLITUDE: 0.375 V
MDC IDC MSMT LEADCHNL RA SENSING INTR AMPL: 1.5 mV
MDC IDC MSMT LEADCHNL RV IMPEDANCE VALUE: 399 Ohm
MDC IDC MSMT LEADCHNL RV IMPEDANCE VALUE: 475 Ohm
MDC IDC MSMT LEADCHNL RV SENSING INTR AMPL: 10.125 mV
MDC IDC SET LEADCHNL LV PACING AMPLITUDE: 2.5 V
MDC IDC SET LEADCHNL RA PACING AMPLITUDE: 1.5 V
MDC IDC SET LEADCHNL RV PACING AMPLITUDE: 2 V
MDC IDC SET LEADCHNL RV PACING PULSEWIDTH: 0.4 ms
MDC IDC STAT BRADY RA PERCENT PACED: 0.05 %

## 2017-07-12 ENCOUNTER — Encounter: Payer: Self-pay | Admitting: Family

## 2017-07-12 ENCOUNTER — Ambulatory Visit: Payer: Medicare PPO | Attending: Family | Admitting: Family

## 2017-07-12 VITALS — BP 102/59 | HR 78 | Resp 18 | Ht 61.0 in | Wt 132.0 lb

## 2017-07-12 DIAGNOSIS — Z90711 Acquired absence of uterus with remaining cervical stump: Secondary | ICD-10-CM | POA: Diagnosis not present

## 2017-07-12 DIAGNOSIS — Z9889 Other specified postprocedural states: Secondary | ICD-10-CM | POA: Diagnosis not present

## 2017-07-12 DIAGNOSIS — I420 Dilated cardiomyopathy: Secondary | ICD-10-CM | POA: Insufficient documentation

## 2017-07-12 DIAGNOSIS — E119 Type 2 diabetes mellitus without complications: Secondary | ICD-10-CM | POA: Diagnosis not present

## 2017-07-12 DIAGNOSIS — Z9842 Cataract extraction status, left eye: Secondary | ICD-10-CM | POA: Diagnosis not present

## 2017-07-12 DIAGNOSIS — I5022 Chronic systolic (congestive) heart failure: Secondary | ICD-10-CM | POA: Insufficient documentation

## 2017-07-12 DIAGNOSIS — I959 Hypotension, unspecified: Secondary | ICD-10-CM | POA: Insufficient documentation

## 2017-07-12 DIAGNOSIS — Z8249 Family history of ischemic heart disease and other diseases of the circulatory system: Secondary | ICD-10-CM | POA: Insufficient documentation

## 2017-07-12 DIAGNOSIS — Z888 Allergy status to other drugs, medicaments and biological substances status: Secondary | ICD-10-CM | POA: Diagnosis not present

## 2017-07-12 DIAGNOSIS — E785 Hyperlipidemia, unspecified: Secondary | ICD-10-CM | POA: Diagnosis not present

## 2017-07-12 DIAGNOSIS — E039 Hypothyroidism, unspecified: Secondary | ICD-10-CM | POA: Diagnosis not present

## 2017-07-12 DIAGNOSIS — Z8042 Family history of malignant neoplasm of prostate: Secondary | ICD-10-CM | POA: Diagnosis not present

## 2017-07-12 DIAGNOSIS — Z823 Family history of stroke: Secondary | ICD-10-CM | POA: Insufficient documentation

## 2017-07-12 DIAGNOSIS — Z882 Allergy status to sulfonamides status: Secondary | ICD-10-CM | POA: Insufficient documentation

## 2017-07-12 DIAGNOSIS — Z9841 Cataract extraction status, right eye: Secondary | ICD-10-CM | POA: Diagnosis not present

## 2017-07-12 DIAGNOSIS — D649 Anemia, unspecified: Secondary | ICD-10-CM | POA: Diagnosis not present

## 2017-07-12 DIAGNOSIS — Z9049 Acquired absence of other specified parts of digestive tract: Secondary | ICD-10-CM | POA: Diagnosis not present

## 2017-07-12 DIAGNOSIS — Z95810 Presence of automatic (implantable) cardiac defibrillator: Secondary | ICD-10-CM | POA: Diagnosis not present

## 2017-07-12 DIAGNOSIS — I950 Idiopathic hypotension: Secondary | ICD-10-CM

## 2017-07-12 NOTE — Progress Notes (Signed)
Patient ID: Bethany [name], female    DOB: 1946/05/25, 71 y.o.   MRN: [id number]  HPI  Bethany [name] is a 71 y/o female with a history of valvular heart disease, thyroid disease, thrombocytopenia, hyperlipidemia, GERD, dysrhythmia (AICD), DM, depression, anxiety, anemia, remote tobacco use and chronic heart failure.   Last echo was done 02/05/16 and showed an EF of 35% along with mild AR/ MR/ TR. EF has improved from 15% in March 2016. Had a cardiac catheterization in 2015.  Was in the ED on 12/17/16 with URI symptoms. Was evaluated and discharged home. Was in the ED on 07/30/16 with left arm and neck pain. Troponins were negative. She was treated and discharged home.     She presents today for a follow-up visit with a chief complaint of mild shortness of breath upon moderate exertion. She says that this is chronic in nature having been present for several years with varying levels of severity. She does notice that her breathing is worse when going up steps. She has associated fatigue, cough, intermittent chest pain and light-headedness along with this. She denies weight gain or edema.   Past Medical History:  Diagnosis Date  . AICD (automatic cardioverter/defibrillator) present   . Anemia   . Anxiety   . CHF (congestive heart failure) ([place])   . Complication of anesthesia   . Depression   . Diabetes ([place])   . Dilated cardiomyopathy ([place])   . Diverticulosis   . Dyspnea   . Dysrhythmia   . Edema    FEET/ANKLES OCCAS  . GERD (gastroesophageal reflux disease)   . Hyperlipidemia   . Hypothyroidism   . LV dysfunction   . Orthopnea    USES 3 PILLOWS  . PONV (postoperative nausea and vomiting)   . Reflux   . Thrombocytopenia ([place])   . Thyroid disease   . VHD (valvular heart disease)    Past Surgical History:  Procedure Laterality Date  . ABDOMINAL HYSTERECTOMY    . BI-VENTRICULAR IMPLANTABLE CARDIOVERTER DEFIBRILLATOR [name] 02/11/2015   Procedure: BI-VENTRICULAR IMPLANTABLE CARDIOVERTER  DEFIBRILLATOR  (CRT-D);  Surgeon: Deboraha Sprang, MD;  Location: [facility] CATH LAB;  Service: Cardiovascular;  Laterality: [name];  . BONE MARROW BIOPSY  2007   spine  . CARDIAC CATHETERIZATION    . CATARACT EXTRACTION W/PHACO Left 10/27/2016   Procedure: CATARACT EXTRACTION PHACO AND INTRAOCULAR LENS PLACEMENT (IOC);  Surgeon: Leandrew Koyanagi, MD;  Location: [facility] ORS;  Service: Ophthalmology;  Laterality: Left;  [place]  01:12 AP%  21.0 CDE  11.59 Fluid pack lot # [id number]  . CATARACT EXTRACTION W/PHACO Right 01/03/2017   Procedure: CATARACT EXTRACTION PHACO AND INTRAOCULAR LENS PLACEMENT (IOC);  Surgeon: Leandrew Koyanagi, MD;  Location: [facility] ORS;  Service: Ophthalmology;  Laterality: Right;  [place] 00:50 AP% 19.5 CDE 9.94 fluid pack lot # [id number]  . CHOLECYSTECTOMY    . INSERT / REPLACE / REMOVE PACEMAKER     AICD  . LEAD REVISION [name] 02/11/2015   Procedure: LEAD REVISION;  Surgeon: Deboraha Sprang, MD;  Location: [facility] CATH LAB;  Service: Cardiovascular;  Laterality: [name];  . PARTIAL HYSTERECTOMY  1977  . TOOTH EXTRACTION  09/23/2015   Family History  Problem Relation Age of Onset  . Heart disease Mother   . Heart attack Mother   . Prostate cancer Father   . Ulcers Father   . Stroke Sister   . Heart attack Sister   . Diabetes Sister   . Heart attack Brother   . Stroke  Brother   . Breast cancer Neg Hx    Social History  Substance Use Topics  . Smoking status: Never Smoker  . Smokeless tobacco: Former Systems developer    Types: Snuff    Quit date: 10/27/1969  . Alcohol use No   Allergies  Allergen Reactions  . Other Other (See Comments)    NO BLOOD PRODUCTS- JEHOVAHS WITNESS  . Sulfa Antibiotics Other (See Comments)    Loss of taste    Review of Systems  Constitutional: Positive for fatigue. Negative for appetite change.  HENT: Negative for congestion, postnasal drip and sore throat.   Eyes: Negative.   Respiratory: Positive for cough and shortness of breath (when going up steps). Negative  for chest tightness.   Cardiovascular: Positive for chest pain (infrequent). Negative for palpitations and leg swelling.  Gastrointestinal: Positive for diarrhea (at times). Negative for abdominal distention and abdominal pain.  Endocrine: Negative.   Genitourinary: Negative.   Musculoskeletal: Positive for neck stiffness (thinks she picked up something heavy). Negative for back pain and neck pain.  Skin: Negative.   Allergic/Immunologic: Negative.   Neurological: Positive for light-headedness. Negative for dizziness.  Hematological: Negative for adenopathy. Does not bruise/bleed easily.  Psychiatric/Behavioral: Negative for dysphoric mood and sleep disturbance (sleeping on 2-3 pillows). The patient is not nervous/anxious.    Vitals:   07/12/17 1028  BP: (!) 102/59  Pulse: 78  Resp: 18  SpO2: 100%  Weight: 132 lb (59.9 kg)  Height: '5\' 1"'  (1.549 m)   Wt Readings from Last 3 Encounters:  07/12/17 132 lb (59.9 kg)  05/12/17 134 lb (60.8 kg)  04/04/17 135 lb (61.2 kg)    Lab Results  Component Value Date   CREATININE 0.97 05/12/2017   CREATININE 0.82 04/18/2017   CREATININE 0.64 12/17/2016    Physical Exam  Constitutional: She is oriented to person, place, and time. She appears well-developed and well-nourished.  HENT:  Head: Normocephalic and atraumatic.  Neck: Normal range of motion. Neck supple. No JVD present.  Cardiovascular: Normal rate and regular rhythm.   Pulmonary/Chest: Effort normal. She has no wheezes. She has no rales.  Abdominal: Soft. She exhibits no distension. There is no tenderness.  Musculoskeletal: She exhibits no edema or tenderness.  Neurological: She is alert and oriented to person, place, and time.  Skin: Skin is warm and dry.  Psychiatric: She has a normal mood and affect. Her behavior is normal. Thought content normal.  Nursing note and vitals reviewed.   Assessment & Plan:    1: Chronic heart failure with reduced ejection fraction-  - NYHA  class III - euvolemic - weighing daily. Reminded to call for an overnight weight gain of >2 pounds or a weekly weight gain of >5 pounds - on low dose entresto  - diuretic is now being taken every other day - not adding salt to her food - saw cardiologist Nehemiah Massed) on 04/10/17 and returns to him October 2018 - looks like per cardiology note that an echo was ordered but has not been done yet - no firings from her AICD - PharmD went in and reviewed medications with the patient  2: Hypotension-  - BP on the low side so unable to titrate up entresto or carvedilol - occasional lightheadedness - encouraged slow position changes  3: Diabetes-  - glucose was 118 fasting at home - saw PCP Caryn Section) on 05/12/17 - BMP from 05/12/17 reviewed and shows potassium 4.3 and GFR 68 - A1c from 05/12/17 is 7.7%  Medication list was reviewed.   Return in 6 months or sooner for any questions/problems before then.

## 2017-07-12 NOTE — Patient Instructions (Signed)
Continue weighing daily and call for an overnight weight gain of > 2 pounds or a weekly weight gain of >5 pounds. 

## 2017-07-14 ENCOUNTER — Encounter: Payer: Self-pay | Admitting: Cardiology

## 2017-07-14 NOTE — Progress Notes (Signed)
Letter  

## 2017-07-24 DIAGNOSIS — I5022 Chronic systolic (congestive) heart failure: Secondary | ICD-10-CM | POA: Diagnosis not present

## 2017-08-07 DIAGNOSIS — I10 Essential (primary) hypertension: Secondary | ICD-10-CM | POA: Diagnosis not present

## 2017-08-07 DIAGNOSIS — Z95810 Presence of automatic (implantable) cardiac defibrillator: Secondary | ICD-10-CM | POA: Diagnosis not present

## 2017-08-07 DIAGNOSIS — I447 Left bundle-branch block, unspecified: Secondary | ICD-10-CM | POA: Diagnosis not present

## 2017-08-07 DIAGNOSIS — I5022 Chronic systolic (congestive) heart failure: Secondary | ICD-10-CM | POA: Diagnosis not present

## 2017-09-14 ENCOUNTER — Emergency Department
Admission: EM | Admit: 2017-09-14 | Discharge: 2017-09-14 | Disposition: A | Discharge status: Home / Self Care | Payer: Medicare PPO | Attending: Emergency Medicine | Admitting: Emergency Medicine

## 2017-09-14 ENCOUNTER — Other Ambulatory Visit: Payer: Self-pay

## 2017-09-14 ENCOUNTER — Telehealth: Payer: Self-pay

## 2017-09-14 ENCOUNTER — Ambulatory Visit: Payer: Self-pay | Admitting: Family Medicine

## 2017-09-14 ENCOUNTER — Encounter: Payer: Self-pay | Admitting: Emergency Medicine

## 2017-09-14 ENCOUNTER — Emergency Department: Payer: Medicare PPO

## 2017-09-14 DIAGNOSIS — Z79899 Other long term (current) drug therapy: Secondary | ICD-10-CM | POA: Insufficient documentation

## 2017-09-14 DIAGNOSIS — E119 Type 2 diabetes mellitus without complications: Secondary | ICD-10-CM | POA: Diagnosis not present

## 2017-09-14 DIAGNOSIS — E039 Hypothyroidism, unspecified: Secondary | ICD-10-CM | POA: Insufficient documentation

## 2017-09-14 DIAGNOSIS — Z7982 Long term (current) use of aspirin: Secondary | ICD-10-CM | POA: Insufficient documentation

## 2017-09-14 DIAGNOSIS — Z95810 Presence of automatic (implantable) cardiac defibrillator: Secondary | ICD-10-CM | POA: Insufficient documentation

## 2017-09-14 DIAGNOSIS — R079 Chest pain, unspecified: Secondary | ICD-10-CM

## 2017-09-14 DIAGNOSIS — I509 Heart failure, unspecified: Secondary | ICD-10-CM | POA: Diagnosis not present

## 2017-09-14 DIAGNOSIS — Z7984 Long term (current) use of oral hypoglycemic drugs: Secondary | ICD-10-CM | POA: Diagnosis not present

## 2017-09-14 DIAGNOSIS — R0602 Shortness of breath: Secondary | ICD-10-CM | POA: Diagnosis not present

## 2017-09-14 DIAGNOSIS — I110 Hypertensive heart disease with heart failure: Secondary | ICD-10-CM | POA: Insufficient documentation

## 2017-09-14 DIAGNOSIS — Z87891 Personal history of nicotine dependence: Secondary | ICD-10-CM | POA: Insufficient documentation

## 2017-09-14 LAB — BASIC METABOLIC PANEL
ANION GAP: 9 (ref 5–15)
BUN: 10 mg/dL (ref 6–20)
CALCIUM: 9.6 mg/dL (ref 8.9–10.3)
CO2: 27 mmol/L (ref 22–32)
CREATININE: 0.75 mg/dL (ref 0.44–1.00)
Chloride: 100 mmol/L — ABNORMAL LOW (ref 101–111)
GFR calc Af Amer: >60 mL/min (ref >60)
GFR calc non Af Amer: >60 mL/min (ref >60)
GLUCOSE: 259 mg/dL — AB (ref 65–99)
Potassium: 3.9 mmol/L (ref 3.5–5.1)
Sodium: 136 mmol/L (ref 135–145)

## 2017-09-14 LAB — CBC
HCT: 33.5 % — ABNORMAL LOW (ref 35.0–47.0)
Hemoglobin: 11 g/dL — ABNORMAL LOW (ref 12.0–16.0)
MCH: 29.5 pg (ref 26.0–34.0)
MCHC: 32.8 g/dL (ref 32.0–36.0)
MCV: 89.8 fL (ref 80.0–100.0)
Platelets: 112 10*3/uL — ABNORMAL LOW (ref 150–440)
RBC: 3.74 MIL/uL — ABNORMAL LOW (ref 3.80–5.20)
RDW: 13.5 % (ref 11.5–14.5)
WBC: 3.2 10*3/uL — ABNORMAL LOW (ref 3.6–11.0)

## 2017-09-14 LAB — TROPONIN I

## 2017-09-14 NOTE — ED Provider Notes (Addendum)
[facility] Emergency Department Provider Note  Time seen: 11:39 AM  I have reviewed the triage vital signs and the nursing notes.   HISTORY  Chief Complaint Chest Pain    HPI Bethany [name] is a 71 y.o. female with a past medical history of anemia, anxiety, CHF, depression, diabetes, hyperlipidemia, presents to the emergency department for chest pain.  According to the patient over the past 2 weeks she has intermittently been experiencing chest pain in the left chest.  Denies any shortness of breath nausea or diaphoresis.  Denies any leg pain or swelling.  States the pain when it does occur is mild and dull.  However the pain will come and go, mostly occurs at night when lying flat.  But denies any shortness of breath.  Currently denies any chest pain is lying in bed comfortably.  Past Medical History:  Diagnosis Date  . AICD (automatic cardioverter/defibrillator) present   . Anemia   . Anxiety   . CHF (congestive heart failure) ([place])   . Complication of anesthesia   . Depression   . Diabetes ([place])   . Dilated cardiomyopathy ([place])   . Diverticulosis   . Dyspnea   . Dysrhythmia   . Edema    FEET/ANKLES OCCAS  . GERD (gastroesophageal reflux disease)   . Hyperlipidemia   . Hypothyroidism   . LV dysfunction   . Orthopnea    USES 3 PILLOWS  . PONV (postoperative nausea and vomiting)   . Reflux   . Thrombocytopenia ([place])   . Thyroid disease   . VHD (valvular heart disease)     Patient Active Problem List   Diagnosis Date Noted  . Memory loss 07/12/2016  . GERD (gastroesophageal reflux disease) 08/24/2015  . Automatic implantable cardioverter-defibrillator in situ 07/29/2015  . Hypotension 06/18/2015  . Benign essential HTN 03/20/2015  . Absolute anemia 03/11/2015  . Abnormal liver enzymes 03/11/2015  . Decreased potassium in the blood 03/11/2015  . Hypercholesteremia 03/11/2015  . Adult hypothyroidism 03/11/2015  . Idiopathic insomnia  03/11/2015  . OP (osteoporosis) 03/11/2015  . Diabetes mellitus, type 2 ([place]) 03/11/2015  . Vitamin D deficiency 03/11/2015  . LBBB (left bundle branch block) 02/11/2015  . Paroxysmal supraventricular tachycardia ([place]) 11/25/2014  . Chronic systolic heart failure ([place]) 09/25/2014  . Heart valve disease 04/10/2014    Past Surgical History:  Procedure Laterality Date  . ABDOMINAL HYSTERECTOMY    . BONE MARROW BIOPSY  2007   spine  . CARDIAC CATHETERIZATION    . CHOLECYSTECTOMY    . INSERT / REPLACE / REMOVE PACEMAKER     AICD  . PARTIAL HYSTERECTOMY  1977  . TOOTH EXTRACTION  09/23/2015    Prior to Admission medications   Medication Sig Start Date End Date Taking? Authorizing Provider  acetaminophen (TYLENOL) 500 MG tablet Take 1,000 mg by mouth 2 (two) times daily as needed for mild pain or moderate pain.    [provider]  Alcohol Swabs (B-D SINGLE USE SWABS REGULAR) PADS CHECK BLOOD SUGAR DAILY 03/06/17   Birdie Sons, MD  aspirin EC 81 MG tablet Take 81 mg by mouth daily.    [provider]  carvedilol (COREG) 6.25 MG tablet Take 1 tablet by mouth 2 (two) times daily. 03/26/15   [provider]  ferrous sulfate 325 (65 FE) MG tablet Take 325 mg by mouth 2 (two) times daily with a meal.    [provider]  furosemide (LASIX) 40 MG tablet  Take 40 mg by mouth daily.    [provider]  JANUMET XR [phone] MG TB24 TAKE 1 TABLET BY MOUTH DAILY. REPLACES JANUVIA AND METFORMIN 04/20/17   Birdie Sons, MD  mirtazapine (REMERON) 7.5 MG tablet TAKE 1 TABLET (7.5 MG TOTAL) BY MOUTH AT BEDTIME. (DOSE CHANGE) 04/20/17   Birdie Sons, MD  pantoprazole (PROTONIX) 40 MG tablet TAKE 1 TABLET EVERY DAY 03/30/17   Birdie Sons, MD  sacubitril-valsartan (ENTRESTO) 24-26 MG Take 1 tablet by mouth 2 (two) times daily. 03/13/17   Alisa Graff, FNP  senna (SENOKOT) 8.6 MG TABS tablet Take 1 tablet by mouth daily as needed for mild constipation.     [provider]  spironolactone (ALDACTONE) 25 MG tablet Take 1 tablet (25 mg total) by mouth daily. 04/04/17   Alisa Graff, FNP  SYNTHROID 50 MCG tablet TAKE 1 TABLET EVERY DAY 03/30/17   Birdie Sons, MD  TRUE METRIX BLOOD GLUCOSE TEST test strip CHECK BLOOD SUGAR EVERY DAY 03/30/17   Birdie Sons, MD  TRUEPLUS LANCETS 33G MISC CHECK BLOOD SUGAR ONE TIME A DAY 07/03/17   Birdie Sons, MD  Vitamin D, Ergocalciferol, (DRISDOL) 50000 units CAPS capsule Take 1 capsule (50,000 Units total) by mouth every 7 (seven) days. 05/16/17   Birdie Sons, MD    Allergies  Allergen Reactions  . Other Other (See Comments)    NO BLOOD PRODUCTS- JEHOVAHS WITNESS  . Sulfa Antibiotics Other (See Comments)    Loss of taste    Family History  Problem Relation Age of Onset  . Heart disease Mother   . Heart attack Mother   . Prostate cancer Father   . Ulcers Father   . Stroke Sister   . Heart attack Sister   . Diabetes Sister   . Heart attack Brother   . Stroke Brother   . Breast cancer Neg Hx     Social History Social History   Tobacco Use  . Smoking status: Never Smoker  . Smokeless tobacco: Former Systems developer    Types: Snuff  Substance Use Topics  . Alcohol use: No    Alcohol/week: 0.0 oz  . Drug use: No    Review of Systems Constitutional: Negative for fever. Cardiovascular: Positive for chest pain intermittently, none currently Respiratory: Negative for shortness of breath. Gastrointestinal: Negative for abdominal pain, vomiting Musculoskeletal: Negative for leg pain or swelling Neurological: Negative for headache All other ROS negative  ____________________________________________   PHYSICAL EXAM:  VITAL SIGNS: ED Triage Vitals  Enc Vitals Group     BP 09/14/17 1014 115/60     Pulse Rate 09/14/17 1014 77     Resp 09/14/17 1014 20     Temp 09/14/17 1014 97.8 F (36.6 C)     Temp Source 09/14/17 1014 Oral     SpO2 09/14/17 1014 98 %     Weight 09/14/17  1014 128 lb (58.1 kg)     Height --      Head Circumference --      Peak Flow --      Pain Score 09/14/17 1013 2     Pain Loc --      Pain Edu? --      Excl. in [place]? --     Constitutional: Alert and oriented. Well appearing and in no distress. Eyes: Normal exam ENT   Head: Normocephalic and atraumatic   Mouth/Throat: Mucous membranes are moist. Cardiovascular: Normal rate, regular rhythm. No  murmur Respiratory: Normal respiratory effort without tachypnea nor retractions. Breath sounds are clear Gastrointestinal: Soft and nontender. No distention.  Musculoskeletal: Nontender with normal range of motion in all extremities. No lower extremity tenderness or edema. Neurologic:  Normal speech and language. No gross focal neurologic deficits Skin:  Skin is warm, dry and intact.  Psychiatric: Mood and affect are normal.   ____________________________________________    EKG  EKG reviewed and interpreted by myself shows an atrial sensed ventricular paced rhythm at 71 bpm with a widened QRS, normal axis, normal intervals, no concerning ST changes.  ____________________________________________    RADIOLOGY  Chest x-ray is normal  ____________________________________________   INITIAL IMPRESSION / ASSESSMENT AND PLAN / ED COURSE  Pertinent labs & imaging results that were available during my care of the patient were reviewed by me and considered in my medical decision making (see chart for details).  Patient presents to the emergency department for 2 weeks of intermittent chest discomfort mostly occurring at night when lying flat.  Denies any pain currently.  Differential includes ACS, pneumonia, pneumothorax, chest wall pain, musculoskeletal pain, pulmonary edema.  The patient denies any nausea, dyspnea or diaphoresis at any time.  Denies any chest pain currently.  Overall the patient appears extremely well.  Patient's workup including cardiac enzymes are normal.  EKG shows a  ventricular paced rhythm but no concerning abnormalities.  Chest x-ray is normal.  Patient has follow-up with Dr. Nehemiah Massed for cardiology.  We will have the patient call his office today to arrange the next available appointment.  I discussed return precautions with the patient and she is agreeable to this plan.  Patient did have an alarm go off on her monitor for tachycardia at a rate of 147 bpm.  This rhythm strip was printed out and evaluated by myself, the pulse rate remains in the 60s but it is a double reading the T wave as a QRS segment.  On my review the patient remains with a normal heart rate.  A new EKG was printed out which shows a heart rate in the 60s.  I believe the patient is safe for discharge home with cardiology follow-up. ____________________________________________   FINAL CLINICAL IMPRESSION(S) / ED DIAGNOSES  chest pain    Harvest Dark, MD 09/14/17 1145    Harvest Dark, MD 09/14/17 1216

## 2017-09-14 NOTE — Telephone Encounter (Signed)
Bethany [name] called into the office today with complaints of some pain in her chest. She describes it as more of a soreness then a pain and is not" too bad". She would like to make an appointment to be seen in clinic.   I advised Ms. Asche that Inetta Fermo is out of the office today however she should contact her cardiologist for further evaluation. I also advised that if the chest pain worsens she should seek care at the ED.   Patient verbalized understanding and will contact her cardiologist.

## 2017-09-14 NOTE — ED Triage Notes (Signed)
Pt presents with left sided chest pain radiating up into her neck off and on for one week with shortness of breath.

## 2017-09-14 NOTE — Discharge Instructions (Signed)
You have been seen in the emergency department today for chest pain. Your workup has shown normal results. As we discussed please follow-up with your primary care physician in the next 1-2 days for recheck. Return to the emergency department for any further chest pain, trouble breathing, or any other symptom personally concerning to yourself. °

## 2017-09-14 NOTE — ED Notes (Signed)
Patient's monitor showing tachycardia at 123.  MD notified and new EKG printed.  MD made aware and states patient is pacing per her defibrillator and can still be d/c.

## 2017-10-03 ENCOUNTER — Ambulatory Visit (INDEPENDENT_AMBULATORY_CARE_PROVIDER_SITE_OTHER): Payer: Medicare PPO | Admitting: *Deleted

## 2017-10-03 DIAGNOSIS — I428 Other cardiomyopathies: Secondary | ICD-10-CM

## 2017-10-03 DIAGNOSIS — I5022 Chronic systolic (congestive) heart failure: Secondary | ICD-10-CM

## 2017-10-04 ENCOUNTER — Other Ambulatory Visit: Payer: Self-pay | Admitting: Family Medicine

## 2017-10-04 DIAGNOSIS — E119 Type 2 diabetes mellitus without complications: Secondary | ICD-10-CM

## 2017-10-04 NOTE — Telephone Encounter (Addendum)
Patient is overdue for o.v. Needs to schedule before refill can be approved. Please schedule within the next week or two.

## 2017-10-04 NOTE — Progress Notes (Signed)
Remote ICD transmission.   

## 2017-10-06 ENCOUNTER — Encounter: Payer: Self-pay | Admitting: Cardiology

## 2017-10-06 LAB — CUP PACEART REMOTE DEVICE CHECK
Battery Voltage: 2.96 V
Brady Statistic AP VP Percent: 0.03 %
Brady Statistic AS VP Percent: 98.69 %
Brady Statistic RA Percent Paced: 0.05 %
Date Time Interrogation Session: 20181127062503
HIGH POWER IMPEDANCE MEASURED VALUE: 57 Ohm
Implantable Lead Implant Date: 20160409
Implantable Lead Implant Date: 20160409
Implantable Lead Location: 753858
Implantable Lead Location: 753859
Implantable Lead Model: 4598
Implantable Pulse Generator Implant Date: 20160409
Lead Channel Impedance Value: 1121 Ohm
Lead Channel Impedance Value: 418 Ohm
Lead Channel Impedance Value: 532 Ohm
Lead Channel Impedance Value: 779 Ohm
Lead Channel Impedance Value: 836 Ohm
Lead Channel Pacing Threshold Amplitude: 0.625 V
Lead Channel Pacing Threshold Pulse Width: 0.4 ms
Lead Channel Sensing Intrinsic Amplitude: 10.75 mV
Lead Channel Setting Pacing Amplitude: 2 V
Lead Channel Setting Pacing Amplitude: 2.25 V
Lead Channel Setting Pacing Pulse Width: 0.4 ms
Lead Channel Setting Pacing Pulse Width: 0.8 ms
MDC IDC LEAD IMPLANT DT: 20160409
MDC IDC LEAD LOCATION: 753860
MDC IDC MSMT BATTERY REMAINING LONGEVITY: 51 mo
MDC IDC MSMT LEADCHNL LV IMPEDANCE VALUE: 1026 Ohm
MDC IDC MSMT LEADCHNL LV IMPEDANCE VALUE: 1197 Ohm
MDC IDC MSMT LEADCHNL LV IMPEDANCE VALUE: 456 Ohm
MDC IDC MSMT LEADCHNL LV IMPEDANCE VALUE: 646 Ohm
MDC IDC MSMT LEADCHNL LV IMPEDANCE VALUE: 988 Ohm
MDC IDC MSMT LEADCHNL LV IMPEDANCE VALUE: 988 Ohm
MDC IDC MSMT LEADCHNL LV PACING THRESHOLD AMPLITUDE: 1.625 V
MDC IDC MSMT LEADCHNL LV PACING THRESHOLD PULSEWIDTH: 0.8 ms
MDC IDC MSMT LEADCHNL RA PACING THRESHOLD AMPLITUDE: 0.25 V
MDC IDC MSMT LEADCHNL RA PACING THRESHOLD PULSEWIDTH: 0.4 ms
MDC IDC MSMT LEADCHNL RA SENSING INTR AMPL: 0.75 mV
MDC IDC MSMT LEADCHNL RA SENSING INTR AMPL: 0.75 mV
MDC IDC MSMT LEADCHNL RV IMPEDANCE VALUE: 361 Ohm
MDC IDC MSMT LEADCHNL RV IMPEDANCE VALUE: 513 Ohm
MDC IDC MSMT LEADCHNL RV SENSING INTR AMPL: 10.75 mV
MDC IDC SET LEADCHNL RA PACING AMPLITUDE: 1.5 V
MDC IDC SET LEADCHNL RV SENSING SENSITIVITY: 0.3 mV
MDC IDC STAT BRADY AP VS PERCENT: 0.01 %
MDC IDC STAT BRADY AS VS PERCENT: 1.26 %
MDC IDC STAT BRADY RV PERCENT PACED: 5.87 %

## 2017-10-09 ENCOUNTER — Encounter: Payer: Self-pay | Admitting: Family Medicine

## 2017-10-09 ENCOUNTER — Ambulatory Visit: Payer: Medicare PPO | Admitting: Family Medicine

## 2017-10-09 VITALS — BP 98/62 | HR 71 | Temp 97.7°F | Resp 16 | Ht 61.0 in | Wt 129.0 lb

## 2017-10-09 DIAGNOSIS — I10 Essential (primary) hypertension: Secondary | ICD-10-CM | POA: Diagnosis not present

## 2017-10-09 DIAGNOSIS — Z23 Encounter for immunization: Secondary | ICD-10-CM | POA: Diagnosis not present

## 2017-10-09 DIAGNOSIS — I471 Supraventricular tachycardia: Secondary | ICD-10-CM

## 2017-10-09 DIAGNOSIS — I5022 Chronic systolic (congestive) heart failure: Secondary | ICD-10-CM | POA: Diagnosis not present

## 2017-10-09 DIAGNOSIS — J069 Acute upper respiratory infection, unspecified: Secondary | ICD-10-CM | POA: Diagnosis not present

## 2017-10-09 DIAGNOSIS — E119 Type 2 diabetes mellitus without complications: Secondary | ICD-10-CM

## 2017-10-09 DIAGNOSIS — E039 Hypothyroidism, unspecified: Secondary | ICD-10-CM

## 2017-10-09 DIAGNOSIS — E7800 Pure hypercholesterolemia, unspecified: Secondary | ICD-10-CM

## 2017-10-09 DIAGNOSIS — E559 Vitamin D deficiency, unspecified: Secondary | ICD-10-CM | POA: Diagnosis not present

## 2017-10-09 LAB — POCT GLYCOSYLATED HEMOGLOBIN (HGB A1C)
ESTIMATED AVERAGE GLUCOSE: 180
HEMOGLOBIN A1C: 7.9

## 2017-10-09 LAB — POCT UA - MICROALBUMIN: Microalbumin Ur, POC: NEGATIVE mg/L

## 2017-10-09 MED ORDER — ATORVASTATIN CALCIUM 20 MG PO TABS: 20.0000 mg | ORAL_TABLET | Freq: Every day | ORAL | 3 refills | Status: DC

## 2017-10-09 NOTE — Progress Notes (Signed)
Patient: Bethany [name] Female    DOB: [birth year]   71 y.o.   MRN: [id number] Visit Date: 10/09/2017  Today's Provider: [name], MD   Chief Complaint  Patient presents with  . Diabetes  . Hypertension  . Congestive Heart Failure  . URI   Subjective:    HPI   Diabetes Mellitus Type II, Follow-up:   Lab Results  Component Value Date   HGBA1C 7.9 10/09/2017   HGBA1C 7.7 05/12/2017   HGBA1C 7.7 12/20/2016   Last seen for diabetes 5 months ago.  Management since then includes; no changes. She reports good compliance with treatment. She is not having side effects.  Current symptoms include none and have been stable. Home blood sugar records: fasting range: 96-120  Episodes of hypoglycemia? no   Current Insulin Regimen: none Most Recent Eye Exam: up to date Weight trend: stable Prior visit with dietician: no Current diet: well balanced Current exercise: none   Hypertension, follow-up:  BP Readings from Last 3 Encounters:  10/09/17 98/62  09/14/17 (!) 102/56  07/12/17 (!) 102/59    She was last seen for hypertension 5 months ago.  BP at that visit was 90/54. Management since that visit includes; no changes.She reports good compliance with treatment. She is not having side effects.  She is not exercising. She is adherent to low salt diet.   Outside blood pressures are checked daily. She is experiencing fatigue.  Patient denies exertional chest pressure/discomfort and palpitations.   Cardiovascular risk factors include diabetes mellitus.   Chronic systolic heart failure (HCC) From 05/12/2017-Continue routine follow up with cardiology.   Vitamin D deficiency  From 05/12/2017-labs checked, started vitamin D 50,000 units every 7 days. States she is taking consistently.  Lab Results  Component Value Date   VD25OH 20.7 (L) 05/12/2017     URI Patient reports that she has had cough and shortness of breath for about 1 week. She has been talking  Tylenol for her symptoms. She denies any fever or chills. She has had PND that comes and goes.   Allergies  Allergen Reactions  . Other Other (See Comments)    NO BLOOD PRODUCTS- JEHOVAHS WITNESS  . Sulfa Antibiotics Other (See Comments)    Loss of taste     Current Outpatient Medications:  .  acetaminophen (TYLENOL) 500 MG tablet, Take 1,000 mg by mouth 2 (two) times daily as needed for mild pain or moderate pain., Disp: , Rfl:  .  Alcohol Swabs (B-D SINGLE USE SWABS REGULAR) PADS, CHECK BLOOD SUGAR DAILY, Disp: 100 each, Rfl: 2 .  aspirin EC 81 MG tablet, Take 81 mg by mouth daily., Disp: , Rfl:  .  carvedilol (COREG) 6.25 MG tablet, Take 1 tablet by mouth 2 (two) times daily., Disp: , Rfl:  .  ferrous sulfate 325 (65 FE) MG tablet, Take 325 mg by mouth 2 (two) times daily with a meal., Disp: , Rfl:  .  furosemide (LASIX) 40 MG tablet, Take 40 mg by mouth daily., Disp: , Rfl:  .  JANUMET XR [phone] MG TB24, TAKE 1 TABLET BY MOUTH DAILY. REPLACES JANUVIA AND METFORMIN, Disp: 90 tablet, Rfl: 4 .  mirtazapine (REMERON) 7.5 MG tablet, TAKE 1 TABLET (7.5 MG TOTAL) BY MOUTH AT BEDTIME. (DOSE CHANGE), Disp: 90 tablet, Rfl: 4 .  pantoprazole (PROTONIX) 40 MG tablet, TAKE 1 TABLET EVERY DAY, Disp: 90 tablet, Rfl: 3 .  sacubitril-valsartan (ENTRESTO) 24-26 MG, Take 1 tablet by mouth  2 (two) times daily., Disp: 180 tablet, Rfl: 3 .  senna (SENOKOT) 8.6 MG TABS tablet, Take 1 tablet by mouth daily as needed for mild constipation., Disp: , Rfl:  .  spironolactone (ALDACTONE) 25 MG tablet, Take 1 tablet (25 mg total) by mouth daily., Disp: 90 tablet, Rfl: 3 .  SYNTHROID 50 MCG tablet, TAKE 1 TABLET EVERY DAY, Disp: 90 tablet, Rfl: 3 .  TRUE METRIX BLOOD GLUCOSE TEST test strip, CHECK BLOOD SUGAR EVERY DAY, Disp: 100 each, Rfl: 5 .  TRUEPLUS LANCETS 33G MISC, CHECK BLOOD SUGAR ONE TIME A DAY, Disp: 100 each, Rfl: 4 .  Vitamin D, Ergocalciferol, (DRISDOL) 50000 units CAPS capsule, Take 1 capsule  (50,000 Units total) by mouth every 7 (seven) days., Disp: 12 capsule, Rfl: 3  Review of Systems  Constitutional: Positive for fatigue. Negative for appetite change, chills and fever.  HENT: Positive for congestion.   Respiratory: Positive for cough and shortness of breath. Negative for chest tightness.   Cardiovascular: Negative for chest pain and palpitations.  Gastrointestinal: Negative for abdominal pain, nausea and vomiting.  Neurological: Negative for dizziness and weakness.    Social History   Tobacco Use  . Smoking status: Never Smoker  . Smokeless tobacco: Former Neurosurgeon    Types: Snuff  Substance Use Topics  . Alcohol use: No    Alcohol/week: 0.0 oz   Objective:   BP 98/62 (BP Location: Left Arm, Patient Position: Sitting, Cuff Size: Normal)   Pulse 71   Temp 97.7 F (36.5 C)   Resp 16   Ht 5\' 1"  (1.549 m)   Wt 129 lb (58.5 kg)   LMP  (LMP Unknown)   SpO2 96%   BMI 24.37 kg/m  Vitals:   10/09/17 1338  BP: 98/62  Pulse: 71  Resp: 16  Temp: 97.7 F (36.5 C)  SpO2: 96%  Weight: 129 lb (58.5 kg)  Height: 5\' 1"  (1.549 m)     Physical Exam   General Appearance:    Alert, cooperative, no distress  Eyes:    PERRL, conjunctiva/corneas clear, EOM's intact       Lungs:     Clear to auscultation bilaterally, respirations unlabored  Heart:    Regular rate and rhythm  Neurologic:   Awake, alert, oriented x 3. No apparent focal neurological           defect.       Results for orders placed or performed in visit on 10/09/17  POCT glycosylated hemoglobin (Hb A1C)  Result Value Ref Range   Hemoglobin A1C 7.9    Est. average glucose Bld gHb Est-mCnc 180   POCT UA - Microalbumin  Result Value Ref Range   Microalbumin Ur, POC neg mg/L   Creatinine, POC  mg/dL   Albumin/Creatinine Ratio, Urine, POC         Assessment & Plan:     1. Upper respiratory tract infection, unspecified type Counseled regarding signs and symptoms of viral and bacterial respiratory  infections. Advised to call or return for additional evaluation if she develops any sign of bacterial infection, or if current symptoms last longer than 10 days.    2. Adult hypothyroidism Lab Results  Component Value Date   TSH 2.680 05/12/2017   Continue current medications.    3. Benign essential HTN Well controlled.  Continue current medications.    4. Type 2 diabetes mellitus without complication, without long-term current use of insulin (HCC) Discussed cardiac benefits of statin in diabetic patients  and she agrees to try.  - POCT glycosylated hemoglobin (Hb A1C) - atorvastatin (LIPITOR) 20 MG tablet; Take 1 tablet (20 mg total) by mouth daily.  Dispense: 90 tablet; Refill: 3 - POCT UA - Microalbumin  5. Chronic systolic heart failure (HCC) Asymptomatic. Compliant with medication.  Continue aggressive risk factor modification.    6. Paroxysmal supraventricular tachycardia (HCC) .stable   7. Vitamin D deficiency Compliance with vitamin d replacement.  - VITAMIN D 25 Hydroxy (Vit-D Deficiency, Fractures)  8. Hypercholesteremia Start statin as above.   9. Influenza vaccine needed  - Flu vaccine HIGH DOSE PF (Fluzone High dose)  Return in about 4 months (around 02/07/2018).       Mila Merryonald Chimene Salo, MD  [facility]

## 2017-10-10 ENCOUNTER — Other Ambulatory Visit: Payer: Self-pay | Admitting: Family Medicine

## 2017-10-10 DIAGNOSIS — E119 Type 2 diabetes mellitus without complications: Secondary | ICD-10-CM

## 2017-10-10 LAB — VITAMIN D 25 HYDROXY (VIT D DEFICIENCY, FRACTURES): Vit D, 25-Hydroxy: 73 ng/mL (ref 30–100)

## 2017-10-10 MED ORDER — BD SWAB SINGLE USE REGULAR PADS: MEDICATED_PAD | 4 refills | Status: DC

## 2017-11-02 ENCOUNTER — Ambulatory Visit (INDEPENDENT_AMBULATORY_CARE_PROVIDER_SITE_OTHER): Payer: Medicare PPO | Admitting: Family Medicine

## 2017-11-02 VITALS — BP 98/60 | HR 74 | Temp 98.3°F | Resp 16 | Wt 137.0 lb

## 2017-11-02 DIAGNOSIS — J069 Acute upper respiratory infection, unspecified: Secondary | ICD-10-CM | POA: Diagnosis not present

## 2017-11-02 MED ORDER — HYDROCODONE-HOMATROPINE 5-1.5 MG/5ML PO SYRP: ORAL_SOLUTION | ORAL | 0 refills | Status: DC

## 2017-11-02 NOTE — Progress Notes (Signed)
Subjective:     Patient ID: Leanora Cover, female   DOB: 10/28/1946, 71 y.o.   MRN: [id number] Chief Complaint  Patient presents with  . URI    she reports that it started about a week ago. Coughing, chest heaviness, mild shortness of breath, chills. No fever. Cough is not prodictive but it keeps her up at night.    HPI Reports cold symptoms. Has not taken any otc medication.  Review of Systems     Objective:   Physical Exam  Constitutional: She appears well-developed and well-nourished. No distress.  Ears: T.M's intact without inflammation Throat: no tonsillar enlargement or exudate Neck: no cervical adenopathy Lungs: clear     Assessment:    1. Upper respiratory tract infection, unspecified type - HYDROcodone-homatropine (HYCODAN) 5-1.5 MG/5ML syrup; Take 5 ml at bedtime to help with cough and sleep  Dispense: 120 mL; Refill: 0    Plan:    Discussed use of Mucinex, saline nasal spray, Delsym and honey.

## 2017-11-02 NOTE — Patient Instructions (Signed)
Try Mucinex if you need an expectorant. Also Delsym for cough or two teaspoons of honey. Saline nasal spray may help with sinus congestion.

## 2017-11-16 ENCOUNTER — Telehealth: Payer: Self-pay | Admitting: Family Medicine

## 2017-11-16 ENCOUNTER — Telehealth: Payer: Self-pay | Admitting: Internal Medicine

## 2017-11-16 NOTE — Telephone Encounter (Signed)
Pt stated that for the last few weeks she has been cold and can't seem to get warm. Pt stated that she is cold on the inside. Pt denied any other symptoms. Pt is requesting to speak with a nurse. Please advise. Thanks TNP

## 2017-11-16 NOTE — Telephone Encounter (Signed)
Bethany [name] reports not feeling well x 2 weeks- has appt with PCP tomorrow. Assisted with remote transmission, transmission received, no episodes, normal device function, no alerts, heart failure diagnostics stable. She reports she heard 3 faint beeps like a hearing test. I let her know that it wasn't her ICD and may have been something else in the house. She verbalizes understanding and is appreciative.

## 2017-11-16 NOTE — Telephone Encounter (Signed)
New message   1. Has your device fired? No  2. Is you device beeping? It did 3 faint beeps while bending over to put on pants  3. Are you experiencing draining or swelling at device site? No  4. Are you calling to see if we received your device transmission? Yes  5. Have you passed out? No    Please route to Device Clinic Pool

## 2017-11-16 NOTE — Telephone Encounter (Addendum)
Per Dr. Sullivan Lone, patient should be seen by provider tomorrow. Scheduled patient for 10:45 am with Dr. Sherrie Mustache.

## 2017-11-16 NOTE — Telephone Encounter (Signed)
Returned call to patient. Patient stated she has been having a feeling of being cold on the inside, has slight shortness of breath, and nausea. Patient states her appetite has decreased slightly because she becomes nauseous when she eats. Patient also states her bowel movements have become more frequent, but does not have diarrhea. Please advise?

## 2017-11-17 ENCOUNTER — Ambulatory Visit: Payer: Medicare PPO | Admitting: Family Medicine

## 2017-11-17 ENCOUNTER — Encounter: Payer: Self-pay | Admitting: Family Medicine

## 2017-11-17 ENCOUNTER — Telehealth: Payer: Self-pay | Admitting: *Deleted

## 2017-11-17 VITALS — BP 90/50 | HR 73 | Temp 97.8°F | Resp 16 | Wt 136.0 lb

## 2017-11-17 DIAGNOSIS — R0600 Dyspnea, unspecified: Secondary | ICD-10-CM

## 2017-11-17 DIAGNOSIS — I959 Hypotension, unspecified: Secondary | ICD-10-CM

## 2017-11-17 DIAGNOSIS — R0609 Other forms of dyspnea: Secondary | ICD-10-CM

## 2017-11-17 DIAGNOSIS — R1310 Dysphagia, unspecified: Secondary | ICD-10-CM

## 2017-11-17 NOTE — Patient Instructions (Signed)
   Stop spironolactone for the next 3 days

## 2017-11-17 NOTE — Progress Notes (Signed)
Patient: Bethany [name] Female    DOB: 1946/08/04   72 y.o.   MRN: [id number] Visit Date: 11/17/2017  Today's Provider: [name], MD   No chief complaint on file.  Subjective:    HPI  Patient stated she has been having a feeling of being cold on the inside, has slight shortness of breath, and nausea. Patient states her appetite has decreased slightly because she becomes nauseous when she eats. Patient also states her bowel movements have become more frequent, but does not have diarrhea. She was started on atorvastatin 2 weeks ago but reports that her symptoms began before that.    Wt Readings from Last 3 Encounters:  11/02/17 137 lb (62.1 kg)  10/09/17 129 lb (58.5 kg)  09/14/17 128 lb (58.1 kg)     Allergies  Allergen Reactions  . Other Other (See Comments)    NO BLOOD PRODUCTS- JEHOVAHS WITNESS  . Sulfa Antibiotics Other (See Comments)    Loss of taste     Current Outpatient Medications:  .  acetaminophen (TYLENOL) 500 MG tablet, Take 1,000 mg by mouth 2 (two) times daily as needed for mild pain or moderate pain., Disp: , Rfl:  .  Alcohol Swabs (B-D SINGLE USE SWABS REGULAR) PADS, CHECK BLOOD SUGAR DAILY, Disp: 100 each, Rfl: 4 .  aspirin EC 81 MG tablet, Take 81 mg by mouth daily., Disp: , Rfl:  .  atorvastatin (LIPITOR) 20 MG tablet, Take 1 tablet (20 mg total) by mouth daily., Disp: 90 tablet, Rfl: 3 .  carvedilol (COREG) 6.25 MG tablet, Take 1 tablet by mouth 2 (two) times daily., Disp: , Rfl:  .  ferrous sulfate 325 (65 FE) MG tablet, Take 325 mg by mouth 2 (two) times daily with a meal., Disp: , Rfl:  .  furosemide (LASIX) 40 MG tablet, Take 40 mg by mouth daily., Disp: , Rfl:  .  HYDROcodone-homatropine (HYCODAN) 5-1.5 MG/5ML syrup, Take 5 ml at bedtime to help with cough and sleep, Disp: 120 mL, Rfl: 0 .  JANUMET XR [phone] MG TB24, TAKE 1 TABLET BY MOUTH DAILY. REPLACES JANUVIA AND METFORMIN, Disp: 90 tablet, Rfl: 4 .  mirtazapine (REMERON) 7.5 MG  tablet, TAKE 1 TABLET (7.5 MG TOTAL) BY MOUTH AT BEDTIME. (DOSE CHANGE), Disp: 90 tablet, Rfl: 4 .  pantoprazole (PROTONIX) 40 MG tablet, TAKE 1 TABLET EVERY DAY, Disp: 90 tablet, Rfl: 3 .  sacubitril-valsartan (ENTRESTO) 24-26 MG, Take 1 tablet by mouth 2 (two) times daily., Disp: 180 tablet, Rfl: 3 .  senna (SENOKOT) 8.6 MG TABS tablet, Take 1 tablet by mouth daily as needed for mild constipation., Disp: , Rfl:  .  spironolactone (ALDACTONE) 25 MG tablet, Take 1 tablet (25 mg total) by mouth daily., Disp: 90 tablet, Rfl: 3 .  SYNTHROID 50 MCG tablet, TAKE 1 TABLET EVERY DAY, Disp: 90 tablet, Rfl: 3 .  TRUE METRIX BLOOD GLUCOSE TEST test strip, CHECK BLOOD SUGAR EVERY DAY, Disp: 100 each, Rfl: 5 .  TRUEPLUS LANCETS 33G MISC, CHECK BLOOD SUGAR ONE TIME A DAY, Disp: 100 each, Rfl: 4 .  Vitamin D, Ergocalciferol, (DRISDOL) 50000 units CAPS capsule, Take 1 capsule (50,000 Units total) by mouth every 7 (seven) days., Disp: 12 capsule, Rfl: 3  Review of Systems  Constitutional: Negative for appetite change, chills, fatigue and fever.  Respiratory: Negative for chest tightness and shortness of breath.   Cardiovascular: Negative for chest pain and palpitations.  Gastrointestinal: Negative for abdominal pain, nausea  and vomiting.  Neurological: Negative for dizziness and weakness.    Social History   Tobacco Use  . Smoking status: Never Smoker  . Smokeless tobacco: Former Neurosurgeon    Types: Snuff  Substance Use Topics  . Alcohol use: No    Alcohol/week: 0.0 oz   Objective:    Vitals:   11/17/17 1118  BP: (!) 90/50  Pulse: 73  Resp: 16  Temp: 97.8 F (36.6 C)  TempSrc: Oral  SpO2: 95%  Weight: 136 lb (61.7 kg)     Physical Exam   General Appearance:    Alert, cooperative, no distress  Eyes:    PERRL, conjunctiva/corneas clear, EOM's intact       Lungs:     Clear to auscultation bilaterally, respirations unlabored  Heart:    Regular rate and rhythm, I/VI systolic murmur    Neurologic:   Awake, alert, oriented x 3. No apparent focal neurological           defect. Normal cerebellar functions.           Assessment & Plan:      1. DOE (dyspnea on exertion)  - CBC  2. Dysphagia, unspecified type She had MBS in 2015 which was unremarkable. Consider upper GI if labs normal and symptoms persist.   3. Hypotension, unspecified hypotension type Stop spironolactone for the time being.   - Comprehensive metabolic panel - Brain natriuretic peptide - TSH       [name], MD  [facility]

## 2017-11-17 NOTE — Telephone Encounter (Signed)
error 

## 2017-11-18 LAB — CBC
HEMOGLOBIN: 11.6 g/dL (ref 11.1–15.9)
Hematocrit: 34.8 % (ref 34.0–46.6)
MCH: 29.7 pg (ref 26.6–33.0)
MCHC: 33.3 g/dL (ref 31.5–35.7)
MCV: 89 fL (ref 79–97)
Platelets: 129 10*3/uL — ABNORMAL LOW (ref 150–379)
RBC: 3.91 x10E6/uL (ref 3.77–5.28)
RDW: 13.6 % (ref 12.3–15.4)
WBC: 3.8 10*3/uL (ref 3.4–10.8)

## 2017-11-18 LAB — BRAIN NATRIURETIC PEPTIDE: BNP: 22 pg/mL (ref 0.0–100.0)

## 2017-11-18 LAB — COMPREHENSIVE METABOLIC PANEL
ALT: 7 IU/L (ref 0–32)
AST: 14 IU/L (ref 0–40)
Albumin/Globulin Ratio: 1.6 (ref 1.2–2.2)
Albumin: 4.4 g/dL (ref 3.5–4.8)
Alkaline Phosphatase: 84 IU/L (ref 39–117)
BUN / CREAT RATIO: 9 — AB (ref 12–28)
BUN: 7 mg/dL — AB (ref 8–27)
Bilirubin Total: 0.3 mg/dL (ref 0.0–1.2)
CO2: 27 mmol/L (ref 20–29)
CREATININE: 0.8 mg/dL (ref 0.57–1.00)
Calcium: 9.6 mg/dL (ref 8.7–10.3)
Chloride: 96 mmol/L (ref 96–106)
GFR calc non Af Amer: 74 mL/min/{1.73_m2} (ref >59)
GFR, EST AFRICAN AMERICAN: 86 mL/min/{1.73_m2} (ref >59)
GLUCOSE: 201 mg/dL — AB (ref 65–99)
Globulin, Total: 2.8 g/dL (ref 1.5–4.5)
Potassium: 4 mmol/L (ref 3.5–5.2)
Sodium: 137 mmol/L (ref 134–144)
Total Protein: 7.2 g/dL (ref 6.0–8.5)

## 2017-11-18 LAB — TSH: TSH: 2.15 u[IU]/mL (ref 0.450–4.500)

## 2017-11-29 ENCOUNTER — Ambulatory Visit: Payer: Medicare PPO | Admitting: Family Medicine

## 2017-11-29 ENCOUNTER — Encounter: Payer: Self-pay | Admitting: Family Medicine

## 2017-11-29 VITALS — BP 90/56 | HR 80 | Temp 98.7°F | Resp 16 | Wt 134.0 lb

## 2017-11-29 DIAGNOSIS — I959 Hypotension, unspecified: Secondary | ICD-10-CM

## 2017-11-29 DIAGNOSIS — R531 Weakness: Secondary | ICD-10-CM

## 2017-11-29 NOTE — Progress Notes (Signed)
Patient: Bethany [name] Female    DOB: 1946-08-07   72 y.o.   MRN: [id number] Visit Date: 11/29/2017  Today's Provider: Lelon Huh, MD   Chief Complaint  Patient presents with  . Shortness of Breath   Subjective:    HPI Follow up: Last office visit 11/17/2017. Patient was seen for dyspnea on exertion, weakness, dizziness, and feeling cold inside. BP was 90/50. Labs including BNP, CBC, and met C were normal.  Patient advised to stop spironolactone for the time being.  Today patient reports good compliance. She states dizziness has resolved, she doesn't feel as week. No longer feels cold all the time, and dyspnea seems slightly improved.  Home blood pressure was 104/61 this morning.   She also reports that 4 days ago she started having lower back pain. Back pain improves wherever she has a bowel movement or whenever she applys warm compresses.   Wt Readings from Last 3 Encounters:  11/29/17 134 lb (60.8 kg)  11/17/17 136 lb (61.7 kg)  11/02/17 137 lb (62.1 kg)    BP Readings from Last 5 Encounters:  11/29/17 (!) 90/56  11/17/17 (!) 90/50  11/02/17 98/60  10/09/17 98/62  09/14/17 (!) 102/56      Allergies  Allergen Reactions  . Other Other (See Comments)    NO BLOOD PRODUCTS- JEHOVAHS WITNESS  . Sulfa Antibiotics Other (See Comments)    Loss of taste     Current Outpatient Medications:  .  acetaminophen (TYLENOL) 500 MG tablet, Take 1,000 mg by mouth 2 (two) times daily as needed for mild pain or moderate pain., Disp: , Rfl:  .  Alcohol Swabs (B-D SINGLE USE SWABS REGULAR) PADS, CHECK BLOOD SUGAR DAILY, Disp: 100 each, Rfl: 4 .  aspirin EC 81 MG tablet, Take 81 mg by mouth daily., Disp: , Rfl:  .  atorvastatin (LIPITOR) 20 MG tablet, Take 1 tablet (20 mg total) by mouth daily., Disp: 90 tablet, Rfl: 3 .  carvedilol (COREG) 6.25 MG tablet, Take 1 tablet by mouth 2 (two) times daily., Disp: , Rfl:  .  ferrous sulfate 325 (65 FE) MG tablet, Take 325 mg by mouth 2  (two) times daily with a meal., Disp: , Rfl:  .  furosemide (LASIX) 40 MG tablet, Take 20 mg by mouth daily. , Disp: , Rfl:  .  HYDROcodone-homatropine (HYCODAN) 5-1.5 MG/5ML syrup, Take 5 ml at bedtime to help with cough and sleep, Disp: 120 mL, Rfl: 0 .  JANUMET XR [phone] MG TB24, TAKE 1 TABLET BY MOUTH DAILY. REPLACES JANUVIA AND METFORMIN, Disp: 90 tablet, Rfl: 4 .  mirtazapine (REMERON) 7.5 MG tablet, TAKE 1 TABLET (7.5 MG TOTAL) BY MOUTH AT BEDTIME. (DOSE CHANGE), Disp: 90 tablet, Rfl: 4 .  pantoprazole (PROTONIX) 40 MG tablet, TAKE 1 TABLET EVERY DAY, Disp: 90 tablet, Rfl: 3 .  sacubitril-valsartan (ENTRESTO) 24-26 MG, Take 1 tablet by mouth 2 (two) times daily., Disp: 180 tablet, Rfl: 3 .  senna (SENOKOT) 8.6 MG TABS tablet, Take 1 tablet by mouth daily as needed for mild constipation., Disp: , Rfl:  .  SYNTHROID 50 MCG tablet, TAKE 1 TABLET EVERY DAY, Disp: 90 tablet, Rfl: 3 .  TRUE METRIX BLOOD GLUCOSE TEST test strip, CHECK BLOOD SUGAR EVERY DAY, Disp: 100 each, Rfl: 5 .  TRUEPLUS LANCETS 33G MISC, CHECK BLOOD SUGAR ONE TIME A DAY, Disp: 100 each, Rfl: 4 .  Vitamin D, Ergocalciferol, (DRISDOL) 50000 units CAPS capsule, Take 1 capsule (  50,000 Units total) by mouth every 7 (seven) days., Disp: 12 capsule, Rfl: 3 .  spironolactone (ALDACTONE) 25 MG tablet, Take 1 tablet (25 mg total) by mouth daily. (Patient not taking: Reported on 11/29/2017), Disp: 90 tablet, Rfl: 3  Review of Systems  Constitutional: Positive for fatigue. Negative for appetite change, chills and fever.  Respiratory: Positive for shortness of breath. Negative for chest tightness.   Cardiovascular: Negative for chest pain and palpitations.  Gastrointestinal: Negative for abdominal pain, nausea and vomiting.  Musculoskeletal: Positive for back pain.  Neurological: Positive for light-headedness. Negative for dizziness and weakness.    Social History   Tobacco Use  . Smoking status: Never Smoker  . Smokeless  tobacco: Former Systems developer    Types: Snuff  Substance Use Topics  . Alcohol use: No    Alcohol/week: 0.0 oz   Objective:   BP (!) 90/56 (BP Location: Left Arm, Patient Position: Sitting, Cuff Size: Normal)   Pulse 80   Temp 98.7 F (37.1 C) (Oral)   Resp 16   Wt 134 lb (60.8 kg)   LMP  (LMP Unknown)   SpO2 99% Comment: room air  BMI 25.32 kg/m  Vitals:   11/29/17 1351  BP: (!) 90/56  Pulse: 80  Resp: 16  Temp: 98.7 F (37.1 C)  TempSrc: Oral  SpO2: 99%  Weight: 134 lb (60.8 kg)     Physical Exam   General Appearance:    Alert, cooperative, no distress  Eyes:    PERRL, conjunctiva/corneas clear, EOM's intact       Lungs:     Clear to auscultation bilaterally, respirations unlabored  Heart:    Regular rate and rhythm  Neurologic:   Awake, alert, oriented x 3. No apparent focal neurological           defect.           Assessment & Plan:     1. Weakness   2. Hypotension, unspecified hypotension type  Likely secondary to medications. Subjectively improved since putting spironolactone on hold. She is to stay off of this until her cardiology follow up. She is to call if she develops any swelling or worsening dyspnea, or if her weight goes up more the 5 pounds. Consider stopping mirtazapine if she becomes symptomatic again.  Return in about 4 months (around 03/29/2018).        Lelon Huh, MD  [facility]

## 2017-11-30 ENCOUNTER — Other Ambulatory Visit
Admission: RE | Admit: 2017-11-30 | Discharge: 2017-11-30 | Disposition: A | Discharge status: Home / Self Care | Payer: Medicare PPO | Source: Ambulatory Visit | Attending: Family Medicine | Admitting: Family Medicine

## 2017-11-30 ENCOUNTER — Ambulatory Visit: Payer: Medicare PPO | Admitting: Family Medicine

## 2017-11-30 ENCOUNTER — Encounter: Payer: Self-pay | Admitting: Family Medicine

## 2017-11-30 ENCOUNTER — Telehealth: Payer: Self-pay | Admitting: *Deleted

## 2017-11-30 VITALS — BP 94/58 | HR 72 | Temp 97.6°F | Resp 16 | Wt 136.0 lb

## 2017-11-30 DIAGNOSIS — R5383 Other fatigue: Secondary | ICD-10-CM

## 2017-11-30 DIAGNOSIS — R079 Chest pain, unspecified: Secondary | ICD-10-CM

## 2017-11-30 LAB — COMPREHENSIVE METABOLIC PANEL
ALBUMIN: 4 g/dL (ref 3.5–5.0)
ALK PHOS: 64 U/L (ref 38–126)
ALT: 10 U/L — AB (ref 14–54)
AST: 19 U/L (ref 15–41)
Anion gap: 8 (ref 5–15)
BUN: 6 mg/dL (ref 6–20)
CALCIUM: 9.2 mg/dL (ref 8.9–10.3)
CHLORIDE: 103 mmol/L (ref 101–111)
CO2: 30 mmol/L (ref 22–32)
CREATININE: 0.68 mg/dL (ref 0.44–1.00)
GFR calc non Af Amer: >60 mL/min (ref >60)
GLUCOSE: 154 mg/dL — AB (ref 65–99)
Potassium: 3.6 mmol/L (ref 3.5–5.1)
SODIUM: 141 mmol/L (ref 135–145)
Total Bilirubin: 0.5 mg/dL (ref 0.3–1.2)
Total Protein: 7 g/dL (ref 6.5–8.1)

## 2017-11-30 LAB — CBC
HCT: 34.7 % — ABNORMAL LOW (ref 35.0–47.0)
Hemoglobin: 11.7 g/dL — ABNORMAL LOW (ref 12.0–16.0)
MCH: 29.8 pg (ref 26.0–34.0)
MCHC: 33.6 g/dL (ref 32.0–36.0)
MCV: 88.9 fL (ref 80.0–100.0)
PLATELETS: 114 10*3/uL — AB (ref 150–440)
RBC: 3.91 MIL/uL (ref 3.80–5.20)
RDW: 13.5 % (ref 11.5–14.5)
WBC: 3.5 10*3/uL — ABNORMAL LOW (ref 3.6–11.0)

## 2017-11-30 LAB — TROPONIN I: Troponin I: <0.03 ng/mL (ref <0.03)

## 2017-11-30 NOTE — Progress Notes (Signed)
Patient: Bethany [name] Female    DOB: Mar 20, 1946   72 y.o.   MRN: [id number] Visit Date: 11/30/2017  Today's Provider: [name], MD   Chief Complaint  Patient presents with  . Chest Pain   Subjective:    Chest Pain   This is a new problem. The current episode started yesterday (last night). The onset quality is sudden. The problem has been resolved. Quality: felt like she was being sawed in two. Associated symptoms include palpitations, shortness of breath and weakness. Pertinent negatives include no abdominal pain, dizziness, fever, nausea or vomiting.    Patient  woke up last night with a severe pain in rib area under right breast. Patient stated pain lasted 3-4 minutes and went away. Patient states since having pain, she was felt off. She feels weak and lightheaded. Patient states she feels like she is moving in slow motion. She has weakness in her arms and legs. Patient feels like her speech is slowing down. This morning she states she felt clammy.    Allergies  Allergen Reactions  . Other Other (See Comments)    NO BLOOD PRODUCTS- JEHOVAHS WITNESS  . Sulfa Antibiotics Other (See Comments)    Loss of taste     Current Outpatient Medications:  .  acetaminophen (TYLENOL) 500 MG tablet, Take 1,000 mg by mouth 2 (two) times daily as needed for mild pain or moderate pain., Disp: , Rfl:  .  Alcohol Swabs (B-D SINGLE USE SWABS REGULAR) PADS, CHECK BLOOD SUGAR DAILY, Disp: 100 each, Rfl: 4 .  aspirin EC 81 MG tablet, Take 81 mg by mouth daily., Disp: , Rfl:  .  atorvastatin (LIPITOR) 20 MG tablet, Take 1 tablet (20 mg total) by mouth daily., Disp: 90 tablet, Rfl: 3 .  carvedilol (COREG) 6.25 MG tablet, Take 1 tablet by mouth 2 (two) times daily., Disp: , Rfl:  .  ferrous sulfate 325 (65 FE) MG tablet, Take 325 mg by mouth 2 (two) times daily with a meal., Disp: , Rfl:  .  furosemide (LASIX) 40 MG tablet, Take 20 mg by mouth daily. , Disp: , Rfl:  .   HYDROcodone-homatropine (HYCODAN) 5-1.5 MG/5ML syrup, Take 5 ml at bedtime to help with cough and sleep, Disp: 120 mL, Rfl: 0 .  JANUMET XR [phone] MG TB24, TAKE 1 TABLET BY MOUTH DAILY. REPLACES JANUVIA AND METFORMIN, Disp: 90 tablet, Rfl: 4 .  mirtazapine (REMERON) 7.5 MG tablet, TAKE 1 TABLET (7.5 MG TOTAL) BY MOUTH AT BEDTIME. (DOSE CHANGE), Disp: 90 tablet, Rfl: 4 .  pantoprazole (PROTONIX) 40 MG tablet, TAKE 1 TABLET EVERY DAY, Disp: 90 tablet, Rfl: 3 .  sacubitril-valsartan (ENTRESTO) 24-26 MG, Take 1 tablet by mouth 2 (two) times daily., Disp: 180 tablet, Rfl: 3 .  senna (SENOKOT) 8.6 MG TABS tablet, Take 1 tablet by mouth daily as needed for mild constipation., Disp: , Rfl:  .  spironolactone (ALDACTONE) 25 MG tablet, Take 1 tablet (25 mg total) by mouth daily., Disp: 90 tablet, Rfl: 3 .  SYNTHROID 50 MCG tablet, TAKE 1 TABLET EVERY DAY, Disp: 90 tablet, Rfl: 3 .  TRUE METRIX BLOOD GLUCOSE TEST test strip, CHECK BLOOD SUGAR EVERY DAY, Disp: 100 each, Rfl: 5 .  TRUEPLUS LANCETS 33G MISC, CHECK BLOOD SUGAR ONE TIME A DAY, Disp: 100 each, Rfl: 4 .  Vitamin D, Ergocalciferol, (DRISDOL) 50000 units CAPS capsule, Take 1 capsule (50,000 Units total) by mouth every 7 (seven) days., Disp: 12 capsule,  Rfl: 3  Review of Systems  Constitutional: Negative for appetite change, chills, fatigue and fever.  Respiratory: Positive for shortness of breath. Negative for chest tightness.   Cardiovascular: Positive for chest pain and palpitations.  Gastrointestinal: Negative for abdominal pain, nausea and vomiting.  Neurological: Positive for weakness and light-headedness. Negative for dizziness.    Social History   Tobacco Use  . Smoking status: Never Smoker  . Smokeless tobacco: Former Neurosurgeon    Types: Snuff  Substance Use Topics  . Alcohol use: No    Alcohol/week: 0.0 oz   Objective:   BP (!) 94/58 (BP Location: Left Arm, Patient Position: Sitting, Cuff Size: Normal)   Pulse 72   Temp 97.6 F  (36.4 C) (Oral)   Resp 16   Wt 136 lb (61.7 kg)   LMP  (LMP Unknown)   SpO2 98% Comment: room air  BMI 25.70 kg/m  There were no vitals filed for this visit.   Physical Exam  General Appearance:    Alert, cooperative, no distress  Eyes:    PERRL, conjunctiva/corneas clear, EOM's intact       Lungs:     Clear to auscultation bilaterally, respirations unlabored  Heart:    Regular rate and rhythm  Abdomen:   bowel sounds present and normal in all 4 quadrants, soft, round, nontender or nondistended. No CVA tenderness    EKG-NSR, old lateral infarct, no acute changes.     Assessment & Plan:     1. Chest pain, unspecified type Symptoms now resolved with completely normal exam. Suspect muscle spasm. She has had cholecystectomy - EKG 12-Lead - Troponin I  2. Fatigue, unspecified type  - EKG 12-Lead - CBC - Comprehensive metabolic panel       [name], MD  [facility]

## 2017-11-30 NOTE — Patient Instructions (Signed)
   Please take your lab order to the [facility] Outpatient lab. Enter at the [facility] entrance

## 2017-11-30 NOTE — Telephone Encounter (Signed)
Patient called office stating that she woke up last night with an excruciating pain in rib area under right breast. Patient stated pain lasted 3-4 minutes and went away. Patient states since having pain, she was felt off. She feels weak and lightheaded. Patient states she feels like she is moving in slow motion. Per Dr. Sherrie Mustache, advised patient she can come in to the office to get checked out. Patient agreed to office visit.

## 2017-12-07 ENCOUNTER — Telehealth: Payer: Self-pay | Admitting: Family Medicine

## 2017-12-07 NOTE — Telephone Encounter (Signed)
Patient stated that she feels a lot better. No more abdominal pain and no more nausea.

## 2017-12-07 NOTE — Telephone Encounter (Signed)
Please check with patient to see if she has had any more abdominal pain, and if her nausea is better. When she was here a few weeks ago she was getting nauseated every time she eats and we stopped spironolactone  If nausea is not better or if having any trouble swallowing then need to order upper GI. If she is doing better then no additional tests are needed.   Thanks.

## 2017-12-11 DIAGNOSIS — I38 Endocarditis, valve unspecified: Secondary | ICD-10-CM | POA: Diagnosis not present

## 2017-12-11 DIAGNOSIS — I10 Essential (primary) hypertension: Secondary | ICD-10-CM | POA: Diagnosis not present

## 2017-12-11 DIAGNOSIS — I5022 Chronic systolic (congestive) heart failure: Secondary | ICD-10-CM | POA: Diagnosis not present

## 2017-12-11 DIAGNOSIS — I471 Supraventricular tachycardia: Secondary | ICD-10-CM | POA: Diagnosis not present

## 2017-12-11 DIAGNOSIS — Z95810 Presence of automatic (implantable) cardiac defibrillator: Secondary | ICD-10-CM | POA: Diagnosis not present

## 2017-12-11 DIAGNOSIS — E782 Mixed hyperlipidemia: Secondary | ICD-10-CM | POA: Diagnosis not present

## 2017-12-11 DIAGNOSIS — I447 Left bundle-branch block, unspecified: Secondary | ICD-10-CM | POA: Diagnosis not present

## 2018-01-02 ENCOUNTER — Ambulatory Visit (INDEPENDENT_AMBULATORY_CARE_PROVIDER_SITE_OTHER): Payer: Medicare PPO | Admitting: *Deleted

## 2018-01-02 DIAGNOSIS — I5022 Chronic systolic (congestive) heart failure: Secondary | ICD-10-CM

## 2018-01-02 DIAGNOSIS — I428 Other cardiomyopathies: Secondary | ICD-10-CM

## 2018-01-02 NOTE — Progress Notes (Signed)
Remote ICD transmission.   

## 2018-01-04 ENCOUNTER — Encounter: Payer: Self-pay | Admitting: Cardiology

## 2018-01-08 NOTE — Progress Notes (Signed)
Patient ID: Bethany [name], female    DOB: Oct 16, 1946, 72 y.o.   MRN: [id number]  HPI  Bethany [name] is a 72 y/o female with a history of valvular heart disease, thyroid disease, thrombocytopenia, hyperlipidemia, GERD, dysrhythmia (AICD), DM, depression, anxiety, anemia, remote tobacco use and chronic heart failure.   Echo report from 07/25/17 reviewed and showed an EF of 45% along with mild MR/TR. Echo done 02/05/16 and showed an EF of 35% along with mild AR/ MR/ TR. EF has improved from 15% in March 2016. Had a cardiac catheterization in 2015.  Was in the ED on 09/14/17 due to chest pain. Evaluated and released.      She presents today for a follow-up visit with a chief complaint of minimal shortness of breath upon moderate exertion. She says this has been present for several years with varying levels of severity. She has associated fatigue, cough, light-headedness and intermittent chest pain. She denies any difficulty sleeping, abdominal distention, palpitations, edema or weight gain.   Past Medical History:  Diagnosis Date  . AICD (automatic cardioverter/defibrillator) present   . Anemia   . Anxiety   . CHF (congestive heart failure) ([place])   . Complication of anesthesia   . Depression   . Diabetes ([place])   . Dilated cardiomyopathy ([place])   . Diverticulosis   . Dyspnea   . Dysrhythmia   . Edema    FEET/ANKLES OCCAS  . GERD (gastroesophageal reflux disease)   . Hyperlipidemia   . Hypothyroidism   . LV dysfunction   . Orthopnea    USES 3 PILLOWS  . PONV (postoperative nausea and vomiting)   . Reflux   . Thrombocytopenia ([place])   . Thyroid disease   . VHD (valvular heart disease)    Past Surgical History:  Procedure Laterality Date  . ABDOMINAL HYSTERECTOMY    . BI-VENTRICULAR IMPLANTABLE CARDIOVERTER DEFIBRILLATOR [name] 02/11/2015   Procedure: BI-VENTRICULAR IMPLANTABLE CARDIOVERTER DEFIBRILLATOR  (CRT-D);  Surgeon: Deboraha Sprang, MD;  Location: [facility] CATH LAB;  Service: Cardiovascular;   Laterality: [name];  . BONE MARROW BIOPSY  2007   spine  . CARDIAC CATHETERIZATION    . CATARACT EXTRACTION W/PHACO Left 10/27/2016   Procedure: CATARACT EXTRACTION PHACO AND INTRAOCULAR LENS PLACEMENT (IOC);  Surgeon: Leandrew Koyanagi, MD;  Location: [facility] ORS;  Service: Ophthalmology;  Laterality: Left;  [place]  01:12 AP%  21.0 CDE  11.59 Fluid pack lot # [id number]  . CATARACT EXTRACTION W/PHACO Right 01/03/2017   Procedure: CATARACT EXTRACTION PHACO AND INTRAOCULAR LENS PLACEMENT (IOC);  Surgeon: Leandrew Koyanagi, MD;  Location: [facility] ORS;  Service: Ophthalmology;  Laterality: Right;  [place] 00:50 AP% 19.5 CDE 9.94 fluid pack lot # [id number]  . CHOLECYSTECTOMY    . INSERT / REPLACE / REMOVE PACEMAKER     AICD  . LEAD REVISION [name] 02/11/2015   Procedure: LEAD REVISION;  Surgeon: Deboraha Sprang, MD;  Location: [facility] CATH LAB;  Service: Cardiovascular;  Laterality: [name];  . PARTIAL HYSTERECTOMY  1977  . TOOTH EXTRACTION  09/23/2015   Family History  Problem Relation Age of Onset  . Heart disease Mother   . Heart attack Mother   . Prostate cancer Father   . Ulcers Father   . Stroke Sister   . Heart attack Sister   . Diabetes Sister   . Heart attack Brother   . Stroke Brother   . Breast cancer Neg Hx    Social History   Tobacco Use  . Smoking status: Never  Smoker  . Smokeless tobacco: Former Systems developer    Types: Snuff  Substance Use Topics  . Alcohol use: No    Alcohol/week: 0.0 oz   Allergies  Allergen Reactions  . Other Other (See Comments)    NO BLOOD PRODUCTS- JEHOVAHS WITNESS  . Sulfa Antibiotics Other (See Comments)    Loss of taste   Prior to Admission medications   Medication Sig Start Date End Date Taking? Authorizing Provider  acetaminophen (TYLENOL) 500 MG tablet Take 1,000 mg by mouth 2 (two) times daily as needed for mild pain or moderate pain.   Yes [provider]  Alcohol Swabs (B-D SINGLE USE SWABS REGULAR) PADS CHECK BLOOD SUGAR DAILY 10/10/17  Yes Birdie Sons, MD  aspirin EC 81 MG tablet Take 81 mg by mouth daily.   Yes [provider]  atorvastatin (LIPITOR) 20 MG tablet Take 1 tablet (20 mg total) by mouth daily. 10/09/17  Yes Birdie Sons, MD  carvedilol (COREG) 6.25 MG tablet Take 1 tablet by mouth 2 (two) times daily. 03/26/15  Yes [provider]  ferrous sulfate 325 (65 FE) MG tablet Take 325 mg by mouth 2 (two) times daily with a meal.   Yes [provider]  furosemide (LASIX) 40 MG tablet Take 20 mg by mouth daily.    Yes [provider]  JANUMET XR [phone] MG TB24 TAKE 1 TABLET BY MOUTH DAILY. REPLACES JANUVIA AND METFORMIN 04/20/17  Yes Birdie Sons, MD  mirtazapine (REMERON) 7.5 MG tablet TAKE 1 TABLET (7.5 MG TOTAL) BY MOUTH AT BEDTIME. (DOSE CHANGE) Patient taking differently: TAKE 1 TABLET (7.5 MG TOTAL) BY MOUTH AT BEDTIME. (DOSE CHANGE). takes as needed 04/20/17  Yes Birdie Sons, MD  pantoprazole (PROTONIX) 40 MG tablet TAKE 1 TABLET EVERY DAY 03/30/17  Yes Birdie Sons, MD  sacubitril-valsartan (ENTRESTO) 24-26 MG Take 1 tablet by mouth 2 (two) times daily. 03/13/17  Yes Hackney, Tina A, FNP  senna (SENOKOT) 8.6 MG TABS tablet Take 1 tablet by mouth daily as needed for mild constipation.   Yes [provider]  spironolactone (ALDACTONE) 25 MG tablet Take 1 tablet (25 mg total) by mouth daily. 04/04/17  Yes Alisa Graff, FNP  SYNTHROID 50 MCG tablet TAKE 1 TABLET EVERY DAY 03/30/17  Yes Birdie Sons, MD  TRUE METRIX BLOOD GLUCOSE TEST test strip CHECK BLOOD SUGAR EVERY DAY 03/30/17  Yes Birdie Sons, MD  TRUEPLUS LANCETS 33G MISC CHECK BLOOD SUGAR ONE TIME A DAY 07/03/17  Yes Birdie Sons, MD  Vitamin D, Ergocalciferol, (DRISDOL) 50000 units CAPS capsule Take 1 capsule (50,000 Units total) by mouth every 7 (seven) days. Patient taking differently: Take 50,000 Units by mouth every 7 (seven) days. On Fridays 05/16/17  Yes Birdie Sons, MD    Review of Systems   Constitutional: Positive for fatigue. Negative for appetite change.  HENT: Negative for congestion, postnasal drip and sore throat.   Eyes: Negative.   Respiratory: Positive for cough and shortness of breath (when going up steps). Negative for chest tightness.   Cardiovascular: Positive for chest pain (infrequent). Negative for palpitations and leg swelling.  Gastrointestinal: Negative for abdominal distention and abdominal pain.  Endocrine: Negative.   Genitourinary: Negative.   Musculoskeletal: Negative for back pain and neck pain.  Skin: Negative.   Allergic/Immunologic: Negative.   Neurological: Positive for light-headedness. Negative for dizziness.  Hematological: Negative for adenopathy. Does not bruise/bleed easily.  Psychiatric/Behavioral: Negative for dysphoric  mood and sleep disturbance (sleeping on 2-3 pillows). The patient is not nervous/anxious.    Vitals:   01/09/18 1056  BP: 107/61  Pulse: 75  Resp: 18  SpO2: 99%  Weight: 135 lb 2 oz (61.3 kg)  Height: _0  (1.549 m)   Wt Readings from Last 3 Encounters:  01/09/18 135 lb 2 oz (61.3 kg)  11/30/17 136 lb (61.7 kg)  11/29/17 134 lb (60.8 kg)   Lab Results  Component Value Date   CREATININE 0.68 11/30/2017   CREATININE 0.80 11/17/2017   CREATININE 0.75 09/14/2017   Physical Exam  Constitutional: She is oriented to person, place, and time. She appears well-developed and well-nourished.  HENT:  Head: Normocephalic and atraumatic.  Neck: Normal range of motion. Neck supple. No JVD present.  Cardiovascular: Normal rate and regular rhythm.  Pulmonary/Chest: Effort normal. She has no wheezes. She has no rales.  Abdominal: Soft. She exhibits no distension. There is no tenderness.  Musculoskeletal: She exhibits no edema or tenderness.  Neurological: She is alert and oriented to person, place, and time.  Skin: Skin is warm and dry.  Psychiatric: She has a normal mood and affect. Her behavior is normal. Thought  content normal.  Nursing note and vitals reviewed.   Assessment & Plan:    1: Chronic heart failure with reduced ejection fraction-  - NYHA class II - euvolemic - weighing daily. Reminded to call for an overnight weight gain of >2 pounds or a weekly weight gain of >5 pounds - on low dose entresto  - not adding salt to her food - saw cardiologist Bethany [name]) 12/11/17 & discussion had about a stress test in the future - no firings from her AICD - PharmD reconciled medications with the patient  2: Hypotension-  - BP on the low side so unable to titrate up entresto or carvedilol - occasional lightheadedness - encouraged slow position changes - BMP on 11/30/17 reviewed and showed sodium 141, potassium 3.6 and GFR >60  3: Diabetes-  - fasting glucose at home today was 106  - A1c from 10/09/17 is 7.9%  Medication list was reviewed.   Will have patient transition fully to her cardiologist's care. Advised patient that she could call back at anytime for a future appointment.

## 2018-01-09 ENCOUNTER — Encounter: Payer: Self-pay | Admitting: Family

## 2018-01-09 ENCOUNTER — Ambulatory Visit: Payer: Medicare PPO | Attending: Family | Admitting: Family

## 2018-01-09 VITALS — BP 107/61 | HR 75 | Resp 18 | Ht 61.0 in | Wt 135.1 lb

## 2018-01-09 DIAGNOSIS — Z8249 Family history of ischemic heart disease and other diseases of the circulatory system: Secondary | ICD-10-CM | POA: Insufficient documentation

## 2018-01-09 DIAGNOSIS — Z90710 Acquired absence of both cervix and uterus: Secondary | ICD-10-CM | POA: Diagnosis not present

## 2018-01-09 DIAGNOSIS — I959 Hypotension, unspecified: Secondary | ICD-10-CM | POA: Diagnosis not present

## 2018-01-09 DIAGNOSIS — Z882 Allergy status to sulfonamides status: Secondary | ICD-10-CM | POA: Insufficient documentation

## 2018-01-09 DIAGNOSIS — E119 Type 2 diabetes mellitus without complications: Secondary | ICD-10-CM | POA: Insufficient documentation

## 2018-01-09 DIAGNOSIS — Z7982 Long term (current) use of aspirin: Secondary | ICD-10-CM | POA: Insufficient documentation

## 2018-01-09 DIAGNOSIS — D649 Anemia, unspecified: Secondary | ICD-10-CM | POA: Diagnosis not present

## 2018-01-09 DIAGNOSIS — I5022 Chronic systolic (congestive) heart failure: Secondary | ICD-10-CM | POA: Diagnosis not present

## 2018-01-09 DIAGNOSIS — Z79899 Other long term (current) drug therapy: Secondary | ICD-10-CM | POA: Diagnosis not present

## 2018-01-09 DIAGNOSIS — E785 Hyperlipidemia, unspecified: Secondary | ICD-10-CM | POA: Insufficient documentation

## 2018-01-09 DIAGNOSIS — K219 Gastro-esophageal reflux disease without esophagitis: Secondary | ICD-10-CM | POA: Insufficient documentation

## 2018-01-09 DIAGNOSIS — Z9842 Cataract extraction status, left eye: Secondary | ICD-10-CM | POA: Diagnosis not present

## 2018-01-09 DIAGNOSIS — Z7984 Long term (current) use of oral hypoglycemic drugs: Secondary | ICD-10-CM | POA: Diagnosis not present

## 2018-01-09 DIAGNOSIS — Z95810 Presence of automatic (implantable) cardiac defibrillator: Secondary | ICD-10-CM | POA: Diagnosis not present

## 2018-01-09 DIAGNOSIS — Z833 Family history of diabetes mellitus: Secondary | ICD-10-CM | POA: Diagnosis not present

## 2018-01-09 DIAGNOSIS — Z79890 Hormone replacement therapy (postmenopausal): Secondary | ICD-10-CM | POA: Insufficient documentation

## 2018-01-09 DIAGNOSIS — Z823 Family history of stroke: Secondary | ICD-10-CM | POA: Diagnosis not present

## 2018-01-09 DIAGNOSIS — Z8042 Family history of malignant neoplasm of prostate: Secondary | ICD-10-CM | POA: Diagnosis not present

## 2018-01-09 DIAGNOSIS — E039 Hypothyroidism, unspecified: Secondary | ICD-10-CM | POA: Diagnosis not present

## 2018-01-09 DIAGNOSIS — Z9841 Cataract extraction status, right eye: Secondary | ICD-10-CM | POA: Insufficient documentation

## 2018-01-09 DIAGNOSIS — Z961 Presence of intraocular lens: Secondary | ICD-10-CM | POA: Diagnosis not present

## 2018-01-09 DIAGNOSIS — I420 Dilated cardiomyopathy: Secondary | ICD-10-CM | POA: Insufficient documentation

## 2018-01-09 DIAGNOSIS — Z87891 Personal history of nicotine dependence: Secondary | ICD-10-CM | POA: Diagnosis not present

## 2018-01-09 DIAGNOSIS — Z9049 Acquired absence of other specified parts of digestive tract: Secondary | ICD-10-CM | POA: Insufficient documentation

## 2018-01-09 NOTE — Patient Instructions (Signed)
Continue weighing daily and call for an overnight weight gain of > 2 pounds or a weekly weight gain of >5 pounds. 

## 2018-01-16 ENCOUNTER — Encounter: Payer: Self-pay | Admitting: Podiatry

## 2018-01-16 ENCOUNTER — Ambulatory Visit: Payer: Medicare PPO | Admitting: Podiatry

## 2018-01-16 DIAGNOSIS — L600 Ingrowing nail: Secondary | ICD-10-CM

## 2018-01-17 LAB — CUP PACEART REMOTE DEVICE CHECK
Battery Remaining Longevity: 46 mo
Battery Voltage: 2.96 V
Brady Statistic AS VP Percent: 98.68 %
Brady Statistic AS VS Percent: 1.28 %
Brady Statistic RA Percent Paced: 0.04 %
Date Time Interrogation Session: 20190226062303
HIGH POWER IMPEDANCE MEASURED VALUE: 53 Ohm
Implantable Lead Implant Date: 20160409
Implantable Lead Location: 753858
Implantable Lead Location: 753859
Implantable Lead Location: 753860
Implantable Lead Model: 4598
Implantable Pulse Generator Implant Date: 20160409
Lead Channel Impedance Value: 1007 Ohm
Lead Channel Impedance Value: 1026 Ohm
Lead Channel Impedance Value: 361 Ohm
Lead Channel Impedance Value: 475 Ohm
Lead Channel Impedance Value: 513 Ohm
Lead Channel Impedance Value: 817 Ohm
Lead Channel Impedance Value: 893 Ohm
Lead Channel Impedance Value: 950 Ohm
Lead Channel Pacing Threshold Amplitude: 0.375 V
Lead Channel Pacing Threshold Amplitude: 0.625 V
Lead Channel Pacing Threshold Amplitude: 1.625 V
Lead Channel Pacing Threshold Pulse Width: 0.8 ms
Lead Channel Sensing Intrinsic Amplitude: 0.625 mV
Lead Channel Sensing Intrinsic Amplitude: 10.625 mV
Lead Channel Setting Pacing Amplitude: 1.5 V
Lead Channel Setting Pacing Amplitude: 2 V
Lead Channel Setting Pacing Amplitude: 2.25 V
Lead Channel Setting Pacing Pulse Width: 0.4 ms
Lead Channel Setting Sensing Sensitivity: 0.3 mV
MDC IDC LEAD IMPLANT DT: 20160409
MDC IDC LEAD IMPLANT DT: 20160409
MDC IDC MSMT LEADCHNL LV IMPEDANCE VALUE: 418 Ohm
MDC IDC MSMT LEADCHNL LV IMPEDANCE VALUE: 513 Ohm
MDC IDC MSMT LEADCHNL LV IMPEDANCE VALUE: 722 Ohm
MDC IDC MSMT LEADCHNL LV IMPEDANCE VALUE: 836 Ohm
MDC IDC MSMT LEADCHNL RA IMPEDANCE VALUE: 418 Ohm
MDC IDC MSMT LEADCHNL RA PACING THRESHOLD PULSEWIDTH: 0.4 ms
MDC IDC MSMT LEADCHNL RA SENSING INTR AMPL: 0.625 mV
MDC IDC MSMT LEADCHNL RV PACING THRESHOLD PULSEWIDTH: 0.4 ms
MDC IDC MSMT LEADCHNL RV SENSING INTR AMPL: 10.625 mV
MDC IDC SET LEADCHNL LV PACING PULSEWIDTH: 0.8 ms
MDC IDC STAT BRADY AP VP PERCENT: 0.03 %
MDC IDC STAT BRADY AP VS PERCENT: 0.01 %
MDC IDC STAT BRADY RV PERCENT PACED: 3.75 %

## 2018-01-17 NOTE — Progress Notes (Signed)
   Subjective: Patient presents today for evaluation of intermittent pain to the medial border of the right great toe that began several years ago. She reports associated swelling and redness of the area. Patient is concerned for possible ingrown nail. Wearing shoes increases the pain. She has not done anything to treat the symptoms. Patient presents today for further treatment and evaluation.   Past Medical History:  Diagnosis Date  . AICD (automatic cardioverter/defibrillator) present   . Anemia   . Anxiety   . CHF (congestive heart failure) ([facility])   . Complication of anesthesia   . Depression   . Diabetes ([facility])   . Dilated cardiomyopathy ([facility])   . Diverticulosis   . Dyspnea   . Dysrhythmia   . Edema    FEET/ANKLES OCCAS  . GERD (gastroesophageal reflux disease)   . Hyperlipidemia   . Hypothyroidism   . LV dysfunction   . Orthopnea    USES 3 PILLOWS  . PONV (postoperative nausea and vomiting)   . Reflux   . Thrombocytopenia ([facility])   . Thyroid disease   . VHD (valvular heart disease)     Objective:  General: Well developed, nourished, in no acute distress, alert and oriented x3   Dermatology: Skin is warm, dry and supple bilateral. Medial border of the right great toe appears to be erythematous with evidence of an ingrowing nail. Pain on palpation noted to the border of the nail fold. The remaining nails appear unremarkable at this time. There are no open sores, lesions.  Vascular: Dorsalis Pedis artery and Posterior Tibial artery pedal pulses palpable. No lower extremity edema noted.   Neruologic: Grossly intact via light touch bilateral.  Musculoskeletal: Muscular strength within normal limits in all groups bilateral. Normal range of motion noted to all pedal and ankle joints.   Assesement: #1 Paronychia with ingrowing nail medial border right great toe #2 Pain in toe #3 Incurvated nail  Plan of Care:  1. Patient evaluated.  2. Discussed treatment alternatives and  plan of care. Explained nail avulsion procedure and post procedure course to patient. 3. Patient opted for conservative treatment at this time.  4. Mechanical debridement of right great toenail performed using a nail nipper. Filed with dremel without incident.  5. Recommended wide shoe gear.  6. Return to clinic as needed if she would like to proceed with nail avulsion procedure.   Felecia Shelling, DPM [facility]  Dr. Felecia Shelling, DPM    [address]                                        [place], [place] [address]                Office [phone]  Fax [phone]

## 2018-01-19 DIAGNOSIS — S0500XA Injury of conjunctiva and corneal abrasion without foreign body, unspecified eye, initial encounter: Secondary | ICD-10-CM | POA: Diagnosis not present

## 2018-02-05 ENCOUNTER — Encounter: Payer: Self-pay | Admitting: Ophthalmology

## 2018-02-08 ENCOUNTER — Ambulatory Visit: Payer: Self-pay | Admitting: Family Medicine

## 2018-02-13 ENCOUNTER — Other Ambulatory Visit: Payer: Self-pay | Admitting: Family Medicine

## 2018-02-26 ENCOUNTER — Other Ambulatory Visit: Payer: Self-pay

## 2018-02-26 MED ORDER — SACUBITRIL-VALSARTAN 24-26 MG PO TABS: 1.0000 | ORAL_TABLET | Freq: Two times a day (BID) | ORAL | 3 refills | Status: DC

## 2018-03-02 ENCOUNTER — Ambulatory Visit: Payer: Medicare PPO | Admitting: Physician Assistant

## 2018-03-02 ENCOUNTER — Telehealth: Payer: Self-pay

## 2018-03-02 ENCOUNTER — Encounter: Payer: Self-pay | Admitting: Physician Assistant

## 2018-03-02 VITALS — BP 104/60 | HR 68 | Temp 98.3°F | Resp 16 | Wt 131.0 lb

## 2018-03-02 DIAGNOSIS — R109 Unspecified abdominal pain: Secondary | ICD-10-CM

## 2018-03-02 LAB — POCT URINALYSIS DIPSTICK
Bilirubin, UA: NEGATIVE
Blood, UA: NEGATIVE
Glucose, UA: NEGATIVE
Ketones, UA: NEGATIVE
Leukocytes, UA: NEGATIVE
Nitrite, UA: NEGATIVE
Protein, UA: NEGATIVE
Spec Grav, UA: 1.01 (ref 1.010–1.025)
Urobilinogen, UA: 0.2 E.U./dL
pH, UA: 6 (ref 5.0–8.0)

## 2018-03-02 NOTE — Patient Instructions (Addendum)

## 2018-03-02 NOTE — Progress Notes (Signed)
Patient: Bethany [name] Female    DOB: February 26, 1946   72 y.o.   MRN: [id number] Visit Date: 03/02/2018  Today's Provider: Trey Sailors, PA-C   Chief Complaint  Patient presents with  . Abdominal Pain   Subjective:    HPI Pt w/ history of CHF, LBBB, HTN is her today for abdominal pain. She reports that it start in the lower abdomen and runs up into her epigastric region. She reports that it is on and off. She has nausea, non bloody diarrhea, chills and dizziness. Pt denies fever. She has not found that her symptoms are related to food. She believes that she has a touch of a urinary infection. She denies chest pain. She does not have any flank pain, frequency, or dysuria. She reports normal appetite. She feels fatigued. She does not report one sided weakness or visual change.     Allergies  Allergen Reactions  . Other Other (See Comments)    NO BLOOD PRODUCTS- JEHOVAHS WITNESS  . Sulfa Antibiotics Other (See Comments)    Loss of taste     Current Outpatient Medications:  .  acetaminophen (TYLENOL) 500 MG tablet, Take 1,000 mg by mouth 2 (two) times daily as needed for mild pain or moderate pain., Disp: , Rfl:  .  Alcohol Swabs (B-D SINGLE USE SWABS REGULAR) PADS, CHECK BLOOD SUGAR DAILY, Disp: 100 each, Rfl: 4 .  aspirin EC 81 MG tablet, Take 81 mg by mouth daily., Disp: , Rfl:  .  atorvastatin (LIPITOR) 20 MG tablet, Take 1 tablet (20 mg total) by mouth daily., Disp: 90 tablet, Rfl: 3 .  carvedilol (COREG) 6.25 MG tablet, Take 1 tablet by mouth 2 (two) times daily., Disp: , Rfl:  .  ferrous sulfate 325 (65 FE) MG tablet, Take 325 mg by mouth 2 (two) times daily with a meal., Disp: , Rfl:  .  furosemide (LASIX) 40 MG tablet, Take 20 mg by mouth daily. , Disp: , Rfl:  .  JANUMET XR [phone] MG TB24, TAKE 1 TABLET DAILY. REPLACES JANUVIA AND METFORMIN, Disp: 90 tablet, Rfl: 3 .  pantoprazole (PROTONIX) 40 MG tablet, TAKE 1 TABLET EVERY DAY, Disp: 90 tablet, Rfl: 3 .   sacubitril-valsartan (ENTRESTO) 24-26 MG, Take 1 tablet by mouth 2 (two) times daily., Disp: 180 tablet, Rfl: 3 .  senna (SENOKOT) 8.6 MG TABS tablet, Take 1 tablet by mouth daily as needed for mild constipation., Disp: , Rfl:  .  SYNTHROID 50 MCG tablet, TAKE 1 TABLET EVERY DAY, Disp: 90 tablet, Rfl: 3 .  TRUE METRIX BLOOD GLUCOSE TEST test strip, CHECK BLOOD SUGAR EVERY DAY, Disp: 100 each, Rfl: 5 .  TRUEPLUS LANCETS 33G MISC, CHECK BLOOD SUGAR ONE TIME A DAY, Disp: 100 each, Rfl: 4 .  Vitamin D, Ergocalciferol, (DRISDOL) 50000 units CAPS capsule, Take 1 capsule (50,000 Units total) by mouth every 7 (seven) days. (Patient taking differently: Take 50,000 Units by mouth every 7 (seven) days. On Fridays), Disp: 12 capsule, Rfl: 3 .  mirtazapine (REMERON) 7.5 MG tablet, TAKE 1 TABLET (7.5 MG TOTAL) BY MOUTH AT BEDTIME. (DOSE CHANGE) (Patient not taking: Reported on 03/02/2018), Disp: 90 tablet, Rfl: 4 .  spironolactone (ALDACTONE) 25 MG tablet, Take 1 tablet (25 mg total) by mouth daily. (Patient not taking: Reported on 03/02/2018), Disp: 90 tablet, Rfl: 3  Review of Systems  Constitutional: Positive for fatigue.  HENT: Negative.   Eyes: Negative.   Respiratory: Negative.   Cardiovascular:  Negative.   Gastrointestinal: Positive for abdominal pain, diarrhea and nausea.  Endocrine: Negative.   Genitourinary: Negative.   Musculoskeletal: Negative.   Skin: Negative.   Allergic/Immunologic: Negative.   Neurological: Positive for weakness.  Hematological: Negative.   Psychiatric/Behavioral: Negative.     Social History   Tobacco Use  . Smoking status: Never Smoker  . Smokeless tobacco: Former Neurosurgeon    Types: Snuff  Substance Use Topics  . Alcohol use: No    Alcohol/week: 0.0 oz   Objective:   BP 104/60 (BP Location: Left Arm, Patient Position: Sitting, Cuff Size: Normal)   Pulse 68   Temp 98.3 F (36.8 C) (Oral)   Resp 16   Wt 131 lb (59.4 kg)   LMP  (LMP Unknown)   SpO2 98%   BMI  24.75 kg/m  Vitals:   03/02/18 1137  BP: 104/60  Pulse: 68  Resp: 16  Temp: 98.3 F (36.8 C)  TempSrc: Oral  SpO2: 98%  Weight: 131 lb (59.4 kg)     Physical Exam  Constitutional: She appears well-developed and well-nourished.  Cardiovascular: Normal rate, regular rhythm and normal heart sounds.  Pulmonary/Chest: Effort normal and breath sounds normal.  Abdominal: Normal appearance and bowel sounds are normal. There is no tenderness. There is no rigidity, no rebound and no CVA tenderness.  Patient reports nausea during abdominal palpation, but no tenderness.   Neurological: She is alert.  Skin: Skin is warm and dry.  Psychiatric: She has a normal mood and affect. Her behavior is normal.        Assessment & Plan:     1. Abdominal pain, unspecified abdominal location  She does not have gallbladder. BP is on low side, does not report focal abdominal pain. Suspect viral gastroenteritis. She declines EKG, heart and lung sounds normal. Advised on bland diet with plenty of fluids to prevent dehydration and orthostatic hypotension. She declines nausea medication.   - POCT urinalysis dipstick  Return if symptoms worsen or fail to improve.  The entirety of the information documented in the History of Present Illness, Review of Systems and Physical Exam were personally obtained by me. Portions of this information were initially documented by Robet Leu, CMA and reviewed by me for thoroughness and accuracy.           Trey Sailors, PA-C  [facility]

## 2018-03-02 NOTE — Telephone Encounter (Signed)
Patient called office this morning with complaints of lower abdominal pain that she describes as sharp. Patient states that pain began at 3 or 4 AM. Patient denied fever but states that she has nausea, dizziness and body chills. Patient did admit to having episodes of diarrhea that began the day before. Advised patient to come in office for evaluation since abdominal pain and nausea are persisting. KW

## 2018-03-28 ENCOUNTER — Other Ambulatory Visit: Payer: Self-pay | Admitting: Family Medicine

## 2018-03-28 DIAGNOSIS — E559 Vitamin D deficiency, unspecified: Secondary | ICD-10-CM

## 2018-03-30 ENCOUNTER — Ambulatory Visit: Payer: Medicare PPO | Admitting: Family Medicine

## 2018-04-03 ENCOUNTER — Other Ambulatory Visit: Payer: Self-pay | Admitting: Family Medicine

## 2018-04-03 DIAGNOSIS — E119 Type 2 diabetes mellitus without complications: Secondary | ICD-10-CM

## 2018-04-04 ENCOUNTER — Ambulatory Visit (INDEPENDENT_AMBULATORY_CARE_PROVIDER_SITE_OTHER): Payer: Medicare PPO

## 2018-04-04 ENCOUNTER — Ambulatory Visit (INDEPENDENT_AMBULATORY_CARE_PROVIDER_SITE_OTHER): Payer: Medicare PPO | Admitting: Family Medicine

## 2018-04-04 ENCOUNTER — Ambulatory Visit (INDEPENDENT_AMBULATORY_CARE_PROVIDER_SITE_OTHER): Payer: Medicare PPO | Admitting: *Deleted

## 2018-04-04 VITALS — BP 84/54 | HR 73 | Temp 98.3°F | Ht 61.0 in | Wt 130.2 lb

## 2018-04-04 DIAGNOSIS — I471 Supraventricular tachycardia: Secondary | ICD-10-CM

## 2018-04-04 DIAGNOSIS — E039 Hypothyroidism, unspecified: Secondary | ICD-10-CM

## 2018-04-04 DIAGNOSIS — I447 Left bundle-branch block, unspecified: Secondary | ICD-10-CM

## 2018-04-04 DIAGNOSIS — I5022 Chronic systolic (congestive) heart failure: Secondary | ICD-10-CM | POA: Diagnosis not present

## 2018-04-04 DIAGNOSIS — I10 Essential (primary) hypertension: Secondary | ICD-10-CM | POA: Diagnosis not present

## 2018-04-04 DIAGNOSIS — I428 Other cardiomyopathies: Secondary | ICD-10-CM

## 2018-04-04 DIAGNOSIS — E119 Type 2 diabetes mellitus without complications: Secondary | ICD-10-CM | POA: Diagnosis not present

## 2018-04-04 DIAGNOSIS — D649 Anemia, unspecified: Secondary | ICD-10-CM | POA: Diagnosis not present

## 2018-04-04 DIAGNOSIS — R5383 Other fatigue: Secondary | ICD-10-CM | POA: Diagnosis not present

## 2018-04-04 DIAGNOSIS — E559 Vitamin D deficiency, unspecified: Secondary | ICD-10-CM

## 2018-04-04 DIAGNOSIS — Z0000 Encounter for general adult medical examination without abnormal findings: Secondary | ICD-10-CM

## 2018-04-04 NOTE — Progress Notes (Signed)
Remote ICD transmission.   

## 2018-04-04 NOTE — Patient Instructions (Signed)
   The CDC recommends two doses of Shingrix (the shingles vaccine) separated by 2 to 6 months for adults age 72 years and older. I recommend checking with your insurance plan regarding coverage for this vaccine.   

## 2018-04-04 NOTE — Patient Instructions (Addendum)
[name] , Thank you for taking time to come for your Medicare Wellness Visit. I appreciate your ongoing commitment to your health goals. Please review the following plan we discussed and let me know if I can assist you in the future.   Screening recommendations/referrals: Colonoscopy: Up to date Mammogram: Up to date Bone Density: Up to date Recommended yearly ophthalmology/optometry visit for glaucoma screening and checkup Recommended yearly dental visit for hygiene and checkup  Vaccinations: Influenza vaccine: Up to date Pneumococcal vaccine: Up to date Tdap vaccine: Pt declines today.  Shingles vaccine: Pt declines today.     Advanced directives: Already on file.  Conditions/risks identified: Recommend increasing fruits and vegetables in daily diet to 2-3 servings each a day.   Next appointment: 2:00 PM today with [name].    Preventive Care 28 Years and Older, Female Preventive care refers to lifestyle choices and visits with your health care provider that can promote health and wellness. What does preventive care include?  A yearly physical exam. This is also called an annual well check.  Dental exams once or twice a year.  Routine eye exams. Ask your health care provider how often you should have your eyes checked.  Personal lifestyle choices, including:  Daily care of your teeth and gums.  Regular physical activity.  Eating a healthy diet.  Avoiding tobacco and drug use.  Limiting alcohol use.  Practicing safe sex.  Taking low-dose aspirin every day.  Taking vitamin and mineral supplements as recommended by your health care provider. What happens during an annual well check? The services and screenings done by your health care provider during your annual well check will depend on your age, overall health, lifestyle risk factors, and family history of disease. Counseling  Your health care provider may ask you questions about your:  Alcohol  use.  Tobacco use.  Drug use.  Emotional well-being.  Home and relationship well-being.  Sexual activity.  Eating habits.  History of falls.  Memory and ability to understand (cognition).  Work and work Astronomer.  Reproductive health. Screening  You may have the following tests or measurements:  Height, weight, and BMI.  Blood pressure.  Lipid and cholesterol levels. These may be checked every 5 years, or more frequently if you are over 29 years old.  Skin check.  Lung cancer screening. You may have this screening every year starting at age 63 if you have a 30-pack-year history of smoking and currently smoke or have quit within the past 15 years.  Fecal occult blood test (FOBT) of the stool. You may have this test every year starting at age 73.  Flexible sigmoidoscopy or colonoscopy. You may have a sigmoidoscopy every 5 years or a colonoscopy every 10 years starting at age 90.  Hepatitis C blood test.  Hepatitis B blood test.  Sexually transmitted disease (STD) testing.  Diabetes screening. This is done by checking your blood sugar (glucose) after you have not eaten for a while (fasting). You may have this done every 1-3 years.  Bone density scan. This is done to screen for osteoporosis. You may have this done starting at age 58.  Mammogram. This may be done every 1-2 years. Talk to your health care provider about how often you should have regular mammograms. Talk with your health care provider about your test results, treatment options, and if necessary, the need for more tests. Vaccines  Your health care provider may recommend certain vaccines, such as:  Influenza vaccine. This is  recommended every year.  Tetanus, diphtheria, and acellular pertussis (Tdap, Td) vaccine. You may need a Td booster every 10 years.  Zoster vaccine. You may need this after age 38.  Pneumococcal 13-valent conjugate (PCV13) vaccine. One dose is recommended after age  62.  Pneumococcal polysaccharide (PPSV23) vaccine. One dose is recommended after age 50. Talk to your health care provider about which screenings and vaccines you need and how often you need them. This information is not intended to replace advice given to you by your health care provider. Make sure you discuss any questions you have with your health care provider. Document Released: 11/20/2015 Document Revised: 07/13/2016 Document Reviewed: 08/25/2015 Elsevier Interactive Patient Education  2017 [place] Prevention in the Home Falls can cause injuries. They can happen to people of all ages. There are many things you can do to make your home safe and to help prevent falls. What can I do on the outside of my home?  Regularly fix the edges of walkways and driveways and fix any cracks.  Remove anything that might make you trip as you walk through a door, such as a raised step or threshold.  Trim any bushes or trees on the path to your home.  Use bright outdoor lighting.  Clear any walking paths of anything that might make someone trip, such as rocks or tools.  Regularly check to see if handrails are loose or broken. Make sure that both sides of any steps have handrails.  Any raised decks and porches should have guardrails on the edges.  Have any leaves, snow, or ice cleared regularly.  Use sand or salt on walking paths during winter.  Clean up any spills in your garage right away. This includes oil or grease spills. What can I do in the bathroom?  Use night lights.  Install grab bars by the toilet and in the tub and shower. Do not use towel bars as grab bars.  Use non-skid mats or decals in the tub or shower.  If you need to sit down in the shower, use a plastic, non-slip stool.  Keep the floor dry. Clean up any water that spills on the floor as soon as it happens.  Remove soap buildup in the tub or shower regularly.  Attach bath mats securely with double-sided  non-slip rug tape.  Do not have throw rugs and other things on the floor that can make you trip. What can I do in the bedroom?  Use night lights.  Make sure that you have a light by your bed that is easy to reach.  Do not use any sheets or blankets that are too big for your bed. They should not hang down onto the floor.  Have a firm chair that has side arms. You can use this for support while you get dressed.  Do not have throw rugs and other things on the floor that can make you trip. What can I do in the kitchen?  Clean up any spills right away.  Avoid walking on wet floors.  Keep items that you use a lot in easy-to-reach places.  If you need to reach something above you, use a strong step stool that has a grab bar.  Keep electrical cords out of the way.  Do not use floor polish or wax that makes floors slippery. If you must use wax, use non-skid floor wax.  Do not have throw rugs and other things on the floor that can make you trip.  What can I do with my stairs?  Do not leave any items on the stairs.  Make sure that there are handrails on both sides of the stairs and use them. Fix handrails that are broken or loose. Make sure that handrails are as long as the stairways.  Check any carpeting to make sure that it is firmly attached to the stairs. Fix any carpet that is loose or worn.  Avoid having throw rugs at the top or bottom of the stairs. If you do have throw rugs, attach them to the floor with carpet tape.  Make sure that you have a light switch at the top of the stairs and the bottom of the stairs. If you do not have them, ask someone to add them for you. What else can I do to help prevent falls?  Wear shoes that:  Do not have high heels.  Have rubber bottoms.  Are comfortable and fit you well.  Are closed at the toe. Do not wear sandals.  If you use a stepladder:  Make sure that it is fully opened. Do not climb a closed stepladder.  Make sure that both  sides of the stepladder are locked into place.  Ask someone to hold it for you, if possible.  Clearly mark and make sure that you can see:  Any grab bars or handrails.  First and last steps.  Where the edge of each step is.  Use tools that help you move around (mobility aids) if they are needed. These include:  Canes.  Walkers.  Scooters.  Crutches.  Turn on the lights when you go into a dark area. Replace any light bulbs as soon as they burn out.  Set up your furniture so you have a clear path. Avoid moving your furniture around.  If any of your floors are uneven, fix them.  If there are any pets around you, be aware of where they are.  Review your medicines with your doctor. Some medicines can make you feel dizzy. This can increase your chance of falling. Ask your doctor what other things that you can do to help prevent falls. This information is not intended to replace advice given to you by your health care provider. Make sure you discuss any questions you have with your health care provider. Document Released: 08/20/2009 Document Revised: 03/31/2016 Document Reviewed: 11/28/2014 Elsevier Interactive Patient Education  2017 [employer].

## 2018-04-04 NOTE — Progress Notes (Signed)
Patient: Bethany [name], Female    DOB: 1946-01-23, 72 y.o.   MRN: [id number] Visit Date: 04/04/2018  Today's Provider: Lelon Huh, MD   Chief Complaint  Patient presents with  . Annual Exam  . Hypertension  . Hypothyroidism  . Diabetes  . Anemia  . Hyperlipidemia   Subjective:   Patient saw McKenzie for AWV today at 1:20 pm.   Complete Physical Bethany [name] is a 72 y.o. female. She feels fairly well. She reports exercising daily. She reports she is sleeping fairly well.  -----------------------------------------------------------   Diabetes Mellitus Type II, Follow-up:   Lab Results  Component Value Date   HGBA1C 7.9 10/09/2017   HGBA1C 7.7 05/12/2017   HGBA1C 7.7 12/20/2016   Last seen for diabetes 5 months ago.  Management since then includes; started atorvastatin. She reports good compliance with treatment. She is not having side effects.  Current symptoms include none and have been stable. Home blood sugar records: fasting range: 112-125  Episodes of hypoglycemia? no   Current Insulin Regimen: none Most Recent Eye Exam: 1 year ago Weight trend: stable Prior visit with dietician: no Current diet: well balanced Current exercise: walking  ------------------------------------------------------------------------   Hypertension, follow-up:  BP Readings from Last 3 Encounters:  04/04/18 (!) 84/54  03/02/18 104/60  01/09/18 107/61    She was last seen for hypertension 4 months ago.  BP at that visit was 90/56. Management since that visit includes no changes; Hypotension had resolved .She reports good compliance with treatment. She is not having side effects.  She is exercising. She is not adherent to low salt diet.   Outside blood pressures are low per patient report. She is experiencing fatigue and palpitations.  Patient denies chest pain, chest pressure/discomfort, claudication, dyspnea, exertional chest pressure/discomfort, irregular  heart beat, lower extremity edema, near-syncope, orthopnea, paroxysmal nocturnal dyspnea, syncope and tachypnea.   Cardiovascular risk factors include advanced age (older than 59 for men, 4 for women), diabetes mellitus and hypertension.  Use of agents associated with hypertension: NSAIDS.   ------------------------------------------------------------------------    Lipid/Cholesterol, Follow-up:   Last seen for this 5 months ago.  Management since that visit includes; started atorvastatin for cardiac benefit in diabetic patient's.  Last Lipid Panel:    Component Value Date/Time   CHOL 188 05/12/2017 1014   CHOL 130 02/21/2014 0014   TRIG 110 05/12/2017 1014   TRIG 94 02/21/2014 0014   HDL 52 05/12/2017 1014   HDL 42 02/21/2014 0014   CHOLHDL 3.6 05/12/2017 1014   VLDL 19 02/21/2014 0014   LDLCALC 114 (H) 05/12/2017 1014   LDLCALC 69 02/21/2014 0014    She reports good compliance with treatment. She is not having side effects.   Wt Readings from Last 3 Encounters:  04/04/18 130 lb 3.2 oz (59.1 kg)  03/02/18 131 lb (59.4 kg)  01/09/18 135 lb 2 oz (61.3 kg)    ------------------------------------------------------------------------   Adult hypothyroidism From 10/09/2017-no changes. Today patient reports good compliance with treatment and good tolerance. Lab Results  Component Value Date   TSH 2.150 11/17/2017     Chronic systolic heart failure ([place]) From 10/09/2017-no changes. Continue aggressive risk factor modification.  She has felt a little more short of breath and fatigued lately. Is taking Entresto consistently and is tolerating well. . She is scheduled for follow up Dr. Caryl Comes and Dr. Nehemiah Massed in June.   Vitamin D deficiency From 10/09/2017-labs checked, no changes.  Lab Results  Component  Value Date   VD25OH 73 10/09/2017     Absolute Anemia From 05/12/2017-labs checked, no changes.  Lab Results  Component Value Date   WBC 3.5 (L) 11/30/2017   HGB 11.7  (L) 11/30/2017   HCT 34.7 (L) 11/30/2017   MCV 88.9 11/30/2017   PLT 114 (L) 11/30/2017       Review of Systems  Constitutional: Positive for fatigue. Negative for appetite change, chills and fever.  HENT: Positive for trouble swallowing. Negative for congestion, ear pain, rhinorrhea, sneezing and sore throat.   Eyes: Negative.  Negative for pain and redness.  Respiratory: Positive for shortness of breath (mostly when laying down). Negative for cough, chest tightness and wheezing.   Cardiovascular: Negative for chest pain, palpitations and leg swelling.  Gastrointestinal: Positive for abdominal distention and abdominal pain (cramping). Negative for blood in stool, constipation, diarrhea, nausea and vomiting.  Endocrine: Negative for polydipsia and polyphagia.  Genitourinary: Negative.  Negative for dysuria, flank pain, hematuria, pelvic pain, vaginal bleeding and vaginal discharge.  Musculoskeletal: Negative for arthralgias, back pain, gait problem and joint swelling.  Skin: Negative for rash.  Neurological: Positive for weakness (in legs). Negative for dizziness, tremors, seizures, light-headedness, numbness and headaches.  Hematological: Negative for adenopathy.  Psychiatric/Behavioral: Negative.  Negative for behavioral problems, confusion and dysphoric mood. The patient is not nervous/anxious and is not hyperactive.     Social History   Socioeconomic History  . Marital status: Divorced    Spouse name: Not on file  . Number of children: 4  . Years of education: Not on file  . Highest education level: GED or equivalent  Occupational History  . Occupation: retired  Scientific laboratory technician  . Financial resource strain: Not hard at all  . Food insecurity:    Worry: Never true    Inability: Never true  . Transportation needs:    Medical: No    Non-medical: No  Tobacco Use  . Smoking status: Never Smoker  . Smokeless tobacco: Former Systems developer    Types: Snuff  Substance and Sexual Activity   . Alcohol use: No    Alcohol/week: 0.0 oz  . Drug use: No  . Sexual activity: Not on file  Lifestyle  . Physical activity:    Days per week: Not on file    Minutes per session: Not on file  . Stress: Not at all  Relationships  . Social connections:    Talks on phone: Not on file    Gets together: Not on file    Attends religious service: Not on file    Active member of club or organization: Not on file    Attends meetings of clubs or organizations: Not on file    Relationship status: Not on file  . Intimate partner violence:    Fear of current or ex partner: Not on file    Emotionally abused: Not on file    Physically abused: Not on file    Forced sexual activity: Not on file  Other Topics Concern  . Not on file  Social History Narrative  . Not on file    Past Medical History:  Diagnosis Date  . AICD (automatic cardioverter/defibrillator) present   . Anemia   . Anxiety   . CHF (congestive heart failure) ([place])   . Complication of anesthesia   . Depression   . Diabetes ([place])   . Dilated cardiomyopathy ([place])   . Diverticulosis   . Dyspnea   . Dysrhythmia   . Edema  FEET/ANKLES OCCAS  . GERD (gastroesophageal reflux disease)   . Hyperlipidemia   . Hypothyroidism   . LV dysfunction   . Orthopnea    USES 3 PILLOWS  . PONV (postoperative nausea and vomiting)   . Reflux   . Thrombocytopenia ([place])   . Thyroid disease   . VHD (valvular heart disease)      Patient Active Problem List   Diagnosis Date Noted  . Memory loss 07/12/2016  . GERD (gastroesophageal reflux disease) 08/24/2015  . Automatic implantable cardioverter-defibrillator in situ 07/29/2015  . Hypotension 06/18/2015  . Benign essential HTN 03/20/2015  . Absolute anemia 03/11/2015  . Abnormal liver enzymes 03/11/2015  . Decreased potassium in the blood 03/11/2015  . Hypercholesteremia 03/11/2015  . Adult hypothyroidism 03/11/2015  . Idiopathic insomnia 03/11/2015  . OP (osteoporosis)  03/11/2015  . Diabetes mellitus, type 2 ([place]) 03/11/2015  . Vitamin D deficiency 03/11/2015  . LBBB (left bundle branch block) 02/11/2015  . Paroxysmal supraventricular tachycardia ([place]) 11/25/2014  . Chronic systolic heart failure ([place]) 09/25/2014  . Heart valve disease 04/10/2014    Past Surgical History:  Procedure Laterality Date  . ABDOMINAL HYSTERECTOMY    . BI-VENTRICULAR IMPLANTABLE CARDIOVERTER DEFIBRILLATOR [name] 02/11/2015   Procedure: BI-VENTRICULAR IMPLANTABLE CARDIOVERTER DEFIBRILLATOR  (CRT-D);  Surgeon: Deboraha Sprang, MD;  Location: [facility] CATH LAB;  Service: Cardiovascular;  Laterality: [name];  . BONE MARROW BIOPSY  2007   spine  . CARDIAC CATHETERIZATION    . CATARACT EXTRACTION W/PHACO Right 01/03/2017   Procedure: CATARACT EXTRACTION PHACO AND INTRAOCULAR LENS PLACEMENT (IOC);  Surgeon: Leandrew Koyanagi, MD;  Location: [facility] ORS;  Service: Ophthalmology;  Laterality: Right;  [place] 00:50 AP% 19.5 CDE 9.94 fluid pack lot # [id number]  . CATARACT EXTRACTION W/PHACO Left 10/27/2016   Procedure: CATARACT EXTRACTION PHACO AND INTRAOCULAR LENS PLACEMENT (IOC);  Surgeon: Leandrew Koyanagi, MD;  Location: [facility] ORS;  Service: Ophthalmology;  Laterality: Left;  [place]  01:12 AP%  21.0 CDE  11.59 Fluid pack lot # [id number]  . CHOLECYSTECTOMY    . INSERT / REPLACE / REMOVE PACEMAKER     AICD  . LEAD REVISION [name] 02/11/2015   Procedure: LEAD REVISION;  Surgeon: Deboraha Sprang, MD;  Location: [facility] CATH LAB;  Service: Cardiovascular;  Laterality: [name];  . PARTIAL HYSTERECTOMY  1977  . TOOTH EXTRACTION  09/23/2015    Her family history includes Diabetes in her sister; Heart attack in her brother, mother, and sister; Heart disease in her mother; Prostate cancer in her father; Stroke in her brother and sister; Ulcers in her father. There is no history of Breast cancer.      Current Outpatient Medications:  .  acetaminophen (TYLENOL) 500 MG tablet, Take 1,000 mg by mouth 2 (two) times daily as  needed for mild pain or moderate pain., Disp: , Rfl:  .  Alcohol Swabs (B-D SINGLE USE SWABS REGULAR) PADS, CHECK BLOOD SUGAR DAILY, Disp: 100 each, Rfl: 4 .  aspirin EC 81 MG tablet, Take 81 mg by mouth daily., Disp: , Rfl:  .  atorvastatin (LIPITOR) 20 MG tablet, Take 1 tablet (20 mg total) by mouth daily., Disp: 90 tablet, Rfl: 3 .  carvedilol (COREG) 6.25 MG tablet, Take 1 tablet by mouth 2 (two) times daily., Disp: , Rfl:  .  ferrous sulfate 325 (65 FE) MG tablet, Take 325 mg by mouth 2 (two) times daily with a meal., Disp: , Rfl:  .  furosemide (LASIX) 40 MG tablet, Take 20 mg by mouth  daily. , Disp: , Rfl:  .  JANUMET XR [phone] MG TB24, TAKE 1 TABLET DAILY. REPLACES JANUVIA AND METFORMIN, Disp: 90 tablet, Rfl: 3 .  mirtazapine (REMERON) 7.5 MG tablet, TAKE 1 TABLET (7.5 MG TOTAL) BY MOUTH AT BEDTIME. (DOSE CHANGE) (Patient not taking: Reported on 03/02/2018), Disp: 90 tablet, Rfl: 4 .  pantoprazole (PROTONIX) 40 MG tablet, TAKE 1 TABLET EVERY DAY, Disp: 90 tablet, Rfl: 3 .  sacubitril-valsartan (ENTRESTO) 24-26 MG, Take 1 tablet by mouth 2 (two) times daily., Disp: 180 tablet, Rfl: 3 .  senna (SENOKOT) 8.6 MG TABS tablet, Take 1 tablet by mouth daily as needed for mild constipation., Disp: , Rfl:  .  spironolactone (ALDACTONE) 25 MG tablet, Take 1 tablet (25 mg total) by mouth daily. (Patient not taking: Reported on 03/02/2018), Disp: 90 tablet, Rfl: 3 .  SYNTHROID 50 MCG tablet, TAKE 1 TABLET EVERY DAY, Disp: 90 tablet, Rfl: 3 .  TRUE METRIX BLOOD GLUCOSE TEST test strip, CHECK BLOOD SUGAR EVERY DAY, Disp: 100 each, Rfl: 5 .  TRUEPLUS LANCETS 33G MISC, CHECK BLOOD SUGAR ONE TIME A DAY, Disp: 100 each, Rfl: 4 .  Vitamin D, Ergocalciferol, (DRISDOL) 50000 units CAPS capsule, TAKE 1 CAPSULE EVERY 7 DAYS, Disp: 12 capsule, Rfl: 3  Patient Care Team: Birdie Sons, MD as PCP - General (Family Medicine) Corey Skains, MD as Consulting Physician (Cardiology) Deboraha Sprang, MD as  Consulting Physician (Cardiology) Leandrew Koyanagi, MD as Referring Physician (Ophthalmology)     Objective:   Vitals: LMP  (LMP Unknown)  Vitals:   BP 94/54 (BP Location: Right Arm)   Pulse 73   Temp 98.3 F (36.8 C) (Oral)   Ht '5\' 1"'  (1.549 m)   Wt 130 lb 3.2 oz (59.1 kg)   LMP (LMP Unknown)   BMI 24.60 kg/m   BSA 1.59 m    Physical Exam  General Appearance:    Alert, cooperative, no distress, appears stated age  Head:    Normocephalic, without obvious abnormality, atraumatic  Eyes:    PERRL, conjunctiva/corneas clear, EOM's intact, fundi    benign, both eyes  Ears:    Normal TM's and external ear canals, both ears  Nose:   Nares normal, septum midline, mucosa normal, no drainage    or sinus tenderness  Throat:   Lips, mucosa, and tongue normal; teeth and gums normal  Neck:   Supple, symmetrical, trachea midline, no adenopathy;    thyroid:  no enlargement/tenderness/nodules; no carotid   bruit or JVD  Back:     Symmetric, no curvature, ROM normal, no CVA tenderness  Lungs:     Clear to auscultation bilaterally, respirations unlabored  Chest Wall:    No tenderness or deformity   Heart:    Regular rate and rhythm, S1 and S2 normal, no murmur, rub   or gallop  Breast Exam:    normal appearance, no masses or tenderness  Abdomen:     Soft, non-tender, bowel sounds active all four quadrants,    no masses, no organomegaly  Pelvic:    deferred  Extremities:   Extremities normal, atraumatic, no cyanosis or edema  Pulses:   2+ and symmetric all extremities  Skin:   Skin color, texture, turgor normal, no rashes or lesions  Lymph nodes:   Cervical, supraclavicular, and axillary nodes normal  Neurologic:   CNII-XII intact, normal strength, sensation and reflexes    throughout   Activities of Daily Living In your present state of health,  do you have any difficulty performing the following activities: 04/04/2018 01/09/2018  Hearing? N N  Vision? N N  Difficulty  concentrating or making decisions? Tempie Donning  Walking or climbing stairs? N N  Dressing or bathing? N N  Doing errands, shopping? N N  Preparing Food and eating ? N -  Using the Toilet? N -  In the past six months, have you accidently leaked urine? Y -  Comment Occasionally, wear protection as needed.  -  Do you have problems with loss of bowel control? N -  Managing your Medications? N -  Managing your Finances? N -  Housekeeping or managing your Housekeeping? N -  Some recent data might be hidden    Fall Risk Assessment Fall Risk  04/04/2018 01/09/2018 07/12/2017 03/23/2017 01/10/2017  Falls in the past year? No No No No No     Depression Screen PHQ 2/9 Scores 04/04/2018 01/09/2018 07/12/2017 03/23/2017  PHQ - 2 Score 0 0 0 0  PHQ- 9 Score - - - 6     Assessment & Plan:    Annual Physical Reviewed patient's Family Medical History Reviewed and updated list of patient's medical providers Assessment of cognitive impairment was done Assessed patient's functional ability Established a written schedule for health screening [place] Completed and Reviewed  Exercise Activities and Dietary recommendations Goals    . DIET - EAT MORE FRUITS AND VEGETABLES     Recommend increasing fruits and vegetables in daily diet to 2-3 servings each a day.     Marland Kitchen HEMOGLOBIN A1C < 7.0    . Peak Blood Glucose < 180       Immunization History  Administered Date(s) Administered  . Influenza, High Dose Seasonal PF 07/07/2016, 10/09/2017  . Influenza,inj,Quad PF,6+ Mos 08/14/2015  . Pneumococcal Conjugate-13 09/18/2014  . Pneumococcal Polysaccharide-23 09/11/2015    Health Maintenance  Topic Date Due  . FOOT EXAM  03/15/2017  . OPHTHALMOLOGY EXAM  09/05/2017  . TETANUS/TDAP  11/07/2026 (Originally 05/08/1965)  . HEMOGLOBIN A1C  04/09/2018  . INFLUENZA VACCINE  06/07/2018  . URINE MICROALBUMIN  10/09/2018  . MAMMOGRAM  07/05/2019  . DEXA SCAN  07/05/2019  . COLONOSCOPY  05/08/2023    . Hepatitis C Screening  Completed  . PNA vac Low Risk Adult  Completed     Discussed health benefits of physical activity, and encouraged her to engage in regular exercise appropriate for her age and condition.    ------------------------------------------------------------------------------------------------------------ 1. Annual physical exam Mammogram in August. Recommend Shingrix and to check with pharmacy regarding insurance coverage.   2. Chronic systolic heart failure ([place]) Doing fairly well. Continue current medications.   3. Paroxysmal supraventricular tachycardia (H[facility])  - Lipid panel  4. Benign essential HTN Well controlled.  Continue current medications.   - Comprehensive metabolic panel - Lipid panel  5. Type 2 diabetes mellitus without complication, without long-term current use of insulin ([facility])  - Hemoglobin A1c  6. Anemia, unspecified type  - CBC  7. Vitamin D deficiency  - VITAMIN D 25 Hydroxy (Vit-D Deficiency, Fractures)  8. Adult hypothyroidism  - TSH  9. Other fatigue Check labs as above.     Lelon Huh, MD  [facility]

## 2018-04-04 NOTE — Progress Notes (Addendum)
Subjective:   Bethany [name] is a 72 y.o. female who presents for Medicare Annual (Subsequent) preventive examination.  Review of Systems:  [name]  Cardiac Risk Factors include: advanced age (>4mn, >>95women);diabetes mellitus;dyslipidemia;hypertension     Objective:     Vitals: BP (!) 84/54 (BP Location: Right Arm)   Pulse 73   Temp 98.3 F (36.8 C) (Oral)   Ht '5\' 1"'  (1.549 m)   Wt 130 lb 3.2 oz (59.1 kg)   LMP  (LMP Unknown)   BMI 24.60 kg/m   Body mass index is 24.6 kg/m.  Advanced Directives 04/04/2018 09/14/2017 07/12/2017 03/23/2017 01/10/2017 01/03/2017 07/12/2016  Does Patient Have a Medical Advance Directive? Yes No Yes Yes Yes Yes Yes  Type of AParamedicof ALa PryorLiving will - Healthcare Power of AStuartLiving will H[place]ok[place]Living will HRio Hondo Does patient want to make changes to medical advance directive? - - - No - Patient declined - - No - Patient declined  Copy of HGlencoein Chart? Yes - - No - copy requested - No - copy requested No - copy requested  Would patient like information on creating a medical advance directive? - No - Patient declined - - - - -    Tobacco Social History   Tobacco Use  Smoking Status Never Smoker  Smokeless Tobacco Former USystems developer . Types: Snuff     Counseling given: Not Answered   Clinical Intake:  Pre-visit preparation completed: Yes  Pain : No/denies pain Pain Score: 0-No pain     Nutritional Status: BMI of 19-24  Normal Nutritional Risks: None Diabetes: Yes(type 2) CBG done?: No Did pt. bring in CBG monitor from home?: No  How often do you need to have someone help you when you read instructions, pamphlets, or other written materials from your doctor or pharmacy?: 1 - Never  Interpreter Needed?: No  Information [facility] :: [facility] L[facility]  Past Medical History:    Diagnosis Date  . AICD (automatic cardioverter/defibrillator) present   . Anemia   . Anxiety   . CHF (congestive heart failure) ([place]   . Complication of anesthesia   . Depression   . Diabetes ([place]   . Dilated cardiomyopathy ([place]   . Diverticulosis   . Dyspnea   . Dysrhythmia   . Edema    FEET/ANKLES OCCAS  . GERD (gastroesophageal reflux disease)   . Hyperlipidemia   . Hypothyroidism   . LV dysfunction   . Orthopnea    USES 3 PILLOWS  . PONV (postoperative nausea and vomiting)   . Reflux   . Thrombocytopenia ([place]   . Thyroid disease   . VHD (valvular heart disease)    Past Surgical History:  Procedure Laterality Date  . ABDOMINAL HYSTERECTOMY    . BI-VENTRICULAR IMPLANTABLE CARDIOVERTER DEFIBRILLATOR [name] 02/11/2015   Procedure: BI-VENTRICULAR IMPLANTABLE CARDIOVERTER DEFIBRILLATOR  (CRT-D);  Surgeon: SDeboraha Sprang MD;  [facility];  Service: Cardiovascular;  Laterality: [name];  . BONE MARROW BIOPSY  2007   spine  . CARDIAC CATHETERIZATION    . CATARACT EXTRACTION W/PHACO Right 01/03/2017   Procedure: CATARACT EXTRACTION PHACO AND INTRAOCULAR LENS PLACEMENT (IOC);  Surgeon: CLeandrew Koyanagi MD;  Location: [facility] ORS;  Service: Ophthalmology;  Laterality: Right;  [place]00:50 AP% 19.5 CDE 9.94 fluid pack lot # [id number]  . CATARACT EXTRACTION W/PHACO Left 10/27/2016   Procedure: CATARACT EXTRACTION  PHACO AND INTRAOCULAR LENS PLACEMENT (IOC);  Surgeon: Leandrew Koyanagi, MD;  Location: [facility] ORS;  Service: Ophthalmology;  Laterality: Left;  [place]  01:12 AP%  21.0 CDE  11.59 Fluid pack lot # [id number]  . CHOLECYSTECTOMY    . INSERT / REPLACE / REMOVE PACEMAKER     AICD  . LEAD REVISION [name] 02/11/2015   Procedure: LEAD REVISION;  Surgeon: Deboraha Sprang, MD;  [facility] LAB;  Service: Cardiovascular;  Laterality: [name];  . PARTIAL HYSTERECTOMY  1977  . TOOTH EXTRACTION  09/23/2015   Family History  Problem Relation Age of Onset  . Heart disease Mother   .  Heart attack Mother   . Prostate cancer Father   . Ulcers Father   . Stroke Sister   . Heart attack Sister   . Diabetes Sister   . Heart attack Brother   . Stroke Brother   . Breast cancer Neg Hx    Social History   Socioeconomic History  . Marital status: Divorced    Spouse name: Not on file  . Number of children: 4  . Years of education: Not on file  . Highest education level: GED or equivalent  Occupational History  . Occupation: retired  Scientific laboratory technician  . Financial resource strain: Not hard at all  . Food insecurity:    Worry: Never true    Inability: Never true  . Transportation needs:    Medical: No    Non-medical: No  Tobacco Use  . Smoking status: Never Smoker  . Smokeless tobacco: Former Systems developer    Types: Snuff  Substance and Sexual Activity  . Alcohol use: No    Alcohol/week: 0.0 oz  . Drug use: No  . Sexual activity: Not on file  Lifestyle  . Physical activity:    Days per week: Not on file    Minutes per session: Not on file  . Stress: Not at all  Relationships  . Social connections:    Talks on phone: Not on file    Gets together: Not on file    Attends religious service: Not on file    Active member of club or organization: Not on file    Attends meetings of clubs or organizations: Not on file    Relationship status: Not on file  Other Topics Concern  . Not on file  Social History Narrative  . Not on file    Outpatient Encounter Medications as of 04/04/2018  Medication Sig  . acetaminophen (TYLENOL) 500 MG tablet Take 1,000 mg by mouth 2 (two) times daily as needed for mild pain or moderate pain.  . Alcohol Swabs (B-D SINGLE USE SWABS REGULAR) PADS CHECK BLOOD SUGAR DAILY  . aspirin EC 81 MG tablet Take 81 mg by mouth daily.  Marland Kitchen atorvastatin (LIPITOR) 20 MG tablet Take 1 tablet (20 mg total) by mouth daily.  . carvedilol (COREG) 6.25 MG tablet Take 1 tablet by mouth 2 (two) times daily.  . ferrous sulfate 325 (65 FE) MG tablet Take 325 mg by mouth  2 (two) times daily with a meal.  . furosemide (LASIX) 40 MG tablet Take 20 mg by mouth daily.   Marland Kitchen JANUMET XR [phone] MG TB24 TAKE 1 TABLET DAILY. REPLACES JANUVIA AND METFORMIN  . pantoprazole (PROTONIX) 40 MG tablet TAKE 1 TABLET EVERY DAY  . sacubitril-valsartan (ENTRESTO) 24-26 MG Take 1 tablet by mouth 2 (two) times daily.  Marland Kitchen senna (SENOKOT) 8.6 MG TABS tablet Take 1 tablet by mouth  daily as needed for mild constipation.  Marland Kitchen SYNTHROID 50 MCG tablet TAKE 1 TABLET EVERY DAY  . TRUE METRIX BLOOD GLUCOSE TEST test strip CHECK BLOOD SUGAR EVERY DAY  . TRUEPLUS LANCETS 33G MISC CHECK BLOOD SUGAR ONE TIME A DAY  . Vitamin D, Ergocalciferol, (DRISDOL) 50000 units CAPS capsule TAKE 1 CAPSULE EVERY 7 DAYS  . mirtazapine (REMERON) 7.5 MG tablet TAKE 1 TABLET (7.5 MG TOTAL) BY MOUTH AT BEDTIME. (DOSE CHANGE) (Patient not taking: Reported on 03/02/2018)  . spironolactone (ALDACTONE) 25 MG tablet Take 1 tablet (25 mg total) by mouth daily. (Patient not taking: Reported on 03/02/2018)   No facility-administered encounter medications on file as of 04/04/2018.     Activities of Daily Living In your present state of health, do you have any difficulty performing the following activities: 04/04/2018 01/09/2018  Hearing? N N  Vision? N N  Difficulty concentrating or making decisions? Tempie Donning  Walking or climbing stairs? N N  Dressing or bathing? N N  Doing errands, shopping? N N  Preparing Food and eating ? N -  Using the Toilet? N -  In the past six months, have you accidently leaked urine? Y -  Comment Occasionally, wear protection as needed.  -  Do you have problems with loss of bowel control? N -  Managing your Medications? N -  Managing your Finances? N -  Housekeeping or managing your Housekeeping? N -  Some recent data might be hidden    Patient Care Team: Birdie Sons, MD as PCP - General (Family Medicine) Corey Skains, MD as Consulting Physician (Cardiology) Deboraha Sprang, MD as  Consulting Physician (Cardiology) Leandrew Koyanagi, MD as Referring Physician (Ophthalmology)    Assessment:   This is a routine wellness examination for Marleen.  Exercise Activities and Dietary recommendations Current Exercise Habits: The patient does not participate in regular exercise at present(walks a lot with job), Exercise limited by: Other - see comments(no energy)  Goals    . DIET - EAT MORE FRUITS AND VEGETABLES     Recommend increasing fruits and vegetables in daily diet to 2-3 servings each a day.     Marland Kitchen HEMOGLOBIN A1C < 7.0    . Peak Blood Glucose < 180       Fall Risk Fall Risk  04/04/2018 01/09/2018 07/12/2017 03/23/2017 01/10/2017  Falls in the past year? No No No No No   Is the patient's home free of loose throw rugs in walkways, pet beds, electrical cords, etc?   yes      Grab bars in the bathroom? yes      Handrails on the stairs?   yes      Adequate lighting?   yes  Timed Get Up and Go performed: [name]  Depression Screen PHQ 2/9 Scores 04/04/2018 01/09/2018 07/12/2017 03/23/2017  PHQ - 2 Score 0 0 0 0  PHQ- 9 Score - - - 6     Cognitive Function:      6CIT Screen 04/04/2018 03/23/2017  What Year? 0 points 0 points  What month? 0 points 0 points  What time? 0 points 0 points  Count back from 20 0 points 0 points  Months in reverse 0 points 0 points  Repeat phrase 6 points 6 points  Total Score 6 6    Immunization History  Administered Date(s) Administered  . Influenza, High Dose Seasonal PF 07/07/2016, 10/09/2017  . Influenza,inj,Quad PF,6+ Mos 08/14/2015  . Pneumococcal Conjugate-13 09/18/2014  . Pneumococcal  Polysaccharide-23 09/11/2015    Qualifies for Shingles Vaccine? Due for Shingles vaccine. Declined my offer to administer today. Education has been provided regarding the importance of this vaccine. Pt has been advised to call her insurance company to determine her out of pocket expense. Advised she may also receive this vaccine at her local pharmacy  or Health Dept. Verbalized acceptance and understanding.  Screening Tests Health Maintenance  Topic Date Due  . FOOT EXAM  03/15/2017  . OPHTHALMOLOGY EXAM  09/05/2017  . TETANUS/TDAP  11/07/2026 (Originally 05/08/1965)  . HEMOGLOBIN A1C  04/09/2018  . INFLUENZA VACCINE  06/07/2018  . URINE MICROALBUMIN  10/09/2018  . MAMMOGRAM  07/05/2019  . DEXA SCAN  07/05/2019  . COLONOSCOPY  05/08/2023  . Hepatitis C Screening  Completed  . PNA vac Low Risk Adult  Completed    Cancer Screenings: Lung: Low Dose CT Chest recommended if Age 18-80 years, 30 pack-year currently smoking OR have quit w/in 15years. Patient does not qualify. Breast:  Up to date on Mammogram? Yes   Up to date of Bone Density/Dexa? Yes Colorectal: Up to date  Additional Screenings:  Hepatitis C Screening: Up to date     Plan:  I have personally reviewed and addressed the Medicare Annual Wellness questionnaire and have noted the following in the patient's chart:  A. Medical and social history B. Use of alcohol, tobacco or illicit drugs  C. Current medications and supplements D. Functional ability and status E.  Nutritional status F.  Physical activity G. Advance directives H. List of other physicians I.  Hospitalizations, surgeries, and ER visits in previous 12 months J.  [place] such as hearing and vision if needed, cognitive and depression L. Referrals and appointments - none  In addition, I have reviewed and discussed with patient certain preventive protocols, quality metrics, and best practice recommendations. A written personalized care plan for preventive services as well as general preventive health recommendations were provided to patient.  See attached scanned questionnaire for additional information.   Signed,  Fabio Neighbors, LPN Nurse Health Advisor   Nurse Recommendations: Pt needs a diabetic foot exam completed today. Will retrieve last DM eye exam from [place].

## 2018-04-05 LAB — LIPID PANEL
Chol/HDL Ratio: 2.1 ratio (ref 0.0–4.4)
Cholesterol, Total: 115 mg/dL (ref 100–199)
HDL: 54 mg/dL (ref >39)
LDL Calculated: 41 mg/dL (ref 0–99)
Triglycerides: 100 mg/dL (ref 0–149)
VLDL Cholesterol Cal: 20 mg/dL (ref 5–40)

## 2018-04-05 LAB — CUP PACEART REMOTE DEVICE CHECK
Battery Voltage: 2.95 V
Brady Statistic AP VP Percent: 0.02 %
Brady Statistic AS VP Percent: 98.69 %
Brady Statistic RA Percent Paced: 0.03 %
Date Time Interrogation Session: 20190529062824
HIGH POWER IMPEDANCE MEASURED VALUE: 53 Ohm
Implantable Lead Implant Date: 20160409
Implantable Lead Implant Date: 20160409
Implantable Lead Location: 753858
Implantable Lead Model: 5076
Implantable Pulse Generator Implant Date: 20160409
Lead Channel Impedance Value: 1026 Ohm
Lead Channel Impedance Value: 304 Ohm
Lead Channel Impedance Value: 399 Ohm
Lead Channel Impedance Value: 456 Ohm
Lead Channel Impedance Value: 475 Ohm
Lead Channel Impedance Value: 703 Ohm
Lead Channel Impedance Value: 779 Ohm
Lead Channel Impedance Value: 836 Ohm
Lead Channel Impedance Value: 836 Ohm
Lead Channel Impedance Value: 988 Ohm
Lead Channel Pacing Threshold Amplitude: 0.25 V
Lead Channel Pacing Threshold Amplitude: 0.75 V
Lead Channel Pacing Threshold Pulse Width: 0.4 ms
Lead Channel Pacing Threshold Pulse Width: 0.4 ms
Lead Channel Pacing Threshold Pulse Width: 0.8 ms
Lead Channel Sensing Intrinsic Amplitude: 1.25 mV
Lead Channel Sensing Intrinsic Amplitude: 10.125 mV
Lead Channel Setting Pacing Amplitude: 1.5 V
Lead Channel Setting Pacing Pulse Width: 0.8 ms
Lead Channel Setting Sensing Sensitivity: 0.3 mV
MDC IDC LEAD IMPLANT DT: 20160409
MDC IDC LEAD LOCATION: 753859
MDC IDC LEAD LOCATION: 753860
MDC IDC MSMT BATTERY REMAINING LONGEVITY: 39 mo
MDC IDC MSMT LEADCHNL LV IMPEDANCE VALUE: 1007 Ohm
MDC IDC MSMT LEADCHNL LV IMPEDANCE VALUE: 532 Ohm
MDC IDC MSMT LEADCHNL LV PACING THRESHOLD AMPLITUDE: 1.875 V
MDC IDC MSMT LEADCHNL RA SENSING INTR AMPL: 1.25 mV
MDC IDC MSMT LEADCHNL RV IMPEDANCE VALUE: 475 Ohm
MDC IDC MSMT LEADCHNL RV SENSING INTR AMPL: 10.125 mV
MDC IDC SET LEADCHNL LV PACING AMPLITUDE: 2.5 V
MDC IDC SET LEADCHNL RV PACING AMPLITUDE: 2 V
MDC IDC SET LEADCHNL RV PACING PULSEWIDTH: 0.4 ms
MDC IDC STAT BRADY AP VS PERCENT: 0.01 %
MDC IDC STAT BRADY AS VS PERCENT: 1.28 %
MDC IDC STAT BRADY RV PERCENT PACED: 3 %

## 2018-04-05 LAB — HEMOGLOBIN A1C
Est. average glucose Bld gHb Est-mCnc: 189 mg/dL
HEMOGLOBIN A1C: 8.2 % — AB (ref 4.8–5.6)

## 2018-04-05 LAB — COMPREHENSIVE METABOLIC PANEL
A/G RATIO: 1.4 (ref 1.2–2.2)
ALK PHOS: 90 IU/L (ref 39–117)
ALT: 11 IU/L (ref 0–32)
AST: 17 IU/L (ref 0–40)
Albumin: 4 g/dL (ref 3.5–4.8)
BUN/Creatinine Ratio: 9 — ABNORMAL LOW (ref 12–28)
BUN: 7 mg/dL — ABNORMAL LOW (ref 8–27)
Bilirubin Total: 0.3 mg/dL (ref 0.0–1.2)
CO2: 23 mmol/L (ref 20–29)
Calcium: 9.6 mg/dL (ref 8.7–10.3)
Chloride: 104 mmol/L (ref 96–106)
Creatinine, Ser: 0.75 mg/dL (ref 0.57–1.00)
GFR calc non Af Amer: 80 mL/min/{1.73_m2} (ref >59)
GFR, EST AFRICAN AMERICAN: 93 mL/min/{1.73_m2} (ref >59)
GLOBULIN, TOTAL: 2.8 g/dL (ref 1.5–4.5)
Glucose: 129 mg/dL — ABNORMAL HIGH (ref 65–99)
Potassium: 3.8 mmol/L (ref 3.5–5.2)
SODIUM: 143 mmol/L (ref 134–144)
TOTAL PROTEIN: 6.8 g/dL (ref 6.0–8.5)

## 2018-04-05 LAB — CBC
Hematocrit: 34.2 % (ref 34.0–46.6)
Hemoglobin: 10.9 g/dL — ABNORMAL LOW (ref 11.1–15.9)
MCH: 28.4 pg (ref 26.6–33.0)
MCHC: 31.9 g/dL (ref 31.5–35.7)
MCV: 89 fL (ref 79–97)
PLATELETS: 122 10*3/uL — AB (ref 150–450)
RBC: 3.84 x10E6/uL (ref 3.77–5.28)
RDW: 13.7 % (ref 12.3–15.4)
WBC: 3.4 10*3/uL (ref 3.4–10.8)

## 2018-04-05 LAB — TSH: TSH: 1.79 u[IU]/mL (ref 0.450–4.500)

## 2018-04-05 LAB — VITAMIN D 25 HYDROXY (VIT D DEFICIENCY, FRACTURES): VIT D 25 HYDROXY: 69.8 ng/mL (ref 30.0–100.0)

## 2018-04-10 ENCOUNTER — Encounter: Payer: Self-pay | Admitting: Internal Medicine

## 2018-04-10 ENCOUNTER — Ambulatory Visit (INDEPENDENT_AMBULATORY_CARE_PROVIDER_SITE_OTHER): Payer: Medicare PPO | Admitting: Internal Medicine

## 2018-04-10 VITALS — BP 96/60 | HR 70 | Ht 61.0 in | Wt 129.5 lb

## 2018-04-10 DIAGNOSIS — I5022 Chronic systolic (congestive) heart failure: Secondary | ICD-10-CM | POA: Diagnosis not present

## 2018-04-10 DIAGNOSIS — Z95810 Presence of automatic (implantable) cardiac defibrillator: Secondary | ICD-10-CM | POA: Diagnosis not present

## 2018-04-10 DIAGNOSIS — I428 Other cardiomyopathies: Secondary | ICD-10-CM | POA: Diagnosis not present

## 2018-04-10 DIAGNOSIS — I447 Left bundle-branch block, unspecified: Secondary | ICD-10-CM | POA: Diagnosis not present

## 2018-04-10 MED ORDER — SACUBITRIL-VALSARTAN 24-26 MG PO TABS: 0.5000 | ORAL_TABLET | Freq: Two times a day (BID) | ORAL | Status: DC

## 2018-04-10 NOTE — Patient Instructions (Signed)
Medication Instructions: - Your physician has recommended you make the following change in your medication:   1) DECREASE entresto 24/26 mg- take 1/2 tablet (12/13 mg) by mouth TWICE daily 2) DECREASE coreg (carvedilol) 6.25 mg- take 1/2 tablet (3.125 mg) by mouth TWICE daily  Labwork: - none ordered  Procedures/Testing: - none ordered  Follow-Up: - Remote monitoring is used to monitor your Pacemaker of ICD from home. This monitoring reduces the number of office visits required to check your device to one time per year. It allows [place] to keep an eye on the functioning of your device to ensure it is working properly. You are scheduled for a device check from home on 07/04/18. You may send your transmission at any time that day. If you have a wireless device, the transmission will be sent automatically. After your physician reviews your transmission, you will receive a postcard with your next transmission date.  - Your physician wants you to follow-up in: 1 year with Dr. Graciela Husbands. You will receive a reminder letter in the mail two months in advance. If you don't receive a letter, please call our office to schedule the follow-up appointment.   Any Additional Special Instructions Will Be Listed Below (If Applicable).     If you need a refill on your cardiac medications before your next appointment, please call your pharmacy.

## 2018-04-10 NOTE — Progress Notes (Signed)
ELECTROPHYSIOLOGY Clinic  NOTE  Patient ID: Bethany [name], MRN: [id number], DOB/AGE: Dec 31, 1945 72 y.o. Admit date: (Not on file) Date of Consult: 04/10/2018  Primary Physician: Birdie Sons, MD Primary Cardiologist: BK   Chief Complaint: Defibrillator   HPI Bethany [name] is a 72 y.o. female   in follow-up for CRT-D implantation 4/16.  She has a history of a nonischemic cardiomyopathy;    DATE TEST EF   2012 Echo  20-25%   2012 LHC  Normal CAs  3/16 Echo   15-20 %   3/17 Echo  35%      She has chronic systolic heart failure; has been euvolemic of late.  Her biggest complaint is fatigue.  She has a history of hypertension but recently they have had to undergo progressive drug down titration.  She has some lightheadedness with standing.  She lives alone and does not know whether she snores.   Date  Cr K  5/19 .75 3.8        Date WBC Hgb Plt  2/18 3.0 11.5 95  5/19 3.4 10.9 122    HF clinic notes reviewed   Past Medical History:  Diagnosis Date  . AICD (automatic cardioverter/defibrillator) present   . Anemia   . Anxiety   . CHF (congestive heart failure) ([place])   . Complication of anesthesia   . Depression   . Diabetes ([place])   . Dilated cardiomyopathy ([place])   . Diverticulosis   . Dyspnea   . Dysrhythmia   . Edema    FEET/ANKLES OCCAS  . GERD (gastroesophageal reflux disease)   . Hyperlipidemia   . Hypothyroidism   . LV dysfunction   . Orthopnea    USES 3 PILLOWS  . PONV (postoperative nausea and vomiting)   . Reflux   . Thrombocytopenia ([place])   . Thyroid disease   . VHD (valvular heart disease)       Surgical History:  Past Surgical History:  Procedure Laterality Date  . ABDOMINAL HYSTERECTOMY    . BI-VENTRICULAR IMPLANTABLE CARDIOVERTER DEFIBRILLATOR [name] 02/11/2015   Procedure: BI-VENTRICULAR IMPLANTABLE CARDIOVERTER DEFIBRILLATOR  (CRT-D);  Surgeon: Deboraha Sprang, MD;  Location: [facility] CATH LAB;  Service: Cardiovascular;  Laterality: [name];  .  BONE MARROW BIOPSY  2007   spine  . CARDIAC CATHETERIZATION    . CATARACT EXTRACTION W/PHACO Right 01/03/2017   Procedure: CATARACT EXTRACTION PHACO AND INTRAOCULAR LENS PLACEMENT (IOC);  Surgeon: Leandrew Koyanagi, MD;  Location: [facility] ORS;  Service: Ophthalmology;  Laterality: Right;  [place] 00:50 AP% 19.5 CDE 9.94 fluid pack lot # [id number]  . CATARACT EXTRACTION W/PHACO Left 10/27/2016   Procedure: CATARACT EXTRACTION PHACO AND INTRAOCULAR LENS PLACEMENT (IOC);  Surgeon: Leandrew Koyanagi, MD;  Location: [facility] ORS;  Service: Ophthalmology;  Laterality: Left;  [place]  01:12 AP%  21.0 CDE  11.59 Fluid pack lot # [id number]  . CHOLECYSTECTOMY    . INSERT / REPLACE / REMOVE PACEMAKER     AICD  . LEAD REVISION [name] 02/11/2015   Procedure: LEAD REVISION;  Surgeon: Deboraha Sprang, MD;  Location: [facility] CATH LAB;  Service: Cardiovascular;  Laterality: [name];  . PARTIAL HYSTERECTOMY  1977  . TOOTH EXTRACTION  09/23/2015     Home Meds: Prior to Admission medications   Medication Sig Start Date End Date Taking? Authorizing Provider  escitalopram (LEXAPRO) 10 MG tablet Take 1 tablet by mouth daily. 01/02/15  Yes Historical Provider, MD  furosemide (LASIX) 20 MG tablet Take 1 tablet by  mouth daily. 09/22/14 09/22/15 Yes Historical Provider, MD  IRON PO Take 1 tablet by mouth daily.   Yes Historical Provider, MD  levothyroxine (SYNTHROID, LEVOTHROID) 50 MCG tablet Take 1 tablet by mouth daily.   Yes Historical Provider, MD  metFORMIN (GLUCOPHAGE) 500 MG tablet Take 1 tablet by mouth 2 (two) times daily. 06/12/14  Yes Historical Provider, MD  metoprolol tartrate (LOPRESSOR) 25 MG tablet Take 12.5 mg by mouth 2 (two) times daily.   Yes Historical Provider, MD  pantoprazole (PROTONIX) 40 MG tablet Take 1 tablet by mouth daily.   Yes Historical Provider, MD  spironolactone (ALDACTONE) 25 MG tablet Take 1 tablet by mouth 3 (three) times a week.   Yes Historical Provider, MD      Allergies:  Allergies  Allergen  Reactions  . Other Other (See Comments)    NO BLOOD PRODUCTS- JEHOVAHS WITNESS  . Sulfa Antibiotics Other (See Comments)    Loss of taste    Social History   Socioeconomic History  . Marital status: Divorced    Spouse name: Not on file  . Number of children: 4  . Years of education: Not on file  . Highest education level: GED or equivalent  Occupational History  . Occupation: retired  Scientific laboratory technician  . Financial resource strain: Not hard at all  . Food insecurity:    Worry: Never true    Inability: Never true  . Transportation needs:    Medical: No    Non-medical: No  Tobacco Use  . Smoking status: Never Smoker  . Smokeless tobacco: Former Systems developer    Types: Snuff  Substance and Sexual Activity  . Alcohol use: No    Alcohol/week: 0.0 oz  . Drug use: No  . Sexual activity: Not on file  Lifestyle  . Physical activity:    Days per week: Not on file    Minutes per session: Not on file  . Stress: Not at all  Relationships  . Social connections:    Talks on phone: Not on file    Gets together: Not on file    Attends religious service: Not on file    Active member of club or organization: Not on file    Attends meetings of clubs or organizations: Not on file    Relationship status: Not on file  . Intimate partner violence:    Fear of current or ex partner: Not on file    Emotionally abused: Not on file    Physically abused: Not on file    Forced sexual activity: Not on file  Other Topics Concern  . Not on file  Social History Narrative  . Not on file     Family History  Problem Relation Age of Onset  . Heart disease Mother   . Heart attack Mother   . Prostate cancer Father   . Ulcers Father   . Stroke Sister   . Heart attack Sister   . Diabetes Sister   . Heart attack Brother   . Stroke Brother   . Breast cancer Neg Hx      ROS:  Please see the history of present illness.     All other systems reviewed and negative.    Physical Exam:   Blood pressure  96/60, pulse 70, height '5\' 1"'  (1.549 m), weight 129 lb 8 oz (58.7 kg). Well developed and nourished in no acute distress HENT normal Neck supple with JVP-flat Clear Regular rate and rhythm, no murmurs or gallops Abd-soft  with active BS No Clubbing cyanosis edema Skin-warm and dry A & Oriented  Grossly normal sensory and motor function     Labs: Cardiac Enzymes No results for input(s): CKTOTAL, CKMB, TROPONINI in the last 72 hours. CBC   Miscellaneous No results found for: DDIMER  Radiology/Studies:  No results found.  EKG: P synchronous pacing with a negative QRS lead I and rS in lead V1  Assessment and Plan:   NICM  CHF systolic chronic/acute class 2B   LBBB  Hypotension/dizziness  Fatigue  CRT D implanted 4/16   I wonder whether her fatigue is largely related to her low blood pressure.  This is been seen in the past.  We will plan to have her cut her Aldactone as well as her Entresto in half.  She will follow-up with Dr. Helane Gunther.  We will see her again in 1 year    Virl Axe

## 2018-04-16 DIAGNOSIS — I5022 Chronic systolic (congestive) heart failure: Secondary | ICD-10-CM | POA: Diagnosis not present

## 2018-04-16 DIAGNOSIS — I952 Hypotension due to drugs: Secondary | ICD-10-CM | POA: Diagnosis not present

## 2018-04-16 DIAGNOSIS — Z95810 Presence of automatic (implantable) cardiac defibrillator: Secondary | ICD-10-CM | POA: Diagnosis not present

## 2018-05-11 ENCOUNTER — Telehealth: Payer: Self-pay

## 2018-05-11 NOTE — Telephone Encounter (Signed)
No other suggestions, should improve over the weekend. Be sure 4-5 glasses of water a day in addition to Glucerna. Call or go to ER if any dizzy spells or light headedness.

## 2018-05-11 NOTE — Telephone Encounter (Signed)
Patient advised and verbally voiced understanding. Patient also wants to know if its ok to take OTC Aleve for the oral pain. She says the Dentist is closed so she wants able to call and ask them.

## 2018-05-11 NOTE — Telephone Encounter (Signed)
Just Tylenol, aleve is not good for her heart.

## 2018-05-11 NOTE — Telephone Encounter (Signed)
Patient advised and verbally voiced understanding.  

## 2018-05-11 NOTE — Telephone Encounter (Signed)
Pt states she has not been able to eat solid foods since having dental work on 05/09/2018. She feels weak, especially in her legs. She is consuming Glucerna shakes. She does not have a blender to make smoothies. She has tried eating mashed potatoes, which has not improved weakness. I advised her to try soups and low sugar yogurt. She agrees to try these. Do you have any other suggestions?

## 2018-05-18 ENCOUNTER — Ambulatory Visit: Payer: Medicare PPO | Admitting: Family Medicine

## 2018-05-18 VITALS — BP 84/58 | HR 76 | Temp 98.3°F | Resp 16 | Wt 127.0 lb

## 2018-05-18 DIAGNOSIS — R531 Weakness: Secondary | ICD-10-CM | POA: Diagnosis not present

## 2018-05-18 DIAGNOSIS — R630 Anorexia: Secondary | ICD-10-CM | POA: Diagnosis not present

## 2018-05-18 DIAGNOSIS — R110 Nausea: Secondary | ICD-10-CM | POA: Diagnosis not present

## 2018-05-18 MED ORDER — CYPROHEPTADINE HCL 4 MG PO TABS: 4.0000 mg | ORAL_TABLET | Freq: Three times a day (TID) | ORAL | 0 refills | Status: DC | PRN

## 2018-05-18 NOTE — Progress Notes (Signed)
Patient: Bethany [name] Female    DOB: 10/20/1946   72 y.o.   MRN: [id number] Visit Date: 05/18/2018  Today's Provider: [name], MD   Chief Complaint  Patient presents with  . Anorexia   Subjective:    HPI  Pt comes in today complaining of loss of appetite, fatigue, weakness.  She reports she has not been able to eat solid foods since the beginning of June secondary to tooth pain.  She had  a root canal 05/02/2018; is currently on Clindamycin 300mg  every six hours which the dentist prescribed on 05-14-2018,  and is also taking Acetaminophen/Codeine 300-30mg  as needed for pain, although she has not taken today.  Pt reports she is still not able to tolerate solid foods.  She can eat soft foods that does not require a lot of chewing she is also drinking glucerna three times a day.  She also complains of Nausea after she eats.   She was having diarrhea, but this resolved several days ago.   Pt says her Blood Pressure has been running low.    Wt Readings from Last 3 Encounters:  05/18/18 127 lb (57.6 kg)  04/10/18 129 lb 8 oz (58.7 kg)  04/04/18 130 lb 3.2 oz (59.1 kg)    She brinks in record of her BP and glucose, with her SBP being all above 90 and mostly in the 100s, and her sugar being in 80s to 110s.     Allergies  Allergen Reactions  . Other Other (See Comments)    NO BLOOD PRODUCTS- JEHOVAHS WITNESS  . Sulfa Antibiotics Other (See Comments)    Loss of taste     Current Outpatient Medications:  .  acetaminophen (TYLENOL) 500 MG tablet, Take 1,000 mg by mouth 2 (two) times daily as needed for mild pain or moderate pain., Disp: , Rfl:  .  acetaminophen-codeine (TYLENOL #3) 300-30 MG tablet, TAKE 1 TABLET BY MOUTH EVERY 4 HOURS FOR PAIN RELIEF., Disp: , Rfl: 0 .  Alcohol Swabs (B-D SINGLE USE SWABS REGULAR) PADS, CHECK BLOOD SUGAR DAILY, Disp: 100 each, Rfl: 4 .  aspirin EC 81 MG tablet, Take 81 mg by mouth daily., Disp: , Rfl:  .  atorvastatin (LIPITOR) 20 MG  tablet, Take 1 tablet (20 mg total) by mouth daily., Disp: 90 tablet, Rfl: 3 .  carvedilol (COREG) 6.25 MG tablet, Take 1/2 tablet (3.125 mg) by mouth TWICE daily, Disp: , Rfl:  .  clindamycin (CLEOCIN) 300 MG capsule, TAKE 1 CAPSULE BY MOUTH EVERY 6 HOURS FOR INFECTION UNTIL GONE, Disp: , Rfl: 0 .  ferrous sulfate 325 (65 FE) MG tablet, Take 325 mg by mouth 2 (two) times daily with a meal., Disp: , Rfl:  .  furosemide (LASIX) 40 MG tablet, Take 20 mg by mouth daily. , Disp: , Rfl:  .  JANUMET XR [phone] MG TB24, TAKE 1 TABLET DAILY. REPLACES JANUVIA AND METFORMIN, Disp: 90 tablet, Rfl: 3 .  pantoprazole (PROTONIX) 40 MG tablet, TAKE 1 TABLET EVERY DAY, Disp: 90 tablet, Rfl: 3 .  sacubitril-valsartan (ENTRESTO) 24-26 MG, Take 0.5 tablets by mouth 2 (two) times daily. (Patient taking differently: Take 1 tablet by mouth 2 (two) times daily. ), Disp: , Rfl:  .  senna (SENOKOT) 8.6 MG TABS tablet, Take 1 tablet by mouth daily as needed for mild constipation., Disp: , Rfl:  .  SYNTHROID 50 MCG tablet, TAKE 1 TABLET EVERY DAY, Disp: 90 tablet, Rfl: 3 .  TRUE METRIX BLOOD GLUCOSE TEST test strip, CHECK BLOOD SUGAR EVERY DAY, Disp: 100 each, Rfl: 3 .  TRUEPLUS LANCETS 33G MISC, CHECK BLOOD SUGAR ONE TIME A DAY, Disp: 100 each, Rfl: 4 .  Vitamin D, Ergocalciferol, (DRISDOL) 50000 units CAPS capsule, TAKE 1 CAPSULE EVERY 7 DAYS, Disp: 12 capsule, Rfl: 3   Review of Systems  Constitutional: Positive for appetite change and fatigue. Negative for activity change, chills, diaphoresis, fever and unexpected weight change.  Respiratory: Negative.   Cardiovascular: Negative.   Gastrointestinal: Positive for abdominal distention and nausea. Negative for abdominal pain, anal bleeding, blood in stool, constipation, diarrhea, rectal pain and vomiting.  Neurological: Positive for dizziness, weakness and light-headedness. Negative for headaches.    Social History   Tobacco Use  . Smoking status: Never Smoker  .  Smokeless tobacco: Former Neurosurgeon    Types: Snuff  Substance Use Topics  . Alcohol use: No    Alcohol/week: 0.0 oz   Objective:   LMP  (LMP Unknown)  Vitals:   05/18/18 1039  BP: (!) 84/58  Pulse: 76  Resp: 16  Temp: 98.3 F (36.8 C)  TempSrc: Oral  Weight: 127 lb (57.6 kg)   BP Readings from Last 3 Encounters:  05/18/18 (!) 84/58  04/10/18 96/60  04/04/18 (!) 84/54      Physical Exam   General Appearance:    Alert, cooperative, no distress  Eyes:    PERRL, conjunctiva/corneas clear, EOM's intact       Lungs:     Clear to auscultation bilaterally, respirations unlabored  Heart:    Regular rate and rhythm  Neurologic:   Awake, alert, oriented x 3. No apparent focal neurological           defect.          Assessment & Plan:     1. Weakness Due to poor oral intake the last few weeks, but does not appear dehydrated and is HD stable. check labs to ensure normal nutritional state and electrolytes.  Low BP exacerbated by Sherryll Burger. Will reduce Entresto back to 1/2 tablet BID for the next wek.  - CBC - Comprehensive metabolic panel  2. Loss of appetite Multifactorial, likely some contribution my metformin, codeine and antibiotic. Put Janumet on hold for a week. Given prescription for cycloheptadine, but advised not to fill unless her appetite does not improve over the weekend.   3. Nausea Expect improvement off metformin and once she gets more solid food in her stomach. Advised that codeine also often causes nausea.        [name], MD  [facility]

## 2018-05-18 NOTE — Patient Instructions (Addendum)
   Stop taking Janumet for one week, then start back on one tablet a day   Reduce Entresto to 1/2 tablet twice a day for one week, then start back on 1 full tablet twice a day   Start peri-actin if your appetite doesn't improve in the next day or two

## 2018-05-19 LAB — COMPREHENSIVE METABOLIC PANEL
A/G RATIO: 2 (ref 1.2–2.2)
ALT: 40 IU/L — ABNORMAL HIGH (ref 0–32)
AST: 50 IU/L — ABNORMAL HIGH (ref 0–40)
Albumin: 4.5 g/dL (ref 3.5–4.8)
Alkaline Phosphatase: 79 IU/L (ref 39–117)
BUN/Creatinine Ratio: 13 (ref 12–28)
BUN: 9 mg/dL (ref 8–27)
Bilirubin Total: 0.4 mg/dL (ref 0.0–1.2)
CALCIUM: 9.7 mg/dL (ref 8.7–10.3)
CO2: 24 mmol/L (ref 20–29)
Chloride: 94 mmol/L — ABNORMAL LOW (ref 96–106)
Creatinine, Ser: 0.71 mg/dL (ref 0.57–1.00)
GFR calc Af Amer: 98 mL/min/{1.73_m2} (ref >59)
GFR, EST NON AFRICAN AMERICAN: 85 mL/min/{1.73_m2} (ref >59)
GLOBULIN, TOTAL: 2.3 g/dL (ref 1.5–4.5)
Glucose: 113 mg/dL — ABNORMAL HIGH (ref 65–99)
POTASSIUM: 3.9 mmol/L (ref 3.5–5.2)
Sodium: 135 mmol/L (ref 134–144)
Total Protein: 6.8 g/dL (ref 6.0–8.5)

## 2018-05-19 LAB — CBC
Hematocrit: 32.5 % — ABNORMAL LOW (ref 34.0–46.6)
Hemoglobin: 10.9 g/dL — ABNORMAL LOW (ref 11.1–15.9)
MCH: 29.3 pg (ref 26.6–33.0)
MCHC: 33.5 g/dL (ref 31.5–35.7)
MCV: 87 fL (ref 79–97)
Platelets: 137 10*3/uL — ABNORMAL LOW (ref 150–450)
RBC: 3.72 x10E6/uL — ABNORMAL LOW (ref 3.77–5.28)
RDW: 13.4 % (ref 12.3–15.4)
WBC: 3.1 10*3/uL — AB (ref 3.4–10.8)

## 2018-05-22 ENCOUNTER — Telehealth: Payer: Self-pay | Admitting: *Deleted

## 2018-05-22 NOTE — Telephone Encounter (Signed)
Pt returned call ° °Thanks  teri °

## 2018-05-22 NOTE — Telephone Encounter (Signed)
LMOVM for pt to return call 

## 2018-05-22 NOTE — Telephone Encounter (Signed)
-----   Message from Malva Limes, MD sent at 05/21/2018  8:23 AM EDT ----- Is a little bit anemic, otherwise labs are normal. She needs to take a daily women's multivitamin. Need to follow up and recheck labs in 4 weeks to make sure is improving.

## 2018-05-23 NOTE — Telephone Encounter (Signed)
LMOVM for pt to return call 

## 2018-05-29 NOTE — Telephone Encounter (Signed)
Patient was notified of results. Expressed understanding. Appt scheduled for 06/18/2018 @11 :00 am.

## 2018-06-16 NOTE — Progress Notes (Signed)
Patient: Bethany [name] Female    DOB: [birth year]   72 y.o.   MRN: [id number] Visit Date: 06/18/2018  Today's Provider: [name], MD   Chief Complaint  Patient presents with  . Follow-up   Subjective:    HPI  Weakness From 05/18/2018-reduced Entresto back to 1/2 tablet BID for the next week. Labs checked showing mild pancytopenia CBC Lab Results  Component Value Date   WBC 3.1 (L) 05/18/2018   HGB 10.9 (L) 05/18/2018   HCT 32.5 (L) 05/18/2018   MCV 87 05/18/2018   PLT 137 (L) 05/18/2018     Patient advised to take a daily women's multivitamin and recheck in 4 weeks.   Loss of appetite From 05/18/2018-Put Janumet on hold for a week. Given prescription for cycloheptadine, but advised not to fill unless her appetite does not improve over the weekend. she states her appetite has been doing much better, and she never started the cycloheptadine. She still feels very fatigued, but not short of breath.    Wt Readings from Last 5 Encounters:  06/18/18 124 lb (56.2 kg)  05/18/18 127 lb (57.6 kg)  04/10/18 129 lb 8 oz (58.7 kg)  04/04/18 130 lb 3.2 oz (59.1 kg)  03/02/18 131 lb (59.4 kg)     Allergies  Allergen Reactions  . Other Other (See Comments)    NO BLOOD PRODUCTS- JEHOVAHS WITNESS  . Sulfa Antibiotics Other (See Comments)    Loss of taste     Current Outpatient Medications:  .  acetaminophen (TYLENOL) 500 MG tablet, Take 1,000 mg by mouth 2 (two) times daily as needed for mild pain or moderate pain., Disp: , Rfl:  .  Alcohol Swabs (B-D SINGLE USE SWABS REGULAR) PADS, CHECK BLOOD SUGAR DAILY, Disp: 100 each, Rfl: 4 .  aspirin EC 81 MG tablet, Take 81 mg by mouth daily., Disp: , Rfl:  .  atorvastatin (LIPITOR) 20 MG tablet, Take 1 tablet (20 mg total) by mouth daily., Disp: 90 tablet, Rfl: 3 .  carvedilol (COREG) 6.25 MG tablet, Take 1/2 tablet (3.125 mg) by mouth TWICE daily, Disp: , Rfl:  .  ferrous sulfate 325 (65 FE) MG tablet, Take 325 mg by mouth  2 (two) times daily with a meal., Disp: , Rfl:  .  furosemide (LASIX) 40 MG tablet, Take 20 mg by mouth daily. , Disp: , Rfl:  .  JANUMET XR [phone] MG TB24, TAKE 1 TABLET DAILY. REPLACES JANUVIA AND METFORMIN, Disp: 90 tablet, Rfl: 3 .  pantoprazole (PROTONIX) 40 MG tablet, TAKE 1 TABLET EVERY DAY, Disp: 90 tablet, Rfl: 3 .  sacubitril-valsartan (ENTRESTO) 24-26 MG, Take 0.5 tablets by mouth 2 (two) times daily., Disp: , Rfl:  .  senna (SENOKOT) 8.6 MG TABS tablet, Take 1 tablet by mouth daily as needed for mild constipation., Disp: , Rfl:  .  SYNTHROID 50 MCG tablet, TAKE 1 TABLET EVERY DAY, Disp: 90 tablet, Rfl: 3 .  TRUE METRIX BLOOD GLUCOSE TEST test strip, CHECK BLOOD SUGAR EVERY DAY, Disp: 100 each, Rfl: 3 .  TRUEPLUS LANCETS 33G MISC, CHECK BLOOD SUGAR ONE TIME A DAY, Disp: 100 each, Rfl: 4 .  Vitamin D, Ergocalciferol, (DRISDOL) 50000 units CAPS capsule, TAKE 1 CAPSULE EVERY 7 DAYS, Disp: 12 capsule, Rfl: 3  Review of Systems  Constitutional: Negative for appetite change, chills, fatigue and fever.  Respiratory: Negative for chest tightness and shortness of breath.   Cardiovascular: Negative for chest pain and palpitations.  Gastrointestinal: Negative for abdominal pain, nausea and vomiting.  Neurological: Negative for dizziness and weakness.    Social History   Tobacco Use  . Smoking status: Never Smoker  . Smokeless tobacco: Former Neurosurgeon    Types: Snuff  Substance Use Topics  . Alcohol use: No    Alcohol/week: 0.0 standard drinks   Objective:   BP (!) 92/58 (BP Location: Right Arm, Patient Position: Sitting, Cuff Size: Normal)   Pulse 79   Temp (!) 97.5 F (36.4 C) (Oral)   Resp 16   Ht 5\' 1"  (1.549 m)   Wt 124 lb (56.2 kg)   LMP  (LMP Unknown)   SpO2 99%   BMI 23.43 kg/m  Vitals:   06/18/18 1107  BP: (!) 92/58  Pulse: 79  Resp: 16  Temp: (!) 97.5 F (36.4 C)  TempSrc: Oral  SpO2: 99%  Weight: 124 lb (56.2 kg)  Height: 5\' 1"  (1.549 m)     Physical  Exam   General Appearance:    Alert, cooperative, no distress, thin but not cachectic.   Eyes:    PERRL, conjunctiva/corneas clear, EOM's intact       Lungs:     Clear to auscultation bilaterally, respirations unlabored  Heart:    Regular rate and rhythm  Neurologic:   Awake, alert, oriented x 3. No apparent focal neurological           defect.           Assessment & Plan:     1. Anemia, deficiency Is eating better, and strength is slowly improving. Recheck labs today.  - CBC - B12 and Folate Panel  2. Elevated transaminase level  - Comprehensive metabolic panel  3. Adult hypothyroidism  - TSH  4. Congestive heart failure Only tolerating 1/2 BID Entresto at this time due to hypotension. Continue current medications.        [name], MD  [facility]

## 2018-06-18 ENCOUNTER — Encounter: Payer: Self-pay | Admitting: Family Medicine

## 2018-06-18 ENCOUNTER — Ambulatory Visit: Payer: Medicare PPO | Admitting: Family Medicine

## 2018-06-18 VITALS — BP 92/58 | HR 79 | Temp 97.5°F | Resp 16 | Ht 61.0 in | Wt 124.0 lb

## 2018-06-18 DIAGNOSIS — D539 Nutritional anemia, unspecified: Secondary | ICD-10-CM

## 2018-06-18 DIAGNOSIS — R7401 Elevation of levels of liver transaminase levels: Secondary | ICD-10-CM

## 2018-06-18 DIAGNOSIS — I5022 Chronic systolic (congestive) heart failure: Secondary | ICD-10-CM | POA: Diagnosis not present

## 2018-06-18 DIAGNOSIS — R740 Nonspecific elevation of levels of transaminase and lactic acid dehydrogenase [LDH]: Secondary | ICD-10-CM

## 2018-06-18 DIAGNOSIS — E039 Hypothyroidism, unspecified: Secondary | ICD-10-CM

## 2018-06-19 ENCOUNTER — Telehealth: Payer: Self-pay | Admitting: *Deleted

## 2018-06-19 LAB — COMPREHENSIVE METABOLIC PANEL
A/G RATIO: 1.4 (ref 1.2–2.2)
ALBUMIN: 4.3 g/dL (ref 3.5–4.8)
ALT: 10 IU/L (ref 0–32)
AST: 17 IU/L (ref 0–40)
Alkaline Phosphatase: 84 IU/L (ref 39–117)
BUN / CREAT RATIO: 10 — AB (ref 12–28)
BUN: 8 mg/dL (ref 8–27)
Bilirubin Total: 0.4 mg/dL (ref 0.0–1.2)
CALCIUM: 9.9 mg/dL (ref 8.7–10.3)
CHLORIDE: 99 mmol/L (ref 96–106)
CO2: 25 mmol/L (ref 20–29)
Creatinine, Ser: 0.78 mg/dL (ref 0.57–1.00)
GFR calc Af Amer: 88 mL/min/{1.73_m2} (ref >59)
GFR, EST NON AFRICAN AMERICAN: 76 mL/min/{1.73_m2} (ref >59)
GLOBULIN, TOTAL: 3 g/dL (ref 1.5–4.5)
Glucose: 221 mg/dL — ABNORMAL HIGH (ref 65–99)
Potassium: 3.8 mmol/L (ref 3.5–5.2)
SODIUM: 139 mmol/L (ref 134–144)
Total Protein: 7.3 g/dL (ref 6.0–8.5)

## 2018-06-19 LAB — CBC
HEMATOCRIT: 32.3 % — AB (ref 34.0–46.6)
Hemoglobin: 10.6 g/dL — ABNORMAL LOW (ref 11.1–15.9)
MCH: 29 pg (ref 26.6–33.0)
MCHC: 32.8 g/dL (ref 31.5–35.7)
MCV: 88 fL (ref 79–97)
PLATELETS: 144 10*3/uL — AB (ref 150–450)
RBC: 3.66 x10E6/uL — AB (ref 3.77–5.28)
RDW: 13.9 % (ref 12.3–15.4)
WBC: 3.2 10*3/uL — ABNORMAL LOW (ref 3.4–10.8)

## 2018-06-19 LAB — B12 AND FOLATE PANEL
Folate: >20 ng/mL (ref >3.0)
Vitamin B-12: 401 pg/mL (ref 232–1245)

## 2018-06-19 LAB — TSH: TSH: 0.967 u[IU]/mL (ref 0.450–4.500)

## 2018-06-19 MED ORDER — GLIPIZIDE ER 2.5 MG PO TB24: 2.5000 mg | ORAL_TABLET | Freq: Every day | ORAL | 1 refills | Status: DC

## 2018-06-19 NOTE — Telephone Encounter (Signed)
Patient was notified of results. Expressed understanding. Patient stated she is still taking Janumet.

## 2018-06-19 NOTE — Telephone Encounter (Signed)
-----   Message from Malva Limes, MD sent at 06/19/2018  9:22 AM EDT ----- Blood sugar is a little high, otherwise labs are all stable. Please double check and see if she is still taking Janumet. We may need to adjust her medications for her sugar.

## 2018-06-19 NOTE — Telephone Encounter (Signed)
Add glipizide xl 2.5mg  once a day, #30, rf x 1. Follow up in September as scheduled. To check a1c.

## 2018-06-19 NOTE — Telephone Encounter (Signed)
Patient was advised rx sent to pharmacy. 

## 2018-07-02 ENCOUNTER — Other Ambulatory Visit: Payer: Self-pay | Admitting: Family Medicine

## 2018-07-02 DIAGNOSIS — E119 Type 2 diabetes mellitus without complications: Secondary | ICD-10-CM

## 2018-07-04 ENCOUNTER — Ambulatory Visit (INDEPENDENT_AMBULATORY_CARE_PROVIDER_SITE_OTHER): Payer: Medicare PPO | Admitting: *Deleted

## 2018-07-04 DIAGNOSIS — I428 Other cardiomyopathies: Secondary | ICD-10-CM

## 2018-07-04 NOTE — Progress Notes (Signed)
Remote ICD transmission.   

## 2018-07-24 LAB — CUP PACEART REMOTE DEVICE CHECK
Battery Remaining Longevity: 33 mo
Battery Voltage: 2.95 V
Brady Statistic RA Percent Paced: 0.03 %
Date Time Interrogation Session: 20190828062704
HighPow Impedance: 54 Ohm
Implantable Lead Implant Date: 20160409
Implantable Lead Implant Date: 20160409
Implantable Lead Location: 753859
Implantable Lead Model: 4598
Implantable Lead Model: 5076
Implantable Pulse Generator Implant Date: 20160409
Lead Channel Impedance Value: 1007 Ohm
Lead Channel Impedance Value: 1064 Ohm
Lead Channel Impedance Value: 1083 Ohm
Lead Channel Impedance Value: 361 Ohm
Lead Channel Impedance Value: 418 Ohm
Lead Channel Impedance Value: 475 Ohm
Lead Channel Impedance Value: 475 Ohm
Lead Channel Impedance Value: 532 Ohm
Lead Channel Impedance Value: 874 Ohm
Lead Channel Impedance Value: 893 Ohm
Lead Channel Impedance Value: 931 Ohm
Lead Channel Pacing Threshold Amplitude: 0.375 V
Lead Channel Pacing Threshold Pulse Width: 0.4 ms
Lead Channel Pacing Threshold Pulse Width: 0.4 ms
Lead Channel Sensing Intrinsic Amplitude: 1.125 mV
Lead Channel Sensing Intrinsic Amplitude: 10 mV
Lead Channel Setting Pacing Amplitude: 1.5 V
Lead Channel Setting Pacing Amplitude: 2 V
Lead Channel Setting Pacing Pulse Width: 0.8 ms
Lead Channel Setting Sensing Sensitivity: 0.3 mV
MDC IDC LEAD IMPLANT DT: 20160409
MDC IDC LEAD LOCATION: 753858
MDC IDC LEAD LOCATION: 753860
MDC IDC MSMT LEADCHNL LV IMPEDANCE VALUE: 551 Ohm
MDC IDC MSMT LEADCHNL LV IMPEDANCE VALUE: 760 Ohm
MDC IDC MSMT LEADCHNL LV PACING THRESHOLD AMPLITUDE: 1.75 V
MDC IDC MSMT LEADCHNL LV PACING THRESHOLD PULSEWIDTH: 0.8 ms
MDC IDC MSMT LEADCHNL RA SENSING INTR AMPL: 1.125 mV
MDC IDC MSMT LEADCHNL RV PACING THRESHOLD AMPLITUDE: 0.625 V
MDC IDC MSMT LEADCHNL RV SENSING INTR AMPL: 10 mV
MDC IDC SET LEADCHNL LV PACING AMPLITUDE: 2.25 V
MDC IDC SET LEADCHNL RV PACING PULSEWIDTH: 0.4 ms
MDC IDC STAT BRADY AP VP PERCENT: 0.02 %
MDC IDC STAT BRADY AP VS PERCENT: 0.01 %
MDC IDC STAT BRADY AS VP PERCENT: 98.71 %
MDC IDC STAT BRADY AS VS PERCENT: 1.26 %
MDC IDC STAT BRADY RV PERCENT PACED: 4.97 %

## 2018-08-01 ENCOUNTER — Ambulatory Visit: Payer: Medicare PPO | Admitting: Family Medicine

## 2018-08-01 ENCOUNTER — Encounter: Payer: Self-pay | Admitting: Family Medicine

## 2018-08-01 VITALS — BP 105/61 | HR 73 | Temp 97.6°F | Resp 16 | Ht 61.0 in | Wt 125.0 lb

## 2018-08-01 DIAGNOSIS — Z23 Encounter for immunization: Secondary | ICD-10-CM

## 2018-08-01 DIAGNOSIS — E119 Type 2 diabetes mellitus without complications: Secondary | ICD-10-CM

## 2018-08-01 LAB — POCT GLYCOSYLATED HEMOGLOBIN (HGB A1C)
ESTIMATED AVERAGE GLUCOSE: 143
Hemoglobin A1C: 6.6 % — AB (ref 4.0–5.6)

## 2018-08-01 NOTE — Progress Notes (Signed)
Patient: Bethany [name] Female    DOB: [birth year]   72 y.o.   MRN: [id number] Visit Date: 08/01/2018  Today's Provider: [name], MD   Chief Complaint  Patient presents with  . Follow-up  . Diabetes   Subjective:    HPI   Diabetes Mellitus Type II, Follow-up:   Lab Results  Component Value Date   HGBA1C 6.6 (A) 08/01/2018   HGBA1C 8.2 (H) 04/04/2018   HGBA1C 7.9 10/09/2017   Last seen for diabetes 6 weeks ago.  Management since then includes; added glipizide XL 2.5 mg qd. She reports good compliance with treatment. She is not having side effects. none Current symptoms include none and have been unchanged. Home blood sugar records: fasting range: 80-107  Episodes of hypoglycemia? no   Current Insulin Regimen: n/a Most Recent Eye Exam: 1 year ago Weight trend: stable Prior visit with dietician: no Current diet: well balanced Current exercise: none  ---------------------------------------------------------------   Hypertension, follow-up:  BP Readings from Last 3 Encounters:  08/01/18 105/61  06/18/18 (!) 92/58  05/18/18 (!) 84/58    She was last seen for hypertension 4 months ago.  BP at that visit was 94/54. Management since that visit includes; no changes.She reports good compliance with treatment. She is not having side effects. none She is not exercising. She is not adherent to low salt diet.   Outside blood pressures are 107/71. She is experiencing none.  Patient denies none.   Cardiovascular risk factors include diabetes mellitus.  Use of agents associated with hypertension: none.   ---------------------------------------------------------------   Congestive heart failure From 06/18/2018-no changes. Continue current medications.  she does still feel a little short of breath on exertion, but stable. No change in swelling. Lab Results  Component Value Date   WBC 3.2 (L) 06/18/2018   HGB 10.6 (L) 06/18/2018   HCT 32.3 (L)  06/18/2018   MCV 88 06/18/2018   PLT 144 (L) 06/18/2018   She has follow up with cardiology on 08-14-18   Allergies  Allergen Reactions  . Other Other (See Comments)    NO BLOOD PRODUCTS- JEHOVAHS WITNESS  . Sulfa Antibiotics Other (See Comments)    Loss of taste     Current Outpatient Medications:  .  acetaminophen (TYLENOL) 500 MG tablet, Take 1,000 mg by mouth 2 (two) times daily as needed for mild pain or moderate pain., Disp: , Rfl:  .  Alcohol Swabs (B-D SINGLE USE SWABS REGULAR) PADS, CHECK BLOOD SUGAR DAILY, Disp: 100 each, Rfl: 4 .  aspirin EC 81 MG tablet, Take 81 mg by mouth daily., Disp: , Rfl:  .  atorvastatin (LIPITOR) 20 MG tablet, Take 1 tablet (20 mg total) by mouth daily., Disp: 90 tablet, Rfl: 3 .  carvedilol (COREG) 6.25 MG tablet, Take 1/2 tablet (3.125 mg) by mouth TWICE daily, Disp: , Rfl:  .  ferrous sulfate 325 (65 FE) MG tablet, Take 325 mg by mouth 2 (two) times daily with a meal., Disp: , Rfl:  .  furosemide (LASIX) 40 MG tablet, Take 20 mg by mouth daily. , Disp: , Rfl:  .  glipiZIDE (GLIPIZIDE XL) 2.5 MG 24 hr tablet, Take 1 tablet (2.5 mg total) by mouth daily with breakfast., Disp: 30 tablet, Rfl: 1 .  JANUMET XR [phone] MG TB24, TAKE 1 TABLET DAILY. REPLACES JANUVIA AND METFORMIN, Disp: 90 tablet, Rfl: 3 .  pantoprazole (PROTONIX) 40 MG tablet, TAKE 1 TABLET EVERY DAY, Disp: 90 tablet, Rfl:  3 .  sacubitril-valsartan (ENTRESTO) 24-26 MG, Take 0.5 tablets by mouth 2 (two) times daily., Disp: , Rfl:  .  senna (SENOKOT) 8.6 MG TABS tablet, Take 1 tablet by mouth daily as needed for mild constipation., Disp: , Rfl:  .  SYNTHROID 50 MCG tablet, TAKE 1 TABLET EVERY DAY, Disp: 90 tablet, Rfl: 3 .  TRUE METRIX BLOOD GLUCOSE TEST test strip, CHECK BLOOD SUGAR EVERY DAY, Disp: 100 each, Rfl: 3 .  TRUEPLUS LANCETS 33G MISC, CHECK BLOOD SUGAR ONE TIME A DAY, Disp: 100 each, Rfl: 4 .  Vitamin D, Ergocalciferol, (DRISDOL) 50000 units CAPS capsule, TAKE 1 CAPSULE  EVERY 7 DAYS, Disp: 12 capsule, Rfl: 3  Review of Systems  Constitutional: Negative for appetite change, chills, fatigue and fever.  Respiratory: Negative for chest tightness and shortness of breath.   Cardiovascular: Negative for chest pain and palpitations.  Gastrointestinal: Negative for abdominal pain, nausea and vomiting.  Neurological: Negative for dizziness and weakness.    Social History   Tobacco Use  . Smoking status: Never Smoker  . Smokeless tobacco: Former Neurosurgeon    Types: Snuff  Substance Use Topics  . Alcohol use: No    Alcohol/week: 0.0 standard drinks   Objective:   BP 105/61 (BP Location: Right Arm, Patient Position: Sitting, Cuff Size: Normal)   Pulse 73   Temp 97.6 F (36.4 C) (Oral)   Resp 16   Ht 5\' 1"  (1.549 m)   Wt 125 lb (56.7 kg)   LMP  (LMP Unknown)   SpO2 99%   BMI 23.62 kg/m  Vitals:   08/01/18 1054  BP: 105/61  Pulse: 73  Resp: 16  Temp: 97.6 F (36.4 C)  TempSrc: Oral  SpO2: 99%  Weight: 125 lb (56.7 kg)  Height: 5\' 1"  (1.549 m)     Physical Exam   General Appearance:    Alert, cooperative, no distress  Eyes:    PERRL, conjunctiva/corneas clear, EOM's intact       Lungs:     Clear to auscultation bilaterally, respirations unlabored  Heart:    Regular rate and rhythm  Neurologic:   Awake, alert, oriented x 3. No apparent focal neurological           defect.       Results for orders placed or performed in visit on 08/01/18  POCT glycosylated hemoglobin (Hb A1C)  Result Value Ref Range   Hemoglobin A1C 6.6 (A) 4.0 - 5.6 %   HbA1c POC (<> result, manual entry)     HbA1c, POC (prediabetic range)     HbA1c, POC (controlled diabetic range)     Est. average glucose Bld gHb Est-mCnc 143        Assessment & Plan:      1. Type 2 diabetes mellitus without complication, without long-term current use of insulin (HCC) Well controlled. Tolerating addition of low dose glipizide.  - POCT glycosylated hemoglobin (Hb A1C)  2. Need for  influenza vaccination  - Flu vaccine HIGH DOSE PF  Return in about 5 months (around 01/01/2019).       [name], MD  [facility]

## 2018-08-14 ENCOUNTER — Other Ambulatory Visit: Payer: Self-pay | Admitting: Family Medicine

## 2018-08-14 DIAGNOSIS — I447 Left bundle-branch block, unspecified: Secondary | ICD-10-CM | POA: Diagnosis not present

## 2018-08-14 DIAGNOSIS — E782 Mixed hyperlipidemia: Secondary | ICD-10-CM | POA: Diagnosis not present

## 2018-08-14 DIAGNOSIS — I38 Endocarditis, valve unspecified: Secondary | ICD-10-CM | POA: Diagnosis not present

## 2018-08-14 DIAGNOSIS — Z95810 Presence of automatic (implantable) cardiac defibrillator: Secondary | ICD-10-CM | POA: Diagnosis not present

## 2018-08-14 DIAGNOSIS — I10 Essential (primary) hypertension: Secondary | ICD-10-CM | POA: Diagnosis not present

## 2018-08-14 DIAGNOSIS — I471 Supraventricular tachycardia: Secondary | ICD-10-CM | POA: Diagnosis not present

## 2018-08-14 DIAGNOSIS — I5022 Chronic systolic (congestive) heart failure: Secondary | ICD-10-CM | POA: Diagnosis not present

## 2018-08-14 DIAGNOSIS — I952 Hypotension due to drugs: Secondary | ICD-10-CM | POA: Diagnosis not present

## 2018-08-14 MED ORDER — GLIPIZIDE ER 2.5 MG PO TB24: 2.5000 mg | ORAL_TABLET | Freq: Every day | ORAL | 5 refills | Status: DC

## 2018-08-14 NOTE — Telephone Encounter (Signed)
Pt needing a refill:  glipiZIDE (GLIPIZIDE XL) 2.5 MG 24 hr tablet  Please fill at:  [facility] [address] [place] - [address] [phone] (Phone) [phone] (Fax)   Thanks, [facility]

## 2018-08-23 ENCOUNTER — Telehealth: Payer: Self-pay

## 2018-08-23 ENCOUNTER — Emergency Department: Payer: Medicare PPO

## 2018-08-23 ENCOUNTER — Emergency Department
Admission: EM | Admit: 2018-08-23 | Discharge: 2018-08-23 | Disposition: A | Discharge status: Home / Self Care | Payer: Medicare PPO | Attending: Emergency Medicine | Admitting: Emergency Medicine

## 2018-08-23 ENCOUNTER — Encounter: Payer: Self-pay | Admitting: Emergency Medicine

## 2018-08-23 DIAGNOSIS — S0990XA Unspecified injury of head, initial encounter: Secondary | ICD-10-CM

## 2018-08-23 DIAGNOSIS — E039 Hypothyroidism, unspecified: Secondary | ICD-10-CM | POA: Diagnosis not present

## 2018-08-23 DIAGNOSIS — Z7984 Long term (current) use of oral hypoglycemic drugs: Secondary | ICD-10-CM | POA: Insufficient documentation

## 2018-08-23 DIAGNOSIS — M542 Cervicalgia: Secondary | ICD-10-CM | POA: Diagnosis not present

## 2018-08-23 DIAGNOSIS — I110 Hypertensive heart disease with heart failure: Secondary | ICD-10-CM | POA: Diagnosis not present

## 2018-08-23 DIAGNOSIS — Z95810 Presence of automatic (implantable) cardiac defibrillator: Secondary | ICD-10-CM | POA: Diagnosis not present

## 2018-08-23 DIAGNOSIS — Y92511 Restaurant or cafe as the place of occurrence of the external cause: Secondary | ICD-10-CM | POA: Diagnosis not present

## 2018-08-23 DIAGNOSIS — S300XXA Contusion of lower back and pelvis, initial encounter: Secondary | ICD-10-CM | POA: Insufficient documentation

## 2018-08-23 DIAGNOSIS — W08XXXA Fall from other furniture, initial encounter: Secondary | ICD-10-CM | POA: Diagnosis not present

## 2018-08-23 DIAGNOSIS — E119 Type 2 diabetes mellitus without complications: Secondary | ICD-10-CM | POA: Diagnosis not present

## 2018-08-23 DIAGNOSIS — Z87891 Personal history of nicotine dependence: Secondary | ICD-10-CM | POA: Diagnosis not present

## 2018-08-23 DIAGNOSIS — Z7982 Long term (current) use of aspirin: Secondary | ICD-10-CM | POA: Insufficient documentation

## 2018-08-23 DIAGNOSIS — Z79899 Other long term (current) drug therapy: Secondary | ICD-10-CM | POA: Insufficient documentation

## 2018-08-23 DIAGNOSIS — Y999 Unspecified external cause status: Secondary | ICD-10-CM | POA: Diagnosis not present

## 2018-08-23 DIAGNOSIS — I5022 Chronic systolic (congestive) heart failure: Secondary | ICD-10-CM | POA: Insufficient documentation

## 2018-08-23 DIAGNOSIS — Y9389 Activity, other specified: Secondary | ICD-10-CM | POA: Insufficient documentation

## 2018-08-23 DIAGNOSIS — I501 Left ventricular failure: Secondary | ICD-10-CM | POA: Diagnosis not present

## 2018-08-23 DIAGNOSIS — S3993XA Unspecified injury of pelvis, initial encounter: Secondary | ICD-10-CM | POA: Diagnosis not present

## 2018-08-23 DIAGNOSIS — W19XXXA Unspecified fall, initial encounter: Secondary | ICD-10-CM

## 2018-08-23 NOTE — Telephone Encounter (Signed)
  FYI..  Pt called to report she fell at [employer] at about 11AM.  She states she hit the back of her head and tailbone.  She says her back is "very sore" and her head "doesn't hurt but feels funny"  She is also now complaining of nausea.  I advised her that she should go to the ER to be evaluated.  She agreed and is with friends right now that will bring her.  Thanks,   -Vernona Rieger

## 2018-08-23 NOTE — ED Provider Notes (Signed)
[facility] Emergency Department Provider Note  ___________________________________________   First MD Initiated Contact with Patient 08/23/18 1508     (approximate)  I have reviewed the triage vital signs and the nursing notes.   HISTORY  Chief Complaint Fall   HPI Bethany [name] is a 72 y.o. female with a history of CHF on aspirin was presenting after fall.  Says that she was sitting on edge of a booth at [employer] and did not realize how far to the edge she was when she fell, hitting her coccyx as well as her head when when falling.  Denies any loss of conscious.  Says that she was having nausea previously but is not at this time.  No headache at this time.  However, she says that she has had increased soreness to her tailbone as the day has gone on.  Says that she was able to ambulate and drove herself here as well.  Denies any visual changes at this time.  Past Medical History:  Diagnosis Date  . AICD (automatic cardioverter/defibrillator) present   . Anemia   . Anxiety   . CHF (congestive heart failure) ([place])   . Complication of anesthesia   . Depression   . Diabetes ([place])   . Dilated cardiomyopathy ([place])   . Diverticulosis   . Dyspnea   . Dysrhythmia   . Edema    FEET/ANKLES OCCAS  . GERD (gastroesophageal reflux disease)   . Hyperlipidemia   . Hypothyroidism   . LV dysfunction   . Orthopnea    USES 3 PILLOWS  . PONV (postoperative nausea and vomiting)   . Reflux   . Thrombocytopenia ([place])   . Thyroid disease   . VHD (valvular heart disease)     Patient Active Problem List   Diagnosis Date Noted  . Memory loss 07/12/2016  . GERD (gastroesophageal reflux disease) 08/24/2015  . Automatic implantable cardioverter-defibrillator in situ 07/29/2015  . Hypotension 06/18/2015  . Benign essential HTN 03/20/2015  . Absolute anemia 03/11/2015  . Abnormal liver enzymes 03/11/2015  . Decreased potassium in the blood 03/11/2015  .  Hypercholesteremia 03/11/2015  . Adult hypothyroidism 03/11/2015  . Idiopathic insomnia 03/11/2015  . OP (osteoporosis) 03/11/2015  . Diabetes mellitus, type 2 ([place]) 03/11/2015  . Vitamin D deficiency 03/11/2015  . LBBB (left bundle branch block) 02/11/2015  . Paroxysmal supraventricular tachycardia ([place]) 11/25/2014  . Chronic systolic heart failure ([place]) 09/25/2014  . Heart valve disease 04/10/2014    Past Surgical History:  Procedure Laterality Date  . ABDOMINAL HYSTERECTOMY    . BI-VENTRICULAR IMPLANTABLE CARDIOVERTER DEFIBRILLATOR [name] 02/11/2015   Procedure: BI-VENTRICULAR IMPLANTABLE CARDIOVERTER DEFIBRILLATOR  (CRT-D);  Surgeon: Deboraha Sprang, MD;  Location: [facility] CATH LAB;  Service: Cardiovascular;  Laterality: [name];  . BONE MARROW BIOPSY  2007   spine  . CARDIAC CATHETERIZATION    . CATARACT EXTRACTION W/PHACO Right 01/03/2017   Procedure: CATARACT EXTRACTION PHACO AND INTRAOCULAR LENS PLACEMENT (IOC);  Surgeon: Leandrew Koyanagi, MD;  Location: [facility] ORS;  Service: Ophthalmology;  Laterality: Right;  [place] 00:50 AP% 19.5 CDE 9.94 fluid pack lot # [id number]  . CATARACT EXTRACTION W/PHACO Left 10/27/2016   Procedure: CATARACT EXTRACTION PHACO AND INTRAOCULAR LENS PLACEMENT (IOC);  Surgeon: Leandrew Koyanagi, MD;  Location: [facility] ORS;  Service: Ophthalmology;  Laterality: Left;  [place]  01:12 AP%  21.0 CDE  11.59 Fluid pack lot # [id number]  . CHOLECYSTECTOMY    . INSERT / REPLACE / REMOVE PACEMAKER  AICD  . LEAD REVISION [name] 02/11/2015   Procedure: LEAD REVISION;  Surgeon: Deboraha Sprang, MD;  Location: [facility] CATH LAB;  Service: Cardiovascular;  Laterality: [name];  . PARTIAL HYSTERECTOMY  1977  . TOOTH EXTRACTION  09/23/2015    Prior to Admission medications   Medication Sig Start Date End Date Taking? Authorizing Provider  acetaminophen (TYLENOL) 500 MG tablet Take 1,000 mg by mouth 2 (two) times daily as needed for mild pain or moderate pain.    [provider]  Alcohol  Swabs (B-D SINGLE USE SWABS REGULAR) PADS CHECK BLOOD SUGAR DAILY 10/10/17   Birdie Sons, MD  aspirin EC 81 MG tablet Take 81 mg by mouth daily.    [provider]  atorvastatin (LIPITOR) 20 MG tablet Take 1 tablet (20 mg total) by mouth daily. 10/09/17   Birdie Sons, MD  carvedilol (COREG) 6.25 MG tablet Take 1/2 tablet (3.125 mg) by mouth TWICE daily 03/26/15   [provider]  ferrous sulfate 325 (65 FE) MG tablet Take 325 mg by mouth 2 (two) times daily with a meal.    [provider]  furosemide (LASIX) 40 MG tablet Take 20 mg by mouth daily.     [provider]  glipiZIDE (GLIPIZIDE XL) 2.5 MG 24 hr tablet Take 1 tablet (2.5 mg total) by mouth daily with breakfast. 08/14/18   Birdie Sons, MD  JANUMET XR [phone] MG TB24 TAKE 1 TABLET DAILY. REPLACES JANUVIA AND METFORMIN 02/13/18   Birdie Sons, MD  pantoprazole (PROTONIX) 40 MG tablet TAKE 1 TABLET EVERY DAY 03/30/17   Birdie Sons, MD  sacubitril-valsartan (ENTRESTO) 24-26 MG Take 0.5 tablets by mouth 2 (two) times daily. 04/10/18   Deboraha Sprang, MD  senna (SENOKOT) 8.6 MG TABS tablet Take 1 tablet by mouth daily as needed for mild constipation.    [provider]  SYNTHROID 50 MCG tablet TAKE 1 TABLET EVERY DAY 03/30/17   Birdie Sons, MD  TRUE METRIX BLOOD GLUCOSE TEST test strip CHECK BLOOD SUGAR EVERY DAY 04/05/18   Birdie Sons, MD  TRUEPLUS LANCETS 33G MISC CHECK BLOOD SUGAR ONE TIME A DAY 07/02/18   Birdie Sons, MD  Vitamin D, Ergocalciferol, (DRISDOL) 50000 units CAPS capsule TAKE 1 CAPSULE EVERY 7 DAYS 03/28/18   Birdie Sons, MD    Allergies Other and Sulfa antibiotics  Family History  Problem Relation Age of Onset  . Heart disease Mother   . Heart attack Mother   . Prostate cancer Father   . Ulcers Father   . Stroke Sister   . Heart attack Sister   . Diabetes Sister   . Heart attack Brother   . Stroke Brother   . Breast cancer Neg Hx      Social History Social History   Tobacco Use  . Smoking status: Never Smoker  . Smokeless tobacco: Former Systems developer    Types: Snuff  Substance Use Topics  . Alcohol use: No    Alcohol/week: 0.0 standard drinks  . Drug use: No    Review of Systems  Constitutional: No fever/chills Eyes: No visual changes. ENT: No sore throat. Cardiovascular: Denies chest pain. Respiratory: Denies shortness of breath. Gastrointestinal: No abdominal pain.  no vomiting.  No diarrhea.  No constipation. Genitourinary: Negative for dysuria. Musculoskeletal: Negative for back pain. Skin: Negative for rash. Neurological: Negative for  focal weakness or numbness.   ____________________________________________   PHYSICAL EXAM:  VITAL SIGNS: ED  Triage Vitals  Enc Vitals Group     BP 08/23/18 1301 116/69     Pulse Rate 08/23/18 1301 72     Resp 08/23/18 1301 18     Temp 08/23/18 1301 97.7 F (36.5 C)     Temp Source 08/23/18 1301 Oral     SpO2 08/23/18 1301 100 %     Weight 08/23/18 1302 125 lb (56.7 kg)     Height 08/23/18 1302 _0  (1.549 m)     Head Circumference --      Peak Flow --      Pain Score 08/23/18 1302 7     Pain Loc --      Pain Edu? --      Excl. in [place]? --     Constitutional: Alert and oriented. Well appearing and in no acute distress. Eyes: Conjunctivae are normal.  Head: Atraumatic. Nose: No congestion/rhinnorhea. Mouth/Throat: Mucous membranes are moist.  Neck: No stridor.  Tenderness to midline cervical spine.  No deformity or step-off.  Mild tenderness laterally to the trapezius muscles.  Patient ranges head neck freely. Cardiovascular: Normal rate, regular rhythm. Grossly normal heart sounds.   Respiratory: Normal respiratory effort.  No retractions. Lungs CTAB. Gastrointestinal: Soft and nontender. No distention. No CVA tenderness. Musculoskeletal: No lower extremity tenderness nor edema.  No joint effusions.  5 out of 5 strength to the bilateral lower  extremity's.  Stable pelvis.  No tenderness to bilateral hips.  No limb shortening.  Mild tenderness to palpation over the mid sacrum as well as coccyx. Neurologic:  Normal speech and language. No gross focal neurologic deficits are appreciated. Skin:  Skin is warm, dry and intact. No rash noted. Psychiatric: Mood and affect are normal. Speech and behavior are normal.  ____________________________________________   LABS (all labs ordered are listed, but only abnormal results are displayed)  Labs Reviewed - No data to display ____________________________________________  EKG   ____________________________________________  RADIOLOGY  Patient with negative head CT as well as sacrum and coccyx x-ray. ____________________________________________   PROCEDURES  Procedure(s) performed:   Procedures  Critical Care performed:   ____________________________________________   INITIAL IMPRESSION / ASSESSMENT AND PLAN / ED COURSE  Pertinent labs & imaging results that were available during my care of the patient were reviewed by me and considered in my medical decision making (see chart for details).  DDX: Concussion, skull fracture, intercranial hemorrhage, whiplash injury, contusion, sacral fracture, coccyx fracture As part of my medical decision making, I reviewed the following data within the electronic MEDICAL RECORD NUMBER Notes from prior ED visits  Patient updated as to her imaging results.  Understands that she will likely be sore over the next several days.  Recommended that she use ice as well as Tylenol for the pain control.  She is understanding the diagnosis as well as treatment plan willing to comply. ____________________________________________   FINAL CLINICAL IMPRESSION(S) / ED DIAGNOSES  Fall.  Head injury.  Contusion.  NEW MEDICATIONS STARTED DURING THIS VISIT:  New Prescriptions   No medications on file     Note:  This document was prepared using Dragon  voice recognition software and may include unintentional dictation errors.     Orbie Pyo, MD 08/23/18 [phone]

## 2018-08-23 NOTE — ED Triage Notes (Signed)
PT arrived via pov after mechanical fall that lead pt to slip off the chair on to her tailbone. Pt also hit her head but denies any loc. Pt state she feels "nauseated and strange." Pt denies chest pain or shortness of breath. No neuro deficits in triage.

## 2018-08-29 ENCOUNTER — Encounter: Payer: Self-pay | Admitting: Family Medicine

## 2018-08-29 ENCOUNTER — Ambulatory Visit: Payer: Medicare PPO | Admitting: Family Medicine

## 2018-08-29 VITALS — BP 102/64 | HR 70 | Temp 97.8°F | Wt 128.2 lb

## 2018-08-29 DIAGNOSIS — S0003XA Contusion of scalp, initial encounter: Secondary | ICD-10-CM | POA: Diagnosis not present

## 2018-08-29 NOTE — Patient Instructions (Signed)
Take a full tablet of furosemide today, and on days your weight goes up more than 1 pound, then go back to 1/2 tablet daily

## 2018-08-29 NOTE — Progress Notes (Signed)
Patient: Bethany [name] Female    DOB: 06-Dec-1945   72 y.o.   MRN: [id number] Visit Date: 08/29/2018  Today's Provider: [name], MD   Chief Complaint  Patient presents with  . Follow-up   Subjective:    HPI  Follow up ER visit  Patient was seen in ER for fall on 08/23/2018. She was treated for head injury and contusion of pelvic region. She lost footing getting out of booth at [employer].  Treatment for this included recommend patient to use ice and take tylenol. She reports good compliance with treatment. She reports this condition is Improved.  Wt Readings from Last 3 Encounters:  08/29/18 128 lb 3.2 oz (58.2 kg)  08/23/18 125 lb (56.7 kg)  08/01/18 125 lb (56.7 kg)    ------------------------------------------------------------------------------------      Allergies  Allergen Reactions  . Other Other (See Comments)    NO BLOOD PRODUCTS- JEHOVAHS WITNESS  . Sulfa Antibiotics Other (See Comments)    Loss of taste     Current Outpatient Medications:  .  acetaminophen (TYLENOL) 500 MG tablet, Take 1,000 mg by mouth 2 (two) times daily as needed for mild pain or moderate pain., Disp: , Rfl:  .  Alcohol Swabs (B-D SINGLE USE SWABS REGULAR) PADS, CHECK BLOOD SUGAR DAILY, Disp: 100 each, Rfl: 4 .  aspirin EC 81 MG tablet, Take 81 mg by mouth daily., Disp: , Rfl:  .  atorvastatin (LIPITOR) 20 MG tablet, Take 1 tablet (20 mg total) by mouth daily., Disp: 90 tablet, Rfl: 3 .  carvedilol (COREG) 6.25 MG tablet, Take 1/2 tablet (3.125 mg) by mouth TWICE daily, Disp: , Rfl:  .  ferrous sulfate 325 (65 FE) MG tablet, Take 325 mg by mouth 2 (two) times daily with a meal., Disp: , Rfl:  .  furosemide (LASIX) 40 MG tablet, Take 20 mg by mouth daily. , Disp: , Rfl:  .  glipiZIDE (GLIPIZIDE XL) 2.5 MG 24 hr tablet, Take 1 tablet (2.5 mg total) by mouth daily with breakfast., Disp: 30 tablet, Rfl: 5 .  JANUMET XR [phone] MG TB24, TAKE 1 TABLET DAILY. REPLACES JANUVIA  AND METFORMIN, Disp: 90 tablet, Rfl: 3 .  pantoprazole (PROTONIX) 40 MG tablet, TAKE 1 TABLET EVERY DAY, Disp: 90 tablet, Rfl: 3 .  sacubitril-valsartan (ENTRESTO) 24-26 MG, Take 0.5 tablets by mouth 2 (two) times daily., Disp: , Rfl:  .  senna (SENOKOT) 8.6 MG TABS tablet, Take 1 tablet by mouth daily as needed for mild constipation., Disp: , Rfl:  .  SYNTHROID 50 MCG tablet, TAKE 1 TABLET EVERY DAY, Disp: 90 tablet, Rfl: 3 .  TRUE METRIX BLOOD GLUCOSE TEST test strip, CHECK BLOOD SUGAR EVERY DAY, Disp: 100 each, Rfl: 3 .  TRUEPLUS LANCETS 33G MISC, CHECK BLOOD SUGAR ONE TIME A DAY, Disp: 100 each, Rfl: 4 .  Vitamin D, Ergocalciferol, (DRISDOL) 50000 units CAPS capsule, TAKE 1 CAPSULE EVERY 7 DAYS, Disp: 12 capsule, Rfl: 3  Review of Systems  Constitutional: Negative.   HENT: Negative.   Respiratory: Negative.   Cardiovascular: Negative.   Gastrointestinal: Negative.   Genitourinary: Negative.   Musculoskeletal: Positive for back pain.  Neurological: Negative.     Social History   Tobacco Use  . Smoking status: Never Smoker  . Smokeless tobacco: Former Neurosurgeon    Types: Snuff  Substance Use Topics  . Alcohol use: No    Alcohol/week: 0.0 standard drinks   Objective:   BP 102/64 (  BP Location: Right Arm, Patient Position: Sitting, Cuff Size: Normal)   Pulse 70   Temp 97.8 F (36.6 C) (Oral)   Wt 128 lb 3.2 oz (58.2 kg)   LMP  (LMP Unknown)   SpO2 98%   BMI 24.22 kg/m  Vitals:   08/29/18 1009  BP: 102/64  Pulse: 70  Temp: 97.8 F (36.6 C)  TempSrc: Oral  SpO2: 98%  Weight: 128 lb 3.2 oz (58.2 kg)     Physical Exam  General appearance: alert, well developed, well nourished, cooperative and in no distress Head: Normocephalic, without obvious abnormality.small contusion around vertex of scalp.  Respiratory: Respirations even and unlabored, normal respiratory rate Extremities: No gross deformities Skin: Skin color, texture, turgor normal. No rashes seen  Psych:  Appropriate mood and affect. Neurologic: Mental status: Alert, oriented to person, place, and time, thought content appropriate.     Assessment & Plan:     1. Contusion of scalp, initial encounter Healing appropriately. Call if she develops any unusual headaches or other neurological sx.        [name], MD  [facility]

## 2018-09-01 ENCOUNTER — Emergency Department
Admission: EM | Admit: 2018-09-01 | Discharge: 2018-09-01 | Disposition: A | Discharge status: Home / Self Care | Payer: Medicare PPO | Attending: Emergency Medicine | Admitting: Emergency Medicine

## 2018-09-01 ENCOUNTER — Emergency Department: Payer: Medicare PPO

## 2018-09-01 ENCOUNTER — Encounter: Payer: Self-pay | Admitting: Emergency Medicine

## 2018-09-01 DIAGNOSIS — E039 Hypothyroidism, unspecified: Secondary | ICD-10-CM | POA: Insufficient documentation

## 2018-09-01 DIAGNOSIS — I110 Hypertensive heart disease with heart failure: Secondary | ICD-10-CM | POA: Diagnosis not present

## 2018-09-01 DIAGNOSIS — Y999 Unspecified external cause status: Secondary | ICD-10-CM | POA: Diagnosis not present

## 2018-09-01 DIAGNOSIS — Z7982 Long term (current) use of aspirin: Secondary | ICD-10-CM | POA: Diagnosis not present

## 2018-09-01 DIAGNOSIS — I5022 Chronic systolic (congestive) heart failure: Secondary | ICD-10-CM | POA: Diagnosis not present

## 2018-09-01 DIAGNOSIS — Y929 Unspecified place or not applicable: Secondary | ICD-10-CM | POA: Insufficient documentation

## 2018-09-01 DIAGNOSIS — Z7984 Long term (current) use of oral hypoglycemic drugs: Secondary | ICD-10-CM | POA: Diagnosis not present

## 2018-09-01 DIAGNOSIS — Z79899 Other long term (current) drug therapy: Secondary | ICD-10-CM | POA: Diagnosis not present

## 2018-09-01 DIAGNOSIS — E119 Type 2 diabetes mellitus without complications: Secondary | ICD-10-CM | POA: Insufficient documentation

## 2018-09-01 DIAGNOSIS — S92321A Displaced fracture of second metatarsal bone, right foot, initial encounter for closed fracture: Secondary | ICD-10-CM

## 2018-09-01 DIAGNOSIS — W010XXA Fall on same level from slipping, tripping and stumbling without subsequent striking against object, initial encounter: Secondary | ICD-10-CM | POA: Diagnosis not present

## 2018-09-01 DIAGNOSIS — S99921A Unspecified injury of right foot, initial encounter: Secondary | ICD-10-CM | POA: Diagnosis present

## 2018-09-01 DIAGNOSIS — Z95810 Presence of automatic (implantable) cardiac defibrillator: Secondary | ICD-10-CM | POA: Diagnosis not present

## 2018-09-01 DIAGNOSIS — Y9389 Activity, other specified: Secondary | ICD-10-CM | POA: Insufficient documentation

## 2018-09-01 MED ORDER — TRAMADOL HCL 50 MG PO TABS
50.0000 mg | ORAL_TABLET | Freq: Once | ORAL | Status: AC
  Administered 2018-09-01: 50 mg via ORAL
  Filled 2018-09-01: qty 1

## 2018-09-01 MED ORDER — ONDANSETRON 4 MG PO TBDP
4.0000 mg | ORAL_TABLET | Freq: Once | ORAL | Status: AC
  Administered 2018-09-01: 4 mg via ORAL
  Filled 2018-09-01: qty 1

## 2018-09-01 MED ORDER — TRAMADOL HCL 50 MG PO TABS: 50.0000 mg | ORAL_TABLET | Freq: Three times a day (TID) | ORAL | 0 refills | Status: DC | PRN

## 2018-09-01 NOTE — Discharge Instructions (Addendum)
Follow-up with Dr. Alberteen Spindle who is on-call for podiatry.  You will need to call make an appointment in his office.  His contact information is listed on your discharge papers.  Ice and elevate your foot to reduce swelling.  Do not bear weight.  You will need to use your walker anytime you are up walking.  Elevate your foot with pillows tonight when you sleep.  Tramadol is for pain every 8 hours.  You may also take Tylenol between times if something is needed for pain.  Leave splint on until seen by the podiatrist.

## 2018-09-01 NOTE — ED Triage Notes (Signed)
Pt arrived from home after mechanical fall that caused pt to cause pain to right foot.

## 2018-09-01 NOTE — ED Notes (Signed)
States she became slightly nauseated when she sat up and tried to ambulate  Provider aware

## 2018-09-01 NOTE — ED Provider Notes (Signed)
[facility] Emergency Department Provider Note  ____________________________________________   First MD Initiated Contact with Patient 09/01/18 1204     (approximate)  I have reviewed the triage vital signs and the nursing notes.   HISTORY  Chief Complaint Foot Pain   HPI Bethany [name] is a 72 y.o. female presents to the ED with complaint of right foot pain.  Patient states that she was sitting in front of her computer when she realized that she was running late.  Her jacket was on the floor and apparently she tripped causing her injury.  She denies any head injury or loss of consciousness.  She is noticed that her foot is more swollen and painful to bear weight.  She denies any previous injury.  Currently she rates her pain as 7 out of 10.   Past Medical History:  Diagnosis Date  . AICD (automatic cardioverter/defibrillator) present   . Anemia   . Anxiety   . CHF (congestive heart failure) ([place])   . Complication of anesthesia   . Depression   . Diabetes ([place])   . Dilated cardiomyopathy ([place])   . Diverticulosis   . Dyspnea   . Dysrhythmia   . Edema    FEET/ANKLES OCCAS  . GERD (gastroesophageal reflux disease)   . Hyperlipidemia   . Hypothyroidism   . LV dysfunction   . Orthopnea    USES 3 PILLOWS  . PONV (postoperative nausea and vomiting)   . Reflux   . Thrombocytopenia ([place])   . Thyroid disease   . VHD (valvular heart disease)     Patient Active Problem List   Diagnosis Date Noted  . Memory loss 07/12/2016  . GERD (gastroesophageal reflux disease) 08/24/2015  . Automatic implantable cardioverter-defibrillator in situ 07/29/2015  . Hypotension 06/18/2015  . Benign essential HTN 03/20/2015  . Absolute anemia 03/11/2015  . Abnormal liver enzymes 03/11/2015  . Decreased potassium in the blood 03/11/2015  . Hypercholesteremia 03/11/2015  . Adult hypothyroidism 03/11/2015  . Idiopathic insomnia 03/11/2015  . OP (osteoporosis)  03/11/2015  . Diabetes mellitus, type 2 ([place]) 03/11/2015  . Vitamin D deficiency 03/11/2015  . LBBB (left bundle branch block) 02/11/2015  . Paroxysmal supraventricular tachycardia ([place]) 11/25/2014  . Chronic systolic heart failure ([place]) 09/25/2014  . Heart valve disease 04/10/2014    Past Surgical History:  Procedure Laterality Date  . ABDOMINAL HYSTERECTOMY    . BI-VENTRICULAR IMPLANTABLE CARDIOVERTER DEFIBRILLATOR [name] 02/11/2015   Procedure: BI-VENTRICULAR IMPLANTABLE CARDIOVERTER DEFIBRILLATOR  (CRT-D);  Surgeon: Deboraha Sprang, MD;  Location: [facility] CATH LAB;  Service: Cardiovascular;  Laterality: [name];  . BONE MARROW BIOPSY  2007   spine  . CARDIAC CATHETERIZATION    . CATARACT EXTRACTION W/PHACO Right 01/03/2017   Procedure: CATARACT EXTRACTION PHACO AND INTRAOCULAR LENS PLACEMENT (IOC);  Surgeon: Leandrew Koyanagi, MD;  Location: [facility] ORS;  Service: Ophthalmology;  Laterality: Right;  [place] 00:50 AP% 19.5 CDE 9.94 fluid pack lot # [id number]  . CATARACT EXTRACTION W/PHACO Left 10/27/2016   Procedure: CATARACT EXTRACTION PHACO AND INTRAOCULAR LENS PLACEMENT (IOC);  Surgeon: Leandrew Koyanagi, MD;  Location: [facility] ORS;  Service: Ophthalmology;  Laterality: Left;  [place]  01:12 AP%  21.0 CDE  11.59 Fluid pack lot # [id number]  . CHOLECYSTECTOMY    . INSERT / REPLACE / REMOVE PACEMAKER     AICD  . LEAD REVISION [name] 02/11/2015   Procedure: LEAD REVISION;  Surgeon: Deboraha Sprang, MD;  Location: [facility] CATH LAB;  Service: Cardiovascular;  Laterality: [name];  . PARTIAL HYSTERECTOMY  1977  . TOOTH EXTRACTION  09/23/2015    Prior to Admission medications   Medication Sig Start Date End Date Taking? Authorizing Provider  acetaminophen (TYLENOL) 500 MG tablet Take 1,000 mg by mouth 2 (two) times daily as needed for mild pain or moderate pain.    [provider]  Alcohol Swabs (B-D SINGLE USE SWABS REGULAR) PADS CHECK BLOOD SUGAR DAILY 10/10/17   Birdie Sons, MD  aspirin EC 81 MG tablet  Take 81 mg by mouth daily.    [provider]  atorvastatin (LIPITOR) 20 MG tablet Take 1 tablet (20 mg total) by mouth daily. 10/09/17   Birdie Sons, MD  carvedilol (COREG) 6.25 MG tablet Take 1/2 tablet (3.125 mg) by mouth TWICE daily 03/26/15   [provider]  ferrous sulfate 325 (65 FE) MG tablet Take 325 mg by mouth 2 (two) times daily with a meal.    [provider]  furosemide (LASIX) 40 MG tablet Take 20 mg by mouth daily.     [provider]  glipiZIDE (GLIPIZIDE XL) 2.5 MG 24 hr tablet Take 1 tablet (2.5 mg total) by mouth daily with breakfast. 08/14/18   Birdie Sons, MD  JANUMET XR [phone] MG TB24 TAKE 1 TABLET DAILY. REPLACES JANUVIA AND METFORMIN 02/13/18   Birdie Sons, MD  pantoprazole (PROTONIX) 40 MG tablet TAKE 1 TABLET EVERY DAY 03/30/17   Birdie Sons, MD  sacubitril-valsartan (ENTRESTO) 24-26 MG Take 0.5 tablets by mouth 2 (two) times daily. 04/10/18   Deboraha Sprang, MD  senna (SENOKOT) 8.6 MG TABS tablet Take 1 tablet by mouth daily as needed for mild constipation.    [provider]  SYNTHROID 50 MCG tablet TAKE 1 TABLET EVERY DAY 03/30/17   Birdie Sons, MD  traMADol (ULTRAM) 50 MG tablet Take 1 tablet (50 mg total) by mouth every 8 (eight) hours as needed. 09/01/18   Johnn Hai, PA-C  TRUE METRIX BLOOD GLUCOSE TEST test strip CHECK BLOOD SUGAR EVERY DAY 04/05/18   Birdie Sons, MD  TRUEPLUS LANCETS 33G MISC CHECK BLOOD SUGAR ONE TIME A DAY 07/02/18   Birdie Sons, MD  Vitamin D, Ergocalciferol, (DRISDOL) 50000 units CAPS capsule TAKE 1 CAPSULE EVERY 7 DAYS 03/28/18   Birdie Sons, MD    Allergies Other and Sulfa antibiotics  Family History  Problem Relation Age of Onset  . Heart disease Mother   . Heart attack Mother   . Prostate cancer Father   . Ulcers Father   . Stroke Sister   . Heart attack Sister   . Diabetes Sister   . Heart attack Brother   . Stroke Brother   . Breast cancer  Neg Hx     Social History Social History   Tobacco Use  . Smoking status: Never Smoker  . Smokeless tobacco: Former Systems developer    Types: Snuff  Substance Use Topics  . Alcohol use: No    Alcohol/week: 0.0 standard drinks  . Drug use: No    Review of Systems Constitutional: No fever/chills Eyes: No visual changes. ENT: No trauma. Cardiovascular: Denies chest pain. Respiratory: Denies shortness of breath. Gastrointestinal: No abdominal pain.  No nausea, no vomiting.  Musculoskeletal: Negative for back pain.  Positive for right foot pain. Skin: Negative for rash. Neurological: Negative for headaches, focal weakness or numbness. ___________________________________________   PHYSICAL EXAM:  VITAL SIGNS: ED Triage Vitals  Enc Vitals  Group     BP 09/01/18 1157 100/62     Pulse Rate 09/01/18 1157 80     Resp 09/01/18 1157 16     Temp 09/01/18 1157 98.4 F (36.9 C)     Temp src --      SpO2 09/01/18 1157 100 %     Weight 09/01/18 1156 124 lb (56.2 kg)     Height 09/01/18 1156 _0  (1.549 m)     Head Circumference --      Peak Flow --      Pain Score 09/01/18 1156 7     Pain Loc --      Pain Edu? --      Excl. in [place]? --    Constitutional: Alert and oriented. Well appearing and in no acute distress. Eyes: Conjunctivae are normal.  Head: Atraumatic. Nose: No trauma. Neck: No stridor.   Cardiovascular: Normal rate, regular rhythm. Grossly normal heart sounds.  Good peripheral circulation. Respiratory: Normal respiratory effort.  No retractions. Lungs CTAB. Gastrointestinal: Soft and nontender. No distention. Musculoskeletal: Examination of the right foot there is moderate soft tissue edema noted in the area of the first second and third metatarsals.  There is marked tenderness on palpation.  Motor or sensory function intact distal to her injury.  Digits move well with discomfort in the injured area.  Pulse present.  Skin is intact.  Gait was not tested secondary to patient's  injury.  Nontender ankle or knee to palpation. Neurologic:  Normal speech and language. No gross focal neurologic deficits are appreciated.  Skin:  Skin is warm, dry and intact. No rash noted. Psychiatric: Mood and affect are normal. Speech and behavior are normal.  ____________________________________________   LABS (all labs ordered are listed, but only abnormal results are displayed)  Labs Reviewed - No data to display  RADIOLOGY  ED MD interpretation:  Right foot x-ray shows second metatarsal fracture.  Official radiology report(s): Dg Foot Complete Right  Result Date: 09/01/2018 CLINICAL DATA:  Right foot pain EXAM: RIGHT FOOT COMPLETE - 3+ VIEW COMPARISON:  None. FINDINGS: Fracture involving the medial base of the 2nd metatarsal. No definite widening at the Lisfranc joint. Associated mild soft tissue swelling. Small plantar and posterior calcaneal enthesophytes. IMPRESSION: Fracture involving the medial base of the 2nd metatarsal. No definite widening at the Lisfranc joint. Electronically Signed   By: Julian Hy M.D.   On: 09/01/2018 13:01    ____________________________________________   PROCEDURES  Procedure(s) performed:   .Splint Application Date/Time: 19/41/7408 2:09 PM Performed by: Archie Endo, NT Authorized by: Johnn Hai, PA-C   Consent:    Consent obtained:  Verbal   Consent given by:  Patient   Risks discussed:  Pain Pre-procedure details:    Sensation:  Normal Procedure details:    Laterality:  Right   Location:  Foot   Foot:  R foot   Strapping: no     Splint type:  Short leg   Supplies:  Ortho-Glass Post-procedure details:    Pain:  Improved   Sensation:  Normal   Patient tolerance of procedure:  Tolerated well, no immediate complications    Critical Care performed: No  ____________________________________________   INITIAL IMPRESSION / ASSESSMENT AND PLAN / ED COURSE  As part of my medical decision making, I  reviewed the following data within the electronic MEDICAL RECORD NUMBER Notes from prior ED visits and [place] Controlled Substance Database  Patient presents to the ED with complaint of right  foot pain after she tripped over her coat that was lying in the floor.  She denies any head injury or loss of consciousness.  She is continued to have pain in her foot and swelling.  X-rays confirm a fracture to the second metatarsal.  Patient was placed in a posterior OCL splint and patient was given a walker.  She is also given information to follow-up with the podiatrist and contact information was on her discharge papers.  Patient is aware that she will need to ice and elevate her foot frequently to reduce swelling and also help with pain.  A prescription for tramadol was sent to her pharmacy.  She is aware that this medication could cause drowsiness and increase her risk for injury. ____________________________________________   FINAL CLINICAL IMPRESSION(S) / ED DIAGNOSES  Final diagnoses:  Closed displaced fracture of second metatarsal bone of right foot, initial encounter     ED Discharge Orders         Ordered    traMADol (ULTRAM) 50 MG tablet  Every 8 hours PRN     09/01/18 1337           Note:  This document was prepared using Dragon voice recognition software and may include unintentional dictation errors.    Johnn Hai, PA-C 09/01/18 1417    Lisa Roca, MD 09/01/18 [phone]

## 2018-09-03 DIAGNOSIS — M79675 Pain in left toe(s): Secondary | ICD-10-CM | POA: Diagnosis not present

## 2018-09-03 DIAGNOSIS — E119 Type 2 diabetes mellitus without complications: Secondary | ICD-10-CM | POA: Diagnosis not present

## 2018-09-03 DIAGNOSIS — M79674 Pain in right toe(s): Secondary | ICD-10-CM | POA: Diagnosis not present

## 2018-09-03 DIAGNOSIS — L600 Ingrowing nail: Secondary | ICD-10-CM | POA: Diagnosis not present

## 2018-09-03 DIAGNOSIS — B351 Tinea unguium: Secondary | ICD-10-CM | POA: Diagnosis not present

## 2018-09-03 DIAGNOSIS — S92321A Displaced fracture of second metatarsal bone, right foot, initial encounter for closed fracture: Secondary | ICD-10-CM | POA: Diagnosis not present

## 2018-09-10 ENCOUNTER — Other Ambulatory Visit: Payer: Self-pay | Admitting: Family Medicine

## 2018-09-10 DIAGNOSIS — E039 Hypothyroidism, unspecified: Secondary | ICD-10-CM

## 2018-09-10 MED ORDER — LEVOTHYROXINE SODIUM 50 MCG PO TABS: 50.0000 ug | ORAL_TABLET | Freq: Every day | ORAL | 1 refills | Status: DC

## 2018-09-10 NOTE — Telephone Encounter (Signed)
[facility] - [place] Folks [phone]   90 day supply of: SYNTHROID 50 MCG tablet  Pt is currently out of medication. Asking if an emergency supply can be called into local pharmacy:  [facility] [address] ([address]), [place] - [address] [phone] (Phone) [phone] (Fax)    Please advise.  Thanks, [employer]

## 2018-09-24 DIAGNOSIS — M79671 Pain in right foot: Secondary | ICD-10-CM | POA: Diagnosis not present

## 2018-09-24 DIAGNOSIS — S92321D Displaced fracture of second metatarsal bone, right foot, subsequent encounter for fracture with routine healing: Secondary | ICD-10-CM | POA: Diagnosis not present

## 2018-10-01 ENCOUNTER — Other Ambulatory Visit: Payer: Self-pay | Admitting: Family Medicine

## 2018-10-01 DIAGNOSIS — E119 Type 2 diabetes mellitus without complications: Secondary | ICD-10-CM

## 2018-10-03 ENCOUNTER — Ambulatory Visit (INDEPENDENT_AMBULATORY_CARE_PROVIDER_SITE_OTHER): Payer: Medicare PPO

## 2018-10-03 ENCOUNTER — Other Ambulatory Visit: Payer: Self-pay | Admitting: Family Medicine

## 2018-10-03 DIAGNOSIS — E119 Type 2 diabetes mellitus without complications: Secondary | ICD-10-CM

## 2018-10-03 DIAGNOSIS — I447 Left bundle-branch block, unspecified: Secondary | ICD-10-CM

## 2018-10-03 DIAGNOSIS — I428 Other cardiomyopathies: Secondary | ICD-10-CM

## 2018-10-03 NOTE — Progress Notes (Signed)
Remote ICD transmission.   

## 2018-10-15 DIAGNOSIS — S92321D Displaced fracture of second metatarsal bone, right foot, subsequent encounter for fracture with routine healing: Secondary | ICD-10-CM | POA: Diagnosis not present

## 2018-11-15 DIAGNOSIS — B351 Tinea unguium: Secondary | ICD-10-CM | POA: Diagnosis not present

## 2018-11-15 DIAGNOSIS — S92321D Displaced fracture of second metatarsal bone, right foot, subsequent encounter for fracture with routine healing: Secondary | ICD-10-CM | POA: Diagnosis not present

## 2018-11-15 DIAGNOSIS — M79671 Pain in right foot: Secondary | ICD-10-CM | POA: Diagnosis not present

## 2018-11-15 DIAGNOSIS — L600 Ingrowing nail: Secondary | ICD-10-CM | POA: Diagnosis not present

## 2018-11-23 LAB — CUP PACEART REMOTE DEVICE CHECK
Battery Voltage: 2.94 V
Brady Statistic AP VP Percent: 0.02 %
Brady Statistic AS VS Percent: 1.26 %
Brady Statistic RA Percent Paced: 0.04 %
Date Time Interrogation Session: 20191127051607
HIGH POWER IMPEDANCE MEASURED VALUE: 60 Ohm
Implantable Lead Implant Date: 20160409
Implantable Lead Implant Date: 20160409
Implantable Lead Location: 753859
Implantable Lead Location: 753860
Implantable Lead Model: 5076
Implantable Pulse Generator Implant Date: 20160409
Lead Channel Impedance Value: 1064 Ohm
Lead Channel Impedance Value: 1121 Ohm
Lead Channel Impedance Value: 399 Ohm
Lead Channel Impedance Value: 399 Ohm
Lead Channel Impedance Value: 475 Ohm
Lead Channel Impedance Value: 703 Ohm
Lead Channel Impedance Value: 950 Ohm
Lead Channel Impedance Value: 950 Ohm
Lead Channel Impedance Value: 988 Ohm
Lead Channel Pacing Threshold Amplitude: 0.375 V
Lead Channel Pacing Threshold Amplitude: 0.625 V
Lead Channel Pacing Threshold Amplitude: 1.75 V
Lead Channel Pacing Threshold Pulse Width: 0.4 ms
Lead Channel Pacing Threshold Pulse Width: 0.8 ms
Lead Channel Sensing Intrinsic Amplitude: 1 mV
Lead Channel Setting Pacing Amplitude: 2 V
Lead Channel Setting Pacing Amplitude: 2.25 V
Lead Channel Setting Pacing Pulse Width: 0.4 ms
Lead Channel Setting Sensing Sensitivity: 0.3 mV
MDC IDC LEAD IMPLANT DT: 20160409
MDC IDC LEAD LOCATION: 753858
MDC IDC MSMT BATTERY REMAINING LONGEVITY: 28 mo
MDC IDC MSMT LEADCHNL LV IMPEDANCE VALUE: 456 Ohm
MDC IDC MSMT LEADCHNL LV IMPEDANCE VALUE: 513 Ohm
MDC IDC MSMT LEADCHNL LV IMPEDANCE VALUE: 589 Ohm
MDC IDC MSMT LEADCHNL LV IMPEDANCE VALUE: 874 Ohm
MDC IDC MSMT LEADCHNL RA SENSING INTR AMPL: 1 mV
MDC IDC MSMT LEADCHNL RV PACING THRESHOLD PULSEWIDTH: 0.4 ms
MDC IDC MSMT LEADCHNL RV SENSING INTR AMPL: 10.125 mV
MDC IDC MSMT LEADCHNL RV SENSING INTR AMPL: 10.125 mV
MDC IDC SET LEADCHNL LV PACING PULSEWIDTH: 0.8 ms
MDC IDC SET LEADCHNL RA PACING AMPLITUDE: 1.5 V
MDC IDC STAT BRADY AP VS PERCENT: 0.01 %
MDC IDC STAT BRADY AS VP PERCENT: 98.71 %
MDC IDC STAT BRADY RV PERCENT PACED: 7.44 %

## 2018-12-03 ENCOUNTER — Other Ambulatory Visit: Payer: Self-pay | Admitting: Physician Assistant

## 2018-12-03 DIAGNOSIS — E119 Type 2 diabetes mellitus without complications: Secondary | ICD-10-CM

## 2018-12-04 ENCOUNTER — Ambulatory Visit: Payer: Medicare PPO | Admitting: Family Medicine

## 2018-12-04 ENCOUNTER — Encounter: Payer: Self-pay | Admitting: Family Medicine

## 2018-12-04 VITALS — BP 102/62 | HR 73 | Temp 97.6°F | Resp 16 | Wt 133.0 lb

## 2018-12-04 DIAGNOSIS — R5383 Other fatigue: Secondary | ICD-10-CM

## 2018-12-04 DIAGNOSIS — E119 Type 2 diabetes mellitus without complications: Secondary | ICD-10-CM

## 2018-12-04 DIAGNOSIS — R0609 Other forms of dyspnea: Secondary | ICD-10-CM | POA: Diagnosis not present

## 2018-12-04 DIAGNOSIS — R0600 Dyspnea, unspecified: Secondary | ICD-10-CM

## 2018-12-04 LAB — POCT UA - MICROALBUMIN: Microalbumin Ur, POC: NEGATIVE mg/L

## 2018-12-04 LAB — POCT GLYCOSYLATED HEMOGLOBIN (HGB A1C)
Est. average glucose Bld gHb Est-mCnc: 166
Hemoglobin A1C: 7.4 % — AB (ref 4.0–5.6)

## 2018-12-04 NOTE — Patient Instructions (Signed)
.   Please review the attached list of medications and notify my office if there are any errors.   . Please bring all of your medications to every appointment so we can make sure that our medication list is the same as yours.   

## 2018-12-04 NOTE — Progress Notes (Signed)
Patient: Bethany [name] Sample Female    DOB: Jul 08, 1946   73 y.o.   MRN: [id number] Visit Date: 12/04/2018  Today's Provider: [name], MD   Chief Complaint  Patient presents with  . Diabetes  . Congestive Heart Failure  . Hypertension   Subjective:     HPI  Diabetes Mellitus Type II, Follow-up:   Lab Results  Component Value Date   HGBA1C 6.6 (A) 08/01/2018   HGBA1C 8.2 (H) 04/04/2018   HGBA1C 7.9 10/09/2017    Last seen for diabetes 4 months ago.  Management since then includes no changes. She reports good compliance with treatment. She is not having side effects.  Current symptoms include none and have been stable. Home blood sugar records: fasting range: 95  Episodes of hypoglycemia? no   Current Insulin Regimen: none Most Recent Eye Exam: >1 year ago Weight trend: fluctuating a bit Prior visit with dietician: no Current diet: well balanced Current exercise: none  Pertinent Labs:    Component Value Date/Time   CHOL 115 04/04/2018 1502   CHOL 130 02/21/2014 0014   TRIG 100 04/04/2018 1502   TRIG 94 02/21/2014 0014   HDL 54 04/04/2018 1502   HDL 42 02/21/2014 0014   LDLCALC 41 04/04/2018 1502   LDLCALC 69 02/21/2014 0014   CREATININE 0.78 06/18/2018 1144   CREATININE 0.70 02/04/2015 1426    Wt Readings from Last 3 Encounters:  12/04/18 133 lb (60.3 kg)  09/01/18 124 lb (56.2 kg)  08/29/18 128 lb 3.2 oz (58.2 kg)    ------------------------------------------------------------------------  Hypertension, follow-up:  BP Readings from Last 3 Encounters:  12/04/18 102/62  09/01/18 108/60  08/29/18 102/64    She was last seen for hypertension 8 months ago.  BP at that visit was 94/54. Management since that visit includes no changes. She reports good compliance with treatment. She is not having side effects.  She is not exercising. She is not adherent to low salt diet.   Outside blood pressures are averaging 101/74. She is experiencing  chest pain, dyspnea and fatigue.  Patient denies chest pressure/discomfort, claudication, irregular heart beat, orthopnea, palpitations, paroxysmal nocturnal dyspnea, syncope and tachypnea.   Cardiovascular risk factors include advanced age (older than 9 for men, 74 for women), diabetes mellitus and hypertension.  Use of agents associated with hypertension: NSAIDS.     Weight trend: fluctuating a bit Wt Readings from Last 3 Encounters:  12/04/18 133 lb (60.3 kg)  09/01/18 124 lb (56.2 kg)  08/29/18 128 lb 3.2 oz (58.2 kg)    Current diet: well balanced  ------------------------------------------------------------------------  Follow up for CHF:  The patient was last seen for this 5 months ago. Changes made at last visit include none.  She reports good compliance with treatment. She feels that condition is stable. She is not having side effects.  She has felt more fatigued and short of breath with exertion over the last couple of weeks. She is taking all her medications including Entresto consistently.  ------------------------------------------------------------------------------------   Allergies  Allergen Reactions  . Other Other (See Comments)    NO BLOOD PRODUCTS- JEHOVAHS WITNESS  . Sulfa Antibiotics Other (See Comments)    Loss of taste     Current Outpatient Medications:  .  acetaminophen (TYLENOL) 500 MG tablet, Take 1,000 mg by mouth 2 (two) times daily as needed for mild pain or moderate pain., Disp: , Rfl:  .  Alcohol Swabs (B-D SINGLE USE SWABS REGULAR) PADS, USE TO  CHECK BLOOD SUGAR DAILY, Disp: 100 each, Rfl: 4 .  aspirin EC 81 MG tablet, Take 81 mg by mouth daily., Disp: , Rfl:  .  atorvastatin (LIPITOR) 20 MG tablet, TAKE 1 TABLET EVERY DAY, Disp: 90 tablet, Rfl: 4 .  carvedilol (COREG) 6.25 MG tablet, Take 1/2 tablet (3.125 mg) by mouth TWICE daily, Disp: , Rfl:  .  ferrous sulfate 325 (65 FE) MG tablet, Take 325 mg by mouth 2 (two) times daily with a  meal., Disp: , Rfl:  .  furosemide (LASIX) 40 MG tablet, Take 20 mg by mouth daily. , Disp: , Rfl:  .  glipiZIDE (GLIPIZIDE XL) 2.5 MG 24 hr tablet, Take 1 tablet (2.5 mg total) by mouth daily with breakfast., Disp: 30 tablet, Rfl: 5 .  JANUMET XR [phone] MG TB24, TAKE 1 TABLET DAILY. REPLACES JANUVIA AND METFORMIN, Disp: 90 tablet, Rfl: 3 .  levothyroxine (SYNTHROID) 50 MCG tablet, Take 1 tablet (50 mcg total) by mouth daily., Disp: 30 tablet, Rfl: 1 .  pantoprazole (PROTONIX) 40 MG tablet, TAKE 1 TABLET EVERY DAY, Disp: 90 tablet, Rfl: 3 .  sacubitril-valsartan (ENTRESTO) 24-26 MG, Take 0.5 tablets by mouth 2 (two) times daily., Disp: , Rfl:  .  senna (SENOKOT) 8.6 MG TABS tablet, Take 1 tablet by mouth daily as needed for mild constipation., Disp: , Rfl:  .  TRUE METRIX BLOOD GLUCOSE TEST test strip, CHECK BLOOD SUGAR EVERY DAY, Disp: 100 each, Rfl: 3 .  TRUEPLUS LANCETS 33G MISC, CHECK BLOOD SUGAR ONE TIME A DAY, Disp: 100 each, Rfl: 4 .  Vitamin D, Ergocalciferol, (DRISDOL) 50000 units CAPS capsule, TAKE 1 CAPSULE EVERY 7 DAYS, Disp: 12 capsule, Rfl: 3  Review of Systems  Constitutional: Positive for fatigue. Negative for appetite change, chills and fever.  Respiratory: Positive for shortness of breath. Negative for chest tightness.   Cardiovascular: Positive for chest pain. Negative for palpitations.  Gastrointestinal: Negative for abdominal pain, nausea and vomiting.  Neurological: Negative for dizziness and weakness.    Social History   Tobacco Use  . Smoking status: Never Smoker  . Smokeless tobacco: Former Neurosurgeon    Types: Snuff  Substance Use Topics  . Alcohol use: No    Alcohol/week: 0.0 standard drinks      Objective:   BP 102/62 (BP Location: Left Arm, Patient Position: Sitting, Cuff Size: Normal)   Pulse 73   Temp 97.6 F (36.4 C) (Oral)   Resp 16   Wt 133 lb (60.3 kg)   LMP  (LMP Unknown)   SpO2 94% Comment: room air  BMI 25.13 kg/m  Vitals:   12/04/18 1139    BP: 102/62  Pulse: 73  Resp: 16  Temp: 97.6 F (36.4 C)  TempSrc: Oral  SpO2: 94%  Weight: 133 lb (60.3 kg)     Physical Exam   General Appearance:    Alert, cooperative, no distress  Eyes:    PERRL, conjunctiva/corneas clear, EOM's intact       Lungs:     Clear to auscultation bilaterally, respirations unlabored  Heart:    Regular rate and rhythm  Neurologic:   Awake, alert, oriented x 3. No apparent focal neurological           defect.         Results for orders placed or performed in visit on 12/04/18  POCT HgB A1C  Result Value Ref Range   Hemoglobin A1C 7.4 (A) 4.0 - 5.6 %   HbA1c POC (<>  result, manual entry)     HbA1c, POC (prediabetic range)     HbA1c, POC (controlled diabetic range)     Est. average glucose Bld gHb Est-mCnc 166   POCT UA - Microalbumin  Result Value Ref Range   Microalbumin Ur, POC negative mg/L   Creatinine, POC n/a mg/dL   Albumin/Creatinine Ratio, Urine, POC n/a        Assessment & Plan    1. Type 2 diabetes mellitus without complication, without long-term current use of insulin (HCC) Well controlled.  Continue current medications.   - POCT HgB A1C - POCT UA - Microalbumin - Comprehensive metabolic panel  2. Dyspnea on exertion Likely secondary to CHF, she does have follow up with Dr. Gwen Pounds next week.  - CBC - Brain natriuretic peptide  3. Other fatigue  - Comprehensive metabolic panel     [name], MD  [facility]

## 2018-12-05 LAB — COMPREHENSIVE METABOLIC PANEL
A/G RATIO: 1.6 (ref 1.2–2.2)
ALBUMIN: 4.2 g/dL (ref 3.7–4.7)
ALT: 19 IU/L (ref 0–32)
AST: 25 IU/L (ref 0–40)
Alkaline Phosphatase: 102 IU/L (ref 39–117)
BUN/Creatinine Ratio: 15 (ref 12–28)
BUN: 11 mg/dL (ref 8–27)
Bilirubin Total: 0.2 mg/dL (ref 0.0–1.2)
CO2: 25 mmol/L (ref 20–29)
Calcium: 9.3 mg/dL (ref 8.7–10.3)
Chloride: 101 mmol/L (ref 96–106)
Creatinine, Ser: 0.75 mg/dL (ref 0.57–1.00)
GFR calc Af Amer: 92 mL/min/{1.73_m2} (ref >59)
GFR, EST NON AFRICAN AMERICAN: 80 mL/min/{1.73_m2} (ref >59)
GLOBULIN, TOTAL: 2.7 g/dL (ref 1.5–4.5)
Glucose: 190 mg/dL — ABNORMAL HIGH (ref 65–99)
POTASSIUM: 3.8 mmol/L (ref 3.5–5.2)
SODIUM: 142 mmol/L (ref 134–144)
Total Protein: 6.9 g/dL (ref 6.0–8.5)

## 2018-12-05 LAB — CBC
Hematocrit: 34.2 % (ref 34.0–46.6)
Hemoglobin: 11.4 g/dL (ref 11.1–15.9)
MCH: 29.5 pg (ref 26.6–33.0)
MCHC: 33.3 g/dL (ref 31.5–35.7)
MCV: 89 fL (ref 79–97)
PLATELETS: 114 10*3/uL — AB (ref 150–450)
RBC: 3.86 x10E6/uL (ref 3.77–5.28)
RDW: 12.3 % (ref 11.7–15.4)
WBC: 3.2 10*3/uL — ABNORMAL LOW (ref 3.4–10.8)

## 2018-12-05 LAB — BRAIN NATRIURETIC PEPTIDE: BNP: 16.1 pg/mL (ref 0.0–100.0)

## 2018-12-11 DIAGNOSIS — I5022 Chronic systolic (congestive) heart failure: Secondary | ICD-10-CM | POA: Diagnosis not present

## 2018-12-11 DIAGNOSIS — I10 Essential (primary) hypertension: Secondary | ICD-10-CM | POA: Diagnosis not present

## 2018-12-11 DIAGNOSIS — E782 Mixed hyperlipidemia: Secondary | ICD-10-CM | POA: Diagnosis not present

## 2018-12-12 ENCOUNTER — Telehealth: Payer: Self-pay

## 2018-12-12 NOTE — Telephone Encounter (Signed)
Pt advised.   Thanks,   -Wyland Rastetter  

## 2018-12-12 NOTE — Telephone Encounter (Signed)
-----   Message from Malva Limes, MD sent at 12/12/2018  1:37 PM EST ----- Blood sugar, kidney functions, electrolytes and cardiac functions all normal. Continue current medications.  Follow up in May as scheduled.

## 2018-12-20 ENCOUNTER — Other Ambulatory Visit: Payer: Self-pay | Admitting: Family Medicine

## 2019-01-01 DIAGNOSIS — I5022 Chronic systolic (congestive) heart failure: Secondary | ICD-10-CM | POA: Diagnosis not present

## 2019-01-02 ENCOUNTER — Ambulatory Visit (INDEPENDENT_AMBULATORY_CARE_PROVIDER_SITE_OTHER): Payer: Medicare PPO | Admitting: *Deleted

## 2019-01-02 DIAGNOSIS — I5022 Chronic systolic (congestive) heart failure: Secondary | ICD-10-CM

## 2019-01-02 DIAGNOSIS — I428 Other cardiomyopathies: Secondary | ICD-10-CM

## 2019-01-03 LAB — CUP PACEART REMOTE DEVICE CHECK
Battery Voltage: 2.93 V
Brady Statistic AP VP Percent: 0.02 %
Brady Statistic AP VS Percent: 0.01 %
Brady Statistic AS VP Percent: 98.71 %
Brady Statistic AS VS Percent: 1.25 %
Brady Statistic RA Percent Paced: 0.04 %
Brady Statistic RV Percent Paced: 7.25 %
Date Time Interrogation Session: 20200226093624
HIGH POWER IMPEDANCE MEASURED VALUE: 53 Ohm
Implantable Lead Implant Date: 20160409
Implantable Lead Implant Date: 20160409
Implantable Lead Location: 753858
Implantable Lead Location: 753859
Implantable Lead Location: 753860
Implantable Lead Model: 5076
Implantable Pulse Generator Implant Date: 20160409
Lead Channel Impedance Value: 1026 Ohm
Lead Channel Impedance Value: 1064 Ohm
Lead Channel Impedance Value: 1140 Ohm
Lead Channel Impedance Value: 361 Ohm
Lead Channel Impedance Value: 399 Ohm
Lead Channel Impedance Value: 456 Ohm
Lead Channel Impedance Value: 475 Ohm
Lead Channel Impedance Value: 551 Ohm
Lead Channel Impedance Value: 646 Ohm
Lead Channel Impedance Value: 703 Ohm
Lead Channel Impedance Value: 988 Ohm
Lead Channel Pacing Threshold Amplitude: 0.375 V
Lead Channel Pacing Threshold Amplitude: 0.75 V
Lead Channel Pacing Threshold Amplitude: 1.875 V
Lead Channel Pacing Threshold Pulse Width: 0.4 ms
Lead Channel Pacing Threshold Pulse Width: 0.8 ms
Lead Channel Sensing Intrinsic Amplitude: 0.375 mV
Lead Channel Sensing Intrinsic Amplitude: 0.375 mV
Lead Channel Sensing Intrinsic Amplitude: 10.5 mV
Lead Channel Setting Pacing Amplitude: 2 V
Lead Channel Setting Pacing Amplitude: 2.5 V
Lead Channel Setting Pacing Pulse Width: 0.4 ms
Lead Channel Setting Sensing Sensitivity: 0.3 mV
MDC IDC LEAD IMPLANT DT: 20160409
MDC IDC MSMT BATTERY REMAINING LONGEVITY: 25 mo
MDC IDC MSMT LEADCHNL LV IMPEDANCE VALUE: 874 Ohm
MDC IDC MSMT LEADCHNL LV IMPEDANCE VALUE: 988 Ohm
MDC IDC MSMT LEADCHNL RA PACING THRESHOLD PULSEWIDTH: 0.4 ms
MDC IDC MSMT LEADCHNL RV SENSING INTR AMPL: 10.5 mV
MDC IDC SET LEADCHNL LV PACING PULSEWIDTH: 0.8 ms
MDC IDC SET LEADCHNL RA PACING AMPLITUDE: 1.5 V

## 2019-01-08 DIAGNOSIS — E782 Mixed hyperlipidemia: Secondary | ICD-10-CM | POA: Diagnosis not present

## 2019-01-08 DIAGNOSIS — I10 Essential (primary) hypertension: Secondary | ICD-10-CM | POA: Diagnosis not present

## 2019-01-08 DIAGNOSIS — I5022 Chronic systolic (congestive) heart failure: Secondary | ICD-10-CM | POA: Diagnosis not present

## 2019-01-09 ENCOUNTER — Encounter: Payer: Self-pay | Admitting: Cardiology

## 2019-01-09 NOTE — Progress Notes (Signed)
Remote ICD transmission.   

## 2019-01-29 ENCOUNTER — Other Ambulatory Visit: Payer: Self-pay

## 2019-01-29 DIAGNOSIS — E559 Vitamin D deficiency, unspecified: Secondary | ICD-10-CM

## 2019-01-29 DIAGNOSIS — K219 Gastro-esophageal reflux disease without esophagitis: Secondary | ICD-10-CM

## 2019-01-29 MED ORDER — PANTOPRAZOLE SODIUM 40 MG PO TBEC: 40.0000 mg | DELAYED_RELEASE_TABLET | Freq: Every day | ORAL | 3 refills | Status: DC

## 2019-01-29 MED ORDER — VITAMIN D (ERGOCALCIFEROL) 1.25 MG (50000 UNIT) PO CAPS: 50000.0000 [IU] | ORAL_CAPSULE | ORAL | 3 refills | Status: DC

## 2019-01-29 NOTE — Telephone Encounter (Signed)
Patient requesting refills. Patient is completely out of pantoprazole. Please advise. [facility].

## 2019-01-29 NOTE — Telephone Encounter (Signed)
Refills sent to [facility],   -Vernona Rieger

## 2019-02-18 ENCOUNTER — Telehealth: Payer: Self-pay

## 2019-02-18 ENCOUNTER — Other Ambulatory Visit: Payer: Self-pay | Admitting: *Deleted

## 2019-02-18 DIAGNOSIS — E119 Type 2 diabetes mellitus without complications: Secondary | ICD-10-CM

## 2019-02-18 MED ORDER — GLIPIZIDE ER 2.5 MG PO TB24: 2.5000 mg | ORAL_TABLET | Freq: Every day | ORAL | 5 refills | Status: DC

## 2019-02-18 NOTE — Telephone Encounter (Signed)
Patient called in and requested a refill on her glipizide and medication send into the pharmacy.

## 2019-02-18 NOTE — Telephone Encounter (Signed)
Patient would like rx glipizide resent to [facility].

## 2019-02-19 MED ORDER — GLIPIZIDE ER 2.5 MG PO TB24: 2.5000 mg | ORAL_TABLET | Freq: Every day | ORAL | 1 refills | Status: DC

## 2019-03-07 ENCOUNTER — Telehealth: Payer: Self-pay

## 2019-03-07 ENCOUNTER — Emergency Department
Admission: EM | Admit: 2019-03-07 | Discharge: 2019-03-07 | Disposition: A | Discharge status: Home / Self Care | Payer: Medicare PPO | Attending: Emergency Medicine | Admitting: Emergency Medicine

## 2019-03-07 ENCOUNTER — Emergency Department: Payer: Medicare PPO

## 2019-03-07 ENCOUNTER — Other Ambulatory Visit: Payer: Self-pay

## 2019-03-07 DIAGNOSIS — R0789 Other chest pain: Secondary | ICD-10-CM | POA: Diagnosis not present

## 2019-03-07 DIAGNOSIS — R0602 Shortness of breath: Secondary | ICD-10-CM | POA: Diagnosis not present

## 2019-03-07 DIAGNOSIS — I110 Hypertensive heart disease with heart failure: Secondary | ICD-10-CM | POA: Diagnosis not present

## 2019-03-07 DIAGNOSIS — E119 Type 2 diabetes mellitus without complications: Secondary | ICD-10-CM | POA: Insufficient documentation

## 2019-03-07 DIAGNOSIS — R9431 Abnormal electrocardiogram [ECG] [EKG]: Secondary | ICD-10-CM | POA: Diagnosis not present

## 2019-03-07 DIAGNOSIS — Z79899 Other long term (current) drug therapy: Secondary | ICD-10-CM | POA: Insufficient documentation

## 2019-03-07 DIAGNOSIS — I509 Heart failure, unspecified: Secondary | ICD-10-CM | POA: Insufficient documentation

## 2019-03-07 DIAGNOSIS — R410 Disorientation, unspecified: Secondary | ICD-10-CM | POA: Insufficient documentation

## 2019-03-07 DIAGNOSIS — R531 Weakness: Secondary | ICD-10-CM

## 2019-03-07 LAB — BASIC METABOLIC PANEL
Anion gap: 9 (ref 5–15)
BUN: 13 mg/dL (ref 8–23)
CO2: 29 mmol/L (ref 22–32)
Calcium: 9.4 mg/dL (ref 8.9–10.3)
Chloride: 101 mmol/L (ref 98–111)
Creatinine, Ser: 0.77 mg/dL (ref 0.44–1.00)
GFR calc Af Amer: >60 mL/min (ref >60)
GFR calc non Af Amer: >60 mL/min (ref >60)
Glucose, Bld: 118 mg/dL — ABNORMAL HIGH (ref 70–99)
Potassium: 3.8 mmol/L (ref 3.5–5.1)
Sodium: 139 mmol/L (ref 135–145)

## 2019-03-07 LAB — URINALYSIS, COMPLETE (UACMP) WITH MICROSCOPIC
Bacteria, UA: NONE SEEN
Bilirubin Urine: NEGATIVE
Glucose, UA: NEGATIVE mg/dL
Hgb urine dipstick: NEGATIVE
Ketones, ur: NEGATIVE mg/dL
Leukocytes,Ua: NEGATIVE
Nitrite: NEGATIVE
Protein, ur: NEGATIVE mg/dL
Specific Gravity, Urine: 1.003 — ABNORMAL LOW (ref 1.005–1.030)
Squamous Epithelial / LPF: NONE SEEN (ref 0–5)
WBC, UA: NONE SEEN WBC/hpf (ref 0–5)
pH: 7 (ref 5.0–8.0)

## 2019-03-07 LAB — CBC WITH DIFFERENTIAL/PLATELET
Abs Immature Granulocytes: 0.01 10*3/uL (ref 0.00–0.07)
Basophils Absolute: 0 10*3/uL (ref 0.0–0.1)
Basophils Relative: 0 %
Eosinophils Absolute: 0.1 10*3/uL (ref 0.0–0.5)
Eosinophils Relative: 2 %
HCT: 35.3 % — ABNORMAL LOW (ref 36.0–46.0)
Hemoglobin: 11.2 g/dL — ABNORMAL LOW (ref 12.0–15.0)
Immature Granulocytes: 0 %
Lymphocytes Relative: 44 %
Lymphs Abs: 1.5 10*3/uL (ref 0.7–4.0)
MCH: 29.5 pg (ref 26.0–34.0)
MCHC: 31.7 g/dL (ref 30.0–36.0)
MCV: 92.9 fL (ref 80.0–100.0)
Monocytes Absolute: 0.4 10*3/uL (ref 0.1–1.0)
Monocytes Relative: 11 %
Neutro Abs: 1.5 10*3/uL — ABNORMAL LOW (ref 1.7–7.7)
Neutrophils Relative %: 43 %
Platelets: 101 10*3/uL — ABNORMAL LOW (ref 150–400)
RBC: 3.8 MIL/uL — ABNORMAL LOW (ref 3.87–5.11)
RDW: 12.6 % (ref 11.5–15.5)
WBC: 3.4 10*3/uL — ABNORMAL LOW (ref 4.0–10.5)
nRBC: 0 % (ref 0.0–0.2)

## 2019-03-07 LAB — TROPONIN I: Troponin I: <0.03 ng/mL (ref <0.03)

## 2019-03-07 NOTE — ED Triage Notes (Signed)
Pt arrived POV c/o SOB that started a couple of weeks ago that has gotten worse. Pt c/o of back pain that started a few days ago. Slightly nauseous with some chest heaviness.

## 2019-03-07 NOTE — ED Provider Notes (Signed)
[facility] Emergency Department Provider Note   ____________________________________________   I have reviewed the triage vital signs and the nursing notes.   HISTORY  Chief Complaint Weakness  History limited by: Not Limited   HPI Bethany [name] is a 73 y.o. female who presents to the emergency department today with multiple medical complaints. Chief complaint for weakness. States she has felt weak in her legs for the past few weeks. She has also noticed some shortness of breath, this is worse with lying flat. She has had associated chest pressure. Has also felt like she has had some confusion. Also complaining of some back pain. Had diarrhea a couple of days ago. The patient called her PCP to schedule an appointment however they were unable to get her in. Stated that they told her to be evaluated in the emergency department. Denies any cough. Denies any fevers.    Records reviewed. Per medical record review patient has a history of CHF, diabetes.   Past Medical History:  Diagnosis Date  . AICD (automatic cardioverter/defibrillator) present   . Anemia   . Anxiety   . CHF (congestive heart failure) ([place])   . Complication of anesthesia   . Depression   . Diabetes ([place])   . Dilated cardiomyopathy ([place])   . Diverticulosis   . Dyspnea   . Dysrhythmia   . Edema    FEET/ANKLES OCCAS  . GERD (gastroesophageal reflux disease)   . Hyperlipidemia   . Hypothyroidism   . LV dysfunction   . Orthopnea    USES 3 PILLOWS  . PONV (postoperative nausea and vomiting)   . Reflux   . Thrombocytopenia ([place])   . Thyroid disease   . VHD (valvular heart disease)     Patient Active Problem List   Diagnosis Date Noted  . Memory loss 07/12/2016  . GERD (gastroesophageal reflux disease) 08/24/2015  . Automatic implantable cardioverter-defibrillator in situ 07/29/2015  . Hypotension 06/18/2015  . Benign essential HTN 03/20/2015  . Absolute anemia 03/11/2015  .  Abnormal liver enzymes 03/11/2015  . Decreased potassium in the blood 03/11/2015  . Hypercholesteremia 03/11/2015  . Adult hypothyroidism 03/11/2015  . Idiopathic insomnia 03/11/2015  . OP (osteoporosis) 03/11/2015  . Diabetes mellitus, type 2 ([place]) 03/11/2015  . Vitamin D deficiency 03/11/2015  . LBBB (left bundle branch block) 02/11/2015  . Paroxysmal supraventricular tachycardia ([place]) 11/25/2014  . Chronic systolic heart failure ([place]) 09/25/2014  . Heart valve disease 04/10/2014    Past Surgical History:  Procedure Laterality Date  . ABDOMINAL HYSTERECTOMY    . BI-VENTRICULAR IMPLANTABLE CARDIOVERTER DEFIBRILLATOR [name] 02/11/2015   Procedure: BI-VENTRICULAR IMPLANTABLE CARDIOVERTER DEFIBRILLATOR  (CRT-D);  Surgeon: Deboraha Sprang, MD;  Location: [facility] CATH LAB;  Service: Cardiovascular;  Laterality: [name];  . BONE MARROW BIOPSY  2007   spine  . CARDIAC CATHETERIZATION    . CATARACT EXTRACTION W/PHACO Right 01/03/2017   Procedure: CATARACT EXTRACTION PHACO AND INTRAOCULAR LENS PLACEMENT (IOC);  Surgeon: Leandrew Koyanagi, MD;  Location: [facility] ORS;  Service: Ophthalmology;  Laterality: Right;  [place] 00:50 AP% 19.5 CDE 9.94 fluid pack lot # [id number]  . CATARACT EXTRACTION W/PHACO Left 10/27/2016   Procedure: CATARACT EXTRACTION PHACO AND INTRAOCULAR LENS PLACEMENT (IOC);  Surgeon: Leandrew Koyanagi, MD;  Location: [facility] ORS;  Service: Ophthalmology;  Laterality: Left;  [place]  01:12 AP%  21.0 CDE  11.59 Fluid pack lot # [id number]  . CHOLECYSTECTOMY    . INSERT / REPLACE / REMOVE PACEMAKER     AICD  .  LEAD REVISION [name] 02/11/2015   Procedure: LEAD REVISION;  Surgeon: Deboraha Sprang, MD;  Location[facility] LAB;  Service: Cardiovascular;  Laterality: [name];  . PARTIAL HYSTERECTOMY  1977  . TOOTH EXTRACTION  09/23/2015    Prior to Admission medications   Medication Sig Start Date End Date Taking? Authorizing Provider  acetaminophen (TYLENOL) 500 MG tablet Take 1,000 mg by mouth 2 (two) times  daily as needed for mild pain or moderate pain.    [provider]  Alcohol Swabs (B-D SINGLE USE SWABS REGULAR) PADS USE TO CHECK BLOOD SUGAR DAILY 10/03/18   Birdie Sons, MD  aspirin EC 81 MG tablet Take 81 mg by mouth daily.    [provider]  atorvastatin (LIPITOR) 20 MG tablet TAKE 1 TABLET EVERY DAY 12/04/18   Birdie Sons, MD  carvedilol (COREG) 6.25 MG tablet Take 1/2 tablet (3.125 mg) by mouth TWICE daily 03/26/15   [provider]  ferrous sulfate 325 (65 FE) MG tablet Take 325 mg by mouth 2 (two) times daily with a meal.    [provider]  furosemide (LASIX) 40 MG tablet Take 20 mg by mouth daily.     [provider]  glipiZIDE (GLIPIZIDE XL) 2.5 MG 24 hr tablet Take 1 tablet (2.5 mg total) by mouth daily with breakfast. 02/19/19   Birdie Sons, MD  JANUMET XR [phone] MG TB24 TAKE 1 TABLET DAILY. REPLACES JANUVIA AND METFORMIN 12/20/18   Birdie Sons, MD  levothyroxine (SYNTHROID) 50 MCG tablet Take 1 tablet (50 mcg total) by mouth daily. 09/10/18   Birdie Sons, MD  pantoprazole (PROTONIX) 40 MG tablet Take 1 tablet (40 mg total) by mouth daily. 01/29/19   Birdie Sons, MD  sacubitril-valsartan (ENTRESTO) 24-26 MG Take 0.5 tablets by mouth 2 (two) times daily. 04/10/18   Deboraha Sprang, MD  senna (SENOKOT) 8.6 MG TABS tablet Take 1 tablet by mouth daily as needed for mild constipation.    [provider]  TRUE METRIX BLOOD GLUCOSE TEST test strip CHECK BLOOD SUGAR EVERY DAY 04/05/18   Birdie Sons, MD  TRUEPLUS LANCETS 33G MISC CHECK BLOOD SUGAR ONE TIME A DAY 07/02/18   Birdie Sons, MD  Vitamin D, Ergocalciferol, (DRISDOL) 1.25 MG (50000 UT) CAPS capsule Take 1 capsule (50,000 Units total) by mouth every 7 (seven) days. 01/29/19   Birdie Sons, MD    Allergies Other and Sulfa antibiotics  Family History  Problem Relation Age of Onset  . Heart disease Mother   . Heart attack Mother   . Prostate  cancer Father   . Ulcers Father   . Stroke Sister   . Heart attack Sister   . Diabetes Sister   . Heart attack Brother   . Stroke Brother   . Breast cancer Neg Hx     Social History Social History   Tobacco Use  . Smoking status: Never Smoker  . Smokeless tobacco: Former Systems developer    Types: Snuff  Substance Use Topics  . Alcohol use: No    Alcohol/week: 0.0 standard drinks  . Drug use: No    Review of Systems Constitutional: No fevers. Positive for generalized weakness.  Eyes: No visual changes. ENT: No sore throat. Cardiovascular: Positive for chest pressure. Respiratory: Positive for shortness of breath. Gastrointestinal: No abdominal pain.  Positive for diarrhea.  Genitourinary: Negative for dysuria. Musculoskeletal: Positive for back pain. Skin: Negative for rash. Neurological: Negative for headaches, focal  weakness or numbness.  ____________________________________________   PHYSICAL EXAM:  VITAL SIGNS: ED Triage Vitals [03/07/19 1734]  Enc Vitals Group     BP 117/68     Pulse Rate 73     Resp 18     Temp 98.1 F (36.7 C)     Temp Source Oral     SpO2 100 %     Weight 128 lb (58.1 kg)     Height '5\' 1"'  (1.549 m)     Head Circumference      Peak Flow      Pain Score 0   Constitutional: Alert and oriented.  Eyes: Conjunctivae are normal.  ENT      Head: Normocephalic and atraumatic.      Nose: No congestion/rhinnorhea.      Mouth/Throat: Mucous membranes are moist.      Neck: No stridor. Hematological/Lymphatic/Immunilogical: No cervical lymphadenopathy. Cardiovascular: Normal rate, regular rhythm.  No murmurs, rubs, or gallops.  Respiratory: Normal respiratory effort without tachypnea nor retractions. Breath sounds are clear and equal bilaterally. No wheezes/rales/rhonchi. Gastrointestinal: Soft and non tender. No rebound. No guarding.  Genitourinary: Deferred Musculoskeletal: Normal range of motion in all extremities. No lower extremity  edema. Neurologic:  Normal speech and language. No gross focal neurologic deficits are appreciated.  Skin:  Skin is warm, dry and intact. No rash noted. Psychiatric: Mood and affect are normal. Speech and behavior are normal. Patient exhibits appropriate insight and judgment.  ____________________________________________    LABS (pertinent positives/negatives)  Trop <0.03 BMP wnl except glu 118 UA clear, not consistent with UTI CBC wbc 3.4, hgb 11.2, plt 101  ____________________________________________   EKG  I, Nance Pear, attending physician, personally viewed and interpreted this EKG  EKG Time: 1734 Rate: 71 Rhythm: atrial sensed ventricular paced rhythm Axis: right axis deviation Intervals: qtc 482 QRS: wide ST changes: no st elevation Impression: abnormal ekg  ____________________________________________    RADIOLOGY  CXR Pulmonary venous hypertension without edema  ____________________________________________   PROCEDURES  Procedures  ____________________________________________   INITIAL IMPRESSION / ASSESSMENT AND PLAN / ED COURSE  Pertinent labs & imaging results that were available during my care of the patient were reviewed by me and considered in my medical decision making (see chart for details).   Patient presented to the emergency department today with multiple medical complaints. Patients work up without concerning findings. X-ray without any pulmonary edema. At this point unclear etiology of patient's varied symptoms, however given lack of concerning findings on work up do think it reasonable for patient to continue work up as outpatient. Did discuss findings and plan with patient. Discussed return precautions.   ____________________________________________   FINAL CLINICAL IMPRESSION(S) / ED DIAGNOSES  Final diagnoses:  SOB (shortness of breath)  Weakness     Note: This dictation was prepared with Dragon dictation. Any  transcriptional errors that result from this process are unintentional     Nance Pear, MD 03/07/19 2032

## 2019-03-07 NOTE — Discharge Instructions (Addendum)
Please seek medical attention for any high fevers, chest pain, shortness of breath, change in behavior, persistent vomiting, bloody stool or any other new or concerning symptoms.  

## 2019-03-07 NOTE — ED Notes (Signed)
This RN reviewed discharge instructions with pt who verbalized understanding follow up care.

## 2019-03-07 NOTE — Telephone Encounter (Signed)
Patient was advised and states she going to call to see if her sister would take her to the ER.

## 2019-03-07 NOTE — Telephone Encounter (Signed)
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 Patient states she is having the following symptoms: weakness, SOB (while laying down), chills (kinda like hot flashes), no fevers or any other symptoms at this moment. Patient has a pace marker and I advised patient that Dr. Sherrie Mustache is out of the office today and she wants to come in to see him tomorrow morning. I also advised patient she may needs to be seen at emergency room and patient states she does not want to be seen at the ER she can wait until tomorrow to see Dr.Fisher. I offered patient appointment with another provider and she was wanting to see Dr.Fisher. Please advise.

## 2019-03-07 NOTE — Telephone Encounter (Signed)
Persistent shortness of breath and weakness without elevated temperature could be a sign her CHF is getting worse. If shortness of breath/breathing becomes labored, may need to call EMS or go the ER.

## 2019-03-13 ENCOUNTER — Ambulatory Visit: Payer: Medicare PPO | Admitting: Family Medicine

## 2019-03-13 ENCOUNTER — Other Ambulatory Visit: Payer: Self-pay

## 2019-03-13 ENCOUNTER — Encounter: Payer: Self-pay | Admitting: Family Medicine

## 2019-03-13 VITALS — BP 100/60 | HR 72 | Temp 97.5°F | Resp 18 | Wt 135.0 lb

## 2019-03-13 DIAGNOSIS — E559 Vitamin D deficiency, unspecified: Secondary | ICD-10-CM

## 2019-03-13 DIAGNOSIS — E119 Type 2 diabetes mellitus without complications: Secondary | ICD-10-CM | POA: Diagnosis not present

## 2019-03-13 DIAGNOSIS — R5383 Other fatigue: Secondary | ICD-10-CM

## 2019-03-13 DIAGNOSIS — R0600 Dyspnea, unspecified: Secondary | ICD-10-CM

## 2019-03-13 DIAGNOSIS — R252 Cramp and spasm: Secondary | ICD-10-CM

## 2019-03-13 DIAGNOSIS — R0609 Other forms of dyspnea: Secondary | ICD-10-CM | POA: Diagnosis not present

## 2019-03-13 DIAGNOSIS — E039 Hypothyroidism, unspecified: Secondary | ICD-10-CM | POA: Diagnosis not present

## 2019-03-13 LAB — POCT GLYCOSYLATED HEMOGLOBIN (HGB A1C)
Est. average glucose Bld gHb Est-mCnc: 163
Hemoglobin A1C: 7.3 % — AB (ref 4.0–5.6)

## 2019-03-13 MED ORDER — CYANOCOBALAMIN 1000 MCG/ML IJ SOLN
1000.0000 ug | Freq: Once | INTRAMUSCULAR | Status: AC
  Administered 2019-03-13: 1000 ug via INTRAMUSCULAR

## 2019-03-13 NOTE — Patient Instructions (Signed)
.   Please review the attached list of medications and notify my office if there are any errors.   . Please bring all of your medications to every appointment so we can make sure that our medication list is the same as yours.   

## 2019-03-13 NOTE — Progress Notes (Signed)
Patient: Bethany [name] Female    DOB: 06/19/1946   73 y.o.   MRN: [id number] Visit Date: 03/13/2019  Today's Provider: [name], MD   No chief complaint on file.  Subjective:     HPI  Follow up ER visit  Patient was seen in ER for weakness and shortness of breath  on 03/07/2019. Work up in the ER was negative, and patient was advised to continue outpatient workup.  Had normal troponin, was slightly anemic, but otherwise labs were normal. No acute changes on xray.  She reports good compliance with treatment. She reports this condition is Unchanged. Patient states she feels tired and has no energy. Denies feeling any more depressed than usual  Admission on 03/07/2019, Discharged on 03/07/2019  Component Date Value Ref Range Status  . WBC 03/07/2019 3.4* 4.0 - 10.5 K/uL Final  . RBC 03/07/2019 3.80* 3.87 - 5.11 MIL/uL Final  . Hemoglobin 03/07/2019 11.2* 12.0 - 15.0 g/dL Final  . HCT 95/07/3266 35.3* 36.0 - 46.0 % Final  . MCV 03/07/2019 92.9  80.0 - 100.0 fL Final  . MCH 03/07/2019 29.5  26.0 - 34.0 pg Final  . MCHC 03/07/2019 31.7  30.0 - 36.0 g/dL Final  . RDW 12/45/8099 12.6  11.5 - 15.5 % Final  . Platelets 03/07/2019 101* 150 - 400 K/uL Final   Comment: Immature Platelet Fraction may be clinically indicated, consider ordering this additional test [id number]    Performed at [facility], [address], [place], [place] [address]  . Sodium 03/07/2019 139  135 - 145 mmol/L Final  . Potassium 03/07/2019 3.8  3.5 - 5.1 mmol/L Final  . Chloride 03/07/2019 101  98 - 111 mmol/L Final  . CO2 03/07/2019 29  22 - 32 mmol/L Final  . Glucose, Bld 03/07/2019 118* 70 - 99 mg/dL Final  . BUN 34/19/3790 13  8 - 23 mg/dL Final  . Creatinine, Ser 03/07/2019 0.77  0.44 - 1.00 mg/dL Final  . Calcium 24/07/7352 9.4  8.9 - 10.3 mg/dL Final  . GFR calc non Af Amer 03/07/2019 >60  >60 mL/min Final  . GFR calc Af Amer 03/07/2019 >60  >60 mL/min Final  . Anion gap  03/07/2019 9  5 - 15 Final   Performed at [facility], [address] [place], [place] [address]  . Troponin I 03/07/2019 <0.03  <0.03 ng/mL Final   Performed at [facility], [address] [place], [place] [address]  . Color, Urine 03/07/2019 COLORLESS* YELLOW Final  . APPearance 03/07/2019 CLEAR* CLEAR Final  . Specific Gravity, Urine 03/07/2019 1.003* 1.005 - 1.030 Final  . pH 03/07/2019 7.0  5.0 - 8.0 Final  . Glucose, UA 03/07/2019 NEGATIVE  NEGATIVE mg/dL Final  . Hgb urine dipstick 03/07/2019 NEGATIVE  NEGATIVE Final  . Bilirubin Urine 03/07/2019 NEGATIVE  NEGATIVE Final  . Ketones, ur 03/07/2019 NEGATIVE  NEGATIVE mg/dL Final  . Protein, ur 19/62/2297 NEGATIVE  NEGATIVE mg/dL Final  . Nitrite 98/92/1194 NEGATIVE  NEGATIVE Final  . Leukocytes,Ua 03/07/2019 NEGATIVE  NEGATIVE Final  . WBC, UA 03/07/2019 NONE SEEN  0 - 5 WBC/hpf Final  . Bacteria, UA 03/07/2019 NONE SEEN  NONE SEEN Final  . Squamous Epithelial / LPF 03/07/2019 NONE SEEN  0 - 5 Final   Performed at [facility], [name] [place], [place] [address]     ------------------------------------------------------------------------------------   Allergies  Allergen Reactions  . Other Other (See Comments)  NO BLOOD PRODUCTS- JEHOVAHS WITNESS  . Sulfa Antibiotics Other (See Comments)    Loss of taste     Current Outpatient Medications:  .  acetaminophen (TYLENOL) 500 MG tablet, Take 1,000 mg by mouth 2 (two) times daily as needed for mild pain or moderate pain., Disp: , Rfl:  .  Alcohol Swabs (B-D SINGLE USE SWABS REGULAR) PADS, USE TO CHECK BLOOD SUGAR DAILY, Disp: 100 each, Rfl: 4 .  aspirin EC 81 MG tablet, Take 81 mg by mouth daily., Disp: , Rfl:  .  atorvastatin (LIPITOR) 20 MG tablet, TAKE 1 TABLET EVERY DAY, Disp: 90 tablet, Rfl: 4 .  carvedilol (COREG) 6.25 MG tablet, Take 1/2 tablet (3.125 mg) by mouth TWICE daily, Disp: , Rfl:  .  ferrous sulfate 325 (65 FE) MG  tablet, Take 325 mg by mouth 2 (two) times daily with a meal., Disp: , Rfl:  .  furosemide (LASIX) 40 MG tablet, Take 20 mg by mouth daily. , Disp: , Rfl:  .  glipiZIDE (GLIPIZIDE XL) 2.5 MG 24 hr tablet, Take 1 tablet (2.5 mg total) by mouth daily with breakfast., Disp: 90 tablet, Rfl: 1 .  JANUMET XR [phone] MG TB24, TAKE 1 TABLET DAILY. REPLACES JANUVIA AND METFORMIN, Disp: 90 tablet, Rfl: 4 .  levothyroxine (SYNTHROID) 50 MCG tablet, Take 1 tablet (50 mcg total) by mouth daily., Disp: 30 tablet, Rfl: 1 .  pantoprazole (PROTONIX) 40 MG tablet, Take 1 tablet (40 mg total) by mouth daily., Disp: 90 tablet, Rfl: 3 .  sacubitril-valsartan (ENTRESTO) 24-26 MG, Take 0.5 tablets by mouth 2 (two) times daily., Disp: , Rfl:  .  senna (SENOKOT) 8.6 MG TABS tablet, Take 1 tablet by mouth daily as needed for mild constipation., Disp: , Rfl:  .  TRUE METRIX BLOOD GLUCOSE TEST test strip, CHECK BLOOD SUGAR EVERY DAY, Disp: 100 each, Rfl: 3 .  TRUEPLUS LANCETS 33G MISC, CHECK BLOOD SUGAR ONE TIME A DAY, Disp: 100 each, Rfl: 4 .  Vitamin D, Ergocalciferol, (DRISDOL) 1.25 MG (50000 UT) CAPS capsule, Take 1 capsule (50,000 Units total) by mouth every 7 (seven) days., Disp: 12 capsule, Rfl: 3  Review of Systems  Constitutional: Positive for fever. Negative for appetite change, chills and fatigue.  Respiratory: Positive for shortness of breath. Negative for chest tightness.   Cardiovascular: Negative for chest pain and palpitations.  Gastrointestinal: Negative for abdominal pain, nausea and vomiting.  Neurological: Positive for weakness. Negative for dizziness.    Social History   Tobacco Use  . Smoking status: Never Smoker  . Smokeless tobacco: Former Neurosurgeon    Types: Snuff  Substance Use Topics  . Alcohol use: No    Alcohol/week: 0.0 standard drinks      Objective:   BP 100/60 (BP Location: Left Arm, Patient Position: Sitting, Cuff Size: Normal)   Pulse 72   Temp (!) 97.5 F (36.4 C) (Oral)    Resp 18   Wt 135 lb (61.2 kg)   LMP  (LMP Unknown)   SpO2 98% Comment: room air  BMI 25.51 kg/m  Vitals:   03/13/19 0847  BP: 100/60  Pulse: 72  Resp: 18  Temp: (!) 97.5 F (36.4 C)  TempSrc: Oral  SpO2: 98%  Weight: 135 lb (61.2 kg)     Physical Exam   General Appearance:    Alert, cooperative, no distress  Eyes:    PERRL, conjunctiva/corneas clear, EOM's intact       Lungs:     Clear to  auscultation bilaterally, respirations unlabored  Heart:    Regular rate and rhythm  Neurologic:   Awake, alert, oriented x 3. No apparent focal neurological           defect.       Results for orders placed or performed in visit on 03/13/19  POCT HgB A1C  Result Value Ref Range   Hemoglobin A1C 7.3 (A) 4.0 - 5.6 %   HbA1c POC (<> result, manual entry)     HbA1c, POC (prediabetic range)     HbA1c, POC (controlled diabetic range)     Est. average glucose Bld gHb Est-mCnc 163        Assessment & Plan    1. Fatigue, unspecified type  - cyanocobalamin ((VITAMIN B-12)) injection 1,000 mcg  2. Type 2 diabetes mellitus without complication, without long-term current use of insulin (HCC)  - POCT HgB A1C  3. Leg cramps  - Magnesium  4. Adult hypothyroidism  - TSH - T4, free  5. Vitamin D deficiency  - VITAMIN D 25 Hydroxy (Vit-D Deficiency, Fractures)  6. Dyspnea on exertion  - Brain natriuretic peptide     [name], MD  [facility]

## 2019-03-14 LAB — VITAMIN D 25 HYDROXY (VIT D DEFICIENCY, FRACTURES): Vit D, 25-Hydroxy: 83.2 ng/mL (ref 30.0–100.0)

## 2019-03-14 LAB — TSH: TSH: 1.89 u[IU]/mL (ref 0.450–4.500)

## 2019-03-14 LAB — T4, FREE: Free T4: 1.43 ng/dL (ref 0.82–1.77)

## 2019-03-14 LAB — BRAIN NATRIURETIC PEPTIDE: BNP: 17.8 pg/mL (ref 0.0–100.0)

## 2019-03-14 LAB — MAGNESIUM: Magnesium: 1.6 mg/dL (ref 1.6–2.3)

## 2019-04-03 ENCOUNTER — Ambulatory Visit: Payer: Medicare PPO | Admitting: Family Medicine

## 2019-04-03 ENCOUNTER — Encounter: Payer: Medicare PPO | Admitting: *Deleted

## 2019-04-03 ENCOUNTER — Other Ambulatory Visit: Payer: Self-pay

## 2019-04-03 VITALS — BP 108/62 | HR 72 | Temp 97.9°F | Resp 14 | Ht 61.0 in | Wt 131.0 lb

## 2019-04-03 DIAGNOSIS — R5383 Other fatigue: Secondary | ICD-10-CM

## 2019-04-03 DIAGNOSIS — I5022 Chronic systolic (congestive) heart failure: Secondary | ICD-10-CM | POA: Diagnosis not present

## 2019-04-03 DIAGNOSIS — E119 Type 2 diabetes mellitus without complications: Secondary | ICD-10-CM | POA: Diagnosis not present

## 2019-04-03 MED ORDER — CARVEDILOL 3.125 MG PO TABS: ORAL_TABLET | ORAL | 1 refills | Status: DC

## 2019-04-03 NOTE — Patient Instructions (Signed)
.   Please review the attached list of medications and notify my office if there are any errors.   . Please bring all of your medications to every appointment so we can make sure that our medication list is the same as yours.   

## 2019-04-03 NOTE — Progress Notes (Signed)
Patient: Bethany [name] Female    DOB: 1946/04/06   73 y.o.   MRN: [id number] Visit Date: 04/03/2019  Today's Provider: [name], MD   Chief Complaint  Patient presents with  . Shortness of Breath    1 month follow up   . Fatigue   Subjective:     HPI  Patient comes in today for a 1 month follow up. She was last seen in the office for a ER follow up on May 6. She reports that she is still feeling weak and very tired. She reports that her cramps has improved. She reports that she still experiences shortness of breath with exertion and remains very fatigued. She is taking all of her medications consistently. Labs from last visit: Results for orders placed or performed in visit on 03/13/19  VITAMIN D 25 Hydroxy (Vit-D Deficiency, Fractures)  Result Value Ref Range   Vit D, 25-Hydroxy 83.2 30.0 - 100.0 ng/mL  TSH  Result Value Ref Range   TSH 1.890 0.450 - 4.500 uIU/mL  T4, free  Result Value Ref Range   Free T4 1.43 0.82 - 1.77 ng/dL  Brain natriuretic peptide  Result Value Ref Range   BNP 17.8 0.0 - 100.0 pg/mL  Magnesium  Result Value Ref Range   Magnesium 1.6 1.6 - 2.3 mg/dL  POCT HgB Q1J  Result Value Ref Range   Hemoglobin A1C 7.3 (A) 4.0 - 5.6 %   HbA1c POC (<> result, manual entry)     HbA1c, POC (prediabetic range)     HbA1c, POC (controlled diabetic range)     Est. average glucose Bld gHb Est-mCnc 163      She is compliant with taking her iron and vitamin D supplements. She reports that she received a B12 injection in the office during her last visit, and it did give her a boost of energy.   BP Readings from Last 3 Encounters:  04/03/19 108/62  03/13/19 100/60  03/07/19 121/64   Wt Readings from Last 3 Encounters:  04/03/19 131 lb (59.4 kg)  03/13/19 135 lb (61.2 kg)  03/07/19 128 lb (58.1 kg)   Lab Results  Component Value Date   WBC 3.4 (L) 03/07/2019   HGB 11.2 (L) 03/07/2019   HCT 35.3 (L) 03/07/2019   MCV 92.9 03/07/2019   PLT 101  (L) 03/07/2019     Allergies  Allergen Reactions  . Other Other (See Comments)    NO BLOOD PRODUCTS- JEHOVAHS WITNESS  . Sulfa Antibiotics Other (See Comments)    Loss of taste     Current Outpatient Medications:  .  acetaminophen (TYLENOL) 500 MG tablet, Take 1,000 mg by mouth 2 (two) times daily as needed for mild pain or moderate pain., Disp: , Rfl:  .  Alcohol Swabs (B-D SINGLE USE SWABS REGULAR) PADS, USE TO CHECK BLOOD SUGAR DAILY, Disp: 100 each, Rfl: 4 .  aspirin EC 81 MG tablet, Take 81 mg by mouth daily., Disp: , Rfl:  .  atorvastatin (LIPITOR) 20 MG tablet, TAKE 1 TABLET EVERY DAY, Disp: 90 tablet, Rfl: 4 .  carvedilol (COREG) 6.25 MG tablet, Take 1/2 tablet (3.125 mg) by mouth TWICE daily, Disp: , Rfl:  .  ferrous sulfate 325 (65 FE) MG tablet, Take 325 mg by mouth 2 (two) times daily with a meal., Disp: , Rfl:  .  furosemide (LASIX) 40 MG tablet, Take 20 mg by mouth daily. , Disp: , Rfl:  .  glipiZIDE (  GLIPIZIDE XL) 2.5 MG 24 hr tablet, Take 1 tablet (2.5 mg total) by mouth daily with breakfast., Disp: 90 tablet, Rfl: 1 .  JANUMET XR [phone] MG TB24, TAKE 1 TABLET DAILY. REPLACES JANUVIA AND METFORMIN, Disp: 90 tablet, Rfl: 4 .  levothyroxine (SYNTHROID) 50 MCG tablet, Take 1 tablet (50 mcg total) by mouth daily., Disp: 30 tablet, Rfl: 1 .  pantoprazole (PROTONIX) 40 MG tablet, Take 1 tablet (40 mg total) by mouth daily., Disp: 90 tablet, Rfl: 3 .  sacubitril-valsartan (ENTRESTO) 24-26 MG, Take 0.5 tablets by mouth 2 (two) times daily., Disp: , Rfl:  .  senna (SENOKOT) 8.6 MG TABS tablet, Take 1 tablet by mouth daily as needed for mild constipation., Disp: , Rfl:  .  TRUE METRIX BLOOD GLUCOSE TEST test strip, CHECK BLOOD SUGAR EVERY DAY, Disp: 100 each, Rfl: 3 .  TRUEPLUS LANCETS 33G MISC, CHECK BLOOD SUGAR ONE TIME A DAY, Disp: 100 each, Rfl: 4 .  Vitamin D, Ergocalciferol, (DRISDOL) 1.25 MG (50000 UT) CAPS capsule, Take 1 capsule (50,000 Units total) by mouth every 7  (seven) days., Disp: 12 capsule, Rfl: 3  Review of Systems  Constitutional: Positive for activity change and fatigue. Negative for unexpected weight change.  Respiratory: Positive for shortness of breath. Negative for apnea, cough and wheezing.   Cardiovascular: Negative for chest pain, palpitations and leg swelling.  Endocrine: Negative for cold intolerance, heat intolerance, polydipsia, polyphagia and polyuria.  Musculoskeletal: Negative for arthralgias, back pain and myalgias.  Allergic/Immunologic: Negative for environmental allergies.  Neurological: Positive for weakness and light-headedness. Negative for dizziness, tremors, syncope and headaches.  Psychiatric/Behavioral: Negative for agitation, confusion, self-injury, sleep disturbance and suicidal ideas. The patient is not nervous/anxious.     Social History   Tobacco Use  . Smoking status: Never Smoker  . Smokeless tobacco: Former NeurosurgeonUser    Types: Snuff  Substance Use Topics  . Alcohol use: No    Alcohol/week: 0.0 standard drinks      Objective:   BP 108/62 (BP Location: Left Arm, Patient Position: Sitting, Cuff Size: Normal)   Pulse 72   Temp 97.9 F (36.6 C)   Resp 14   Ht 5\' 1"  (1.549 m)   Wt 131 lb (59.4 kg)   LMP  (LMP Unknown)   SpO2 100%   BMI 24.75 kg/m  Vitals:   04/03/19 1108  BP: 108/62  Pulse: 72  Resp: 14  Temp: 97.9 F (36.6 C)  SpO2: 100%  Weight: 131 lb (59.4 kg)  Height: 5\' 1"  (1.549 m)     Physical Exam   General Appearance:    Alert, cooperative, no distress  Eyes:    PERRL, conjunctiva/corneas clear, EOM's intact       Lungs:     Clear to auscultation bilaterally, respirations unlabored  Heart:    Regular rate and rhythm. No gallops. No edema  Neurologic:   Awake, alert, oriented x 3. No apparent focal neurological           defect.          Assessment & Plan    1. Other fatigue Likely sequela of CHF, but she is hypotensive likely exacerbated by medications. Will reduce-  carvedilol (COREG) to 3.125 MG tablet; Take 1/2 tablet by mouth TWICE daily  Dispense: 45 tablet; Refill: 1 Given second b12 injection today  2. Chronic systolic heart failure (HCC) Well controlled, reduce- carvedilol (COREG) 3.125 MG tablet; Take 1/2 tablet by mouth TWICE daily  Dispense: 45 tablet;  Refill: 1 as above  3. Type 2 diabetes mellitus without complication, without long-term current use of insulin (HCC) Well controlled.  Continue current medications.    Follow up in 3 months with CPE.     The entirety of the information documented in the History of Present Illness, Review of Systems and Physical Exam were personally obtained by me. Portions of this information were initially documented by Anson Oregon, CMA and reviewed by me for thoroughness and accuracy.    [name], MD  [facility]

## 2019-04-04 ENCOUNTER — Telehealth: Payer: Self-pay

## 2019-04-04 NOTE — Telephone Encounter (Signed)
Left message for patient to remind of missed remote transmission.  

## 2019-04-08 ENCOUNTER — Ambulatory Visit (INDEPENDENT_AMBULATORY_CARE_PROVIDER_SITE_OTHER): Payer: Medicare PPO | Admitting: *Deleted

## 2019-04-08 DIAGNOSIS — I428 Other cardiomyopathies: Secondary | ICD-10-CM | POA: Diagnosis not present

## 2019-04-08 DIAGNOSIS — I5022 Chronic systolic (congestive) heart failure: Secondary | ICD-10-CM

## 2019-04-09 ENCOUNTER — Ambulatory Visit (INDEPENDENT_AMBULATORY_CARE_PROVIDER_SITE_OTHER): Payer: Medicare PPO

## 2019-04-09 ENCOUNTER — Other Ambulatory Visit: Payer: Self-pay

## 2019-04-09 ENCOUNTER — Encounter: Payer: Medicare PPO | Admitting: Family Medicine

## 2019-04-09 DIAGNOSIS — Z0000 Encounter for general adult medical examination without abnormal findings: Secondary | ICD-10-CM | POA: Diagnosis not present

## 2019-04-09 LAB — CUP PACEART REMOTE DEVICE CHECK
Battery Remaining Longevity: 25 mo
Battery Voltage: 2.92 V
Brady Statistic AP VP Percent: 0.02 %
Brady Statistic AP VS Percent: 0.01 %
Brady Statistic AS VP Percent: 98.71 %
Brady Statistic AS VS Percent: 1.25 %
Brady Statistic RA Percent Paced: 0.03 %
Brady Statistic RV Percent Paced: 6.49 %
Date Time Interrogation Session: 20200601185055
HighPow Impedance: 56 Ohm
Implantable Lead Implant Date: 20160409
Implantable Lead Implant Date: 20160409
Implantable Lead Implant Date: 20160409
Implantable Lead Location: 753858
Implantable Lead Location: 753859
Implantable Lead Location: 753860
Implantable Lead Model: 4598
Implantable Lead Model: 5076
Implantable Pulse Generator Implant Date: 20160409
Lead Channel Impedance Value: 1083 Ohm
Lead Channel Impedance Value: 1121 Ohm
Lead Channel Impedance Value: 1121 Ohm
Lead Channel Impedance Value: 1197 Ohm
Lead Channel Impedance Value: 1254 Ohm
Lead Channel Impedance Value: 342 Ohm
Lead Channel Impedance Value: 399 Ohm
Lead Channel Impedance Value: 456 Ohm
Lead Channel Impedance Value: 456 Ohm
Lead Channel Impedance Value: 589 Ohm
Lead Channel Impedance Value: 722 Ohm
Lead Channel Impedance Value: 760 Ohm
Lead Channel Impedance Value: 931 Ohm
Lead Channel Pacing Threshold Amplitude: 0.375 V
Lead Channel Pacing Threshold Amplitude: 0.75 V
Lead Channel Pacing Threshold Amplitude: 2.125 V
Lead Channel Pacing Threshold Pulse Width: 0.4 ms
Lead Channel Pacing Threshold Pulse Width: 0.4 ms
Lead Channel Pacing Threshold Pulse Width: 0.8 ms
Lead Channel Sensing Intrinsic Amplitude: 0.875 mV
Lead Channel Sensing Intrinsic Amplitude: 0.875 mV
Lead Channel Sensing Intrinsic Amplitude: 10.375 mV
Lead Channel Sensing Intrinsic Amplitude: 10.375 mV
Lead Channel Setting Pacing Amplitude: 1.5 V
Lead Channel Setting Pacing Amplitude: 2 V
Lead Channel Setting Pacing Amplitude: 2.75 V
Lead Channel Setting Pacing Pulse Width: 0.4 ms
Lead Channel Setting Pacing Pulse Width: 0.8 ms
Lead Channel Setting Sensing Sensitivity: 0.3 mV

## 2019-04-09 NOTE — Patient Instructions (Addendum)
[name] , Thank you for taking time to come for your Medicare Wellness Visit. I appreciate your ongoing commitment to your health goals. Please review the following plan we discussed and let me know if I can assist you in the future.   Screening recommendations/referrals: Colonoscopy: Up to date, due 05/2023 Mammogram: Up to date, due 06/2019 Bone Density: Up to date, due 06/2019 Recommended yearly ophthalmology/optometry visit for glaucoma screening and checkup Recommended yearly dental visit for hygiene and checkup  Vaccinations: Influenza vaccine: Up to date Pneumococcal vaccine: Completed series Tdap vaccine: Pt declines today.  Shingles vaccine: Pt declines today.     Advanced directives: Currently on file.   Conditions/risks identified: Fall risk preventatives discussed with you today.   Next appointment: 06/11/19 @ 10:00 AM with [name]. Declined scheduling an AWV for 2021 at this time.    Preventive Care 73 Years and Older, Female Preventive care refers to lifestyle choices and visits with your health care provider that can promote health and wellness. What does preventive care include?  A yearly physical exam. This is also called an annual well check.  Dental exams once or twice a year.  Routine eye exams. Ask your health care provider how often you should have your eyes checked.  Personal lifestyle choices, including:  Daily care of your teeth and gums.  Regular physical activity.  Eating a healthy diet.  Avoiding tobacco and drug use.  Limiting alcohol use.  Practicing safe sex.  Taking low-dose aspirin every day.  Taking vitamin and mineral supplements as recommended by your health care provider. What happens during an annual well check? The services and screenings done by your health care provider during your annual well check will depend on your age, overall health, lifestyle risk factors, and family history of disease. Counseling  Your health  care provider may ask you questions about your:  Alcohol use.  Tobacco use.  Drug use.  Emotional well-being.  Home and relationship well-being.  Sexual activity.  Eating habits.  History of falls.  Memory and ability to understand (cognition).  Work and work Astronomer.  Reproductive health. Screening  You may have the following tests or measurements:  Height, weight, and BMI.  Blood pressure.  Lipid and cholesterol levels. These may be checked every 5 years, or more frequently if you are over 24 years old.  Skin check.  Lung cancer screening. You may have this screening every year starting at age 73 if you have a 30-pack-year history of smoking and currently smoke or have quit within the past 15 years.  Fecal occult blood test (FOBT) of the stool. You may have this test every year starting at age 73.  Flexible sigmoidoscopy or colonoscopy. You may have a sigmoidoscopy every 5 years or a colonoscopy every 10 years starting at age 66.  Hepatitis C blood test.  Hepatitis B blood test.  Sexually transmitted disease (STD) testing.  Diabetes screening. This is done by checking your blood sugar (glucose) after you have not eaten for a while (fasting). You may have this done every 1-3 years.  Bone density scan. This is done to screen for osteoporosis. You may have this done starting at age 38.  Mammogram. This may be done every 1-2 years. Talk to your health care provider about how often you should have regular mammograms. Talk with your health care provider about your test results, treatment options, and if necessary, the need for more tests. Vaccines  Your health care provider may recommend  certain vaccines, such as:  Influenza vaccine. This is recommended every year.  Tetanus, diphtheria, and acellular pertussis (Tdap, Td) vaccine. You may need a Td booster every 10 years.  Zoster vaccine. You may need this after age 52.  Pneumococcal 13-valent conjugate  (PCV13) vaccine. One dose is recommended after age 56.  Pneumococcal polysaccharide (PPSV23) vaccine. One dose is recommended after age 34. Talk to your health care provider about which screenings and vaccines you need and how often you need them. This information is not intended to replace advice given to you by your health care provider. Make sure you discuss any questions you have with your health care provider. Document Released: 11/20/2015 Document Revised: 07/13/2016 Document Reviewed: 08/25/2015 Elsevier Interactive Patient Education  2017 [employer] Prevention in the Home Falls can cause injuries. They can happen to people of all ages. There are many things you can do to make your home safe and to help prevent falls. What can I do on the outside of my home?  Regularly fix the edges of walkways and driveways and fix any cracks.  Remove anything that might make you trip as you walk through a door, such as a raised step or threshold.  Trim any bushes or trees on the path to your home.  Use bright outdoor lighting.  Clear any walking paths of anything that might make someone trip, such as rocks or tools.  Regularly check to see if handrails are loose or broken. Make sure that both sides of any steps have handrails.  Any raised decks and porches should have guardrails on the edges.  Have any leaves, snow, or ice cleared regularly.  Use sand or salt on walking paths during winter.  Clean up any spills in your garage right away. This includes oil or grease spills. What can I do in the bathroom?  Use night lights.  Install grab bars by the toilet and in the tub and shower. Do not use towel bars as grab bars.  Use non-skid mats or decals in the tub or shower.  If you need to sit down in the shower, use a plastic, non-slip stool.  Keep the floor dry. Clean up any water that spills on the floor as soon as it happens.  Remove soap buildup in the tub or shower  regularly.  Attach bath mats securely with double-sided non-slip rug tape.  Do not have throw rugs and other things on the floor that can make you trip. What can I do in the bedroom?  Use night lights.  Make sure that you have a light by your bed that is easy to reach.  Do not use any sheets or blankets that are too big for your bed. They should not hang down onto the floor.  Have a firm chair that has side arms. You can use this for support while you get dressed.  Do not have throw rugs and other things on the floor that can make you trip. What can I do in the kitchen?  Clean up any spills right away.  Avoid walking on wet floors.  Keep items that you use a lot in easy-to-reach places.  If you need to reach something above you, use a strong step stool that has a grab bar.  Keep electrical cords out of the way.  Do not use floor polish or wax that makes floors slippery. If you must use wax, use non-skid floor wax.  Do not have throw rugs and other  things on the floor that can make you trip. What can I do with my stairs?  Do not leave any items on the stairs.  Make sure that there are handrails on both sides of the stairs and use them. Fix handrails that are broken or loose. Make sure that handrails are as long as the stairways.  Check any carpeting to make sure that it is firmly attached to the stairs. Fix any carpet that is loose or worn.  Avoid having throw rugs at the top or bottom of the stairs. If you do have throw rugs, attach them to the floor with carpet tape.  Make sure that you have a light switch at the top of the stairs and the bottom of the stairs. If you do not have them, ask someone to add them for you. What else can I do to help prevent falls?  Wear shoes that:  Do not have high heels.  Have rubber bottoms.  Are comfortable and fit you well.  Are closed at the toe. Do not wear sandals.  If you use a stepladder:  Make sure that it is fully  opened. Do not climb a closed stepladder.  Make sure that both sides of the stepladder are locked into place.  Ask someone to hold it for you, if possible.  Clearly mark and make sure that you can see:  Any grab bars or handrails.  First and last steps.  Where the edge of each step is.  Use tools that help you move around (mobility aids) if they are needed. These include:  Canes.  Walkers.  Scooters.  Crutches.  Turn on the lights when you go into a dark area. Replace any light bulbs as soon as they burn out.  Set up your furniture so you have a clear path. Avoid moving your furniture around.  If any of your floors are uneven, fix them.  If there are any pets around you, be aware of where they are.  Review your medicines with your doctor. Some medicines can make you feel dizzy. This can increase your chance of falling. Ask your doctor what other things that you can do to help prevent falls. This information is not intended to replace advice given to you by your health care provider. Make sure you discuss any questions you have with your health care provider. Document Released: 08/20/2009 Document Revised: 03/31/2016 Document Reviewed: 11/28/2014 Elsevier Interactive Patient Education  2017 [employer].

## 2019-04-09 NOTE — Progress Notes (Signed)
Subjective:   Bethany [name] is a 73 y.o. female who presents for Medicare Annual (Subsequent) preventive examination.    This visit is being conducted through telemedicine due to the COVID-19 pandemic. This patient has given me verbal consent via doximity to conduct this visit, patient states they are participating from their home address. Some vital signs may be absent or patient reported.    Patient identification: identified by name, DOB, and current address  Review of Systems:  [name]  Cardiac Risk Factors include: advanced age (>13mn, >>57women);diabetes mellitus;hypertension     Objective:     Vitals: LMP  (LMP Unknown)   There is no height or weight on file to calculate BMI. Unable to obtain vitals due to visit being conducted via telephonically.   Advanced Directives 04/09/2019 03/07/2019 09/01/2018 08/23/2018 04/04/2018 09/14/2017 07/12/2017  Does Patient Have a Medical Advance Directive? Yes No No No Yes No Yes  Type of [place]aramed[place]of [place] will - - - [place] Does patient want to make changes to medical advance directive? - - - - - - -  Copy of [place] Chart? Yes - validated most recent copy scanned in chart (See row information) - - - Yes - -  Would patient like information on creating a medical advance directive? - No - Patient declined No - Patient declined No - Patient declined - No - Patient declined -    Tobacco Social History   Tobacco Use  Smoking Status Never Smoker  Smokeless Tobacco Former USystems developer . Types: Snuff     Counseling given: Not Answered   Clinical Intake:  Pre-visit preparation completed: Yes  Pain : No/denies pain Pain Score: 0-No pain     Nutritional Status: BMI of 19-24  Normal Nutritional Risks: None Diabetes: Yes  How often do you need to have someone help you when you read instructions, pamphlets, or other written  materials from your doctor or pharmacy?: 1 - Never   Diabetes:  Is the patient diabetic?  Yes type 2 If diabetic, was a CBG obtained today?  No  Did the patient bring in their glucometer from home?  No  How often do you monitor your CBG's? Once a day in AM.   Financial Strains and Diabetes Management:  Are you having any financial strains with the device, your supplies or your medication? No .  Does the patient want to be seen by Chronic Care Management for management of their diabetes?  No  Would the patient like to be referred to a Nutritionist or for Diabetic Management?  No   Diabetic Exams:  Diabetic Eye Exam: Completed 09/05/16. Overdue for diabetic eye exam. Pt has been advised about the importance in completing this exam and plan to schedule an exam this year.   Diabetic Foot Exam: Completed 03/15/16. Pt has been advised about the importance in completing this exam. Note made to f/u on this at next in office visit.     Interpreter Needed?: No  Information [facility] :: [facility]  Past Medical History:  Diagnosis Date  . AICD (automatic cardioverter/defibrillator) present   . Anemia   . Anxiety   . CHF (congestive heart failure) ([place]   . Complication of anesthesia   . Depression   . Diabetes ([place]   . Dilated cardiomyopathy ([place]   . Diverticulosis   . Dyspnea   . Dysrhythmia   .  Edema    FEET/ANKLES OCCAS  . GERD (gastroesophageal reflux disease)   . Hyperlipidemia   . Hypothyroidism   . LV dysfunction   . Orthopnea    USES 3 PILLOWS  . PONV (postoperative nausea and vomiting)   . Reflux   . Thrombocytopenia ([place])   . Thyroid disease   . VHD (valvular heart disease)    Past Surgical History:  Procedure Laterality Date  . ABDOMINAL HYSTERECTOMY    . BI-VENTRICULAR IMPLANTABLE CARDIOVERTER DEFIBRILLATOR [name] 02/11/2015   Procedure: BI-VENTRICULAR IMPLANTABLE CARDIOVERTER DEFIBRILLATOR  (CRT-D);  Surgeon: Deboraha Sprang, MD;  Location: [facility] CATH LAB;   Service: Cardiovascular;  Laterality: [name];  . BONE MARROW BIOPSY  2007   spine  . CARDIAC CATHETERIZATION    . CATARACT EXTRACTION W/PHACO Right 01/03/2017   Procedure: CATARACT EXTRACTION PHACO AND INTRAOCULAR LENS PLACEMENT (IOC);  Surgeon: Leandrew Koyanagi, MD;  Location: [facility] ORS;  Service: Ophthalmology;  Laterality: Right;  [place] 00:50 AP% 19.5 CDE 9.94 fluid pack lot # [id number]  . CATARACT EXTRACTION W/PHACO Left 10/27/2016   Procedure: CATARACT EXTRACTION PHACO AND INTRAOCULAR LENS PLACEMENT (IOC);  Surgeon: Leandrew Koyanagi, MD;  Location: [facility] ORS;  Service: Ophthalmology;  Laterality: Left;  [place]  01:12 AP%  21.0 CDE  11.59 Fluid pack lot # [id number]  . CHOLECYSTECTOMY    . INSERT / REPLACE / REMOVE PACEMAKER     AICD  . LEAD REVISION [name] 02/11/2015   Procedure: LEAD REVISION;  Surgeon: Deboraha Sprang, MD;  Location: [facility] CATH LAB;  Service: Cardiovascular;  Laterality: [name];  . PARTIAL HYSTERECTOMY  1977  . TOOTH EXTRACTION  09/23/2015   Family History  Problem Relation Age of Onset  . Heart disease Mother   . Heart attack Mother   . Prostate cancer Father   . Ulcers Father   . Stroke Sister   . Heart attack Sister   . Diabetes Sister   . Heart attack Brother   . Stroke Brother   . Breast cancer Neg Hx    Social History   Socioeconomic History  . Marital status: Divorced    Spouse name: Not on file  . Number of children: 4  . Years of education: Not on file  . Highest education level: GED or equivalent  Occupational History  . Occupation: retired  Scientific laboratory technician  . Financial resource strain: Not hard at all  . Food insecurity:    Worry: Never true    Inability: Never true  . Transportation needs:    Medical: No    Non-medical: No  Tobacco Use  . Smoking status: Never Smoker  . Smokeless tobacco: Former Systems developer    Types: Snuff  Substance and Sexual Activity  . Alcohol use: No    Alcohol/week: 0.0 standard drinks  . Drug use: No  . Sexual activity: Not on  file  Lifestyle  . Physical activity:    Days per week: 0 days    Minutes per session: 0 min  . Stress: Not at all  Relationships  . Social connections:    Talks on phone: Patient refused    Gets together: Patient refused    Attends religious service: Patient refused    Active member of club or organization: Patient refused    Attends meetings of clubs or organizations: Patient refused    Relationship status: Patient refused  Other Topics Concern  . Not on file  Social History Narrative  . Not on file    Outpatient Encounter Medications  as of 04/09/2019  Medication Sig  . acetaminophen (TYLENOL) 500 MG tablet Take 1,000 mg by mouth 2 (two) times daily as needed for mild pain or moderate pain.  . Alcohol Swabs (B-D SINGLE USE SWABS REGULAR) PADS USE TO CHECK BLOOD SUGAR DAILY  . aspirin EC 81 MG tablet Take 81 mg by mouth daily.  Marland Kitchen atorvastatin (LIPITOR) 20 MG tablet TAKE 1 TABLET EVERY DAY  . carvedilol (COREG) 3.125 MG tablet Take 1/2 tablet by mouth TWICE daily  . ferrous sulfate 325 (65 FE) MG tablet Take 325 mg by mouth 2 (two) times daily with a meal.  . furosemide (LASIX) 40 MG tablet Take 20 mg by mouth daily.   Marland Kitchen glipiZIDE (GLIPIZIDE XL) 2.5 MG 24 hr tablet Take 1 tablet (2.5 mg total) by mouth daily with breakfast.  . JANUMET XR [id number] MG TB24 TAKE 1 TABLET DAILY. REPLACES JANUVIA AND METFORMIN  . levothyroxine (SYNTHROID) 50 MCG tablet Take 1 tablet (50 mcg total) by mouth daily.  . pantoprazole (PROTONIX) 40 MG tablet Take 1 tablet (40 mg total) by mouth daily.  . sacubitril-valsartan (ENTRESTO) 24-26 MG Take 0.5 tablets by mouth 2 (two) times daily.  Marland Kitchen senna (SENOKOT) 8.6 MG TABS tablet Take 1 tablet by mouth daily as needed for mild constipation.  . TRUE METRIX BLOOD GLUCOSE TEST test strip CHECK BLOOD SUGAR EVERY DAY  . TRUEPLUS LANCETS 33G MISC CHECK BLOOD SUGAR ONE TIME A DAY  . Vitamin D, Ergocalciferol, (DRISDOL) 1.25 MG (50000 UT) CAPS capsule Take 1 capsule  (50,000 Units total) by mouth every 7 (seven) days.   No facility-administered encounter medications on file as of 04/09/2019.     Activities of Daily Living In your present state of health, do you have any difficulty performing the following activities: 04/09/2019  Hearing? N  Vision? Y  Comment Occasionally with BS drops or increases. Pt to f/u with optometrist this year.   Difficulty concentrating or making decisions? N  Walking or climbing stairs? N  Dressing or bathing? N  Doing errands, shopping? N  Preparing Food and eating ? N  Using the Toilet? N  In the past six months, have you accidently leaked urine? Y  Comment Occasionally- wears protection as a back up.  Do you have problems with loss of bowel control? N  Managing your Medications? N  Managing your Finances? N  Housekeeping or managing your Housekeeping? N  Some recent data might be hidden    Patient Care Team: Birdie Sons, MD as PCP - General (Family Medicine) Corey Skains, MD as Consulting Physician (Cardiology) Deboraha Sprang, MD as Consulting Physician (Cardiology) Leandrew Koyanagi, MD as Referring Physician (Ophthalmology)    Assessment:   This is a routine wellness examination for Bethany [name].  Exercise Activities and Dietary recommendations Current Exercise Habits: The patient does not participate in regular exercise at present, Exercise limited by: orthopedic condition(s)  Goals    . DIET - EAT MORE FRUITS AND VEGETABLES     Recommend increasing fruits and vegetables in daily diet to 2-3 servings each a day.     . Eat more fruits and vegetables     Recommend increasing amount of vegetables in daily diet to at least two serving a day.    Marland Kitchen HEMOGLOBIN A1C < 7.0    . Peak Blood Glucose < 180    . Prevent falls     Recommend to remove any items from the home that may cause slips or trips.  Fall Risk: Fall Risk  04/09/2019 04/04/2018 01/09/2018 07/12/2017 03/23/2017  Falls in the past year? 1  No No No No  Number falls in past yr: 0 - - - -  Injury with Fall? 1 - - - -  Comment broke right foot - - - -  Follow up Falls prevention discussed - - - -    FALL RISK PREVENTION PERTAINING TO THE HOME:  Any stairs in or around the home? No  If so, are there any without handrails? [name]  Home free of loose throw rugs in walkways, pet beds, electrical cords, etc? Yes  Adequate lighting in your home to reduce risk of falls? Yes   ASSISTIVE DEVICES UTILIZED TO PREVENT FALLS:  Life alert? No  Use of a cane, walker or w/c? No  Grab bars in the bathroom? Yes  Shower chair or bench in shower? Yes  Elevated toilet seat or a handicapped toilet? Yes   TIMED UP AND GO:  Was the test performed? No .    Depression Screen PHQ 2/9 Scores 04/09/2019 04/09/2019 04/04/2018 01/09/2018  PHQ - 2 Score 0 0 0 0  PHQ- 9 Score 3 - - -     Cognitive Function: Declined today.      6CIT Screen 04/04/2018 03/23/2017  What Year? 0 points 0 points  What month? 0 points 0 points  What time? 0 points 0 points  Count back from 20 0 points 0 points  Months in reverse 0 points 0 points  Repeat phrase 6 points 6 points  Total Score 6 6    Immunization History  Administered Date(s) Administered  . Influenza, High Dose Seasonal PF 07/07/2016, 10/09/2017, 08/01/2018  . Influenza,inj,Quad PF,6+ Mos 08/14/2015  . Pneumococcal Conjugate-13 09/18/2014  . Pneumococcal Polysaccharide-23 09/11/2015    Qualifies for Shingles Vaccine? Yes . Due for Shingrix. Education has been provided regarding the importance of this vaccine. Pt has been advised to call insurance company to determine out of pocket expense. Advised may also receive vaccine at local pharmacy or Health Dept. Verbalized acceptance and understanding.  Tdap: Although this vaccine is not a covered service during a Wellness Exam, does the patient still wish to receive this vaccine today?  No . Advised may receive this vaccine at local pharmacy or Health  Dept. Aware to provide a copy of the vaccination record if obtained from local pharmacy or Health Dept. Verbalized acceptance and understanding.  Flu Vaccine: Up to date  Pneumococcal Vaccine: Up to date  Screening Tests Health Maintenance  Topic Date Due  . FOOT EXAM  03/15/2017  . OPHTHALMOLOGY EXAM  09/05/2017  . TETANUS/TDAP  11/07/2026 (Originally 05/08/1965)  . INFLUENZA VACCINE  06/08/2019  . MAMMOGRAM  07/05/2019  . DEXA SCAN  07/05/2019  . HEMOGLOBIN A1C  09/13/2019  . URINE MICROALBUMIN  12/05/2019  . COLONOSCOPY  05/08/2023  . Hepatitis C Screening  Completed  . PNA vac Low Risk Adult  Completed    Cancer Screenings:  Colorectal Screening: Completed 05/07/13. Repeat every 10 years.  Mammogram: Completed 07/04/17.   Bone Density: Completed 07/04/17. Results reflect OSTEOPOROSIS. Repeat every 2 years.   Lung Cancer Screening: (Low Dose CT Chest recommended if Age 33-80 years, 30 pack-year currently smoking OR have quit w/in 15years.) does not qualify.   Additional Screening:  Hepatitis C Screening: Up to date  Dental Screening: Recommended annual dental exams for proper oral hygiene   Community Resource Referral:  CRR required this visit?  No  Plan:  I have personally reviewed and addressed the Medicare Annual Wellness questionnaire and have noted the following in the patient's chart:  A. Medical and social history B. Use of alcohol, tobacco or illicit drugs  C. Current medications and supplements D. Functional ability and status E.  Nutritional status F.  Physical activity G. Advance directives H. List of other physicians I.  Hospitalizations, surgeries, and ER visits in previous 12 months J.  [place] such as hearing and vision if needed, cognitive and depression L. Referrals and appointments   In addition, I have reviewed and discussed with patient certain preventive protocols, quality metrics, and best practice recommendations. A  written personalized care plan for preventive services as well as general preventive health recommendations were provided to patient.   Glendora Score, [place]  05/07/5805 Nurse Health Advisor   Nurse Notes: Pt needs a diabetic foot exam at next in office visit. Pt plans to set up an eye exam this year.

## 2019-04-16 ENCOUNTER — Encounter: Payer: Self-pay | Admitting: Cardiology

## 2019-04-16 NOTE — Progress Notes (Signed)
Remote ICD transmission.   

## 2019-04-25 ENCOUNTER — Telehealth: Payer: Self-pay | Admitting: Family Medicine

## 2019-04-25 DIAGNOSIS — E559 Vitamin D deficiency, unspecified: Secondary | ICD-10-CM

## 2019-04-25 MED ORDER — VITAMIN D (ERGOCALCIFEROL) 1.25 MG (50000 UNIT) PO CAPS: 50000.0000 [IU] | ORAL_CAPSULE | ORAL | 3 refills | Status: DC

## 2019-04-25 NOTE — Telephone Encounter (Signed)
Pt needs refill on  Vit D 1.25 mg  [place]  Thanks Con Memos

## 2019-05-27 ENCOUNTER — Other Ambulatory Visit: Payer: Self-pay | Admitting: Family Medicine

## 2019-05-27 DIAGNOSIS — R5383 Other fatigue: Secondary | ICD-10-CM

## 2019-05-27 DIAGNOSIS — I5022 Chronic systolic (congestive) heart failure: Secondary | ICD-10-CM

## 2019-05-28 DIAGNOSIS — E119 Type 2 diabetes mellitus without complications: Secondary | ICD-10-CM | POA: Diagnosis not present

## 2019-05-28 DIAGNOSIS — H35073 Retinal telangiectasis, bilateral: Secondary | ICD-10-CM | POA: Diagnosis not present

## 2019-05-28 LAB — HM DIABETES EYE EXAM

## 2019-05-30 ENCOUNTER — Encounter: Payer: Self-pay | Admitting: Podiatry

## 2019-05-30 ENCOUNTER — Other Ambulatory Visit: Payer: Self-pay

## 2019-05-30 ENCOUNTER — Encounter: Payer: Self-pay | Admitting: Family Medicine

## 2019-05-30 ENCOUNTER — Ambulatory Visit: Payer: Medicare PPO | Admitting: Podiatry

## 2019-05-30 DIAGNOSIS — L600 Ingrowing nail: Secondary | ICD-10-CM

## 2019-05-30 DIAGNOSIS — B351 Tinea unguium: Secondary | ICD-10-CM

## 2019-05-30 DIAGNOSIS — E119 Type 2 diabetes mellitus without complications: Secondary | ICD-10-CM

## 2019-05-30 DIAGNOSIS — M79674 Pain in right toe(s): Secondary | ICD-10-CM | POA: Diagnosis not present

## 2019-05-30 NOTE — Progress Notes (Signed)
This patient presents the office with chief complaint of pain on the inside border big toe right foot.  She says this is been painful for the last month especially wearing shoes.  She was seen in this office by Dr. Amalia Hailey 16 months ago.  Patient states that she has provided no self treatment for this condition.  She presents the office for an evaluation and treatment of this painful thick ingrowing nail.  Patient is diabetic.  Vascular  Dorsalis pedis  are palpable  B/L. Posterior tibial pulses are absent. Capillary return  WNL.  Temperature gradient is  WNL.  Skin turgor  WNL  Sensorium  Senn Weinstein monofilament wire  WNL. Normal tactile sensation.  Nail Exam  Patient has normal nails with no evidence of bacterial or fungal infection. Her right hallux nail is thickened and ingrown medial border right hallux.  Orthopedic  Exam  Muscle tone and muscle strength  WNL.  No limitations of motion feet  B/L.  No crepitus or joint effusion noted.  Foot type is unremarkable and digits show no abnormalities.  Bony prominences are unremarkable. Pes planus  TNJ DJD  B/L.  DJD 1st MPJ  B/L.  Skin  No open lesions.  Normal skin texture and turgor.  Ingrown Nail  Onychomycosis right hallux nail.  Debride nail  RTC prn   Gardiner Barefoot DPM

## 2019-06-10 ENCOUNTER — Telehealth: Payer: Self-pay | Admitting: Family Medicine

## 2019-06-10 NOTE — Telephone Encounter (Signed)
Pt was pre-screened for tomorrow's appt.  Pt is having the following:  Sweats Cough Congestion in head Shortness of breath Diarrhea Scratchy Throat Ear fullness  Pt says she has all this on a reg basis. Pt wanting to come in if possible for CPE.    Please advise.  Thanks, [employer]

## 2019-06-11 ENCOUNTER — Ambulatory Visit (INDEPENDENT_AMBULATORY_CARE_PROVIDER_SITE_OTHER): Payer: Medicare PPO | Admitting: Family Medicine

## 2019-06-11 ENCOUNTER — Encounter: Payer: Self-pay | Admitting: Family Medicine

## 2019-06-11 ENCOUNTER — Other Ambulatory Visit: Payer: Self-pay

## 2019-06-11 VITALS — BP 97/63 | HR 79 | Temp 98.2°F | Ht 61.0 in | Wt 131.0 lb

## 2019-06-11 DIAGNOSIS — D708 Other neutropenia: Secondary | ICD-10-CM

## 2019-06-11 DIAGNOSIS — Z0000 Encounter for general adult medical examination without abnormal findings: Secondary | ICD-10-CM

## 2019-06-11 DIAGNOSIS — I471 Supraventricular tachycardia, unspecified: Secondary | ICD-10-CM

## 2019-06-11 DIAGNOSIS — D696 Thrombocytopenia, unspecified: Secondary | ICD-10-CM

## 2019-06-11 NOTE — Progress Notes (Signed)
Patient: Bethany [name], Female    DOB: 1946/04/21, 73 y.o.   MRN: [id number] Visit Date: 06/11/2019  Today's Provider: Lelon Huh, MD   Chief Complaint  Patient presents with  . Annual Exam   Subjective:     Complete Physical Bethany [name] is a 73 y.o. female. She feels fairly well. She reports exercising some. She reports she is sleeping well.  -----------------------------------------------------------   Review of Systems  Constitutional: Positive for diaphoresis and fatigue. Negative for activity change, appetite change, chills, fever and unexpected weight change.  HENT: Positive for dental problem and sneezing. Negative for congestion, drooling, ear discharge, ear pain, facial swelling, hearing loss, mouth sores, nosebleeds, postnasal drip, rhinorrhea, sinus pressure, sinus pain, sore throat, tinnitus, trouble swallowing and voice change.   Eyes: Negative.   Respiratory: Positive for chest tightness and shortness of breath. Negative for apnea, cough, choking, wheezing and stridor.   Cardiovascular: Positive for leg swelling. Negative for chest pain and palpitations.  Gastrointestinal: Positive for abdominal distention and diarrhea. Negative for abdominal pain, anal bleeding, blood in stool, constipation, nausea, rectal pain and vomiting.  Endocrine: Positive for polyuria. Negative for cold intolerance, heat intolerance, polydipsia and polyphagia.  Genitourinary: Positive for enuresis. Negative for decreased urine volume, difficulty urinating, dyspareunia, dysuria, flank pain, frequency, genital sores, hematuria, menstrual problem, pelvic pain, urgency, vaginal bleeding, vaginal discharge and vaginal pain.  Musculoskeletal: Positive for arthralgias, back pain and neck pain. Negative for gait problem, joint swelling, myalgias and neck stiffness.  Skin: Negative.   Allergic/Immunologic: Negative.   Neurological: Positive for weakness and light-headedness. Negative for  dizziness, tremors, seizures, syncope, facial asymmetry, speech difficulty, numbness and headaches.  Hematological: Negative.   Psychiatric/Behavioral: Positive for decreased concentration. Negative for agitation, behavioral problems, confusion, dysphoric mood, hallucinations, self-injury, sleep disturbance and suicidal ideas. The patient is not nervous/anxious and is not hyperactive.     Social History   Socioeconomic History  . Marital status: Divorced    Spouse name: Not on file  . Number of children: 4  . Years of education: Not on file  . Highest education level: GED or equivalent  Occupational History  . Occupation: retired  Scientific laboratory technician  . Financial resource strain: Not hard at all  . Food insecurity    Worry: Never true    Inability: Never true  . Transportation needs    Medical: No    Non-medical: No  Tobacco Use  . Smoking status: Never Smoker  . Smokeless tobacco: Former Systems developer    Types: Snuff  Substance and Sexual Activity  . Alcohol use: No    Alcohol/week: 0.0 standard drinks  . Drug use: No  . Sexual activity: Not on file  Lifestyle  . Physical activity    Days per week: 0 days    Minutes per session: 0 min  . Stress: Not at all  Relationships  . Social Herbalist on phone: Patient refused    Gets together: Patient refused    Attends religious service: Patient refused    Active member of club or organization: Patient refused    Attends meetings of clubs or organizations: Patient refused    Relationship status: Patient refused  . Intimate partner violence    Fear of current or ex partner: Patient refused    Emotionally abused: Patient refused    Physically abused: Patient refused    Forced sexual activity: Patient refused  Other Topics Concern  . Not on  file  Social History Narrative  . Not on file    Past Medical History:  Diagnosis Date  . AICD (automatic cardioverter/defibrillator) present   . Anemia   . Anxiety   . CHF (congestive  heart failure) ([place])   . Complication of anesthesia   . Depression   . Diabetes ([place])   . Dilated cardiomyopathy ([place])   . Diverticulosis   . Dyspnea   . Dysrhythmia   . Edema    FEET/ANKLES OCCAS  . GERD (gastroesophageal reflux disease)   . Hyperlipidemia   . Hypothyroidism   . LV dysfunction   . Orthopnea    USES 3 PILLOWS  . PONV (postoperative nausea and vomiting)   . Reflux   . Thrombocytopenia ([place])   . Thyroid disease   . VHD (valvular heart disease)      Patient Active Problem List   Diagnosis Date Noted  . Other neutropenia ([place]) 06/11/2019  . Thrombocytopenia ([place]) 06/11/2019  . Pain due to onychomycosis of toenail of right foot 05/30/2019  . Memory loss 07/12/2016  . GERD (gastroesophageal reflux disease) 08/24/2015  . Automatic implantable cardioverter-defibrillator in situ 07/29/2015  . Hypotension 06/18/2015  . Benign essential HTN 03/20/2015  . Absolute anemia 03/11/2015  . Abnormal liver enzymes 03/11/2015  . Decreased potassium in the blood 03/11/2015  . Hypercholesteremia 03/11/2015  . Adult hypothyroidism 03/11/2015  . Idiopathic insomnia 03/11/2015  . OP (osteoporosis) 03/11/2015  . Diabetes mellitus, type 2 ([place]) 03/11/2015  . Vitamin D deficiency 03/11/2015  . LBBB (left bundle branch block) 02/11/2015  . Paroxysmal supraventricular tachycardia ([place]) 11/25/2014  . Chronic systolic heart failure ([place]) 09/25/2014  . Heart valve disease 04/10/2014    Past Surgical History:  Procedure Laterality Date  . ABDOMINAL HYSTERECTOMY    . BI-VENTRICULAR IMPLANTABLE CARDIOVERTER DEFIBRILLATOR [name] 02/11/2015   Procedure: BI-VENTRICULAR IMPLANTABLE CARDIOVERTER DEFIBRILLATOR  (CRT-D);  Surgeon: Deboraha Sprang, MD;  Location: [facility] CATH LAB;  Service: Cardiovascular;  Laterality: [name];  . BONE MARROW BIOPSY  2007   spine  . CARDIAC CATHETERIZATION    . CATARACT EXTRACTION W/PHACO Right 01/03/2017   Procedure: CATARACT EXTRACTION PHACO AND INTRAOCULAR LENS  PLACEMENT (IOC);  Surgeon: Leandrew Koyanagi, MD;  Location: [facility] ORS;  Service: Ophthalmology;  Laterality: Right;  [place] 00:50 AP% 19.5 CDE 9.94 fluid pack lot # [id number]  . CATARACT EXTRACTION W/PHACO Left 10/27/2016   Procedure: CATARACT EXTRACTION PHACO AND INTRAOCULAR LENS PLACEMENT (IOC);  Surgeon: Leandrew Koyanagi, MD;  Location: [facility] ORS;  Service: Ophthalmology;  Laterality: Left;  [place]  01:12 AP%  21.0 CDE  11.59 Fluid pack lot # [id number]  . CHOLECYSTECTOMY    . INSERT / REPLACE / REMOVE PACEMAKER     AICD  . LEAD REVISION [name] 02/11/2015   Procedure: LEAD REVISION;  Surgeon: Deboraha Sprang, MD;  Location: [facility] CATH LAB;  Service: Cardiovascular;  Laterality: [name];  . PARTIAL HYSTERECTOMY  1977  . TOOTH EXTRACTION  09/23/2015    Her family history includes Diabetes in her sister; Heart attack in her brother, mother, and sister; Heart disease in her mother; Prostate cancer in her father; Stroke in her brother and sister; Ulcers in her father. There is no history of Breast cancer.   Current Outpatient Medications:  .  acetaminophen (TYLENOL) 500 MG tablet, Take 1,000 mg by mouth 2 (two) times daily as needed for mild pain or moderate pain., Disp: , Rfl:  .  Alcohol Swabs (B-D SINGLE USE SWABS REGULAR) PADS, USE TO CHECK  BLOOD SUGAR DAILY, Disp: 100 each, Rfl: 4 .  aspirin EC 81 MG tablet, Take 81 mg by mouth daily., Disp: , Rfl:  .  atorvastatin (LIPITOR) 20 MG tablet, TAKE 1 TABLET EVERY DAY, Disp: 90 tablet, Rfl: 4 .  carvedilol (COREG) 3.125 MG tablet, TAKE 1/2 TABLET BY MOUTH TWICE DAILY (DOSE CHANGE), Disp: 90 tablet, Rfl: 4 .  ferrous sulfate 325 (65 FE) MG tablet, Take 325 mg by mouth 2 (two) times daily with a meal., Disp: , Rfl:  .  furosemide (LASIX) 40 MG tablet, Take 20 mg by mouth daily. , Disp: , Rfl:  .  glipiZIDE (GLIPIZIDE XL) 2.5 MG 24 hr tablet, Take 1 tablet (2.5 mg total) by mouth daily with breakfast., Disp: 90 tablet, Rfl: 1 .  JANUMET XR [phone] MG TB24,  TAKE 1 TABLET DAILY. REPLACES JANUVIA AND METFORMIN, Disp: 90 tablet, Rfl: 4 .  levothyroxine (SYNTHROID) 50 MCG tablet, Take 1 tablet (50 mcg total) by mouth daily., Disp: 30 tablet, Rfl: 1 .  pantoprazole (PROTONIX) 40 MG tablet, Take 1 tablet (40 mg total) by mouth daily., Disp: 90 tablet, Rfl: 3 .  sacubitril-valsartan (ENTRESTO) 24-26 MG, Take 0.5 tablets by mouth 2 (two) times daily., Disp: , Rfl:  .  senna (SENOKOT) 8.6 MG TABS tablet, Take 1 tablet by mouth daily as needed for mild constipation., Disp: , Rfl:  .  TRUE METRIX BLOOD GLUCOSE TEST test strip, CHECK BLOOD SUGAR EVERY DAY, Disp: 100 each, Rfl: 3 .  TRUEPLUS LANCETS 33G MISC, CHECK BLOOD SUGAR ONE TIME A DAY, Disp: 100 each, Rfl: 4 .  Vitamin D, Ergocalciferol, (DRISDOL) 1.25 MG (50000 UT) CAPS capsule, Take 1 capsule (50,000 Units total) by mouth every 7 (seven) days., Disp: 12 capsule, Rfl: 3  Patient Care Team: Birdie Sons, MD as PCP - General (Family Medicine) Corey Skains, MD as Consulting Physician (Cardiology) Deboraha Sprang, MD as Consulting Physician (Cardiology) Leandrew Koyanagi, MD as Referring Physician (Ophthalmology)     Objective:    Vitals: BP 97/63 (BP Location: Right Arm, Patient Position: Sitting, Cuff Size: Normal)   Pulse 79   Temp 98.2 F (36.8 C) (Oral)   Ht _0  (1.549 m)   Wt 131 lb (59.4 kg)   LMP  (LMP Unknown)   BMI 24.75 kg/m   Physical Exam   General Appearance:    Alert, cooperative, no distress, appears stated age  Head:    Normocephalic, without obvious abnormality, atraumatic  Eyes:    PERRL, conjunctiva/corneas clear, EOM's intact, fundi    benign, both eyes  Ears:    Normal TM's and external ear canals, both ears  Nose:   Nares normal, septum midline, mucosa normal, no drainage    or sinus tenderness  Throat:   Lips, mucosa, and tongue normal; teeth and gums normal  Neck:   Supple, symmetrical, trachea midline, no adenopathy;    thyroid:  no  enlargement/tenderness/nodules; no carotid   bruit or JVD  Back:     Symmetric, no curvature, ROM normal, no CVA tenderness  Lungs:     Clear to auscultation bilaterally, respirations unlabored  Chest Wall:    No tenderness or deformity   Heart:    Normal heart rate. Normal rhythm.  2/6 systolic murmur at right upper sternal border  Breast Exam:    normal appearance, no masses or tenderness  Abdomen:     Soft, non-tender, bowel sounds active all four quadrants,    no masses, no  organomegaly  Pelvic:    deferred  Extremities:   All extremities are intact. No cyanosis or edema  Pulses:   2+ and symmetric all extremities  Skin:   Skin color, texture, turgor normal, no rashes or lesions  Lymph nodes:   Cervical, supraclavicular, and axillary nodes normal  Neurologic:   CNII-XII intact, normal strength, sensation and reflexes    throughout   Activities of Daily Living In your present state of health, do you have any difficulty performing the following activities: 04/09/2019  Hearing? N  Vision? Y  Comment Occasionally with BS drops or increases. Pt to f/u with optometrist this year.   Difficulty concentrating or making decisions? N  Walking or climbing stairs? N  Dressing or bathing? N  Doing errands, shopping? N  Preparing Food and eating ? N  Using the Toilet? N  In the past six months, have you accidently leaked urine? Y  Comment Occasionally- wears protection as a back up.  Do you have problems with loss of bowel control? N  Managing your Medications? N  Managing your Finances? N  Housekeeping or managing your Housekeeping? N  Some recent data might be hidden    Fall Risk Assessment Fall Risk  04/09/2019 04/04/2018 01/09/2018 07/12/2017 03/23/2017  Falls in the past year? 1 No No No No  Number falls in past yr: 0 - - - -  Injury with Fall? 1 - - - -  Comment broke right foot - - - -  Follow up Falls prevention discussed - - - -     Depression Screen PHQ 2/9 Scores 04/09/2019  04/09/2019 04/04/2018 01/09/2018  PHQ - 2 Score 0 0 0 0  PHQ- 9 Score 3 - - -    6CIT Screen 04/04/2018  What Year? 0 points  What month? 0 points  What time? 0 points  Count back from 20 0 points  Months in reverse 0 points  Repeat phrase 6 points  Total Score 6       Assessment & Plan:    Annual Physical Reviewed patient's Family Medical History Reviewed and updated list of patient's medical providers Assessment of cognitive impairment was done Assessed patient's functional ability Established a written schedule for health screening [place] Completed and Reviewed  Exercise Activities and Dietary recommendations Goals    . DIET - EAT MORE FRUITS AND VEGETABLES     Recommend increasing fruits and vegetables in daily diet to 2-3 servings each a day.     . Eat more fruits and vegetables     Recommend increasing amount of vegetables in daily diet to at least two serving a day.    Marland Kitchen HEMOGLOBIN A1C < 7.0    . Peak Blood Glucose < 180    . Prevent falls     Recommend to remove any items from the home that may cause slips or trips.       Immunization History  Administered Date(s) Administered  . Influenza, High Dose Seasonal PF 07/07/2016, 10/09/2017, 08/01/2018  . Influenza,inj,Quad PF,6+ Mos 08/14/2015  . Pneumococcal Conjugate-13 09/18/2014  . Pneumococcal Polysaccharide-23 09/11/2015    Health Maintenance  Topic Date Due  . INFLUENZA VACCINE  06/08/2019  . TETANUS/TDAP  11/07/2026 (Originally 05/08/1965)  . MAMMOGRAM  07/05/2019  . DEXA SCAN  07/05/2019  . HEMOGLOBIN A1C  09/13/2019  . URINE MICROALBUMIN  12/05/2019  . OPHTHALMOLOGY EXAM  05/28/2020  . FOOT EXAM  05/29/2020  . COLONOSCOPY  05/08/2023  . Hepatitis  C Screening  Completed  . PNA vac Low Risk Adult  Completed     Discussed health benefits of physical activity, and encouraged her to engage in regular exercise appropriate for her age and condition.     ------------------------------------------------------------------------------------------------------------  1. Annual physical exam   2. Other neutropenia ([place]) Lab Results  Component Value Date   WBC 3.4 (L) 03/07/2019    3. Paroxysmal supraventricular tachycardia (H[facility]) Asymptomatic. Compliant with medication.  Continue aggressive risk factor modification.    4. Thrombocytopenia ([place]) Lab Results  Component Value Date   PLT 101 (L) 03/07/2019     Lelon Huh, MD  [facility]

## 2019-06-12 ENCOUNTER — Other Ambulatory Visit: Payer: Self-pay | Admitting: Family Medicine

## 2019-06-12 DIAGNOSIS — E119 Type 2 diabetes mellitus without complications: Secondary | ICD-10-CM

## 2019-06-12 MED ORDER — TRUEPLUS LANCETS 33G MISC: 4 refills | Status: DC

## 2019-06-24 NOTE — Patient Instructions (Signed)
.   Please review the attached list of medications and notify my office if there are any errors.   . Please bring all of your medications to every appointment so we can make sure that our medication list is the same as yours.   . We will have flu vaccines available after Labor Day. Please go to your pharmacy or call the office in early September to schedule you flu shot.   

## 2019-07-08 ENCOUNTER — Ambulatory Visit (INDEPENDENT_AMBULATORY_CARE_PROVIDER_SITE_OTHER): Payer: Medicare PPO | Admitting: *Deleted

## 2019-07-08 DIAGNOSIS — I428 Other cardiomyopathies: Secondary | ICD-10-CM | POA: Diagnosis not present

## 2019-07-08 DIAGNOSIS — I5022 Chronic systolic (congestive) heart failure: Secondary | ICD-10-CM

## 2019-07-08 LAB — CUP PACEART REMOTE DEVICE CHECK
Battery Remaining Longevity: 21 mo
Battery Voltage: 2.92 V
Brady Statistic AP VP Percent: 0.03 %
Brady Statistic AP VS Percent: 0.01 %
Brady Statistic AS VP Percent: 98.73 %
Brady Statistic AS VS Percent: 1.24 %
Brady Statistic RA Percent Paced: 0.04 %
Brady Statistic RV Percent Paced: 4.48 %
Date Time Interrogation Session: 20200831131606
HighPow Impedance: 57 Ohm
Implantable Lead Implant Date: 20160409
Implantable Lead Implant Date: 20160409
Implantable Lead Implant Date: 20160409
Implantable Lead Location: 753858
Implantable Lead Location: 753859
Implantable Lead Location: 753860
Implantable Lead Model: 4598
Implantable Lead Model: 5076
Implantable Pulse Generator Implant Date: 20160409
Lead Channel Impedance Value: 1121 Ohm
Lead Channel Impedance Value: 1140 Ohm
Lead Channel Impedance Value: 1178 Ohm
Lead Channel Impedance Value: 1254 Ohm
Lead Channel Impedance Value: 1349 Ohm
Lead Channel Impedance Value: 418 Ohm
Lead Channel Impedance Value: 418 Ohm
Lead Channel Impedance Value: 456 Ohm
Lead Channel Impedance Value: 532 Ohm
Lead Channel Impedance Value: 589 Ohm
Lead Channel Impedance Value: 760 Ohm
Lead Channel Impedance Value: 779 Ohm
Lead Channel Impedance Value: 931 Ohm
Lead Channel Pacing Threshold Amplitude: 0.375 V
Lead Channel Pacing Threshold Amplitude: 0.625 V
Lead Channel Pacing Threshold Amplitude: 1.875 V
Lead Channel Pacing Threshold Pulse Width: 0.4 ms
Lead Channel Pacing Threshold Pulse Width: 0.4 ms
Lead Channel Pacing Threshold Pulse Width: 0.8 ms
Lead Channel Sensing Intrinsic Amplitude: 0.875 mV
Lead Channel Sensing Intrinsic Amplitude: 0.875 mV
Lead Channel Sensing Intrinsic Amplitude: 9.875 mV
Lead Channel Sensing Intrinsic Amplitude: 9.875 mV
Lead Channel Setting Pacing Amplitude: 1.5 V
Lead Channel Setting Pacing Amplitude: 2 V
Lead Channel Setting Pacing Amplitude: 2.5 V
Lead Channel Setting Pacing Pulse Width: 0.4 ms
Lead Channel Setting Pacing Pulse Width: 0.8 ms
Lead Channel Setting Sensing Sensitivity: 0.3 mV

## 2019-07-10 DIAGNOSIS — H26491 Other secondary cataract, right eye: Secondary | ICD-10-CM | POA: Diagnosis not present

## 2019-07-17 ENCOUNTER — Encounter: Payer: Self-pay | Admitting: Cardiology

## 2019-07-17 NOTE — Progress Notes (Signed)
Remote ICD transmission.   

## 2019-07-22 ENCOUNTER — Encounter: Payer: Self-pay | Admitting: Physician Assistant

## 2019-07-22 ENCOUNTER — Ambulatory Visit (INDEPENDENT_AMBULATORY_CARE_PROVIDER_SITE_OTHER): Payer: Medicare PPO | Admitting: Physician Assistant

## 2019-07-22 ENCOUNTER — Other Ambulatory Visit: Payer: Self-pay

## 2019-07-22 VITALS — BP 103/68 | HR 78 | Temp 96.6°F | Resp 16 | Wt 128.0 lb

## 2019-07-22 DIAGNOSIS — M62838 Other muscle spasm: Secondary | ICD-10-CM | POA: Diagnosis not present

## 2019-07-22 DIAGNOSIS — Z23 Encounter for immunization: Secondary | ICD-10-CM

## 2019-07-22 MED ORDER — MELOXICAM 7.5 MG PO TABS: 7.5000 mg | ORAL_TABLET | Freq: Every day | ORAL | 0 refills | Status: DC

## 2019-07-22 MED ORDER — BACLOFEN 10 MG PO TABS: 10.0000 mg | ORAL_TABLET | Freq: Three times a day (TID) | ORAL | 0 refills | Status: DC

## 2019-07-22 NOTE — Patient Instructions (Signed)
Acute Torticollis, Adult Torticollis is a condition in which the muscles of the neck tighten (contract) abnormally, causing the neck to twist and the head to move into an unnatural position. Torticollis that develops suddenly is called acute torticollis. People with acute torticollis may have trouble turning their head. The condition can be painful and may range from mild to severe. What are the causes? This condition may be caused by:  Sleeping in an awkward position (common).  Extending or twisting the neck muscles beyond their normal position.  An injury to the neck muscles.  An infection.  A tumor.  Certain medicines.  Long-lasting spasms of the neck muscles. In some cases, the cause may not be known. What increases the risk? You are more likely to develop this condition if:  You have a condition associated with loose ligaments, such as Down syndrome.  You have a brain condition that affects vision, such as strabismus. What are the signs or symptoms? The main symptom of this condition is tilting of the head to one side. Other symptoms include:  Pain in the neck.  Trouble turning the head from side to side or up and down. How is this diagnosed? This condition may be diagnosed based on:  A physical exam.  Your medical history.  Imaging tests, such as: ? An X-ray. ? An ultrasound. ? A CT scan. ? An MRI. How is this treated? Treatment for this condition depends on what is causing the condition. Mild cases may go away without treatment. Treatment for more serious cases may include:  Medicines or shots to relax the muscles.  Other medicines, such as antibiotics to treat the underlying cause.  Wearing a soft neck collar.  Physical therapy and stretching to improve neck strength and flexibility.  Neck massage. In severe cases, surgery may be needed to repair dislocated or broken bones or to treat nerves in the neck. Follow these instructions at home:   Take  over-the-counter and prescription medicines only as told by your health care provider.  Do stretching exercises and massage your neck as told by your health care provider.  If directed, apply heat to the affected area as often as told by your health care provider. Use the heat source that your health care provider recommends, such as a moist heat pack or a heating pad. ? Place a towel between your skin and the heat source. ? Leave the heat on for 20-30 minutes. ? Remove the heat if your skin turns bright red. This is especially important if you are unable to feel pain, heat, or cold. You may have a greater risk of getting burned.  If you wake up with torticollis after sleeping, check your bed or sleeping area. Look for lumpy pillows or unusual objects. Make sure your bed and sleeping area are comfortable.  Keep all follow-up visits as told by your health care provider. This is important. Contact a health care provider if:  You have a fever.  Your symptoms do not improve or they get worse. Get help right away if:  You have trouble breathing.  You develop noisy breathing (stridor).  You start to drool.  You have trouble swallowing or pain when swallowing.  You develop numbness or weakness in your hands or feet.  You have changes in your speech, understanding, or vision.  You are in severe pain.  You cannot move your head or neck. Summary  Torticollis is a condition in which the muscles of the neck tighten (contract) abnormally, causing   the neck to twist and the head to move into an unnatural position. Torticollis that develops suddenly is called acute torticollis.  Treatment for this condition depends on what is causing the condition. Mild cases may go away without treatment.  Do stretching exercises and massage your neck as told by your health care provider. You may also be instructed to apply heat to the area.  Contact your health care provider if your symptoms do not  improve or they get worse. This information is not intended to replace advice given to you by your health care provider. Make sure you discuss any questions you have with your health care provider. Document Released: 10/21/2000 Document Revised: 10/06/2017 Document Reviewed: 12/22/2016 Elsevier Patient Education  2020 [employer] Inc[employer]   Neck Exercises Ask your health care provider which exercises are safe for you. Do exercises exactly as told by your health care provider and adjust them as directed. It is normal to feel mild stretching, pulling, tightness, or discomfort as you do these exercises. Stop right away if you feel sudden pain or your pain gets worse. Do not begin these exercises until told by your health care provider. Neck exercises can be important for many reasons. They can improve strength and maintain flexibility in your neck, which will help your upper back and prevent neck pain. Stretching exercises Rotation neck stretching  1. Sit in a chair or stand up. 2. Place your feet flat on the floor, shoulder width apart. 3. Slowly turn your head (rotate) to the right until a slight stretch is felt. Turn it all the way to the right so you can look over your right shoulder. Do not tilt or tip your head. 4. Hold this position for 10-30 seconds. 5. Slowly turn your head (rotate) to the left until a slight stretch is felt. Turn it all the way to the left so you can look over your left shoulder. Do not tilt or tip your head. 6. Hold this position for 10-30 seconds. Repeat __________ times. Complete this exercise __________ times a day. Neck retraction 1. Sit in a sturdy chair or stand up. 2. Look straight ahead. Do not bend your neck. 3. Use your fingers to push your chin backward (retraction). Do not bend your neck for this movement. Continue to face straight ahead. If you are doing the exercise properly, you will feel a slight sensation in your throat and a stretch at the back of your neck.  4. Hold the stretch for 1-2 seconds. Repeat __________ times. Complete this exercise __________ times a day. Strengthening exercises Neck press 1. Lie on your back on a firm bed or on the floor with a pillow under your head. 2. Use your neck muscles to push your head down on the pillow and straighten your spine. 3. Hold the position as well as you can. Keep your head facing up (in a neutral position) and your chin tucked. 4. Slowly count to 5 while holding this position. Repeat __________ times. Complete this exercise __________ times a day. Isometrics These are exercises in which you strengthen the muscles in your neck while keeping your neck still (isometrics). 1. Sit in a supportive chair and place your hand on your forehead. 2. Keep your head and face facing straight ahead. Do not flex or extend your neck while doing isometrics. 3. Push forward with your head and neck while pushing back with your hand. Hold for 10 seconds. 4. Do the sequence again, this time putting your hand against the   back of your head. Use your head and neck to push backward against the hand pressure. 5. Finally, do the same exercise on either side of your head, pushing sideways against the pressure of your hand. Repeat __________ times. Complete this exercise __________ times a day. Prone head lifts 1. Lie face-down (prone position), resting on your elbows so that your chest and upper back are raised. 2. Start with your head facing downward, near your chest. Position your chin either on or near your chest. 3. Slowly lift your head upward. Lift until you are looking straight ahead. Then continue lifting your head as far back as you can comfortably stretch. 4. Hold your head up for 5 seconds. Then slowly lower it to your starting position. Repeat __________ times. Complete this exercise __________ times a day. Supine head lifts 1. Lie on your back (supine position), bending your knees to point to the ceiling and  keeping your feet flat on the floor. 2. Lift your head slowly off the floor, raising your chin toward your chest. 3. Hold for 5 seconds. Repeat __________ times. Complete this exercise __________ times a day. Scapular retraction 1. Stand with your arms at your sides. Look straight ahead. 2. Slowly pull both shoulders (scapulae) backward and downward (retraction) until you feel a stretch between your shoulder blades in your upper back. 3. Hold for 10-30 seconds. 4. Relax and repeat. Repeat __________ times. Complete this exercise __________ times a day. Contact a health care provider if:  Your neck pain or discomfort gets much worse when you do an exercise.  Your neck pain or discomfort does not improve within 2 hours after you exercise. If you have any of these problems, stop exercising right away. Do not do the exercises again unless your health care provider says that you can. Get help right away if:  You develop sudden, severe neck pain. If this happens, stop exercising right away. Do not do the exercises again unless your health care provider says that you can. This information is not intended to replace advice given to you by your health care provider. Make sure you discuss any questions you have with your health care provider. Document Released: 10/05/2015 Document Revised: 08/22/2018 Document Reviewed: 08/22/2018 Elsevier Patient Education  2020 Elsevier Inc.  

## 2019-07-22 NOTE — Progress Notes (Signed)
Patient: Bethany [name] Female    DOB: [birth year]   73 y.o.   MRN: [id number] Visit Date: 07/22/2019  Today's Provider: Mar Daring, PA-C   Chief Complaint  Patient presents with  . Neck Pain   Subjective:     Neck Pain  This is a new problem. The current episode started 1 to 4 weeks ago (1 week). The pain is associated with an unknown factor. The pain is present in the left side. The quality of the pain is described as shooting, stabbing and aching. The pain is at a severity of 6/10. The pain is moderate. The symptoms are aggravated by bending, position and twisting. Stiffness is present all day. Associated symptoms include headaches. Pertinent negatives include no chest pain, fever, leg pain, numbness, pain with swallowing, paresis, photophobia, syncope, tingling, trouble swallowing, visual change, weakness or weight loss. She has tried acetaminophen for the symptoms. The treatment provided mild relief.   Patient has had left sided neck pain for 1 week. Patient states pain radiates down into her left shoulder and occasionally down into her left arm. Patient states describes pain as shooting and sometimes stabbing. Patient states neck pain causes her to have headaches. Patient has been taking acetaminophen with mild/moderate relief.   Allergies  Allergen Reactions  . Other Other (See Comments)    NO BLOOD PRODUCTS- JEHOVAHS WITNESS  . Sulfa Antibiotics Other (See Comments)    Loss of taste     Current Outpatient Medications:  .  acetaminophen (TYLENOL) 500 MG tablet, Take 1,000 mg by mouth 2 (two) times daily as needed for mild pain or moderate pain., Disp: , Rfl:  .  Alcohol Swabs (B-D SINGLE USE SWABS REGULAR) PADS, USE TO CHECK BLOOD SUGAR DAILY, Disp: 100 each, Rfl: 4 .  aspirin EC 81 MG tablet, Take 81 mg by mouth daily., Disp: , Rfl:  .  atorvastatin (LIPITOR) 20 MG tablet, TAKE 1 TABLET EVERY DAY, Disp: 90 tablet, Rfl: 4 .  carvedilol (COREG) 3.125 MG tablet,  TAKE 1/2 TABLET BY MOUTH TWICE DAILY (DOSE CHANGE), Disp: 90 tablet, Rfl: 4 .  ferrous sulfate 325 (65 FE) MG tablet, Take 325 mg by mouth 2 (two) times daily with a meal., Disp: , Rfl:  .  furosemide (LASIX) 40 MG tablet, Take 20 mg by mouth daily. , Disp: , Rfl:  .  glipiZIDE (GLIPIZIDE XL) 2.5 MG 24 hr tablet, Take 1 tablet (2.5 mg total) by mouth daily with breakfast., Disp: 90 tablet, Rfl: 1 .  JANUMET XR [phone] MG TB24, TAKE 1 TABLET DAILY. REPLACES JANUVIA AND METFORMIN, Disp: 90 tablet, Rfl: 4 .  levothyroxine (SYNTHROID) 50 MCG tablet, Take 1 tablet (50 mcg total) by mouth daily., Disp: 30 tablet, Rfl: 1 .  pantoprazole (PROTONIX) 40 MG tablet, Take 1 tablet (40 mg total) by mouth daily., Disp: 90 tablet, Rfl: 3 .  sacubitril-valsartan (ENTRESTO) 24-26 MG, Take 0.5 tablets by mouth 2 (two) times daily., Disp: , Rfl:  .  senna (SENOKOT) 8.6 MG TABS tablet, Take 1 tablet by mouth daily as needed for mild constipation., Disp: , Rfl:  .  TRUE METRIX BLOOD GLUCOSE TEST test strip, CHECK BLOOD SUGAR EVERY DAY, Disp: 100 each, Rfl: 3 .  TRUEplus Lancets 33G MISC, CHECK BLOOD SUGAR ONE TIME A DAY, Disp: 100 each, Rfl: 4 .  Vitamin D, Ergocalciferol, (DRISDOL) 1.25 MG (50000 UT) CAPS capsule, Take 1 capsule (50,000 Units total) by mouth every 7 (seven) days.,  Disp: 12 capsule, Rfl: 3  Review of Systems  Constitutional: Negative for appetite change, chills, fatigue, fever and weight loss.  HENT: Negative for trouble swallowing.   Eyes: Negative for photophobia.  Respiratory: Negative for chest tightness and shortness of breath.   Cardiovascular: Negative for chest pain, palpitations and syncope.  Gastrointestinal: Negative for abdominal pain, nausea and vomiting.  Musculoskeletal: Positive for myalgias and neck pain.  Neurological: Positive for headaches. Negative for dizziness, tingling, weakness and numbness.    Social History   Tobacco Use  . Smoking status: Never Smoker  . Smokeless  tobacco: Former Neurosurgeon    Types: Snuff  Substance Use Topics  . Alcohol use: No    Alcohol/week: 0.0 standard drinks      Objective:   BP 103/68 (BP Location: Right Arm, Patient Position: Sitting, Cuff Size: Normal)   Pulse 78   Temp (!) 96.6 F (35.9 C) (Other (Comment))   Resp 16   Wt 128 lb (58.1 kg)   LMP  (LMP Unknown)   SpO2 98%   BMI 24.19 kg/m  Vitals:   07/22/19 1346  BP: 103/68  Pulse: 78  Resp: 16  Temp: (!) 96.6 F (35.9 C)  TempSrc: Other (Comment)  SpO2: 98%  Weight: 128 lb (58.1 kg)  Body mass index is 24.19 kg/m.   Physical Exam Vitals signs reviewed.  Constitutional:      General: She is not in acute distress.    Appearance: Normal appearance. She is well-developed and normal weight. She is not ill-appearing or diaphoretic.  Neck:     Musculoskeletal: Normal range of motion and neck supple. Normal range of motion. Pain with movement and muscular tenderness present. No torticollis or spinous process tenderness.     Thyroid: No thyromegaly.     Vascular: No JVD.     Trachea: No tracheal deviation.   Cardiovascular:     Rate and Rhythm: Normal rate and regular rhythm.     Heart sounds: Normal heart sounds. No murmur. No friction rub. No gallop.   Pulmonary:     Effort: Pulmonary effort is normal. No respiratory distress.     Breath sounds: Normal breath sounds. No wheezing or rales.  Neurological:     Mental Status: She is alert.      No results found for any visits on 07/22/19.     Assessment & Plan    1. Muscle spasms of neck Will try meloxicam and baclofen as below. Advised to use moist heat to the area. Neck exercises printed on AVS. Call if not improving.  - meloxicam (MOBIC) 7.5 MG tablet; Take 1 tablet (7.5 mg total) by mouth daily.  Dispense: 14 tablet; Refill: 0 - baclofen (LIORESAL) 10 MG tablet; Take 1 tablet (10 mg total) by mouth 3 (three) times daily.  Dispense: 30 each; Refill: 0  2. Need for influenza vaccination Flu  vaccine given today without complication. Patient sat upright for 15 minutes to check for adverse reaction before being released. - Flu Vaccine QUAD High Dose(Fluad)     Margaretann Loveless, PA-C  [facility]

## 2019-07-24 DIAGNOSIS — Z95810 Presence of automatic (implantable) cardiac defibrillator: Secondary | ICD-10-CM | POA: Diagnosis not present

## 2019-07-24 DIAGNOSIS — I5022 Chronic systolic (congestive) heart failure: Secondary | ICD-10-CM | POA: Diagnosis not present

## 2019-07-24 DIAGNOSIS — E782 Mixed hyperlipidemia: Secondary | ICD-10-CM | POA: Diagnosis not present

## 2019-07-24 DIAGNOSIS — I447 Left bundle-branch block, unspecified: Secondary | ICD-10-CM | POA: Diagnosis not present

## 2019-07-24 DIAGNOSIS — I10 Essential (primary) hypertension: Secondary | ICD-10-CM | POA: Diagnosis not present

## 2019-07-25 ENCOUNTER — Other Ambulatory Visit: Payer: Self-pay | Admitting: Family Medicine

## 2019-07-25 DIAGNOSIS — E039 Hypothyroidism, unspecified: Secondary | ICD-10-CM

## 2019-08-01 ENCOUNTER — Other Ambulatory Visit: Payer: Self-pay

## 2019-08-01 ENCOUNTER — Ambulatory Visit: Payer: Medicare PPO | Admitting: Podiatry

## 2019-08-01 ENCOUNTER — Encounter: Payer: Self-pay | Admitting: Podiatry

## 2019-08-01 DIAGNOSIS — L600 Ingrowing nail: Secondary | ICD-10-CM

## 2019-08-01 DIAGNOSIS — E119 Type 2 diabetes mellitus without complications: Secondary | ICD-10-CM | POA: Diagnosis not present

## 2019-08-01 DIAGNOSIS — M79674 Pain in right toe(s): Secondary | ICD-10-CM | POA: Diagnosis not present

## 2019-08-01 DIAGNOSIS — B351 Tinea unguium: Secondary | ICD-10-CM

## 2019-08-01 NOTE — Progress Notes (Signed)
This patient presents the office with chief complaint of pain on the outside  border big toe right foot.  She says this is been painful for the last month especially wearing shoes.  .  Patient states that she has provided no self treatment for this condition.  She presents the office for an evaluation and treatment of this painful thick ingrowing nail.  Patient is diabetic.  Vascular  Dorsalis pedis  are palpable  B/L. Posterior tibial pulses are absent. Capillary return  WNL.  Temperature gradient is  WNL.  Skin turgor  WNL  Sensorium  Bethany [name] monofilament wire  WNL. Normal tactile sensation.  Nail Exam  Patient has long thick painful nails  B/L.Marland Kitchen Her right hallux nail is thickened and ingrown lateral border right hallux.  Orthopedic  Exam  Muscle tone and muscle strength  WNL.  No limitations of motion feet  B/L.  No crepitus or joint effusion noted.  Foot type is unremarkable and digits show no abnormalities.  Bony prominences are unremarkable. Pes planus  TNJ DJD  B/L.  DJD 1st MPJ  B/L.  Skin  No open lesions.  Normal skin texture and turgor.  Ingrown Nail  Onychomycosis right hallux nail.  Debride nail  RTC prn Discussed the tendency for ingrown toenails and possible nail surgery.   Gardiner Barefoot DPM

## 2019-08-19 ENCOUNTER — Other Ambulatory Visit: Payer: Self-pay

## 2019-08-19 ENCOUNTER — Encounter: Payer: Self-pay | Admitting: Family Medicine

## 2019-08-19 ENCOUNTER — Ambulatory Visit (INDEPENDENT_AMBULATORY_CARE_PROVIDER_SITE_OTHER): Payer: Medicare PPO | Admitting: Family Medicine

## 2019-08-19 VITALS — BP 112/66 | HR 74 | Temp 96.6°F | Resp 16 | Wt 135.0 lb

## 2019-08-19 DIAGNOSIS — E119 Type 2 diabetes mellitus without complications: Secondary | ICD-10-CM

## 2019-08-19 LAB — POCT GLYCOSYLATED HEMOGLOBIN (HGB A1C)
Est. average glucose Bld gHb Est-mCnc: 160
Hemoglobin A1C: 7.2 % — AB (ref 4.0–5.6)

## 2019-08-19 NOTE — Patient Instructions (Signed)
.   Please review the attached list of medications and notify my office if there are any errors.   . Please bring all of your medications to every appointment so we can make sure that our medication list is the same as yours.   . It is especially important to get the annual flu vaccine this year. If you haven't had it already, please go to your pharmacy or call the office as soon as possible to schedule you flu shot.  

## 2019-08-19 NOTE — Progress Notes (Signed)
Patient: Bethany [name] Female    DOB: 12-Apr-1946   73 y.o.   MRN: [id number] Visit Date: 08/19/2019  Today's Provider: Lelon Huh, MD   Chief Complaint  Patient presents with  . Diabetes   Subjective:     HPI  Diabetes Mellitus Type II, Follow-up:   Lab Results  Component Value Date   HGBA1C 7.2 (A) 08/19/2019   HGBA1C 7.3 (A) 03/13/2019   HGBA1C 7.4 (A) 12/04/2018    Last seen for diabetes 4 months ago.  Management since then includes no changes. She reports good compliance with treatment. She is not having side effects.  Current symptoms include none and have been stable. Home blood sugar records: fasting range: 70-127  Episodes of hypoglycemia? yes - occasionally   Current insulin regiment: Is not on insulin Most Recent Eye Exam: 05/27/2019 Weight trend: fluctuating a bit Prior visit with dietician: No Current exercise: walking Current diet habits: well balanced  Pertinent Labs:    Component Value Date/Time   CHOL 115 04/04/2018 1502   CHOL 130 02/21/2014 0014   TRIG 100 04/04/2018 1502   TRIG 94 02/21/2014 0014   HDL 54 04/04/2018 1502   HDL 42 02/21/2014 0014   LDLCALC 41 04/04/2018 1502   LDLCALC 69 02/21/2014 0014   CREATININE 0.77 03/07/2019 1752   CREATININE 0.70 02/04/2015 1426    Wt Readings from Last 3 Encounters:  08/19/19 135 lb (61.2 kg)  07/22/19 128 lb (58.1 kg)  06/11/19 131 lb (59.4 kg)    ------------------------------------------------------------------------  Follow up for Hypothyroidism:  The patient was last seen for this 4 months ago. Changes made at last visit include none.  She reports good compliance with treatment. She feels that condition is Unchanged. She is not having side effects.   ------------------------------------------------------------------------------------  Allergies  Allergen Reactions  . Other Other (See Comments)    NO BLOOD PRODUCTS- JEHOVAHS WITNESS  . Sulfa Antibiotics Other  (See Comments)    Loss of taste     Current Outpatient Medications:  .  acetaminophen (TYLENOL) 500 MG tablet, Take 1,000 mg by mouth 2 (two) times daily as needed for mild pain or moderate pain., Disp: , Rfl:  .  Alcohol Swabs (B-D SINGLE USE SWABS REGULAR) PADS, USE TO CHECK BLOOD SUGAR DAILY, Disp: 100 each, Rfl: 4 .  aspirin EC 81 MG tablet, Take 81 mg by mouth daily., Disp: , Rfl:  .  atorvastatin (LIPITOR) 20 MG tablet, TAKE 1 TABLET EVERY DAY, Disp: 90 tablet, Rfl: 4 .  baclofen (LIORESAL) 10 MG tablet, Take 1 tablet (10 mg total) by mouth 3 (three) times daily., Disp: 30 each, Rfl: 0 .  carvedilol (COREG) 3.125 MG tablet, TAKE 1/2 TABLET BY MOUTH TWICE DAILY (DOSE CHANGE), Disp: 90 tablet, Rfl: 4 .  ferrous sulfate 325 (65 FE) MG tablet, Take 325 mg by mouth 2 (two) times daily with a meal., Disp: , Rfl:  .  furosemide (LASIX) 40 MG tablet, Take 20 mg by mouth daily. , Disp: , Rfl:  .  glipiZIDE (GLIPIZIDE XL) 2.5 MG 24 hr tablet, Take 1 tablet (2.5 mg total) by mouth daily with breakfast., Disp: 90 tablet, Rfl: 1 .  JANUMET XR [phone] MG TB24, TAKE 1 TABLET DAILY. REPLACES JANUVIA AND METFORMIN, Disp: 90 tablet, Rfl: 4 .  meloxicam (MOBIC) 7.5 MG tablet, Take 1 tablet (7.5 mg total) by mouth daily., Disp: 14 tablet, Rfl: 0 .  pantoprazole (PROTONIX) 40 MG tablet, Take 1  tablet (40 mg total) by mouth daily., Disp: 90 tablet, Rfl: 3 .  sacubitril-valsartan (ENTRESTO) 24-26 MG, Take 0.5 tablets by mouth 2 (two) times daily., Disp: , Rfl:  .  senna (SENOKOT) 8.6 MG TABS tablet, Take 1 tablet by mouth daily as needed for mild constipation., Disp: , Rfl:  .  SYNTHROID 50 MCG tablet, TAKE 1 TABLET EVERY DAY, Disp: 90 tablet, Rfl: 4 .  TRUE METRIX BLOOD GLUCOSE TEST test strip, CHECK BLOOD SUGAR EVERY DAY, Disp: 100 each, Rfl: 3 .  TRUEplus Lancets 33G MISC, CHECK BLOOD SUGAR ONE TIME A DAY, Disp: 100 each, Rfl: 4 .  Vitamin D, Ergocalciferol, (DRISDOL) 1.25 MG (50000 UT) CAPS capsule, Take  1 capsule (50,000 Units total) by mouth every 7 (seven) days., Disp: 12 capsule, Rfl: 3  Review of Systems  Constitutional: Negative for appetite change, chills, fatigue and fever.  Respiratory: Negative for chest tightness and shortness of breath.   Cardiovascular: Negative for chest pain and palpitations.  Gastrointestinal: Negative for abdominal pain, nausea and vomiting.  Neurological: Negative for dizziness and weakness.    Social History   Tobacco Use  . Smoking status: Never Smoker  . Smokeless tobacco: Former Neurosurgeon    Types: Snuff  Substance Use Topics  . Alcohol use: No    Alcohol/week: 0.0 standard drinks      Objective:   BP 112/66 (BP Location: Left Arm, Patient Position: Sitting, Cuff Size: Normal)   Pulse 74   Temp (!) 96.6 F (35.9 C) (Temporal)   Resp 16   Wt 135 lb (61.2 kg)   LMP  (LMP Unknown)   SpO2 98% Comment: room air  BMI 25.51 kg/m  Vitals:   08/19/19 0857  BP: 112/66  Pulse: 74  Resp: 16  Temp: (!) 96.6 F (35.9 C)  TempSrc: Temporal  SpO2: 98%  Weight: 135 lb (61.2 kg)  Body mass index is 25.51 kg/m.   Physical Exam   General appearance: Well developed, well nourished female, cooperative and in no acute distress Head: Normocephalic, without obvious abnormality, atraumatic Respiratory: Respirations even and unlabored, normal respiratory rate Extremities: All extremities are intact.  Skin: Skin color, texture, turgor normal. No rashes seen  Psych: Appropriate mood and affect. Neurologic: Mental status: Alert, oriented to person, place, and time, thought content appropriate.  Results for orders placed or performed in visit on 08/19/19  POCT HgB A1C  Result Value Ref Range   Hemoglobin A1C 7.2 (A) 4.0 - 5.6 %   Est. average glucose Bld gHb Est-mCnc 160        Assessment & Plan    1. Type 2 diabetes mellitus without complication, without long-term current use of insulin (HCC) Well controlled.  Continue current medications.     Follow up 4 months   The entirety of the information documented in the History of Present Illness, Review of Systems and Physical Exam were personally obtained by me. Portions of this information were initially documented by Awilda Bill, CMA and reviewed by me for thoroughness and accuracy.      [name], MD  [facility]

## 2019-08-20 ENCOUNTER — Encounter: Payer: Medicare PPO | Admitting: Internal Medicine

## 2019-08-21 ENCOUNTER — Other Ambulatory Visit: Payer: Self-pay | Admitting: Family Medicine

## 2019-08-21 DIAGNOSIS — E119 Type 2 diabetes mellitus without complications: Secondary | ICD-10-CM

## 2019-08-21 NOTE — Telephone Encounter (Signed)
Pt needing a refill on:  TRUE METRIX BLOOD GLUCOSE TEST test strip  Please fill at:  [facility], [place] [phone] (Phone) [phone] (Fax)   Thanks, [facility]

## 2019-08-22 MED ORDER — TRUE METRIX BLOOD GLUCOSE TEST VI STRP: ORAL_STRIP | 3 refills | Status: DC

## 2019-09-02 ENCOUNTER — Telehealth: Payer: Self-pay | Admitting: Family Medicine

## 2019-09-02 DIAGNOSIS — K219 Gastro-esophageal reflux disease without esophagitis: Secondary | ICD-10-CM

## 2019-09-02 MED ORDER — PANTOPRAZOLE SODIUM 40 MG PO TBEC: 40.0000 mg | DELAYED_RELEASE_TABLET | Freq: Every day | ORAL | 3 refills | Status: DC

## 2019-09-02 NOTE — Telephone Encounter (Signed)
Pt requesting refill of pantoprazole (PROTONIX) 40 MG tablet sent to [facility].

## 2019-09-03 ENCOUNTER — Telehealth: Payer: Self-pay

## 2019-09-03 ENCOUNTER — Encounter: Payer: Self-pay | Admitting: Internal Medicine

## 2019-09-03 ENCOUNTER — Telehealth (INDEPENDENT_AMBULATORY_CARE_PROVIDER_SITE_OTHER): Payer: Medicare PPO | Admitting: Internal Medicine

## 2019-09-03 ENCOUNTER — Other Ambulatory Visit: Payer: Self-pay

## 2019-09-03 ENCOUNTER — Ambulatory Visit (INDEPENDENT_AMBULATORY_CARE_PROVIDER_SITE_OTHER): Payer: Medicare PPO | Admitting: *Deleted

## 2019-09-03 VITALS — BP 100/58 | HR 80 | Ht 61.0 in | Wt 132.0 lb

## 2019-09-03 DIAGNOSIS — Z95810 Presence of automatic (implantable) cardiac defibrillator: Secondary | ICD-10-CM | POA: Diagnosis not present

## 2019-09-03 DIAGNOSIS — I5022 Chronic systolic (congestive) heart failure: Secondary | ICD-10-CM

## 2019-09-03 DIAGNOSIS — I428 Other cardiomyopathies: Secondary | ICD-10-CM

## 2019-09-03 DIAGNOSIS — I447 Left bundle-branch block, unspecified: Secondary | ICD-10-CM | POA: Diagnosis not present

## 2019-09-03 NOTE — Patient Instructions (Addendum)
Medication Instructions:  - Your physician has recommended you make the following change in your medication:   1) Increase lasix (furosemide) x 5 days, then resume your normal dosing  - if you are currently taking furosemide 20 mg once daily, then increase to 40 mg once daily x 5 days - if you are currently taking furosemide 40 mg once daily, then increase to 80 mg once daily x 5 days  (if you could call us to clarify what your actual dose is we would appreciate it!)  *If you need a refill on your cardiac medications before your next appointment, please call your pharmacy*  Lab Work: - none ordered  If you have labs (blood work) drawn today and your tests are completely normal, you will receive your results only by: Marland Kitchen MyChart Message (if you have MyChart) OR . A paper copy in the mail If you have any lab test that is abnormal or we need to change your treatment, we will call you to review the results.  Testing/Procedures: - none ordered  Follow-Up: At [facility], you and your health needs are our priority.  As part of our continuing mission to provide you with exceptional heart care, we have created designated Provider Care Teams.  These Care Teams include your primary Cardiologist (physician) and Advanced Practice Providers (APPs -  Physician Assistants and Nurse Practitioners) who all work together to provide you with the care you need, when you need it.  Your next appointment:   12 months  The format for your next appointment:   In Person  Provider:   Virl Axe, MD  Other Instructions - n/a

## 2019-09-03 NOTE — Progress Notes (Signed)
Electrophysiology TeleHealth Note   Due to national recommendations of social distancing due to COVID 19, an audio/video telehealth visit is felt to be most appropriate for this patient at this time.  See MyChart message from today for the patient's consent to telehealth for [facility].   Date:  09/03/2019   ID:  Bethany [name], DOB July 05, 1946, MRN [id number]  Location: patient's home  Provider location: [address] [place]  Evaluation Performed: Follow-up visit  PCP:  Birdie Sons, MD  Cardiologist:   B-[facility] Electrophysiologist:  SK   Chief Complaint:  ICD and CHF  History of Present Illness:    Bethany [name] is a 73 y.o. female who presents via Engineer, civil (consulting) for a telehealth visit today.  Since last being seen in our clinic,in follow-up for CRT-D implantation 4/16.w history of a nonischemic cardiomyopathy;     the patient reports intermittent problems with shortness of breath abdominal swelling and peripheral edema   DATE TEST EF   2012 Echo  20-25%   2012 LHC  Normal CAs  3/16 Echo   15-20 %   3/17 Echo  35%   3/20 Echo ([place]) 45%        More abdominal swelling and edema she notes that her diet has been more replete with sodium as well as fluids.  Sometimes she drinks just because she thinks she will feel better.  Does not notice a very brisk response to her furosemide.  No nocturnal dyspnea or orthopnea.  No palpitations presyncope or syncope.  Date  Cr K Hgb  5/19 .75 3.8    4/20 0.77 3.8 11.2     Date WBC Hgb Plt  2/18 3.0 11.5 95  5/19 3.4 10.9 122  4/20  11.2       The patient denies symptoms of fevers, chills, cough, or new SOB worrisome for COVID 19.   Past Medical History:  Diagnosis Date  . AICD (automatic cardioverter/defibrillator) present   . Anemia   . Anxiety   . CHF (congestive heart failure) ([place])   . Complication of anesthesia   . Depression   . Diabetes ([place])   . Dilated  cardiomyopathy ([place])   . Diverticulosis   . Dyspnea   . Dysrhythmia   . Edema    FEET/ANKLES OCCAS  . GERD (gastroesophageal reflux disease)   . Hyperlipidemia   . Hypothyroidism   . LV dysfunction   . Orthopnea    USES 3 PILLOWS  . PONV (postoperative nausea and vomiting)   . Reflux   . Thrombocytopenia ([place])   . Thyroid disease   . VHD (valvular heart disease)     Past Surgical History:  Procedure Laterality Date  . ABDOMINAL HYSTERECTOMY    . BI-VENTRICULAR IMPLANTABLE CARDIOVERTER DEFIBRILLATOR [name] 02/11/2015   Procedure: BI-VENTRICULAR IMPLANTABLE CARDIOVERTER DEFIBRILLATOR  (CRT-D);  Surgeon: Deboraha Sprang, MD;  Location: [facility] CATH LAB;  Service: Cardiovascular;  Laterality: [name];  . BONE MARROW BIOPSY  2007   spine  . CARDIAC CATHETERIZATION    . CATARACT EXTRACTION W/PHACO Right 01/03/2017   Procedure: CATARACT EXTRACTION PHACO AND INTRAOCULAR LENS PLACEMENT (IOC);  Surgeon: Leandrew Koyanagi, MD;  Location: [facility] ORS;  Service: Ophthalmology;  Laterality: Right;  [place] 00:50 AP% 19.5 CDE 9.94 fluid pack lot # [id number]  . CATARACT EXTRACTION W/PHACO Left 10/27/2016   Procedure: CATARACT EXTRACTION PHACO AND INTRAOCULAR LENS PLACEMENT (IOC);  Surgeon: Leandrew Koyanagi, MD;  Location: [facility] ORS;  Service: Ophthalmology;  Laterality: Left;  [place]  01:12 AP%  21.0 CDE  11.59 Fluid pack lot # [id number]  . CHOLECYSTECTOMY    . INSERT / REPLACE / REMOVE PACEMAKER     AICD  . LEAD REVISION [name] 02/11/2015   Procedure: LEAD REVISION;  Surgeon: Deboraha Sprang, MD;  Location: [facility] CATH LAB;  Service: Cardiovascular;  Laterality: [name];  . PARTIAL HYSTERECTOMY  1977  . TOOTH EXTRACTION  09/23/2015    Current Outpatient Medications  Medication Sig Dispense Refill  . acetaminophen (TYLENOL) 500 MG tablet Take 1,000 mg by mouth 2 (two) times daily as needed for mild pain or moderate pain.    . Alcohol Swabs (B-D SINGLE USE SWABS REGULAR) PADS USE TO CHECK BLOOD SUGAR DAILY 100 each 4  .  aspirin EC 81 MG tablet Take 81 mg by mouth daily.    Marland Kitchen atorvastatin (LIPITOR) 20 MG tablet TAKE 1 TABLET EVERY DAY 90 tablet 4  . baclofen (LIORESAL) 10 MG tablet Take 1 tablet (10 mg total) by mouth 3 (three) times daily. 30 each 0  . carvedilol (COREG) 3.125 MG tablet TAKE 1/2 TABLET BY MOUTH TWICE DAILY (DOSE CHANGE) 90 tablet 4  . ferrous sulfate 325 (65 FE) MG tablet Take 325 mg by mouth 2 (two) times daily with a meal.    . furosemide (LASIX) 40 MG tablet Take 20 mg by mouth daily.     Marland Kitchen glipiZIDE (GLIPIZIDE XL) 2.5 MG 24 hr tablet Take 1 tablet (2.5 mg total) by mouth daily with breakfast. 90 tablet 1  . glucose blood (TRUE METRIX BLOOD GLUCOSE TEST) test strip CHECK BLOOD SUGAR EVERY DAY 100 each 3  . JANUMET XR [phone] MG TB24 TAKE 1 TABLET DAILY. REPLACES JANUVIA AND METFORMIN 90 tablet 4  . meloxicam (MOBIC) 7.5 MG tablet Take 1 tablet (7.5 mg total) by mouth daily. 14 tablet 0  . pantoprazole (PROTONIX) 40 MG tablet Take 1 tablet (40 mg total) by mouth daily. 90 tablet 3  . sacubitril-valsartan (ENTRESTO) 24-26 MG Take 0.5 tablets by mouth 2 (two) times daily.    Marland Kitchen senna (SENOKOT) 8.6 MG TABS tablet Take 1 tablet by mouth daily as needed for mild constipation.    Marland Kitchen SYNTHROID 50 MCG tablet TAKE 1 TABLET EVERY DAY 90 tablet 4  . TRUEplus Lancets 33G MISC CHECK BLOOD SUGAR ONE TIME A DAY 100 each 4  . Vitamin D, Ergocalciferol, (DRISDOL) 1.25 MG (50000 UT) CAPS capsule Take 1 capsule (50,000 Units total) by mouth every 7 (seven) days. 12 capsule 3   No current facility-administered medications for this visit.     Allergies:   Other and Sulfa antibiotics   Social History:  The patient  reports that she has never smoked. She quit smokeless tobacco use about 49 years ago.  Her smokeless tobacco use included snuff. She reports that she does not drink alcohol or use drugs.   Family History:  The patient's   family history includes Diabetes in her sister; Heart attack in her brother,  mother, and sister; Heart disease in her mother; Prostate cancer in her father; Stroke in her brother and sister; Ulcers in her father.   ROS:  Please see the history of present illness.   All other systems are personally reviewed and negative.    Exam:    Vital Signs:  BP (!) 100/58 (BP Location: Left Arm, Patient Position: Sitting, Cuff Size: Normal)   Pulse 80   Ht '5\' 1"'  (1.549 m)   Wt  132 lb (59.9 kg)   LMP  (LMP Unknown)   BMI 24.94 kg/m     Well appearing, alert and conversant, regular work of breathing,  good skin color Eyes- anicteric, neuro- grossly intact, skin- no apparent rash or lesions or cyanosis, mouth- oral mucosa is pink   Labs/Other Tests and Data Reviewed:    Recent Labs: 12/04/2018: ALT 19 03/07/2019: BUN 13; Creatinine, Ser 0.77; Hemoglobin 11.2; Platelets 101; Potassium 3.8; Sodium 139 03/13/2019: BNP 17.8; Magnesium 1.6; TSH 1.890   Wt Readings from Last 3 Encounters:  09/03/19 132 lb (59.9 kg)  08/19/19 135 lb (61.2 kg)  07/22/19 128 lb (58.1 kg)     Other studies personally reviewed: Additional studies/ records that were reviewed today include: As above   Device interrogation in office today demonstrated normal device function no significant arrhythmia  ASSESSMENT & PLAN:   NICM  CHF systolic chronic/acute class 2B   LBBB  Hypotension/dizziness  Fatigue  CRT D implanted 4/16   LV function continues to improve as noted by the echocardiogram from [facility] 3/20.  We have discussed the physiology of heart failure including the importance of salt restriction and fluid restriction and have reviewed sources of dietary salt and water.  We will increase her furosemide by 100% for 5 days.  (It is unclear to me from her [facility] with her she is taking one 20 mg tablet or one 40 mg tablet. She will clarify for [place].    COVID 19 screen The patient denies symptoms of COVID 19 at this time.  The importance of social distancing was discussed today.   Follow-up:  50mNext remote: [employer]   Current medicines are reviewed at length with the patient today.   The patient does not have concerns regarding her medicines.  The following changes were made today:   Increase her furosemide x5 days  Labs/ tests ordered today include:   No orders of the defined types were placed in this encounter.   Future tests ( post COVID )     Patient Risk:  after full review of this patients clinical status, I feel that they are at moderate risk at this time.  Today, I have spent 11 minutes with the patient with telehealth technology discussing the above.  Signed, SVirl Axe MD  09/03/2019 11:27 AM     [place] [phone](office) [phone](fax)

## 2019-09-03 NOTE — Telephone Encounter (Signed)
Virtual Visit Pre-Appointment Phone Call  "(Name), I am calling you today to discuss your upcoming appointment. We are currently trying to limit exposure to the virus that causes COVID-19 by seeing patients at home rather than in the office."  1. "What is the BEST phone number to call the day of the visit?" - include this in appointment notes  2. "Do you have or have access to (through a family member/friend) a smartphone with video capability that we can use for your visit?" a. If yes - list this number in appt notes as "cell" (if different from BEST phone #) and list the appointment type as a VIDEO visit in appointment notes b. If no - list the appointment type as a PHONE visit in appointment notes  3. Confirm consent - "In the setting of the current Covid19 crisis, you are scheduled for a (phone or video) visit with your provider on (date) at (time).  Just as we do with many in-office visits, in order for you to participate in this visit, we must obtain consent.  If you'd like, I can send this to your mychart (if signed up) or email for you to review.  Otherwise, I can obtain your verbal consent now.  All virtual visits are billed to your insurance company just like a normal visit would be.  By agreeing to a virtual visit, we'd like you to understand that the technology does not allow for your provider to perform an examination, and thus may limit your provider's ability to fully assess your condition. If your provider identifies any concerns that need to be evaluated in person, we will make arrangements to do so.  Finally, though the technology is pretty good, we cannot assure that it will always work on either your or our end, and in the setting of a video visit, we may have to convert it to a phone-only visit.  In either situation, we cannot ensure that we have a secure connection.  Are you willing to proceed?" STAFF: Did the patient verbally acknowledge consent to telehealth visit? Document  YES/NO here: YES   4. Advise patient to be prepared - "Two hours prior to your appointment, go ahead and check your blood pressure, pulse, oxygen saturation, and your weight (if you have the equipment to check those) and write them all down. When your visit starts, your provider will ask you for this information. If you have an Apple Watch or Kardia device, please plan to have heart rate information ready on the day of your appointment. Please have a pen and paper handy nearby the day of the visit as well."  5. Give patient instructions for MyChart download to smartphone OR Doximity/Doxy.me as below if video visit (depending on what platform provider is using)  6. Inform patient they will receive a phone call 15 minutes prior to their appointment time (may be from unknown caller ID) so they should be prepared to answer    TELEPHONE CALL NOTE  Devona Holmes E[place]olt has been deemed a candidate for a follow-up tele-health visit to limit community exposure during the Covid-19 pandemic. I spoke with the patient via phone to ensure availability of phone/video source, confirm preferred email & phone number, and discuss instructions and expectations.  I reminded Ledell Peoples Lloyd to be prepared with any vital sign and/or heart rhythm information that could potentially be obtained via home monitoring, at the time of her visit. I reminded Ledell Peoples Schonberg to expect a phone call prior  to her visit.  Bishop Dublin, CMA 09/03/2019 11:41 AM   INSTRUCTIONS FOR DOWNLOADING THE MYCHART APP TO SMARTPHONE  - The patient must first make sure to have activated MyChart and know their login information - If Apple, go to [employer] and type in MyChart in the search bar and download the app. If Android, ask patient to go to [employer] and type in [place] in the search bar and download the app. The app is free but as with any other app downloads, their phone may require them to verify saved payment information or  Apple/Android password.  - The patient will need to then log into the app with their MyChart username and password, and select  as their healthcare provider to link the account. When it is time for your visit, go to the MyChart app, find appointments, and click Begin Video Visit. Be sure to Select Allow for your device to access the Microphone and Camera for your visit. You will then be connected, and your provider will be with you shortly.  **If they have any issues connecting, or need assistance please contact MyChart service desk [phone])**  **If using a computer, in order to ensure the best quality for their visit they will need to use either of the following Internet Browsers: [employer] or Google Chrome**  IF USING DOXIMITY or DOXY.ME - The patient will receive a link just prior to their visit by text.     FULL LENGTH CONSENT FOR TELE-HEALTH VISIT   I hereby voluntarily request, consent and authorize [facility] and its employed or contracted physicians, physician assistants, nurse practitioners or other licensed health care professionals (the Practitioner), to provide me with telemedicine health care services (the "Services") as deemed necessary by the treating Practitioner. I acknowledge and consent to receive the Services by the Practitioner via telemedicine. I understand that the telemedicine visit will involve communicating with the Practitioner through live audiovisual communication technology and the disclosure of certain medical information by electronic transmission. I acknowledge that I have been given the opportunity to request an in-person assessment or other available alternative prior to the telemedicine visit and am voluntarily participating in the telemedicine visit.  I understand that I have the right to withhold or withdraw my consent to the use of telemedicine in the course of my care at any time, without affecting my right to future care  or treatment, and that the Practitioner or I may terminate the telemedicine visit at any time. I understand that I have the right to inspect all information obtained and/or recorded in the course of the telemedicine visit and may receive copies of available information for a reasonable fee.  I understand that some of the potential risks of receiving the Services via telemedicine include:  Marland Kitchen Delay or interruption in medical evaluation due to technological equipment failure or disruption; . Information transmitted may not be sufficient (e.g. poor resolution of images) to allow for appropriate medical decision making by the Practitioner; and/or  . In rare instances, security protocols could fail, causing a breach of personal health information.  Furthermore, I acknowledge that it is my responsibility to provide information about my medical history, conditions and care that is complete and accurate to the best of my ability. I acknowledge that Practitioner's advice, recommendations, and/or decision may be based on factors not within their control, such as incomplete or inaccurate data provided by me or distortions of diagnostic images or specimens that may result from electronic transmissions.  I understand that the practice of medicine is not an exact science and that Practitioner makes no warranties or guarantees regarding treatment outcomes. I acknowledge that I will receive a copy of this consent concurrently upon execution via email to the email address I last provided but may also request a printed copy by calling the office of [place].    I understand that my insurance will be billed for this visit.   I have read or had this consent read to me. . I understand the contents of this consent, which adequately explains the benefits and risks of the Services being provided via telemedicine.  . I have been provided ample opportunity to ask questions regarding this consent and the Services and have had  my questions answered to my satisfaction. . I give my informed consent for the services to be provided through the use of telemedicine in my medical care  By participating in this telemedicine visit I agree to the above.

## 2019-09-04 ENCOUNTER — Other Ambulatory Visit: Payer: Self-pay | Admitting: Family Medicine

## 2019-09-04 DIAGNOSIS — E119 Type 2 diabetes mellitus without complications: Secondary | ICD-10-CM

## 2019-09-10 LAB — CUP PACEART INCLINIC DEVICE CHECK
Battery Remaining Longevity: 21 mo
Battery Voltage: 2.92 V
Brady Statistic AP VP Percent: 0.02 %
Brady Statistic AP VS Percent: 0.01 %
Brady Statistic AS VP Percent: 98.71 %
Brady Statistic AS VS Percent: 1.25 %
Brady Statistic RA Percent Paced: 0.04 %
Brady Statistic RV Percent Paced: 6.5 %
Date Time Interrogation Session: 20201027151044
HighPow Impedance: 58 Ohm
Implantable Lead Implant Date: 20160409
Implantable Lead Implant Date: 20160409
Implantable Lead Implant Date: 20160409
Implantable Lead Location: 753858
Implantable Lead Location: 753859
Implantable Lead Location: 753860
Implantable Lead Model: 4598
Implantable Lead Model: 5076
Implantable Pulse Generator Implant Date: 20160409
Lead Channel Impedance Value: 1121 Ohm
Lead Channel Impedance Value: 1178 Ohm
Lead Channel Impedance Value: 1178 Ohm
Lead Channel Impedance Value: 1254 Ohm
Lead Channel Impedance Value: 1368 Ohm
Lead Channel Impedance Value: 418 Ohm
Lead Channel Impedance Value: 456 Ohm
Lead Channel Impedance Value: 456 Ohm
Lead Channel Impedance Value: 532 Ohm
Lead Channel Impedance Value: 608 Ohm
Lead Channel Impedance Value: 817 Ohm
Lead Channel Impedance Value: 817 Ohm
Lead Channel Impedance Value: 950 Ohm
Lead Channel Pacing Threshold Amplitude: 0.5 V
Lead Channel Pacing Threshold Amplitude: 0.75 V
Lead Channel Pacing Threshold Amplitude: 1.75 V
Lead Channel Pacing Threshold Pulse Width: 0.4 ms
Lead Channel Pacing Threshold Pulse Width: 0.4 ms
Lead Channel Pacing Threshold Pulse Width: 0.8 ms
Lead Channel Sensing Intrinsic Amplitude: 1 mV
Lead Channel Sensing Intrinsic Amplitude: 10.75 mV
Lead Channel Setting Pacing Amplitude: 1.5 V
Lead Channel Setting Pacing Amplitude: 2 V
Lead Channel Setting Pacing Amplitude: 2.5 V
Lead Channel Setting Pacing Pulse Width: 0.4 ms
Lead Channel Setting Pacing Pulse Width: 0.8 ms
Lead Channel Setting Sensing Sensitivity: 0.45 mV

## 2019-09-10 NOTE — Progress Notes (Signed)
CRT-D device check in office. Thresholds and sensing consistent with previous device measurements. Lead impedance trends stable over time. No mode switch episodes recorded. 1 NSVT episode, 3sec duration. Patient CRT pacing 95.6% of the time, 93.2% LV only pacing. Device programmed with appropriate safety margins; RV sensitivity reduced to 0.76mV due to intermittent TWOS with BiVP with RV sensitivity programmed at 0.42mV, SK aware. Heart failure diagnostics reviewed and trends are stable for patient. Audible alerts demonstrated for patient. Estimated longevity 1.7 years. Patient enrolled in remote follow up. Patient education completed including shock plan. Carelink on 10/07/19 and ROV with Dr. Caryl Comes in 1 year.

## 2019-09-18 ENCOUNTER — Other Ambulatory Visit: Payer: Self-pay | Admitting: Family Medicine

## 2019-09-18 DIAGNOSIS — E119 Type 2 diabetes mellitus without complications: Secondary | ICD-10-CM

## 2019-10-07 ENCOUNTER — Ambulatory Visit (INDEPENDENT_AMBULATORY_CARE_PROVIDER_SITE_OTHER): Payer: Medicare PPO | Admitting: *Deleted

## 2019-10-07 DIAGNOSIS — Z95810 Presence of automatic (implantable) cardiac defibrillator: Secondary | ICD-10-CM

## 2019-10-07 LAB — CUP PACEART REMOTE DEVICE CHECK
Battery Remaining Longevity: 21 mo
Battery Voltage: 2.92 V
Brady Statistic AP VP Percent: 0.02 %
Brady Statistic AP VS Percent: 0.01 %
Brady Statistic AS VP Percent: 98.48 %
Brady Statistic AS VS Percent: 1.49 %
Brady Statistic RA Percent Paced: 0.03 %
Brady Statistic RV Percent Paced: 11.67 %
Date Time Interrogation Session: 20201130012503
HighPow Impedance: 54 Ohm
Implantable Lead Implant Date: 20160409
Implantable Lead Implant Date: 20160409
Implantable Lead Implant Date: 20160409
Implantable Lead Location: 753858
Implantable Lead Location: 753859
Implantable Lead Location: 753860
Implantable Lead Model: 4598
Implantable Lead Model: 5076
Implantable Pulse Generator Implant Date: 20160409
Lead Channel Impedance Value: 1121 Ohm
Lead Channel Impedance Value: 1178 Ohm
Lead Channel Impedance Value: 1197 Ohm
Lead Channel Impedance Value: 1292 Ohm
Lead Channel Impedance Value: 1368 Ohm
Lead Channel Impedance Value: 399 Ohm
Lead Channel Impedance Value: 418 Ohm
Lead Channel Impedance Value: 475 Ohm
Lead Channel Impedance Value: 513 Ohm
Lead Channel Impedance Value: 608 Ohm
Lead Channel Impedance Value: 817 Ohm
Lead Channel Impedance Value: 817 Ohm
Lead Channel Impedance Value: 950 Ohm
Lead Channel Pacing Threshold Amplitude: 0.5 V
Lead Channel Pacing Threshold Amplitude: 0.5 V
Lead Channel Pacing Threshold Amplitude: 2 V
Lead Channel Pacing Threshold Pulse Width: 0.4 ms
Lead Channel Pacing Threshold Pulse Width: 0.4 ms
Lead Channel Pacing Threshold Pulse Width: 0.8 ms
Lead Channel Sensing Intrinsic Amplitude: 1 mV
Lead Channel Sensing Intrinsic Amplitude: 1 mV
Lead Channel Sensing Intrinsic Amplitude: 9.625 mV
Lead Channel Sensing Intrinsic Amplitude: 9.625 mV
Lead Channel Setting Pacing Amplitude: 1.5 V
Lead Channel Setting Pacing Amplitude: 2 V
Lead Channel Setting Pacing Amplitude: 2.5 V
Lead Channel Setting Pacing Pulse Width: 0.4 ms
Lead Channel Setting Pacing Pulse Width: 0.8 ms
Lead Channel Setting Sensing Sensitivity: 0.45 mV

## 2019-10-10 ENCOUNTER — Ambulatory Visit: Payer: Medicare PPO | Admitting: Podiatry

## 2019-10-10 ENCOUNTER — Other Ambulatory Visit: Payer: Self-pay

## 2019-10-10 ENCOUNTER — Encounter: Payer: Self-pay | Admitting: Podiatry

## 2019-10-10 DIAGNOSIS — E119 Type 2 diabetes mellitus without complications: Secondary | ICD-10-CM

## 2019-10-10 DIAGNOSIS — B351 Tinea unguium: Secondary | ICD-10-CM

## 2019-10-10 DIAGNOSIS — M79674 Pain in right toe(s): Secondary | ICD-10-CM | POA: Diagnosis not present

## 2019-10-10 NOTE — Progress Notes (Signed)
This patient presents the office with chief complaint of pain  big toe right foot.  She says this is been painful for the last month especially wearing shoes.  .  Patient states that she has provided no self treatment for this condition.  She presents the office for an evaluation and treatment of this painful thick ingrowing nail.  Patient is diabetic.  Vascular  Dorsalis pedis  are palpable  B/L. Posterior tibial pulses are absent. Capillary return  WNL.  Temperature gradient is  WNL.  Skin turgor  WNL  Sensorium  Senn Weinstein monofilament wire  WNL. Normal tactile sensation.  Nail Exam  Patient has long thick painful nails  B/L.Marland Kitchen Her right hallux nail is thickened and ingrown B/L.  Orthopedic  Exam  Muscle tone and muscle strength  WNL.  No limitations of motion feet  B/L.  No crepitus or joint effusion noted.  Foot type is unremarkable and digits show no abnormalities.  Bony prominences are unremarkable. Pes planus  TNJ DJD  B/L.  DJD 1st MPJ  B/L.  Skin  No open lesions.  Normal skin texture and turgor.  Ingrown Nail  Onychomycosis right hallux nail.  Debride nail right hallux  RTC 10 weeks.   Gardiner Barefoot DPM

## 2019-10-28 ENCOUNTER — Ambulatory Visit: Payer: Self-pay | Admitting: Family Medicine

## 2019-10-28 NOTE — Telephone Encounter (Signed)
Pt reports had friend in her home Sunday 10/26/2019. States friend became sick Monday, tested positive for covid. Individual was in home for 1 hour, direct contact. Pt questioning if she should be tested. Pt asymptomatic presently. Advised testing, pt high risk. Information provided regarding appt scheduling and process. Pt verbalizes understanding.  Reason for Disposition . General information question, no triage required and triager able to answer question  Answer Assessment - Initial Assessment Questions 1. REASON FOR CALL or QUESTION: "What is your reason for calling today?" or "How can I best help you?" or "What question do you have that I can help answer?"   Covid testing question  Protocols used: INFORMATION ONLY CALL-A-AH

## 2019-10-29 ENCOUNTER — Ambulatory Visit: Payer: Medicare PPO | Attending: Internal Medicine

## 2019-10-29 DIAGNOSIS — Z20828 Contact with and (suspected) exposure to other viral communicable diseases: Secondary | ICD-10-CM | POA: Diagnosis not present

## 2019-10-29 DIAGNOSIS — Z20822 Contact with and (suspected) exposure to covid-19: Secondary | ICD-10-CM

## 2019-10-30 NOTE — Progress Notes (Signed)
ICD remote 

## 2019-10-31 LAB — NOVEL CORONAVIRUS, NAA: SARS-CoV-2, NAA: NOT DETECTED

## 2019-12-06 ENCOUNTER — Other Ambulatory Visit: Payer: Self-pay | Admitting: Family Medicine

## 2019-12-06 DIAGNOSIS — E119 Type 2 diabetes mellitus without complications: Secondary | ICD-10-CM

## 2019-12-13 ENCOUNTER — Ambulatory Visit: Payer: Medicare HMO | Attending: Internal Medicine

## 2019-12-13 DIAGNOSIS — Z23 Encounter for immunization: Secondary | ICD-10-CM

## 2019-12-13 NOTE — Progress Notes (Signed)
   Covid-19 Vaccination Clinic  Name:  Bethany [name]    MRN: [id number] DOB: 10-19-1946  12/13/2019  [name] was observed post Covid-19 immunization for 15 minutes without incidence. She was provided with Vaccine Information Sheet and instruction to access the V-Safe system.   [name] was instructed to call 911 with any severe reactions post vaccine: Marland Kitchen Difficulty breathing  . Swelling of your face and throat  . A fast heartbeat  . A bad rash all over your body  . Dizziness and weakness    Immunizations Administered    Name Date Dose VIS Date Route   Pfizer COVID-19 Vaccine 12/13/2019  8:25 AM 0.3 mL 10/18/2019 Intramuscular   Manufacturer: [employer], [employer]   Lot: [id number]   NDC: [address]-3

## 2019-12-14 ENCOUNTER — Ambulatory Visit: Payer: Medicare PPO

## 2019-12-16 ENCOUNTER — Emergency Department: Payer: Medicare HMO

## 2019-12-16 ENCOUNTER — Other Ambulatory Visit: Payer: Self-pay | Admitting: Family Medicine

## 2019-12-16 ENCOUNTER — Emergency Department
Admission: EM | Admit: 2019-12-16 | Discharge: 2019-12-16 | Disposition: A | Discharge status: Home / Self Care | Payer: Medicare HMO | Attending: Emergency Medicine | Admitting: Emergency Medicine

## 2019-12-16 ENCOUNTER — Other Ambulatory Visit: Payer: Self-pay

## 2019-12-16 DIAGNOSIS — Z95810 Presence of automatic (implantable) cardiac defibrillator: Secondary | ICD-10-CM | POA: Insufficient documentation

## 2019-12-16 DIAGNOSIS — R0789 Other chest pain: Secondary | ICD-10-CM | POA: Insufficient documentation

## 2019-12-16 DIAGNOSIS — Z7984 Long term (current) use of oral hypoglycemic drugs: Secondary | ICD-10-CM | POA: Insufficient documentation

## 2019-12-16 DIAGNOSIS — R079 Chest pain, unspecified: Secondary | ICD-10-CM

## 2019-12-16 DIAGNOSIS — E039 Hypothyroidism, unspecified: Secondary | ICD-10-CM | POA: Diagnosis not present

## 2019-12-16 DIAGNOSIS — I5032 Chronic diastolic (congestive) heart failure: Secondary | ICD-10-CM | POA: Insufficient documentation

## 2019-12-16 DIAGNOSIS — Z79899 Other long term (current) drug therapy: Secondary | ICD-10-CM | POA: Diagnosis not present

## 2019-12-16 DIAGNOSIS — I110 Hypertensive heart disease with heart failure: Secondary | ICD-10-CM | POA: Diagnosis not present

## 2019-12-16 DIAGNOSIS — R001 Bradycardia, unspecified: Secondary | ICD-10-CM | POA: Diagnosis not present

## 2019-12-16 DIAGNOSIS — E119 Type 2 diabetes mellitus without complications: Secondary | ICD-10-CM | POA: Diagnosis not present

## 2019-12-16 LAB — BASIC METABOLIC PANEL
Anion gap: 8 (ref 5–15)
BUN: 9 mg/dL (ref 8–23)
CO2: 27 mmol/L (ref 22–32)
Calcium: 9.3 mg/dL (ref 8.9–10.3)
Chloride: 103 mmol/L (ref 98–111)
Creatinine, Ser: 0.64 mg/dL (ref 0.44–1.00)
GFR calc Af Amer: >60 mL/min (ref >60)
GFR calc non Af Amer: >60 mL/min (ref >60)
Glucose, Bld: 149 mg/dL — ABNORMAL HIGH (ref 70–99)
Potassium: 3.2 mmol/L — ABNORMAL LOW (ref 3.5–5.1)
Sodium: 138 mmol/L (ref 135–145)

## 2019-12-16 LAB — CBC WITH DIFFERENTIAL/PLATELET
Abs Immature Granulocytes: 0.01 10*3/uL (ref 0.00–0.07)
Basophils Absolute: 0 10*3/uL (ref 0.0–0.1)
Basophils Relative: 0 %
Eosinophils Absolute: 0.1 10*3/uL (ref 0.0–0.5)
Eosinophils Relative: 2 %
HCT: 34.4 % — ABNORMAL LOW (ref 36.0–46.0)
Hemoglobin: 11.1 g/dL — ABNORMAL LOW (ref 12.0–15.0)
Immature Granulocytes: 0 %
Lymphocytes Relative: 38 %
Lymphs Abs: 1.4 10*3/uL (ref 0.7–4.0)
MCH: 28.8 pg (ref 26.0–34.0)
MCHC: 32.3 g/dL (ref 30.0–36.0)
MCV: 89.4 fL (ref 80.0–100.0)
Monocytes Absolute: 0.4 10*3/uL (ref 0.1–1.0)
Monocytes Relative: 10 %
Neutro Abs: 1.9 10*3/uL (ref 1.7–7.7)
Neutrophils Relative %: 50 %
Platelets: 94 10*3/uL — ABNORMAL LOW (ref 150–400)
RBC: 3.85 MIL/uL — ABNORMAL LOW (ref 3.87–5.11)
RDW: 12.6 % (ref 11.5–15.5)
WBC: 3.8 10*3/uL — ABNORMAL LOW (ref 4.0–10.5)
nRBC: 0 % (ref 0.0–0.2)

## 2019-12-16 LAB — TROPONIN I (HIGH SENSITIVITY)
Troponin I (High Sensitivity): 5 ng/L (ref <18)
Troponin I (High Sensitivity): 6 ng/L (ref <18)

## 2019-12-16 MED ORDER — NITROGLYCERIN 0.4 MG SL SUBL
0.4000 mg | SUBLINGUAL_TABLET | SUBLINGUAL | Status: DC | PRN
  Filled 2019-12-16: qty 1

## 2019-12-16 NOTE — ED Provider Notes (Signed)
[facility] Emergency Department Provider Note   ____________________________________________   First MD Initiated Contact with Patient 12/16/19 1322     (approximate)  I have reviewed the triage vital signs and the nursing notes.   HISTORY  Chief Complaint Chest Pain    HPI Bethany [name] is a 74 y.o. female with past medical history of nonischemic cardiomyopathy, systolic CHF, SVT, hypertension, diabetes, and GERD who presents to the ED complaining of chest pain.  Patient reports she had onset of central chest pain around 9 AM while she was sitting in her chair.  Pain has been present intermittently since then, described as an ache similar to a muscle spasm when present.  She currently has relatively mild pain, rated as 3 out of 10.  She denies any recent fevers, cough, shortness of breath, leg swelling or pain.  She states she has had similar episodes of pain in the past, but they usually go away on their own.  She did not take any medications for this prior to arrival.        Past Medical History:  Diagnosis Date  . AICD (automatic cardioverter/defibrillator) present   . Anemia   . Anxiety   . CHF (congestive heart failure) ([place])   . Complication of anesthesia   . Depression   . Diabetes ([place])   . Dilated cardiomyopathy ([place])   . Diverticulosis   . Dyspnea   . Dysrhythmia   . Edema    FEET/ANKLES OCCAS  . GERD (gastroesophageal reflux disease)   . Hyperlipidemia   . Hypothyroidism   . LV dysfunction   . Orthopnea    USES 3 PILLOWS  . PONV (postoperative nausea and vomiting)   . Reflux   . Thrombocytopenia ([place])   . Thyroid disease   . VHD (valvular heart disease)     Patient Active Problem List   Diagnosis Date Noted  . Other neutropenia ([place]) 06/11/2019  . Thrombocytopenia ([place]) 06/11/2019  . Pain due to onychomycosis of toenail of right foot 05/30/2019  . Memory loss 07/12/2016  . GERD (gastroesophageal reflux disease)  08/24/2015  . Automatic implantable cardioverter-defibrillator in situ 07/29/2015  . Hypotension 06/18/2015  . Benign essential HTN 03/20/2015  . Absolute anemia 03/11/2015  . Abnormal liver enzymes 03/11/2015  . Decreased potassium in the blood 03/11/2015  . Hypercholesteremia 03/11/2015  . Adult hypothyroidism 03/11/2015  . Idiopathic insomnia 03/11/2015  . OP (osteoporosis) 03/11/2015  . Diabetes mellitus, type 2 ([place]) 03/11/2015  . Vitamin D deficiency 03/11/2015  . LBBB (left bundle branch block) 02/11/2015  . Paroxysmal supraventricular tachycardia ([place]) 11/25/2014  . Chronic systolic heart failure ([place]) 09/25/2014  . Heart valve disease 04/10/2014    Past Surgical History:  Procedure Laterality Date  . ABDOMINAL HYSTERECTOMY    . BI-VENTRICULAR IMPLANTABLE CARDIOVERTER DEFIBRILLATOR [name] 02/11/2015   Procedure: BI-VENTRICULAR IMPLANTABLE CARDIOVERTER DEFIBRILLATOR  (CRT-D);  Surgeon: Deboraha Sprang, MD;  Location: [facility] CATH LAB;  Service: Cardiovascular;  Laterality: [name];  . BONE MARROW BIOPSY  2007   spine  . CARDIAC CATHETERIZATION    . CATARACT EXTRACTION W/PHACO Right 01/03/2017   Procedure: CATARACT EXTRACTION PHACO AND INTRAOCULAR LENS PLACEMENT (IOC);  Surgeon: Leandrew Koyanagi, MD;  Location: [facility] ORS;  Service: Ophthalmology;  Laterality: Right;  [place] 00:50 AP% 19.5 CDE 9.94 fluid pack lot # [id number]  . CATARACT EXTRACTION W/PHACO Left 10/27/2016   Procedure: CATARACT EXTRACTION PHACO AND INTRAOCULAR LENS PLACEMENT (IOC);  Surgeon: Leandrew Koyanagi, MD;  Location: [facility]  ORS;  Service: Ophthalmology;  Laterality: Left;  [place]  01:12 AP%  21.0 CDE  11.59 Fluid pack lot # [id number]  . CHOLECYSTECTOMY    . INSERT / REPLACE / REMOVE PACEMAKER     AICD  . LEAD REVISION [name] 02/11/2015   Procedure: LEAD REVISION;  Surgeon: Deboraha Sprang, MD;  Location: [facility] CATH LAB;  Service: Cardiovascular;  Laterality: [name];  . PARTIAL HYSTERECTOMY  1977  . TOOTH EXTRACTION  09/23/2015     Prior to Admission medications   Medication Sig Start Date End Date Taking? Authorizing Provider  acetaminophen (TYLENOL) 500 MG tablet Take 1,000 mg by mouth 2 (two) times daily as needed for mild pain or moderate pain.    [provider]  Alcohol Swabs (B-D SINGLE USE SWABS REGULAR) PADS USE TO CHECK BLOOD SUGAR DAILY 09/19/19   Birdie Sons, MD  aspirin EC 81 MG tablet Take 81 mg by mouth daily.    [provider]  atorvastatin (LIPITOR) 20 MG tablet TAKE 1 TABLET EVERY DAY 12/06/19   Birdie Sons, MD  carvedilol (COREG) 3.125 MG tablet TAKE 1/2 TABLET BY MOUTH TWICE DAILY (DOSE CHANGE) 05/28/19   Birdie Sons, MD  ferrous sulfate 325 (65 FE) MG tablet Take 325 mg by mouth 2 (two) times daily with a meal.    [provider]  furosemide (LASIX) 40 MG tablet Take 20 mg by mouth daily.     [provider]  glipiZIDE (GLUCOTROL XL) 2.5 MG 24 hr tablet TAKE 1 TABLET EVERY DAY WITH BREAKFAST 09/05/19   Birdie Sons, MD  glucose blood (TRUE METRIX BLOOD GLUCOSE TEST) test strip CHECK BLOOD SUGAR EVERY DAY 08/22/19   Birdie Sons, MD  JANUMET XR [phone] MG TB24 TAKE 1 TABLET DAILY. REPLACES JANUVIA AND METFORMIN 12/16/19   Birdie Sons, MD  pantoprazole (PROTONIX) 40 MG tablet Take 1 tablet (40 mg total) by mouth daily. 09/02/19   Birdie Sons, MD  sacubitril-valsartan (ENTRESTO) 24-26 MG Take 0.5 tablets by mouth 2 (two) times daily. 04/10/18   Deboraha Sprang, MD  senna (SENOKOT) 8.6 MG TABS tablet Take 1 tablet by mouth daily as needed for mild constipation.    [provider]  SYNTHROID 50 MCG tablet TAKE 1 TABLET EVERY DAY 07/26/19   Birdie Sons, MD  TRUEplus Lancets 33G MISC CHECK BLOOD SUGAR ONE TIME A DAY 06/12/19   Birdie Sons, MD  Vitamin D, Ergocalciferol, (DRISDOL) 1.25 MG (50000 UT) CAPS capsule Take 1 capsule (50,000 Units total) by mouth every 7 (seven) days. 04/25/19   Birdie Sons, MD     Allergies Other and Sulfa antibiotics  Family History  Problem Relation Age of Onset  . Heart disease Mother   . Heart attack Mother   . Prostate cancer Father   . Ulcers Father   . Stroke Sister   . Heart attack Sister   . Diabetes Sister   . Heart attack Brother   . Stroke Brother   . Breast cancer Neg Hx     Social History Social History   Tobacco Use  . Smoking status: Never Smoker  . Smokeless tobacco: Former Systems developer    Types: Snuff  Substance Use Topics  . Alcohol use: No    Alcohol/week: 0.0 standard drinks  . Drug use: No    Review of Systems  Constitutional: No fever/chills Eyes: No visual changes. ENT: No sore throat. Cardiovascular: Positive for chest pain. Respiratory:  Denies shortness of breath. Gastrointestinal: No abdominal pain.  No nausea, no vomiting.  No diarrhea.  No constipation. Genitourinary: Negative for dysuria. Musculoskeletal: Negative for back pain. Skin: Negative for rash. Neurological: Negative for headaches, focal weakness or numbness.  ____________________________________________   PHYSICAL EXAM:  VITAL SIGNS: ED Triage Vitals  Enc Vitals Group     BP      Pulse      Resp      Temp      Temp src      SpO2      Weight      Height      Head Circumference      Peak Flow      Pain Score      Pain Loc      Pain Edu?      Excl. in [place]?     Constitutional: Alert and oriented. Eyes: Conjunctivae are normal. Head: Atraumatic. Nose: No congestion/rhinnorhea. Mouth/Throat: Mucous membranes are moist. Neck: Normal ROM Cardiovascular: Normal rate, regular rhythm. Grossly normal heart sounds. Respiratory: Normal respiratory effort.  No retractions. Lungs CTAB.  No chest wall tenderness to palpation. Gastrointestinal: Soft and nontender. No distention. Genitourinary: deferred Musculoskeletal: No lower extremity tenderness nor edema. Neurologic:  Normal speech and language. No gross focal neurologic deficits are  appreciated. Skin:  Skin is warm, dry and intact. No rash noted. Psychiatric: Mood and affect are normal. Speech and behavior are normal.  ____________________________________________   LABS (all labs ordered are listed, but only abnormal results are displayed)  Labs Reviewed  BASIC METABOLIC PANEL - Abnormal; Notable for the following components:      Result Value   Potassium 3.2 (*)    Glucose, Bld 149 (*)    All other components within normal limits  CBC WITH DIFFERENTIAL/PLATELET - Abnormal; Notable for the following components:   WBC 3.8 (*)    RBC 3.85 (*)    Hemoglobin 11.1 (*)    HCT 34.4 (*)    Platelets 94 (*)    All other components within normal limits  TROPONIN I (HIGH SENSITIVITY)  TROPONIN I (HIGH SENSITIVITY)   ____________________________________________  EKG  ED ECG REPORT I, Blake Divine, the attending physician, personally viewed and interpreted this ECG.   Date: 12/16/2019  EKG Time: 13:29  Rate: 78  Rhythm: normal sinus rhythm  Axis: LAD  Intervals:nonspecific intraventricular conduction delay  ST&T Change: None  PROCEDURES  Procedure(s) performed (including Critical Care):  Procedures   ____________________________________________   INITIAL IMPRESSION / ASSESSMENT AND PLAN / ED COURSE       74 year old female with history of nonischemic cardiomyopathy and systolic CHF presents to the ED with onset of chest ache this morning while at rest.  EKG without any acute ischemic changes and symptoms sound atypical, differential includes ACS, GERD, musculoskeletal, pneumonia, pneumothorax.  Doubt PE or dissection.  Will check 2 sets of troponin, labs, chest x-ray.  Will trial dose of nitroglycerin and also consider GI cocktail.  Patient had declined nitroglycerin as her pain had resolved.  2 sets troponin are negative and remainder of lab work reassuring, chest x-ray negative for acute process.  Case discussed with Dr. Christella Noa of cardiology, who  agrees with plan for discharge home and follow-up as an outpatient.  I have counseled patient to return to the ED for new or worsening symptoms, patient agrees with plan.      ____________________________________________   FINAL CLINICAL IMPRESSION(S) / ED DIAGNOSES  Final diagnoses:  Nonspecific chest  pain     ED Discharge Orders    None       Note:  This document was prepared using Dragon voice recognition software and may include unintentional dictation errors.   Blake Divine, MD 12/16/19 [phone]

## 2019-12-16 NOTE — ED Triage Notes (Signed)
Pt arrives via [facility] from home for chest pain that started while sitting in her chair at 9am this morning. States pain is in the left side of chest that radiates to the right side of chest, pt reports pain is worse when coughing. PT got her first covid vaccine on Friday, no known sick contacts. A&Ox4 and in NAD at this time

## 2019-12-19 ENCOUNTER — Ambulatory Visit: Payer: Medicare HMO | Admitting: Podiatry

## 2019-12-19 ENCOUNTER — Encounter: Payer: Self-pay | Admitting: Podiatry

## 2019-12-19 ENCOUNTER — Other Ambulatory Visit: Payer: Self-pay

## 2019-12-19 DIAGNOSIS — M79674 Pain in right toe(s): Secondary | ICD-10-CM | POA: Diagnosis not present

## 2019-12-19 DIAGNOSIS — B351 Tinea unguium: Secondary | ICD-10-CM | POA: Diagnosis not present

## 2019-12-19 DIAGNOSIS — E119 Type 2 diabetes mellitus without complications: Secondary | ICD-10-CM

## 2019-12-19 DIAGNOSIS — L600 Ingrowing nail: Secondary | ICD-10-CM

## 2019-12-19 NOTE — Progress Notes (Signed)
This patient presents the office with chief complaint of pain  big toe right foot.  She says this is been painful for the last month especially wearing shoes.  .  Patient states that she has provided no self treatment for this condition.  She presents the office for an evaluation and treatment of this painful thick ingrowing nail.  Patient is diabetic.  Vascular  Dorsalis pedis  are palpable  B/L. Posterior tibial pulses are absent. Capillary return  WNL.  Temperature gradient is  WNL.  Skin turgor  WNL  Sensorium  Senn Weinstein monofilament wire  WNL. Normal tactile sensation.  Nail Exam  Patient has long thick painful nails  B/L.Marland Kitchen Her right hallux nail is thickened and ingrown B/L.  Orthopedic  Exam  Muscle tone and muscle strength  WNL.  No limitations of motion feet  B/L.  No crepitus or joint effusion noted.  Foot type is unremarkable and digits show no abnormalities.  Bony prominences are unremarkable. Pes planus  TNJ DJD  B/L.  DJD 1st MPJ  B/L.  Skin  No open lesions.  Normal skin texture and turgor.  Ingrown Nail  Onychomycosis right hallux nail.  Debride nail right hallux  RTC 9 weeks.  Discussed possible nail surgery lateral border right hallux toenail in future.   Helane Gunther DPM

## 2019-12-20 ENCOUNTER — Encounter: Payer: Medicaid Other | Admitting: Family Medicine

## 2019-12-30 ENCOUNTER — Other Ambulatory Visit: Payer: Self-pay

## 2019-12-30 ENCOUNTER — Ambulatory Visit (INDEPENDENT_AMBULATORY_CARE_PROVIDER_SITE_OTHER): Payer: Medicare HMO | Admitting: Family Medicine

## 2019-12-30 ENCOUNTER — Encounter: Payer: Self-pay | Admitting: Family Medicine

## 2019-12-30 VITALS — BP 98/65 | HR 80 | Temp 97.1°F | Wt 132.4 lb

## 2019-12-30 DIAGNOSIS — Z1231 Encounter for screening mammogram for malignant neoplasm of breast: Secondary | ICD-10-CM

## 2019-12-30 DIAGNOSIS — I10 Essential (primary) hypertension: Secondary | ICD-10-CM

## 2019-12-30 DIAGNOSIS — E119 Type 2 diabetes mellitus without complications: Secondary | ICD-10-CM

## 2019-12-30 DIAGNOSIS — E039 Hypothyroidism, unspecified: Secondary | ICD-10-CM

## 2019-12-30 DIAGNOSIS — M810 Age-related osteoporosis without current pathological fracture: Secondary | ICD-10-CM

## 2019-12-30 DIAGNOSIS — E559 Vitamin D deficiency, unspecified: Secondary | ICD-10-CM | POA: Diagnosis not present

## 2019-12-30 LAB — POCT GLYCOSYLATED HEMOGLOBIN (HGB A1C)
Est. average glucose Bld gHb Est-mCnc: 148
Hemoglobin A1C: 6.8 % — AB (ref 4.0–5.6)

## 2019-12-30 NOTE — Patient Instructions (Addendum)
   Please call the [facility] at [facility] at [phone] to schedule your mammogram.

## 2019-12-30 NOTE — Progress Notes (Signed)
Patient: Bethany [name] Female    DOB: [birth year]   73 y.o.   MRN: [id number] Visit Date: 12/30/2019  Today's Provider: [name], MD   Chief Complaint  Patient presents with  . Diabetes  . Hypothyroidism   Subjective:     HPI  Diabetes Mellitus Type II, Follow-up:   Lab Results  Component Value Date   HGBA1C 7.2 (A) 08/19/2019   HGBA1C 7.3 (A) 03/13/2019   HGBA1C 7.4 (A) 12/04/2018    Last seen for diabetes 4 months ago.  Management since then includes no changes. She reports good compliance with treatment. She is not having side effects.  Current symptoms include none  Home blood sugar records: fasting range: 96-110  Episodes of hypoglycemia? yes - 1 or 2   Current insulin regiment: Is not on insulin Most Recent Eye Exam: 05/28/2019 Weight trend: stable Prior visit with dietician: No Current exercise: walking Current diet habits: well balanced  Pertinent Labs:    Component Value Date/Time   CHOL 115 04/04/2018 1502   CHOL 130 02/21/2014 0014   TRIG 100 04/04/2018 1502   TRIG 94 02/21/2014 0014   HDL 54 04/04/2018 1502   HDL 42 02/21/2014 0014   LDLCALC 41 04/04/2018 1502   LDLCALC 69 02/21/2014 0014   CREATININE 0.64 12/16/2019 1335   CREATININE 0.70 02/04/2015 1426    Wt Readings from Last 3 Encounters:  12/30/19 132 lb 6.4 oz (60.1 kg)  12/16/19 120 lb (54.4 kg)  09/03/19 132 lb (59.9 kg)    ------------------------------------------------------------------------  Follow up for Hypothyroidism:  The patient was last seen for this 4 months ago. Changes made at last visit include none.  She reports good compliance with treatment. She is not having side effects.   ------------------------------------------------------------------------------------  Allergies  Allergen Reactions  . Other Other (See Comments)    NO BLOOD PRODUCTS- JEHOVAHS WITNESS  . Sulfa Antibiotics Other (See Comments)    Loss of taste     Current  Outpatient Medications:  .  acetaminophen (TYLENOL) 500 MG tablet, Take 1,000 mg by mouth 2 (two) times daily as needed for mild pain or moderate pain., Disp: , Rfl:  .  Alcohol Swabs (B-D SINGLE USE SWABS REGULAR) PADS, USE TO CHECK BLOOD SUGAR DAILY, Disp: 100 each, Rfl: 4 .  aspirin EC 81 MG tablet, Take 81 mg by mouth daily., Disp: , Rfl:  .  atorvastatin (LIPITOR) 20 MG tablet, TAKE 1 TABLET EVERY DAY, Disp: 90 tablet, Rfl: 0 .  carvedilol (COREG) 3.125 MG tablet, TAKE 1/2 TABLET BY MOUTH TWICE DAILY (DOSE CHANGE), Disp: 90 tablet, Rfl: 4 .  chlorhexidine (PERIDEX) 0.12 % solution, 15 mLs 2 (two) times daily., Disp: , Rfl:  .  ferrous sulfate 325 (65 FE) MG tablet, Take 325 mg by mouth 2 (two) times daily with a meal., Disp: , Rfl:  .  furosemide (LASIX) 40 MG tablet, Take 20 mg by mouth daily. , Disp: , Rfl:  .  glipiZIDE (GLUCOTROL XL) 2.5 MG 24 hr tablet, TAKE 1 TABLET EVERY DAY WITH BREAKFAST, Disp: 90 tablet, Rfl: 4 .  glucose blood (TRUE METRIX BLOOD GLUCOSE TEST) test strip, CHECK BLOOD SUGAR EVERY DAY, Disp: 100 each, Rfl: 3 .  JANUMET XR [phone] MG TB24, TAKE 1 TABLET DAILY. REPLACES JANUVIA AND METFORMIN, Disp: 90 tablet, Rfl: 0 .  pantoprazole (PROTONIX) 40 MG tablet, Take 1 tablet (40 mg total) by mouth daily., Disp: 90 tablet, Rfl: 3 .  sacubitril-valsartan (  ENTRESTO) 24-26 MG, TAKE 1/2 TABLET EVERY 12 HOURS (TWICE DAILY), Disp: , Rfl:  .  senna (SENOKOT) 8.6 MG TABS tablet, Take 1 tablet by mouth daily as needed for mild constipation., Disp: , Rfl:  .  SYNTHROID 50 MCG tablet, TAKE 1 TABLET EVERY DAY, Disp: 90 tablet, Rfl: 4 .  TRUEplus Lancets 33G MISC, CHECK BLOOD SUGAR ONE TIME A DAY, Disp: 100 each, Rfl: 4 .  Vitamin D, Ergocalciferol, (DRISDOL) 1.25 MG (50000 UT) CAPS capsule, Take 1 capsule (50,000 Units total) by mouth every 7 (seven) days., Disp: 12 capsule, Rfl: 3  Review of Systems  Constitutional: Negative for appetite change, chills, fatigue and fever.    Respiratory: Negative for chest tightness and shortness of breath.   Cardiovascular: Negative for chest pain and palpitations.  Gastrointestinal: Negative for abdominal pain, nausea and vomiting.  Neurological: Negative for dizziness and weakness.    Social History   Tobacco Use  . Smoking status: Never Smoker  . Smokeless tobacco: Former Systems developer    Types: Snuff  Substance Use Topics  . Alcohol use: No    Alcohol/week: 0.0 standard drinks      Objective:   BP 98/65 (BP Location: Right Arm, Patient Position: Sitting, Cuff Size: Normal)   Pulse 80   Temp (!) 97.1 F (36.2 C) (Temporal)   Wt 132 lb 6.4 oz (60.1 kg)   LMP  (LMP Unknown)   BMI 23.45 kg/m  Vitals:   12/30/19 1451  BP: 98/65  Pulse: 80  Temp: (!) 97.1 F (36.2 C)  TempSrc: Temporal  Weight: 132 lb 6.4 oz (60.1 kg)  Body mass index is 23.45 kg/m.   Physical Exam  General appearance: Well developed, well nourished female, cooperative and in no acute distress Head: Normocephalic, without obvious abnormality, atraumatic Respiratory: Respirations even and unlabored, normal respiratory rate Extremities: All extremities are intact.  Skin: Skin color, texture, turgor normal. No rashes seen  Psych: Appropriate mood and affect. Neurologic: Mental status: Alert, oriented to person, place, and time, thought content appropriate.   Results for orders placed or performed in visit on 12/30/19  POCT HgB A1C  Result Value Ref Range   Hemoglobin A1C 6.8 (A) 4.0 - 5.6 %   Est. average glucose Bld gHb Est-mCnc 148        Assessment & Plan    1. Type 2 diabetes mellitus without complication, without long-term current use of insulin (HCC) Well controlled.  Continue current medications.   Follow up to check a1c in 4 months.  - Lipid panel  2. Age-related osteoporosis without current pathological fracture Due for - DG Bone Density; Future  3. Benign essential HTN Well controlled.  Continue current medications.     4. Vitamin D deficiency  - VITAMIN D 25 Hydroxy (Vit-D Deficiency, Fractures)  5. Adult hypothyroidism  - TSH  6. Breast cancer screening by mammogram  - MM 3D Screening Breast Bilateral - [place]; Future     Lelon Huh, MD  [facility]

## 2019-12-31 LAB — LIPID PANEL
Chol/HDL Ratio: 1.9 ratio (ref 0.0–4.4)
Cholesterol, Total: 109 mg/dL (ref 100–199)
HDL: 58 mg/dL (ref >39)
LDL Chol Calc (NIH): 34 mg/dL (ref 0–99)
Triglycerides: 89 mg/dL (ref 0–149)
VLDL Cholesterol Cal: 17 mg/dL (ref 5–40)

## 2020-01-02 ENCOUNTER — Telehealth: Payer: Self-pay

## 2020-01-02 NOTE — Telephone Encounter (Signed)
-----   Message from Malva Limes, MD sent at 01/01/2020 12:13 PM EST ----- I ordered lipids, TSH and vitamin d levels on this patient 2 days ago, but only lipids have come back. Please check with [facility] to see if they are going to do TSH and vitamin D or not. Thanks.

## 2020-01-02 NOTE — Telephone Encounter (Signed)
Per Angelique Blonder it appears only lipid panel was drawn. Faxed a lab add on form to [facility] to add TSH and Vitamin D.

## 2020-01-03 LAB — VITAMIN D 25 HYDROXY (VIT D DEFICIENCY, FRACTURES): Vit D, 25-Hydroxy: 83.4 ng/mL (ref 30.0–100.0)

## 2020-01-03 LAB — TSH: TSH: 1.07 u[IU]/mL (ref 0.450–4.500)

## 2020-01-03 LAB — SPECIMEN STATUS REPORT

## 2020-01-05 ENCOUNTER — Other Ambulatory Visit: Payer: Self-pay | Admitting: Family Medicine

## 2020-01-05 DIAGNOSIS — E559 Vitamin D deficiency, unspecified: Secondary | ICD-10-CM

## 2020-01-05 MED ORDER — VITAMIN D 125 MCG (5000 UT) PO CAPS: 5000.0000 mg | ORAL_CAPSULE | ORAL | Status: DC

## 2020-01-06 ENCOUNTER — Ambulatory Visit (INDEPENDENT_AMBULATORY_CARE_PROVIDER_SITE_OTHER): Payer: Medicare HMO | Admitting: *Deleted

## 2020-01-06 ENCOUNTER — Telehealth: Payer: Self-pay

## 2020-01-06 DIAGNOSIS — E119 Type 2 diabetes mellitus without complications: Secondary | ICD-10-CM

## 2020-01-06 DIAGNOSIS — Z95810 Presence of automatic (implantable) cardiac defibrillator: Secondary | ICD-10-CM

## 2020-01-06 LAB — CUP PACEART REMOTE DEVICE CHECK
Battery Remaining Longevity: 21 mo
Battery Voltage: 2.91 V
Brady Statistic AP VP Percent: 0.02 %
Brady Statistic AP VS Percent: 0.01 %
Brady Statistic AS VP Percent: 98.73 %
Brady Statistic AS VS Percent: 1.24 %
Brady Statistic RA Percent Paced: 0.04 %
Brady Statistic RV Percent Paced: 4.36 %
Date Time Interrogation Session: 20210301022702
HighPow Impedance: 56 Ohm
Implantable Lead Implant Date: 20160409
Implantable Lead Implant Date: 20160409
Implantable Lead Implant Date: 20160409
Implantable Lead Location: 753858
Implantable Lead Location: 753859
Implantable Lead Location: 753860
Implantable Lead Model: 4598
Implantable Lead Model: 5076
Implantable Pulse Generator Implant Date: 20160409
Lead Channel Impedance Value: 1026 Ohm
Lead Channel Impedance Value: 1197 Ohm
Lead Channel Impedance Value: 1254 Ohm
Lead Channel Impedance Value: 1349 Ohm
Lead Channel Impedance Value: 1425 Ohm
Lead Channel Impedance Value: 1463 Ohm
Lead Channel Impedance Value: 399 Ohm
Lead Channel Impedance Value: 399 Ohm
Lead Channel Impedance Value: 475 Ohm
Lead Channel Impedance Value: 475 Ohm
Lead Channel Impedance Value: 646 Ohm
Lead Channel Impedance Value: 874 Ohm
Lead Channel Impedance Value: 931 Ohm
Lead Channel Pacing Threshold Amplitude: 0.375 V
Lead Channel Pacing Threshold Amplitude: 0.625 V
Lead Channel Pacing Threshold Amplitude: 2 V
Lead Channel Pacing Threshold Pulse Width: 0.4 ms
Lead Channel Pacing Threshold Pulse Width: 0.4 ms
Lead Channel Pacing Threshold Pulse Width: 0.8 ms
Lead Channel Sensing Intrinsic Amplitude: 0.625 mV
Lead Channel Sensing Intrinsic Amplitude: 0.625 mV
Lead Channel Sensing Intrinsic Amplitude: 10.375 mV
Lead Channel Sensing Intrinsic Amplitude: 10.375 mV
Lead Channel Setting Pacing Amplitude: 1.5 V
Lead Channel Setting Pacing Amplitude: 2 V
Lead Channel Setting Pacing Amplitude: 2.5 V
Lead Channel Setting Pacing Pulse Width: 0.4 ms
Lead Channel Setting Pacing Pulse Width: 0.8 ms
Lead Channel Setting Sensing Sensitivity: 0.45 mV

## 2020-01-06 MED ORDER — TRUE METRIX BLOOD GLUCOSE TEST VI STRP: ORAL_STRIP | 3 refills | Status: DC

## 2020-01-06 NOTE — Telephone Encounter (Signed)
-----   Message from Malva Limes, MD sent at 01/05/2020  8:26 AM EST ----- Labs are very good. She can reduce dose of vitamin d to 5,000 units once a week, (she is currently taking 50,000 once a week) continue all other medications unchanged.

## 2020-01-06 NOTE — Telephone Encounter (Signed)
Patient advised.

## 2020-01-07 ENCOUNTER — Ambulatory Visit: Payer: Medicare HMO | Attending: Internal Medicine

## 2020-01-07 DIAGNOSIS — Z23 Encounter for immunization: Secondary | ICD-10-CM | POA: Insufficient documentation

## 2020-01-07 NOTE — Progress Notes (Signed)
ICD Remote  

## 2020-01-07 NOTE — Progress Notes (Signed)
   Covid-19 Vaccination Clinic  Name:  Bethany [name]    MRN: [id number] DOB: 07-02-1946  01/07/2020  [name] was observed post Covid-19 immunization for 15 minutes without incident. She was provided with Vaccine Information Sheet and instruction to access the V-Safe system.   [name] was instructed to call 911 with any severe reactions post vaccine: Marland Kitchen Difficulty breathing  . Swelling of face and throat  . A fast heartbeat  . A bad rash all over body  . Dizziness and weakness   Immunizations Administered    Name Date Dose VIS Date Route   Pfizer COVID-19 Vaccine 01/07/2020  8:32 AM 0.3 mL 10/18/2019 Intramuscular   Manufacturer: [employer], [employer]   Lot: [id number]   NDC: [address]-9

## 2020-01-21 DIAGNOSIS — I447 Left bundle-branch block, unspecified: Secondary | ICD-10-CM | POA: Diagnosis not present

## 2020-01-21 DIAGNOSIS — I10 Essential (primary) hypertension: Secondary | ICD-10-CM | POA: Diagnosis not present

## 2020-01-21 DIAGNOSIS — I208 Other forms of angina pectoris: Secondary | ICD-10-CM | POA: Diagnosis not present

## 2020-01-21 DIAGNOSIS — E782 Mixed hyperlipidemia: Secondary | ICD-10-CM | POA: Diagnosis not present

## 2020-01-21 DIAGNOSIS — I5022 Chronic systolic (congestive) heart failure: Secondary | ICD-10-CM | POA: Diagnosis not present

## 2020-01-21 DIAGNOSIS — Z95810 Presence of automatic (implantable) cardiac defibrillator: Secondary | ICD-10-CM | POA: Diagnosis not present

## 2020-02-05 ENCOUNTER — Ambulatory Visit
Admission: RE | Admit: 2020-02-05 | Discharge: 2020-02-05 | Disposition: A | Discharge status: Home / Self Care | Payer: Medicare HMO | Source: Ambulatory Visit | Attending: Family Medicine | Admitting: Family Medicine

## 2020-02-05 DIAGNOSIS — M810 Age-related osteoporosis without current pathological fracture: Secondary | ICD-10-CM | POA: Diagnosis not present

## 2020-02-05 DIAGNOSIS — M8589 Other specified disorders of bone density and structure, multiple sites: Secondary | ICD-10-CM | POA: Diagnosis not present

## 2020-02-06 ENCOUNTER — Other Ambulatory Visit: Payer: Self-pay | Admitting: Family Medicine

## 2020-02-13 ENCOUNTER — Telehealth: Payer: Self-pay

## 2020-02-13 NOTE — Telephone Encounter (Signed)
Copied from CRM [id number]. Topic: General - Other >> Feb 13, 2020 10:19 AM Wyonia Hough E wrote: Reason for CRM: Pt asked to speak with nurse or Dr about coming in to get her shingles shot/ Please advise if this is ok

## 2020-02-14 ENCOUNTER — Other Ambulatory Visit: Payer: Self-pay | Admitting: Family Medicine

## 2020-02-14 DIAGNOSIS — E119 Type 2 diabetes mellitus without complications: Secondary | ICD-10-CM

## 2020-02-14 NOTE — Telephone Encounter (Signed)
Patient advised to contact Medicare to see about coverage for shingles vaccine. Patient agreed.

## 2020-02-19 ENCOUNTER — Other Ambulatory Visit: Payer: Self-pay | Admitting: Family Medicine

## 2020-02-20 ENCOUNTER — Other Ambulatory Visit: Payer: Self-pay

## 2020-02-20 ENCOUNTER — Ambulatory Visit: Payer: Medicare HMO | Admitting: Podiatry

## 2020-02-20 ENCOUNTER — Encounter: Payer: Self-pay | Admitting: Podiatry

## 2020-02-20 VITALS — Temp 98.1°F

## 2020-02-20 DIAGNOSIS — E119 Type 2 diabetes mellitus without complications: Secondary | ICD-10-CM

## 2020-02-20 DIAGNOSIS — M79674 Pain in right toe(s): Secondary | ICD-10-CM | POA: Diagnosis not present

## 2020-02-20 DIAGNOSIS — B351 Tinea unguium: Secondary | ICD-10-CM | POA: Diagnosis not present

## 2020-02-20 DIAGNOSIS — I208 Other forms of angina pectoris: Secondary | ICD-10-CM | POA: Diagnosis not present

## 2020-02-20 NOTE — Progress Notes (Signed)
This patient returns to my office for at risk foot care.  This patient requires this care by a professional since this patient will be at risk due to having type 2 diabetes.  This patient is unable to cut nails herself since the patient cannot reach her nails.These nails are painful walking and wearing shoes.  This patient presents for at risk foot care today.  General Appearance  Alert, conversant and in no acute stress.  Vascular  Dorsalis pedis  are palpable  Bilaterally. Posterior tibial pulses are absent  B/L. Capillary return is within normal limits  bilaterally. Temperature is within normal limits  bilaterally.  Neurologic  Senn-Weinstein monofilament wire test within normal limits  bilaterally. Muscle power within normal limits bilaterally.  Nails Thick disfigured discolored nails with subungual debris  Tight hallux.. No evidence of bacterial infection or drainage bilaterally.  Orthopedic  No limitations of motion  feet .  No crepitus or effusions noted.  No bony pathology or digital deformities noted.  Skin  normotropic skin with no porokeratosis noted bilaterally.  No signs of infections or ulcers noted.     Onychomycosis  Pain in right toes  Pain in left toes  Consent was obtained for treatment procedures.   Mechanical debridement of nail right hallux. performed with a nail nipper.  Filed with dremel without incident.    Return office visit   3 months                   Told patient to return for periodic foot care and evaluation due to potential at risk complications.   Helane Gunther DPM

## 2020-02-26 DIAGNOSIS — I10 Essential (primary) hypertension: Secondary | ICD-10-CM | POA: Diagnosis not present

## 2020-02-26 DIAGNOSIS — Z95810 Presence of automatic (implantable) cardiac defibrillator: Secondary | ICD-10-CM | POA: Diagnosis not present

## 2020-02-26 DIAGNOSIS — I447 Left bundle-branch block, unspecified: Secondary | ICD-10-CM | POA: Diagnosis not present

## 2020-02-26 DIAGNOSIS — E782 Mixed hyperlipidemia: Secondary | ICD-10-CM | POA: Diagnosis not present

## 2020-02-26 DIAGNOSIS — I5022 Chronic systolic (congestive) heart failure: Secondary | ICD-10-CM | POA: Diagnosis not present

## 2020-03-10 DIAGNOSIS — H40003 Preglaucoma, unspecified, bilateral: Secondary | ICD-10-CM | POA: Diagnosis not present

## 2020-03-10 LAB — HM DIABETES EYE EXAM

## 2020-04-08 ENCOUNTER — Ambulatory Visit (INDEPENDENT_AMBULATORY_CARE_PROVIDER_SITE_OTHER): Payer: Medicare HMO | Admitting: *Deleted

## 2020-04-08 DIAGNOSIS — I428 Other cardiomyopathies: Secondary | ICD-10-CM

## 2020-04-08 LAB — CUP PACEART REMOTE DEVICE CHECK
Battery Remaining Longevity: 18 mo
Battery Voltage: 2.9 V
Brady Statistic AP VP Percent: 0.02 %
Brady Statistic AP VS Percent: 0.01 %
Brady Statistic AS VP Percent: 98.74 %
Brady Statistic AS VS Percent: 1.23 %
Brady Statistic RA Percent Paced: 0.03 %
Brady Statistic RV Percent Paced: 4.93 %
Date Time Interrogation Session: 20210602022702
HighPow Impedance: 53 Ohm
Implantable Lead Implant Date: 20160409
Implantable Lead Implant Date: 20160409
Implantable Lead Implant Date: 20160409
Implantable Lead Location: 753858
Implantable Lead Location: 753859
Implantable Lead Location: 753860
Implantable Lead Model: 4598
Implantable Lead Model: 5076
Implantable Pulse Generator Implant Date: 20160409
Lead Channel Impedance Value: 1007 Ohm
Lead Channel Impedance Value: 1178 Ohm
Lead Channel Impedance Value: 1178 Ohm
Lead Channel Impedance Value: 1235 Ohm
Lead Channel Impedance Value: 1368 Ohm
Lead Channel Impedance Value: 1406 Ohm
Lead Channel Impedance Value: 342 Ohm
Lead Channel Impedance Value: 361 Ohm
Lead Channel Impedance Value: 475 Ohm
Lead Channel Impedance Value: 475 Ohm
Lead Channel Impedance Value: 665 Ohm
Lead Channel Impedance Value: 817 Ohm
Lead Channel Impedance Value: 836 Ohm
Lead Channel Pacing Threshold Amplitude: 0.375 V
Lead Channel Pacing Threshold Amplitude: 0.75 V
Lead Channel Pacing Threshold Amplitude: 2.125 V
Lead Channel Pacing Threshold Pulse Width: 0.4 ms
Lead Channel Pacing Threshold Pulse Width: 0.4 ms
Lead Channel Pacing Threshold Pulse Width: 0.8 ms
Lead Channel Sensing Intrinsic Amplitude: 0.75 mV
Lead Channel Sensing Intrinsic Amplitude: 0.75 mV
Lead Channel Sensing Intrinsic Amplitude: 9.875 mV
Lead Channel Sensing Intrinsic Amplitude: 9.875 mV
Lead Channel Setting Pacing Amplitude: 1.5 V
Lead Channel Setting Pacing Amplitude: 2 V
Lead Channel Setting Pacing Amplitude: 2.75 V
Lead Channel Setting Pacing Pulse Width: 0.4 ms
Lead Channel Setting Pacing Pulse Width: 0.8 ms
Lead Channel Setting Sensing Sensitivity: 0.45 mV

## 2020-04-08 NOTE — Progress Notes (Signed)
Subjective:   Bethany [name] is a 74 y.o. female who presents for Medicare Annual (Subsequent) preventive examination.  I connected with Laverne Hursey today by telephone and verified that I am speaking with the correct person using two identifiers. Location patient: home Location provider: work Persons participating in the virtual visit: patient, provider.   I discussed the limitations, risks, security and privacy concerns of performing an evaluation and management service by telephone and the availability of in person appointments. I also discussed with the patient that there may be a patient responsible charge related to this service. The patient expressed understanding and verbally consented to this telephonic visit.    Interactive audio and video telecommunications were attempted between this provider and patient, however failed, due to patient having technical difficulties OR patient did not have access to video capability.  We continued and completed visit with audio only.   Review of Systems:  [name]  Cardiac Risk Factors include: advanced age (>50mn, >>9women);diabetes mellitus;dyslipidemia     Objective:     Vitals: LMP  (LMP Unknown)   There is no height or weight on file to calculate BMI.  Advanced Directives 04/09/2020 12/16/2019 04/09/2019 03/07/2019 09/01/2018 08/23/2018 04/04/2018  Does Patient Have a Medical Advance Directive? Yes No Yes No No No Yes  Type of AParamedicof [place] will - [place] will - - - [place]  Does patient want to make changes to medical advance directive? - - - - - - -  Copy of [place]in Chart? Yes - validated most recent copy scanned in chart (See row information) - Yes - validated most recent copy scanned in chart (See row information) - - - Yes  Would patient like information on creating a medical advance directive? - - - No - Patient  declined No - Patient declined No - Patient declined -    Tobacco Social History   Tobacco Use  Smoking Status Never Smoker  Smokeless Tobacco Former USystems developer . Types: Snuff     Counseling given: Not Answered   Clinical Intake:  Pre-visit preparation completed: Yes  Pain : No/denies pain Pain Score: 0-No pain     Nutritional Risks: Nausea/ vomitting/ diarrhea(Diarrhea from diet and medication.) Diabetes: Yes  How often do you need to have someone help you when you read instructions, pamphlets, or other written materials from your doctor or pharmacy?: 1 - Never   Diabetes:  Is the patient diabetic?  Yes  If diabetic, was a CBG obtained today?  No  Did the patient bring in their glucometer from home?  No  How often do you monitor your CBG's? Once daily in the morning.   Financial Strains and Diabetes Management:  Are you having any financial strains with the device, your supplies or your medication? No .  Does the patient want to be seen by Chronic Care Management for management of their diabetes?  No  Would the patient like to be referred to a Nutritionist or for Diabetic Management?  No   Diabetic Exams:  Diabetic Eye Exam: Completed 03/10/20. Repeat yearly.  Diabetic Foot Exam: Completed 05/30/19. Repeat yearly.   Interpreter Needed?: No  Information [facility] :: [facility]  Past Medical History:  Diagnosis Date  . AICD (automatic cardioverter/defibrillator) present   . Anemia   . Anxiety   . CHF (congestive heart failure) ([place]   . Complication of anesthesia   . Depression   .  Diabetes ([place])   . Dilated cardiomyopathy ([place])   . Diverticulosis   . Dyspnea   . Dysrhythmia   . Edema    FEET/ANKLES OCCAS  . GERD (gastroesophageal reflux disease)   . Hyperlipidemia   . Hypothyroidism   . LV dysfunction   . Orthopnea    USES 3 PILLOWS  . PONV (postoperative nausea and vomiting)   . Reflux   . Thrombocytopenia ([place])   . Thyroid disease   . VHD  (valvular heart disease)    Past Surgical History:  Procedure Laterality Date  . ABDOMINAL HYSTERECTOMY    . BI-VENTRICULAR IMPLANTABLE CARDIOVERTER DEFIBRILLATOR [name] 02/11/2015   Procedure: BI-VENTRICULAR IMPLANTABLE CARDIOVERTER DEFIBRILLATOR  (CRT-D);  Surgeon: Deboraha Sprang, MD;  Location[facility] CATH LAB;  Service: Cardiovascular;  Laterality: [name];  . BONE MARROW BIOPSY  2007   spine  . CARDIAC CATHETERIZATION    . CATARACT EXTRACTION W/PHACO Right 01/03/2017   Procedure: CATARACT EXTRACTION PHACO AND INTRAOCULAR LENS PLACEMENT (IOC);  Surgeon: Leandrew Koyanagi, MD;  Location: [facility] ORS;  Service: Ophthalmology;  Laterality: Right;  [place] 00:50 AP% 19.5 CDE 9.94 fluid pack lot # [id number]  . CATARACT EXTRACTION W/PHACO Left 10/27/2016   Procedure: CATARACT EXTRACTION PHACO AND INTRAOCULAR LENS PLACEMENT (IOC);  Surgeon: Leandrew Koyanagi, MD;  Location: [facility] ORS;  Service: Ophthalmology;  Laterality: Left;  [place]  01:12 AP%  21.0 CDE  11.59 Fluid pack lot # [id number]  . CHOLECYSTECTOMY    . INSERT / REPLACE / REMOVE PACEMAKER     AICD  . LEAD REVISION [name] 02/11/2015   Procedure: LEAD REVISION;  Surgeon: Deboraha Sprang, MD;  Location: [facility] CATH LAB;  Service: Cardiovascular;  Laterality: [name];  . PARTIAL HYSTERECTOMY  1977  . TOOTH EXTRACTION  09/23/2015   Family History  Problem Relation Age of Onset  . Heart disease Mother   . Heart attack Mother   . Prostate cancer Father   . Ulcers Father   . Stroke Sister   . Heart attack Sister   . Diabetes Sister   . Diabetes Sister   . Heart attack Brother   . Stroke Brother   . Breast cancer Neg Hx    Social History   Socioeconomic History  . Marital status: Divorced    Spouse name: Not on file  . Number of children: 4  . Years of education: Not on file  . Highest education level: GED or equivalent  Occupational History  . Occupation: retired  Tobacco Use  . Smoking status: Never Smoker  . Smokeless tobacco: Former Systems developer    Types:  Snuff  Substance and Sexual Activity  . Alcohol use: No    Alcohol/week: 0.0 standard drinks  . Drug use: No  . Sexual activity: Not on file  Other Topics Concern  . Not on file  Social History Narrative  . Not on file   Social Determinants of Health   Financial Resource Strain: Low Risk   . Difficulty of Paying Living Expenses: Not very hard  Food Insecurity: No Food Insecurity  . Worried About Charity fundraiser in the Last Year: Never true  . Ran Out of Food in the Last Year: Never true  Transportation Needs: No Transportation Needs  . Lack of Transportation (Medical): No  . Lack of Transportation (Non-Medical): No  Physical Activity: Insufficiently Active  . Days of Exercise per Week: 2 days  . Minutes of Exercise per Session: 10 min  Stress: No Stress Concern Present  .  Feeling of Stress : Not at all  Social Connections: Slightly Isolated  . Frequency of Communication with Friends and Family: More than three times a week  . Frequency of Social Gatherings with Friends and Family: More than three times a week  . Attends Religious Services: More than 4 times per year  . Active Member of Clubs or Organizations: Yes  . Attends Archivist Meetings: More than 4 times per year  . Marital Status: Divorced    Outpatient Encounter Medications as of 04/09/2020  Medication Sig  . acetaminophen (TYLENOL) 500 MG tablet Take 1,000 mg by mouth 2 (two) times daily as needed for mild pain or moderate pain.  . Alcohol Swabs (B-D SINGLE USE SWABS REGULAR) PADS USE TO CHECK BLOOD SUGAR DAILY  . aspirin EC 81 MG tablet Take 81 mg by mouth daily.  Marland Kitchen atorvastatin (LIPITOR) 20 MG tablet TAKE 1 TABLET EVERY DAY  . carvedilol (COREG) 3.125 MG tablet TAKE 1/2 TABLET BY MOUTH TWICE DAILY (DOSE CHANGE)  . Cholecalciferol (VITAMIN D) 125 MCG (5000 UT) CAPS Take 5,000 mg by mouth once a week.  . ferrous sulfate 325 (65 FE) MG tablet Take 325 mg by mouth 2 (two) times daily with a meal.  .  furosemide (LASIX) 40 MG tablet Take 20 mg by mouth daily.   Marland Kitchen glipiZIDE (GLUCOTROL XL) 2.5 MG 24 hr tablet TAKE 1 TABLET EVERY DAY WITH BREAKFAST  . glucose blood (TRUE METRIX BLOOD GLUCOSE TEST) test strip CHECK BLOOD SUGAR EVERY DAY  . JANUMET XR 367 150 9434 MG TB24 TAKE 1 TABLET EVERY DAY  . pantoprazole (PROTONIX) 40 MG tablet Take 1 tablet (40 mg total) by mouth daily.  . sacubitril-valsartan (ENTRESTO) 24-26 MG TAKE 1/2 TABLET EVERY 12 HOURS (TWICE DAILY)  . senna (SENOKOT) 8.6 MG TABS tablet Take 1 tablet by mouth daily as needed for mild constipation.  Marland Kitchen SYNTHROID 50 MCG tablet TAKE 1 TABLET EVERY DAY  . TRUEplus Lancets 33G MISC CHECK BLOOD SUGAR ONE TIME A DAY  . chlorhexidine (PERIDEX) 0.12 % solution 15 mLs 2 (two) times daily.   No facility-administered encounter medications on file as of 04/09/2020.    Activities of Daily Living In your present state of health, do you have any difficulty performing the following activities: 04/09/2020  Hearing? N  Vision? N  Difficulty concentrating or making decisions? N  Walking or climbing stairs? N  Dressing or bathing? N  Doing errands, shopping? N  Preparing Food and eating ? N  Using the Toilet? N  In the past six months, have you accidently leaked urine? Y  Comment Occasionally, wears protection daily.  Do you have problems with loss of bowel control? Y  Comment Rarely, due to diarrhea.  Managing your Medications? N  Managing your Finances? N  Housekeeping or managing your Housekeeping? N  Some recent data might be hidden    Patient Care Team: Birdie Sons, MD as PCP - General (Family Medicine) Corey Skains, MD as Consulting Physician (Cardiology) Deboraha Sprang, MD as Consulting Physician (Cardiology) Leandrew Koyanagi, MD as Referring Physician (Ophthalmology) Eulogio Bear, MD as Consulting Physician (Ophthalmology) Gardiner Barefoot, DPM as Consulting Physician (Podiatry)    Assessment:   This is a routine  wellness examination for [place].  Exercise Activities and Dietary recommendations Current Exercise Habits: Home exercise routine, Type of exercise: walking, Time (Minutes): 15, Frequency (Times/Week): 2, Weekly Exercise (Minutes/Week): 30, Intensity: Mild, Exercise limited by: None identified  Goals    .  DIET - EAT MORE FRUITS AND VEGETABLES     Recommend increasing fruits and vegetables in daily diet to 2-3 servings each a day.     Marland Kitchen HEMOGLOBIN A1C < 7.0    . Peak Blood Glucose < 180       Fall Risk: Fall Risk  04/09/2020 06/14/2019 04/09/2019 04/04/2018 01/09/2018  Falls in the past year? 0 0 1 No No  Number falls in past yr: 0 - 0 - -  Injury with Fall? 0 - 1 - -  Comment - - broke right foot - -  Follow up - - Falls prevention discussed - -    FALL RISK PREVENTION PERTAINING TO THE HOME:  Any stairs in or around the home? Yes  If so, are there any without handrails? No   Home free of loose throw rugs in walkways, pet beds, electrical cords, etc? Yes  Adequate lighting in your home to reduce risk of falls? Yes   ASSISTIVE DEVICES UTILIZED TO PREVENT FALLS:  Life alert? No  Use of a cane, walker or w/c? No  Grab bars in the bathroom? No  Shower chair or bench in shower? No  Elevated toilet seat or a handicapped toilet? Yes   TIMED UP AND GO:  Was the test performed? No .    Depression Screen PHQ 2/9 Scores 04/09/2020 04/09/2019 04/09/2019 04/04/2018  PHQ - 2 Score 0 0 0 0  PHQ- 9 Score - 3 - -     Cognitive Function     6CIT Screen 04/09/2020 04/04/2018 03/23/2017  What Year? 0 points 0 points 0 points  What month? 0 points 0 points 0 points  What time? 0 points 0 points 0 points  Count back from 20 0 points 0 points 0 points  Months in reverse 0 points 0 points 0 points  Repeat phrase 2 points 6 points 6 points  Total Score _0 Immunization History  Administered Date(s) Administered  . Fluad Quad(high Dose 65+) 07/22/2019  . Influenza, High Dose Seasonal PF  07/07/2016, 10/09/2017, 08/01/2018  . Influenza,inj,Quad PF,6+ Mos 08/14/2015  . PFIZER SARS-COV-2 Vaccination 12/13/2019, 01/07/2020  . Pneumococcal Conjugate-13 09/18/2014  . Pneumococcal Polysaccharide-23 09/11/2015    Qualifies for Shingles Vaccine? Yes . Due for Shingrix. Pt has been advised to call insurance company to determine out of pocket expense. Advised may also receive vaccine at local pharmacy or Health Dept. Verbalized acceptance and understanding.  Tdap: Although this vaccine is not a covered service during a Wellness Exam, does the patient still wish to receive this vaccine today?  No . Advised may receive this vaccine at local pharmacy or Health Dept. Aware to provide a copy of the vaccination record if obtained from local pharmacy or Health Dept. Verbalized acceptance and understanding.  Flu Vaccine: Up to date  Pneumococcal Vaccine: Completed series  Screening Tests Health Maintenance  Topic Date Due  . MAMMOGRAM  07/05/2019  . URINE MICROALBUMIN  12/05/2019  . TETANUS/TDAP  04/09/2021 (Originally 05/08/1965)  . FOOT EXAM  05/29/2020  . INFLUENZA VACCINE  06/07/2020  . HEMOGLOBIN A1C  06/28/2020  . OPHTHALMOLOGY EXAM  03/10/2021  . DEXA SCAN  02/04/2022  . COLONOSCOPY  05/08/2023  . COVID-19 Vaccine  Completed  . Hepatitis C Screening  Completed  . PNA vac Low Risk Adult  Completed    Cancer Screenings:  Colorectal Screening: Completed 05/07/13. Repeat every 10 years.   Mammogram: Completed 07/04/17. Repeat every 1-2 years as advised.  Ordered 12/30/19. Pt provided with contact info and advised to call to schedule appt.   Bone Density: Completed 02/05/20. Results reflect OSTEOPOROSIS. Repeat every 2 years.   Lung Cancer Screening: (Low Dose CT Chest recommended if Age 46-80 years, 30 pack-year currently smoking OR have quit w/in 15years.) does not qualify.   Additional Screening:  Hepatitis C Screening: Up to date  Dental Screening: Recommended annual dental  exams for proper oral hygiene   Community Resource Referral:  CRR required this visit? No     Plan:  I have personally reviewed and addressed the Medicare Annual Wellness questionnaire and have noted the following in the patient's chart:  A. Medical and social history B. Use of alcohol, tobacco or illicit drugs  C. Current medications and supplements D. Functional ability and status E.  Nutritional status F.  Physical activity G. Advance directives H. List of other physicians I.  Hospitalizations, surgeries, and ER visits in previous 12 months J.  [place] such as hearing and vision if needed, cognitive and depression L. Referrals and appointments   In addition, I have reviewed and discussed with patient certain preventive protocols, quality metrics, and best practice recommendations. A written personalized care plan for preventive services as well as general preventive health recommendations were provided to patient.   [place]endora Score, [place]  3/0/0511 Nurse Health Advisor   Nurse Notes: Pt needs a urine check at next in office apt. Pt plans to call [facility] to set up mammogram.

## 2020-04-09 ENCOUNTER — Ambulatory Visit (INDEPENDENT_AMBULATORY_CARE_PROVIDER_SITE_OTHER): Payer: Medicare HMO

## 2020-04-09 ENCOUNTER — Other Ambulatory Visit: Payer: Self-pay

## 2020-04-09 DIAGNOSIS — Z0000 Encounter for general adult medical examination without abnormal findings: Secondary | ICD-10-CM | POA: Diagnosis not present

## 2020-04-09 NOTE — Patient Instructions (Signed)
[name] , Thank you for taking time to come for your Medicare Wellness Visit. I appreciate your ongoing commitment to your health goals. Please review the following plan we discussed and let me know if I can assist you in the future.   Screening recommendations/referrals: Colonoscopy: Up to date, due 05/2023 Mammogram: Currently due. Order on file. Pt to call and set up apt with [facility].  Bone Density: Up to date, due 01/2022 Recommended yearly ophthalmology/optometry visit for glaucoma screening and checkup Recommended yearly dental visit for hygiene and checkup  Vaccinations: Influenza vaccine: Up to date Pneumococcal vaccine: Completed series Tdap vaccine: Pt declines today.  Shingles vaccine: Pt declines today.     Advanced directives: Currently on file.  Conditions/risks identified: Recommend to continue to increase vegetable intake to 2 servings a day.   Next appointment: 06/02/20 @ 10:20 AM with [name]. Declined scheduling an AWV for 2022 at this time.   Preventive Care 60 Years and Older, Female Preventive care refers to lifestyle choices and visits with your health care provider that can promote health and wellness. What does preventive care include?  A yearly physical exam. This is also called an annual well check.  Dental exams once or twice a year.  Routine eye exams. Ask your health care provider how often you should have your eyes checked.  Personal lifestyle choices, including:  Daily care of your teeth and gums.  Regular physical activity.  Eating a healthy diet.  Avoiding tobacco and drug use.  Limiting alcohol use.  Practicing safe sex.  Taking low-dose aspirin every day.  Taking vitamin and mineral supplements as recommended by your health care provider. What happens during an annual well check? The services and screenings done by your health care provider during your annual well check will depend on your age, overall health,  lifestyle risk factors, and family history of disease. Counseling  Your health care provider may ask you questions about your:  Alcohol use.  Tobacco use.  Drug use.  Emotional well-being.  Home and relationship well-being.  Sexual activity.  Eating habits.  History of falls.  Memory and ability to understand (cognition).  Work and work Astronomer.  Reproductive health. Screening  You may have the following tests or measurements:  Height, weight, and BMI.  Blood pressure.  Lipid and cholesterol levels. These may be checked every 5 years, or more frequently if you are over 25 years old.  Skin check.  Lung cancer screening. You may have this screening every year starting at age 84 if you have a 30-pack-year history of smoking and currently smoke or have quit within the past 15 years.  Fecal occult blood test (FOBT) of the stool. You may have this test every year starting at age 41.  Flexible sigmoidoscopy or colonoscopy. You may have a sigmoidoscopy every 5 years or a colonoscopy every 10 years starting at age 67.  Hepatitis C blood test.  Hepatitis B blood test.  Sexually transmitted disease (STD) testing.  Diabetes screening. This is done by checking your blood sugar (glucose) after you have not eaten for a while (fasting). You may have this done every 1-3 years.  Bone density scan. This is done to screen for osteoporosis. You may have this done starting at age 33.  Mammogram. This may be done every 1-2 years. Talk to your health care provider about how often you should have regular mammograms. Talk with your health care provider about your test results, treatment options, and  if necessary, the need for more tests. Vaccines  Your health care provider may recommend certain vaccines, such as:  Influenza vaccine. This is recommended every year.  Tetanus, diphtheria, and acellular pertussis (Tdap, Td) vaccine. You may need a Td booster every 10 years.  Zoster  vaccine. You may need this after age 60.  Pneumococcal 13-valent conjugate (PCV13) vaccine. One dose is recommended after age 9.  Pneumococcal polysaccharide (PPSV23) vaccine. One dose is recommended after age 73. Talk to your health care provider about which screenings and vaccines you need and how often you need them. This information is not intended to replace advice given to you by your health care provider. Make sure you discuss any questions you have with your health care provider. Document Released: 11/20/2015 Document Revised: 07/13/2016 Document Reviewed: 08/25/2015 Elsevier Interactive Patient Education  2017 [place] Prevention in the Home Falls can cause injuries. They can happen to people of all ages. There are many things you can do to make your home safe and to help prevent falls. What can I do on the outside of my home?  Regularly fix the edges of walkways and driveways and fix any cracks.  Remove anything that might make you trip as you walk through a door, such as a raised step or threshold.  Trim any bushes or trees on the path to your home.  Use bright outdoor lighting.  Clear any walking paths of anything that might make someone trip, such as rocks or tools.  Regularly check to see if handrails are loose or broken. Make sure that both sides of any steps have handrails.  Any raised decks and porches should have guardrails on the edges.  Have any leaves, snow, or ice cleared regularly.  Use sand or salt on walking paths during winter.  Clean up any spills in your garage right away. This includes oil or grease spills. What can I do in the bathroom?  Use night lights.  Install grab bars by the toilet and in the tub and shower. Do not use towel bars as grab bars.  Use non-skid mats or decals in the tub or shower.  If you need to sit down in the shower, use a plastic, non-slip stool.  Keep the floor dry. Clean up any water that spills on the  floor as soon as it happens.  Remove soap buildup in the tub or shower regularly.  Attach bath mats securely with double-sided non-slip rug tape.  Do not have throw rugs and other things on the floor that can make you trip. What can I do in the bedroom?  Use night lights.  Make sure that you have a light by your bed that is easy to reach.  Do not use any sheets or blankets that are too big for your bed. They should not hang down onto the floor.  Have a firm chair that has side arms. You can use this for support while you get dressed.  Do not have throw rugs and other things on the floor that can make you trip. What can I do in the kitchen?  Clean up any spills right away.  Avoid walking on wet floors.  Keep items that you use a lot in easy-to-reach places.  If you need to reach something above you, use a strong step stool that has a grab bar.  Keep electrical cords out of the way.  Do not use floor polish or wax that makes floors slippery. If you  must use wax, use non-skid floor wax.  Do not have throw rugs and other things on the floor that can make you trip. What can I do with my stairs?  Do not leave any items on the stairs.  Make sure that there are handrails on both sides of the stairs and use them. Fix handrails that are broken or loose. Make sure that handrails are as long as the stairways.  Check any carpeting to make sure that it is firmly attached to the stairs. Fix any carpet that is loose or worn.  Avoid having throw rugs at the top or bottom of the stairs. If you do have throw rugs, attach them to the floor with carpet tape.  Make sure that you have a light switch at the top of the stairs and the bottom of the stairs. If you do not have them, ask someone to add them for you. What else can I do to help prevent falls?  Wear shoes that:  Do not have high heels.  Have rubber bottoms.  Are comfortable and fit you well.  Are closed at the toe. Do not wear  sandals.  If you use a stepladder:  Make sure that it is fully opened. Do not climb a closed stepladder.  Make sure that both sides of the stepladder are locked into place.  Ask someone to hold it for you, if possible.  Clearly mark and make sure that you can see:  Any grab bars or handrails.  First and last steps.  Where the edge of each step is.  Use tools that help you move around (mobility aids) if they are needed. These include:  Canes.  Walkers.  Scooters.  Crutches.  Turn on the lights when you go into a dark area. Replace any light bulbs as soon as they burn out.  Set up your furniture so you have a clear path. Avoid moving your furniture around.  If any of your floors are uneven, fix them.  If there are any pets around you, be aware of where they are.  Review your medicines with your doctor. Some medicines can make you feel dizzy. This can increase your chance of falling. Ask your doctor what other things that you can do to help prevent falls. This information is not intended to replace advice given to you by your health care provider. Make sure you discuss any questions you have with your health care provider. Document Released: 08/20/2009 Document Revised: 03/31/2016 Document Reviewed: 11/28/2014 Elsevier Interactive Patient Education  2017 [employer].

## 2020-04-14 NOTE — Progress Notes (Signed)
Remote ICD transmission.   

## 2020-05-04 ENCOUNTER — Encounter: Payer: Self-pay | Admitting: Family Medicine

## 2020-05-04 ENCOUNTER — Ambulatory Visit
Admission: RE | Admit: 2020-05-04 | Discharge: 2020-05-04 | Disposition: A | Discharge status: Home / Self Care | Payer: Medicare HMO | Source: Ambulatory Visit | Attending: Family Medicine | Admitting: Family Medicine

## 2020-05-04 ENCOUNTER — Other Ambulatory Visit: Payer: Self-pay

## 2020-05-04 ENCOUNTER — Ambulatory Visit
Admission: RE | Admit: 2020-05-04 | Discharge: 2020-05-04 | Disposition: A | Discharge status: Home / Self Care | Payer: Medicare HMO | Attending: Family Medicine | Admitting: Family Medicine

## 2020-05-04 ENCOUNTER — Ambulatory Visit (INDEPENDENT_AMBULATORY_CARE_PROVIDER_SITE_OTHER): Payer: Medicare HMO | Admitting: Family Medicine

## 2020-05-04 VITALS — BP 120/77 | HR 79 | Temp 97.1°F | Ht 63.0 in | Wt 129.8 lb

## 2020-05-04 DIAGNOSIS — D696 Thrombocytopenia, unspecified: Secondary | ICD-10-CM

## 2020-05-04 DIAGNOSIS — D61818 Other pancytopenia: Secondary | ICD-10-CM | POA: Diagnosis not present

## 2020-05-04 DIAGNOSIS — I5022 Chronic systolic (congestive) heart failure: Secondary | ICD-10-CM | POA: Diagnosis not present

## 2020-05-04 DIAGNOSIS — M542 Cervicalgia: Secondary | ICD-10-CM | POA: Diagnosis not present

## 2020-05-04 DIAGNOSIS — D708 Other neutropenia: Secondary | ICD-10-CM

## 2020-05-04 NOTE — Patient Instructions (Signed)
.   Please go to the lab draw station in [address] on the second floor of [facility] . Normal hours are 8:00am to 12:30pm and 1:30pm to 4:00pm Monday through Friday  . Go to the [facility] on [facility] for neck Xray

## 2020-05-04 NOTE — Progress Notes (Signed)
Established patient visit   Patient: Bethany [name]   DOB: April 12, 1946   74 y.o. Female  MRN: [id number] Visit Date: 05/04/2020  Today's healthcare provider: [name], MD   Chief Complaint  Patient presents with  . Neck Pain   I,Latasha Walston,acting as a scribe for [name], MD.,have documented all relevant documentation on the behalf of [name], MD,as directed by  [name], MD while in the presence of [name], MD.  Subjective    Neck Pain  This is a new problem. Episode onset: few days ago. The problem occurs intermittently. The problem has been unchanged. The pain is associated with nothing. The pain is present in the left side. The quality of the pain is described as aching. Associated symptoms include leg pain and weakness. She has tried nothing for the symptoms.  Pain radiates into left arm. No arm weakness. No known injuries.     Medications: Outpatient Medications Prior to Visit  Medication Sig  . acetaminophen (TYLENOL) 500 MG tablet Take 1,000 mg by mouth 2 (two) times daily as needed for mild pain or moderate pain.  . Alcohol Swabs (B-D SINGLE USE SWABS REGULAR) PADS USE TO CHECK BLOOD SUGAR DAILY  . aspirin EC 81 MG tablet Take 81 mg by mouth daily.  Marland Kitchen atorvastatin (LIPITOR) 20 MG tablet TAKE 1 TABLET EVERY DAY  . carvedilol (COREG) 3.125 MG tablet TAKE 1/2 TABLET BY MOUTH TWICE DAILY (DOSE CHANGE)  . chlorhexidine (PERIDEX) 0.12 % solution 15 mLs 2 (two) times daily.  . Cholecalciferol (VITAMIN D) 125 MCG (5000 UT) CAPS Take 5,000 mg by mouth once a week.  . ferrous sulfate 325 (65 FE) MG tablet Take 325 mg by mouth 2 (two) times daily with a meal.  . furosemide (LASIX) 40 MG tablet Take 20 mg by mouth daily.   Marland Kitchen glipiZIDE (GLUCOTROL XL) 2.5 MG 24 hr tablet TAKE 1 TABLET EVERY DAY WITH BREAKFAST  . glucose blood (TRUE METRIX BLOOD GLUCOSE TEST) test strip CHECK BLOOD SUGAR EVERY DAY  . JANUMET XR [phone] MG TB24 TAKE 1 TABLET EVERY  DAY  . pantoprazole (PROTONIX) 40 MG tablet Take 1 tablet (40 mg total) by mouth daily.  . sacubitril-valsartan (ENTRESTO) 24-26 MG TAKE 1/2 TABLET EVERY 12 HOURS (TWICE DAILY)  . senna (SENOKOT) 8.6 MG TABS tablet Take 1 tablet by mouth daily as needed for mild constipation.  Marland Kitchen SYNTHROID 50 MCG tablet TAKE 1 TABLET EVERY DAY  . TRUEplus Lancets 33G MISC CHECK BLOOD SUGAR ONE TIME A DAY   No facility-administered medications prior to visit.    Review of Systems  Constitutional: Negative.   Respiratory: Negative.   Cardiovascular: Negative.   Musculoskeletal: Positive for neck pain.  Neurological: Positive for weakness.      Objective    BP 120/77 (BP Location: Right Arm, Patient Position: Sitting, Cuff Size: Normal)   Pulse 79   Temp (!) 97.1 F (36.2 C) (Temporal)   Ht 5\' 3"  (1.6 m)   Wt 129 lb 12.8 oz (58.9 kg)   LMP  (LMP Unknown)   BMI 22.99 kg/m    Physical Exam   General: Appearance:    Well developed, well nourished female in no acute distress  Eyes:    PERRL, conjunctiva/corneas clear, EOM's intact       Lungs:     Clear to auscultation bilaterally, respirations unlabored  Heart:    Normal heart rate. Normal rhythm.  2/6 systolic murmur  MS:  All extremities are intact. Slight tenderness along +5/5 UE strength bilaterally. Pain reproduced by neck rotation to the left. Minimal spine tenderness.   Neurologic:   Awake, alert, oriented x 3. No apparent focal neurological           defect.       No results found for any visits on 05/04/20.  Assessment & Plan     1. Neck pain on left side, radiating to left arm   - DG Cervical Spine Complete; Future  2. Other neutropenia (HCC) -CBC  3. Chronic systolic heart failure (HCC) Well compensated on current medications.   4. Thrombocytopenia (HCC) -CBC  5. Pancytopenia ([place]) She also complains of frequently feeling cold.  -CBC   No follow-ups on file.         Lelon Huh, MD  [facility] [phone] (phone) [phone] (fax)  [place]

## 2020-05-05 ENCOUNTER — Telehealth: Payer: Self-pay

## 2020-05-05 LAB — CBC
Hematocrit: 32.4 % — ABNORMAL LOW (ref 34.0–46.6)
Hemoglobin: 10.8 g/dL — ABNORMAL LOW (ref 11.1–15.9)
MCH: 29.4 pg (ref 26.6–33.0)
MCHC: 33.3 g/dL (ref 31.5–35.7)
MCV: 88 fL (ref 79–97)
Platelets: 96 10*3/uL — CL (ref 150–450)
RBC: 3.67 x10E6/uL — ABNORMAL LOW (ref 3.77–5.28)
RDW: 12.5 % (ref 11.7–15.4)
WBC: 3.8 10*3/uL (ref 3.4–10.8)

## 2020-05-05 NOTE — Telephone Encounter (Signed)
Attempted to contact patient, no answer left a voicemail. Okay for [facility] triage to advise patient of the 2 messages below.

## 2020-05-05 NOTE — Telephone Encounter (Signed)
-----   Message from Malva Limes, MD sent at 05/05/2020  7:11 AM EDT ----- Bethany [name] show a lot of arthritis which is probably contributing to her neck pain. This could be pinching a nerve in her neck. Recommend course of prednisone, 20mg  twice daily for 7 days.

## 2020-05-05 NOTE — Telephone Encounter (Signed)
-----   Message from Malva Limes, MD sent at 05/05/2020  7:10 AM EDT ----- She is slightly more anemic, but not severe. Platelet count is low, but stable. This is probably contributing to her feeling cold. She should take a daily multivitamin for seniors like centrum silver to make sure she is getting enough iron, b12 and folic acid. Need to check CBC ever 4-6 months.

## 2020-05-05 NOTE — Telephone Encounter (Signed)
Patient called and given results as noted below of labs and xray by Dr. Sherrie Mustache on 05/05/20, she verbalized understanding. She says she doesn't want to use the prednisone and asked is she could take Tylenol for the pain. I advised Tylenol 325 mg or 500  Mg is appropriate to take and to follow the instructions not taking more than is recommended and to take the least amount needed to ease the pain, she verbalized understanding.

## 2020-05-12 ENCOUNTER — Other Ambulatory Visit: Payer: Self-pay

## 2020-05-12 ENCOUNTER — Ambulatory Visit
Admission: EM | Admit: 2020-05-12 | Discharge: 2020-05-12 | Disposition: A | Discharge status: Home / Self Care | Payer: Medicare HMO | Attending: Emergency Medicine | Admitting: Emergency Medicine

## 2020-05-12 ENCOUNTER — Encounter: Payer: Self-pay | Admitting: Emergency Medicine

## 2020-05-12 DIAGNOSIS — M79662 Pain in left lower leg: Secondary | ICD-10-CM

## 2020-05-12 DIAGNOSIS — T148XXA Other injury of unspecified body region, initial encounter: Secondary | ICD-10-CM

## 2020-05-12 NOTE — ED Provider Notes (Signed)
HPI  SUBJECTIVE:  Bethany [name] is a 74 y.o. female who presents with left lateral calf pain starting today.  She describes it as crampy, waxing and waning.  It is not located anteriorly or posteriorly.  She reports bilateral lower extremity edema that has been present for several months, it is not any different today.  States that her calves seem to be the same size.  No knee, ankle pain.  She has been exercising more intensely and frequently recently with her sister.  States that she is walking further than normal.  She has had similar symptoms in this location, but it was not as intense as this time.  Some nausea, no vomiting.  No trauma to the calf.  No erythema, distal color changes, fever, change in her baseline shortness of breath, hemoptysis.  No surgery in the past 4 weeks, recent immobilization.  She has a past medical history of congestive heart failure, hypercholesterolemia on a statin, diabetes, hypertension.  No history of peripheral vascular disease, peripheral arterial disease, DVT, cancer, exogenous estrogen, chronic kidney disease, GI bleed, antiplatelet/anticoagulant use, liver disease, peripheral neuropathy.  She takes 81 mg aspirin daily.  [id number], Kirstie Peri, MD     Past Medical History:  Diagnosis Date  . AICD (automatic cardioverter/defibrillator) present   . Anemia   . Anxiety   . CHF (congestive heart failure) ([place])   . Complication of anesthesia   . Depression   . Diabetes ([place])   . Dilated cardiomyopathy ([place])   . Diverticulosis   . Dyspnea   . Dysrhythmia   . Edema    FEET/ANKLES OCCAS  . GERD (gastroesophageal reflux disease)   . Hyperlipidemia   . Hypothyroidism   . LV dysfunction   . Orthopnea    USES 3 PILLOWS  . PONV (postoperative nausea and vomiting)   . Reflux   . Thrombocytopenia ([place])   . Thyroid disease   . VHD (valvular heart disease)     Past Surgical History:  Procedure Laterality Date  . ABDOMINAL HYSTERECTOMY    . BI-VENTRICULAR  IMPLANTABLE CARDIOVERTER DEFIBRILLATOR [name] 02/11/2015   Procedure: BI-VENTRICULAR IMPLANTABLE CARDIOVERTER DEFIBRILLATOR  (CRT-D);  Surgeon: Deboraha Sprang, MD;  Location: [facility] CATH LAB;  Service: Cardiovascular;  Laterality: [name];  . BONE MARROW BIOPSY  2007   spine  . CARDIAC CATHETERIZATION    . CATARACT EXTRACTION W/PHACO Right 01/03/2017   Procedure: CATARACT EXTRACTION PHACO AND INTRAOCULAR LENS PLACEMENT (IOC);  Surgeon: Leandrew Koyanagi, MD;  Location: [facility] ORS;  Service: Ophthalmology;  Laterality: Right;  [place] 00:50 AP% 19.5 CDE 9.94 fluid pack lot # [id number]  . CATARACT EXTRACTION W/PHACO Left 10/27/2016   Procedure: CATARACT EXTRACTION PHACO AND INTRAOCULAR LENS PLACEMENT (IOC);  Surgeon: Leandrew Koyanagi, MD;  Location: [facility] ORS;  Service: Ophthalmology;  Laterality: Left;  [place]  01:12 AP%  21.0 CDE  11.59 Fluid pack lot # [id number]  . CHOLECYSTECTOMY    . INSERT / REPLACE / REMOVE PACEMAKER     AICD  . LEAD REVISION [name] 02/11/2015   Procedure: LEAD REVISION;  Surgeon: Deboraha Sprang, MD;  Location: [facility] CATH LAB;  Service: Cardiovascular;  Laterality: [name];  . PARTIAL HYSTERECTOMY  1977  . TOOTH EXTRACTION  09/23/2015    Family History  Problem Relation Age of Onset  . Heart disease Mother   . Heart attack Mother   . Prostate cancer Father   . Ulcers Father   . Stroke Sister   . Heart attack Sister   .  Diabetes Sister   . Diabetes Sister   . Heart attack Brother   . Stroke Brother   . Breast cancer Neg Hx     Social History   Tobacco Use  . Smoking status: Never Smoker  . Smokeless tobacco: Former Systems developer    Types: Snuff  Vaping Use  . Vaping Use: Never used  Substance Use Topics  . Alcohol use: No    Alcohol/week: 0.0 standard drinks  . Drug use: No    No current facility-administered medications for this encounter.  Current Outpatient Medications:  .  acetaminophen (TYLENOL) 500 MG tablet, Take 1,000 mg by mouth 2 (two) times daily as needed for mild pain  or moderate pain., Disp: , Rfl:  .  aspirin EC 81 MG tablet, Take 81 mg by mouth daily., Disp: , Rfl:  .  atorvastatin (LIPITOR) 20 MG tablet, TAKE 1 TABLET EVERY DAY, Disp: 90 tablet, Rfl: 3 .  carvedilol (COREG) 3.125 MG tablet, TAKE 1/2 TABLET BY MOUTH TWICE DAILY (DOSE CHANGE), Disp: 90 tablet, Rfl: 4 .  Cholecalciferol (VITAMIN D) 125 MCG (5000 UT) CAPS, Take 5,000 mg by mouth once a week., Disp: , Rfl:  .  ferrous sulfate 325 (65 FE) MG tablet, Take 325 mg by mouth 2 (two) times daily with a meal., Disp: , Rfl:  .  furosemide (LASIX) 40 MG tablet, Take 20 mg by mouth daily. , Disp: , Rfl:  .  glipiZIDE (GLUCOTROL XL) 2.5 MG 24 hr tablet, TAKE 1 TABLET EVERY DAY WITH BREAKFAST, Disp: 90 tablet, Rfl: 4 .  JANUMET XR [phone] MG TB24, TAKE 1 TABLET EVERY DAY, Disp: 90 tablet, Rfl: 1 .  Multiple Vitamin (MULTIVITAMIN) tablet, Take 1 tablet by mouth daily., Disp: , Rfl:  .  pantoprazole (PROTONIX) 40 MG tablet, Take 1 tablet (40 mg total) by mouth daily., Disp: 90 tablet, Rfl: 3 .  sacubitril-valsartan (ENTRESTO) 24-26 MG, TAKE 1/2 TABLET EVERY 12 HOURS (TWICE DAILY), Disp: , Rfl:  .  senna (SENOKOT) 8.6 MG TABS tablet, Take 1 tablet by mouth daily as needed for mild constipation., Disp: , Rfl:  .  SYNTHROID 50 MCG tablet, TAKE 1 TABLET EVERY DAY, Disp: 90 tablet, Rfl: 4 .  Alcohol Swabs (B-D SINGLE USE SWABS REGULAR) PADS, USE TO CHECK BLOOD SUGAR DAILY, Disp: 100 each, Rfl: 4 .  chlorhexidine (PERIDEX) 0.12 % solution, 15 mLs 2 (two) times daily., Disp: , Rfl:  .  glucose blood (TRUE METRIX BLOOD GLUCOSE TEST) test strip, CHECK BLOOD SUGAR EVERY DAY, Disp: 100 each, Rfl: 3 .  TRUEplus Lancets 33G MISC, CHECK BLOOD SUGAR ONE TIME A DAY, Disp: 100 each, Rfl: 4  Allergies  Allergen Reactions  . Other Other (See Comments)    NO BLOOD PRODUCTS- JEHOVAHS WITNESS  . Sulfa Antibiotics Other (See Comments)    Loss of taste     ROS  As noted in HPI.   Physical Exam  BP 99/61 (BP Location:  Left Arm)   Pulse 78   Temp 98.7 F (37.1 C) (Oral)   Resp 18   Ht '5\' 1"'  (1.549 m)   Wt 57.2 kg   LMP  (LMP Unknown)   SpO2 100%   BMI 23.81 kg/m   Constitutional: Well developed, well nourished, no acute distress Eyes:  EOMI, conjunctiva normal bilaterally HENT: Normocephalic, atraumatic,mucus membranes moist Respiratory: Normal inspiratory effort Cardiovascular: Normal rate GI: nondistended skin: No rash, skin intact Musculoskeletal: Trace bilateral lower extremity edema. Right calf slightly bigger than the  left. Positive tenderness lateral left calf. No tenderness over the anterior distal leg. No rash. No posterior calf tenderness, palpable cord. No medial thigh tenderness, palpable cord. No distal color changes. DP 2+ and equal bilaterally. No tenderness over the knee, ankle. Patient has full range of motion of the knee, ankle. Neurologic: Alert & oriented x 3, no focal neuro deficits Psychiatric: Speech and behavior appropriate   ED Course   Medications - No data to display  No orders of the defined types were placed in this encounter.   No results found for this or any previous visit (from the past 24 hour(s)). No results found.  ED Clinical Impression  1. Pain of left calf   2. Muscle strain      ED Assessment/Plan  Labs reviewed. Normal kidney function as of February 2021. Low platelets, but this seems to be at baseline.  Well criteria -2/9. Doubt DVT.   Suspect gastrocnemius or soleus muscle strain from the recent increased activity. She is tender along the lateral part of the leg, not posterior calf/popliteal area or medial thigh. Her right calf is larger than her left. Also discussed with patient that this could be statin induced myalgia. Doubt compartment syndrome. Will send home with 500 mg of Tylenol with 200 mg of ibuprofen 3-4 times a day as needed, magnesium supplements, Epsom salt soaks, ice or heat, whichever feels better, follow-up with Dr. Caryn Section in  several days, gave her strict ER return precautions.  Discussed  MDM, treatment plan, and plan for follow-up with patient. Discussed sn/sx that should prompt return to the ED. patient agrees with plan.   No orders of the defined types were placed in this encounter.   *This clinic note was created using Dragon dictation software. Therefore, there may be occasional mistakes despite careful proofreading.   ?    Melynda Ripple, MD 05/13/20 1246

## 2020-05-12 NOTE — Discharge Instructions (Addendum)
200 mg of ibuprofen combined with 500 mg of Tylenol together 3-4 times a day as needed for pain. Try taking some magnesium supplements, Epson salt soaks, ice or heat, whichever feels better. I would rest for the next several days. Follow-up with your doctor if not getting any better. This could be a result of your cholesterol medication. Go immediately to the ER for the signs and symptoms we discussed.

## 2020-05-12 NOTE — ED Triage Notes (Signed)
Patient in today c/o left lower leg pain x this afternoon. Patient denies any injury. Patient has not taken any OTC medications.

## 2020-05-16 ENCOUNTER — Other Ambulatory Visit: Payer: Self-pay | Admitting: Family Medicine

## 2020-05-16 DIAGNOSIS — I5022 Chronic systolic (congestive) heart failure: Secondary | ICD-10-CM

## 2020-05-16 DIAGNOSIS — R5383 Other fatigue: Secondary | ICD-10-CM

## 2020-05-16 NOTE — Telephone Encounter (Signed)
Requested Prescriptions  Pending Prescriptions Disp Refills   carvedilol (COREG) 3.125 MG tablet [Pharmacy Med Name: CARVEDILOL 3.125 MG Tablet] 90 tablet 1    Sig: TAKE 1/2 TABLET BY MOUTH TWICE DAILY (DOSE CHANGE)     Cardiovascular:  Beta Blockers Passed - 05/16/2020  1:20 PM      Passed - Last BP in normal range    BP Readings from Last 1 Encounters:  05/12/20 99/61         Passed - Last Heart Rate in normal range    Pulse Readings from Last 1 Encounters:  05/12/20 78         Passed - Valid encounter within last 6 months    Recent Outpatient Visits          1 week ago Neck pain on left side   [facility] Malva Limes, MD   4 months ago Type 2 diabetes mellitus without complication, without long-term current use of insulin [facility])   [facility] Malva Limes, MD   9 months ago Type 2 diabetes mellitus without complication, without long-term current use of insulin [facility])   [facility] Malva Limes, MD   9 months ago Muscle spasms of neck   [facility] Joycelyn Man M, [place]   11 months ago Annual physical exam   [facility] Malva Limes, MD      Future Appointments            In 2 weeks Fisher, Demetrios Isaacs, MD [facility]

## 2020-05-20 ENCOUNTER — Other Ambulatory Visit: Payer: Self-pay | Admitting: Family Medicine

## 2020-05-20 DIAGNOSIS — E119 Type 2 diabetes mellitus without complications: Secondary | ICD-10-CM

## 2020-05-20 DIAGNOSIS — K219 Gastro-esophageal reflux disease without esophagitis: Secondary | ICD-10-CM

## 2020-05-20 NOTE — Telephone Encounter (Signed)
Requested Prescriptions  Pending Prescriptions Disp Refills  . pantoprazole (PROTONIX) 40 MG tablet [Pharmacy Med Name: PANTOPRAZOLE SODIUM 40 MG Tablet Delayed Release] 90 tablet 0    Sig: TAKE 1 TABLET (40 MG TOTAL) BY MOUTH DAILY.     Gastroenterology: Proton Pump Inhibitors Passed - 05/20/2020  3:54 PM      Passed - Valid encounter within last 12 months    Recent Outpatient Visits          2 weeks ago Neck pain on left side   [facility] Malva Limes, MD   4 months ago Type 2 diabetes mellitus without complication, without long-term current use of insulin [facility])   [facility] Malva Limes, MD   9 months ago Type 2 diabetes mellitus without complication, without long-term current use of insulin [facility])   [facility] Malva Limes, MD   10 months ago Muscle spasms of neck   [facility] Joycelyn Man M, [place]   11 months ago Annual physical exam   [facility] Malva Limes, MD      Future Appointments            In 1 week Fisher, Demetrios Isaacs, MD [facility], [facility]           . TRUEplus Lancets 33G MISC [Pharmacy Med Name: TRUEPLUS LANCETS 33G] 100 each 4    Sig: CHECK BLOOD SUGAR ONE TIME DAILY     Endocrinology: Diabetes - Testing Supplies Passed - 05/20/2020  3:54 PM      Passed - Valid encounter within last 12 months    Recent Outpatient Visits          2 weeks ago Neck pain on left side   [facility] Malva Limes, MD   4 months ago Type 2 diabetes mellitus without complication, without long-term current use of insulin [facility])   [facility] Malva Limes, MD   9 months ago Type 2 diabetes mellitus without complication, without long-term current use of insulin [facility])   [facility] Malva Limes, MD   10 months ago Muscle spasms of neck   [facility] Joycelyn Man M, [place]   11 months ago Annual physical  exam   [facility] Malva Limes, MD      Future Appointments            In 1 week Fisher, Demetrios Isaacs, MD [facility], [facility]

## 2020-05-21 ENCOUNTER — Encounter: Payer: Self-pay | Admitting: Podiatry

## 2020-05-21 ENCOUNTER — Other Ambulatory Visit: Payer: Self-pay

## 2020-05-21 ENCOUNTER — Ambulatory Visit: Payer: Medicare HMO | Admitting: Podiatry

## 2020-05-21 DIAGNOSIS — B351 Tinea unguium: Secondary | ICD-10-CM

## 2020-05-21 DIAGNOSIS — M79674 Pain in right toe(s): Secondary | ICD-10-CM | POA: Diagnosis not present

## 2020-05-21 DIAGNOSIS — E119 Type 2 diabetes mellitus without complications: Secondary | ICD-10-CM | POA: Diagnosis not present

## 2020-05-21 NOTE — Progress Notes (Signed)
This patient returns to my office for at risk foot care.  This patient requires this care by a professional since this patient will be at risk due to having type 2 diabetes.  This patient is unable to cut nails herself since the patient cannot reach her nails.These nails are painful walking and wearing shoes.  This patient presents for at risk foot care today.  General Appearance  Alert, conversant and in no acute stress.  Vascular  Dorsalis pedis  are palpable  Bilaterally. Posterior tibial pulses are absent  B/L. Capillary return is within normal limits  bilaterally. Temperature is within normal limits  bilaterally.  Neurologic  Senn-Weinstein monofilament wire test within normal limits  bilaterally. Muscle power within normal limits bilaterally.  Nails Thick disfigured discolored nails with subungual debris  Right hallux.. No evidence of bacterial infection or drainage bilaterally.  Orthopedic  No limitations of motion  feet .  No crepitus or effusions noted.  No bony pathology or digital deformities noted.  Skin  normotropic skin with no porokeratosis noted bilaterally.  No signs of infections or ulcers noted.     Onychomycosis  Pain in right toes  Pain in left toes  Consent was obtained for treatment procedures.   Mechanical debridement of nail right hallux. performed with a nail nipper.  Filed with dremel without incident.    Return office visit   3 months                   Told patient to return for periodic foot care and evaluation due to potential at risk complications.   Helane Gunther DPM

## 2020-05-25 ENCOUNTER — Ambulatory Visit: Payer: Self-pay

## 2020-05-25 NOTE — Telephone Encounter (Signed)
FYI

## 2020-05-25 NOTE — Telephone Encounter (Signed)
Pt called due to elevated am BS 130 past 2 days. Pt stated that she normally runs in the 80's to 90's. Pt received Shingles vaccine 05/23/20.  Pt began having symptoms Sunday. Pt c/o weakness and has hot and cold flashes and this am she woke up in a sweating. Pt stated she is eating and drinking and denies nausea or vomiting or abdominal pain. Pt stated she had some confusion yesterday am. Pt stated she feels better today than yesterday.  Care advice given  And pt verbalized understanding. Advised to pt to check her BS before meals and at bedtime. Advised pt that after her shingles vaccine, her body may be feeling bad due to her immune system building antibodies.  Advised to call back if blood sugar continues to be elevated and her symptoms do not improve or worsen over the next couple of days.  Advised pt to drink plenty of sugar free fluids (water) and to rest.   Reason for Disposition . Blood glucose 70-240 mg/dL (3.9 -13.3 mmol/L)  Answer Assessment - Initial Assessment Questions 1. BLOOD GLUCOSE: "What is your blood glucose level?"      130 2. ONSET: "When did you check the blood glucose?"     *this am (0800) 3. USUAL RANGE: "What is your glucose level usually?" (e.g., usual fasting morning value, usual evening value)     80 -90 4. KETONES: "Do you check for ketones (urine or blood test strips)?" If yes, ask: "What does the test show now?"      *No Answer* 5. TYPE 1 or 2:  "Do you know what type of diabetes you have?"  (e.g., Type 1, Type 2, Gestational; doesn't know)     Type 2  6. INSULIN: "Do you take insulin?" "What type of insulin(s) do you use? What is the mode of delivery? (syringe, pen; injection or pump)?"      no 7. DIABETES PILLS: "Do you take any pills for your diabetes?" If yes, ask: "Have you missed taking any pills recently?"     Yes-no missed pills recently  8. OTHER SYMPTOMS: "Do you have any symptoms?" (e.g., fever, frequent urination, difficulty breathing, dizziness,  weakness, vomiting)     Weakness, hot and cold flashes, body aches, fatigued,no abdominal pain. Last BM this am  9. PREGNANCY: "Is there any chance you are pregnant?" "When was your last menstrual period?"     n/a  Protocols used: DIABETES - HIGH BLOOD SUGAR-A-AH

## 2020-05-27 ENCOUNTER — Ambulatory Visit
Admission: RE | Admit: 2020-05-27 | Discharge: 2020-05-27 | Disposition: A | Discharge status: Home / Self Care | Payer: Medicare HMO | Source: Ambulatory Visit | Attending: Family Medicine | Admitting: Family Medicine

## 2020-05-27 ENCOUNTER — Other Ambulatory Visit: Payer: Self-pay

## 2020-05-27 DIAGNOSIS — Z1231 Encounter for screening mammogram for malignant neoplasm of breast: Secondary | ICD-10-CM | POA: Diagnosis not present

## 2020-05-29 ENCOUNTER — Ambulatory Visit: Payer: Medicaid Other | Admitting: Family Medicine

## 2020-06-02 ENCOUNTER — Ambulatory Visit (INDEPENDENT_AMBULATORY_CARE_PROVIDER_SITE_OTHER): Payer: Medicare HMO | Admitting: Family Medicine

## 2020-06-02 ENCOUNTER — Encounter: Payer: Self-pay | Admitting: Family Medicine

## 2020-06-02 ENCOUNTER — Other Ambulatory Visit: Payer: Self-pay

## 2020-06-02 VITALS — BP 111/71 | HR 69 | Temp 96.8°F | Resp 16 | Wt 130.0 lb

## 2020-06-02 DIAGNOSIS — E119 Type 2 diabetes mellitus without complications: Secondary | ICD-10-CM

## 2020-06-02 DIAGNOSIS — E039 Hypothyroidism, unspecified: Secondary | ICD-10-CM

## 2020-06-02 DIAGNOSIS — R200 Anesthesia of skin: Secondary | ICD-10-CM | POA: Diagnosis not present

## 2020-06-02 DIAGNOSIS — R202 Paresthesia of skin: Secondary | ICD-10-CM | POA: Diagnosis not present

## 2020-06-02 DIAGNOSIS — I5022 Chronic systolic (congestive) heart failure: Secondary | ICD-10-CM

## 2020-06-02 LAB — POCT GLYCOSYLATED HEMOGLOBIN (HGB A1C)
Est. average glucose Bld gHb Est-mCnc: 166
Hemoglobin A1C: 7.4 % — AB (ref 4.0–5.6)

## 2020-06-02 LAB — POCT UA - MICROALBUMIN: Microalbumin Ur, POC: NEGATIVE mg/L

## 2020-06-02 MED ORDER — DAPAGLIFLOZIN PROPANEDIOL 5 MG PO TABS: 5.0000 mg | ORAL_TABLET | Freq: Every day | ORAL | 0 refills | Status: DC

## 2020-06-02 MED ORDER — FUROSEMIDE 40 MG PO TABS: 20.0000 mg | ORAL_TABLET | Freq: Every day | ORAL | Status: DC

## 2020-06-02 NOTE — Progress Notes (Signed)
I,Roshena L Chambers,acting as a scribe for [name], MD.,have documented all relevant documentation on the behalf of [name], MD,as directed by  [name], MD while in the presence of [name], MD.  Established patient visit   Patient: Bethany [name]   DOB: [birth year]   74 y.o. Female  MRN: [id number] Visit Date: 06/02/2020  Today's healthcare provider: [name], MD   Chief Complaint  Patient presents with  . Diabetes  . Hypothyroidism   Subjective    HPI  Diabetes Mellitus Type II, Follow-up  Lab Results  Component Value Date   HGBA1C 6.8 (A) 12/30/2019   HGBA1C 7.2 (A) 08/19/2019   HGBA1C 7.3 (A) 03/13/2019   Wt Readings from Last 3 Encounters:  06/02/20 130 lb (59 kg)  05/12/20 126 lb (57.2 kg)  05/04/20 129 lb 12.8 oz (58.9 kg)   Last seen for diabetes 5 months ago.  Management since then includes continue same medication. She reports good compliance with treatment. She is not having side effects.  Symptoms: Yes fatigue No foot ulcerations  No appetite changes No nausea  No paresthesia of the feet  No polydipsia  No polyuria No visual disturbances   No vomiting     Home blood sugar records: fasting range: 80-110  Episodes of hypoglycemia? No    Current insulin regiment: none Most Recent Eye Exam: 03/10/2020 Current exercise: walking Current diet habits: well balanced  Pertinent Labs: Lab Results  Component Value Date   CHOL 109 12/30/2019   HDL 58 12/30/2019   LDLCALC 34 12/30/2019   TRIG 89 12/30/2019   CHOLHDL 1.9 12/30/2019   Lab Results  Component Value Date   NA 138 12/16/2019   K 3.2 (L) 12/16/2019   CREATININE 0.64 12/16/2019   GFRNONAA >60 12/16/2019   GFRAA >60 12/16/2019   GLUCOSE 149 (H) 12/16/2019     ---------------------------------------------------------------------------------------------------  Hypothyroid, follow-up  Lab Results  Component Value Date   TSH 1.070 12/30/2019   TSH 1.890  03/13/2019   TSH 0.967 06/18/2018   FREET4 1.43 03/13/2019   T4TOTAL 10.1 05/12/2017   Wt Readings from Last 3 Encounters:  06/02/20 130 lb (59 kg)  05/12/20 126 lb (57.2 kg)  05/04/20 129 lb 12.8 oz (58.9 kg)     She was last seen for hypothyroid 5 months ago.  Management since that visit includes continue same medication. She reports good compliance with treatment. She is not having side effects.   Symptoms: Yes change in energy level No constipation  No diarrhea No heat / cold intolerance  No nervousness No palpitations  No weight changes    ----------------------------------------------------------------------------------------- States she went to [facility] a few weeks ago for left leg pain which was thought to be a calf strain. Patient states pain started by waking her up during a nap, but has been getting better.      Medications: Outpatient Medications Prior to Visit  Medication Sig  . acetaminophen (TYLENOL) 500 MG tablet Take 1,000 mg by mouth 2 (two) times daily as needed for mild pain or moderate pain.  . Alcohol Swabs (B-D SINGLE USE SWABS REGULAR) PADS USE TO CHECK BLOOD SUGAR DAILY  . aspirin EC 81 MG tablet Take 81 mg by mouth daily.  Marland Kitchen atorvastatin (LIPITOR) 20 MG tablet TAKE 1 TABLET EVERY DAY  . carvedilol (COREG) 3.125 MG tablet TAKE 1/2 TABLET BY MOUTH TWICE DAILY (DOSE CHANGE)  . Cholecalciferol (VITAMIN D) 125 MCG (5000 UT) CAPS Take 5,000 mg  by mouth once a week.  . ferrous sulfate 325 (65 FE) MG tablet Take 325 mg by mouth 2 (two) times daily with a meal.  . furosemide (LASIX) 40 MG tablet Take 40 mg by mouth daily.   Marland Kitchen glipiZIDE (GLUCOTROL XL) 2.5 MG 24 hr tablet TAKE 1 TABLET EVERY DAY WITH BREAKFAST  . glucose blood (TRUE METRIX BLOOD GLUCOSE TEST) test strip CHECK BLOOD SUGAR EVERY DAY  . JANUMET XR [phone] MG TB24 TAKE 1 TABLET EVERY DAY  . Multiple Vitamin (MULTIVITAMIN) tablet Take 1 tablet by mouth daily.  . pantoprazole (PROTONIX) 40 MG tablet TAKE  1 TABLET (40 MG TOTAL) BY MOUTH DAILY.  . sacubitril-valsartan (ENTRESTO) 24-26 MG TAKE 1/2 TABLET EVERY 12 HOURS (TWICE DAILY)  . senna (SENOKOT) 8.6 MG TABS tablet Take 1 tablet by mouth daily as needed for mild constipation.  Marland Kitchen SYNTHROID 50 MCG tablet TAKE 1 TABLET EVERY DAY  . TRUEplus Lancets 33G MISC CHECK BLOOD SUGAR ONE TIME DAILY  . [DISCONTINUED] chlorhexidine (PERIDEX) 0.12 % solution 15 mLs 2 (two) times daily. (Patient not taking: Reported on 06/02/2020)   No facility-administered medications prior to visit.    Review of Systems  Constitutional: Negative for appetite change, chills, fatigue and fever.  Respiratory: Positive for shortness of breath. Negative for chest tightness.   Cardiovascular: Positive for leg swelling (in ankles and lower legs). Negative for chest pain and palpitations.  Gastrointestinal: Negative for abdominal pain, nausea and vomiting.  Neurological: Positive for weakness (in legs). Negative for dizziness.      Objective    BP 111/71 (BP Location: Left Arm, Patient Position: Sitting, Cuff Size: Normal)   Pulse 69   Temp (!) 96.8 F (36 C) (Temporal)   Resp 16   Wt 130 lb (59 kg)   LMP  (LMP Unknown)   BMI 24.56 kg/m    Physical Exam   General: Appearance:    Well developed, well nourished female in no acute distress  Eyes:    PERRL, conjunctiva/corneas clear, EOM's intact       Lungs:     Clear to auscultation bilaterally, respirations unlabored  Heart:    Normal heart rate. Normal rhythm.  2/6 systolic murmur at right upper sternal border  MS:   All extremities are intact.   Neurologic:   Awake, alert, oriented x 3. No apparent focal neurological           defect.         Results for orders placed or performed in visit on 06/02/20  POCT HgB A1C  Result Value Ref Range   Hemoglobin A1C 7.4 (A) 4.0 - 5.6 %   Est. average glucose Bld gHb Est-mCnc 166   POCT UA - Microalbumin  Result Value Ref Range   Microalbumin Ur, POC negative mg/L     Assessment & Plan     1. Type 2 diabetes mellitus without complication, without long-term current use of insulin (HCC) Will replace glipizide with farxiga for better glycemic control and CHF - dapagliflozin propanediol (FARXIGA) 5 MG TABS tablet; Take 1 tablet (5 mg total) by mouth daily before breakfast.  Dispense: 28 tablet; Refill: 0  2. Chronic systolic heart failure (HCC) Try samples Farxiga as above. Consider titrating up to 10mg  at follow up in 4 weeks if tolerating and if not hypotensive.  - Comprehensive metabolic panel - Magnesium - CBC - dapagliflozin propanediol (FARXIGA) 5 MG TABS tablet; Take 1 tablet (5 mg total) by mouth daily before breakfast.  Dispense: 28 tablet; Refill: 0  Advised may reduce - furosemide (LASIX) 40 MG tablet; Take 0.5 if swelling improves with addition of Farxiga. .  3. Numbness and tingling  - Vitamin B12  4. . Adult hypothyroidism  - TSH   Future Appointments  Date Time Provider Department Center  07/01/2020 10:40 AM Malva Limes, MD [facility]  07/08/2020  8:45 AM [facility] DEVICE REMOTES [facility]  08/27/2020  1:15 PM Helane Gunther, DPM [facility]  10/07/2020  8:45 AM [facility] DEVICE REMOTES [facility]  01/06/2021  8:45 AM [facility] DEVICE REMOTES [facility]  04/07/2021  8:45 AM [facility] DEVICE REMOTES [facility]  07/07/2021  8:45 AM [facility] DEVICE REMOTES [facility]         The entirety of the information documented in the History of Present Illness, Review of Systems and Physical Exam were personally obtained by me. Portions of this information were initially documented by the CMA and reviewed by me for thoroughness and accuracy.      [name], MD  [facility] [phone] (phone) [phone] (fax)  [facility]

## 2020-06-02 NOTE — Patient Instructions (Signed)
.   Please review the attached list of medications and notify my office if there are any errors.   . Start taking Farxiga 5mg  once daily in place of glipizide   If your swelling gets better, you can reduce furosemide to 1/2 tablet daily

## 2020-06-03 LAB — COMPREHENSIVE METABOLIC PANEL
ALT: 12 IU/L (ref 0–32)
AST: 20 IU/L (ref 0–40)
Albumin/Globulin Ratio: 1.7 (ref 1.2–2.2)
Albumin: 4.3 g/dL (ref 3.7–4.7)
Alkaline Phosphatase: 79 IU/L (ref 48–121)
BUN/Creatinine Ratio: 10 — ABNORMAL LOW (ref 12–28)
BUN: 8 mg/dL (ref 8–27)
Bilirubin Total: 0.3 mg/dL (ref 0.0–1.2)
CO2: 25 mmol/L (ref 20–29)
Calcium: 9.5 mg/dL (ref 8.7–10.3)
Chloride: 98 mmol/L (ref 96–106)
Creatinine, Ser: 0.78 mg/dL (ref 0.57–1.00)
GFR calc Af Amer: 87 mL/min/{1.73_m2} (ref >59)
GFR calc non Af Amer: 75 mL/min/{1.73_m2} (ref >59)
Globulin, Total: 2.5 g/dL (ref 1.5–4.5)
Glucose: 214 mg/dL — ABNORMAL HIGH (ref 65–99)
Potassium: 3.6 mmol/L (ref 3.5–5.2)
Sodium: 137 mmol/L (ref 134–144)
Total Protein: 6.8 g/dL (ref 6.0–8.5)

## 2020-06-03 LAB — CBC
Hematocrit: 33.3 % — ABNORMAL LOW (ref 34.0–46.6)
Hemoglobin: 10.7 g/dL — ABNORMAL LOW (ref 11.1–15.9)
MCH: 29.1 pg (ref 26.6–33.0)
MCHC: 32.1 g/dL (ref 31.5–35.7)
MCV: 91 fL (ref 79–97)
Platelets: 126 10*3/uL — ABNORMAL LOW (ref 150–450)
RBC: 3.68 x10E6/uL — ABNORMAL LOW (ref 3.77–5.28)
RDW: 12.3 % (ref 11.7–15.4)
WBC: 3.9 10*3/uL (ref 3.4–10.8)

## 2020-06-03 LAB — VITAMIN B12: Vitamin B-12: 377 pg/mL (ref 232–1245)

## 2020-06-03 LAB — MAGNESIUM: Magnesium: 1.5 mg/dL — ABNORMAL LOW (ref 1.6–2.3)

## 2020-06-03 LAB — TSH: TSH: 2.92 u[IU]/mL (ref 0.450–4.500)

## 2020-06-04 ENCOUNTER — Telehealth: Payer: Self-pay | Admitting: Family Medicine

## 2020-06-04 ENCOUNTER — Other Ambulatory Visit: Payer: Self-pay

## 2020-06-04 NOTE — Telephone Encounter (Signed)
Are you ok with her taking 500 mg instead of 400.  I was going to send it in as a 400mg  prescription but it looks like her insurance will not cover it.

## 2020-06-04 NOTE — Telephone Encounter (Signed)
Pt states that she was advised to start taking Magnesium Oxide 500 Mg. Pt states that she has been only able to find it in 400mg . Please advise.      [id number], [place] - [address] AT [facility] OF [address]  [address] [place] [facility] [place]  Phone: [phone] Fax: [phone]  Hours: Not open 24 hours

## 2020-06-04 NOTE — Telephone Encounter (Signed)
Patient advised.

## 2020-06-04 NOTE — Telephone Encounter (Signed)
Yes, the 400mg  is fine. Thanks.

## 2020-06-26 ENCOUNTER — Other Ambulatory Visit: Payer: Self-pay | Admitting: Family Medicine

## 2020-06-29 ENCOUNTER — Other Ambulatory Visit: Payer: Self-pay

## 2020-06-29 DIAGNOSIS — E119 Type 2 diabetes mellitus without complications: Secondary | ICD-10-CM

## 2020-06-29 MED ORDER — TRUE METRIX LEVEL 1 LOW VI SOLN: 3 refills | Status: AC

## 2020-07-01 ENCOUNTER — Telehealth: Payer: Self-pay

## 2020-07-01 ENCOUNTER — Encounter: Payer: Self-pay | Admitting: Family Medicine

## 2020-07-01 ENCOUNTER — Other Ambulatory Visit: Payer: Self-pay

## 2020-07-01 ENCOUNTER — Ambulatory Visit (INDEPENDENT_AMBULATORY_CARE_PROVIDER_SITE_OTHER): Payer: Medicare HMO | Admitting: Family Medicine

## 2020-07-01 VITALS — BP 110/62 | HR 85 | Temp 97.8°F | Resp 15 | Wt 129.8 lb

## 2020-07-01 DIAGNOSIS — E1169 Type 2 diabetes mellitus with other specified complication: Secondary | ICD-10-CM | POA: Diagnosis not present

## 2020-07-01 DIAGNOSIS — I5022 Chronic systolic (congestive) heart failure: Secondary | ICD-10-CM

## 2020-07-01 DIAGNOSIS — R1312 Dysphagia, oropharyngeal phase: Secondary | ICD-10-CM | POA: Diagnosis not present

## 2020-07-01 DIAGNOSIS — E559 Vitamin D deficiency, unspecified: Secondary | ICD-10-CM

## 2020-07-01 MED ORDER — VITAMIN D 125 MCG (5000 UT) PO CAPS: 5000.0000 mg | ORAL_CAPSULE | ORAL | 4 refills | Status: DC

## 2020-07-01 NOTE — Telephone Encounter (Signed)
Copied from CRM [id number]. Topic: General - Other >> Jul 01, 2020 11:50 AM Dalphine Handing A wrote: Patient wants a callback from [facility] in regards to her medication. Patient stated that her avs notates a medication that she isnt familiar with and stated she doesn't think its the samples she was given today. Patient also stated that the vitamin D was sent to incorrect pharmacy and she needs the medication to go to [facility] pharmacy. Please advise

## 2020-07-01 NOTE — Progress Notes (Signed)
Established patient visit   Patient: Bethany [name]   DOB: [birth year]   74 y.o. Female  MRN: [id number] Visit Date: 07/01/2020  Today's healthcare provider: [name], MD   Chief Complaint  Patient presents with  . Diabetes  . Follow-up   Subjective    HPI   Follow up for chronic systolic heart failure  The patient was last seen for this 4 weeks ago. Changes made at last visit include patient was started on Farxiga 5mg  and was told to reduce Lasix 40mg  to 20mg  of swelling improves. she states she was having a lot of trouble with fatigue, polydipsia, and polyuria when she started , but she cut dose of furosemide to 1/2 of a 40mg  tablet about a week and is doing better. She hasn't noticed any improvement with her breathing. Is not having any swelling. She reports fasting sugars are a little higher, but still only running 110s to 120s. Her a1c at that visit had gone up to 7.4.   She reports excellent compliance with treatment. She feels that condition is stable. She is not having side effects.   -----------------------------------------------------------------------------------------  Diabetes Mellitus Type II, Follow-up  Lab Results  Component Value Date   HGBA1C 7.4 (A) 06/02/2020   HGBA1C 6.8 (A) 12/30/2019   HGBA1C 7.2 (A) 08/19/2019   Wt Readings from Last 3 Encounters:  07/01/20 129 lb 12.8 oz (58.9 kg)  06/02/20 130 lb (59 kg)  05/12/20 126 lb (57.2 kg)   Last seen for diabetes 4 weeks ago.  Management since then includes starting patient on Farxiga 5mg  in replace of Glipizide, for better glycemic control. She reports excellent compliance with treatment. She is having side effects. Patient reports frequent urination Symptoms: Yes fatigue No foot ulcerations  No appetite changes No nausea  Yes paresthesia of the feet  No polydipsia  Yes polyuria Yes visual disturbances   No vomiting     Home blood sugar records: fasting range: 90s , patient  reports that now since starting Farxiga blood sugar is between 110-120  Episodes of hypoglycemia? No    Current insulin regiment: none Most Recent Eye Exam: 03/10/2020 Current exercise: walking Current diet habits: well balanced  Pertinent Labs: Lab Results  Component Value Date   CHOL 109 12/30/2019   HDL 58 12/30/2019   LDLCALC 34 12/30/2019   TRIG 89 12/30/2019   CHOLHDL 1.9 12/30/2019   Lab Results  Component Value Date   NA 137 06/02/2020   K 3.6 06/02/2020   CREATININE 0.78 06/02/2020   GFRNONAA 75 06/02/2020   GFRAA 87 06/02/2020   GLUCOSE 214 (H) 06/02/2020     --------------------------------------------------------------------------------------------------- She also complains of difficulty swallowing food sometimes, feels like she develops a lump in her throat. No sensation of food getting stuck.   Social History   Tobacco Use  . Smoking status: Never Smoker  . Smokeless tobacco: Former 06/04/2020    Types: Snuff  Vaping Use  . Vaping Use: Never used  Substance Use Topics  . Alcohol use: No    Alcohol/week: 0.0 standard drinks  . Drug use: No   Allergies  Allergen Reactions  . Other Other (See Comments)    NO BLOOD PRODUCTS- JEHOVAHS WITNESS  . Sulfa Antibiotics Other (See Comments)    Loss of taste       Medications: Outpatient Medications Prior to Visit  Medication Sig  . acetaminophen (TYLENOL) 500 MG tablet Take 1,000 mg by mouth 2 (two) times daily as  needed for mild pain or moderate pain.  . Alcohol Swabs (B-D SINGLE USE SWABS REGULAR) PADS USE TO CHECK BLOOD SUGAR DAILY  . aspirin EC 81 MG tablet Take 81 mg by mouth daily.  Marland Kitchen atorvastatin (LIPITOR) 20 MG tablet TAKE 1 TABLET EVERY DAY  . Blood Glucose Calibration (TRUE METRIX LEVEL 1) Low SOLN Use as directed with glucose meter  . carvedilol (COREG) 3.125 MG tablet TAKE 1/2 TABLET BY MOUTH TWICE DAILY (DOSE CHANGE)  . Cholecalciferol (VITAMIN D) 125 MCG (5000 UT) CAPS Take 5,000 mg by mouth once  a week.  . dapagliflozin propanediol (FARXIGA) 5 MG TABS tablet Take 1 tablet (5 mg total) by mouth daily before breakfast.  . ferrous sulfate 325 (65 FE) MG tablet Take 325 mg by mouth 2 (two) times daily with a meal.  . furosemide (LASIX) 40 MG tablet Take 0.5-1 tablets (20-40 mg total) by mouth daily.  Marland Kitchen glucose blood (TRUE METRIX BLOOD GLUCOSE TEST) test strip CHECK BLOOD SUGAR EVERY DAY  . JANUMET XR [phone] MG TB24 TAKE 1 TABLET EVERY DAY  . Magnesium 400 MG TABS Take 400 mg by mouth daily.  . Multiple Vitamin (MULTIVITAMIN) tablet Take 1 tablet by mouth daily.  . pantoprazole (PROTONIX) 40 MG tablet TAKE 1 TABLET (40 MG TOTAL) BY MOUTH DAILY.  . sacubitril-valsartan (ENTRESTO) 24-26 MG TAKE 1/2 TABLET EVERY 12 HOURS (TWICE DAILY)  . senna (SENOKOT) 8.6 MG TABS tablet Take 1 tablet by mouth daily as needed for mild constipation.  Marland Kitchen SYNTHROID 50 MCG tablet TAKE 1 TABLET EVERY DAY  . TRUEplus Lancets 33G MISC CHECK BLOOD SUGAR ONE TIME DAILY   No facility-administered medications prior to visit.    Review of Systems    Objective    BP 110/62   Pulse 85   Temp 97.8 F (36.6 C) (Oral)   Resp 15   Wt 129 lb 12.8 oz (58.9 kg)   LMP  (LMP Unknown)   SpO2 95%   BMI 24.53 kg/m    Physical Exam  General appearance: Well developed, well nourished female, cooperative and in no acute distress Head: Normocephalic, without obvious abnormality, atraumatic. No neck masses or tenderness.  Respiratory: Respirations even and unlabored, normal respiratory rate Extremities: All extremities are intact.  Skin: Skin color, texture, turgor normal. No rashes seen  Psych: Appropriate mood and affect. Neurologic: Mental status: Alert, oriented to person, place, and time, thought content appropriate.     Assessment & Plan     1. Type 2 diabetes mellitus with other specified complication, without long-term current use of insulin (HCC) With CHF as below. Recent initiation of low dose Marcelline Deist  which should help with CHF over time. Is tolerating Marcelline Deist fairly well. Check labs as below. Will need refill if electrolytes and kidney functions are stable.   2. Chronic systolic heart failure (HCC) Has reduced furosemide to 1/2 tablet daily since starting Farxiga, but advised she could take 1 tablet a day as needed.  - Renal function panel - Magnesium  3. Vitamin D deficiency Refill.  - Cholecalciferol (VITAMIN D) 125 MCG (5000 UT) CAPS; Take 5,000 mg by mouth once a week.  Dispense: 12 capsule; Refill: 4  4. Oropharyngeal dysphagia  - SLP modified barium swallow; Future  5. Hypomagnesemia  - Magnesium   No follow-ups on file.      The entirety of the information documented in the History of Present Illness, Review of Systems and Physical Exam were personally obtained by me. Portions of  this information were initially documented by the CMA and reviewed by me for thoroughness and accuracy.      Lelon Huh, MD  [facility] [phone] (phone) [phone] (fax)  [place]

## 2020-07-01 NOTE — Telephone Encounter (Signed)
Bethany [name], will you follow up with this patient? I believe you checked her in today and may know more of what medication changes were made during the visit than I do.

## 2020-07-02 ENCOUNTER — Telehealth: Payer: Self-pay

## 2020-07-02 DIAGNOSIS — E119 Type 2 diabetes mellitus without complications: Secondary | ICD-10-CM

## 2020-07-02 DIAGNOSIS — I5022 Chronic systolic (congestive) heart failure: Secondary | ICD-10-CM

## 2020-07-02 LAB — RENAL FUNCTION PANEL
Albumin: 4.1 g/dL (ref 3.7–4.7)
BUN/Creatinine Ratio: 8 — ABNORMAL LOW (ref 12–28)
BUN: 7 mg/dL — ABNORMAL LOW (ref 8–27)
CO2: 24 mmol/L (ref 20–29)
Calcium: 9.7 mg/dL (ref 8.7–10.3)
Chloride: 102 mmol/L (ref 96–106)
Creatinine, Ser: 0.84 mg/dL (ref 0.57–1.00)
GFR calc Af Amer: 79 mL/min/{1.73_m2} (ref >59)
GFR calc non Af Amer: 69 mL/min/{1.73_m2} (ref >59)
Glucose: 168 mg/dL — ABNORMAL HIGH (ref 65–99)
Phosphorus: 2.8 mg/dL — ABNORMAL LOW (ref 3.0–4.3)
Potassium: 3.8 mmol/L (ref 3.5–5.2)
Sodium: 139 mmol/L (ref 134–144)

## 2020-07-02 LAB — MAGNESIUM: Magnesium: 1.8 mg/dL (ref 1.6–2.3)

## 2020-07-02 MED ORDER — DAPAGLIFLOZIN PROPANEDIOL 5 MG PO TABS: 5.0000 mg | ORAL_TABLET | Freq: Every day | ORAL | 3 refills | Status: DC

## 2020-07-02 NOTE — Telephone Encounter (Signed)
Called and spoke with patient who wanted clarification on barium swallow test, and I let her know that our office will be in contact with hr when appointment is made for this test. Bethany [name]

## 2020-07-02 NOTE — Telephone Encounter (Signed)
-----   Message from Malva Limes, MD sent at 07/02/2020  9:09 AM EDT ----- Labs are very good. She should continue 47m farxiga. Will send prescription to her pharmacy. Please schedule diabetes in about 2 months, sometime after 09-02-20

## 2020-07-02 NOTE — Telephone Encounter (Signed)
Prescription has been sent to [facility] 

## 2020-07-02 NOTE — Telephone Encounter (Signed)
Patient advised and verbalized understanding. Follow up appointment scheduled for 09/07/2020 at 8:20am. Patient would like Farxiga prescription sent to [facility] mail order pharmacy. Should I send in a prescription now or were you going to wait to send it in on a later date? She has the samples we gave her during the office visit.

## 2020-07-08 ENCOUNTER — Other Ambulatory Visit: Payer: Self-pay | Admitting: Family Medicine

## 2020-07-08 ENCOUNTER — Ambulatory Visit (INDEPENDENT_AMBULATORY_CARE_PROVIDER_SITE_OTHER): Payer: Medicare HMO | Admitting: *Deleted

## 2020-07-08 DIAGNOSIS — I428 Other cardiomyopathies: Secondary | ICD-10-CM | POA: Diagnosis not present

## 2020-07-08 DIAGNOSIS — E039 Hypothyroidism, unspecified: Secondary | ICD-10-CM

## 2020-07-09 LAB — CUP PACEART REMOTE DEVICE CHECK
Battery Remaining Longevity: 16 mo
Battery Voltage: 2.89 V
Brady Statistic AP VP Percent: 0.02 %
Brady Statistic AP VS Percent: 0.01 %
Brady Statistic AS VP Percent: 98.69 %
Brady Statistic AS VS Percent: 1.28 %
Brady Statistic RA Percent Paced: 0.04 %
Brady Statistic RV Percent Paced: 3.95 %
Date Time Interrogation Session: 20210901033425
HighPow Impedance: 58 Ohm
Implantable Lead Implant Date: 20160409
Implantable Lead Implant Date: 20160409
Implantable Lead Implant Date: 20160409
Implantable Lead Location: 753858
Implantable Lead Location: 753859
Implantable Lead Location: 753860
Implantable Lead Model: 4598
Implantable Lead Model: 5076
Implantable Pulse Generator Implant Date: 20160409
Lead Channel Impedance Value: 1007 Ohm
Lead Channel Impedance Value: 1140 Ohm
Lead Channel Impedance Value: 1197 Ohm
Lead Channel Impedance Value: 1254 Ohm
Lead Channel Impedance Value: 1368 Ohm
Lead Channel Impedance Value: 1425 Ohm
Lead Channel Impedance Value: 342 Ohm
Lead Channel Impedance Value: 399 Ohm
Lead Channel Impedance Value: 456 Ohm
Lead Channel Impedance Value: 475 Ohm
Lead Channel Impedance Value: 646 Ohm
Lead Channel Impedance Value: 817 Ohm
Lead Channel Impedance Value: 874 Ohm
Lead Channel Pacing Threshold Amplitude: 0.5 V
Lead Channel Pacing Threshold Amplitude: 0.625 V
Lead Channel Pacing Threshold Amplitude: 2 V
Lead Channel Pacing Threshold Pulse Width: 0.4 ms
Lead Channel Pacing Threshold Pulse Width: 0.4 ms
Lead Channel Pacing Threshold Pulse Width: 0.8 ms
Lead Channel Sensing Intrinsic Amplitude: 0.5 mV
Lead Channel Sensing Intrinsic Amplitude: 0.5 mV
Lead Channel Sensing Intrinsic Amplitude: 9.875 mV
Lead Channel Sensing Intrinsic Amplitude: 9.875 mV
Lead Channel Setting Pacing Amplitude: 1.5 V
Lead Channel Setting Pacing Amplitude: 2 V
Lead Channel Setting Pacing Amplitude: 2.75 V
Lead Channel Setting Pacing Pulse Width: 0.4 ms
Lead Channel Setting Pacing Pulse Width: 0.8 ms
Lead Channel Setting Sensing Sensitivity: 0.45 mV

## 2020-07-10 NOTE — Progress Notes (Signed)
Remote ICD transmission.   

## 2020-07-17 ENCOUNTER — Other Ambulatory Visit: Payer: Self-pay | Admitting: Family Medicine

## 2020-07-17 DIAGNOSIS — R1310 Dysphagia, unspecified: Secondary | ICD-10-CM

## 2020-07-24 ENCOUNTER — Other Ambulatory Visit: Payer: Self-pay

## 2020-07-24 ENCOUNTER — Ambulatory Visit
Admission: RE | Admit: 2020-07-24 | Discharge: 2020-07-24 | Disposition: A | Discharge status: Home / Self Care | Payer: Medicare HMO | Source: Ambulatory Visit | Attending: Family Medicine | Admitting: Family Medicine

## 2020-07-24 DIAGNOSIS — R1310 Dysphagia, unspecified: Secondary | ICD-10-CM

## 2020-07-29 ENCOUNTER — Other Ambulatory Visit: Payer: Self-pay | Admitting: Family Medicine

## 2020-07-29 DIAGNOSIS — K219 Gastro-esophageal reflux disease without esophagitis: Secondary | ICD-10-CM

## 2020-08-12 ENCOUNTER — Telehealth: Payer: Self-pay | Admitting: *Deleted

## 2020-08-12 NOTE — Chronic Care Management (AMB) (Signed)
  Chronic Care Management   Note  08/12/2020 Name: Bethany [name] MRN: [id number] DOB: 1946-01-24  Bethany [name] is a 74 y.o. year old female who is a primary care patient of Fisher, Kirstie Peri, MD. I reached out to Bethany [name] by phone today in response to a referral sent by [name]'s health plan.     Bethany [name] was given information about Chronic Care Management services today including:  1. CCM service includes personalized support from designated clinical staff supervised by her physician, including individualized plan of care and coordination with other care providers 2. 24/7 contact phone numbers for assistance for urgent and routine care needs. 3. Service will only be billed when office clinical staff spend 20 minutes or more in a month to coordinate care. 4. Only one practitioner may furnish and bill the service in a calendar month. 5. The patient may stop CCM services at any time (effective at the end of the month) by phone call to the office staff. 6. The patient will be responsible for cost sharing (co-pay) of up to 20% of the service fee (after annual deductible is met).  Patient agreed to services and verbal consent obtained.   Follow up plan: Telephone appointment with care management team member scheduled for:09/11/2020  [place] [facility]

## 2020-08-27 ENCOUNTER — Encounter: Payer: Self-pay | Admitting: Podiatry

## 2020-08-27 ENCOUNTER — Ambulatory Visit: Payer: Medicare HMO | Admitting: Podiatry

## 2020-08-27 ENCOUNTER — Other Ambulatory Visit: Payer: Self-pay

## 2020-08-27 DIAGNOSIS — M79674 Pain in right toe(s): Secondary | ICD-10-CM

## 2020-08-27 DIAGNOSIS — E119 Type 2 diabetes mellitus without complications: Secondary | ICD-10-CM

## 2020-08-27 DIAGNOSIS — B351 Tinea unguium: Secondary | ICD-10-CM | POA: Diagnosis not present

## 2020-08-27 DIAGNOSIS — L600 Ingrowing nail: Secondary | ICD-10-CM

## 2020-08-27 NOTE — Progress Notes (Signed)
This patient returns to my office for at risk foot care.  This patient requires this care by a professional since this patient will be at risk due to having type 2 diabetes.  This patient is unable to cut nails herself since the patient cannot reach her nails.These nails are painful walking and wearing shoes.  This patient presents for at risk foot care today.  General Appearance  Alert, conversant and in no acute stress.  Vascular  Dorsalis pedis  are palpable  Bilaterally. Posterior tibial pulses are absent  B/L. Capillary return is within normal limits  bilaterally. Temperature is within normal limits  bilaterally.  Neurologic  Senn-Weinstein monofilament wire test within normal limits  bilaterally. Muscle power within normal limits bilaterally.  Nails Thick disfigured discolored nails with subungual debris  Right hallux.. No evidence of bacterial infection or drainage bilaterally.  Orthopedic  No limitations of motion  feet .  No crepitus or effusions noted.  No bony pathology or digital deformities noted.  Skin  normotropic skin with no porokeratosis noted bilaterally.  No signs of infections or ulcers noted.     Onychomycosis  Pain in right toes  Pain in left toes  Consent was obtained for treatment procedures.   Mechanical debridement of nail right hallux. performed with a nail nipper.  Filed with dremel without incident.    Return office visit   3 months                   Told patient to return for periodic foot care and evaluation due to potential at risk complications.   Helane Gunther DPM

## 2020-08-31 ENCOUNTER — Other Ambulatory Visit: Payer: Self-pay | Admitting: Family Medicine

## 2020-08-31 DIAGNOSIS — E119 Type 2 diabetes mellitus without complications: Secondary | ICD-10-CM

## 2020-08-31 NOTE — Telephone Encounter (Signed)
Requested medication (s) are due for refill today: yes   Requested medication (s) are on the active medication list: yes  Last refill:  07/06/20  Future visit scheduled:yes  Notes to clinic: no assigned protocol    Requested Prescriptions  Pending Prescriptions Disp Refills   Alcohol Swabs (B-D SINGLE USE SWABS REGULAR) PADS [Pharmacy Med Name: BD SWABS SINGLE USE   Pad] 100 each 4    Sig: USE TO CHECK BLOOD SUGAR DAILY      Off-Protocol Failed - 08/31/2020  2:28 PM      Failed - Medication not assigned to a protocol, review manually.      Passed - Valid encounter within last 12 months    Recent Outpatient Visits           2 months ago Type 2 diabetes mellitus with other specified complication, without long-term current use of insulin (HCC)   [facility] Malva Limes, MD   3 months ago Type 2 diabetes mellitus without complication, without long-term current use of insulin [facility])   [facility] Malva Limes, MD   3 months ago Neck pain on left side   [facility] Malva Limes, MD   8 months ago Type 2 diabetes mellitus without complication, without long-term current use of insulin [facility])   [facility] Malva Limes, MD   1 year ago Type 2 diabetes mellitus without complication, without long-term current use of insulin [facility])   [facility] Malva Limes, MD       Future Appointments             In 2 weeks Fisher, Demetrios Isaacs, MD [facility], [facility]

## 2020-09-02 DIAGNOSIS — I5022 Chronic systolic (congestive) heart failure: Secondary | ICD-10-CM | POA: Diagnosis not present

## 2020-09-02 DIAGNOSIS — E782 Mixed hyperlipidemia: Secondary | ICD-10-CM | POA: Diagnosis not present

## 2020-09-02 DIAGNOSIS — I10 Essential (primary) hypertension: Secondary | ICD-10-CM | POA: Diagnosis not present

## 2020-09-02 DIAGNOSIS — I447 Left bundle-branch block, unspecified: Secondary | ICD-10-CM | POA: Diagnosis not present

## 2020-09-07 ENCOUNTER — Ambulatory Visit: Payer: Self-pay | Admitting: Family Medicine

## 2020-09-08 ENCOUNTER — Other Ambulatory Visit: Payer: Self-pay

## 2020-09-08 ENCOUNTER — Encounter: Payer: Self-pay | Admitting: Internal Medicine

## 2020-09-08 ENCOUNTER — Ambulatory Visit: Payer: Medicare HMO | Admitting: Internal Medicine

## 2020-09-08 VITALS — BP 120/66 | HR 69 | Ht 61.0 in | Wt 123.0 lb

## 2020-09-08 DIAGNOSIS — I447 Left bundle-branch block, unspecified: Secondary | ICD-10-CM | POA: Diagnosis not present

## 2020-09-08 DIAGNOSIS — I428 Other cardiomyopathies: Secondary | ICD-10-CM | POA: Diagnosis not present

## 2020-09-08 DIAGNOSIS — Z95810 Presence of automatic (implantable) cardiac defibrillator: Secondary | ICD-10-CM | POA: Diagnosis not present

## 2020-09-08 DIAGNOSIS — I5022 Chronic systolic (congestive) heart failure: Secondary | ICD-10-CM

## 2020-09-08 NOTE — Patient Instructions (Signed)
Medication Instructions:  - Your physician recommends that you continue on your current medications as directed. Please refer to the Current Medication list given to you today.    *If you need a refill on your cardiac medications before your next appointment, please call your pharmacy*   Lab Work: - none ordered  If you have labs (blood work) drawn today and your tests are completely normal, you will receive your results only by: . MyChart Message (if you have MyChart) OR . A paper copy in the mail If you have any lab test that is abnormal or we need to change your treatment, we will call you to review the results.   Testing/Procedures: - none ordered   Follow-Up: At [facility], you and your health needs are our priority.  As part of our continuing mission to provide you with exceptional heart care, we have created designated Provider Care Teams.  These Care Teams include your primary Cardiologist (physician) and Advanced Practice Providers (APPs -  Physician Assistants and Nurse Practitioners) who all work together to provide you with the care you need, when you need it.  We recommend signing up for the patient portal called "MyChart".  Sign up information is provided on this After Visit Summary.  MyChart is used to connect with patients for Virtual Visits (Telemedicine).  Patients are able to view lab/test results, encounter notes, upcoming appointments, etc.  Non-urgent messages can be sent to your provider as well.   To learn more about what you can do with MyChart, go to [email or web address].    Your next appointment:   9 month(s)  The format for your next appointment:   In Person  Provider:   Steven Klein, MD   Other Instructions n/a  

## 2020-09-08 NOTE — Progress Notes (Signed)
ELECTROPHYSIOLOGY Clinic  NOTE  Patient ID: Bethany [name], MRN: [id number], DOB/AGE: 74-03-1946 74 y.o. Admit date: (Not on file) Date of Consult: 09/08/2020  Primary Physician: Birdie Sons, MD Primary Cardiologist: BK   Chief Complaint: Defibrillator   HPI Bethany [name] is a 74 y.o. female in follow-up for CRT-D implantation 4/16 for nonischemic cardiomyopathy    DATE TEST EF   2012 Echo  20-25%   2012 LHC  Normal CAs  3/16 Echo   15-20 %   3/17 Echo  35%          She has been followed by Dr. Sharlette Dense at [facility].  Dapagliflozin was started recently  C/o fatigue  May be better since she has moved in with her sister --- less lonely    Date  Cr K  5/19 .75 3.8   8/21 0.84 3.8     Past Medical History:  Diagnosis Date  . AICD (automatic cardioverter/defibrillator) present   . Anemia   . Anxiety   . CHF (congestive heart failure) ([place])   . Complication of anesthesia   . Depression   . Diabetes ([place])   . Dilated cardiomyopathy ([place])   . Diverticulosis   . Dyspnea   . Dysrhythmia   . Edema    FEET/ANKLES OCCAS  . GERD (gastroesophageal reflux disease)   . Hyperlipidemia   . Hypothyroidism   . LV dysfunction   . Orthopnea    USES 3 PILLOWS  . PONV (postoperative nausea and vomiting)   . Reflux   . Thrombocytopenia ([place])   . Thyroid disease   . VHD (valvular heart disease)       Surgical History:  Past Surgical History:  Procedure Laterality Date  . ABDOMINAL HYSTERECTOMY    . BI-VENTRICULAR IMPLANTABLE CARDIOVERTER DEFIBRILLATOR [name] 02/11/2015   Procedure: BI-VENTRICULAR IMPLANTABLE CARDIOVERTER DEFIBRILLATOR  (CRT-D);  Surgeon: Deboraha Sprang, MD;  Location: [facility] CATH LAB;  Service: Cardiovascular;  Laterality: [name];  . BONE MARROW BIOPSY  2007   spine  . BREAST BIOPSY Right    neg  . CARDIAC CATHETERIZATION    . CATARACT EXTRACTION W/PHACO Right 01/03/2017   Procedure: CATARACT EXTRACTION PHACO AND INTRAOCULAR LENS PLACEMENT (IOC);  Surgeon: Leandrew Koyanagi, MD;  Location: [facility] ORS;  Service: Ophthalmology;  Laterality: Right;  [place] 00:50 AP% 19.5 CDE 9.94 fluid pack lot # [id number]  . CATARACT EXTRACTION W/PHACO Left 10/27/2016   Procedure: CATARACT EXTRACTION PHACO AND INTRAOCULAR LENS PLACEMENT (IOC);  Surgeon: Leandrew Koyanagi, MD;  Location: [facility] ORS;  Service: Ophthalmology;  Laterality: Left;  [place]  01:12 AP%  21.0 CDE  11.59 Fluid pack lot # [id number]  . CHOLECYSTECTOMY    . INSERT / REPLACE / REMOVE PACEMAKER     AICD  . LEAD REVISION [name] 02/11/2015   Procedure: LEAD REVISION;  Surgeon: Deboraha Sprang, MD;  Location: [facility] CATH LAB;  Service: Cardiovascular;  Laterality: [name];  . PARTIAL HYSTERECTOMY  1977  . TOOTH EXTRACTION  09/23/2015     Home Meds: Current Outpatient Medications on File Prior to Visit  Medication Sig Dispense Refill  . acetaminophen (TYLENOL) 500 MG tablet Take 1,000 mg by mouth 2 (two) times daily as needed for mild pain or moderate pain.    . Alcohol Swabs (B-D SINGLE USE SWABS REGULAR) PADS USE TO CHECK BLOOD SUGAR DAILY 100 each 4  . aspirin EC 81 MG tablet Take 81 mg by mouth daily.    Marland Kitchen atorvastatin (LIPITOR) 20 MG  tablet TAKE 1 TABLET EVERY DAY 90 tablet 3  . Blood Glucose Calibration (TRUE METRIX LEVEL 1) Low SOLN Use as directed with glucose meter 1 each 3  . carvedilol (COREG) 3.125 MG tablet TAKE 1/2 TABLET BY MOUTH TWICE DAILY (DOSE CHANGE) 90 tablet 1  . dapagliflozin propanediol (FARXIGA) 5 MG TABS tablet Take 1 tablet (5 mg total) by mouth daily before breakfast. 90 tablet 3  . ferrous sulfate 325 (65 FE) MG tablet Take 325 mg by mouth 2 (two) times daily with a meal.    . furosemide (LASIX) 40 MG tablet Take 0.5-1 tablets (20-40 mg total) by mouth daily.    Marland Kitchen glucose blood (TRUE METRIX BLOOD GLUCOSE TEST) test strip CHECK BLOOD SUGAR EVERY DAY 100 each 3  . JANUMET XR [phone] MG TB24 TAKE 1 TABLET EVERY DAY 90 tablet 1  . Magnesium 400 MG TABS Take 400 mg by mouth daily.    . Multiple  Vitamin (MULTIVITAMIN) tablet Take 1 tablet by mouth daily.    . pantoprazole (PROTONIX) 40 MG tablet TAKE 1 TABLET (40 MG TOTAL) BY MOUTH DAILY. 90 tablet 1  . sacubitril-valsartan (ENTRESTO) 24-26 MG TAKE 1/2 TABLET EVERY 12 HOURS (TWICE DAILY)    . senna (SENOKOT) 8.6 MG TABS tablet Take 1 tablet by mouth daily as needed for mild constipation.    Marland Kitchen SYNTHROID 50 MCG tablet TAKE 1 TABLET EVERY DAY 90 tablet 1  . TRUEplus Lancets 33G MISC CHECK BLOOD SUGAR ONE TIME DAILY 100 each 4  . VITAMIN D, CHOLECALCIFEROL, PO Take by mouth.     No current facility-administered medications on file prior to visit.      Allergies:  Allergies  Allergen Reactions  . Other Other (See Comments)    NO BLOOD PRODUCTS- JEHOVAHS WITNESS  . Sulfa Antibiotics Other (See Comments)    Loss of taste       ROS:  Please see the history of present illness.     All other systems reviewed and negative.   BP 120/66 (BP Location: Left Arm, Patient Position: Sitting, Cuff Size: Normal)   Ht '5\' 1"'  (1.549 m)   Wt 123 lb (55.8 kg)   LMP  (LMP Unknown)   SpO2 94%   BMI 23.24 kg/m   Well developed and well nourished in no acute distress HENT normal Neck supple with JVP-flat Clear Device pocket well healed; without hematoma or erythema.  There is no tethering  Regular rate and rhythm, no   murmur Abd-soft with active BS No Clubbing cyanosis   edema Skin-warm and dry A & Oriented  Grossly normal sensory and motor function  ECG sinus w P-synchronous/ AV  pacing @ 69 15/11/46    Assessment and Plan:   NICM  CHF systolic chronic/acute class 2B   LBBB  Fatigue  CRT D implanted 4/16   Euvolemic continue current meds  Still fatigued but better now taht she is living w her sister    Virl Axe

## 2020-09-11 ENCOUNTER — Telehealth: Payer: Medicare HMO

## 2020-09-11 NOTE — Progress Notes (Signed)
Established patient visit   Patient: Bethany [name]   DOB: [birth year]   74 y.o. Female  MRN: [id number] Visit Date: 09/14/2020  Today's healthcare provider: [name], MD   Chief Complaint  Patient presents with  . Diabetes   Subjective    HPI  Diabetes Mellitus Type II, Follow-up  Lab Results  Component Value Date   HGBA1C 7.4 (A) 06/02/2020   HGBA1C 6.8 (A) 12/30/2019   HGBA1C 7.2 (A) 08/19/2019   Wt Readings from Last 3 Encounters:  09/14/20 123 lb (55.8 kg)  09/08/20 123 lb (55.8 kg)  07/01/20 129 lb 12.8 oz (58.9 kg)   Last seen for diabetes 07/01/2020. Management since then includes continuing same medication.   She reports good compliance with treatment. She is not having side effects.  Symptoms: Yes fatigue No foot ulcerations  Yes appetite changes No nausea  Yes paresthesia of the feet  Yes polydipsia  Yes polyuria Yes visual disturbances   No vomiting     Home blood sugar records: fasting range: 108-110  Episodes of hypoglycemia? No    Current insulin regiment: none Most Recent Eye Exam: 03/10/2020 Current exercise: walking Current diet habits: well balanced  She does admit to not eating as healthy lately. Has been eating a lot of grapes and more pizza.  ---------------------------------------------------------------------------------------------------     Medications: Outpatient Medications Prior to Visit  Medication Sig  . acetaminophen (TYLENOL) 500 MG tablet Take 1,000 mg by mouth 2 (two) times daily as needed for mild pain or moderate pain.  . Alcohol Swabs (B-D SINGLE USE SWABS REGULAR) PADS USE TO CHECK BLOOD SUGAR DAILY  . aspirin EC 81 MG tablet Take 81 mg by mouth daily.  Marland Kitchen atorvastatin (LIPITOR) 20 MG tablet TAKE 1 TABLET EVERY DAY  . Blood Glucose Calibration (TRUE METRIX LEVEL 1) Low SOLN Use as directed with glucose meter  . carvedilol (COREG) 3.125 MG tablet TAKE 1/2 TABLET BY MOUTH TWICE DAILY (DOSE CHANGE)  .  cyanocobalamin 100 MCG tablet Take 100 mcg by mouth daily.  . dapagliflozin propanediol (FARXIGA) 5 MG TABS tablet Take 1 tablet (5 mg total) by mouth daily before breakfast.  . ferrous sulfate 325 (65 FE) MG tablet Take 325 mg by mouth 2 (two) times daily with a meal.  . furosemide (LASIX) 40 MG tablet Take 0.5-1 tablets (20-40 mg total) by mouth daily.  Marland Kitchen glucose blood (TRUE METRIX BLOOD GLUCOSE TEST) test strip CHECK BLOOD SUGAR EVERY DAY  . JANUMET XR [phone] MG TB24 TAKE 1 TABLET EVERY DAY  . Magnesium 400 MG TABS Take 400 mg by mouth daily.  . Multiple Vitamin (MULTIVITAMIN) tablet Take 1 tablet by mouth daily.  . pantoprazole (PROTONIX) 40 MG tablet TAKE 1 TABLET (40 MG TOTAL) BY MOUTH DAILY.  . sacubitril-valsartan (ENTRESTO) 24-26 MG TAKE 1/2 TABLET EVERY 12 HOURS (TWICE DAILY)  . senna (SENOKOT) 8.6 MG TABS tablet Take 1 tablet by mouth daily as needed for mild constipation.  Marland Kitchen SYNTHROID 50 MCG tablet TAKE 1 TABLET EVERY DAY  . TRUEplus Lancets 33G MISC CHECK BLOOD SUGAR ONE TIME DAILY  . VITAMIN D, CHOLECALCIFEROL, PO Take by mouth.   No facility-administered medications prior to visit.    Review of Systems  Constitutional: Positive for fatigue. Negative for appetite change, chills and fever.  Respiratory: Negative for chest tightness and shortness of breath.   Cardiovascular: Negative for chest pain and palpitations.  Gastrointestinal: Negative for abdominal pain, nausea and vomiting.  Endocrine: Positive  for polydipsia and polyuria.  Neurological: Negative for dizziness and weakness.      Objective    BP 103/69 (BP Location: Left Arm, Patient Position: Sitting, Cuff Size: Normal)   Pulse 85   Temp 98.4 F (36.9 C) (Oral)   Resp 16   Wt 123 lb (55.8 kg)   LMP  (LMP Unknown)   BMI 23.24 kg/m    Physical Exam   General: Appearance:    Well developed, well nourished female in no acute distress  Eyes:    PERRL, conjunctiva/corneas clear, EOM's intact       Lungs:      Clear to auscultation bilaterally, respirations unlabored  Heart:    Normal heart rate. Normal rhythm.  2/6 crescendo-decrescendo, systolic murmur at right upper sternal border  MS:   All extremities are intact.   Neurologic:   Awake, alert, oriented x 3. No apparent focal neurological           defect.        Results for orders placed or performed in visit on 09/14/20  POCT HgB A1C  Result Value Ref Range   Hemoglobin A1C 7.7 (A) 4.0 - 5.6 %   Est. average glucose Bld gHb Est-mCnc 174     Assessment & Plan     1. Type 2 diabetes mellitus without complication, without long-term current use of insulin (HCC) A1c is slowly rising. She thinks she can make some dietary improvement. She has been walking for exercise a few days per week and encouraged to continue doing so, and work up to per week.  Will consider adding additional metformin if A1c continues to rise at follow up in 4 months.   2. Chronic systolic heart failure (HCC) Doing well on current medication regiment. Encouraged regular excise and follow up as scheduled with cardiology.   3. Need for influenza vaccination  - Flu Vaccine QUAD High Dose IM (Fluad)  Follow up in 4 months.        The entirety of the information documented in the History of Present Illness, Review of Systems and Physical Exam were personally obtained by me. Portions of this information were initially documented by the CMA and reviewed by me for thoroughness and accuracy.      [name], MD  [facility] [phone] (phone) [phone] (fax)  [facility]

## 2020-09-14 ENCOUNTER — Encounter: Payer: Self-pay | Admitting: Family Medicine

## 2020-09-14 ENCOUNTER — Ambulatory Visit (INDEPENDENT_AMBULATORY_CARE_PROVIDER_SITE_OTHER): Payer: Medicare HMO | Admitting: Family Medicine

## 2020-09-14 ENCOUNTER — Other Ambulatory Visit: Payer: Self-pay

## 2020-09-14 VITALS — BP 103/69 | HR 85 | Temp 98.4°F | Resp 16 | Wt 123.0 lb

## 2020-09-14 DIAGNOSIS — Z23 Encounter for immunization: Secondary | ICD-10-CM | POA: Diagnosis not present

## 2020-09-14 DIAGNOSIS — I5022 Chronic systolic (congestive) heart failure: Secondary | ICD-10-CM

## 2020-09-14 DIAGNOSIS — E119 Type 2 diabetes mellitus without complications: Secondary | ICD-10-CM | POA: Diagnosis not present

## 2020-09-14 LAB — POCT GLYCOSYLATED HEMOGLOBIN (HGB A1C)
Est. average glucose Bld gHb Est-mCnc: 174
Hemoglobin A1C: 7.7 % — AB (ref 4.0–5.6)

## 2020-09-14 NOTE — Patient Instructions (Signed)
.   Please review the attached list of medications and notify my office if there are any errors.   The CDC recommends two doses of Shingrix (the shingles vaccine) separated by 2 to 6 months for adults age 74 years and older. I recommend checking with your pharmacy plan regarding coverage for this vaccine.     You are due for a Tdap (tetanus-diptheria-pertussis vaccine) which protects you from tetanus and whooping cough. Please check with your insurance plan or pharmacy regarding coverage for this vaccine.   

## 2020-10-02 ENCOUNTER — Other Ambulatory Visit: Payer: Self-pay | Admitting: Family Medicine

## 2020-10-02 DIAGNOSIS — E119 Type 2 diabetes mellitus without complications: Secondary | ICD-10-CM

## 2020-10-07 ENCOUNTER — Ambulatory Visit (INDEPENDENT_AMBULATORY_CARE_PROVIDER_SITE_OTHER): Payer: Medicare HMO

## 2020-10-07 DIAGNOSIS — I428 Other cardiomyopathies: Secondary | ICD-10-CM | POA: Diagnosis not present

## 2020-10-07 DIAGNOSIS — Z95810 Presence of automatic (implantable) cardiac defibrillator: Secondary | ICD-10-CM

## 2020-10-07 LAB — CUP PACEART REMOTE DEVICE CHECK
Battery Remaining Longevity: 13 mo
Battery Voltage: 2.88 V
Brady Statistic AP VP Percent: 0.01 %
Brady Statistic AP VS Percent: 0 %
Brady Statistic AS VP Percent: 98.71 %
Brady Statistic AS VS Percent: 1.28 %
Brady Statistic RA Percent Paced: 0.01 %
Brady Statistic RV Percent Paced: 1.82 %
Date Time Interrogation Session: 20211201001803
HighPow Impedance: 59 Ohm
Implantable Lead Implant Date: 20160409
Implantable Lead Implant Date: 20160409
Implantable Lead Implant Date: 20160409
Implantable Lead Location: 753858
Implantable Lead Location: 753859
Implantable Lead Location: 753860
Implantable Lead Model: 4598
Implantable Lead Model: 5076
Implantable Pulse Generator Implant Date: 20160409
Lead Channel Impedance Value: 1083 Ohm
Lead Channel Impedance Value: 1140 Ohm
Lead Channel Impedance Value: 1254 Ohm
Lead Channel Impedance Value: 1349 Ohm
Lead Channel Impedance Value: 1463 Ohm
Lead Channel Impedance Value: 1539 Ohm
Lead Channel Impedance Value: 399 Ohm
Lead Channel Impedance Value: 418 Ohm
Lead Channel Impedance Value: 513 Ohm
Lead Channel Impedance Value: 513 Ohm
Lead Channel Impedance Value: 665 Ohm
Lead Channel Impedance Value: 836 Ohm
Lead Channel Impedance Value: 931 Ohm
Lead Channel Pacing Threshold Amplitude: 0.5 V
Lead Channel Pacing Threshold Amplitude: 0.75 V
Lead Channel Pacing Threshold Amplitude: 2.125 V
Lead Channel Pacing Threshold Pulse Width: 0.4 ms
Lead Channel Pacing Threshold Pulse Width: 0.4 ms
Lead Channel Pacing Threshold Pulse Width: 0.8 ms
Lead Channel Sensing Intrinsic Amplitude: 0.625 mV
Lead Channel Sensing Intrinsic Amplitude: 0.625 mV
Lead Channel Sensing Intrinsic Amplitude: 9.75 mV
Lead Channel Sensing Intrinsic Amplitude: 9.75 mV
Lead Channel Setting Pacing Amplitude: 1.5 V
Lead Channel Setting Pacing Amplitude: 2 V
Lead Channel Setting Pacing Amplitude: 2.5 V
Lead Channel Setting Pacing Pulse Width: 0.4 ms
Lead Channel Setting Pacing Pulse Width: 1 ms
Lead Channel Setting Sensing Sensitivity: 0.45 mV

## 2020-10-08 ENCOUNTER — Other Ambulatory Visit: Payer: Self-pay | Admitting: Family Medicine

## 2020-10-08 DIAGNOSIS — R5383 Other fatigue: Secondary | ICD-10-CM

## 2020-10-08 DIAGNOSIS — I5022 Chronic systolic (congestive) heart failure: Secondary | ICD-10-CM

## 2020-10-15 NOTE — Progress Notes (Signed)
Remote ICD transmission.   

## 2020-11-10 ENCOUNTER — Telehealth: Payer: Self-pay | Admitting: *Deleted

## 2020-11-10 NOTE — Chronic Care Management (AMB) (Signed)
  Care Management   Note  11/10/2020 Name: LUANNA WEESNER MRN: [id number] DOB: 1946/07/02  Berenice Bouton Arabie is a 75 y.o. year old female who is a primary care patient of Sherrie Mustache, Demetrios Isaacs, MD and is actively engaged with the care management team. I reached out to Leanora Cover by phone today to assist with re-scheduling an initial visit with the Pharmacist.  Follow up plan: Unsuccessful telephone outreach attempt made. A HIPAA compliant phone message was left for the patient providing contact information and requesting a return call.  The care management team will reach out to the patient again over the next 7 days.  If patient returns call to provider office, please advise to call Embedded Care Management Care Guide Gwenevere Ghazi at [phone].  Gwenevere Ghazi  Care Guide, Embedded Care Coordination [facility]  Direct Dial: [phone]

## 2020-11-12 NOTE — Chronic Care Management (AMB) (Signed)
  Care Management   Note  11/12/2020 Name: Bethany [name] MRN: [id number] DOB: April 11, 1946  Bethany [name] is a 75 y.o. year old female who is a primary care patient of Sherrie Mustache, Demetrios Isaacs, MD and is actively engaged with the care management team. I reached out to Leanora Cover by phone today to assist with re-scheduling an initial visit with the Pharmacist  Follow up plan: Telephone appointment with care management team member scheduled for:12/04/2020  [facility] Guide, Embedded Care Coordination [facility]  Care Management

## 2020-11-27 ENCOUNTER — Telehealth: Payer: Self-pay

## 2020-11-27 NOTE — Progress Notes (Signed)
Chronic Care Management Pharmacy Assistant   Name: Bethany [name]  MRN: [id number] DOB: 07/09/1946  Reason for Encounter: Medication Review/Initial question for initial visit with clinical pharmacist.   Patient Questions:  1.  Have you seen any other providers since your last visit? No  2.  Any changes in your medicines or health? No   PCP : Malva Limes, MD  Allergies:   Allergies  Allergen Reactions  . Other Other (See Comments)    NO BLOOD PRODUCTS- JEHOVAHS WITNESS  . Sulfa Antibiotics Other (See Comments)    Loss of taste    Medications: Outpatient Encounter Medications as of 11/27/2020  Medication Sig  . acetaminophen (TYLENOL) 500 MG tablet Take 1,000 mg by mouth 2 (two) times daily as needed for mild pain or moderate pain.  . Alcohol Swabs (B-D SINGLE USE SWABS REGULAR) PADS USE TO CHECK BLOOD SUGAR DAILY  . aspirin EC 81 MG tablet Take 81 mg by mouth daily.  Marland Kitchen atorvastatin (LIPITOR) 20 MG tablet TAKE 1 TABLET EVERY DAY  . Blood Glucose Calibration (TRUE METRIX LEVEL 1) Low SOLN Use as directed with glucose meter  . carvedilol (COREG) 3.125 MG tablet TAKE 1/2 TABLET BY MOUTH TWICE DAILY (DOSE CHANGE)  . cyanocobalamin 100 MCG tablet Take 100 mcg by mouth daily.  . dapagliflozin propanediol (FARXIGA) 5 MG TABS tablet Take 1 tablet (5 mg total) by mouth daily before breakfast.  . ferrous sulfate 325 (65 FE) MG tablet Take 325 mg by mouth 2 (two) times daily with a meal.  . furosemide (LASIX) 40 MG tablet Take 0.5-1 tablets (20-40 mg total) by mouth daily.  Marland Kitchen glucose blood (TRUE METRIX BLOOD GLUCOSE TEST) test strip TEST BLOOD SUGAR EVERY DAY  . JANUMET XR [phone] MG TB24 TAKE 1 TABLET EVERY DAY  . Magnesium 400 MG TABS Take 400 mg by mouth daily.  . Multiple Vitamin (MULTIVITAMIN) tablet Take 1 tablet by mouth daily.  . pantoprazole (PROTONIX) 40 MG tablet TAKE 1 TABLET (40 MG TOTAL) BY MOUTH DAILY.  . sacubitril-valsartan (ENTRESTO) 24-26 MG TAKE 1/2 TABLET  EVERY 12 HOURS (TWICE DAILY)  . senna (SENOKOT) 8.6 MG TABS tablet Take 1 tablet by mouth daily as needed for mild constipation.  Marland Kitchen SYNTHROID 50 MCG tablet TAKE 1 TABLET EVERY DAY  . TRUEplus Lancets 33G MISC CHECK BLOOD SUGAR ONE TIME DAILY  . VITAMIN D, CHOLECALCIFEROL, PO Take by mouth.   No facility-administered encounter medications on file as of 11/27/2020.    Current Diagnosis: Patient Active Problem List   Diagnosis Date Noted  . Other neutropenia (HCC) 06/11/2019  . Thrombocytopenia (HCC) 06/11/2019  . Pain due to onychomycosis of toenail of right foot 05/30/2019  . Memory loss 07/12/2016  . GERD (gastroesophageal reflux disease) 08/24/2015  . Automatic implantable cardioverter-defibrillator in situ 07/29/2015  . Hypotension 06/18/2015  . Benign essential HTN 03/20/2015  . Absolute anemia 03/11/2015  . Abnormal liver enzymes 03/11/2015  . Decreased potassium in the blood 03/11/2015  . Hypercholesteremia 03/11/2015  . Adult hypothyroidism 03/11/2015  . Idiopathic insomnia 03/11/2015  . OP (osteoporosis) 03/11/2015  . Diabetes mellitus, type 2 (HCC) 03/11/2015  . Vitamin D deficiency 03/11/2015  . LBBB (left bundle branch block) 02/11/2015  . Chronic systolic heart failure (HCC) 09/25/2014  . Heart valve disease 04/10/2014    Goals Addressed   None    Left Voice message to do initial question prior to patient appointment on 12/04/2020 for CCM at 9:00 am  with  Angelena Sole the Clinical pharmacist.   Everlean Cherry Clinical Pharmacist Assistant [phone]   Follow-Up:  Pharmacist Review

## 2020-11-30 ENCOUNTER — Ambulatory Visit: Payer: Medicare HMO | Admitting: Podiatry

## 2020-11-30 ENCOUNTER — Encounter: Payer: Self-pay | Admitting: Podiatry

## 2020-11-30 ENCOUNTER — Other Ambulatory Visit: Payer: Self-pay

## 2020-11-30 DIAGNOSIS — M79674 Pain in right toe(s): Secondary | ICD-10-CM

## 2020-11-30 DIAGNOSIS — L600 Ingrowing nail: Secondary | ICD-10-CM

## 2020-11-30 DIAGNOSIS — B351 Tinea unguium: Secondary | ICD-10-CM

## 2020-11-30 DIAGNOSIS — E119 Type 2 diabetes mellitus without complications: Secondary | ICD-10-CM

## 2020-11-30 NOTE — Progress Notes (Signed)
This patient returns to my office for at risk foot care.  This patient requires this care by a professional since this patient will be at risk due to having type 2 diabetes and thrombocytopenia..  This patient is unable to cut nails herself since the patient cannot reach her nails.These nails are painful walking and wearing shoes.  This patient presents for at risk foot care today.  General Appearance  Alert, conversant and in no acute stress.  Vascular  Dorsalis pedis  are palpable  Bilaterally. Posterior tibial pulses are absent  B/L. Capillary return is within normal limits  bilaterally. Cold feet  Bilaterally.  Absent digital hair.  Neurologic  Senn-Weinstein monofilament wire test within normal limits  bilaterally. Muscle power within normal limits bilaterally.  Nails Thick disfigured discolored nails with subungual debris with incurvated nails both borders right hallux.. No evidence of bacterial infection or drainage bilaterally.  Orthopedic  No limitations of motion  feet .  No crepitus or effusions noted.  No bony pathology or digital deformities noted.  Skin  normotropic skin with no porokeratosis noted bilaterally.  No signs of infections or ulcers noted.     Onychomycosis  Pain in right toes  Pain in left toes  Consent was obtained for treatment procedures.   Mechanical debridement of nail right hallux. performed with a nail nipper.  Filed with dremel without incident.    Return office visit   3 months                   Told patient to return for periodic foot care and evaluation due to potential at risk complications.   Helane Gunther DPM

## 2020-12-04 ENCOUNTER — Ambulatory Visit: Payer: Medicare HMO

## 2020-12-04 DIAGNOSIS — I10 Essential (primary) hypertension: Secondary | ICD-10-CM

## 2020-12-04 DIAGNOSIS — E1169 Type 2 diabetes mellitus with other specified complication: Secondary | ICD-10-CM

## 2020-12-04 NOTE — Chronic Care Management (AMB) (Signed)
Chronic Care Management Pharmacy  Name: Bethany [name]  MRN: [id number] DOB: [birth year]   Chief Complaint/ HPI  Bethany [name] 59,  75 y.o. , female presents for her Initial CCM visit with the clinical pharmacist via telephone.  PCP : Birdie Sons, MD Patient Care Team: Birdie Sons, MD as PCP - General (Family Medicine) Corey Skains, MD as Consulting Physician (Cardiology) Deboraha Sprang, MD as Consulting Physician (Cardiology) Leandrew Koyanagi, MD as Referring Physician (Ophthalmology) Eulogio Bear, MD as Consulting Physician (Ophthalmology) Gardiner Barefoot, DPM as Consulting Physician (Podiatry) Germaine Pomfret, [facility] (Pharmacist)  Patient's chronic conditions include: Hyperlipidemia, Diabetes, Heart Failure, GERD and Osteoporosis   Office Visits: 09/14/20: Patient presented to Dr. Caryn Section for follow-up. A1c worsened to 7.7%. No medication changes made.   Consult Visit: 09/08/20: Patient presented to Dr. Caryl Comes (Cardiology) for follow-up. BP 120/66. No medication changes made.    Subjective: 09/02/20: Patient presented to Dr. Nehemiah Massed (Cardiology) for follow-up. No medication changes made.   Objective: Allergies  Allergen Reactions  . Other Other (See Comments)    NO BLOOD PRODUCTS- JEHOVAHS WITNESS  . Sulfa Antibiotics Other (See Comments)    Loss of taste    Medications: Outpatient Encounter Medications as of 12/04/2020  Medication Sig  . aspirin EC 81 MG tablet Take 81 mg by mouth daily.  . bisacodyl (DULCOLAX) 5 MG EC tablet Take 5 mg by mouth daily as needed for moderate constipation.  . carvedilol (COREG) 3.125 MG tablet TAKE 1/2 TABLET BY MOUTH TWICE DAILY (DOSE CHANGE)  . dapagliflozin propanediol (FARXIGA) 5 MG TABS tablet Take 1 tablet (5 mg total) by mouth daily before breakfast.  . ferrous sulfate 325 (65 FE) MG tablet Take 325 mg by mouth 2 (two) times daily with a meal.  . furosemide (LASIX) 40 MG tablet Take 0.5-1 tablets (20-40  mg total) by mouth daily.  . Magnesium 400 MG TABS Take 400 mg by mouth daily.  . Multiple Vitamin (MULTIVITAMIN) tablet Take 1 tablet by mouth daily.  . pantoprazole (PROTONIX) 40 MG tablet TAKE 1 TABLET (40 MG TOTAL) BY MOUTH DAILY.  . sacubitril-valsartan (ENTRESTO) 24-26 MG Take 0.5 tablets by mouth 2 (two) times daily.  Marland Kitchen SYNTHROID 50 MCG tablet TAKE 1 TABLET EVERY DAY  . [DISCONTINUED] atorvastatin (LIPITOR) 20 MG tablet TAKE 1 TABLET EVERY DAY  . [DISCONTINUED] JANUMET XR [phone] MG TB24 TAKE 1 TABLET EVERY DAY  . [DISCONTINUED] SYNTHROID 50 MCG tablet TAKE 1 TABLET EVERY DAY  . acetaminophen (TYLENOL) 500 MG tablet Take 1,000 mg by mouth 2 (two) times daily as needed for mild pain or moderate pain. (Patient not taking: Reported on 12/04/2020)  . Alcohol Swabs (B-D SINGLE USE SWABS REGULAR) PADS USE TO CHECK BLOOD SUGAR DAILY  . Blood Glucose Calibration (TRUE METRIX LEVEL 1) Low SOLN Use as directed with glucose meter  . cyanocobalamin 100 MCG tablet Take 100 mcg by mouth daily.  Marland Kitchen glucose blood (TRUE METRIX BLOOD GLUCOSE TEST) test strip TEST BLOOD SUGAR EVERY DAY  . TRUEplus Lancets 33G MISC CHECK BLOOD SUGAR ONE TIME DAILY  . [DISCONTINUED] senna (SENOKOT) 8.6 MG TABS tablet Take 1 tablet by mouth daily as needed for mild constipation. (Patient not taking: Reported on 12/04/2020)  . [DISCONTINUED] VITAMIN D, CHOLECALCIFEROL, PO Take by mouth.   No facility-administered encounter medications on file as of 12/04/2020.    Wt Readings from Last 3 Encounters:  09/14/20 123 lb (55.8 kg)  09/08/20 123 lb (55.8 kg)  07/01/20 129 lb 12.8 oz (58.9 kg)    Lab Results  Component Value Date   CREATININE 0.84 07/01/2020   BUN 7 (L) 07/01/2020   GFR 88.96 02/05/2015   GFRNONAA 69 07/01/2020   GFRAA 79 07/01/2020   NA 139 07/01/2020   K 3.8 07/01/2020   CALCIUM 9.7 07/01/2020   CO2 24 07/01/2020     Current Diagnosis/Assessment:  SDOH Interventions   Flowsheet Row Most Recent  Value  SDOH Interventions   Financial Strain Interventions Intervention Not Indicated  Transportation Interventions Intervention Not Indicated      Goals Addressed            This Visit's Progress   . Chronic Care Management       CARE PLAN ENTRY (see longitudinal plan of care for additional care plan information)  Current Barriers:  . Chronic Disease Management support, education, and care coordination needs related to Hyperlipidemia, Diabetes, Heart Failure, GERD and Osteoporosis   Hypertension BP Readings from Last 3 Encounters:  09/14/20 103/69  09/08/20 120/66  07/01/20 110/62   . Pharmacist Clinical Goal(s): o Over the next 0- days, patient will work with PharmD and providers to maintain BP goal <140/90 . Current regimen:  . Carvedilol 3.125 mg 1/2 tablet twice daily  . Furosemide 40 mg 1/2 tablet daily   . Entresto 24-26 mg 1/2 tablet twice daily  . Interventions: o Discussed low salt diet and exercising as tolerated extensively o Will initiate blood pressure monitoring plan  . Patient self care activities - Over the next 90 days, patient will: o Check blood pressure 2-3 times weekly, document, and provide at future appointments o Ensure daily salt intake < 2300 mg/day  Hyperlipidemia Lab Results  Component Value Date/Time   LDLCALC 34 12/30/2019 03:48 PM   [place] 69 02/21/2014 12:14 AM   . Pharmacist Clinical Goal(s): o Over the next 90 days, patient will work with PharmD and providers to maintain LDL goal < 70 . Current regimen:  o Atorvastatin 20 mg daily  . Interventions: o Discussed low cholesterol diet and exercising as tolerated extensively o Will initiate cholesterol monitoring plan   Diabetes Lab Results  Component Value Date/Time   HGBA1C 7.7 (A) 09/14/2020 11:21 AM   HGBA1C 7.4 (A) 06/02/2020 10:31 AM   HGBA1C 8.2 (H) 04/04/2018 03:02 PM   HGBA1C 6.3 02/20/2014 07:57 PM   . Pharmacist Clinical Goal(s): o Over the next 90 days, patient  will work with PharmD and providers to maintain A1c goal <8% . Current regimen:  . Farxiga 5 mg daily  . Janumet XR [phone] mg daily . Interventions: o Discussed carbohydrate counting and exercising as tolerated extensively o Will initiate blood sugar monitoring plan  . Patient self care activities - Over the next 90 days, patient will: o Check blood sugar once daily, document, and provide at future appointments o Contact provider with any episodes of hypoglycemia  Medication management . Pharmacist Clinical Goal(s): o Over the next 90 days, patient will work with PharmD and providers to maintain optimal medication adherence . Current pharmacy: [facility] . Interventions o Comprehensive medication review performed. o Continue current medication management strategy . Patient self care activities - Over the next 90 days, patient will: o Take medications as prescribed o Report any questions or concerns to PharmD and/or provider(s)      Heart Failure   Manages by Dr. Nehemiah Massed (Heart Failure)  Manages by Dr. Caryl Comes (Pacemaker + defibrillator)   Type: Systolic  S/p CRT D Implant 2016   Last ejection fraction: 35% (2017)  NYHA Class: II (slight limitation of activity) AHA HF Stage: B (Heart disease present - no symptoms present)  Patient has failed these meds in past: NA Patient is currently controlled on the following medications:  . Carvedilol 3.125 mg 1/2 tablet twice daily  . Furosemide 40 mg 0.5-1 tablets daily (1/2 tablet daily)  . Entresto 24-26 mg 1/2 tablet twice daily   We discussed diet and exercise extensively   Entresto affordable.   Plan  Continue current medications  Hyperlipidemia   LDL goal < 70  Last lipids Lab Results  Component Value Date   CHOL 109 12/30/2019   HDL 58 12/30/2019   LDLCALC 34 12/30/2019   TRIG 89 12/30/2019   CHOLHDL 1.9 12/30/2019   Hepatic Function Latest Ref Rng & Units 07/01/2020 06/02/2020 12/04/2018  Total Protein  6.0 - 8.5 g/dL - 6.8 6.9  Albumin 3.7 - 4.7 g/dL 4.1 4.3 4.2  AST 0 - 40 IU/L - 20 25  ALT 0 - 32 IU/L - 12 19  Alk Phosphatase 48 - 121 IU/L - 79 102  Total Bilirubin 0.0 - 1.2 mg/dL - 0.3 0.2     The ASCVD Risk score Mikey Bussing DC Jr., et al., 2013) failed to calculate for the following reasons:   The valid total cholesterol range is 130 to 320 mg/dL   Patient has failed these meds in past: NA Patient is currently controlled on the following medications:  . Aspirin 81 mg daily  . Atorvastatin 20 mg daily   We discussed:  diet and exercise extensively. Denies significant myalgias.   Plan  Continue current medications  Diabetes   A1c goal <8%  Recent Relevant Labs: Lab Results  Component Value Date/Time   HGBA1C 7.7 (A) 09/14/2020 11:21 AM   HGBA1C 7.4 (A) 06/02/2020 10:31 AM   HGBA1C 8.2 (H) 04/04/2018 03:02 PM   HGBA1C 6.3 02/20/2014 07:57 PM   GFR 88.96 02/05/2015 10:34 AM   MICROALBUR negative 06/02/2020 10:36 AM   MICROALBUR negative 12/04/2018 11:50 AM    Last diabetic Eye exam:  Lab Results  Component Value Date/Time   HMDIABEYEEXA No Retinopathy 03/10/2020 12:00 AM    Last diabetic Foot exam: No results found for: HMDIABFOOTEX   Checking BG: Daily  Recent FBG Readings: NA Recent pre-meal BG readings: NA Recent 2hr PP BG readings:  NA Recent HS BG readings: NA  Patient has failed these meds in past: NA Patient is currently controlled on the following medications: .    We discussed: diet and exercise extensively. Controlled on less stringent goal given age, co-morbidities, but A1c is trending up. Patient has not been as strict with her diet over the holidays, but is working to get back on track.   Plan  Continue current medications  Hypothyroidism   Lab Results  Component Value Date/Time   TSH 2.920 06/02/2020 11:05 AM   TSH 1.070 12/30/2019 03:48 PM   FREET4 1.43 03/13/2019 09:27 AM    Patient has failed these meds in past: NA Patient is  currently controlled on the following medications:  . Synthroid 50 mcg daily   We discussed:  Patient separates appropriately.   Plan  Continue current medications  Osteopenia    Last DEXA Scan: 02/05/20   T-Score femoral neck: -1.8  T-Score total hip: -1.3  T-Score lumbar spine: -1.4  T-Score forearm radius: -0.8  10-year probability of major osteoporotic fracture: 8.1%  10-year probability  of hip fracture: 1.6%  Vit D, 25-Hydroxy  Date Value Ref Range Status  12/30/2019 83.4 30.0 - 100.0 ng/mL Final    Comment:    Vitamin D deficiency has been defined by the [place] practice guideline as a level of serum 25-OH vitamin D less than 20 ng/mL (1,2). The Endocrine Society went on to further define vitamin D insufficiency as a level between 21 and 29 ng/mL (2). 1. IOM (Institute of Medicine). 2010. Dietary reference    intakes for calcium and D. [place]: The    [employer]. 2. Holick MF, Binkley [place], Bischoff-Ferrari HA, et al.    Evaluation, treatment, and prevention of vitamin D    deficiency: an Endocrine Society clinical practice    guideline. JCEM. 2011 Jul; 96(7):1911-30.      Patient is not a candidate for pharmacologic treatment  Patient has failed these meds in past: NA Patient is currently controlled on the following medications:  Marland Kitchen Vitamin D 5000 units daily   We discussed:  Recommend [phone] units of vitamin D daily. Recommend 1200 mg of calcium daily from dietary and supplemental sources.  Plan  Continue current medications  GERD   Patient denies dysphagia, heartburn or nausea. Expresses understanding to avoid triggers such as citrus juices, large meals and lying down after eating.  Currently controlled on: . Pantoprazole 40 mg daily  Patient without significant heartburn symptoms in some time. Was amenable to seeing if she can tolerate tapering off PPI therapy.   Plan   Recommend decreasing  pantoprazole to 40 mg every other day Recommend Tums or Pepcid PRN for breakthrough symptoms.   Misc / OTC    . Acetaminophen 500 mg 2 tablets twice daily PRN  . Ferrous sulfate 325 mg twice daily  . Magnesium Oxide 400 mg  . Multivitamin daily (Centrum Silver) . Vitamin B12 1000 mcg daily  . Bisacodyl 5 mg daily   Plan  Continue current medications   Medication Management   Patient's preferred pharmacy is:  [facility], [place] [address] Phone: [phone] Fax: [phone]  [facility] [address] [place] [address] Phone: [phone] Fax: [phone]  [facility] [address] [place] [address] Phone: [phone] Fax: [place], [place] [address] AT [place] [address] Phone: [phone] Fax: [phone]  Uses pill box? Yes Pt endorses 95% compliance  We discussed: Current pharmacy is preferred with insurance plan and patient is satisfied with pharmacy services  Plan  Continue current medication management strategy  Follow up: 6 month phone visit  [place] [phone]

## 2020-12-05 ENCOUNTER — Other Ambulatory Visit: Payer: Self-pay | Admitting: Family Medicine

## 2020-12-05 DIAGNOSIS — E119 Type 2 diabetes mellitus without complications: Secondary | ICD-10-CM

## 2020-12-05 DIAGNOSIS — E039 Hypothyroidism, unspecified: Secondary | ICD-10-CM

## 2020-12-06 NOTE — Telephone Encounter (Signed)
Requested Prescriptions  Pending Prescriptions Disp Refills  . SYNTHROID 50 MCG tablet [Pharmacy Med Name: SYNTHROID 50 MCG Tablet] 90 tablet 0    Sig: TAKE 1 TABLET EVERY DAY     Endocrinology:  Hypothyroid Agents Failed - 12/05/2020  1:19 PM      Failed - TSH needs to be rechecked within 3 months after an abnormal result. Refill until TSH is due.      Passed - TSH in normal range and within 360 days    TSH  Date Value Ref Range Status  06/02/2020 2.920 0.450 - 4.500 uIU/mL Final         Passed - Valid encounter within last 12 months    Recent Outpatient Visits          2 months ago Type 2 diabetes mellitus without complication, without long-term current use of insulin ([place])   [facility] Birdie Sons, MD   5 months ago Type 2 diabetes mellitus with other specified complication, without long-term current use of insulin [facility])   [facility] Birdie Sons, MD   6 months ago Type 2 diabetes mellitus without complication, without long-term current use of insulin [facility])   [facility] Birdie Sons, MD   7 months ago Neck pain on left side   [facility] Birdie Sons, MD   11 months ago Type 2 diabetes mellitus without complication, without long-term current use of insulin [facility])   [facility] Birdie Sons, MD      Future Appointments            In 1 month Fisher, Kirstie Peri, MD [facility]           . atorvastatin (LIPITOR) 20 MG tablet [Pharmacy Med Name: ATORVASTATIN CALCIUM 20 MG Tablet] 90 tablet 0    Sig: TAKE 1 TABLET EVERY DAY     Cardiovascular:  Antilipid - Statins Failed - 12/05/2020  1:19 PM      Failed - LDL in normal range and within 360 days    Ldl Cholesterol, Calc  Date Value Ref Range Status  02/21/2014 69 0 - 100 mg/dL Final   LDL Chol Calc (NIH)  Date Value Ref Range Status  12/30/2019 34 0 - 99 mg/dL Final         Passed - Total Cholesterol in  normal range and within 360 days    Cholesterol, Total  Date Value Ref Range Status  12/30/2019 109 100 - 199 mg/dL Final   Cholesterol  Date Value Ref Range Status  02/21/2014 130 0 - 200 mg/dL Final         Passed - HDL in normal range and within 360 days    HDL Cholesterol  Date Value Ref Range Status  02/21/2014 42 40 - 60 mg/dL Final   HDL  Date Value Ref Range Status  12/30/2019 58 >39 mg/dL Final         Passed - Triglycerides in normal range and within 360 days    Triglycerides  Date Value Ref Range Status  12/30/2019 89 0 - 149 mg/dL Final  02/21/2014 94 0 - 200 mg/dL Final         Passed - Patient is not pregnant      Passed - Valid encounter within last 12 months    Recent Outpatient Visits          2 months ago Type 2 diabetes mellitus without complication, without  long-term current use of insulin [facility])   [facility] Birdie Sons, MD   5 months ago Type 2 diabetes mellitus with other specified complication, without long-term current use of insulin [facility])   [facility] Birdie Sons, MD   6 months ago Type 2 diabetes mellitus without complication, without long-term current use of insulin [facility])   [facility] Birdie Sons, MD   7 months ago Neck pain on left side   [facility] Birdie Sons, MD   11 months ago Type 2 diabetes mellitus without complication, without long-term current use of insulin [facility])   [facility] Birdie Sons, MD      Future Appointments            In 1 month Fisher, Kirstie Peri, MD [facility]           . JANUMET XR [phone] MG TB24 [Pharmacy Med Name: JANUMET XR [phone] MG Tablet Extended Release 24 Hour] 90 tablet 0    Sig: TAKE 1 TABLET EVERY DAY     Endocrinology:  Diabetes - Biguanide + DPP-4 Inhibitor Combos Passed - 12/05/2020  1:19 PM      Passed - HBA1C is between 0 and 7.9 and within 180 days    Hemoglobin A1C   Date Value Ref Range Status  09/14/2020 7.7 (A) 4.0 - 5.6 % Final  02/20/2014 6.3 4.2 - 6.3 % Final    Comment:    The American Diabetes Association recommends that a primary goal of therapy should be <7% and that physicians should reevaluate the treatment regimen in patients with HbA1c values consistently >8%.    Hgb A1c MFr Bld  Date Value Ref Range Status  04/04/2018 8.2 (H) 4.8 - 5.6 % Final    Comment:             Prediabetes: 5.7 - 6.4          Diabetes: >6.4          Glycemic control for adults with diabetes: <7.0          Passed - Cr in normal range and within 360 days    Creatinine  Date Value Ref Range Status  02/04/2015 0.70 mg/dL Final    Comment:    0.44-1.00 NOTE: New Reference Range  01/13/15    Creatinine, Ser  Date Value Ref Range Status  07/01/2020 0.84 0.57 - 1.00 mg/dL Final   Creatinine, POC  Date Value Ref Range Status  12/04/2018 n/a mg/dL Final         Passed - AA eGFR in normal range and within 360 days    EGFR (African American)  Date Value Ref Range Status  02/04/2015 >60  Final   GFR calc Af Amer  Date Value Ref Range Status  07/01/2020 79 >59 mL/min/1.73 Final    Comment:    **[facility] currently reports eGFR in compliance with the current**   recommendations of the [employer]. [facility] will   update reporting as new guidelines are published from the NKF-ASN   Task force.    EGFR (Non-African Amer.)  Date Value Ref Range Status  02/04/2015 >60  Final    Comment:    eGFR values <81m/min/1.73 m2 may be an indication of chronic kidney disease (CKD). Calculated eGFR is useful in patients with stable renal function. The eGFR calculation will not be reliable in acutely ill patients when serum creatinine is changing rapidly. It is  not useful in patients on dialysis. The eGFR calculation may not be applicable to patients at the low and high extremes of body sizes, pregnant women, and vegetarians.    GFR calc non  Af Amer  Date Value Ref Range Status  07/01/2020 69 >59 mL/min/1.73 Final   GFR  Date Value Ref Range Status  02/05/2015 88.96 >60.00 mL/min Final         Passed - Valid encounter within last 6 months    Recent Outpatient Visits          2 months ago Type 2 diabetes mellitus without complication, without long-term current use of insulin ([place])   [facility] Birdie Sons, MD   5 months ago Type 2 diabetes mellitus with other specified complication, without long-term current use of insulin [facility])   [facility] Birdie Sons, MD   6 months ago Type 2 diabetes mellitus without complication, without long-term current use of insulin [facility])   [facility] Birdie Sons, MD   7 months ago Neck pain on left side   [facility] Birdie Sons, MD   11 months ago Type 2 diabetes mellitus without complication, without long-term current use of insulin [facility])   [facility] Birdie Sons, MD      Future Appointments            In 1 month Fisher, Kirstie Peri, MD [facility], [facility]

## 2020-12-07 NOTE — Patient Instructions (Signed)
Visit Information It was great speaking with you today!  Please let me know if you have any questions about our visit.  Goals Addressed            This Visit's Progress   . Chronic Care Management       CARE PLAN ENTRY (see longitudinal plan of care for additional care plan information)  Current Barriers:  . Chronic Disease Management support, education, and care coordination needs related to Hyperlipidemia, Diabetes, Heart Failure, GERD and Osteoporosis   Hypertension BP Readings from Last 3 Encounters:  09/14/20 103/69  09/08/20 120/66  07/01/20 110/62   . Pharmacist Clinical Goal(s): o Over the next 0- days, patient will work with PharmD and providers to maintain BP goal <140/90 . Current regimen:  . Carvedilol 3.125 mg 1/2 tablet twice daily  . Furosemide 40 mg 1/2 tablet daily   . Entresto 24-26 mg 1/2 tablet twice daily  . Interventions: o Discussed low salt diet and exercising as tolerated extensively o Will initiate blood pressure monitoring plan  . Patient self care activities - Over the next 90 days, patient will: o Check blood pressure 2-3 times weekly, document, and provide at future appointments o Ensure daily salt intake < 2300 mg/day  Hyperlipidemia Lab Results  Component Value Date/Time   LDLCALC 34 12/30/2019 03:48 PM   LDLCALC 69 02/21/2014 12:14 AM   . Pharmacist Clinical Goal(s): o Over the next 90 days, patient will work with PharmD and providers to maintain LDL goal < 70 . Current regimen:  o Atorvastatin 20 mg daily  . Interventions: o Discussed low cholesterol diet and exercising as tolerated extensively o Will initiate cholesterol monitoring plan   Diabetes Lab Results  Component Value Date/Time   HGBA1C 7.7 (A) 09/14/2020 11:21 AM   HGBA1C 7.4 (A) 06/02/2020 10:31 AM   HGBA1C 8.2 (H) 04/04/2018 03:02 PM   HGBA1C 6.3 02/20/2014 07:57 PM   . Pharmacist Clinical Goal(s): o Over the next 90 days, patient will work with PharmD and  providers to maintain A1c goal <8% . Current regimen:  . Farxiga 5 mg daily  . Janumet XR [phone] mg daily . Interventions: o Discussed carbohydrate counting and exercising as tolerated extensively o Will initiate blood sugar monitoring plan  . Patient self care activities - Over the next 90 days, patient will: o Check blood sugar once daily, document, and provide at future appointments o Contact provider with any episodes of hypoglycemia  Medication management . Pharmacist Clinical Goal(s): o Over the next 90 days, patient will work with PharmD and providers to maintain optimal medication adherence . Current pharmacy: [facility] . Interventions o Comprehensive medication review performed. o Continue current medication management strategy . Patient self care activities - Over the next 90 days, patient will: o Take medications as prescribed o Report any questions or concerns to PharmD and/or provider(s)       Ms. Heath was given information about Chronic Care Management services today including:  1. CCM service includes personalized support from designated clinical staff supervised by her physician, including individualized plan of care and coordination with other care providers 2. 24/7 contact phone numbers for assistance for urgent and routine care needs. 3. Standard insurance, coinsurance, copays and deductibles apply for chronic care management only during months in which we provide at least 20 minutes of these services. Most insurances cover these services at 100%, however patients may be responsible for any copay, coinsurance and/or deductible if applicable. This service may  help you avoid the need for more expensive face-to-face services. 4. Only one practitioner may furnish and bill the service in a calendar month. 5. The patient may stop CCM services at any time (effective at the end of the month) by phone call to the office staff.  Patient agreed to services and  verbal consent obtained.   The patient verbalized understanding of instructions, educational materials, and care plan provided today and declined offer to receive copy of patient instructions, educational materials, and care plan.  Telephone follow up appointment with pharmacy team member scheduled for: 06/04/21 at 9:00 AM  Garey Ham Clinical Pharmacist [facility] [phone]

## 2020-12-21 ENCOUNTER — Other Ambulatory Visit: Payer: Self-pay | Admitting: Family Medicine

## 2020-12-21 DIAGNOSIS — I5022 Chronic systolic (congestive) heart failure: Secondary | ICD-10-CM

## 2020-12-21 DIAGNOSIS — R5383 Other fatigue: Secondary | ICD-10-CM

## 2020-12-23 ENCOUNTER — Other Ambulatory Visit: Payer: Self-pay | Admitting: Family Medicine

## 2020-12-23 DIAGNOSIS — K219 Gastro-esophageal reflux disease without esophagitis: Secondary | ICD-10-CM

## 2021-01-06 ENCOUNTER — Ambulatory Visit (INDEPENDENT_AMBULATORY_CARE_PROVIDER_SITE_OTHER): Payer: Medicare HMO

## 2021-01-06 DIAGNOSIS — I447 Left bundle-branch block, unspecified: Secondary | ICD-10-CM

## 2021-01-08 LAB — CUP PACEART REMOTE DEVICE CHECK
Battery Remaining Longevity: 12 mo
Battery Voltage: 2.86 V
Brady Statistic AP VP Percent: 0 %
Brady Statistic AP VS Percent: 0 %
Brady Statistic AS VP Percent: 98.73 %
Brady Statistic AS VS Percent: 1.27 %
Brady Statistic RA Percent Paced: 0.01 %
Brady Statistic RV Percent Paced: 2.75 %
Date Time Interrogation Session: 20220302044225
HighPow Impedance: 54 Ohm
Implantable Lead Implant Date: 20160409
Implantable Lead Implant Date: 20160409
Implantable Lead Implant Date: 20160409
Implantable Lead Location: 753858
Implantable Lead Location: 753859
Implantable Lead Location: 753860
Implantable Lead Model: 4598
Implantable Lead Model: 5076
Implantable Pulse Generator Implant Date: 20160409
Lead Channel Impedance Value: 1026 Ohm
Lead Channel Impedance Value: 1140 Ohm
Lead Channel Impedance Value: 1254 Ohm
Lead Channel Impedance Value: 1292 Ohm
Lead Channel Impedance Value: 1368 Ohm
Lead Channel Impedance Value: 1463 Ohm
Lead Channel Impedance Value: 418 Ohm
Lead Channel Impedance Value: 418 Ohm
Lead Channel Impedance Value: 532 Ohm
Lead Channel Impedance Value: 532 Ohm
Lead Channel Impedance Value: 646 Ohm
Lead Channel Impedance Value: 779 Ohm
Lead Channel Impedance Value: 874 Ohm
Lead Channel Pacing Threshold Amplitude: 0.5 V
Lead Channel Pacing Threshold Amplitude: 0.625 V
Lead Channel Pacing Threshold Amplitude: 2.125 V
Lead Channel Pacing Threshold Pulse Width: 0.4 ms
Lead Channel Pacing Threshold Pulse Width: 0.4 ms
Lead Channel Pacing Threshold Pulse Width: 0.8 ms
Lead Channel Sensing Intrinsic Amplitude: 0.25 mV
Lead Channel Sensing Intrinsic Amplitude: 0.25 mV
Lead Channel Sensing Intrinsic Amplitude: 9.625 mV
Lead Channel Sensing Intrinsic Amplitude: 9.625 mV
Lead Channel Setting Pacing Amplitude: 1.5 V
Lead Channel Setting Pacing Amplitude: 2 V
Lead Channel Setting Pacing Amplitude: 2.5 V
Lead Channel Setting Pacing Pulse Width: 0.4 ms
Lead Channel Setting Pacing Pulse Width: 1 ms
Lead Channel Setting Sensing Sensitivity: 0.45 mV

## 2021-01-12 ENCOUNTER — Other Ambulatory Visit: Payer: Self-pay

## 2021-01-12 ENCOUNTER — Ambulatory Visit (INDEPENDENT_AMBULATORY_CARE_PROVIDER_SITE_OTHER): Payer: Medicare HMO | Admitting: Family Medicine

## 2021-01-12 VITALS — BP 109/66 | HR 76 | Temp 97.5°F | Ht 61.0 in | Wt 120.0 lb

## 2021-01-12 DIAGNOSIS — R5383 Other fatigue: Secondary | ICD-10-CM | POA: Diagnosis not present

## 2021-01-12 DIAGNOSIS — E039 Hypothyroidism, unspecified: Secondary | ICD-10-CM

## 2021-01-12 DIAGNOSIS — E559 Vitamin D deficiency, unspecified: Secondary | ICD-10-CM

## 2021-01-12 DIAGNOSIS — E1169 Type 2 diabetes mellitus with other specified complication: Secondary | ICD-10-CM | POA: Diagnosis not present

## 2021-01-12 DIAGNOSIS — D696 Thrombocytopenia, unspecified: Secondary | ICD-10-CM | POA: Diagnosis not present

## 2021-01-12 DIAGNOSIS — G4710 Hypersomnia, unspecified: Secondary | ICD-10-CM | POA: Diagnosis not present

## 2021-01-12 LAB — POCT GLYCOSYLATED HEMOGLOBIN (HGB A1C)
Estimated Average Glucose: 151
Hemoglobin A1C: 6.9 % — AB (ref 4.0–5.6)

## 2021-01-12 NOTE — Progress Notes (Signed)
Established patient visit   Patient: Bethany [name]   DOB: 29-Mar-1946   75 y.o. Female  MRN: [id number] Visit Date: 01/12/2021  Today's healthcare provider: [name], MD   No chief complaint on file.  Subjective    HPI  Diabetes Mellitus Type II, Follow-up  Lab Results  Component Value Date   HGBA1C 7.7 (A) 09/14/2020   HGBA1C 7.4 (A) 06/02/2020   HGBA1C 6.8 (A) 12/30/2019   Wt Readings from Last 3 Encounters:  09/14/20 123 lb (55.8 kg)  09/08/20 123 lb (55.8 kg)  07/01/20 129 lb 12.8 oz (58.9 kg)   Last seen for diabetes 4 months ago.  Management since then includes encouraging to improve diet and increase exercise. Will consider adding additional metformin if A1c continues to rise at follow up in 4 months. She reports excellent compliance with treatment. She is not having side effects.  Symptoms: Yes fatigue Yes foot ulcerations - potentially  No appetite changes - no appetite No nausea  No paresthesia of the feet  Yes polydipsia  Yes polyuria YES visual disturbances - sometimes blurry  No vomiting     Home blood sugar records: 110 fasting on avg  Episodes of hypoglycemia? No    Current insulin regiment: none Most Recent Eye Exam: 03/10/2020 Current exercise: none Current diet habits: in general, an "unhealthy" diet  Pertinent Labs: Lab Results  Component Value Date   CHOL 109 12/30/2019   HDL 58 12/30/2019   LDLCALC 34 12/30/2019   TRIG 89 12/30/2019   CHOLHDL 1.9 12/30/2019   Lab Results  Component Value Date   NA 139 07/01/2020   K 3.8 07/01/2020   CREATININE 0.84 07/01/2020   GFRNONAA 69 07/01/2020   GFRAA 79 07/01/2020   GLUCOSE 168 (H) 07/01/2020     ---------------------------------------------------------------------------------------------------   She reports episodes of sudden onset of sleepiness several times a week. She does feel fatigue most of the time, but sleepiness is rapid unset. She wakes up a few times every night  but is able to get back to sleep. She states she did have a sleep study at the sleep lab a few years ago, although I do not have a record of this.       Medications: Outpatient Medications Prior to Visit  Medication Sig   acetaminophen (TYLENOL) 500 MG tablet Take 1,000 mg by mouth 2 (two) times daily as needed for mild pain or moderate pain. (Patient not taking: Reported on 12/04/2020)   Alcohol Swabs (B-D SINGLE USE SWABS REGULAR) PADS USE TO CHECK BLOOD SUGAR DAILY   aspirin EC 81 MG tablet Take 81 mg by mouth daily.   atorvastatin (LIPITOR) 20 MG tablet TAKE 1 TABLET EVERY DAY   bisacodyl (DULCOLAX) 5 MG EC tablet Take 5 mg by mouth daily as needed for moderate constipation.   Blood Glucose Calibration (TRUE METRIX LEVEL 1) Low SOLN Use as directed with glucose meter   carvedilol (COREG) 3.125 MG tablet TAKE 1/2 TABLET BY MOUTH TWICE DAILY (DOSE CHANGE)   cyanocobalamin 100 MCG tablet Take 100 mcg by mouth daily.   dapagliflozin propanediol (FARXIGA) 5 MG TABS tablet Take 1 tablet (5 mg total) by mouth daily before breakfast.   ferrous sulfate 325 (65 FE) MG tablet Take 325 mg by mouth 2 (two) times daily with a meal.   furosemide (LASIX) 40 MG tablet Take 0.5-1 tablets (20-40 mg total) by mouth daily.   glucose blood (TRUE METRIX BLOOD GLUCOSE TEST) test strip  TEST BLOOD SUGAR EVERY DAY   JANUMET XR [phone] MG TB24 TAKE 1 TABLET EVERY DAY   Magnesium 400 MG TABS Take 400 mg by mouth daily.   Multiple Vitamin (MULTIVITAMIN) tablet Take 1 tablet by mouth daily.   pantoprazole (PROTONIX) 40 MG tablet TAKE 1 TABLET (40 MG TOTAL) BY MOUTH DAILY.   sacubitril-valsartan (ENTRESTO) 24-26 MG Take 0.5 tablets by mouth 2 (two) times daily.   SYNTHROID 50 MCG tablet TAKE 1 TABLET EVERY DAY   TRUEplus Lancets 33G MISC CHECK BLOOD SUGAR ONE TIME DAILY   No facility-administered medications prior to visit.        Objective    BP 109/66 (BP Location: Left Arm, Patient  Position: Sitting, Cuff Size: Normal)    Pulse 76    Temp (!) 97.5 F (36.4 C) (Temporal)    Ht 5\' 1"  (1.549 m)    Wt 120 lb (54.4 kg)    LMP  (LMP Unknown)    SpO2 100%    BMI 22.67 kg/m     Physical Exam    General: Appearance:    Well developed, well nourished female in no acute distress  Eyes:    PERRL, conjunctiva/corneas clear, EOM's intact       Lungs:     Clear to auscultation bilaterally, respirations unlabored  Heart:    Normal heart rate. Normal rhythm.  3/6  MS:   All extremities are intact.   Neurologic:   Awake, alert, oriented x 3. No apparent focal neurological           defect.         Results for orders placed or performed in visit on 01/12/21  POCT HgB A1C  Result Value Ref Range   Hemoglobin A1C 6.9 (A) 4.0 - 5.6 %   Estimated Average Glucose 151     Assessment & Plan     1. Type 2 diabetes mellitus with other specified complication, without long-term current use of insulin (HCC) Well controlled.  Continue current medications.   - Lipid panel  2. Excessive sleepiness She states she has had a sleep study done in the last couple of years, although she doesn't remember who ordered it and I have no record of it. She has been followed by Dr. 03/14/21 in the past. Consider referral back to neuro if labs below are unremarkable.   3. Other fatigue  - Comprehensive metabolic panel - CBC  4. Adult hypothyroidism  - TSH - T4, free  5. Vitamin D deficiency  - VITAMIN D 25 Hydroxy (Vit-D Deficiency, Fractures)    6. Thrombocytopenia/anemia - CBC     The entirety of the information documented in the History of Present Illness, Review of Systems and Physical Exam were personally obtained by me. Portions of this information were initially documented by the CMA and reviewed by me for thoroughness and accuracy.      Sherryll Burger, MD  [facility] [phone] (phone) [phone] (fax)  [facility]

## 2021-01-12 NOTE — Patient Instructions (Addendum)
Please review the attached list of medications and notify my office if there are any errors.   Please go to the lab draw station in [address] on the second floor of [facility]  when you are fasting for 8 hours. Normal hours are 8:00am to 11:30am and 1:00pm to 4:00pm Monday through Friday     

## 2021-01-13 DIAGNOSIS — E039 Hypothyroidism, unspecified: Secondary | ICD-10-CM | POA: Diagnosis not present

## 2021-01-13 DIAGNOSIS — E1169 Type 2 diabetes mellitus with other specified complication: Secondary | ICD-10-CM | POA: Diagnosis not present

## 2021-01-13 DIAGNOSIS — E559 Vitamin D deficiency, unspecified: Secondary | ICD-10-CM | POA: Diagnosis not present

## 2021-01-13 DIAGNOSIS — R5383 Other fatigue: Secondary | ICD-10-CM | POA: Diagnosis not present

## 2021-01-14 LAB — LIPID PANEL
Chol/HDL Ratio: 2.2 ratio (ref 0.0–4.4)
Cholesterol, Total: 120 mg/dL (ref 100–199)
HDL: 55 mg/dL (ref >39)
LDL Chol Calc (NIH): 49 mg/dL (ref 0–99)
Triglycerides: 79 mg/dL (ref 0–149)
VLDL Cholesterol Cal: 16 mg/dL (ref 5–40)

## 2021-01-14 LAB — COMPREHENSIVE METABOLIC PANEL
ALT: 15 IU/L (ref 0–32)
AST: 26 IU/L (ref 0–40)
Albumin/Globulin Ratio: 1.6 (ref 1.2–2.2)
Albumin: 4.3 g/dL (ref 3.7–4.7)
Alkaline Phosphatase: 84 IU/L (ref 44–121)
BUN/Creatinine Ratio: 9 — ABNORMAL LOW (ref 12–28)
BUN: 8 mg/dL (ref 8–27)
Bilirubin Total: 0.3 mg/dL (ref 0.0–1.2)
CO2: 25 mmol/L (ref 20–29)
Calcium: 9.9 mg/dL (ref 8.7–10.3)
Chloride: 103 mmol/L (ref 96–106)
Creatinine, Ser: 0.85 mg/dL (ref 0.57–1.00)
Globulin, Total: 2.7 g/dL (ref 1.5–4.5)
Glucose: 113 mg/dL — ABNORMAL HIGH (ref 65–99)
Potassium: 4.2 mmol/L (ref 3.5–5.2)
Sodium: 142 mmol/L (ref 134–144)
Total Protein: 7 g/dL (ref 6.0–8.5)
eGFR: 72 mL/min/{1.73_m2} (ref >59)

## 2021-01-14 LAB — CBC
Hematocrit: 37.5 % (ref 34.0–46.6)
Hemoglobin: 12.2 g/dL (ref 11.1–15.9)
MCH: 29.2 pg (ref 26.6–33.0)
MCHC: 32.5 g/dL (ref 31.5–35.7)
MCV: 90 fL (ref 79–97)
Platelets: 115 10*3/uL — ABNORMAL LOW (ref 150–450)
RBC: 4.18 x10E6/uL (ref 3.77–5.28)
RDW: 13 % (ref 11.7–15.4)
WBC: 4.3 10*3/uL (ref 3.4–10.8)

## 2021-01-14 LAB — VITAMIN D 25 HYDROXY (VIT D DEFICIENCY, FRACTURES): Vit D, 25-Hydroxy: 99 ng/mL (ref 30.0–100.0)

## 2021-01-14 LAB — T4, FREE: Free T4: 1.28 ng/dL (ref 0.82–1.77)

## 2021-01-14 LAB — TSH: TSH: 2.85 u[IU]/mL (ref 0.450–4.500)

## 2021-01-14 NOTE — Progress Notes (Signed)
Remote ICD transmission.   

## 2021-01-18 ENCOUNTER — Ambulatory Visit: Payer: Self-pay | Admitting: *Deleted

## 2021-01-18 ENCOUNTER — Telehealth: Payer: Self-pay

## 2021-01-18 DIAGNOSIS — G4710 Hypersomnia, unspecified: Secondary | ICD-10-CM

## 2021-01-18 NOTE — Telephone Encounter (Signed)
Please route this result note to [facility] Nurse Triage.  Also [facility] Nurses do not place referrals.

## 2021-01-18 NOTE — Telephone Encounter (Signed)
-----   Message from Malva Limes, MD sent at 01/15/2021  7:48 AM EST ----- Her vitamin d level is getting a little too high. Is she taking a vitamin d supplement?  Rest of labs are good. Continue all other medications.  Please schedule follow up diabetes in 4 months.  She needs referral to neurology for hypersomnia, rule out narcolepsy.

## 2021-01-18 NOTE — Telephone Encounter (Signed)
Referral order placed. Please schedule.  

## 2021-01-18 NOTE — Addendum Note (Signed)
Addended by: Benjiman Core on: 01/18/2021 02:35 PM   Modules accepted: Orders

## 2021-01-18 NOTE — Telephone Encounter (Signed)
Pt called in and was given the lab result message from Dr. Sherrie Mustache dated 01/15/2021 at 7:48 AM.  She is fine with a neurology referral for the hypersomnia.   Whoever Dr. Sherrie Mustache recommends.  She verbalized understanding to stop taking the vitamin D supplement she is taking.  I scheduled her for a 4 mo. F/u for 05/17/2201 at 8:20.   Covid questionnaire completed.

## 2021-02-10 DIAGNOSIS — E782 Mixed hyperlipidemia: Secondary | ICD-10-CM | POA: Diagnosis not present

## 2021-02-10 DIAGNOSIS — I10 Essential (primary) hypertension: Secondary | ICD-10-CM | POA: Diagnosis not present

## 2021-02-10 DIAGNOSIS — I5022 Chronic systolic (congestive) heart failure: Secondary | ICD-10-CM | POA: Diagnosis not present

## 2021-02-10 DIAGNOSIS — Z95810 Presence of automatic (implantable) cardiac defibrillator: Secondary | ICD-10-CM | POA: Diagnosis not present

## 2021-02-15 ENCOUNTER — Other Ambulatory Visit: Payer: Self-pay | Admitting: Family Medicine

## 2021-02-15 DIAGNOSIS — E119 Type 2 diabetes mellitus without complications: Secondary | ICD-10-CM

## 2021-02-15 DIAGNOSIS — E039 Hypothyroidism, unspecified: Secondary | ICD-10-CM

## 2021-02-18 ENCOUNTER — Ambulatory Visit: Payer: Medicare HMO | Admitting: Neurology

## 2021-02-18 ENCOUNTER — Encounter: Payer: Self-pay | Admitting: Neurology

## 2021-02-18 VITALS — BP 108/69 | HR 78 | Ht 61.0 in | Wt 118.0 lb

## 2021-02-18 DIAGNOSIS — Z95810 Presence of automatic (implantable) cardiac defibrillator: Secondary | ICD-10-CM | POA: Diagnosis not present

## 2021-02-18 DIAGNOSIS — I5022 Chronic systolic (congestive) heart failure: Secondary | ICD-10-CM | POA: Diagnosis not present

## 2021-02-18 DIAGNOSIS — F5101 Primary insomnia: Secondary | ICD-10-CM | POA: Diagnosis not present

## 2021-02-18 DIAGNOSIS — G4719 Other hypersomnia: Secondary | ICD-10-CM | POA: Diagnosis not present

## 2021-02-18 DIAGNOSIS — E1169 Type 2 diabetes mellitus with other specified complication: Secondary | ICD-10-CM | POA: Diagnosis not present

## 2021-02-18 NOTE — Patient Instructions (Signed)

## 2021-02-18 NOTE — Progress Notes (Signed)
SLEEP MEDICINE CLINIC    Provider:  Larey Seat, MD  Primary Care Physician:  Bethany Sons, MD [address] [place] [address]     Referring Provider: Birdie [name], [place],  [place] [address]          Chief Complaint according to patient   Patient presents with:    . New Patient (Initial Visit)     Presents today to address if sleep apnea concerns are present. She states that she can fall asleep without difficulty but unable to stay asleep during the night. She avg 4 hrs of sleep (broken) states that she doesn't snore. She describes feeling exhausted throughout the day and having to take naps. Last SS > 3-5 yrs ago and was negative for apnea.      HISTORY OF PRESENT ILLNESS:  Bethany [name] is a 75 y.o. African American female patient  was seen ipon a referral  from Dr Bethany [name], on 02/18/2021 , for a sleep consultation.   Chief concern according to patient :   Mrs. Bethany [name] is seen here today in a consultation on 18 February 2021 with a complaint of increasing fatigue and daytime sleepiness.  Several times a week she feels that she has the overwhelming urge to sleep often when she is driving.  A sleep study have been obtained about 7-10 years ago, in that timeframe, and was at the time negative for sleep apnea.  The patient has a significant cardiac history, she has a cardiomyopathy  ( diagnosed in 2016) which has led to congestive heart failure and irregular heartbeats and she needed a implanted defibrillator-pacemaker.  She also reports that she does not get a whole lot of sleep she may only get 4 or 5 hours of sleep often fragmented.  It is not trouble to fall asleep her trouble is to maintain and sustain sleep.she will go to the bathroom, most nights going right back to sleep.      Bethany [name] has a past medical history of AICD (automatic cardioverter/defibrillator) present, Anemia, Anxiety, CHF (congestive heart  failure) ([place]), Complication of anesthesia, Depression, Diabetes ([place]), Dilated cardiomyopathy ([place]), Diverticulosis, Dyspnea, Dysrhythmia, Edema, GERD (gastroesophageal reflux disease), Hyperlipidemia, Hypothyroidism, LV dysfunction, Orthopnea, PONV (postoperative nausea and vomiting), Reflux, Thrombocytopenia ([place]), Thyroid disease, and VHD (valvular heart disease).     Family medical /sleep history: one sister with OSA,  Unaware of any family member with  Insomnia. One Sister has CHF. She grew up one of 21 siblings, 89 of her siblings are still living, 7 sisters and one brother are left, all have DM.    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 She lives in a household with her sister - is divorced. siter is widowed. 4 adult children.  The patient used to work in shifts( late PM/not rotating,) Tobacco use almost 50 years ago,never smoked. ETOH use; none, Caffeine intake in form of Coffee( 1 cup a week, ususally decaffeine) , Soda( coke- 1 -2 a week) , Tea ( when eating out,  ) no energy drinks. Regular exercise in form of walking.    Hobbies : reading, bible class    Sleep habits are as follows: The patient's dinner time is between 7-7.30 PM.  The patient goes to bed at 11-12 PM and continues to sleep for 3-4 hours, wakes for one bathroom breaks, the first time at 3 AM.  The preferred sleep position is on her sides, with the support of 3 pillows.  Dreams are reportedly infrequent.  6.30 AM is the usual rise time. The patient wakes up spontaneously at 6 AM .   She reports not feeling refreshed or restored in AM, with symptoms such as dry mouth, sometimes morning headaches and residual fatigue.  Naps are taken almost daily - frequently, lasting from 1 Pm to 3 Pm  and are more refreshing than nocturnal sleep. sometimes she sleeps all afternoon-    Review of Systems: Out of a complete 14 system review, the patient complains of only the following  symptoms, and all other reviewed systems are negative.:  Fatigue, sleepiness ,  not sure if there is any snoring, fragmented sleep,  Insomnia- this is more of a daytime shifting to naps.    [name] likely are you to doze in the following situations: 0 = not likely, 1 = slight chance, 2 = moderate chance, 3 = high chance   Sitting and Reading? Watching Television? Sitting inactive in a public place (theater or meeting)? As a passenger in a car for an hour without a break? Lying down in the afternoon when circumstances permit? Sitting and talking to someone? Sitting quietly after lunch without alcohol? In a car, while stopped for a few minutes in traffic?   Total = 16/ 24 points   FSS endorsed at 48/ 63 points.   Social History   Socioeconomic History  . Marital status: Divorced    Spouse name: Not on file  . Number of children: 4  . Years of education: Not on file  . Highest education level: GED or equivalent  Occupational History  . Occupation: retired  Tobacco Use  . Smoking status: Never Smoker  . Smokeless tobacco: Former Systems developer    Types: Snuff  Vaping Use  . Vaping Use: Never used  Substance and Sexual Activity  . Alcohol use: No    Alcohol/week: 0.0 standard drinks  . Drug use: No  . Sexual activity: Not on file  Other Topics Concern  . Not on file  Social History Narrative  . Not on file   Social Determinants of Health   Financial Resource Strain: Low Risk   . Difficulty of Paying Living Expenses: Not hard at all  Food Insecurity: No Food Insecurity  . Worried About Charity fundraiser in the Last Year: Never true  . Ran Out of Food in the Last Year: Never true  Transportation Needs: No Transportation Needs  . Lack of Transportation (Medical): No  . Lack of Transportation (Non-Medical): No  Physical Activity: Insufficiently Active  . Days of Exercise per Week: 2 days  . Minutes of Exercise per Session: 10 min  Stress: No Stress Concern Present  . Feeling of  Stress : Not at all  Social Connections: Moderately Integrated  . Frequency of Communication with Friends and Family: More than three times a week  . Frequency of Social Gatherings with Friends and Family: More than three times a week  . Attends Religious Services: More than 4 times per year  . Active Member of Clubs or Organizations: Yes  . Attends Archivist Meetings: More than 4 times per year  . Marital Status: Divorced    Family History  Problem Relation Age of Onset  . Heart disease Mother   . Heart attack Mother   . Prostate cancer Father   . Ulcers Father   . Stroke Sister   .  Heart attack Sister   . Diabetes Sister   . Diabetes Sister   . Heart attack Brother   . Stroke Brother   . Breast cancer Neg Hx     Past Medical History:  Diagnosis Date  . AICD (automatic cardioverter/defibrillator) present   . Anemia   . Anxiety   . CHF (congestive heart failure) ([place])   . Complication of anesthesia   . Depression   . Diabetes ([place])   . Dilated cardiomyopathy ([place])   . Diverticulosis   . Dyspnea   . Dysrhythmia   . Edema    FEET/ANKLES OCCAS  . GERD (gastroesophageal reflux disease)   . Hyperlipidemia   . Hypothyroidism   . LV dysfunction   . Orthopnea    USES 3 PILLOWS  . PONV (postoperative nausea and vomiting)   . Reflux   . Thrombocytopenia ([place])   . Thyroid disease   . VHD (valvular heart disease)     Past Surgical History:  Procedure Laterality Date  . ABDOMINAL HYSTERECTOMY    . BI-VENTRICULAR IMPLANTABLE CARDIOVERTER DEFIBRILLATOR [name] 02/11/2015   Procedure: BI-VENTRICULAR IMPLANTABLE CARDIOVERTER DEFIBRILLATOR  (CRT-D);  Surgeon: Deboraha Sprang, MD;  Location: [facility] CATH LAB;  Service: Cardiovascular;  Laterality: [name];  . BONE MARROW BIOPSY  2007   spine  . BREAST BIOPSY Right    neg  . CARDIAC CATHETERIZATION    . CATARACT EXTRACTION W/PHACO Right 01/03/2017   Procedure: CATARACT EXTRACTION PHACO AND INTRAOCULAR LENS PLACEMENT (IOC);   Surgeon: Leandrew Koyanagi, MD;  Location: [facility] ORS;  Service: Ophthalmology;  Laterality: Right;  [place] 00:50 AP% 19.5 CDE 9.94 fluid pack lot # [id number]  . CATARACT EXTRACTION W/PHACO Left 10/27/2016   Procedure: CATARACT EXTRACTION PHACO AND INTRAOCULAR LENS PLACEMENT (IOC);  Surgeon: Leandrew Koyanagi, MD;  Location: [facility] ORS;  Service: Ophthalmology;  Laterality: Left;  [place]  01:12 AP%  21.0 CDE  11.59 Fluid pack lot # [id number]  . CHOLECYSTECTOMY    . INSERT / REPLACE / REMOVE PACEMAKER     AICD  . LEAD REVISION [name] 02/11/2015   Procedure: LEAD REVISION;  Surgeon: Deboraha Sprang, MD;  [facility] C[facility] LAB;  Service: Cardiovascular;  Laterality: [name];  . PARTIAL HYSTERECTOMY  1977  . TOOTH EXTRACTION  09/23/2015     Current Outpatient Medications on File Prior to Visit  Medication Sig Dispense Refill  . acetaminophen (TYLENOL) 500 MG tablet Take 1,000 mg by mouth 2 (two) times daily as needed for mild pain or moderate pain.    . Alcohol Swabs (B-D SINGLE USE SWABS REGULAR) PADS USE TO CHECK BLOOD SUGAR DAILY 100 each 4  . aspirin EC 81 MG tablet Take 81 mg by mouth daily.    Marland Kitchen atorvastatin (LIPITOR) 20 MG tablet TAKE 1 TABLET EVERY DAY 90 tablet 3  . bisacodyl (DULCOLAX) 5 MG EC tablet Take 5 mg by mouth daily as needed for moderate constipation.    . Blood Glucose Calibration (TRUE METRIX LEVEL 1) Low SOLN Use as directed with glucose meter 1 each 3  . carvedilol (COREG) 3.125 MG tablet TAKE 1/2 TABLET BY MOUTH TWICE DAILY (DOSE CHANGE) 90 tablet 0  . cyanocobalamin 100 MCG tablet Take 100 mcg by mouth daily.    . dapagliflozin propanediol (FARXIGA) 5 MG TABS tablet Take 1 tablet (5 mg total) by mouth daily before breakfast. 90 tablet 3  . ferrous sulfate 325 (65 FE) MG tablet Take 325 mg by mouth 2 (two) times daily with a meal.    .  furosemide (LASIX) 40 MG tablet Take 0.5-1 tablets (20-40 mg total) by mouth daily.    Marland Kitchen glucose blood (TRUE METRIX BLOOD GLUCOSE TEST) test strip  TEST BLOOD SUGAR EVERY DAY 100 strip 3  . JANUMET XR [phone] MG TB24 TAKE 1 TABLET EVERY DAY 90 tablet 1  . Magnesium 400 MG TABS Take 400 mg by mouth daily.    . Multiple Vitamin (MULTIVITAMIN) tablet Take 1 tablet by mouth daily.    . sacubitril-valsartan (ENTRESTO) 24-26 MG Take 0.5 tablets by mouth 2 (two) times daily.    Marland Kitchen SYNTHROID 50 MCG tablet TAKE 1 TABLET EVERY DAY 90 tablet 3  . TRUEplus Lancets 33G MISC CHECK BLOOD SUGAR ONE TIME DAILY 100 each 4   No current facility-administered medications on file prior to visit.    Allergies  Allergen Reactions  . Other Other (See Comments)    NO BLOOD PRODUCTS- JEHOVAHS WITNESS  . Sulfa Antibiotics Other (See Comments)    Loss of taste    Physical exam:  Today's Vitals   02/18/21 1110  BP: 108/69  Pulse: 78  Weight: 118 lb (53.5 kg)  Height: '5\' 1"'  (1.549 m)   Body mass index is 22.3 kg/m.   Wt Readings from Last 3 Encounters:  02/18/21 118 lb (53.5 kg)  01/12/21 120 lb (54.4 kg)  09/14/20 123 lb (55.8 kg)     Ht Readings from Last 3 Encounters:  02/18/21 '5\' 1"'  (1.549 m)  01/12/21 '5\' 1"'  (1.549 m)  09/08/20 '5\' 1"'  (1.549 m)      General: The patient is awake, alert and appears not in acute distress. The patient is well groomed. Head: Normocephalic, atraumatic. Neck is supple.  Mallampati  1-2 neck circumference:13 inches . Nasal airflow patent.   Retrognathia is not seen.  Dental status: dentures.  Cardiovascular:  Regular rate and cardiac rhythm by pulse,  without distended neck veins. Respiratory: Lungs are clear to auscultation.  Skin:  Without evidence of ankle edema, or rash. Trunk: The patient's posture is erect.   Neurologic exam : The patient is awake and alert, oriented to place and time.   Memory subjective described as intact.  Attention span & concentration ability appears normal.  Speech is fluent,  with dysphonia .  Mood and affect are appropriate.   Cranial nerves: no loss of smell or taste  reported  Pupils are equally small an round,  and  reactive to light. Status post cataract surgery.  She reports impaired color vision. Funduscopic exam deferred.   Extraocular movements in vertical and horizontal planes were intact and without nystagmus. No Diplopia. Visual fields by finger perimetry are intact. Hearing was intact to soft voice and finger rubbing. She reports sometimes a roaring sound when in the shower only on the right ear. Denies any vertigo,   Facial sensation intact to fine touch.  Facial motor strength is symmetric and tongue and uvula move midline. dark pink gingiva.  Neck ROM : rotation, tilt and flexion extension were normal for age and shoulder shrug was symmetrical.    Motor exam:  Symmetric bulk, tone and ROM.   Normal tone without cog wheeling, symmetric grip strength .   Sensory:  Fine touch and vibration were lost at the ankle of the left foot, but felt on the right.  Proprioception tested in the upper extremities was normal Coordination: Rapid alternating movements in the fingers/hands were of normal speed.  The Finger-to-nose maneuver was intact without evidence of ataxia, dysmetria or tremor. Gait  and station: Patient could rise unassisted from a seated position, walked without assistive device.  Stance is of normal width/ base a.  Toe and heel walk were deferred.  Deep tendon reflexes: in the upper and lower extremities are symmetric and intact.  Babinski response was deferred.      After spending a total time of 45 minutes face to face and additional time for physical and neurologic examination, review of laboratory studies,  personal review of imaging studies, reports and results of other testing and review of referral information / records as far as provided in visit, I have established the following assessments:  The patients previous sleep study seems to have been preceeding her history of cardiomyopathy, CHF and defibrillator implant.  She is not  aware of being a snorer, and her sister certainly has never told her that she has.  She has rarely woken up gasping for air and she does not feel that she is short of breath while at rest. She used to have RLS, she reports that she used to have creepy crawly sensations ascending her legs, she has these rarely now.  0) excessive daytime sleepiness. In the realm of poorly controlled diabetes, and cardiomyopathy, CHF, arrhythmia.  1)  Recheck for sleep apnea, this time concentrating on her risk factors for CENTRAL type sleep apnea.  2) attended sleep study to correlate heart rate and rhythm with any apnea events.  3) sleep hygiene - discussed reduction in nap time, set an alarm  to keep a nap under 40 minutes.    My Plan is to proceed with:  1) attended sleep study at [facility].  I encouraged melatonin at bedtime.    I would like to thank  Bethany Sons, Md [address] [place],  [place] [address] for allowing me to meet with and to take care of this pleasant patient.     I plan to follow up either personally or through our NP within 2-4 month.   CC: I will share my notes with PCP>.  Electronically signed by: Bethany Seat, MD 02/18/2021 11:32 AM  [facility] Board certified by The [employer] of Sleep Medicine and Diplomate of the [employer] of Sleep Medicine. Board certified In Neurology through the [place], Fellow of the [employer] of Neurology. Medical Director of [facility].

## 2021-03-01 ENCOUNTER — Ambulatory Visit: Payer: Medicare HMO | Admitting: Podiatry

## 2021-03-01 ENCOUNTER — Other Ambulatory Visit: Payer: Self-pay

## 2021-03-01 ENCOUNTER — Encounter: Payer: Self-pay | Admitting: Podiatry

## 2021-03-01 DIAGNOSIS — B351 Tinea unguium: Secondary | ICD-10-CM

## 2021-03-01 DIAGNOSIS — M79674 Pain in right toe(s): Secondary | ICD-10-CM

## 2021-03-01 DIAGNOSIS — E119 Type 2 diabetes mellitus without complications: Secondary | ICD-10-CM

## 2021-03-01 NOTE — Progress Notes (Signed)
This patient returns to my office for at risk foot care.  This patient requires this care by a professional since this patient will be at risk due to having type 2 diabetes and thrombocytopenia..  This patient is unable to cut nails herself since the patient cannot reach her nails.These nails are painful walking and wearing shoes.  This patient presents for at risk foot care today.  General Appearance  Alert, conversant and in no acute stress.  Vascular  Dorsalis pedis  are palpable  Bilaterally. Posterior tibial pulses are absent  B/L. Capillary return is within normal limits  bilaterally. Cold feet  Bilaterally.  Absent digital hair.  Neurologic  Senn-Weinstein monofilament wire test within normal limits  bilaterally. Muscle power within normal limits bilaterally.  Nails Thick disfigured discolored nails with subungual debris with incurvated nails both borders right hallux.. No evidence of bacterial infection or drainage bilaterally.  Orthopedic  No limitations of motion  feet .  No crepitus or effusions noted.  No bony pathology or digital deformities noted.  Skin  normotropic skin with no porokeratosis noted bilaterally.  No signs of infections or ulcers noted.     Onychomycosis  Pain in right toes  Pain in left toes  Consent was obtained for treatment procedures.   Mechanical debridement of nail right hallux. performed with a nail nipper.  Filed with dremel without incident.    Return office visit   3 months                   Told patient to return for periodic foot care and evaluation due to potential at risk complications.   Helane Gunther DPM

## 2021-03-24 ENCOUNTER — Other Ambulatory Visit: Payer: Self-pay | Admitting: Family Medicine

## 2021-03-24 DIAGNOSIS — R5383 Other fatigue: Secondary | ICD-10-CM

## 2021-03-24 DIAGNOSIS — I5022 Chronic systolic (congestive) heart failure: Secondary | ICD-10-CM

## 2021-04-06 ENCOUNTER — Ambulatory Visit: Payer: Self-pay | Admitting: *Deleted

## 2021-04-06 NOTE — Telephone Encounter (Signed)
FYI...   Pt has an appointment for 04/26/2021

## 2021-04-06 NOTE — Telephone Encounter (Signed)
  Patient has concerns related to recent weight loss   Patient's lost roughly 7 pounds since December of 2021 and gone from 118 to 111 lbs   Patient shares that they're recently seen a decline in their appetite as well over the last 2-3 months   Patient's seen their blood pressure and blood sugar lower within the last 2-3 months as well   This past week patient's blood pressure has read as follows on the following days   5/31 94/71  5/30 94/69  5/29 92/62  5/28 95/69  5/27 98/69  5/26 92/66   Called patient to review weight loss concerns. Patient reports she has noticed weight loss since Dec. 2021 from 118 lbs- now 111 lbs. Patient has noticed her B/P and blood glucose has decreased since her weight loss. See above B/P. Some dizziness at times but patient denies issue at this time and no weakness. C/o feeling tired and no energy. Blood glucose in am in the 90's. During the day 150's. Drinking plenty of fluids. Denies pain . Can walk and exercise without difficulty. appt scheduled 04/26/21 with PCP to review weight loss and medications. Patient also has appt 05/17/21. Patient requesting to review all information at 04/26/21 appt if able and/or  cancel 05/17/21 appt or if PCP ok with her waiting until 05/17/21 appt to review weight loss concerns and cancel 04/26/21 appt. Please advise. Care advise given. Patient verbalized understanding of care advise and to call back or go to [facility] or ED if symptoms worsen.  Reason for Disposition . Significant weight loss (i.e., > 10 pounds or 5 kg)  Answer Assessment - Initial Assessment Questions 1. CONCERN: "What happened that made you call today?"     Having increasing weight loss , tired  And no energy  2. ONSET: "When did the weight loss begin?" (e.g., minutes, hours or days ago)     Dec. 2021 3. UNDERWEIGHT: "Are you losing weight?" "Is the weight loss intentional?"     Yes not intentional  4. OVERWEIGHT: "Do you feel you are gaining too much weight?"      Na  5. FOOD INTAKE: "Do you worry about what you eat? How much you eat?"     Trying to make herself eat even when she is not hungry  6. EXERCISE:  "Do you exercise?" If Yes, ask: "What type of exercise do you do?"     Some but not strenuous  7. CURRENT WEIGHT: "How much do you weigh?"       111 lbs  8. CURRENT HEIGHT: "How tall are you?"     5'1 9. BMI: "What is your BMI (body mass index)?" Note: The triager can use the BMI calculator on the CDC website at [employer].    - Underweight: Below 18.5   - Normal, healthy weight: 18.5 - 24.9   - Overweight: 25.0 - 29.9   - Obese: 30 or above     Normal  10. MEDICINES: "Do you routinely use medicines (laxatives, diuretics, enemas) to help control your weight?"       No  11. OTHER SYMPTOMS: "What symptoms are you currently experiencing?" (e.g., none; abnormal heart rate, blackout spells, shakiness, suicidal thoughts, vomiting)       Some dizziness but not a problem , tired and no energy  12. PREGNANCY: "Is there any chance you are pregnant?" "When was your last menstrual period?"       na  Protocols used: EATING DISORDERS SYMPTOMS AND QUESTIONS-A-AH

## 2021-04-06 NOTE — Telephone Encounter (Signed)
LMTCB 04/06/2021.  PEC please advise pt as directed below.   Thanks,   -Vernona Rieger

## 2021-04-06 NOTE — Telephone Encounter (Signed)
Janumet can cause weight lot. She can stop taking the Janumet for now and we'll see how her weight is at appointment later this month.

## 2021-04-07 ENCOUNTER — Ambulatory Visit (INDEPENDENT_AMBULATORY_CARE_PROVIDER_SITE_OTHER): Payer: Medicare HMO

## 2021-04-07 DIAGNOSIS — I428 Other cardiomyopathies: Secondary | ICD-10-CM

## 2021-04-07 NOTE — Telephone Encounter (Signed)
Patient advised and verbalized understanding 

## 2021-04-08 LAB — CUP PACEART REMOTE DEVICE CHECK
Battery Remaining Longevity: 9 mo
Battery Voltage: 2.84 V
Brady Statistic AP VP Percent: 0 %
Brady Statistic AP VS Percent: 0 %
Brady Statistic AS VP Percent: 98.75 %
Brady Statistic AS VS Percent: 1.24 %
Brady Statistic RA Percent Paced: 0.01 %
Brady Statistic RV Percent Paced: 4.6 %
Date Time Interrogation Session: 20220601012503
HighPow Impedance: 52 Ohm
Implantable Lead Implant Date: 20160409
Implantable Lead Implant Date: 20160409
Implantable Lead Implant Date: 20160409
Implantable Lead Location: 753858
Implantable Lead Location: 753859
Implantable Lead Location: 753860
Implantable Lead Model: 4598
Implantable Lead Model: 5076
Implantable Pulse Generator Implant Date: 20160409
Lead Channel Impedance Value: 1007 Ohm
Lead Channel Impedance Value: 1083 Ohm
Lead Channel Impedance Value: 1178 Ohm
Lead Channel Impedance Value: 1292 Ohm
Lead Channel Impedance Value: 1368 Ohm
Lead Channel Impedance Value: 1463 Ohm
Lead Channel Impedance Value: 342 Ohm
Lead Channel Impedance Value: 399 Ohm
Lead Channel Impedance Value: 475 Ohm
Lead Channel Impedance Value: 513 Ohm
Lead Channel Impedance Value: 608 Ohm
Lead Channel Impedance Value: 779 Ohm
Lead Channel Impedance Value: 874 Ohm
Lead Channel Pacing Threshold Amplitude: 0.5 V
Lead Channel Pacing Threshold Amplitude: 0.625 V
Lead Channel Pacing Threshold Amplitude: 2.125 V
Lead Channel Pacing Threshold Pulse Width: 0.4 ms
Lead Channel Pacing Threshold Pulse Width: 0.4 ms
Lead Channel Pacing Threshold Pulse Width: 0.8 ms
Lead Channel Sensing Intrinsic Amplitude: 0.375 mV
Lead Channel Sensing Intrinsic Amplitude: 0.375 mV
Lead Channel Sensing Intrinsic Amplitude: 9.375 mV
Lead Channel Sensing Intrinsic Amplitude: 9.375 mV
Lead Channel Setting Pacing Amplitude: 1.5 V
Lead Channel Setting Pacing Amplitude: 2 V
Lead Channel Setting Pacing Amplitude: 2.5 V
Lead Channel Setting Pacing Pulse Width: 0.4 ms
Lead Channel Setting Pacing Pulse Width: 1 ms
Lead Channel Setting Sensing Sensitivity: 0.45 mV

## 2021-04-11 ENCOUNTER — Encounter: Payer: Self-pay | Admitting: Emergency Medicine

## 2021-04-11 ENCOUNTER — Other Ambulatory Visit: Payer: Self-pay

## 2021-04-11 ENCOUNTER — Ambulatory Visit
Admission: EM | Admit: 2021-04-11 | Discharge: 2021-04-11 | Disposition: A | Discharge status: Home / Self Care | Payer: Medicare HMO | Attending: Family Medicine | Admitting: Family Medicine

## 2021-04-11 DIAGNOSIS — R1013 Epigastric pain: Secondary | ICD-10-CM

## 2021-04-11 MED ORDER — PANTOPRAZOLE SODIUM 40 MG PO TBEC: 40.0000 mg | DELAYED_RELEASE_TABLET | Freq: Every day | ORAL | 0 refills | Status: DC

## 2021-04-11 MED ORDER — ONDANSETRON 4 MG PO TBDP: 4.0000 mg | ORAL_TABLET | Freq: Three times a day (TID) | ORAL | 0 refills | Status: DC | PRN

## 2021-04-11 NOTE — Discharge Instructions (Signed)
Medication as prescribed.  If you worsen, please go to the ER.   I have reached out to Dr. Sherrie Mustache. I recommend you see him this week.  Take care  Dr. Adriana Simas

## 2021-04-11 NOTE — ED Triage Notes (Signed)
Patient c/o headache and epigastric pain that started yesterday.  Patient reports vomiting this morning.  Patient denise any cold symptoms.  Patient denies fevers.

## 2021-04-11 NOTE — ED Provider Notes (Addendum)
[facility]    CSN: [id number] Arrival date & time: 04/11/21  4627      History   Chief Complaint Chief Complaint  Patient presents with  . Headache  . Epigastric pain   HPI   75 year old female presents with the above complaints.  Symptoms started yesterday.  She reports that he has had some epigastric pain.  She has also had headache.  She is had 1 episode of nausea and vomiting.  No known inciting factor.  No relieving factors.  She does note some diarrhea as well.  No fever.  Patient reports that she has had some ongoing weight loss since January.  She has difficulty quantifying the amount.  No other reported symptoms.  No other complaints at this time.  Past Medical History:  Diagnosis Date  . AICD (automatic cardioverter/defibrillator) present   . Anemia   . Anxiety   . CHF (congestive heart failure) ([place])   . Complication of anesthesia   . Depression   . Diabetes ([place])   . Dilated cardiomyopathy ([place])   . Diverticulosis   . Dyspnea   . Dysrhythmia   . Edema    FEET/ANKLES OCCAS  . GERD (gastroesophageal reflux disease)   . Hyperlipidemia   . Hypothyroidism   . LV dysfunction   . Orthopnea    USES 3 PILLOWS  . PONV (postoperative nausea and vomiting)   . Reflux   . Thrombocytopenia ([place])   . Thyroid disease   . VHD (valvular heart disease)     Patient Active Problem List   Diagnosis Date Noted  . Excessive daytime sleepiness 02/18/2021  . Presence of automatic implantable cardioverter-defibrillator 02/18/2021  . Thrombocytopenia ([place]) 06/11/2019  . Pain due to onychomycosis of toenail of right foot 05/30/2019  . Memory loss 07/12/2016  . GERD (gastroesophageal reflux disease) 08/24/2015  . Automatic implantable cardioverter-defibrillator in situ 07/29/2015  . Hypotension 06/18/2015  . Benign essential HTN 03/20/2015  . Absolute anemia 03/11/2015  . Abnormal liver enzymes 03/11/2015  . Decreased potassium in the blood 03/11/2015  .  Hypercholesteremia 03/11/2015  . Adult hypothyroidism 03/11/2015  . Idiopathic insomnia 03/11/2015  . OP (osteoporosis) 03/11/2015  . Diabetes mellitus, type 2 ([place]) 03/11/2015  . Vitamin D deficiency 03/11/2015  . LBBB (left bundle branch block) 02/11/2015  . Chronic systolic heart failure ([place]) 09/25/2014  . Heart valve disease 04/10/2014    Past Surgical History:  Procedure Laterality Date  . ABDOMINAL HYSTERECTOMY    . BI-VENTRICULAR IMPLANTABLE CARDIOVERTER DEFIBRILLATOR [name] 02/11/2015   Procedure: BI-VENTRICULAR IMPLANTABLE CARDIOVERTER DEFIBRILLATOR  (CRT-D);  Surgeon: Deboraha Sprang, MD;  Location: [facility] CATH LAB;  Service: Cardiovascular;  Laterality: [name];  . BONE MARROW BIOPSY  2007   spine  . BREAST BIOPSY Right    neg  . CARDIAC CATHETERIZATION    . CATARACT EXTRACTION W/PHACO Right 01/03/2017   Procedure: CATARACT EXTRACTION PHACO AND INTRAOCULAR LENS PLACEMENT (IOC);  Surgeon: Leandrew Koyanagi, MD;  Location: [facility] ORS;  Service: Ophthalmology;  Laterality: Right;  [place] 00:50 AP% 19.5 CDE 9.94 fluid pack lot # [id number]  . CATARACT EXTRACTION W/PHACO Left 10/27/2016   Procedure: CATARACT EXTRACTION PHACO AND INTRAOCULAR LENS PLACEMENT (IOC);  Surgeon: Leandrew Koyanagi, MD;  Location: [facility] ORS;  Service: Ophthalmology;  Laterality: Left;  [place]  01:12 AP%  21.0 CDE  11.59 Fluid pack lot # [id number]  . CHOLECYSTECTOMY    . INSERT / REPLACE / REMOVE PACEMAKER     AICD  . LEAD REVISION [name]  02/11/2015   Procedure: LEAD REVISION;  Surgeon: Deboraha Sprang, MD;  Location: [facility] C[facility] LAB;  Service: Cardiovascular;  Laterality: [name];  . PARTIAL HYSTERECTOMY  1977  . TOOTH EXTRACTION  09/23/2015    OB History    Gravida  4   Para  4   Term      Preterm      AB      Living        SAB      IAB      Ectopic      Multiple      Live Births               Home Medications    Prior to Admission medications   Medication Sig Start Date End Date Taking? Authorizing  Provider  aspirin EC 81 MG tablet Take 81 mg by mouth daily.   Yes [provider]  atorvastatin (LIPITOR) 20 MG tablet TAKE 1 TABLET EVERY DAY 02/15/21  Yes Birdie Sons, MD  carvedilol (COREG) 3.125 MG tablet TAKE 1/2 TABLET BY MOUTH TWICE DAILY (DOSE CHANGE) 03/24/21  Yes Birdie Sons, MD  cyanocobalamin 100 MCG tablet Take 100 mcg by mouth daily.   Yes [provider]  dapagliflozin propanediol (FARXIGA) 5 MG TABS tablet Take 1 tablet (5 mg total) by mouth daily before breakfast. 07/02/20  Yes Birdie Sons, MD  ferrous sulfate 325 (65 FE) MG tablet Take 325 mg by mouth 2 (two) times daily with a meal.   Yes [provider]  furosemide (LASIX) 40 MG tablet Take 0.5-1 tablets (20-40 mg total) by mouth daily. 06/02/20  Yes Birdie Sons, MD  Multiple Vitamin (MULTIVITAMIN) tablet Take 1 tablet by mouth daily.   Yes [provider]  ondansetron (ZOFRAN ODT) 4 MG disintegrating tablet Take 1 tablet (4 mg total) by mouth every 8 (eight) hours as needed for nausea or vomiting. 04/11/21  Yes Glendine Swetz G, DO  pantoprazole (PROTONIX) 40 MG tablet Take 1 tablet (40 mg total) by mouth daily. 04/11/21  Yes Quinci Gavidia G, DO  sacubitril-valsartan (ENTRESTO) 24-26 MG Take 0.5 tablets by mouth 2 (two) times daily. 10/17/19  Yes [provider]  SYNTHROID 50 MCG tablet TAKE 1 TABLET EVERY DAY 02/15/21  Yes Birdie Sons, MD  acetaminophen (TYLENOL) 500 MG tablet Take 1,000 mg by mouth 2 (two) times daily as needed for mild pain or moderate pain.    [provider]  Alcohol Swabs (B-D SINGLE USE SWABS REGULAR) PADS USE TO CHECK BLOOD SUGAR DAILY 08/31/20   Birdie Sons, MD  bisacodyl (DULCOLAX) 5 MG EC tablet Take 5 mg by mouth daily as needed for moderate constipation.    [provider]  Blood Glucose Calibration (TRUE METRIX LEVEL 1) Low SOLN Use as directed with glucose meter 06/29/20   Birdie Sons, MD  chlorhexidine (PERIDEX)  0.12 % solution  12/09/20   [provider]  glucose blood (TRUE METRIX BLOOD GLUCOSE TEST) test strip TEST BLOOD SUGAR EVERY DAY 10/02/20   Birdie Sons, MD  senna (SENOKOT) 8.6 MG tablet Take 1 tablet by mouth daily as needed.    [provider]  TRUEplus Lancets 33G MISC CHECK BLOOD SUGAR ONE TIME DAILY 05/20/20   Birdie Sons, MD  JANUMET XR [id number] MG TB24 TAKE 1 TABLET EVERY DAY 02/15/21 04/11/21  Birdie Sons, MD    Family History Family History  Problem Relation Age of  Onset  . Heart disease Mother   . Heart attack Mother   . Prostate cancer Father   . Ulcers Father   . Stroke Sister   . Heart attack Sister   . Diabetes Sister   . Diabetes Sister   . Heart attack Brother   . Stroke Brother   . Breast cancer Neg Hx     Social History Social History   Tobacco Use  . Smoking status: Never Smoker  . Smokeless tobacco: Former Systems developer    Types: Snuff  Vaping Use  . Vaping Use: Never used  Substance Use Topics  . Alcohol use: No    Alcohol/week: 0.0 standard drinks  . Drug use: No     Allergies   Other and Sulfa antibiotics   Review of Systems Review of Systems Per HPI  Physical Exam Triage Vital Signs ED Triage Vitals  Enc Vitals Group     BP 04/11/21 1022 116/73     Pulse Rate 04/11/21 1022 74     Resp 04/11/21 1022 14     Temp 04/11/21 1022 98.2 F (36.8 C)     Temp Source 04/11/21 1022 Oral     SpO2 04/11/21 1022 100 %     Weight 04/11/21 1018 112 lb (50.8 kg)     Height 04/11/21 1018 _0  (1.549 m)     Head Circumference --      Peak Flow --      Pain Score 04/11/21 1018 8     Pain Loc --      Pain Edu? --      Excl. in [place]? --    Updated Vital Signs BP 116/73 (BP Location: Left Arm)   Pulse 74   Temp 98.2 F (36.8 C) (Oral)   Resp 14   Ht _1  (1.549 m)   Wt 50.8 kg   LMP  (LMP Unknown)   SpO2 100%   BMI 21.16 kg/m   Visual Acuity Right Eye Distance:   Left Eye Distance:   Bilateral Distance:    Right  Eye Near:   Left Eye Near:    Bilateral Near:     Physical Exam Vitals and nursing note reviewed.  Constitutional:      General: She is not in acute distress.    Appearance: She is not ill-appearing.  HENT:     Head: Normocephalic and atraumatic.  Eyes:     General:        Right eye: No discharge.        Left eye: No discharge.     Conjunctiva/sclera: Conjunctivae normal.  Cardiovascular:     Rate and Rhythm: Normal rate and regular rhythm.  Pulmonary:     Effort: Pulmonary effort is normal.     Breath sounds: Normal breath sounds. No wheezing, rhonchi or rales.  Abdominal:     Palpations: Abdomen is soft.     Comments: Nondistended.  Tender to palpation in the epigastric region.  Neurological:     Mental Status: She is alert.  Psychiatric:        Mood and Affect: Mood normal.        Behavior: Behavior normal.    UC Treatments / Results  Labs (all labs ordered are listed, but only abnormal results are displayed) Labs Reviewed - No data to display  EKG   Radiology No results found.  Procedures Procedures (including critical care time)  Medications Ordered in UC Medications - No data to display  Initial  Impression / Assessment and Plan / UC Course  I have reviewed the triage vital signs and the nursing notes.  Pertinent labs & imaging results that were available during my care of the patient were reviewed by me and considered in my medical decision making (see chart for details).    75 year old female presents with epigastric pain, nausea, vomiting. Etiology and prognosis unclear at this time. Appears fatigued.  Has mild epigastric discomfort on exam.  I do not have capabilities to obtain laboratory studies.  I am placing her on Protonix.  Zofran as needed.  I have advised her that she needs laboratory studies and an examination this week.  In the setting of her reported weight loss, I am concerned.  She may need endoscopy.  Final Clinical Impressions(s) / UC  Diagnoses   Final diagnoses:  Abdominal pain, epigastric     Discharge Instructions     Medication as prescribed.  If you worsen, please go to the ER.   I have reached out to Dr. Caryn Section. I recommend you see him this week.  Take care  Dr. Lacinda Axon    ED Prescriptions    Medication Sig Dispense Auth. Provider   pantoprazole (PROTONIX) 40 MG tablet Take 1 tablet (40 mg total) by mouth daily. 30 tablet Nichoals Heyde G, DO   ondansetron (ZOFRAN ODT) 4 MG disintegrating tablet Take 1 tablet (4 mg total) by mouth every 8 (eight) hours as needed for nausea or vomiting. 20 tablet Coral Spikes, DO     PDMP not reviewed this encounter.    Coral Spikes, [place] 04/11/21 1114

## 2021-04-12 ENCOUNTER — Other Ambulatory Visit: Payer: Self-pay | Admitting: Family Medicine

## 2021-04-12 ENCOUNTER — Ambulatory Visit (INDEPENDENT_AMBULATORY_CARE_PROVIDER_SITE_OTHER): Payer: Medicare HMO | Admitting: Family Medicine

## 2021-04-12 ENCOUNTER — Encounter: Payer: Self-pay | Admitting: Family Medicine

## 2021-04-12 ENCOUNTER — Telehealth: Payer: Self-pay | Admitting: Family Medicine

## 2021-04-12 VITALS — BP 119/65 | HR 70 | Wt 116.0 lb

## 2021-04-12 DIAGNOSIS — I5022 Chronic systolic (congestive) heart failure: Secondary | ICD-10-CM

## 2021-04-12 DIAGNOSIS — R1013 Epigastric pain: Secondary | ICD-10-CM

## 2021-04-12 DIAGNOSIS — E119 Type 2 diabetes mellitus without complications: Secondary | ICD-10-CM

## 2021-04-12 MED ORDER — JANUMET XR 100-1000 MG PO TB24
1.0000 | ORAL_TABLET | Freq: Every day | ORAL | Status: DC
Start: 2021-04-12 — End: 2021-05-31

## 2021-04-12 NOTE — Telephone Encounter (Signed)
This patient was seen Urgent Care this weekend for weakness and needs follow up within a couple of days. Can you contact patient and schedule visit this week. She can have any of my blocked acute visit slots. Thanks.

## 2021-04-12 NOTE — Telephone Encounter (Signed)
Patient is coming in 04/12/21 for OV.

## 2021-04-12 NOTE — Progress Notes (Signed)
Established patient visit   Patient: Bethany [name]   DOB: Mar 13, 1946   75 y.o. Female  MRN: [id number] Visit Date: 04/12/2021  Today's healthcare provider: [name], MD   Chief Complaint  Patient presents with  . Abdominal Pain   Subjective    Abdominal Pain This is a new problem. The current episode started in the past 7 days. The problem has been waxing and waning. The pain is located in the epigastric region. The abdominal pain does not radiate. Associated symptoms include constipation and nausea. Pertinent negatives include no diarrhea, fever, frequency or vomiting. Nothing aggravates the pain. She has tried nothing for the symptoms.   She called last week and advised to put Janumet on hold. Sx persisted and went to Urgent care this week and put on pantoprazole. She has started back on Janumet.      Medications: Outpatient Medications Prior to Visit  Medication Sig  . acetaminophen (TYLENOL) 500 MG tablet Take 1,000 mg by mouth 2 (two) times daily as needed for mild pain or moderate pain.  . Alcohol Swabs (B-D SINGLE USE SWABS REGULAR) PADS USE TO CHECK BLOOD SUGAR DAILY  . aspirin EC 81 MG tablet Take 81 mg by mouth daily.  Marland Kitchen atorvastatin (LIPITOR) 20 MG tablet TAKE 1 TABLET EVERY DAY  . bisacodyl (DULCOLAX) 5 MG EC tablet Take 5 mg by mouth daily as needed for moderate constipation.  . Blood Glucose Calibration (TRUE METRIX LEVEL 1) Low SOLN Use as directed with glucose meter  . carvedilol (COREG) 3.125 MG tablet TAKE 1/2 TABLET BY MOUTH TWICE DAILY (DOSE CHANGE)  . cyanocobalamin 100 MCG tablet Take 100 mcg by mouth daily.  . dapagliflozin propanediol (FARXIGA) 5 MG TABS tablet Take 1 tablet (5 mg total) by mouth daily before breakfast.  . ferrous sulfate 325 (65 FE) MG tablet Take 325 mg by mouth 2 (two) times daily with a meal.  . furosemide (LASIX) 40 MG tablet Take 0.5-1 tablets (20-40 mg total) by mouth daily.  Marland Kitchen glucose blood (TRUE METRIX BLOOD GLUCOSE  TEST) test strip TEST BLOOD SUGAR EVERY DAY  . Multiple Vitamin (MULTIVITAMIN) tablet Take 1 tablet by mouth daily.  . ondansetron (ZOFRAN ODT) 4 MG disintegrating tablet Take 1 tablet (4 mg total) by mouth every 8 (eight) hours as needed for nausea or vomiting.  . pantoprazole (PROTONIX) 40 MG tablet Take 1 tablet (40 mg total) by mouth daily.  . sacubitril-valsartan (ENTRESTO) 24-26 MG Take 0.5 tablets by mouth 2 (two) times daily.  Marland Kitchen SYNTHROID 50 MCG tablet TAKE 1 TABLET EVERY DAY  . TRUEplus Lancets 33G MISC CHECK BLOOD SUGAR ONE TIME DAILY  . chlorhexidine (PERIDEX) 0.12 % solution  (Patient not taking: Reported on 04/12/2021)  . senna (SENOKOT) 8.6 MG tablet Take 1 tablet by mouth daily as needed. (Patient not taking: Reported on 04/12/2021)   No facility-administered medications prior to visit.    Review of Systems  Constitutional: Positive for fatigue. Negative for activity change, appetite change, chills, diaphoresis, fever and unexpected weight change.  Respiratory: Negative.   Cardiovascular: Positive for leg swelling. Negative for chest pain and palpitations.  Gastrointestinal: Positive for abdominal pain, constipation and nausea. Negative for abdominal distention, anal bleeding, blood in stool, diarrhea, rectal pain and vomiting.  Genitourinary: Negative for frequency.       Objective    BP 119/65 (BP Location: Right Arm, Patient Position: Sitting, Cuff Size: Normal)   Pulse 70   Wt 116 lb (  52.6 kg)   LMP  (LMP Unknown)   SpO2 100%   BMI 21.92 kg/m    Physical Exam  General appearance: Well developed, well nourished female, cooperative and in no acute distress Head: Normocephalic, without obvious abnormality, atraumatic Respiratory: Respirations even and unlabored, normal respiratory rate Abd: soft, no masses. Slight epigastric tenderness.  Neurologic: Mental status: Alert, oriented to person, place, and time, thought content appropriate.     Assessment & Plan      1. Epigastric pain Slight improvement since starting patoprazole.  - CBC with Differential/Platelet - Comprehensive metabolic panel - H. pylori breath test - Lipase       The entirety of the information documented in the History of Present Illness, Review of Systems and Physical Exam were personally obtained by me. Portions of this information were initially documented by the CMA and reviewed by me for thoroughness and accuracy.      [name], MD  [facility] [phone] (phone) [phone] (fax)  [facility]

## 2021-04-13 LAB — COMPREHENSIVE METABOLIC PANEL
ALT: 21 IU/L (ref 0–32)
AST: 22 IU/L (ref 0–40)
Albumin/Globulin Ratio: 1.7 (ref 1.2–2.2)
Albumin: 4.3 g/dL (ref 3.7–4.7)
Alkaline Phosphatase: 89 IU/L (ref 44–121)
BUN/Creatinine Ratio: 10 — ABNORMAL LOW (ref 12–28)
BUN: 9 mg/dL (ref 8–27)
Bilirubin Total: 0.3 mg/dL (ref 0.0–1.2)
CO2: 24 mmol/L (ref 20–29)
Calcium: 9.4 mg/dL (ref 8.7–10.3)
Chloride: 101 mmol/L (ref 96–106)
Creatinine, Ser: 0.89 mg/dL (ref 0.57–1.00)
Globulin, Total: 2.6 g/dL (ref 1.5–4.5)
Glucose: 197 mg/dL — ABNORMAL HIGH (ref 65–99)
Potassium: 4.1 mmol/L (ref 3.5–5.2)
Sodium: 140 mmol/L (ref 134–144)
Total Protein: 6.9 g/dL (ref 6.0–8.5)
eGFR: 68 mL/min/{1.73_m2} (ref >59)

## 2021-04-13 LAB — CBC WITH DIFFERENTIAL/PLATELET
Basophils Absolute: 0 10*3/uL (ref 0.0–0.2)
Basos: 0 %
EOS (ABSOLUTE): 0 10*3/uL (ref 0.0–0.4)
Eos: 1 %
Hematocrit: 36.7 % (ref 34.0–46.6)
Hemoglobin: 12.2 g/dL (ref 11.1–15.9)
Immature Grans (Abs): 0 10*3/uL (ref 0.0–0.1)
Immature Granulocytes: 0 %
Lymphocytes Absolute: 1.3 10*3/uL (ref 0.7–3.1)
Lymphs: 37 %
MCH: 29.2 pg (ref 26.6–33.0)
MCHC: 33.2 g/dL (ref 31.5–35.7)
MCV: 88 fL (ref 79–97)
Monocytes Absolute: 0.3 10*3/uL (ref 0.1–0.9)
Monocytes: 9 %
Neutrophils Absolute: 1.9 10*3/uL (ref 1.4–7.0)
Neutrophils: 53 %
Platelets: 110 10*3/uL — ABNORMAL LOW (ref 150–450)
RBC: 4.18 x10E6/uL (ref 3.77–5.28)
RDW: 12.3 % (ref 11.7–15.4)
WBC: 3.6 10*3/uL (ref 3.4–10.8)

## 2021-04-13 LAB — H. PYLORI BREATH TEST: H pylori Breath Test: NEGATIVE

## 2021-04-13 LAB — LIPASE: Lipase: 30 U/L (ref 14–85)

## 2021-04-22 ENCOUNTER — Ambulatory Visit (INDEPENDENT_AMBULATORY_CARE_PROVIDER_SITE_OTHER): Payer: Medicare HMO | Admitting: Neurology

## 2021-04-22 ENCOUNTER — Other Ambulatory Visit: Payer: Self-pay

## 2021-04-22 DIAGNOSIS — E1169 Type 2 diabetes mellitus with other specified complication: Secondary | ICD-10-CM

## 2021-04-22 DIAGNOSIS — F5101 Primary insomnia: Secondary | ICD-10-CM

## 2021-04-22 DIAGNOSIS — G4710 Hypersomnia, unspecified: Secondary | ICD-10-CM

## 2021-04-22 DIAGNOSIS — I5022 Chronic systolic (congestive) heart failure: Secondary | ICD-10-CM

## 2021-04-22 DIAGNOSIS — G4719 Other hypersomnia: Secondary | ICD-10-CM

## 2021-04-22 DIAGNOSIS — Z95810 Presence of automatic (implantable) cardiac defibrillator: Secondary | ICD-10-CM

## 2021-04-25 NOTE — Procedures (Signed)
PATIENT'S NAME:  Bethany [name], Bethany [name] DOB:      [birth year]      MR#:    [id number]     DATE OF RECORDING: 04/22/2021 REFERRING M.D.:  [name], MD Study Performed:   Baseline Polysomnogram HISTORY:  Bethany [name] is a 75 y.o. African American female patient and seen upon a referral and sleep consultation request from [name], visit  dated  02/18/2021 .   Bethany [name] is seen here today in a consultation on 18 February 2021 with a complaint of increasing fatigue and daytime sleepiness.  Several times a week she feels that she has the overwhelming urge to sleep- often while she is driving.  A sleep study had been obtained about 7-10 years ago and was at the time negative for sleep apnea.   The patient has a significant cardiac history, she has a cardiomyopathy   ( diagnosed in 2016) which has led to congestive heart failure and irregular heartbeats and she needed an implanted defibrillator-pacemaker.   She also reports that she does not get a whole lot of sleep she may only get 4 or 5 hours of sleep, often fragmented.  It is no trouble to fall asleep but to maintain and sustain sleep. Nocturia:  she will have go to the bathroom, but most nights is going right back to sleep.      Berenice Bouton Gaumond has a medical history of AICD (automatic cardioverter/defibrillator) present, Anemia, Anxiety, CHF (congestive heart failure) (HCC), Complication of anesthesia, Depression, Diabetes (HCC), Dilated cardiomyopathy (HCC), Diverticulosis, Dyspnea, Dysrhythmia, Edema, GERD (gastroesophageal reflux disease), Hyperlipidemia, Hypothyroidism, LV dysfunction, Orthopnea, PONV (postoperative nausea and vomiting), Reflux, Thrombocytopenia (HCC), Thyroid disease, and VHD (valvular heart disease).   The patient endorsed the Epworth Sleepiness Scale at 16/24 points.   The patient's weight 118 pounds with a height of 61 (inches), resulting in a BMI of 22.5 kg/m2. The patient's neck circumference measured 13  inches.  CURRENT MEDICATIONS: Tylenol, Aspirin, Lipitor, Dulcolax, Coreg, Cyanocobalamin, Farxiga, Ferrous Sulfate, Lasix, Synthroid, Magnesium, Multivitamin, Entresto, Janumet XR   PROCEDURE:  This is a multichannel digital polysomnogram utilizing the Somnostar 11.2 system.  Electrodes and sensors were applied and monitored per AASM Specifications.   EEG, EOG, Chin and Limb EMG, were sampled at 200 Hz.  ECG, Snore and Nasal Pressure, Thermal Airflow, Respiratory Effort, CPAP Flow and Pressure, Oximetry was sampled at 50 Hz. Digital video and audio were recorded.      BASELINE STUDY: Lights Out was at 22:00 and Lights On at 05:06.  Total recording time (TRT) was 426.5 minutes, with a total sleep time (TST) of 396.5 minutes.   The patient's sleep latency was 5.5 minutes.  REM latency was 247 minutes.  The sleep efficiency was 93.1 %.     SLEEP ARCHITECTURE: WASO (Wake after sleep onset) was 24.5 minutes.  There were 29.5 minutes in Stage N1, 256.5 minutes Stage N2, 83.5 minutes Stage N3 and 27 minutes in Stage REM.  The percentage of Stage N1 was 7.4%, Stage N2 was 64.7%, Stage N3 was 21.1% and Stage R (REM sleep) was 6.8%.     RESPIRATORY ANALYSIS:  There were a total of 4 respiratory events:  0 obstructive apneas, 2 central apneas and 0 mixed apneas with only 2 hypopneas .     The total APNEA/HYPOPNEA INDEX (AHI) was 0.6/hour.   No (0) events occurred in REM sleep and 6 events in NREM. The REM AHI was 0 /hour, versus a non-REM AHI  of 0.6/h.  \The patient spent 17 minutes of total sleep time in the supine position and 380 minutes in non-supine. The supine AHI was 0.0 versus a non-supine AHI of 0.6.  OXYGEN SATURATION & C02:  The Wake baseline 02 saturation was 98%, with the lowest being 91%. Time spent below 89% saturation equaled 0 minutes.  The arousals were noted as: 26 were spontaneous, 0 were associated with PLMs, 0 were associated with respiratory events. The patient had a total of 0  Periodic Limb Movements.    Audio and video analysis did not show any abnormal or unusual movements, behaviors, phonations or vocalizations.   Paced EKG rhythm.   IMPRESSION:  There was a very high sleep efficiency noted and no Sleep apnea of clinical significance. No sleep hypoxemia was noted.  No Periodic Limb Movement Disorder (PLMD)  RECOMMENDATIONS: Further investigation with cardiology, endocrinology.   Sleep apnea is not the cause of the patient's excessive daytime sleepiness and fatigue, and neither hypoxemia, PLMs or REM behavior (dream enactment).  The patient slept in a non-adjustable bed- slept on 2 pillows.    I certify that I have reviewed the entire raw data recording prior to the issuance of this report in accordance with the Standards of Accreditation of the American Academy of Sleep Medicine (AASM)   Melvyn Novas, MD Medical Director, [facility], American Board of Neurology and Sleep Medicine (Neurology and Sleep Medicine)

## 2021-04-25 NOTE — Progress Notes (Signed)
Audio and video analysis did not show any abnormal or unusual movements, behaviors, phonations or vocalizations.   Paced EKG rhythm.   IMPRESSION:  1. There was a very high sleep efficiency noted and no Sleep apnea of clinical significance. 2. No sleep hypoxemia was noted. 3. No Periodic Limb Movement Disorder (PLMD)  RECOMMENDATIONS: Further investigation with cardiology, endocrinology.  1. Sleep apnea is not the cause of the patient's excessive daytime sleepiness and fatigue, and neither hypoxemia, PLMs or REM behavior (dream enactment). 2. The patient slept in a non-adjustable bed- slept on 2 pillows.

## 2021-04-25 NOTE — Progress Notes (Signed)
Audio and video analysis did not show any abnormal or unusual movements, behaviors, phonations or vocalizations.   Paced EKG rhythm.   IMPRESSION:  1. There was a very high sleep efficiency noted and no Sleep apnea of clinical significance. 2. No sleep hypoxemia was noted. 3. No Periodic Limb Movement Disorder (PLMD)  RECOMMENDATIONS: Further investigation with cardiology, endocrinology.  1. Sleep apnea is not the cause of the patient's excessive daytime sleepiness and fatigue, and neither hypoxemia, PLMs or REM behavior (dream enactment). 2. The patient slept in a non-adjustable bed- slept on 2 pillows. No oxygen was needed.

## 2021-04-26 ENCOUNTER — Ambulatory Visit: Payer: Self-pay | Admitting: Family Medicine

## 2021-04-27 ENCOUNTER — Telehealth: Payer: Self-pay

## 2021-04-27 NOTE — Telephone Encounter (Signed)
Patient returned my call.  I discussed her sleep study results and recommendations.  Patient will follow-up with her primary care physician.  Patient verbalized understanding of results.  Patient had no questions or concerns.

## 2021-04-27 NOTE — Telephone Encounter (Signed)
-----   Message from Melvyn Novas, MD sent at 04/25/2021  6:33 PM EDT ----- Audio and video analysis did not show any abnormal or unusual movements, behaviors, phonations or vocalizations.   Paced EKG rhythm.   IMPRESSION:  1. There was a very high sleep efficiency noted and no Sleep apnea of clinical significance. 2. No sleep hypoxemia was noted. 3. No Periodic Limb Movement Disorder (PLMD)  RECOMMENDATIONS: Further investigation with cardiology, endocrinology.  1. Sleep apnea is not the cause of the patient's excessive daytime sleepiness and fatigue, and neither hypoxemia, PLMs or REM behavior (dream enactment). 2. The patient slept in a non-adjustable bed- slept on 2 pillows. No oxygen was needed.

## 2021-04-27 NOTE — Telephone Encounter (Signed)
I called patient to discuss her sleep study results.  Patient sister answered the phone.  There is no one listed on patient's DPR.  Patient's sister will ask patient to call me back.

## 2021-04-30 NOTE — Progress Notes (Signed)
Remote ICD transmission.   

## 2021-05-12 ENCOUNTER — Other Ambulatory Visit: Payer: Self-pay | Admitting: Family Medicine

## 2021-05-12 DIAGNOSIS — Z1231 Encounter for screening mammogram for malignant neoplasm of breast: Secondary | ICD-10-CM

## 2021-05-17 ENCOUNTER — Ambulatory Visit: Payer: Self-pay | Admitting: Family Medicine

## 2021-05-24 ENCOUNTER — Ambulatory Visit (INDEPENDENT_AMBULATORY_CARE_PROVIDER_SITE_OTHER): Payer: Medicare HMO | Admitting: Family Medicine

## 2021-05-24 ENCOUNTER — Other Ambulatory Visit: Payer: Self-pay

## 2021-05-24 ENCOUNTER — Encounter: Payer: Self-pay | Admitting: Family Medicine

## 2021-05-24 VITALS — BP 95/60 | HR 81 | Resp 16 | Ht 61.0 in | Wt 116.0 lb

## 2021-05-24 DIAGNOSIS — E1169 Type 2 diabetes mellitus with other specified complication: Secondary | ICD-10-CM

## 2021-05-24 LAB — POCT GLYCOSYLATED HEMOGLOBIN (HGB A1C)
Est. average glucose Bld gHb Est-mCnc: 148
Hemoglobin A1C: 6.8 % — AB (ref 4.0–5.6)

## 2021-05-24 NOTE — Progress Notes (Signed)
Established patient visit   Patient: Bethany [name]   DOB: February 23, 1946   75 y.o. Female  MRN: [id number] Visit Date: 05/24/2021  Today's healthcare provider: [name], MD   Chief Complaint  Patient presents with   Diabetes   Subjective    HPI  Diabetes Mellitus Type II, Follow-up  Lab Results  Component Value Date   HGBA1C 6.8 (A) 05/24/2021   HGBA1C 6.9 (A) 01/12/2021   HGBA1C 7.7 (A) 09/14/2020   Wt Readings from Last 3 Encounters:  05/24/21 116 lb (52.6 kg)  04/12/21 116 lb (52.6 kg)  04/11/21 112 lb (50.8 kg)   Last seen for diabetes 4 months ago.  Management since then includes no medication changes. She reports good compliance with treatment. She is not having side effects.  Symptoms: No fatigue No foot ulcerations  No appetite changes No nausea  No paresthesia of the feet  No polydipsia  No polyuria No visual disturbances   No vomiting     Home blood sugar records: fasting range: 110s  Episodes of hypoglycemia? No    Current insulin regiment: none  Most Recent Eye Exam: due Current exercise: no regular exercise Current diet habits: well balanced  Pertinent Labs: Lab Results  Component Value Date   CHOL 120 01/13/2021   HDL 55 01/13/2021   LDLCALC 49 01/13/2021   TRIG 79 01/13/2021   CHOLHDL 2.2 01/13/2021   Lab Results  Component Value Date   NA 140 04/12/2021   K 4.1 04/12/2021   CREATININE 0.89 04/12/2021   GFRNONAA 69 07/01/2020   GFRAA 79 07/01/2020   GLUCOSE 197 (H) 04/12/2021     Hypothyroid, follow-up  Lab Results  Component Value Date   TSH 2.850 01/13/2021   TSH 2.920 06/02/2020   TSH 1.070 12/30/2019   FREET4 1.28 01/13/2021   FREET4 1.43 03/13/2019   T4TOTAL 10.1 05/12/2017   Wt Readings from Last 3 Encounters:  05/24/21 116 lb (52.6 kg)  04/12/21 116 lb (52.6 kg)  04/11/21 112 lb (50.8 kg)    She was last seen for hypothyroid 4 months ago.  Management since that visit includes no medication. She  reports good compliance with treatment. She is not having side effects.   Symptoms: No change in energy level No constipation  No diarrhea No heat / cold intolerance  No nervousness No palpitations  No weight changes         Medications: Outpatient Medications Prior to Visit  Medication Sig   acetaminophen (TYLENOL) 500 MG tablet Take 1,000 mg by mouth 2 (two) times daily as needed for mild pain or moderate pain.   Alcohol Swabs (B-D SINGLE USE SWABS REGULAR) PADS USE TO CHECK BLOOD SUGAR DAILY   aspirin EC 81 MG tablet Take 81 mg by mouth daily.   atorvastatin (LIPITOR) 20 MG tablet TAKE 1 TABLET EVERY DAY   Blood Glucose Calibration (TRUE METRIX LEVEL 1) Low SOLN Use as directed with glucose meter   carvedilol (COREG) 3.125 MG tablet TAKE 1/2 TABLET BY MOUTH TWICE DAILY (DOSE CHANGE)   cyanocobalamin 100 MCG tablet Take 100 mcg by mouth daily.   FARXIGA 5 MG TABS tablet TAKE 1 TABLET (5 MG TOTAL) DAILY BEFORE BREAKFAST   ferrous sulfate 325 (65 FE) MG tablet Take 325 mg by mouth 2 (two) times daily with a meal.   furosemide (LASIX) 40 MG tablet Take 0.5-1 tablets (20-40 mg total) by mouth daily.   glucose blood (TRUE METRIX BLOOD GLUCOSE  TEST) test strip TEST BLOOD SUGAR EVERY DAY   Multiple Vitamin (MULTIVITAMIN) tablet Take 1 tablet by mouth daily.   pantoprazole (PROTONIX) 40 MG tablet Take 1 tablet (40 mg total) by mouth daily.   sacubitril-valsartan (ENTRESTO) 24-26 MG Take 0.5 tablets by mouth 2 (two) times daily.   SitaGLIPtin-MetFORMIN HCl (JANUMET XR) [phone] MG TB24 Take 1 tablet by mouth daily.   SYNTHROID 50 MCG tablet TAKE 1 TABLET EVERY DAY   TRUEplus Lancets 33G MISC CHECK BLOOD SUGAR ONE TIME DAILY   bisacodyl (DULCOLAX) 5 MG EC tablet Take 5 mg by mouth daily as needed for moderate constipation.   ondansetron (ZOFRAN ODT) 4 MG disintegrating tablet Take 1 tablet (4 mg total) by mouth every 8 (eight) hours as needed for nausea or vomiting.   senna (SENOKOT) 8.6 MG  tablet Take 1 tablet by mouth daily as needed. (Patient not taking: No sig reported)   No facility-administered medications prior to visit.    Review of Systems  Constitutional:  Negative for activity change and fatigue.  Respiratory:  Negative for cough and shortness of breath.   Cardiovascular:  Negative for chest pain, palpitations and leg swelling.  Endocrine: Negative for cold intolerance, heat intolerance, polydipsia, polyphagia and polyuria.  Neurological:  Negative for dizziness, light-headedness and headaches.       Objective    BP 95/60   Pulse 81   Resp 16   Ht 5\' 1"  (1.549 m)   Wt 116 lb (52.6 kg)   LMP  (LMP Unknown)   BMI 21.92 kg/m      Physical Exam   General: Appearance:    Well developed, well nourished female in no acute distress  Eyes:    PERRL, conjunctiva/corneas clear, EOM's intact       Lungs:     Clear to auscultation bilaterally, respirations unlabored  Heart:    Normal heart rate. Normal rhythm.  2/6 mid-systolic murmur   MS:   All extremities are intact.    Neurologic:   Awake, alert, oriented x 3. No apparent focal neurological defect.        Results for orders placed or performed in visit on 05/24/21  POCT glycosylated hemoglobin (Hb A1C)  Result Value Ref Range   Hemoglobin A1C 6.8 (A) 4.0 - 5.6 %   Est. average glucose Bld gHb Est-mCnc 148     Assessment & Plan     1. Type 2 diabetes mellitus with other specified complication, without long-term current use of insulin (HCC)  Well controlled.  Continue current medications.    Follow up 4-5 months.        The entirety of the information documented in the History of Present Illness, Review of Systems and Physical Exam were personally obtained by me. Portions of this information were initially documented by the CMA and reviewed by me for thoroughness and accuracy.     05/26/21, MD  [facility] [phone] (phone) [phone] (fax)  [facility]

## 2021-05-25 ENCOUNTER — Telehealth: Payer: Self-pay | Admitting: *Deleted

## 2021-05-25 NOTE — Chronic Care Management (AMB) (Signed)
  Care Management   Note  05/25/2021 Name: Bethany [name] MRN: [id number] DOB: [birth year]  Bethany [name] is a 75 y.o. year old female who is a primary care patient of Sherrie Mustache, Demetrios Isaacs, MD and is actively engaged with the care management team. I reached out to Leanora Cover by phone today to assist with re-scheduling a follow up visit with the Pharmacist  Follow up plan: Unsuccessful telephone outreach attempt made. A HIPAA compliant phone message was left for the patient providing contact information and requesting a return call.  The care management team will reach out to the patient again over the next 7 days.  If patient returns call to provider office, please advise to call Embedded Care Management Care Guide Gwenevere Ghazi at [phone].  Gwenevere Ghazi  Care Guide, Embedded Care Coordination [facility]

## 2021-05-27 NOTE — Chronic Care Management (AMB) (Signed)
  Care Management   Note  05/27/2021 Name: Bethany [name] MRN: [id number] DOB: Mar 17, 1946  Bethany [name] is a 75 y.o. year old female who is a primary care patient of Sherrie Mustache, Demetrios Isaacs, MD and is actively engaged with the care management team. I reached out to Leanora Cover by phone today to assist with re-scheduling a follow up visit with the Pharmacist  Follow up plan: Telephone appointment with care management team member scheduled for:07/02/2021  [facility] Guide, Embedded Care Coordination [facility]  Care Management  Direct Dial: [phone]

## 2021-05-30 ENCOUNTER — Other Ambulatory Visit: Payer: Self-pay | Admitting: Family Medicine

## 2021-05-30 DIAGNOSIS — E119 Type 2 diabetes mellitus without complications: Secondary | ICD-10-CM

## 2021-05-30 NOTE — Telephone Encounter (Signed)
Requested Prescriptions  Pending Prescriptions Disp Refills  . TRUEplus Lancets 33G MISC [Pharmacy Med Name: TRUEPLUS LANCETS 33G] 100 each 4    Sig: CHECK BLOOD SUGAR ONE TIME DAILY     Endocrinology: Diabetes - Testing Supplies Passed - 05/30/2021  1:55 AM      Passed - Valid encounter within last 12 months    Recent Outpatient Visits          6 days ago Type 2 diabetes mellitus with other specified complication, without long-term current use of insulin [facility])   [facility] Malva Limes, MD   1 month ago Epigastric pain   [facility] Malva Limes, MD   4 months ago Type 2 diabetes mellitus with other specified complication, without long-term current use of insulin [facility])   [facility] Malva Limes, MD   8 months ago Type 2 diabetes mellitus without complication, without long-term current use of insulin [facility])   [facility] Malva Limes, MD   11 months ago Type 2 diabetes mellitus with other specified complication, without long-term current use of insulin [facility])   [facility] Malva Limes, MD      Future Appointments            In 3 months Fisher, Demetrios Isaacs, MD [facility]

## 2021-05-31 ENCOUNTER — Other Ambulatory Visit: Payer: Self-pay | Admitting: Family Medicine

## 2021-06-01 ENCOUNTER — Ambulatory Visit
Admission: RE | Admit: 2021-06-01 | Discharge: 2021-06-01 | Disposition: A | Discharge status: Home / Self Care | Payer: Medicare HMO | Source: Ambulatory Visit | Attending: Family Medicine | Admitting: Family Medicine

## 2021-06-01 ENCOUNTER — Other Ambulatory Visit: Payer: Self-pay

## 2021-06-01 DIAGNOSIS — Z1231 Encounter for screening mammogram for malignant neoplasm of breast: Secondary | ICD-10-CM | POA: Insufficient documentation

## 2021-06-03 ENCOUNTER — Other Ambulatory Visit: Payer: Self-pay

## 2021-06-03 ENCOUNTER — Encounter: Payer: Self-pay | Admitting: Podiatry

## 2021-06-03 ENCOUNTER — Ambulatory Visit: Payer: Medicare HMO | Admitting: Podiatry

## 2021-06-03 DIAGNOSIS — B351 Tinea unguium: Secondary | ICD-10-CM

## 2021-06-03 DIAGNOSIS — M79674 Pain in right toe(s): Secondary | ICD-10-CM

## 2021-06-03 DIAGNOSIS — E119 Type 2 diabetes mellitus without complications: Secondary | ICD-10-CM | POA: Diagnosis not present

## 2021-06-03 NOTE — Progress Notes (Signed)
This patient returns to my office for at risk foot care.  This patient requires this care by a professional since this patient will be at risk due to having type 2 diabetes and thrombocytopenia..  This patient is unable to cut nails herself since the patient cannot reach her nails.These nails are painful walking and wearing shoes.  This patient presents for at risk foot care today.  General Appearance  Alert, conversant and in no acute stress.  Vascular  Dorsalis pedis  are palpable  Bilaterally. Posterior tibial pulses are absent  B/L. Capillary return is within normal limits  bilaterally. Cold feet  Bilaterally.  Absent digital hair.  Neurologic  Senn-Weinstein monofilament wire test within normal limits  bilaterally. Muscle power within normal limits bilaterally.  Nails Thick disfigured discolored nails with subungual debris with incurvated nails both borders right hallux.. No evidence of bacterial infection or drainage bilaterally.  Orthopedic  No limitations of motion  feet .  No crepitus or effusions noted.  No bony pathology or digital deformities noted.  Skin  normotropic skin with no porokeratosis noted bilaterally.  No signs of infections or ulcers noted.     Onychomycosis  Pain in right toes  Pain in left toes  Consent was obtained for treatment procedures.   Mechanical debridement of nail right hallux. performed with a nail nipper.  Filed with dremel without incident.    Return office visit   3 months                   Told patient to return for periodic foot care and evaluation due to potential at risk complications.   Helane Gunther DPM

## 2021-06-04 ENCOUNTER — Telehealth: Payer: Self-pay

## 2021-06-04 ENCOUNTER — Telehealth: Payer: Self-pay | Admitting: Family Medicine

## 2021-06-04 DIAGNOSIS — R5383 Other fatigue: Secondary | ICD-10-CM

## 2021-06-04 DIAGNOSIS — I5022 Chronic systolic (congestive) heart failure: Secondary | ICD-10-CM

## 2021-06-04 NOTE — Telephone Encounter (Signed)
Pt called to see if Dr. Sherrie Mustache sent RX for  carvedilol (COREG) 3.125 MG to the pharmacy / Please advise  Pt stated she is ok and not sure if the pharmacy gave her the full 90 tabs in May / please advise

## 2021-06-04 NOTE — Telephone Encounter (Signed)
FYI. Thanks.

## 2021-06-04 NOTE — Telephone Encounter (Signed)
Waiting on approval from Dr. Sherrie Mustache.

## 2021-06-04 NOTE — Telephone Encounter (Signed)
Patient reports that she is taking 1 full tablet (3.125mg ) twice daily

## 2021-06-04 NOTE — Telephone Encounter (Signed)
Left message to call back. Ok for [facility] to ask below. Thanks!

## 2021-06-04 NOTE — Telephone Encounter (Signed)
Patient called, left VM to return the call to the office. 

## 2021-06-04 NOTE — Telephone Encounter (Signed)
Ok to send in carvedilol?

## 2021-06-04 NOTE — Telephone Encounter (Signed)
Our records have her taking 1/2 tablet twice a day, which would be 90. Is she taking it differently?

## 2021-06-04 NOTE — Telephone Encounter (Signed)
Pt is waiting on carvedilol 3.125 mg to come in from [employer] order and will needs #30 sent to [facility] in [place] [address] [facility] phone number [phone]

## 2021-06-04 NOTE — Telephone Encounter (Signed)
Pt is calling back and per [facility] md only sent in #90 of carvedilol 3.125 mg and she take 2 a day and will need #180 for 90 day supply

## 2021-06-07 MED ORDER — CARVEDILOL 3.125 MG PO TABS: 3.1250 mg | ORAL_TABLET | Freq: Two times a day (BID) | ORAL | 1 refills | Status: DC

## 2021-06-29 ENCOUNTER — Encounter: Payer: Self-pay | Admitting: Family Medicine

## 2021-07-02 ENCOUNTER — Ambulatory Visit (INDEPENDENT_AMBULATORY_CARE_PROVIDER_SITE_OTHER): Payer: Medicare HMO

## 2021-07-02 DIAGNOSIS — E1169 Type 2 diabetes mellitus with other specified complication: Secondary | ICD-10-CM

## 2021-07-02 DIAGNOSIS — I5022 Chronic systolic (congestive) heart failure: Secondary | ICD-10-CM

## 2021-07-02 NOTE — Patient Instructions (Signed)
Visit Information It was great speaking with you today!  Please let me know if you have any questions about our visit.   Goals Addressed             This Visit's Progress    Monitor and Manage My Blood Sugar-Diabetes Type 2       Timeframe:  Long-Range Goal Priority:  High Start Date: 07/02/2021                             Expected End Date: 07/02/2022                      Follow Up Date 08/06/2021    - check blood sugar at prescribed times - check blood sugar if I feel it is too high or too low - enter blood sugar readings and medication or insulin into daily log    Why is this important?   Checking your blood sugar at home helps to keep it from getting very high or very low.  Writing the results in a diary or log helps the doctor know how to care for you.  Your blood sugar log should have the time, date and the results.  Also, write down the amount of insulin or other medicine that you take.  Other information, like what you ate, exercise done and how you were feeling, will also be helpful.     Notes:      Track and Manage Fluids and Swelling-Heart Failure       Timeframe:  Long-Range Goal Priority:  High Start Date: 07/02/2021                             Expected End Date: 07/02/2022                      Follow Up Date 09/05/2021    - call office if I gain more than 2 pounds in one day or 5 pounds in one week - keep legs up while sitting - use salt in moderation - weigh myself daily    Why is this important?   It is important to check your weight daily and watch how much salt and liquids you have.  It will help you to manage your heart failure.    Notes:         Patient Care Plan: General Pharmacy (Adult)     Problem Identified: Hypertension, Hyperlipidemia, Diabetes, Heart Failure, GERD, Hypothyroidism, and Osteoporosis   Priority: High     Long-Range Goal: Patient-Specific Goal   Start Date: 07/02/2021  Expected End Date: 07/02/2022  This Visit's  Progress: On track  Priority: High  Note:   Current Barriers:  No barriers noted  Pharmacist Clinical Goal(s):  Patient will maintain control of diabetes as evidenced by A1c less than 8%  maintain control of heart failure as evidenced by stable weight, lack of exacerbations through collaboration with PharmD and provider.   Interventions: 1:1 collaboration with Malva Limes, MD regarding development and update of comprehensive plan of care as evidenced by provider attestation and co-signature Inter-disciplinary care team collaboration (see longitudinal plan of care) Comprehensive medication review performed; medication list updated in electronic medical record  Heart Failure (Goal: manage symptoms and prevent exacerbations) -Controlled -Last ejection fraction: 35% (Date: Mar 2017) -HF type: Systolic -NYHA Class: II (slight limitation of activity) -AHA  HF Stage: C (Heart disease and symptoms present) -Current treatment: Carvedilol 3.125 mg twice daily  Furosemide 40 mg 1/2-1 tablet daily  Entresto 24-26 1/2 tablet twice daily  -Medications previously tried: NA  -Educated on Importance of weighing daily; if you gain more than 3 pounds in one day or 5 pounds in one week, contact cardiology office  -Recommended to continue current medication  Hyperlipidemia: (LDL goal < 100) -Controlled -Current treatment: Atorvastatin 20 mg daily  -Medications previously tried: NA  -Recommended to continue current medication  Diabetes (A1c goal <8%) -Controlled -Current medications: Farxiga 5 mg daily  Janumet [phone] mg daily  -Medications previously tried: NA  -Current home glucose readings 22-Aug 122  21-Aug 122  20-Aug 110  19-Aug 105  18-Aug 96  17-Aug 106  16-Aug 110  15-Aug 112  14-Aug 106  13-Aug 91  12-Aug 101  11-Aug 96  10-Aug 103  9-Aug 100  8-Aug 105  7-Aug 95  Average 105  -Denies hypoglycemic/hyperglycemic symptoms -Current meal patterns: Trying to minimize  salt intake, carbohydrate intake  -Current exercise: Trying to get back into walking with sister -Recommended to continue current medication  Osteopenia (Goal Prevent bone fractures) -Controlled -Patient is not a candidate for pharmacologic treatment -Current treatment  None -Medications previously tried: NA  -Recommend [phone] units of vitamin D daily. -Recommended to continue current medication  Hypothyroidism (Goal: Maintain stable thyroid function) -Controlled -Current treatment  Synthroid 50 mcg daily  -Medications previously tried: NA  -Recommended to continue current medication  GERD (Goal: Prevent heartburn) -Controlled -Current treatment  Pantoprazole 40 mg daily -Medications previously tried: NA  -Recommended to continue current medication   Patient Goals/Self-Care Activities Patient will:  - check glucose daily before breakfast, document, and provide at future appointments check blood pressure weekly, document, and provide at future appointments weigh daily, and contact provider if weight gain of greater than 2 pounds in 24 hours   Follow Up Plan: Telephone follow up appointment with care management team member scheduled for:  01/07/2022 at 8:30 AM      Patient agreed to services and verbal consent obtained.   Patient verbalizes understanding of instructions provided today and agrees to view in MyChart.   Angelena Sole, PharmD, Patsy Baltimore, CPP  Clinical Pharmacist [facility] [phone]

## 2021-07-02 NOTE — Progress Notes (Signed)
Chronic Care Management Pharmacy Note  07/02/2021 Name:  Bethany [name] MRN:  [id number] DOB:  20-Sep-1946  Summary: Patient presents for CCM follow-up. Her blood sugars and weight have been stable at home. She has been trying to increase her activity level, but reports she has had very low energy.   Recommendations/Changes made from today's visit: Continue current medications  Plan: CPP follow-up 6 months  Subjective: Bethany [name] is an 75 y.o. year old female who is a primary patient of Fisher, Kirstie Peri, MD.  The CCM team was consulted for assistance with disease management and care coordination needs.    Engaged with patient by telephone for follow up visit in response to provider referral for pharmacy case management and/or care coordination services.   Consent to Services:  The patient was given information about Chronic Care Management services, agreed to services, and gave verbal consent prior to initiation of services.  Please see initial visit note for detailed documentation.   Patient Care Team: Birdie Sons, MD as PCP - General (Family Medicine) Corey Skains, MD as Consulting Physician (Cardiology) Deboraha Sprang, MD as Consulting Physician (Cardiology) Leandrew Koyanagi, MD as Referring Physician (Ophthalmology) Eulogio Bear, MD as Consulting Physician (Ophthalmology) Gardiner Barefoot, DPM as Consulting Physician (Podiatry) Germaine Pomfret, [facility] (Pharmacist)  Recent office visits: 05/24/21: Patient presented to Dr. Caryn Section for follow-up. A1c 6.8%  04/12/21: Patient presented to Dr. Caryn Section for epigastric pain.   Recent consult visits: 02/18/21: Patient presented to Dr. Brett Fairy (Neurology) for sleep study.  02/10/21: Patient presented to Dr. Nehemiah Massed (Cardiology)   Hospital visits: 04/11/21: Patient presented to ED for epigastric pain.    Objective:  Lab Results  Component Value Date   CREATININE 0.89 04/12/2021   BUN 9 04/12/2021   GFR 88.96  02/05/2015   GFRNONAA 69 07/01/2020   GFRAA 79 07/01/2020   NA 140 04/12/2021   K 4.1 04/12/2021   CALCIUM 9.4 04/12/2021   CO2 24 04/12/2021   GLUCOSE 197 (H) 04/12/2021    Lab Results  Component Value Date/Time   HGBA1C 6.8 (A) 05/24/2021 03:54 PM   HGBA1C 6.9 (A) 01/12/2021 11:57 AM   HGBA1C 8.2 (H) 04/04/2018 03:02 PM   HGBA1C 6.3 02/20/2014 07:57 PM   GFR 88.96 02/05/2015 10:34 AM   MICROALBUR negative 06/02/2020 10:36 AM   MICROALBUR negative 12/04/2018 11:50 AM    Last diabetic Eye exam:  Lab Results  Component Value Date/Time   HMDIABEYEEXA No Retinopathy 03/10/2020 12:00 AM    Last diabetic Foot exam: No results found for: HMDIABFOOTEX   Lab Results  Component Value Date   CHOL 120 01/13/2021   HDL 55 01/13/2021   LDLCALC 49 01/13/2021   TRIG 79 01/13/2021   CHOLHDL 2.2 01/13/2021    Hepatic Function Latest Ref Rng & Units 04/12/2021 01/13/2021 07/01/2020  Total Protein 6.0 - 8.5 g/dL 6.9 7.0 -  Albumin 3.7 - 4.7 g/dL 4.3 4.3 4.1  AST 0 - 40 IU/L 22 26 -  ALT 0 - 32 IU/L 21 15 -  Alk Phosphatase 44 - 121 IU/L 89 84 -  Total Bilirubin 0.0 - 1.2 mg/dL 0.3 0.3 -    Lab Results  Component Value Date/Time   TSH 2.850 01/13/2021 08:15 AM   TSH 2.920 06/02/2020 11:05 AM   FREET4 1.28 01/13/2021 08:15 AM   FREET4 1.43 03/13/2019 09:27 AM    CBC Latest Ref Rng & Units 04/12/2021 01/13/2021 06/02/2020  WBC 3.4 - 10.8  x10E3/uL 3.6 4.3 3.9  Hemoglobin 11.1 - 15.9 g/dL 12.2 12.2 10.7(L)  Hematocrit 34.0 - 46.6 % 36.7 37.5 33.3(L)  Platelets 150 - 450 x10E3/uL 110(L) 115(L) 126(L)    Lab Results  Component Value Date/Time   VD25OH 99.0 01/13/2021 08:15 AM   VD25OH 83.4 12/30/2019 03:48 PM    Clinical ASCVD: No  The ASCVD Risk score Mikey Bussing DC Jr., et al., 2013) failed to calculate for the following reasons:   The valid total cholesterol range is 130 to 320 mg/dL    Depression screen [facility] 2/9 04/12/2021 01/12/2021 09/14/2020  Decreased Interest 0 0 0  Down, Depressed,  Hopeless 0 0 0  PHQ - 2 Score 0 0 0  Altered sleeping _0 Tired, decreased energy _1 Change in appetite _2 Feeling bad or failure about yourself  0 0 0  Trouble concentrating 3 3 0  Moving slowly or fidgety/restless 0 0 1  Suicidal thoughts 0 0 0  PHQ-9 Score _3 Difficult doing work/chores Not difficult at all Not difficult at all Not difficult at all  Some recent data might be hidden    -Last DEXA Scan: 02/05/20   T-Score femoral neck: -1.8  T-Score total hip: -1.3  T-Score lumbar spine: -1.4  T-Score forearm radius: -0.8  10-year probability of major osteoporotic fracture: 8.1%  10-year probability of hip fracture: 1.6%  Social History   Tobacco Use  Smoking Status Never  Smokeless Tobacco Former   Types: Snuff   Quit date: 10/27/1969   BP Readings from Last 3 Encounters:  05/24/21 95/60  04/12/21 119/65  04/11/21 116/73   Pulse Readings from Last 3 Encounters:  05/24/21 81  04/12/21 70  04/11/21 74   Wt Readings from Last 3 Encounters:  05/24/21 116 lb (52.6 kg)  04/12/21 116 lb (52.6 kg)  04/11/21 112 lb (50.8 kg)   BMI Readings from Last 3 Encounters:  05/24/21 21.92 kg/m  04/12/21 21.92 kg/m  04/11/21 21.16 kg/m    Assessment/Interventions: Review of patient past medical history, allergies, medications, health status, including review of consultants reports, laboratory and other test data, was performed as part of comprehensive evaluation and provision of chronic care management services.   SDOH:  (Social Determinants of Health) assessments and interventions performed: Yes SDOH Interventions    Flowsheet Row Most Recent Value  SDOH Interventions   Financial Strain Interventions Intervention Not Indicated      SDOH Screenings   Alcohol Screen: Low Risk    Last Alcohol Screening Score (AUDIT): 0  Depression (PHQ2-9): Medium Risk   PHQ-2 Score: 12  Financial Resource Strain: Low Risk    Difficulty of Paying Living Expenses: Not  hard at all  Food Insecurity: Not on file  Housing: Not on file  Physical Activity: Not on file  Social Connections: Not on file  Stress: Not on file  Tobacco Use: Medium Risk   Smoking Tobacco Use: Never   Smokeless Tobacco Use: Former  Transport planner Needs: No Data processing manager (Medical): No   Lack of Transportation (Non-Medical): No    CCM Care Plan  Allergies  Allergen Reactions   Other Other (See Comments)    NO BLOOD PRODUCTS- JEHOVAHS WITNESS   Sulfa Antibiotics Other (See Comments)    Loss of taste    Medications Reviewed Today     Reviewed by Gardiner Barefoot, DPM (Physician) on 06/03/21 at 0854  Med List Status: <None>  Medication Order Taking? Sig Documenting Provider Last Dose Status Informant  acetaminophen (TYLENOL) 500 MG tablet [id number] Yes Take 1,000 mg by mouth 2 (two) times daily as needed for mild pain or moderate pain. [provider] Taking Active   Alcohol Swabs (B-D SINGLE USE SWABS REGULAR) PADS [id number] Yes USE TO CHECK BLOOD SUGAR DAILY Birdie Sons, MD Taking Active   aspirin EC 81 MG tablet [id number] Yes Take 81 mg by mouth daily. [provider] Taking Active Self  atorvastatin (LIPITOR) 20 MG tablet [id number] Yes TAKE 1 TABLET EVERY DAY Fisher, Kirstie Peri, MD Taking Active   bisacodyl (DULCOLAX) 5 MG EC tablet [id number]  Take 5 mg by mouth daily as needed for moderate constipation. [provider]  Active   Blood Glucose Calibration (TRUE METRIX LEVEL 1) Low SOLN [id number] Yes Use as directed with glucose meter Birdie Sons, MD Taking Active   carvedilol (COREG) 3.125 MG tablet [id number] Yes TAKE 1/2 TABLET BY MOUTH TWICE DAILY (DOSE CHANGE) Birdie Sons, MD Taking Active   cyanocobalamin 100 MCG tablet [id number] Yes Take 100 mcg by mouth daily. [provider] Taking Active   FARXIGA 5 MG TABS tablet [id number] Yes TAKE 1 TABLET (5 MG TOTAL) DAILY BEFORE BREAKFAST Fisher,  Kirstie Peri, MD Taking Active   ferrous sulfate 325 (65 FE) MG tablet [id number] Yes Take 325 mg by mouth 2 (two) times daily with a meal. [provider] Taking Active Self  furosemide (LASIX) 40 MG tablet [id number] Yes Take 0.5-1 tablets (20-40 mg total) by mouth daily. Birdie Sons, MD Taking Active   glucose blood (TRUE METRIX BLOOD GLUCOSE TEST) test strip [id number] Yes TEST BLOOD SUGAR EVERY DAY Birdie Sons, MD Taking Active   JANUMET XR [phone] MG TB24 [id number] Yes TAKE 1 TABLET EVERY DAY Birdie Sons, MD Taking Active   Multiple Vitamin (MULTIVITAMIN) tablet [id number] Yes Take 1 tablet by mouth daily. [provider] Taking Active Self  ondansetron (ZOFRAN ODT) 4 MG disintegrating tablet [id number]  Take 1 tablet (4 mg total) by mouth every 8 (eight) hours as needed for nausea or vomiting. Thersa Salt G, DO  Active   pantoprazole (PROTONIX) 40 MG tablet [id number] Yes Take 1 tablet (40 mg total) by mouth daily. Coral Spikes, DO Taking Active   sacubitril-valsartan (ENTRESTO) 24-26 MG [id number] Yes Take 0.5 tablets by mouth 2 (two) times daily. [provider] Taking Active   senna (SENOKOT) 8.6 MG tablet [id number] Yes Take 1 tablet by mouth daily as needed. [provider] Taking Active   SYNTHROID 50 MCG tablet [id number] Yes TAKE 1 TABLET EVERY DAY Birdie Sons, MD Taking Active   TRUEplus Lancets 33G MISC [id number] Yes CHECK BLOOD SUGAR ONE TIME DAILY Birdie Sons, MD Taking Active             Patient Active Problem List   Diagnosis Date Noted   Excessive daytime sleepiness 02/18/2021   Presence of automatic implantable cardioverter-defibrillator 02/18/2021   Thrombocytopenia ([place]) 06/11/2019   Pain due to onychomycosis of toenail of right foot 05/30/2019   Memory loss 07/12/2016   GERD (gastroesophageal reflux disease) 08/24/2015   Automatic implantable cardioverter-defibrillator in situ 07/29/2015   Hypotension 06/18/2015    Benign essential HTN 03/20/2015   Absolute anemia 03/11/2015   Abnormal liver enzymes 03/11/2015   Decreased potassium in the blood 03/11/2015   Hypercholesteremia 03/11/2015   Adult hypothyroidism 03/11/2015   Idiopathic insomnia 03/11/2015   OP (osteoporosis)  03/11/2015   Diabetes mellitus, type 2 ([place]) 03/11/2015   Vitamin D deficiency 03/11/2015   LBBB (left bundle branch block) 50/53/9767   Chronic systolic heart failure ([place]) 09/25/2014   Heart valve disease 04/10/2014    Immunization History  Administered Date(s) Administered   Fluad Quad(high Dose 65+) 07/22/2019, 09/14/2020   Influenza, High Dose Seasonal PF 07/07/2016, 10/09/2017, 08/01/2018   Influenza,inj,Quad PF,6+ Mos 08/14/2015   PFIZER(Purple Top)SARS-COV-2 Vaccination 12/13/2019, 01/07/2020   Pneumococcal Conjugate-13 09/18/2014   Pneumococcal Polysaccharide-23 09/11/2015    Conditions to be addressed/monitored:  Hypertension, Hyperlipidemia, Diabetes, Heart Failure, GERD, Hypothyroidism, and Osteoporosis  Care Plan : General Pharmacy (Adult)  Updates made by Germaine Pomfret, [place] since 07/02/2021 12:00 AM     Problem: Hypertension, Hyperlipidemia, Diabetes, Heart Failure, GERD, Hypothyroidism, and Osteoporosis   Priority: High     Long-Range Goal: Patient-Specific Goal   Start Date: 07/02/2021  Expected End Date: 07/02/2022  This Visit's Progress: On track  Priority: High  Note:   Current Barriers:  No barriers noted  Pharmacist Clinical Goal(s):  Patient will maintain control of diabetes as evidenced by A1c less than 8%  maintain control of heart failure as evidenced by stable weight, lack of exacerbations through collaboration with PharmD and provider.   Interventions: 1:1 collaboration with Birdie Sons, MD regarding development and update of comprehensive plan of care as evidenced by provider attestation and co-signature Inter-disciplinary care team collaboration (see longitudinal plan  of care) Comprehensive medication review performed; medication list updated in electronic medical record  Heart Failure (Goal: manage symptoms and prevent exacerbations) -Controlled -Last ejection fraction: 35% (Date: Mar 2017) -HF type: Systolic -NYHA Class: II (slight limitation of activity) -AHA HF Stage: C (Heart disease and symptoms present) -Current treatment: Carvedilol 3.125 mg twice daily  Furosemide 40 mg 1/2-1 tablet daily  Entresto 24-26 1/2 tablet twice daily  -Medications previously tried: NA  -Educated on Importance of weighing daily; if you gain more than 3 pounds in one day or 5 pounds in one week, contact cardiology office  -Recommended to continue current medication  Hyperlipidemia: (LDL goal < 100) -Controlled -Current treatment: Atorvastatin 20 mg daily  -Medications previously tried: NA  -Recommended to continue current medication  Diabetes (A1c goal <8%) -Controlled -Current medications: Farxiga 5 mg daily  Janumet [phone] mg daily  -Medications previously tried: NA  -Current home glucose readings 22-Aug 122  21-Aug 122  20-Aug 110  19-Aug 105  18-Aug 96  17-Aug 106  16-Aug 110  15-Aug 112  14-Aug 106  13-Aug 91  12-Aug 101  11-Aug 96  10-Aug 103  9-Aug 100  8-Aug 105  7-Aug 95  Average 105  -Denies hypoglycemic/hyperglycemic symptoms -Current meal patterns: Trying to minimize salt intake, carbohydrate intake  -Current exercise: Trying to get back into walking with sister -Recommended to continue current medication  Osteopenia (Goal Prevent bone fractures) -Controlled -Patient is not a candidate for pharmacologic treatment -Current treatment  None -Medications previously tried: NA  -Recommend [phone] units of vitamin D daily. -Recommended to continue current medication  Hypothyroidism (Goal: Maintain stable thyroid function) -Controlled -Current treatment  Synthroid 50 mcg daily  -Medications previously tried: NA   -Recommended to continue current medication  GERD (Goal: Prevent heartburn) -Controlled -Current treatment  Pantoprazole 40 mg daily -Medications previously tried: NA  -Recommended to continue current medication   Patient Goals/Self-Care Activities Patient will:  - check glucose daily before breakfast, document, and provide at future appointments check blood pressure weekly, document, and  provide at future appointments weigh daily, and contact provider if weight gain of greater than 2 pounds in 24 hours   Follow Up Plan: Telephone follow up appointment with care management team member scheduled for:  01/07/2022 at 8:30 AM      Medication Assistance: None required.  Patient affirms current coverage meets needs.  Compliance/Adherence/Medication fill history: Care Gaps: Tetanus Shingrix Covid Vaccine Ophthalmology  Influenza  Star-Rating Drugs: Atorvastatin 20 mg: Last filled 05/05/21 for 90-DS Janumet [phone] mg: Last filled 05/05/21 for 90-DS  Entresto 24-26 mg: Last filled 05/17/21 for 90-DS  Patient's preferred pharmacy is:  [facility] - [place], [address] [place] [address] Phone: [phone] Fax: [place] #[phone] [facility], [place] [facility] [address] AT [address] [place] [facility] [place] [address] Phone: [phone] Fax: [phone]  Uses pill box? Yes Pt endorses 100% compliance  We discussed: Current pharmacy is preferred with insurance plan and patient is satisfied with pharmacy services Patient decided to: Continue current medication management strategy  Care Plan and Follow Up Patient Decision:  Patient agrees to Care Plan and Follow-up.  Plan: Telephone follow up appointment with care management team member scheduled for:  01/07/2022 at 8:30 AM  Junius Argyle, PharmD, [place], [place] [phone]

## 2021-07-07 ENCOUNTER — Ambulatory Visit (INDEPENDENT_AMBULATORY_CARE_PROVIDER_SITE_OTHER): Payer: Medicare HMO

## 2021-07-07 DIAGNOSIS — I428 Other cardiomyopathies: Secondary | ICD-10-CM | POA: Diagnosis not present

## 2021-07-13 ENCOUNTER — Other Ambulatory Visit: Payer: Self-pay

## 2021-07-13 ENCOUNTER — Ambulatory Visit (INDEPENDENT_AMBULATORY_CARE_PROVIDER_SITE_OTHER): Payer: Medicare HMO

## 2021-07-13 ENCOUNTER — Encounter: Payer: Self-pay | Admitting: Internal Medicine

## 2021-07-13 ENCOUNTER — Telehealth: Payer: Self-pay

## 2021-07-13 ENCOUNTER — Ambulatory Visit: Payer: Medicare HMO | Admitting: Internal Medicine

## 2021-07-13 VITALS — BP 100/64 | HR 74 | Ht 61.0 in | Wt 115.0 lb

## 2021-07-13 DIAGNOSIS — I428 Other cardiomyopathies: Secondary | ICD-10-CM

## 2021-07-13 DIAGNOSIS — I5022 Chronic systolic (congestive) heart failure: Secondary | ICD-10-CM | POA: Diagnosis not present

## 2021-07-13 DIAGNOSIS — I447 Left bundle-branch block, unspecified: Secondary | ICD-10-CM

## 2021-07-13 DIAGNOSIS — Z95810 Presence of automatic (implantable) cardiac defibrillator: Secondary | ICD-10-CM

## 2021-07-13 LAB — CUP PACEART REMOTE DEVICE CHECK
Battery Remaining Longevity: 7 mo
Battery Voltage: 2.82 V
Brady Statistic AP VP Percent: 0 %
Brady Statistic AP VS Percent: 0 %
Brady Statistic AS VP Percent: 98.74 %
Brady Statistic AS VS Percent: 1.25 %
Brady Statistic RA Percent Paced: 0.01 %
Brady Statistic RV Percent Paced: 2.65 %
Date Time Interrogation Session: 20220831022823
HighPow Impedance: 48 Ohm
Implantable Lead Implant Date: 20160409
Implantable Lead Implant Date: 20160409
Implantable Lead Implant Date: 20160409
Implantable Lead Location: 753858
Implantable Lead Location: 753859
Implantable Lead Location: 753860
Implantable Lead Model: 4598
Implantable Lead Model: 5076
Implantable Pulse Generator Implant Date: 20160409
Lead Channel Impedance Value: 1007 Ohm
Lead Channel Impedance Value: 1064 Ohm
Lead Channel Impedance Value: 1121 Ohm
Lead Channel Impedance Value: 1254 Ohm
Lead Channel Impedance Value: 1349 Ohm
Lead Channel Impedance Value: 1368 Ohm
Lead Channel Impedance Value: 361 Ohm
Lead Channel Impedance Value: 399 Ohm
Lead Channel Impedance Value: 456 Ohm
Lead Channel Impedance Value: 513 Ohm
Lead Channel Impedance Value: 608 Ohm
Lead Channel Impedance Value: 760 Ohm
Lead Channel Impedance Value: 874 Ohm
Lead Channel Pacing Threshold Amplitude: 0.5 V
Lead Channel Pacing Threshold Amplitude: 0.75 V
Lead Channel Pacing Threshold Amplitude: 2.125 V
Lead Channel Pacing Threshold Pulse Width: 0.4 ms
Lead Channel Pacing Threshold Pulse Width: 0.4 ms
Lead Channel Pacing Threshold Pulse Width: 0.8 ms
Lead Channel Sensing Intrinsic Amplitude: 0.25 mV
Lead Channel Sensing Intrinsic Amplitude: 0.25 mV
Lead Channel Sensing Intrinsic Amplitude: 8.5 mV
Lead Channel Sensing Intrinsic Amplitude: 8.5 mV
Lead Channel Setting Pacing Amplitude: 1.5 V
Lead Channel Setting Pacing Amplitude: 2 V
Lead Channel Setting Pacing Amplitude: 2.5 V
Lead Channel Setting Pacing Pulse Width: 0.4 ms
Lead Channel Setting Pacing Pulse Width: 1 ms
Lead Channel Setting Sensing Sensitivity: 0.45 mV

## 2021-07-13 NOTE — Telephone Encounter (Signed)
Increased monthly battery checks to monthly. Bethany [name] is checking patients device in clinic today. Will advise patient of changes and dates.

## 2021-07-13 NOTE — Patient Instructions (Signed)
Medication Instructions:  ?- Your physician recommends that you continue on your current medications as directed. Please refer to the Current Medication list given to you today. ? ?*If you need a refill on your cardiac medications before your next appointment, please call your pharmacy* ? ? ?Lab Work: ?- none ordered ? ?If you have labs (blood work) drawn today and your tests are completely normal, you will receive your results only by: ?MyChart Message (if you have MyChart) OR ?A paper copy in the mail ?If you have any lab test that is abnormal or we need to change your treatment, we will call you to review the results. ? ? ?Testing/Procedures: ?- none ordered ? ? ?Follow-Up: ?At [facility], you and your health needs are our priority.  As part of our continuing mission to provide you with exceptional heart care, we have created designated Provider Care Teams.  These Care Teams include your primary Cardiologist (physician) and Advanced Practice Providers (APPs -  Physician Assistants and Nurse Practitioners) who all work together to provide you with the care you need, when you need it. ? ?We recommend signing up for the patient portal called "MyChart".  Sign up information is provided on this After Visit Summary.  MyChart is used to connect with patients for Virtual Visits (Telemedicine).  Patients are able to view lab/test results, encounter notes, upcoming appointments, etc.  Non-urgent messages can be sent to your provider as well.   ?To learn more about what you can do with MyChart, go to [email or web address].   ? ?Your next appointment:   ?6 month(s) ? ?The format for your next appointment:   ?In Person ? ?Provider:   ?Steven Klein, MD  ? ? ?Other Instructions ?[name] ? ?

## 2021-07-13 NOTE — Progress Notes (Signed)
ELECTROPHYSIOLOGY Clinic  NOTE  Patient ID: Bethany [name], MRN: [id number], DOB/AGE: [birth year] 75 y.o. Admit date: (Not on file) Date of Consult: 07/13/2021  Primary Physician: Birdie Sons, MD Primary Cardiologist: BK   Chief Complaint: Defibrillator   HPI Bethany [name] is a 75 y.o. female in follow-up for CRT-D implantation 4/16 for nonischemic cardiomyopathy  Relatively stable.  Mild dyspnea on exertion frequently accompanied by chest discomfort.  Nocturnal dyspnea but relieved simply by rolling over.  Some dependent edema, notes no correlation with her dyspnea.  No lightheadedness or syncope  Followed by Dr. Sharlette Dense at [facility] TEST EF   2012 Echo  20-25%   2012 LHC  Normal CAs  3/16 Echo   15-20 %   3/17 Echo  35%            Date  Cr K Hgb  5/19 .75 3.8    8/21 0.84 3.8   6/22 0.89 4.1 12.2     Past Medical History:  Diagnosis Date   AICD (automatic cardioverter/defibrillator) present    Anemia    Anxiety    CHF (congestive heart failure) (HCC)    Complication of anesthesia    Depression    Diabetes ([place])    Dilated cardiomyopathy (HCC)    Diverticulosis    Dyspnea    Dysrhythmia    Edema    FEET/ANKLES OCCAS   GERD (gastroesophageal reflux disease)    Hyperlipidemia    Hypothyroidism    LV dysfunction    Orthopnea    USES 3 PILLOWS   PONV (postoperative nausea and vomiting)    Reflux    Thrombocytopenia (HCC)    Thyroid disease    VHD (valvular heart disease)       Surgical History:  Past Surgical History:  Procedure Laterality Date   ABDOMINAL HYSTERECTOMY     BI-VENTRICULAR IMPLANTABLE CARDIOVERTER DEFIBRILLATOR [name] 02/11/2015   Procedure: BI-VENTRICULAR IMPLANTABLE CARDIOVERTER DEFIBRILLATOR  (CRT-D);  Surgeon: Deboraha Sprang, MD;  Location: [facility] CATH LAB;  Service: Cardiovascular;  Laterality: [name];   BONE MARROW BIOPSY  2007   spine   BREAST BIOPSY Right    neg   CARDIAC CATHETERIZATION     CATARACT EXTRACTION W/PHACO Right  01/03/2017   Procedure: CATARACT EXTRACTION PHACO AND INTRAOCULAR LENS PLACEMENT (IOC);  Surgeon: Leandrew Koyanagi, MD;  Location: [facility] ORS;  Service: Ophthalmology;  Laterality: Right;  [place] 00:50 AP% 19.5 CDE 9.94 fluid pack lot # [id number]   CATARACT EXTRACTION W/PHACO Left 10/27/2016   Procedure: CATARACT EXTRACTION PHACO AND INTRAOCULAR LENS PLACEMENT (IOC);  Surgeon: Leandrew Koyanagi, MD;  Location: [facility] ORS;  Service: Ophthalmology;  Laterality: Left;  [place]  01:12 AP%  21.0 CDE  11.59 Fluid pack lot # [id number]   CHOLECYSTECTOMY     INSERT / REPLACE / REMOVE PACEMAKER     AICD   LEAD REVISION [name] 02/11/2015   Procedure: LEAD REVISION;  Surgeon: Deboraha Sprang, MD;  Location[facility] CATH LAB;  Service: Cardiovascular;  Laterality: [name];   PARTIAL HYSTERECTOMY  1977   TOOTH EXTRACTION  09/23/2015     Home Meds: Current Outpatient Medications on File Prior to Visit  Medication Sig Dispense Refill   acetaminophen (TYLENOL) 500 MG tablet Take 1,000 mg by mouth 2 (two) times daily as needed for mild pain or moderate pain.     Alcohol Swabs (B-D SINGLE USE SWABS REGULAR) PADS USE TO CHECK BLOOD SUGAR DAILY 100 each 4  aspirin EC 81 MG tablet Take 81 mg by mouth daily.     atorvastatin (LIPITOR) 20 MG tablet TAKE 1 TABLET EVERY DAY 90 tablet 3   Blood Glucose Calibration (TRUE METRIX LEVEL 1) Low SOLN Use as directed with glucose meter 1 each 3   carvedilol (COREG) 3.125 MG tablet Take 1 tablet (3.125 mg total) by mouth 2 (two) times daily with a meal. 180 tablet 1   cyanocobalamin 100 MCG tablet Take 100 mcg by mouth daily.     FARXIGA 5 MG TABS tablet TAKE 1 TABLET (5 MG TOTAL) DAILY BEFORE BREAKFAST 90 tablet 0   ferrous sulfate 325 (65 FE) MG tablet Take 325 mg by mouth 2 (two) times daily with a meal.     furosemide (LASIX) 40 MG tablet Take 0.5-1 tablets (20-40 mg total) by mouth daily.     glucose blood (TRUE METRIX BLOOD GLUCOSE TEST) test strip TEST BLOOD SUGAR EVERY DAY 100 strip 3    JANUMET XR [phone] MG TB24 TAKE 1 TABLET EVERY DAY 90 tablet 4   Multiple Vitamin (MULTIVITAMIN) tablet Take 1 tablet by mouth daily.     pantoprazole (PROTONIX) 40 MG tablet Take 1 tablet (40 mg total) by mouth daily. 30 tablet 0   sacubitril-valsartan (ENTRESTO) 24-26 MG Take 0.5 tablets by mouth 2 (two) times daily.     senna (SENOKOT) 8.6 MG tablet Take 1 tablet by mouth daily as needed.     SYNTHROID 50 MCG tablet TAKE 1 TABLET EVERY DAY 90 tablet 3   TRUEplus Lancets 33G MISC CHECK BLOOD SUGAR ONE TIME DAILY 100 each 4   No current facility-administered medications on file prior to visit.      Allergies:  Allergies  Allergen Reactions   Other Other (See Comments)    NO BLOOD PRODUCTS- JEHOVAHS WITNESS   Sulfa Antibiotics Other (See Comments)    Loss of taste       ROS:  Please see the history of present illness.     All other systems reviewed and negative.   BP 100/64 (BP Location: Left Arm, Patient Position: Sitting, Cuff Size: Normal)   Pulse 74   Ht '5\' 1"'  (1.549 m)   Wt 115 lb (52.2 kg)   LMP  (LMP Unknown)   BMI 21.73 kg/m  Well developed and well nourished in no acute distress HENT normal Neck supple with JVP-flat Clear Device pocket well healed; without hematoma or erythema.  There is no tethering  Regular rate and rhythm, no murmur Abd-soft with active BS No Clubbing cyanosis  edema Skin-warm and dry A & Oriented  Grossly normal sensory and motor function  ECG P synchronous pacing at 74 Intervals 14/12/43    Assessment and Plan:   NICM  CHF systolic chronic/ class 2B   LBBB  CRT-D-Medtr[employer] implanted 4/16  Dyspnea on exertion with chest discomfort  Fatigue  She is euvolemic.  We will continue her on her current furosemide at 20 mg a day  Blood pressure is well controlled without intervening lightheadedness.  Continue carvedilol 3.125 and Entresto 24/26  Chest discomfort is concerning for angina; however, she at least 10 years ago at  the time of her diagnosis had no obstructive coronary disease.  I will reach out to Dr. Nehemiah Massed, her main cardiologist and referred to him for evaluation.  She is approaching ERI.  We will plan to see her again in 6 months  Virl Axe

## 2021-07-20 NOTE — Progress Notes (Signed)
Remote ICD transmission.   

## 2021-07-22 NOTE — Progress Notes (Signed)
Remote ICD transmission.   

## 2021-07-29 ENCOUNTER — Ambulatory Visit: Payer: Self-pay | Admitting: *Deleted

## 2021-07-29 NOTE — Telephone Encounter (Signed)
It is okay to receive the flu vaccine and the Covid vaccine at the same time, however not in the same arm. Each vaccine should be given in its own arm.  Tylenol afterwards and cold compress at the injection sites for soreness if needed.  Reason for Disposition  Health Information question, no triage required and triager able to answer question  Answer Assessment - Initial Assessment Questions 1. REASON FOR CALL or QUESTION: "What is your reason for calling today?" or "How can I best help you?" or "What question do you have that I can help answer?"     Can the flu shot and Covid Booster be taken at the same time.  Protocols used: Information Only Call - No Triage-A-AH

## 2021-08-13 ENCOUNTER — Ambulatory Visit (INDEPENDENT_AMBULATORY_CARE_PROVIDER_SITE_OTHER): Payer: Medicare HMO

## 2021-08-13 DIAGNOSIS — I447 Left bundle-branch block, unspecified: Secondary | ICD-10-CM

## 2021-08-17 LAB — CUP PACEART REMOTE DEVICE CHECK
Battery Remaining Longevity: 6 mo
Battery Voltage: 2.82 V
Brady Statistic AP VP Percent: 0 %
Brady Statistic AP VS Percent: 0 %
Brady Statistic AS VP Percent: 98.77 %
Brady Statistic AS VS Percent: 1.22 %
Brady Statistic RA Percent Paced: 0 %
Brady Statistic RV Percent Paced: 3.34 %
Date Time Interrogation Session: 20221007043823
HighPow Impedance: 50 Ohm
Implantable Lead Implant Date: 20160409
Implantable Lead Implant Date: 20160409
Implantable Lead Implant Date: 20160409
Implantable Lead Location: 753858
Implantable Lead Location: 753859
Implantable Lead Location: 753860
Implantable Lead Model: 4598
Implantable Lead Model: 5076
Implantable Pulse Generator Implant Date: 20160409
Lead Channel Impedance Value: 1007 Ohm
Lead Channel Impedance Value: 1083 Ohm
Lead Channel Impedance Value: 1140 Ohm
Lead Channel Impedance Value: 1254 Ohm
Lead Channel Impedance Value: 1311 Ohm
Lead Channel Impedance Value: 1349 Ohm
Lead Channel Impedance Value: 361 Ohm
Lead Channel Impedance Value: 399 Ohm
Lead Channel Impedance Value: 475 Ohm
Lead Channel Impedance Value: 532 Ohm
Lead Channel Impedance Value: 646 Ohm
Lead Channel Impedance Value: 722 Ohm
Lead Channel Impedance Value: 817 Ohm
Lead Channel Pacing Threshold Amplitude: 0.5 V
Lead Channel Pacing Threshold Amplitude: 0.625 V
Lead Channel Pacing Threshold Amplitude: 2.125 V
Lead Channel Pacing Threshold Pulse Width: 0.4 ms
Lead Channel Pacing Threshold Pulse Width: 0.4 ms
Lead Channel Pacing Threshold Pulse Width: 0.8 ms
Lead Channel Sensing Intrinsic Amplitude: 0.375 mV
Lead Channel Sensing Intrinsic Amplitude: 0.375 mV
Lead Channel Sensing Intrinsic Amplitude: 8.5 mV
Lead Channel Sensing Intrinsic Amplitude: 8.5 mV
Lead Channel Setting Pacing Amplitude: 1.5 V
Lead Channel Setting Pacing Amplitude: 2 V
Lead Channel Setting Pacing Amplitude: 2.5 V
Lead Channel Setting Pacing Pulse Width: 0.4 ms
Lead Channel Setting Pacing Pulse Width: 1 ms
Lead Channel Setting Sensing Sensitivity: 0.45 mV

## 2021-08-23 NOTE — Progress Notes (Signed)
Remote ICD transmission.   

## 2021-08-23 NOTE — Addendum Note (Signed)
Addended by: Geralyn Flash D on: 08/23/2021 05:06 PM   Modules accepted: Level of Service

## 2021-08-25 ENCOUNTER — Other Ambulatory Visit: Payer: Self-pay | Admitting: Family Medicine

## 2021-08-25 ENCOUNTER — Telehealth: Payer: Self-pay

## 2021-08-25 NOTE — Progress Notes (Signed)
Chronic Care Management Pharmacy Assistant   Name: CORRY STORIE  MRN: [id number] DOB: [birth year]  Reason for Encounter: Diabetes Disease State Call   Recent office visits:  None ID  Recent consult visits:  07/13/2021 [name], MD (Cardiology) for Follow-up- Discontinued: Bisacodyl 5 mg, Ondansetron 4 mg, EKG and Pacemaker external ordered, patient instructed to follow-up in 6 months  Hospital visits:  None in previous 6 months  Medications: Outpatient Encounter Medications as of 08/25/2021  Medication Sig   acetaminophen (TYLENOL) 500 MG tablet Take 1,000 mg by mouth 2 (two) times daily as needed for mild pain or moderate pain.   Alcohol Swabs (B-D SINGLE USE SWABS REGULAR) PADS USE TO CHECK BLOOD SUGAR DAILY   aspirin EC 81 MG tablet Take 81 mg by mouth daily.   atorvastatin (LIPITOR) 20 MG tablet TAKE 1 TABLET EVERY DAY   Blood Glucose Calibration (TRUE METRIX LEVEL 1) Low SOLN Use as directed with glucose meter   carvedilol (COREG) 3.125 MG tablet Take 1 tablet (3.125 mg total) by mouth 2 (two) times daily with a meal.   cyanocobalamin 100 MCG tablet Take 100 mcg by mouth daily.   FARXIGA 5 MG TABS tablet TAKE 1 TABLET (5 MG TOTAL) DAILY BEFORE BREAKFAST   ferrous sulfate 325 (65 FE) MG tablet Take 325 mg by mouth 2 (two) times daily with a meal.   furosemide (LASIX) 40 MG tablet Take 0.5-1 tablets (20-40 mg total) by mouth daily.   glucose blood (TRUE METRIX BLOOD GLUCOSE TEST) test strip TEST BLOOD SUGAR EVERY DAY   JANUMET XR [phone] MG TB24 TAKE 1 TABLET EVERY DAY   Multiple Vitamin (MULTIVITAMIN) tablet Take 1 tablet by mouth daily.   pantoprazole (PROTONIX) 40 MG tablet Take 1 tablet (40 mg total) by mouth daily.   sacubitril-valsartan (ENTRESTO) 24-26 MG Take 0.5 tablets by mouth 2 (two) times daily.   senna (SENOKOT) 8.6 MG tablet Take 1 tablet by mouth daily as needed.   SYNTHROID 50 MCG tablet TAKE 1 TABLET EVERY DAY   TRUEplus Lancets 33G MISC CHECK BLOOD  SUGAR ONE TIME DAILY   No facility-administered encounter medications on file as of 08/25/2021.   Care Gaps: Tetanus/TDAP Zoster Vaccines COVID-19 Vaccine Diabetic Eye Exam (Last completed 03/10/2020) Influenza Vaccine  Star Rating Drugs: Janumet [phone] mg last filled on 07/12/2021 for a 90-Day supply no pharmacy noted Farxiga 5 mg last filled on 07/07/2021 for a 90-Day supply no pharmacy noted Atorvastatin 20 mg last filled on 07/22/2021 for a 90-Day supply no pharmacy noted Entresto 24-26 mg last filled on 05/17/2021 for a 90-Day supply no pharmacy noted  Recent Relevant Labs: Lab Results  Component Value Date/Time   HGBA1C 6.8 (A) 05/24/2021 03:54 PM   HGBA1C 6.9 (A) 01/12/2021 11:57 AM   HGBA1C 8.2 (H) 04/04/2018 03:02 PM   HGBA1C 6.3 02/20/2014 07:57 PM   MICROALBUR negative 06/02/2020 10:36 AM   MICROALBUR negative 12/04/2018 11:50 AM    Kidney Function Lab Results  Component Value Date/Time   CREATININE 0.89 04/12/2021 11:14 AM   CREATININE 0.85 01/13/2021 08:15 AM   CREATININE 0.70 02/04/2015 02:26 PM   CREATININE 0.89 11/10/2014 11:47 AM   GFR 88.96 02/05/2015 10:34 AM   GFRNONAA 69 07/01/2020 11:26 AM   GFRNONAA >60 02/04/2015 02:26 PM   GFRAA 79 07/01/2020 11:26 AM   GFRAA >60 02/04/2015 02:26 PM    Current antihyperglycemic regimen:  Janumet XR [phone] mg 1 tablet daily Farxiga 5 mg 1 tablet  daily  What recent interventions/DTPs have been made to improve glycemic control:  None ID  Have there been any recent hospitalizations or ED visits since last visit with CPP? No  Patient denies hypoglycemic symptoms, including Pale, Sweaty, Shaky, Hungry, Nervous/irritable, and Vision changes  Patient denies hyperglycemic symptoms, including blurry vision, excessive thirst, fatigue, polyuria, and weakness  How often are you checking your blood sugar? once daily in the mornings prior to eating  What are your blood sugars ranging?  Fasting: Patient reports her  numbers run between 96-140  During the week, how often does your blood glucose drop below 70? Never Are you checking your feet daily/regularly? Patient reports her feel look fine. No signes of infections or any cuts that are not healing.   Adherence Review: Is the patient currently on a STATIN medication? Yes Is the patient currently on ACE/ARB medication? Yes Does the patient have >5 day gap between last estimated fill dates? No  Patient has scheduled telephone visit with CPP on 01/07/2022 @ 0830  Spoke with the patient, and she reports that she is doing well. Patient denies any ill symptoms at this time. Patient stated that she does eat 3 small meals daily. Patient reports that she does try to limit her sugar/carb intake, but she has been eating a little more cards lately than usual. Patient denies any elevated blood sugar levels due to this increase in carbs. Patient stated that she is taking all medications as directed with no problems. Patient stated that she has no financial issues when it comes to her getting her medications as she uses the [employer]. Patient instructed that if there comes a time that she feels like her diabetic medications are too expensive that I can help her with a patient assistance application. Patient provided with my name, and number for future communication.   Adelene Idler, CPA/CMA Clinical Pharmacist Assistant Phone: [phone]

## 2021-09-02 ENCOUNTER — Encounter: Payer: Self-pay | Admitting: Podiatry

## 2021-09-02 ENCOUNTER — Other Ambulatory Visit: Payer: Self-pay

## 2021-09-02 ENCOUNTER — Ambulatory Visit: Payer: Medicare HMO | Admitting: Podiatry

## 2021-09-02 DIAGNOSIS — B351 Tinea unguium: Secondary | ICD-10-CM

## 2021-09-02 DIAGNOSIS — E119 Type 2 diabetes mellitus without complications: Secondary | ICD-10-CM | POA: Diagnosis not present

## 2021-09-02 DIAGNOSIS — M79674 Pain in right toe(s): Secondary | ICD-10-CM | POA: Diagnosis not present

## 2021-09-02 NOTE — Progress Notes (Signed)
This patient returns to my office for at risk foot care.  This patient requires this care by a professional since this patient will be at risk due to having type 2 diabetes and thrombocytopenia..  This patient is unable to cut nails herself since the patient cannot reach her nails.These nails are painful walking and wearing shoes.  This patient presents for at risk foot care today.  General Appearance  Alert, conversant and in no acute stress.  Vascular  Dorsalis pedis  are palpable  Bilaterally. Posterior tibial pulses are absent  B/L. Capillary return is within normal limits  bilaterally. Cold feet  Bilaterally.  Absent digital hair.  Neurologic  Senn-Weinstein monofilament wire test within normal limits  bilaterally. Muscle power within normal limits bilaterally.  Nails Thick disfigured discolored nails with subungual debris with incurvated nails both borders right hallux.. No evidence of bacterial infection or drainage bilaterally.  Orthopedic  No limitations of motion  feet .  No crepitus or effusions noted.  No bony pathology or digital deformities noted.  Skin  normotropic skin with no porokeratosis noted bilaterally.  No signs of infections or ulcers noted.     Onychomycosis  Pain in right toes  Pain in left toes  Consent was obtained for treatment procedures.   Mechanical debridement of nail right hallux. performed with a nail nipper.  Filed with dremel without incident.    Return office visit   3 months                   Told patient to return for periodic foot care and evaluation due to potential at risk complications.   Brittnay Pigman DPM  

## 2021-09-08 DIAGNOSIS — E1151 Type 2 diabetes mellitus with diabetic peripheral angiopathy without gangrene: Secondary | ICD-10-CM | POA: Diagnosis not present

## 2021-09-08 DIAGNOSIS — I5022 Chronic systolic (congestive) heart failure: Secondary | ICD-10-CM | POA: Diagnosis not present

## 2021-09-08 DIAGNOSIS — E782 Mixed hyperlipidemia: Secondary | ICD-10-CM | POA: Diagnosis not present

## 2021-09-08 DIAGNOSIS — I447 Left bundle-branch block, unspecified: Secondary | ICD-10-CM | POA: Diagnosis not present

## 2021-09-08 DIAGNOSIS — I10 Essential (primary) hypertension: Secondary | ICD-10-CM | POA: Diagnosis not present

## 2021-09-13 ENCOUNTER — Ambulatory Visit (INDEPENDENT_AMBULATORY_CARE_PROVIDER_SITE_OTHER): Payer: Medicare HMO

## 2021-09-13 DIAGNOSIS — I428 Other cardiomyopathies: Secondary | ICD-10-CM

## 2021-09-13 LAB — CUP PACEART REMOTE DEVICE CHECK
Battery Remaining Longevity: 5 mo
Battery Voltage: 2.81 V
Brady Statistic AP VP Percent: 0 %
Brady Statistic AP VS Percent: 0 %
Brady Statistic AS VP Percent: 98.76 %
Brady Statistic AS VS Percent: 1.23 %
Brady Statistic RA Percent Paced: 0.01 %
Brady Statistic RV Percent Paced: 3.3 %
Date Time Interrogation Session: 20221107033323
HighPow Impedance: 52 Ohm
Implantable Lead Implant Date: 20160409
Implantable Lead Implant Date: 20160409
Implantable Lead Implant Date: 20160409
Implantable Lead Location: 753858
Implantable Lead Location: 753859
Implantable Lead Location: 753860
Implantable Lead Model: 4598
Implantable Lead Model: 5076
Implantable Pulse Generator Implant Date: 20160409
Lead Channel Impedance Value: 1007 Ohm
Lead Channel Impedance Value: 1064 Ohm
Lead Channel Impedance Value: 1121 Ohm
Lead Channel Impedance Value: 1235 Ohm
Lead Channel Impedance Value: 1311 Ohm
Lead Channel Impedance Value: 1368 Ohm
Lead Channel Impedance Value: 361 Ohm
Lead Channel Impedance Value: 399 Ohm
Lead Channel Impedance Value: 475 Ohm
Lead Channel Impedance Value: 513 Ohm
Lead Channel Impedance Value: 608 Ohm
Lead Channel Impedance Value: 722 Ohm
Lead Channel Impedance Value: 817 Ohm
Lead Channel Pacing Threshold Amplitude: 0.375 V
Lead Channel Pacing Threshold Amplitude: 0.625 V
Lead Channel Pacing Threshold Amplitude: 2.125 V
Lead Channel Pacing Threshold Pulse Width: 0.4 ms
Lead Channel Pacing Threshold Pulse Width: 0.4 ms
Lead Channel Pacing Threshold Pulse Width: 0.8 ms
Lead Channel Sensing Intrinsic Amplitude: 0.5 mV
Lead Channel Sensing Intrinsic Amplitude: 0.5 mV
Lead Channel Sensing Intrinsic Amplitude: 10.375 mV
Lead Channel Sensing Intrinsic Amplitude: 10.375 mV
Lead Channel Setting Pacing Amplitude: 1.5 V
Lead Channel Setting Pacing Amplitude: 2 V
Lead Channel Setting Pacing Amplitude: 2.5 V
Lead Channel Setting Pacing Pulse Width: 0.4 ms
Lead Channel Setting Pacing Pulse Width: 1 ms
Lead Channel Setting Sensing Sensitivity: 0.45 mV

## 2021-09-21 NOTE — Progress Notes (Signed)
Remote ICD transmission.   

## 2021-09-24 ENCOUNTER — Other Ambulatory Visit: Payer: Self-pay

## 2021-09-24 ENCOUNTER — Encounter: Payer: Self-pay | Admitting: Family Medicine

## 2021-09-24 ENCOUNTER — Ambulatory Visit (INDEPENDENT_AMBULATORY_CARE_PROVIDER_SITE_OTHER): Payer: Medicare HMO | Admitting: Family Medicine

## 2021-09-24 VITALS — BP 94/65 | HR 85 | Temp 98.4°F | Resp 18 | Wt 117.0 lb

## 2021-09-24 DIAGNOSIS — E039 Hypothyroidism, unspecified: Secondary | ICD-10-CM

## 2021-09-24 DIAGNOSIS — R5383 Other fatigue: Secondary | ICD-10-CM | POA: Diagnosis not present

## 2021-09-24 DIAGNOSIS — E1169 Type 2 diabetes mellitus with other specified complication: Secondary | ICD-10-CM | POA: Diagnosis not present

## 2021-09-24 LAB — POCT GLYCOSYLATED HEMOGLOBIN (HGB A1C)
Est. average glucose Bld gHb Est-mCnc: 154
Hemoglobin A1C: 7 % — AB (ref 4.0–5.6)

## 2021-09-24 NOTE — Patient Instructions (Signed)
.   Please review the attached list of medications and notify my office if there are any errors.   . Please bring all of your medications to every appointment so we can make sure that our medication list is the same as yours.   . Please contact your eyecare professional to schedule a routine eye exam    

## 2021-09-24 NOTE — Progress Notes (Signed)
Established patient visit   Patient: Bethany [name]   DOB: 06/29/1946   75 y.o. Female  MRN: [id number] Visit Date: 09/24/2021  Today's healthcare provider: Lelon Huh, MD   Chief Complaint  Patient presents with   Diabetes   Subjective    HPI  Diabetes Mellitus Type II, Follow-up  Lab Results  Component Value Date   HGBA1C 7.0 (A) 09/24/2021   HGBA1C 6.8 (A) 05/24/2021   HGBA1C 6.9 (A) 01/12/2021   Wt Readings from Last 3 Encounters:  09/24/21 117 lb (53.1 kg)  07/13/21 115 lb (52.2 kg)  05/24/21 116 lb (52.6 kg)   Last seen for diabetes 4 months ago.  Management since then includes continuing same medications. She reports good compliance with treatment. She is not having side effects.  Symptoms: Yes fatigue No foot ulcerations  No appetite changes No nausea  Yes paresthesia of the feet  No polydipsia  Yes polyuria No visual disturbances   No vomiting     Home blood sugar records: fasting range: 110-115  Episodes of hypoglycemia? No    Current insulin regiment: none Most Recent Eye Exam: not UTD Current exercise: none Current diet habits: well balanced  Pertinent Labs: Lab Results  Component Value Date   CHOL 120 01/13/2021   HDL 55 01/13/2021   LDLCALC 49 01/13/2021   TRIG 79 01/13/2021   CHOLHDL 2.2 01/13/2021   Lab Results  Component Value Date   NA 140 04/12/2021   K 4.1 04/12/2021   CREATININE 0.89 04/12/2021   EGFR 68 04/12/2021   MICROALBUR negative 06/02/2020     ---------------------------------------------------------------------------------------------------   Hypothyroid, follow-up  Lab Results  Component Value Date   TSH 2.850 01/13/2021   TSH 2.920 06/02/2020   TSH 1.070 12/30/2019   FREET4 1.28 01/13/2021   FREET4 1.43 03/13/2019   T4TOTAL 10.1 05/12/2017   Wt Readings from Last 3 Encounters:  09/24/21 117 lb (53.1 kg)  07/13/21 115 lb (52.2 kg)  05/24/21 116 lb (52.6 kg)    She was last seen for  hypothyroid 8 months ago.  Management since that visit includes continue same medication. She reports good compliance with treatment. She is not having side effects.   Symptoms: Yes change in energy level Yes constipation  No diarrhea Yes heat / cold intolerance  No nervousness No palpitations  No weight changes    -----------------------------------------------------------------------------------------     Medications: Outpatient Medications Prior to Visit  Medication Sig   acetaminophen (TYLENOL) 500 MG tablet Take 1,000 mg by mouth 2 (two) times daily as needed for mild pain or moderate pain.   Alcohol Swabs (B-D SINGLE USE SWABS REGULAR) PADS USE TO CHECK BLOOD SUGAR DAILY   aspirin EC 81 MG tablet Take 81 mg by mouth daily.   atorvastatin (LIPITOR) 20 MG tablet TAKE 1 TABLET EVERY DAY   Blood Glucose Calibration (TRUE METRIX LEVEL 1) Low SOLN Use as directed with glucose meter   carvedilol (COREG) 3.125 MG tablet Take 1 tablet (3.125 mg total) by mouth 2 (two) times daily with a meal.   cyanocobalamin 100 MCG tablet Take 100 mcg by mouth daily.   FARXIGA 5 MG TABS tablet TAKE 1 TABLET (5 MG TOTAL) DAILY BEFORE BREAKFAST   ferrous sulfate 325 (65 FE) MG tablet Take 325 mg by mouth 2 (two) times daily with a meal.   furosemide (LASIX) 40 MG tablet Take 0.5-1 tablets (20-40 mg total) by mouth daily.   glucose blood (TRUE METRIX  BLOOD GLUCOSE TEST) test strip TEST BLOOD SUGAR EVERY DAY   JANUMET XR [phone] MG TB24 TAKE 1 TABLET EVERY DAY   Multiple Vitamin (MULTIVITAMIN) tablet Take 1 tablet by mouth daily.   pantoprazole (PROTONIX) 40 MG tablet TAKE 1 TABLET (40 MG TOTAL) BY MOUTH DAILY.   sacubitril-valsartan (ENTRESTO) 24-26 MG Take 0.5 tablets by mouth 2 (two) times daily.   senna (SENOKOT) 8.6 MG tablet Take 1 tablet by mouth daily as needed.   SYNTHROID 50 MCG tablet TAKE 1 TABLET EVERY DAY   TRUEplus Lancets 33G MISC CHECK BLOOD SUGAR ONE TIME DAILY   No  facility-administered medications prior to visit.    Review of Systems  Constitutional:  Positive for fatigue. Negative for appetite change, chills and fever.  Respiratory:  Negative for chest tightness and shortness of breath.   Cardiovascular:  Negative for chest pain and palpitations.  Gastrointestinal:  Negative for abdominal pain, nausea and vomiting.  Neurological:  Negative for dizziness and weakness.      Objective    BP 94/65 (BP Location: Left Arm, Patient Position: Sitting, Cuff Size: Normal)   Pulse 85   Temp 98.4 F (36.9 C) (Oral)   Resp 18   Wt 117 lb (53.1 kg)   LMP  (LMP Unknown)   SpO2 100% Comment: room air  BMI 22.11 kg/m  {Show previous vital signs (optional):23777}  Physical Exam   General: Appearance:    Well developed, well nourished female in no acute distress  Eyes:    PERRL, conjunctiva/corneas clear, EOM's intact       Lungs:     Clear to auscultation bilaterally, respirations unlabored  Heart:    Normal heart rate. Normal rhythm.  2/6   MS:   All extremities are intact.    Neurologic:   Awake, alert, oriented x 3. No apparent focal neurological defect.         Results for orders placed or performed in visit on 09/24/21  POCT HgB A1C  Result Value Ref Range   Hemoglobin A1C 7.0 (A) 4.0 - 5.6 %   Est. average glucose Bld gHb Est-mCnc 154     Assessment & Plan     1. Type 2 diabetes mellitus with other specified complication, without long-term current use of insulin ([place]) Fairly well controlled. Continue current medications.    2. Other fatigue  - CBC - Comprehensive metabolic panel  3. Adult hypothyroidism  - TSH - T4, free   Future Appointments  Date Time Provider [place]  01/26/2022  9:40 AM Caryn Section, Kirstie Peri, MD [facility]         The entirety of the information documented in the History of Present Illness, Review of Systems and Physical Exam were personally obtained by me. Portions of this information were  initially documented by the CMA and reviewed by me for thoroughness and accuracy.     Lelon Huh, MD  [facility] [phone] (phone) [phone] (fax)  [place]

## 2021-09-27 DIAGNOSIS — E1169 Type 2 diabetes mellitus with other specified complication: Secondary | ICD-10-CM | POA: Diagnosis not present

## 2021-09-27 DIAGNOSIS — R5383 Other fatigue: Secondary | ICD-10-CM | POA: Diagnosis not present

## 2021-09-28 LAB — COMPREHENSIVE METABOLIC PANEL
ALT: 12 IU/L (ref 0–32)
AST: 24 IU/L (ref 0–40)
Albumin/Globulin Ratio: 1.6 (ref 1.2–2.2)
Albumin: 4.2 g/dL (ref 3.7–4.7)
Alkaline Phosphatase: 89 IU/L (ref 44–121)
BUN/Creatinine Ratio: 10 — ABNORMAL LOW (ref 12–28)
BUN: 8 mg/dL (ref 8–27)
Bilirubin Total: 0.2 mg/dL (ref 0.0–1.2)
CO2: 27 mmol/L (ref 20–29)
Calcium: 9.7 mg/dL (ref 8.7–10.3)
Chloride: 100 mmol/L (ref 96–106)
Creatinine, Ser: 0.81 mg/dL (ref 0.57–1.00)
Globulin, Total: 2.6 g/dL (ref 1.5–4.5)
Glucose: 122 mg/dL — ABNORMAL HIGH (ref 70–99)
Potassium: 4.5 mmol/L (ref 3.5–5.2)
Sodium: 142 mmol/L (ref 134–144)
Total Protein: 6.8 g/dL (ref 6.0–8.5)
eGFR: 76 mL/min/{1.73_m2} (ref >59)

## 2021-09-28 LAB — TSH: TSH: 5.14 u[IU]/mL — ABNORMAL HIGH (ref 0.450–4.500)

## 2021-09-28 LAB — CBC
Hematocrit: 35.8 % (ref 34.0–46.6)
Hemoglobin: 12.1 g/dL (ref 11.1–15.9)
MCH: 30.2 pg (ref 26.6–33.0)
MCHC: 33.8 g/dL (ref 31.5–35.7)
MCV: 89 fL (ref 79–97)
Platelets: 100 10*3/uL — CL (ref 150–450)
RBC: 4.01 x10E6/uL (ref 3.77–5.28)
RDW: 12.3 % (ref 11.7–15.4)
WBC: 3.6 10*3/uL (ref 3.4–10.8)

## 2021-09-28 LAB — T4, FREE: Free T4: 1.37 ng/dL (ref 0.82–1.77)

## 2021-10-05 ENCOUNTER — Telehealth: Payer: Self-pay

## 2021-10-05 MED ORDER — LEVOTHYROXINE SODIUM 75 MCG PO TABS: 75.0000 ug | ORAL_TABLET | Freq: Every day | ORAL | 1 refills | Status: DC

## 2021-10-05 NOTE — Telephone Encounter (Signed)
-----   Message from [place], [place] sent at 09/29/2021  3:07 PM EST -----  ----- Message ----- From: Karsten Fells, RN Sent: 09/29/2021   3:03 PM EST To: [facility]  Reviewed lab results and physician's note with the patient. Routing to the office for updated levothyroxine 75 mcg one tab daily.

## 2021-10-05 NOTE — Telephone Encounter (Signed)
RX sent to pt's mail order pharmacy.   Thanks,   -Vernona Rieger

## 2021-10-14 ENCOUNTER — Ambulatory Visit (INDEPENDENT_AMBULATORY_CARE_PROVIDER_SITE_OTHER): Payer: Medicare HMO

## 2021-10-14 DIAGNOSIS — I447 Left bundle-branch block, unspecified: Secondary | ICD-10-CM

## 2021-10-14 LAB — CUP PACEART REMOTE DEVICE CHECK
Battery Remaining Longevity: 4 mo
Battery Voltage: 2.79 V
Brady Statistic AP VP Percent: 0.01 %
Brady Statistic AP VS Percent: 0 %
Brady Statistic AS VP Percent: 98.7 %
Brady Statistic AS VS Percent: 1.29 %
Brady Statistic RA Percent Paced: 0.01 %
Brady Statistic RV Percent Paced: 0.92 %
Date Time Interrogation Session: 20221208033623
HighPow Impedance: 50 Ohm
Implantable Lead Implant Date: 20160409
Implantable Lead Implant Date: 20160409
Implantable Lead Implant Date: 20160409
Implantable Lead Location: 753858
Implantable Lead Location: 753859
Implantable Lead Location: 753860
Implantable Lead Model: 4598
Implantable Lead Model: 5076
Implantable Pulse Generator Implant Date: 20160409
Lead Channel Impedance Value: 1026 Ohm
Lead Channel Impedance Value: 1026 Ohm
Lead Channel Impedance Value: 1121 Ohm
Lead Channel Impedance Value: 1235 Ohm
Lead Channel Impedance Value: 1292 Ohm
Lead Channel Impedance Value: 1349 Ohm
Lead Channel Impedance Value: 361 Ohm
Lead Channel Impedance Value: 399 Ohm
Lead Channel Impedance Value: 456 Ohm
Lead Channel Impedance Value: 513 Ohm
Lead Channel Impedance Value: 646 Ohm
Lead Channel Impedance Value: 703 Ohm
Lead Channel Impedance Value: 817 Ohm
Lead Channel Pacing Threshold Amplitude: 0.5 V
Lead Channel Pacing Threshold Amplitude: 0.75 V
Lead Channel Pacing Threshold Amplitude: 2.125 V
Lead Channel Pacing Threshold Pulse Width: 0.4 ms
Lead Channel Pacing Threshold Pulse Width: 0.4 ms
Lead Channel Pacing Threshold Pulse Width: 0.8 ms
Lead Channel Sensing Intrinsic Amplitude: 0.25 mV
Lead Channel Sensing Intrinsic Amplitude: 0.25 mV
Lead Channel Sensing Intrinsic Amplitude: 10.125 mV
Lead Channel Sensing Intrinsic Amplitude: 10.125 mV
Lead Channel Setting Pacing Amplitude: 1.5 V
Lead Channel Setting Pacing Amplitude: 2 V
Lead Channel Setting Pacing Amplitude: 2.5 V
Lead Channel Setting Pacing Pulse Width: 0.4 ms
Lead Channel Setting Pacing Pulse Width: 1 ms
Lead Channel Setting Sensing Sensitivity: 0.45 mV

## 2021-10-22 NOTE — Progress Notes (Signed)
Remote ICD transmission.   

## 2021-10-26 ENCOUNTER — Ambulatory Visit: Payer: Self-pay | Admitting: *Deleted

## 2021-10-26 NOTE — Telephone Encounter (Signed)
I returned her call.     I've been around my sister who had covid.  My covid home home test is negative.  I'm not having any symptoms.  Should I be around other people?  I let her know to continue monitoring for symptoms and as long as she did not have any and her covid test is negative she is ok to be around others.    She thanked me very much for answering her question.

## 2021-11-15 ENCOUNTER — Ambulatory Visit (INDEPENDENT_AMBULATORY_CARE_PROVIDER_SITE_OTHER): Payer: Medicare HMO

## 2021-11-15 DIAGNOSIS — I428 Other cardiomyopathies: Secondary | ICD-10-CM

## 2021-11-15 LAB — CUP PACEART REMOTE DEVICE CHECK
Battery Remaining Longevity: 3 mo
Battery Voltage: 2.78 V
Brady Statistic AP VP Percent: 0 %
Brady Statistic AP VS Percent: 0 %
Brady Statistic AS VP Percent: 98.76 %
Brady Statistic AS VS Percent: 1.23 %
Brady Statistic RA Percent Paced: 0.01 %
Brady Statistic RV Percent Paced: 4.91 %
Date Time Interrogation Session: 20230109033425
HighPow Impedance: 49 Ohm
Implantable Lead Implant Date: 20160409
Implantable Lead Implant Date: 20160409
Implantable Lead Implant Date: 20160409
Implantable Lead Location: 753858
Implantable Lead Location: 753859
Implantable Lead Location: 753860
Implantable Lead Model: 4598
Implantable Lead Model: 5076
Implantable Pulse Generator Implant Date: 20160409
Lead Channel Impedance Value: 1007 Ohm
Lead Channel Impedance Value: 1026 Ohm
Lead Channel Impedance Value: 1083 Ohm
Lead Channel Impedance Value: 1197 Ohm
Lead Channel Impedance Value: 1292 Ohm
Lead Channel Impedance Value: 1349 Ohm
Lead Channel Impedance Value: 361 Ohm
Lead Channel Impedance Value: 399 Ohm
Lead Channel Impedance Value: 475 Ohm
Lead Channel Impedance Value: 475 Ohm
Lead Channel Impedance Value: 608 Ohm
Lead Channel Impedance Value: 703 Ohm
Lead Channel Impedance Value: 817 Ohm
Lead Channel Pacing Threshold Amplitude: 0.375 V
Lead Channel Pacing Threshold Amplitude: 0.75 V
Lead Channel Pacing Threshold Amplitude: 2.125 V
Lead Channel Pacing Threshold Pulse Width: 0.4 ms
Lead Channel Pacing Threshold Pulse Width: 0.4 ms
Lead Channel Pacing Threshold Pulse Width: 0.8 ms
Lead Channel Sensing Intrinsic Amplitude: 0.625 mV
Lead Channel Sensing Intrinsic Amplitude: 0.625 mV
Lead Channel Sensing Intrinsic Amplitude: 8.375 mV
Lead Channel Sensing Intrinsic Amplitude: 8.375 mV
Lead Channel Setting Pacing Amplitude: 1.5 V
Lead Channel Setting Pacing Amplitude: 2 V
Lead Channel Setting Pacing Amplitude: 2.5 V
Lead Channel Setting Pacing Pulse Width: 0.4 ms
Lead Channel Setting Pacing Pulse Width: 1 ms
Lead Channel Setting Sensing Sensitivity: 0.45 mV

## 2021-11-24 NOTE — Progress Notes (Signed)
Remote ICD transmission.   

## 2021-11-26 ENCOUNTER — Emergency Department: Payer: Medicare HMO

## 2021-11-26 ENCOUNTER — Emergency Department
Admission: EM | Admit: 2021-11-26 | Discharge: 2021-11-26 | Disposition: A | Discharge status: Home / Self Care | Payer: Medicare HMO | Attending: Emergency Medicine | Admitting: Emergency Medicine

## 2021-11-26 ENCOUNTER — Other Ambulatory Visit: Payer: Self-pay

## 2021-11-26 ENCOUNTER — Ambulatory Visit: Payer: Self-pay | Admitting: *Deleted

## 2021-11-26 DIAGNOSIS — E119 Type 2 diabetes mellitus without complications: Secondary | ICD-10-CM | POA: Insufficient documentation

## 2021-11-26 DIAGNOSIS — X501XXA Overexertion from prolonged static or awkward postures, initial encounter: Secondary | ICD-10-CM | POA: Insufficient documentation

## 2021-11-26 DIAGNOSIS — S3992XA Unspecified injury of lower back, initial encounter: Secondary | ICD-10-CM | POA: Diagnosis present

## 2021-11-26 DIAGNOSIS — S39012A Strain of muscle, fascia and tendon of lower back, initial encounter: Secondary | ICD-10-CM | POA: Diagnosis not present

## 2021-11-26 DIAGNOSIS — M47816 Spondylosis without myelopathy or radiculopathy, lumbar region: Secondary | ICD-10-CM | POA: Diagnosis not present

## 2021-11-26 MED ORDER — CYCLOBENZAPRINE HCL 10 MG PO TABS
5.0000 mg | ORAL_TABLET | Freq: Once | ORAL | Status: AC
  Administered 2021-11-26: 5 mg via ORAL
  Filled 2021-11-26: qty 1

## 2021-11-26 MED ORDER — LIDOCAINE 5 % EX PTCH: 1.0000 | MEDICATED_PATCH | Freq: Two times a day (BID) | CUTANEOUS | 0 refills | Status: AC | PRN

## 2021-11-26 MED ORDER — LIDOCAINE 5 % EX PTCH
1.0000 | MEDICATED_PATCH | Freq: Once | CUTANEOUS | Status: DC
  Administered 2021-11-26: 1 via TRANSDERMAL
  Filled 2021-11-26: qty 1

## 2021-11-26 MED ORDER — CYCLOBENZAPRINE HCL 5 MG PO TABS: 5.0000 mg | ORAL_TABLET | Freq: Three times a day (TID) | ORAL | 0 refills | Status: DC | PRN

## 2021-11-26 NOTE — ED Triage Notes (Signed)
Pt states she was helping change her sister and felt something pop in her lower back and is having pain since, pt is ambulatory with a steady gait.

## 2021-11-26 NOTE — ED Provider Notes (Signed)
[facility] Provider Note  Patient Contact: 12:14 PM (approximate)   History   Back Pain   HPI  Bethany [name] is a 76 y.o. female with a history of osteopenia, diabetes, CHD, and thyroid disease, presents to the ED for evaluation of acute low back pain.  Patient reports mechanical injury on yesterday, when she was attempting to rollover her bedbound sister for daily care.  She describes feeling something pop in her lower back, and endorses acute pain to the right-sided lumbar sacral junction since that time.  She denies any bladder or bowel incontinence, foot drop, or saddle anesthesia.  She took some Tylenol yesterday with limited benefit.  She presents today for evaluation of ongoing low back pain.  Physical Exam   Triage Vital Signs: ED Triage Vitals [11/26/21 1045]  Enc Vitals Group     BP 97/64     Pulse Rate 80     Resp 16     Temp 97.7 F (36.5 C)     Temp Source Oral     SpO2 98 %     Weight      Height      Head Circumference      Peak Flow      Pain Score      Pain Loc      Pain Edu?      Excl. in [place]?     Most recent vital signs: Vitals:   11/26/21 1045 11/26/21 1411  BP: 97/64 106/70  Pulse: 80 65  Resp: 16 18  Temp: 97.7 F (36.5 C) (!) 97.5 F (36.4 C)  SpO2: 98% 98%     General: Alert and in no acute distress. Cardiovascular:  Good peripheral perfusion Respiratory: Normal respiratory effort without tachypnea or retractions. Lungs CTAB.  Gastrointestinal: Bowel sounds 4 quadrants. Soft and nontender to palpation. No guarding or rigidity. No palpable masses. No distention. No CVA tenderness. Musculoskeletal: Full range of motion to all extremities.  Normal spinal alignment without midline tenderness, spasm, vomiting, or step-off.  Patient mother tender to palpation to the right-sided lumbar sacral junction. Neurologic:  No gross focal neurologic deficits are appreciated.  Skin:   No rash noted Other:   ED Results /  Procedures / Treatments   Labs (all labs ordered are listed, but only abnormal results are displayed) Labs Reviewed - No data to display   EKG   RADIOLOGY  I personally viewed and evaluated these images as part of my medical decision making, as well as reviewing the written report by the radiologist.  ED Provider Interpretation: agree with report. No acute findings  DG Lumbar Spine Complete  Result Date: 11/26/2021 CLINICAL DATA:  Provided history: Pain status post mechanical injury. Additional history provided: Patient reports right-sided back pain following injury while bathing sister. EXAM: LUMBAR SPINE - COMPLETE 4+ VIEW COMPARISON:  Lumbar spine radiographs 01/25/2017. FINDINGS: There are 6 non-rib-bearing lumbar type vertebral bodies. For the purposes of this dictation, the lowest well-formed intervertebral disc space is designated L5-S1 and ribs are presumed absent at the T12 level. L5-S1 grade 1 anterolisthesis. Vertebral body height is maintained. No radiographic evidence of acute fracture to the lumbar spine. Mild-to-moderate disc degeneration at L5-S1. The intervertebral disc spaces are otherwise maintained. Multilevel facet arthrosis, greatest at L5-S1. Surgical clips within the right upper quadrant of the abdomen. IMPRESSION: There are 6 non-rib-bearing lumbar type vertebral bodies. For the purposes of this dictation, the lowest well-formed intervertebral disc space is designated L5-S1,  and ribs are presumed absent at the T12 level. No radiographic evidence of acute fracture to the lumbar spine. L5-S1 grade 1 anterolisthesis. Lumbar spondylosis, greatest at L5-S1. Electronically Signed   By: Kellie Simmering D.O.   On: 11/26/2021 13:04    PROCEDURES:  Critical Care performed: No  Procedures   MEDICATIONS ORDERED IN ED: Medications  lidocaine (LIDODERM) 5 % 1 patch (1 patch Transdermal Patch Applied 11/26/21 1358)  cyclobenzaprine (FLEXERIL) tablet 5 mg (5 mg Oral Given 11/26/21  1358)     IMPRESSION / MDM / ASSESSMENT AND PLAN / ED COURSE  I reviewed the triage vital signs and the nursing notes.                              Differential diagnosis includes, but is not limited to, lumbar strain, lumbar radiculopathy, compression fracture  Geriatric patient with ED evaluation of acute low back pain after mechanical injury.  She is presenting in no acute distress at this time.  No red flags on exam.  No acute or muscle deficit is appreciated.  X-ray images of the lumbar spine reviewed by me and negative for any acute findings including fracture or dislocation.  Patient clinical picture is most consistent with lumbar strain.  Patient will be discharged home with prescriptions for cyclobenzaprine and Lidoderm patches. Patient is to follow up with the PCP as needed or otherwise directed. Patient is given ED precautions to return to the ED for any worsening or new symptoms.   FINAL CLINICAL IMPRESSION(S) / ED DIAGNOSES   Final diagnoses:  Strain of lumbar region, initial encounter     Rx / DC Orders   ED Discharge Orders          Ordered    lidocaine (LIDODERM) 5 %  Every 12 hours PRN        11/26/21 1339    cyclobenzaprine (FLEXERIL) 5 MG tablet  3 times daily PRN        11/26/21 1339             Note:  This document was prepared using Dragon voice recognition software and may include unintentional dictation errors.    Melvenia Needles, PA-C 11/26/21 1524    Nena Polio, MD 11/26/21 1616

## 2021-11-26 NOTE — Discharge Instructions (Addendum)
Your exam and x-rays are negative for any acute fracture at this time.  Take OTC Tylenol or Motrin as needed for pain.  May take the prescription muscle relaxant as needed for muscle spasm.  With the Lidoderm patches every 12 hours as needed for additional pain relief.

## 2021-11-26 NOTE — Telephone Encounter (Signed)
°  Chief Complaint: back pain Symptoms: severe back pain- lower R Frequency: pain with movement Pertinent Negatives: Patient denies fever, weakness, numbness Disposition: [x] ED /[] Urgent Care (no appt availability in office) / [] Appointment(In office/virtual)/ []  [place] [facility]/ [] Home Care/ [] Refused Recommended Disposition /[] [place] [facility]/ []  Follow-up with PCP Additional Notes:

## 2021-11-26 NOTE — Telephone Encounter (Signed)
Reason for Disposition  [1] SEVERE back pain (e.g., excruciating) AND [2] sudden onset AND [3] age > 60 years  Answer Assessment - Initial Assessment Questions 1. ONSET: "When did the pain begin?"      Yesterday evening 2. LOCATION: "Where does it hurt?" (upper, mid or lower back)     Lower back- R side 3. SEVERITY: "How bad is the pain?"  (e.g., Scale 1-10; mild, moderate, or severe)   - MILD (1-3): doesn't interfere with normal activities    - MODERATE (4-7): interferes with normal activities or awakens from sleep    - SEVERE (8-10): excruciating pain, unable to do any normal activities      8-9 4. PATTERN: "Is the pain constant?" (e.g., yes, no; constant, intermittent)      Pain with movement 5. RADIATION: "Does the pain shoot into your legs or elsewhere?"     *No Answer* 6. CAUSE:  "What do you think is causing the back pain?"      Lifting injury 7. BACK OVERUSE:  "Any recent lifting of heavy objects, strenuous work or exercise?"     Trying to turn sister in bed- as rolling heard pop 8. MEDICATIONS: "What have you taken so far for the pain?" (e.g., nothing, acetaminophen, NSAIDS)     Tylenol 9. NEUROLOGIC SYMPTOMS: "Do you have any weakness, numbness, or problems with bowel/bladder control?"     no 10. OTHER SYMPTOMS: "Do you have any other symptoms?" (e.g., fever, abdominal pain, burning with urination, blood in urine)       nausea 11. PREGNANCY: "Is there any chance you are pregnant?" (e.g., yes, no; LMP)       *No Answer*  Protocols used: Back Pain-A-AH

## 2021-11-26 NOTE — ED Notes (Signed)
Reports onset after helping care for family while giving bed bath. "Felt a pop", Denies injury, or other sx. "Feel better".

## 2021-11-26 NOTE — ED Notes (Signed)
PA at bedside examining pt.  Pt complains of lower back pain on R side that wraps around to front that started yesterday. Pain aggravated when moves L leg. Denies recent falls, no ambulatory aids used.  Hx osteoporisis and OA. Pt states she has ICD/pacemaker.

## 2021-11-29 ENCOUNTER — Other Ambulatory Visit: Payer: Self-pay

## 2021-11-29 ENCOUNTER — Ambulatory Visit (INDEPENDENT_AMBULATORY_CARE_PROVIDER_SITE_OTHER): Payer: Medicare HMO | Admitting: Family Medicine

## 2021-11-29 ENCOUNTER — Ambulatory Visit: Payer: Self-pay

## 2021-11-29 ENCOUNTER — Other Ambulatory Visit: Payer: Self-pay | Admitting: Family Medicine

## 2021-11-29 ENCOUNTER — Encounter: Payer: Self-pay | Admitting: Family Medicine

## 2021-11-29 VITALS — BP 95/64 | HR 73 | Resp 16 | Wt 112.3 lb

## 2021-11-29 DIAGNOSIS — Z09 Encounter for follow-up examination after completed treatment for conditions other than malignant neoplasm: Secondary | ICD-10-CM | POA: Diagnosis not present

## 2021-11-29 DIAGNOSIS — I5022 Chronic systolic (congestive) heart failure: Secondary | ICD-10-CM

## 2021-11-29 DIAGNOSIS — M545 Low back pain, unspecified: Secondary | ICD-10-CM | POA: Diagnosis not present

## 2021-11-29 DIAGNOSIS — R5383 Other fatigue: Secondary | ICD-10-CM

## 2021-11-29 DIAGNOSIS — I959 Hypotension, unspecified: Secondary | ICD-10-CM | POA: Diagnosis not present

## 2021-11-29 MED ORDER — MELOXICAM 7.5 MG PO TABS: 7.5000 mg | ORAL_TABLET | Freq: Every day | ORAL | 0 refills | Status: DC

## 2021-11-29 NOTE — Assessment & Plan Note (Signed)
Seen at [facility] s/p pop in back helping sister with ADLs

## 2021-11-29 NOTE — Progress Notes (Signed)
Established patient visit   Patient: Bethany [name]   DOB: 1946/02/25   76 y.o. Female  MRN: [id number] Visit Date: 11/29/2021  Today's healthcare provider: Gwyneth Sprout, FNP   Chief Complaint  Patient presents with   Back Pain   Subjective    HPI  Follow up Hospitalization  Patient was admitted to [facility] on 11/26/21 and discharged on 11/26/21. She was treated for strain of lumbar region. Treatment for this included prescribing patient Lidocaine Patches and Flexeril PRN. Telephone follow up was done on 11/29/21 triage note She reports satisfactory compliance with treatment.Patient states unable to pick up lidocaine patches awaiting doctor approval She reports this condition is stayed the same.  ----------------------------------------------------------------------------------------- -   Medications: Outpatient Medications Prior to Visit  Medication Sig   acetaminophen (TYLENOL) 500 MG tablet Take 1,000 mg by mouth 2 (two) times daily as needed for mild pain or moderate pain.   Alcohol Swabs (B-D SINGLE USE SWABS REGULAR) PADS USE TO CHECK BLOOD SUGAR DAILY   aspirin EC 81 MG tablet Take 81 mg by mouth daily.   atorvastatin (LIPITOR) 20 MG tablet TAKE 1 TABLET EVERY DAY   Blood Glucose Calibration (TRUE METRIX LEVEL 1) Low SOLN Use as directed with glucose meter   carvedilol (COREG) 3.125 MG tablet TAKE 1 TABLET TWICE DAILY WITH MEALS   cyanocobalamin 100 MCG tablet Take 100 mcg by mouth daily.   cyclobenzaprine (FLEXERIL) 5 MG tablet Take 1 tablet (5 mg total) by mouth 3 (three) times daily as needed.   FARXIGA 5 MG TABS tablet TAKE 1 TABLET (5 MG TOTAL) DAILY BEFORE BREAKFAST   ferrous sulfate 325 (65 FE) MG tablet Take 325 mg by mouth 2 (two) times daily with a meal.   furosemide (LASIX) 40 MG tablet Take 0.5-1 tablets (20-40 mg total) by mouth daily.   glucose blood (TRUE METRIX BLOOD GLUCOSE TEST) test strip TEST BLOOD SUGAR EVERY DAY   JANUMET XR [phone] MG TB24 TAKE  1 TABLET EVERY DAY   levothyroxine (SYNTHROID) 75 MCG tablet Take 1 tablet (75 mcg total) by mouth daily.   lidocaine (LIDODERM) 5 % Place 1 patch onto the skin every 12 (twelve) hours as needed for up to 10 days. Remove & Discard patch after 12 hours of wear each day.   Multiple Vitamin (MULTIVITAMIN) tablet Take 1 tablet by mouth daily.   pantoprazole (PROTONIX) 40 MG tablet TAKE 1 TABLET (40 MG TOTAL) BY MOUTH DAILY.   sacubitril-valsartan (ENTRESTO) 24-26 MG Take 0.5 tablets by mouth 2 (two) times daily.   senna (SENOKOT) 8.6 MG tablet Take 1 tablet by mouth daily as needed.   TRUEplus Lancets 33G MISC CHECK BLOOD SUGAR ONE TIME DAILY   No facility-administered medications prior to visit.    Review of Systems     Objective    BP 95/64    Pulse 73    Resp 16    Wt 112 lb 4.8 oz (50.9 kg)    LMP  (LMP Unknown)    SpO2 100%    BMI 21.22 kg/m    Physical Exam Vitals and nursing note reviewed.  Constitutional:      General: She is not in acute distress.    Appearance: Normal appearance. She is normal weight. She is not ill-appearing, toxic-appearing or diaphoretic.  HENT:     Head: Normocephalic and atraumatic.  Cardiovascular:     Rate and Rhythm: Normal rate and regular rhythm.     Pulses:  Normal pulses.     Heart sounds: Normal heart sounds. No murmur heard.   No friction rub. No gallop.  Pulmonary:     Effort: Pulmonary effort is normal. No respiratory distress.     Breath sounds: Normal breath sounds. No stridor. No wheezing, rhonchi or rales.  Chest:     Chest wall: No tenderness.  Abdominal:     General: Bowel sounds are normal.     Palpations: Abdomen is soft.  Musculoskeletal:        General: Tenderness and signs of injury present. No swelling or deformity. Normal range of motion.     Lumbar back: Signs of trauma and tenderness present.     Right lower leg: No edema.     Left lower leg: No edema.     Comments: S/p injury helping her sister with ADLs; see  inpatient [facility] note  Skin:    General: Skin is warm and dry.     Capillary Refill: Capillary refill takes less than 2 seconds.     Coloration: Skin is not jaundiced or pale.     Findings: No bruising, erythema, lesion or rash.  Neurological:     General: No focal deficit present.     Mental Status: She is alert and oriented to person, place, and time. Mental status is at baseline.     Cranial Nerves: No cranial nerve deficit.     Sensory: No sensory deficit.     Motor: No weakness.     Coordination: Coordination normal.  Psychiatric:        Mood and Affect: Mood normal.        Behavior: Behavior normal.        Thought Content: Thought content normal.        Judgment: Judgment normal.    No results found for any visits on 11/29/21.  Assessment & Plan     Problem List Items Addressed This Visit       Cardiovascular and Mediastinum   Hypotension    Chronic, stable Denies CP Denies SOB Denies DOE No LE Edema noted on exam Continue medication for BP control; coreg Refills provided Seek emergent care if you develop CP, chest pain or chest pressure         Other   Hospital discharge follow-up - Primary    Seen at [facility] s/p pop in back helping sister with ADLs      Relevant Medications   meloxicam (MOBIC) 7.5 MG tablet   Acute bilateral low back pain without sciatica    Primary R vs L -has not picked up patch -encouraged OK to use 4% patch if too expensive -OK to use flexeril at night time if too sedating; reassurance provided -trial of mobic and exercises provided -does not wish to f/u with ortho at this time      Relevant Medications   meloxicam (MOBIC) 7.5 MG tablet     Return in about 4 weeks (around 12/27/2021) for chonic disease management.      Vonna Kotyk, FNP, have reviewed all documentation for this visit. The documentation on 11/29/21 for the exam, diagnosis, procedures, and orders are all accurate and complete.  Patient seen and examined by  Tally Joe,  FNP note scribed by Jennings Books, [place], [place] [phone] (phone) [phone] (fax)  [place]

## 2021-11-29 NOTE — Assessment & Plan Note (Signed)
Primary R vs L -has not picked up patch -encouraged OK to use 4% patch if too expensive -OK to use flexeril at night time if too sedating; reassurance provided -trial of mobic and exercises provided -does not wish to f/u with ortho at this time

## 2021-11-29 NOTE — Patient Instructions (Signed)
Can get 4% or similar topical numbing patch if 5% patch is too expensive.

## 2021-11-29 NOTE — Assessment & Plan Note (Signed)
Chronic, stable Denies CP Denies SOB Denies DOE No LE Edema noted on exam Continue medication for BP control; coreg Refills provided Seek emergent care if you develop CP, chest pain or chest pressure

## 2021-11-29 NOTE — Telephone Encounter (Signed)
°  Chief Complaint: lower back pain Symptoms: moderate pain with sneezing or coughing  and soreness with movement Frequency: last Wednesday  Pertinent Negatives: Patient denies weakness, numbness or incontinence of bowel or bladder.  Disposition: [] ED /[] Urgent Care (no appt availability in office) / [] Appointment(In office/virtual)/ [x]  [facility] [place] [facility]/ [] Home Care/ [] Refused Recommended Disposition /[] [facility]/ []  Follow-up with PCP Additional Notes: has been taking flexeril and warm compresses waiting on Lidocaine patch.           Reason for Disposition  [1] MODERATE back pain (e.g., interferes with normal activities) AND [2] present > 3 days  Answer Assessment - Initial Assessment Questions 1. ONSET: "When did the pain begin?"      Last Wednesday 2. LOCATION: "Where does it hurt?" (upper, mid or lower back)     lower 3. SEVERITY: "How bad is the pain?"  (e.g., Scale 1-10; mild, moderate, or severe)   - MILD (1-3): doesn't interfere with normal activities    - MODERATE (4-7): interferes with normal activities or awakens from sleep    - SEVERE (8-10): excruciating pain, unable to do any normal activities      8 when sneezing or coughing, feels sore with movement 4. PATTERN: "Is the pain constant?" (e.g., yes, no; constant, intermittent)      Comes and goes- 5. RADIATION: "Does the pain shoot into your legs or elsewhere?"     no 6. CAUSE:  "What do you think is causing the back pain?"      Helping sister with bath  7. BACK OVERUSE:  "Any recent lifting of heavy objects, strenuous work or exercise?"     Assisted sister with bath and hurt back heard 'POP" 8. MEDICATIONS: "What have you taken so far for the pain?" (e.g., nothing, acetaminophen, NSAIDS)     Flexeril,  9. NEUROLOGIC SYMPTOMS: "Do you have any weakness, numbness, or problems with bowel/bladder control?"     Hurts when BM  10. OTHER SYMPTOMS: "Do you have any other symptoms?" (e.g., fever,  abdominal pain, burning with urination, blood in urine)       no 11. PREGNANCY: "Is there any chance you are pregnant?" (e.g., yes, no; LMP)       *No Answer*  Protocols used: Back Pain-A-AH

## 2021-12-02 NOTE — Progress Notes (Signed)
Monthly battery checks are scheduled. 

## 2021-12-03 ENCOUNTER — Telehealth: Payer: Self-pay

## 2021-12-03 NOTE — Progress Notes (Signed)
Chronic Care Management Pharmacy Assistant   Name: Bethany [name]  MRN: [id number] DOB: [birth year]  Reason for Encounter: Diabetes/CHF Disease State Call   Recent office visits:  11/29/2021 Bethany Norton, FNP (PCP Office Visit) for Back Pain- Started: Meloxicam 7.5 mg Daily, No ordes placed, patient instructed to follow-up in 4 weeks  09/24/2021 Bethany Merry, MD (PCP Office Visit) for Type II DM- No medication changes noted, lab orders placed, No follow-up noted  Recent consult visits:  09/08/2021 Arnoldo Hooker, MD (Cardiology) for Shortness of Breath, Fatigue, Swelling, Dizziness- No medication changes noted, No orders placed, patient instructed to follow-up in 6 months  09/02/2021 Helane Gunther, DPM (Podiatry) for Nail Problem- No medication changes noted, no orders placed, patient instructed to follow-up in 3 months  Hospital visits:  Medication Reconciliation was completed by comparing discharge summary, patients EMR and Pharmacy list, and upon discussion with patient.  Admitted to the hospital on 11/26/2021 due to Back Pain. Discharge date was 11/26/2021 . Discharged from [facility] Emergency Department.    New?Medications Started at [facility] Discharge:?? Started: cyclobenzaprine 5 MG tablet Take 1 tablet (5 mg total) by mouth 3 (three) times daily as needed. lidocaine 5 % Place 1 patch onto the skin every 12 (twelve) hours as needed for up to 10 days. Remove & Discard patch after 12 hours of wear each day.  Medication Changes at Hospital Discharge: -Changed None ID  Medications Discontinued at Hospital Discharge: -Stopped None ID  Medications that remain the same after Hospital Discharge:??  -All other medications will remain the same.    Medications: Outpatient Encounter Medications as of 12/03/2021  Medication Sig   acetaminophen (TYLENOL) 500 MG tablet Take 1,000 mg by mouth 2 (two) times daily as needed for mild pain or moderate pain.    Alcohol Swabs (B-D SINGLE USE SWABS REGULAR) PADS USE TO CHECK BLOOD SUGAR DAILY   aspirin EC 81 MG tablet Take 81 mg by mouth daily.   atorvastatin (LIPITOR) 20 MG tablet TAKE 1 TABLET EVERY DAY   Blood Glucose Calibration (TRUE METRIX LEVEL 1) Low SOLN Use as directed with glucose meter   carvedilol (COREG) 3.125 MG tablet TAKE 1 TABLET TWICE DAILY WITH MEALS   cyanocobalamin 100 MCG tablet Take 100 mcg by mouth daily.   cyclobenzaprine (FLEXERIL) 5 MG tablet Take 1 tablet (5 mg total) by mouth 3 (three) times daily as needed.   FARXIGA 5 MG TABS tablet TAKE 1 TABLET (5 MG TOTAL) DAILY BEFORE BREAKFAST   ferrous sulfate 325 (65 FE) MG tablet Take 325 mg by mouth 2 (two) times daily with a meal.   furosemide (LASIX) 40 MG tablet Take 0.5-1 tablets (20-40 mg total) by mouth daily.   glucose blood (TRUE METRIX BLOOD GLUCOSE TEST) test strip TEST BLOOD SUGAR EVERY DAY   JANUMET XR [phone] MG TB24 TAKE 1 TABLET EVERY DAY   levothyroxine (SYNTHROID) 75 MCG tablet Take 1 tablet (75 mcg total) by mouth daily.   lidocaine (LIDODERM) 5 % Place 1 patch onto the skin every 12 (twelve) hours as needed for up to 10 days. Remove & Discard patch after 12 hours of wear each day.   meloxicam (MOBIC) 7.5 MG tablet Take 1 tablet (7.5 mg total) by mouth daily.   Multiple Vitamin (MULTIVITAMIN) tablet Take 1 tablet by mouth daily.   pantoprazole (PROTONIX) 40 MG tablet TAKE 1 TABLET (40 MG TOTAL) BY MOUTH DAILY.   sacubitril-valsartan (ENTRESTO) 24-26 MG Take 0.5 tablets by  mouth 2 (two) times daily.   senna (SENOKOT) 8.6 MG tablet Take 1 tablet by mouth daily as needed.   TRUEplus Lancets 33G MISC CHECK BLOOD SUGAR ONE TIME DAILY   No facility-administered encounter medications on file as of 12/03/2021.   Care Gaps: Tetanus/TDAP Zoster Vaccines Diabetic Eye Exam Diabetic Foot Exam (patient saw Podiatry 09/02/2021)  Star Rating Drugs: Janumet XR [phone] mg last filled on 09/30/2021 for a 90-Day supply  with [facility] Farxiga 5 mg last filled on 07/07/2021 for a 90-Day supply with [facility] Atorvastatin 20 mg last filled on 10/12/2021 for a 90-Day supply with [facility] Entresto 24 mg-26 mg last filled on 01/020/2023 for a 90-Day supply with [facility]   Recent Relevant Labs: Lab Results  Component Value Date/Time   HGBA1C 7.0 (A) 09/24/2021 11:18 AM   HGBA1C 6.8 (A) 05/24/2021 03:54 PM   HGBA1C 8.2 (H) 04/04/2018 03:02 PM   HGBA1C 6.3 02/20/2014 07:57 PM   MICROALBUR negative 06/02/2020 10:36 AM   MICROALBUR negative 12/04/2018 11:50 AM    Kidney Function Lab Results  Component Value Date/Time   CREATININE 0.81 09/27/2021 02:19 PM   CREATININE 0.89 04/12/2021 11:14 AM   CREATININE 0.70 02/04/2015 02:26 PM   CREATININE 0.89 11/10/2014 11:47 AM   GFR 88.96 02/05/2015 10:34 AM   GFRNONAA 69 07/01/2020 11:26 AM   GFRNONAA >60 02/04/2015 02:26 PM   GFRAA 79 07/01/2020 11:26 AM   GFRAA >60 02/04/2015 02:26 PM    Current antihyperglycemic regimen:  Janumet XR 100mg -1000 mg 1 tablet daily Farxiga 5 mg 1 tablet daily  What recent interventions/DTPs have been made to improve glycemic control:  None ID  Have there been any recent hospitalizations or ED visits since last visit with CPP? Yes for Back Pain  Adherence Review: Is the patient currently on a STATIN medication? Yes Is the patient currently on ACE/ARB medication? Yes Does the patient have >5 day gap between last estimated fill dates? No  Patient has a telephone appointment with , 01/07/2022 @ 1530  12/03/2021 LVM requesting patient to return my call 12/06/2021 LVM requesting patient to return my call 12/07/2021 LVM requesting patient to return my call  I have attempted without success to contact this patient by phone three times to do her Monthly Disease State Call. I left a Voice message for patient to return my call.  12/09/2021, CPA/CMA Clinical Pharmacist Assistant Phone:  [phone]

## 2021-12-06 ENCOUNTER — Ambulatory Visit: Payer: Medicare HMO | Admitting: Podiatry

## 2021-12-06 ENCOUNTER — Encounter: Payer: Self-pay | Admitting: Podiatry

## 2021-12-06 ENCOUNTER — Other Ambulatory Visit: Payer: Self-pay

## 2021-12-06 DIAGNOSIS — E119 Type 2 diabetes mellitus without complications: Secondary | ICD-10-CM

## 2021-12-06 DIAGNOSIS — B351 Tinea unguium: Secondary | ICD-10-CM | POA: Diagnosis not present

## 2021-12-06 DIAGNOSIS — M79674 Pain in right toe(s): Secondary | ICD-10-CM | POA: Diagnosis not present

## 2021-12-06 NOTE — Progress Notes (Signed)
This patient returns to my office for at risk foot care.  This patient requires this care by a professional since this patient will be at risk due to having type 2 diabetes and thrombocytopenia..  This patient is unable to cut nails herself since the patient cannot reach her nails.These nails are painful walking and wearing shoes.  This patient presents for at risk foot care today.  General Appearance  Alert, conversant and in no acute stress.  Vascular  Dorsalis pedis  are palpable  Bilaterally. Posterior tibial pulses are absent  B/L. Capillary return is within normal limits  bilaterally. Cold feet  Bilaterally.  Absent digital hair.  Neurologic  Senn-Weinstein monofilament wire test within normal limits  bilaterally. Muscle power within normal limits bilaterally.  Nails Thick disfigured discolored nails with subungual debris with incurvated nails both borders right hallux.. No evidence of bacterial infection or drainage bilaterally.  Orthopedic  No limitations of motion  feet .  No crepitus or effusions noted.  No bony pathology or digital deformities noted.  Skin  Thin skin  skin with no porokeratosis noted bilaterally.  No signs of infections or ulcers noted.     Onychomycosis  Pain in right toes  Pain in left toes  Consent was obtained for treatment procedures.   Mechanical debridement of nail both hallux. performed with a nail nipper.  Filed with dremel without incident.    Return office visit   3 months                   Told patient to return for periodic foot care and evaluation due to potential at risk complications.   Demere Dotzler DPM  

## 2021-12-15 ENCOUNTER — Other Ambulatory Visit: Payer: Self-pay | Admitting: Family Medicine

## 2021-12-15 DIAGNOSIS — E119 Type 2 diabetes mellitus without complications: Secondary | ICD-10-CM

## 2021-12-15 DIAGNOSIS — I5022 Chronic systolic (congestive) heart failure: Secondary | ICD-10-CM

## 2021-12-15 NOTE — Telephone Encounter (Signed)
No protocol for alcohol swabs. Please assess. Requested Prescriptions  Pending Prescriptions Disp Refills   FARXIGA 5 MG TABS tablet [Pharmacy Med Name: FARXIGA 5 MG Tablet] 90 tablet 0    Sig: TAKE 1 TABLET [place]     Endocrinology:  Diabetes - SGLT2 Inhibitors Passed - 12/15/2021  6:37 PM      Passed - Cr in normal range and within 360 days    Creatinine  Date Value Ref Range Status  02/04/2015 0.70 mg/dL Final    Comment:    0.44-1.00 NOTE: New Reference Range  01/13/15    Creatinine, Ser  Date Value Ref Range Status  09/27/2021 0.81 0.57 - 1.00 mg/dL Final   Creatinine, POC  Date Value Ref Range Status  12/04/2018 n/a mg/dL Final         Passed - HBA1C is between 0 and 7.9 and within 180 days    Hemoglobin A1C  Date Value Ref Range Status  09/24/2021 7.0 (A) 4.0 - 5.6 % Final  02/20/2014 6.3 4.2 - 6.3 % Final    Comment:    The American Diabetes Association recommends that a primary goal of therapy should be <7% and that physicians should reevaluate the treatment regimen in patients with HbA1c values consistently >8%.    Hgb A1c MFr Bld  Date Value Ref Range Status  04/04/2018 8.2 (H) 4.8 - 5.6 % Final    Comment:             Prediabetes: 5.7 - 6.4          Diabetes: >6.4          Glycemic control for adults with diabetes: <7.0          Passed - eGFR in normal range and within 360 days    EGFR (African American)  Date Value Ref Range Status  02/04/2015 >60  Final   GFR calc Af Amer  Date Value Ref Range Status  07/01/2020 79 >59 mL/min/1.73 Final    Comment:    **[facility] currently reports eGFR in compliance with the current**   recommendations of the [employer]. [facility] will   update reporting as new guidelines are published from the NKF-ASN   Task force.    EGFR (Non-African Amer.)  Date Value Ref Range Status  02/04/2015 >60  Final    Comment:    eGFR values <1m/min/1.73 m2 may be an indication of  chronic kidney disease (CKD). Calculated eGFR is useful in patients with stable renal function. The eGFR calculation will not be reliable in acutely ill patients when serum creatinine is changing rapidly. It is not useful in patients on dialysis. The eGFR calculation may not be applicable to patients at the low and high extremes of body sizes, pregnant women, and vegetarians.    GFR calc non Af Amer  Date Value Ref Range Status  07/01/2020 69 >59 mL/min/1.73 Final   GFR  Date Value Ref Range Status  02/05/2015 88.96 >60.00 mL/min Final   eGFR  Date Value Ref Range Status  09/27/2021 76 >59 mL/min/1.73 Final         Passed - Valid encounter within last 6 months    Recent Outpatient Visits          2 weeks ago [facility]   2 months ago Type 2 diabetes mellitus with other specified complication, without long-term current use of insulin ([facility] Birdie [facility], [facility]   6 months ago Type 2 diabetes mellitus with other specified complication, without long-term current use of insulin [facility])   [facility] Birdie Sons, MD   8 months ago Epigastric pain   [facility] Birdie Sons, MD   11 months ago Type 2 diabetes mellitus with other specified complication, without long-term current use of insulin [facility])   [facility] Birdie Sons, MD      Future Appointments            In 1 month Fisher, Kirstie Peri, MD [facility]           Refused Prescriptions Disp Refills   Alcohol Swabs (DROPSAFE ALCOHOL PREP) 70 % PADS [Pharmacy Med Name: DROPSAFE ALCOHOL PREP PADS 70 % Pad] 100 each 4    Sig: USE EVERY DAY     Off-Protocol Failed - 12/15/2021  6:37 PM      Failed - Medication not assigned to a protocol, review manually.      Passed - Valid encounter within last 12 months    Recent Outpatient Visits          2 weeks  ago Hospital discharge follow-up   [facility] Tally Joe T, FNP   2 months ago Type 2 diabetes mellitus with other specified complication, without long-term current use of insulin [facility])   [facility] Birdie Sons, MD   6 months ago Type 2 diabetes mellitus with other specified complication, without long-term current use of insulin [facility])   [facility] Birdie Sons, MD   8 months ago Epigastric pain   [facility] Birdie Sons, MD   11 months ago Type 2 diabetes mellitus with other specified complication, without long-term current use of insulin [facility])   [facility] Birdie Sons, MD      Future Appointments            In 1 month Fisher, Kirstie Peri, MD [facility] [place]           \

## 2021-12-16 ENCOUNTER — Ambulatory Visit (INDEPENDENT_AMBULATORY_CARE_PROVIDER_SITE_OTHER): Payer: Medicare HMO

## 2021-12-16 DIAGNOSIS — Z0000 Encounter for general adult medical examination without abnormal findings: Secondary | ICD-10-CM | POA: Diagnosis not present

## 2021-12-16 DIAGNOSIS — I428 Other cardiomyopathies: Secondary | ICD-10-CM | POA: Diagnosis not present

## 2021-12-16 DIAGNOSIS — I10 Essential (primary) hypertension: Secondary | ICD-10-CM | POA: Diagnosis not present

## 2021-12-16 LAB — CUP PACEART REMOTE DEVICE CHECK
Battery Remaining Longevity: 3 mo
Battery Voltage: 2.77 V
Brady Statistic AP VP Percent: 0 %
Brady Statistic AP VS Percent: 0 %
Brady Statistic AS VP Percent: 98.7 %
Brady Statistic AS VS Percent: 1.29 %
Brady Statistic RA Percent Paced: 0.01 %
Brady Statistic RV Percent Paced: 4.23 %
Date Time Interrogation Session: 20230209012203
HighPow Impedance: 46 Ohm
Implantable Lead Implant Date: 20160409
Implantable Lead Implant Date: 20160409
Implantable Lead Implant Date: 20160409
Implantable Lead Location: 753858
Implantable Lead Location: 753859
Implantable Lead Location: 753860
Implantable Lead Model: 4598
Implantable Lead Model: 5076
Implantable Pulse Generator Implant Date: 20160409
Lead Channel Impedance Value: 1007 Ohm
Lead Channel Impedance Value: 1083 Ohm
Lead Channel Impedance Value: 1178 Ohm
Lead Channel Impedance Value: 1235 Ohm
Lead Channel Impedance Value: 1254 Ohm
Lead Channel Impedance Value: 342 Ohm
Lead Channel Impedance Value: 399 Ohm
Lead Channel Impedance Value: 456 Ohm
Lead Channel Impedance Value: 513 Ohm
Lead Channel Impedance Value: 589 Ohm
Lead Channel Impedance Value: 665 Ohm
Lead Channel Impedance Value: 722 Ohm
Lead Channel Impedance Value: 950 Ohm
Lead Channel Pacing Threshold Amplitude: 0.5 V
Lead Channel Pacing Threshold Amplitude: 0.75 V
Lead Channel Pacing Threshold Amplitude: 2.125 V
Lead Channel Pacing Threshold Pulse Width: 0.4 ms
Lead Channel Pacing Threshold Pulse Width: 0.4 ms
Lead Channel Pacing Threshold Pulse Width: 0.8 ms
Lead Channel Sensing Intrinsic Amplitude: 0.375 mV
Lead Channel Sensing Intrinsic Amplitude: 0.375 mV
Lead Channel Sensing Intrinsic Amplitude: 8.75 mV
Lead Channel Sensing Intrinsic Amplitude: 8.75 mV
Lead Channel Setting Pacing Amplitude: 1.5 V
Lead Channel Setting Pacing Amplitude: 2 V
Lead Channel Setting Pacing Amplitude: 2.5 V
Lead Channel Setting Pacing Pulse Width: 0.4 ms
Lead Channel Setting Pacing Pulse Width: 1 ms
Lead Channel Setting Sensing Sensitivity: 0.45 mV

## 2021-12-16 NOTE — Progress Notes (Signed)
Virtual Visit via Telephone Note  I connected with  Bethany [name] on 12/16/21 at  3:40 PM EST by telephone and verified that I am speaking with the correct person using two identifiers.  Location: Patient: home Provider: [facility] Persons participating in the virtual visit: [place]   I discussed the limitations, risks, security and privacy concerns of performing an evaluation and management service by telephone and the availability of in person appointments. The patient expressed understanding and agreed to proceed.  Interactive audio and video telecommunications were attempted between this nurse and patient, however failed, due to patient having technical difficulties OR patient did not have access to video capability.  We continued and completed visit with audio only.  Some vital signs may be absent or patient reported.   Bethany David, LPN  Subjective:   Bethany [name] is a 76 y.o. female who presents for Medicare Annual (Subsequent) preventive examination.  Review of Systems           Objective:    Today's Vitals   12/16/21 1528  PainSc: 0-No pain   There is no height or weight on file to calculate BMI.  Advanced Directives 11/26/2021 04/11/2021 04/09/2020 12/16/2019 04/09/2019 03/07/2019 09/01/2018  Does Patient Have a Medical Advance Directive? No No Yes No Yes No No  Type of Advance Directive - Public librarian;Living will - [place];Living will - -  Does patient want to make changes to medical advance directive? - - - - - - -  Copy of [place] in Chart? - - Yes - validated most recent copy scanned in chart (See row information) - Yes - validated most recent copy scanned in chart (See row information) - -  Would patient like information on creating a medical advance directive? No - Patient declined - - - - No - Patient declined No - Patient declined    Current Medications (verified) Outpatient Encounter  Medications as of 12/16/2021  Medication Sig   acetaminophen (TYLENOL) 500 MG tablet Take 1,000 mg by mouth 2 (two) times daily as needed for mild pain or moderate pain.   Alcohol Swabs (B-D SINGLE USE SWABS REGULAR) PADS USE TO CHECK BLOOD SUGAR DAILY   aspirin EC 81 MG tablet Take 81 mg by mouth daily.   atorvastatin (LIPITOR) 20 MG tablet TAKE 1 TABLET EVERY DAY   Blood Glucose Calibration (TRUE METRIX LEVEL 1) Low SOLN Use as directed with glucose meter   carvedilol (COREG) 3.125 MG tablet TAKE 1 TABLET TWICE DAILY WITH MEALS   cyanocobalamin 100 MCG tablet Take 100 mcg by mouth daily.   cyclobenzaprine (FLEXERIL) 5 MG tablet Take 1 tablet (5 mg total) by mouth 3 (three) times daily as needed. (Patient not taking: Reported on 12/06/2021)   FARXIGA 5 MG TABS tablet TAKE 1 TABLET EVERY DAY BEFORE BREAKFAST   ferrous sulfate 325 (65 FE) MG tablet Take 325 mg by mouth 2 (two) times daily with a meal.   furosemide (LASIX) 40 MG tablet Take 0.5-1 tablets (20-40 mg total) by mouth daily.   glucose blood (TRUE METRIX BLOOD GLUCOSE TEST) test strip TEST BLOOD SUGAR EVERY DAY   JANUMET XR [phone] MG TB24 TAKE 1 TABLET EVERY DAY   levothyroxine (SYNTHROID) 75 MCG tablet Take 1 tablet (75 mcg total) by mouth daily.   meloxicam (MOBIC) 7.5 MG tablet Take 1 tablet (7.5 mg total) by mouth daily.   Multiple Vitamin (MULTIVITAMIN) tablet Take 1 tablet  by mouth daily.   pantoprazole (PROTONIX) 40 MG tablet TAKE 1 TABLET (40 MG TOTAL) BY MOUTH DAILY.   sacubitril-valsartan (ENTRESTO) 24-26 MG Take 0.5 tablets by mouth 2 (two) times daily.   senna (SENOKOT) 8.6 MG tablet Take 1 tablet by mouth daily as needed.   TRUEplus Lancets 33G MISC CHECK BLOOD SUGAR ONE TIME DAILY   No facility-administered encounter medications on file as of 12/16/2021.    Allergies (verified) Other and Sulfa antibiotics   History: Past Medical History:  Diagnosis Date   AICD (automatic cardioverter/defibrillator) present     Anemia    Anxiety    CHF (congestive heart failure) (HCC)    Complication of anesthesia    Depression    Diabetes ([place])    Dilated cardiomyopathy (HCC)    Diverticulosis    Dyspnea    Dysrhythmia    Edema    FEET/ANKLES OCCAS   GERD (gastroesophageal reflux disease)    Hyperlipidemia    Hypothyroidism    LV dysfunction    Orthopnea    USES 3 PILLOWS   PONV (postoperative nausea and vomiting)    Reflux    Thrombocytopenia (HCC)    Thyroid disease    VHD (valvular heart disease)    Past Surgical History:  Procedure Laterality Date   ABDOMINAL HYSTERECTOMY     BI-VENTRICULAR IMPLANTABLE CARDIOVERTER DEFIBRILLATOR [name] 02/11/2015   Procedure: BI-VENTRICULAR IMPLANTABLE CARDIOVERTER DEFIBRILLATOR  (CRT-D);  Surgeon: Deboraha Sprang, MD;  Location: [facility] CATH LAB;  Service: Cardiovascular;  Laterality: [name];   BONE MARROW BIOPSY  2007   spine   BREAST BIOPSY Right    neg   CARDIAC CATHETERIZATION     CATARACT EXTRACTION W/PHACO Right 01/03/2017   Procedure: CATARACT EXTRACTION PHACO AND INTRAOCULAR LENS PLACEMENT (IOC);  Surgeon: Leandrew Koyanagi, MD;  Location: [facility] ORS;  Service: Ophthalmology;  Laterality: Right;  [place] 00:50 AP% 19.5 CDE 9.94 fluid pack lot # [id number]   CATARACT EXTRACTION W/PHACO Left 10/27/2016   Procedure: CATARACT EXTRACTION PHACO AND INTRAOCULAR LENS PLACEMENT (IOC);  Surgeon: Leandrew Koyanagi, MD;  Location: [facility] ORS;  Service: Ophthalmology;  Laterality: Left;  [place]  01:12 AP%  21.0 CDE  11.59 Fluid pack lot # [id number]   CHOLECYSTECTOMY     INSERT / REPLACE / REMOVE PACEMAKER     AICD   LEAD REVISION [name] 02/11/2015   Procedure: LEAD REVISION;  Surgeon: Deboraha Sprang, MD;  Location: [facility] CATH LAB;  Service: Cardiovascular;  Laterality: [name];   PARTIAL HYSTERECTOMY  1977   TOOTH EXTRACTION  09/23/2015   Family History  Problem Relation Age of Onset   Heart disease Mother    Heart attack Mother    Prostate cancer Father    Ulcers Father    Stroke  Sister    Heart attack Sister    Diabetes Sister    Diabetes Sister    Heart attack Brother    Stroke Brother    Breast cancer Neg Hx    Social History   Socioeconomic History   Marital status: Divorced    Spouse name: Not on file   Number of children: 4   Years of education: Not on file   Highest education level: GED or equivalent  Occupational History   Occupation: retired  Tobacco Use   Smoking status: Never   Smokeless tobacco: Former    Types: Snuff    Quit date: 10/27/1969  Vaping Use   Vaping Use: Never used  Substance and Sexual Activity  Alcohol use: No    Alcohol/week: 0.0 standard drinks   Drug use: No   Sexual activity: Not on file  Other Topics Concern   Not on file  Social History Narrative   Not on file   Social Determinants of Health   Financial Resource Strain: Low Risk    Difficulty of Paying Living Expenses: Not hard at all  Food Insecurity: Not on file  Transportation Needs: Not on file  Physical Activity: Not on file  Stress: Not on file  Social Connections: Not on file    Tobacco Counseling Counseling given: Not Answered   Clinical Intake:  Pre-visit preparation completed: Yes  Pain : No/denies pain Pain Score: 0-No pain     Nutritional Risks: None Diabetes: No  How often do you need to have someone help you when you read instructions, pamphlets, or other written materials from your doctor or pharmacy?: 1 - Never  Diabetic?no  Interpreter Needed?: No  Information entered by :: Kirke Shaggy, LPN   Activities of Daily Living In your present state of health, do you have any difficulty performing the following activities: 12/15/2021 04/12/2021  Hearing? N N  Vision? N N  Difficulty concentrating or making decisions? Tempie Donning  Walking or climbing stairs? N N  Dressing or bathing? N N  Doing errands, shopping? N N  Preparing Food and eating ? N -  Using the Toilet? N -  In the past six months, have you accidently leaked urine?  Y -  Do you have problems with loss of bowel control? N -  Managing your Medications? N -  Managing your Finances? N -  Housekeeping or managing your Housekeeping? N -  Some recent data might be hidden    Patient Care Team: Birdie Sons, MD as PCP - General (Family Medicine) Corey Skains, MD as Consulting Physician (Cardiology) Deboraha Sprang, MD as Consulting Physician (Cardiology) Leandrew Koyanagi, MD as Referring Physician (Ophthalmology) Eulogio Bear, MD as Consulting Physician (Ophthalmology) Gardiner Barefoot, DPM as Consulting Physician (Podiatry) Germaine Pomfret, [facility] (Pharmacist)  Indicate any recent Medical Services you may have received from other than [facility] providers in the past year (date may be approximate).     Assessment:   This is a routine wellness examination for [place].  Hearing/Vision screen No results found.  Dietary issues and exercise activities discussed:     Goals Addressed   None    Depression Screen PHQ 2/9 Scores 04/12/2021 01/12/2021 09/14/2020 04/09/2020 04/09/2019 04/09/2019 04/04/2018  PHQ - 2 Score 0 0 0 0 0 0 0  PHQ- 9 Score '12 12 8 ' - 3 - -    Fall Risk Fall Risk  12/15/2021 04/12/2021 01/12/2021 04/09/2020 06/14/2019  Falls in the past year? 0 0 0 0 0  Number falls in past yr: 0 0 0 0 -  Injury with Fall? 0 0 0 0 -  Comment - - - - -  Risk for fall due to : - No Fall Risks - - -  Follow up - Falls evaluation completed - - -    FALL RISK PREVENTION PERTAINING TO THE HOME:  Any stairs in or around the home? Yes  If so, are there any without handrails? No  Home free of loose throw rugs in walkways, pet beds, electrical cords, etc? Yes  Adequate lighting in your home to reduce risk of falls? Yes   ASSISTIVE DEVICES UTILIZED TO PREVENT FALLS:  Life alert? No  Use of a  cane, walker or w/c? No  Grab bars in the bathroom? No  Shower chair or bench in shower? No  Elevated toilet seat or a handicapped toilet? No   Cognitive  Function:    6CIT Screen 04/09/2020 04/04/2018 03/23/2017  What Year? 0 points 0 points 0 points  What month? 0 points 0 points 0 points  What time? 0 points 0 points 0 points  Count back from 20 0 points 0 points 0 points  Months in reverse 0 points 0 points 0 points  Repeat phrase 2 points 6 points 6 points  Total Score '2 6 6    ' Immunizations Immunization History  Administered Date(s) Administered   Fluad Quad(high Dose 65+) 07/22/2019, 09/14/2020   Influenza, High Dose Seasonal PF 07/07/2016, 10/09/2017, 08/01/2018, 08/21/2021   Influenza,inj,Quad PF,6+ Mos 08/14/2015   PFIZER Comirnaty(Gray Top)Covid-19 Tri-Sucrose Vaccine 01/02/2020, 01/07/2020, 08/24/2020, 08/30/2021   PFIZER(Purple Top)SARS-COV-2 Vaccination 12/13/2019, 01/07/2020   Pneumococcal Conjugate-13 09/18/2014   Pneumococcal Polysaccharide-23 09/11/2015    TDAP status: Due, Education has been provided regarding the importance of this vaccine. Advised may receive this vaccine at local pharmacy or Health Dept. Aware to provide a copy of the vaccination record if obtained from local pharmacy or Health Dept. Verbalized acceptance and understanding.  Flu Vaccine status: Up to date  Pneumococcal vaccine status: Up to date  Covid-19 vaccine status: Completed vaccines  Qualifies for Shingles Vaccine? Yes   Zostavax completed No   Shingrix Completed?: No.    Education has been provided regarding the importance of this vaccine. Patient has been advised to call insurance company to determine out of pocket expense if they have not yet received this vaccine. Advised may also receive vaccine at local pharmacy or Health Dept. Verbalized acceptance and understanding.  Screening Tests Health Maintenance  Topic Date Due   TETANUS/TDAP  Never done   Zoster Vaccines- Shingrix (1 of 2) Never done   OPHTHALMOLOGY EXAM  03/10/2021   DEXA SCAN  02/04/2022   HEMOGLOBIN A1C  03/24/2022   FOOT EXAM  12/06/2022   COLONOSCOPY (Pts  45-42yr Insurance coverage will need to be confirmed)  05/08/2023   Pneumonia Vaccine 76 Years old  Completed   INFLUENZA VACCINE  Completed   COVID-19 Vaccine  Completed   Hepatitis C Screening  Completed   HPV VACCINES  Aged Out    Health Maintenance  Health Maintenance Due  Topic Date Due   TETANUS/TDAP  Never done   Zoster Vaccines- Shingrix (1 of 2) Never done   OPHTHALMOLOGY EXAM  03/10/2021    Colorectal cancer screening: Type of screening: Colonoscopy. Completed 05/07/13. Repeat every 10 years- AGED OUT  Mammogram status: No longer required due to AGE.  Bone Density status: Completed 02/05/20. Results reflect: Bone density results: OSTEOPOROSIS. Repeat every 2 years.- DECLINED REFERRAL  Lung Cancer Screening: (Low Dose CT Chest recommended if Age 76-80years, 30 pack-year currently smoking OR have quit w/in 15years.) does not qualify.   Additional Screening:  Hepatitis C Screening: does qualify; Completed 03/23/17  Vision Screening: Recommended annual ophthalmology exams for early detection of glaucoma and other disorders of the eye. Is the patient up to date with their annual eye exam?  Yes  Who is the provider or what is the name of the office in which the patient attends annual [facility] exams[facility] is not established with a provider, would they like to be referred to a provider to establish care? No .   Dental Screening: Recommended  annual dental exams for proper oral hygiene  Community Resource Referral / Chronic Care Management: CRR required this visit?  No   CCM required this visit?  No      Plan:     I have personally reviewed and noted the following in the patients chart:   Medical and social history Use of alcohol, tobacco or illicit drugs  Current medications and supplements including opioid prescriptions.  Functional ability and status Nutritional status Physical activity Advanced directives List of other  physicians Hospitalizations, surgeries, and ER visits in previous 12 months Vitals Screenings to include cognitive, depression, and falls Referrals and appointments  In addition, I have reviewed and discussed with patient certain preventive protocols, quality metrics, and best practice recommendations. A written personalized care plan for preventive services as well as general preventive health recommendations were provided to patient.     Bethany David, LPN   [id number]/8118   Nurse Notes: none

## 2021-12-16 NOTE — Patient Instructions (Signed)
[name] , Thank you for taking time to come for your Medicare Wellness Visit. I appreciate your ongoing commitment to your health goals. Please review the following plan we discussed and let me know if I can assist you in the future.   Screening recommendations/referrals: Colonoscopy: AGED OUT Mammogram: AGED OUT Bone Density: 02/05/20, DECLINED REFERRAL Recommended yearly ophthalmology/optometry visit for glaucoma screening and checkup Recommended yearly dental visit for hygiene and checkup  Vaccinations: Influenza vaccine: 08/21/21 Pneumococcal vaccine: 09/11/15 Tdap vaccine: N/D Shingles vaccine: N/D   Covid-19:12/13/19, 01/07/20, 08/24/20, 08/30/21  Advanced directives: NO  Conditions/risks identified: NONE  Next appointment: Follow up in one year for your annual wellness visit - 12/20/22 @ 3PM BY PHONE   Preventive Care 65 Years and Older, Female Preventive care refers to lifestyle choices and visits with your health care provider that can promote health and wellness. What does preventive care include? A yearly physical exam. This is also called an annual well check. Dental exams once or twice a year. Routine eye exams. Ask your health care provider how often you should have your eyes checked. Personal lifestyle choices, including: Daily care of your teeth and gums. Regular physical activity. Eating a healthy diet. Avoiding tobacco and drug use. Limiting alcohol use. Practicing safe sex. Taking low-dose aspirin every day. Taking vitamin and mineral supplements as recommended by your health care provider. What happens during an annual well check? The services and screenings done by your health care provider during your annual well check will depend on your age, overall health, lifestyle risk factors, and family history of disease. Counseling  Your health care provider may ask you questions about your: Alcohol use. Tobacco use. Drug use. Emotional well-being. Home and  relationship well-being. Sexual activity. Eating habits. History of falls. Memory and ability to understand (cognition). Work and work Astronomer. Reproductive health. Screening  You may have the following tests or measurements: Height, weight, and BMI. Blood pressure. Lipid and cholesterol levels. These may be checked every 5 years, or more frequently if you are over 21 years old. Skin check. Lung cancer screening. You may have this screening every year starting at age 53 if you have a 30-pack-year history of smoking and currently smoke or have quit within the past 15 years. Fecal occult blood test (FOBT) of the stool. You may have this test every year starting at age 88. Flexible sigmoidoscopy or colonoscopy. You may have a sigmoidoscopy every 5 years or a colonoscopy every 10 years starting at age 30. Hepatitis C blood test. Hepatitis B blood test. Sexually transmitted disease (STD) testing. Diabetes screening. This is done by checking your blood sugar (glucose) after you have not eaten for a while (fasting). You may have this done every 1-3 years. Bone density scan. This is done to screen for osteoporosis. You may have this done starting at age 57. Mammogram. This may be done every 1-2 years. Talk to your health care provider about how often you should have regular mammograms. Talk with your health care provider about your test results, treatment options, and if necessary, the need for more tests. Vaccines  Your health care provider may recommend certain vaccines, such as: Influenza vaccine. This is recommended every year. Tetanus, diphtheria, and acellular pertussis (Tdap, Td) vaccine. You may need a Td booster every 10 years. Zoster vaccine. You may need this after age 76. Pneumococcal 13-valent conjugate (PCV13) vaccine. One dose is recommended after age 81. Pneumococcal polysaccharide (PPSV23) vaccine. One dose is recommended after age 110.  Talk to your health care provider  about which screenings and vaccines you need and how often you need them. This information is not intended to replace advice given to you by your health care provider. Make sure you discuss any questions you have with your health care provider. Document Released: 11/20/2015 Document Revised: 07/13/2016 Document Reviewed: 08/25/2015 Elsevier Interactive Patient Education  2017 [place] Prevention in the Home Falls can cause injuries. They can happen to people of all ages. There are many things you can do to make your home safe and to help prevent falls. What can I do on the outside of my home? Regularly fix the edges of walkways and driveways and fix any cracks. Remove anything that might make you trip as you walk through a door, such as a raised step or threshold. Trim any bushes or trees on the path to your home. Use bright outdoor lighting. Clear any walking paths of anything that might make someone trip, such as rocks or tools. Regularly check to see if handrails are loose or broken. Make sure that both sides of any steps have handrails. Any raised decks and porches should have guardrails on the edges. Have any leaves, snow, or ice cleared regularly. Use sand or salt on walking paths during winter. Clean up any spills in your garage right away. This includes oil or grease spills. What can I do in the bathroom? Use night lights. Install grab bars by the toilet and in the tub and shower. Do not use towel bars as grab bars. Use non-skid mats or decals in the tub or shower. If you need to sit down in the shower, use a plastic, non-slip stool. Keep the floor dry. Clean up any water that spills on the floor as soon as it happens. Remove soap buildup in the tub or shower regularly. Attach bath mats securely with double-sided non-slip rug tape. Do not have throw rugs and other things on the floor that can make you trip. What can I do in the bedroom? Use night lights. Make sure  that you have a light by your bed that is easy to reach. Do not use any sheets or blankets that are too big for your bed. They should not hang down onto the floor. Have a firm chair that has side arms. You can use this for support while you get dressed. Do not have throw rugs and other things on the floor that can make you trip. What can I do in the kitchen? Clean up any spills right away. Avoid walking on wet floors. Keep items that you use a lot in easy-to-reach places. If you need to reach something above you, use a strong step stool that has a grab bar. Keep electrical cords out of the way. Do not use floor polish or wax that makes floors slippery. If you must use wax, use non-skid floor wax. Do not have throw rugs and other things on the floor that can make you trip. What can I do with my stairs? Do not leave any items on the stairs. Make sure that there are handrails on both sides of the stairs and use them. Fix handrails that are broken or loose. Make sure that handrails are as long as the stairways. Check any carpeting to make sure that it is firmly attached to the stairs. Fix any carpet that is loose or worn. Avoid having throw rugs at the top or bottom of the stairs. If you do have throw  rugs, attach them to the floor with carpet tape. Make sure that you have a light switch at the top of the stairs and the bottom of the stairs. If you do not have them, ask someone to add them for you. What else can I do to help prevent falls? Wear shoes that: Do not have high heels. Have rubber bottoms. Are comfortable and fit you well. Are closed at the toe. Do not wear sandals. If you use a stepladder: Make sure that it is fully opened. Do not climb a closed stepladder. Make sure that both sides of the stepladder are locked into place. Ask someone to hold it for you, if possible. Clearly mark and make sure that you can see: Any grab bars or handrails. First and last steps. Where the edge of  each step is. Use tools that help you move around (mobility aids) if they are needed. These include: Canes. Walkers. Scooters. Crutches. Turn on the lights when you go into a dark area. Replace any light bulbs as soon as they burn out. Set up your furniture so you have a clear path. Avoid moving your furniture around. If any of your floors are uneven, fix them. If there are any pets around you, be aware of where they are. Review your medicines with your doctor. Some medicines can make you feel dizzy. This can increase your chance of falling. Ask your doctor what other things that you can do to help prevent falls. This information is not intended to replace advice given to you by your health care provider. Make sure you discuss any questions you have with your health care provider. Document Released: 08/20/2009 Document Revised: 03/31/2016 Document Reviewed: 11/28/2014 Elsevier Interactive Patient Education  2017 [employer].

## 2021-12-21 NOTE — Progress Notes (Signed)
Remote ICD transmission.   

## 2021-12-28 ENCOUNTER — Other Ambulatory Visit: Payer: Self-pay | Admitting: Family Medicine

## 2021-12-28 DIAGNOSIS — E119 Type 2 diabetes mellitus without complications: Secondary | ICD-10-CM

## 2022-01-06 ENCOUNTER — Telehealth: Payer: Self-pay

## 2022-01-06 NOTE — Progress Notes (Signed)
? ? ?  Chronic Care Management ?Pharmacy Assistant  ? ?Name: Bethany [name]  MRN: [id number] DOB: 09-23-1946 ? ?Patient called to be reminded of her telephone appointment with Junius Argyle, CPP on 01/07/2022 @ 0830 ? ?Patient aware of appointment date, time, and type of appointment (either telephone or in person). Patient aware to have/bring all medications, supplements, blood pressure and/or blood sugar logs to visit. ? ?Questions: ?Are there any concerns you would like to discuss during your office visit? Not at this time ? ?Are you having any problems obtaining your medications? No issues with obtaining medication ? ?Star Rating Drug: ?Atorvastatin 20 mg last filled on 12/29/2021 for a 90-Day supply with [facility] ?Farxiga 5 mg last filled on 12/16/2021 for a 90-Day supply with [facility] ?Janumet XR [id number] mg last filled on 12/18/2021 for a 90-Day supply with [facility] ?Sacubitril-Valsartan 24-26 mg last filled on 11/08/2021 for a 90-Day supply with [facility] ? ?Any gaps in medications fill history? No ? ?Care Gaps: ?Tetanus/TDAP ?Zoster Vaccine ?Diabetic Eye Exam ? ? ?Lynann Bologna, CPA/CMA ?Clinical Pharmacist Assistant ?Phone: [phone]  ? ?

## 2022-01-07 ENCOUNTER — Ambulatory Visit (INDEPENDENT_AMBULATORY_CARE_PROVIDER_SITE_OTHER): Payer: Medicare HMO

## 2022-01-07 DIAGNOSIS — E1169 Type 2 diabetes mellitus with other specified complication: Secondary | ICD-10-CM

## 2022-01-07 DIAGNOSIS — I5022 Chronic systolic (congestive) heart failure: Secondary | ICD-10-CM

## 2022-01-07 NOTE — Progress Notes (Unsigned)
Chronic Care Management Pharmacy Note  01/07/2022 Name:  Bethany [name] MRN:  [id number] DOB:  [birth year]  Summary: Patient presents for CCM follow-up. Urinary frequency, urinary urgency.   Recommendations/Changes made from today's visit: Continue current medications  Plan: CPP follow-up 6 months  Subjective: Bethany [name] is an 76 y.o. year old female who is a primary patient of Fisher, Kirstie Peri, MD.  The CCM team was consulted for assistance with disease management and care coordination needs.    Engaged with patient by telephone for follow up visit in response to provider referral for pharmacy case management and/or care coordination services.   Consent to Services:  The patient was given information about Chronic Care Management services, agreed to services, and gave verbal consent prior to initiation of services.  Please see initial visit note for detailed documentation.   Patient Care Team: Birdie Sons, MD as PCP - General (Family Medicine) Corey Skains, MD as Consulting Physician (Cardiology) Deboraha Sprang, MD as Consulting Physician (Cardiology) Leandrew Koyanagi, MD as Referring Physician (Ophthalmology) Eulogio Bear, MD as Consulting Physician (Ophthalmology) Gardiner Barefoot, DPM as Consulting Physician (Podiatry) Germaine Pomfret, [facility] (Pharmacist)  Recent office visits: 11/29/21: Patient presented to Tally Joe, FNP for hospital follow-up. Meloxocam 7.5 mg daily.  11/24/20: Patient presented to Dr. Caryn Section for follow-up. A1c 7.0%  04/12/21: Patient presented to Dr. Caryn Section for epigastric pain.   Recent consult visits: 09/08/21: Patient presented to Dr. Nehemiah Massed (Cardiology)   Hospital visits: 11/26/21: Patient presented to [facility] for lumbar strain. Cyclobenzaprine + lidocaine patches.    Objective:  Lab Results  Component Value Date   CREATININE 0.81 09/27/2021   BUN 8 09/27/2021   GFR 88.96 02/05/2015   GFRNONAA 69 07/01/2020   GFRAA 79  07/01/2020   NA 142 09/27/2021   K 4.5 09/27/2021   CALCIUM 9.7 09/27/2021   CO2 27 09/27/2021   GLUCOSE 122 (H) 09/27/2021    Lab Results  Component Value Date/Time   HGBA1C 7.0 (A) 09/24/2021 11:18 AM   HGBA1C 6.8 (A) 05/24/2021 03:54 PM   HGBA1C 8.2 (H) 04/04/2018 03:02 PM   HGBA1C 6.3 02/20/2014 07:57 PM   GFR 88.96 02/05/2015 10:34 AM   MICROALBUR negative 06/02/2020 10:36 AM   MICROALBUR negative 12/04/2018 11:50 AM    Last diabetic Eye exam:  Lab Results  Component Value Date/Time   HMDIABEYEEXA No Retinopathy 03/10/2020 12:00 AM    Last diabetic Foot exam: No results found for: HMDIABFOOTEX   Lab Results  Component Value Date   CHOL 120 01/13/2021   HDL 55 01/13/2021   LDLCALC 49 01/13/2021   TRIG 79 01/13/2021   CHOLHDL 2.2 01/13/2021    Hepatic Function Latest Ref Rng & Units 09/27/2021 04/12/2021 01/13/2021  Total Protein 6.0 - 8.5 g/dL 6.8 6.9 7.0  Albumin 3.7 - 4.7 g/dL 4.2 4.3 4.3  AST 0 - 40 IU/L _0 ALT 0 - 32 IU/L _1 Alk Phosphatase 44 - 121 IU/L 89 89 84  Total Bilirubin 0.0 - 1.2 mg/dL 0.2 0.3 0.3    Lab Results  Component Value Date/Time   TSH 5.140 (H) 09/27/2021 02:19 PM   TSH 2.850 01/13/2021 08:15 AM   FREET4 1.37 09/27/2021 02:19 PM   FREET4 1.28 01/13/2021 08:15 AM    CBC Latest Ref Rng & Units 09/27/2021 04/12/2021 01/13/2021  WBC 3.4 - 10.8 x10E3/uL 3.6 3.6 4.3  Hemoglobin 11.1 - 15.9 g/dL 12.1 12.2 12.2  Hematocrit 34.0 - 46.6 % 35.8 36.7 37.5  Platelets 150 - 450 x10E3/uL 100(LL) 110(L) 115(L)    Lab Results  Component Value Date/Time   VD25OH 99.0 01/13/2021 08:15 AM   VD25OH 83.4 12/30/2019 03:48 PM    Clinical ASCVD: No  The ASCVD Risk score (Arnett DK, et al., 2019) failed to calculate for the following reasons:   The valid total cholesterol range is 130 to 320 mg/dL    Depression screen [facility] 2/9 12/16/2021 04/12/2021 01/12/2021  Decreased Interest 0 0 0  Down, Depressed, Hopeless 0 0 0  PHQ - 2 Score 0 0 0   Altered sleeping - 3 3  Tired, decreased energy - 3 3  Change in appetite - 3 3  Feeling bad or failure about yourself  - 0 0  Trouble concentrating - 3 3  Moving slowly or fidgety/restless - 0 0  Suicidal thoughts - 0 0  PHQ-9 Score - 12 12  Difficult doing work/chores - Not difficult at all Not difficult at all  Some recent data might be hidden    -Last DEXA Scan: 02/05/20   T-Score femoral neck: -1.8  T-Score total hip: -1.3  T-Score lumbar spine: -1.4  T-Score forearm radius: -0.8  10-year probability of major osteoporotic fracture: 8.1%  10-year probability of hip fracture: 1.6%  Social History   Tobacco Use  Smoking Status Never  Smokeless Tobacco Former   Types: Snuff   Quit date: 10/27/1969   BP Readings from Last 3 Encounters:  11/29/21 95/64  11/26/21 106/70  09/24/21 94/65   Pulse Readings from Last 3 Encounters:  11/29/21 73  11/26/21 65  09/24/21 85   Wt Readings from Last 3 Encounters:  11/29/21 112 lb 4.8 oz (50.9 kg)  09/24/21 117 lb (53.1 kg)  07/13/21 115 lb (52.2 kg)   BMI Readings from Last 3 Encounters:  11/29/21 21.22 kg/m  09/24/21 22.11 kg/m  07/13/21 21.73 kg/m    Assessment/Interventions: Review of patient past medical history, allergies, medications, health status, including review of consultants reports, laboratory and other test data, was performed as part of comprehensive evaluation and provision of chronic care management services.   SDOH:  (Social Determinants of Health) assessments and interventions performed: Yes   SDOH Screenings   Alcohol Screen: Low Risk    Last Alcohol Screening Score (AUDIT): 0  Depression (PHQ2-9): Low Risk    PHQ-2 Score: 0  Financial Resource Strain: Low Risk    Difficulty of Paying Living Expenses: Not hard at all  Food Insecurity: No Food Insecurity   Worried About Charity fundraiser in the Last Year: Never true   Ran Out of Food in the Last Year: Never true  Housing: Low Risk    Last  Housing Risk Score: 0  Physical Activity: Insufficiently Active   Days of Exercise per Week: 3 days   Minutes of Exercise per Session: 30 min  Social Connections: Moderately Integrated   Frequency of Communication with Friends and Family: More than three times a week   Frequency of Social Gatherings with Friends and Family: More than three times a week   Attends Religious Services: More than 4 times per year   Active Member of [employer] or Organizations: Yes   Attends Music therapist: More than 4 times per year   Marital Status: Divorced  Stress: No Stress Concern Present   Feeling of Stress : Only a little  Tobacco Use: Medium Risk   Smoking Tobacco Use: Never  Smokeless Tobacco Use: Former   Passive Exposure: Not on Pensions consultant Needs: No Transportation Needs   Lack of Transportation (Medical): No   Lack of Transportation (Non-Medical): No    CCM Care Plan  Allergies  Allergen Reactions   Other Other (See Comments)    NO BLOOD PRODUCTS- JEHOVAHS WITNESS   Sulfa Antibiotics Other (See Comments)    Loss of taste    Medications Reviewed Today     Reviewed by Dionisio David, LPN (Licensed Practical Nurse) on 12/16/21 at 1548  Med List Status: <None>   Medication Order Taking? Sig Documenting Provider Last Dose Status Informant  acetaminophen (TYLENOL) 500 MG tablet [id number] Yes Take 1,000 mg by mouth 2 (two) times daily as needed for mild pain or moderate pain. [provider] Taking Active   Alcohol Swabs (B-D SINGLE USE SWABS REGULAR) PADS [id number] Yes USE TO CHECK BLOOD SUGAR DAILY Birdie Sons, MD Taking Active   aspirin EC 81 MG tablet [id number] Yes Take 81 mg by mouth daily. [provider] Taking Active Self  atorvastatin (LIPITOR) 20 MG tablet [id number] Yes TAKE 1 TABLET EVERY DAY Birdie Sons, MD Taking Active   Blood Glucose Calibration (TRUE METRIX LEVEL 1) Low SOLN [id number] Yes Use as directed with glucose meter  Birdie Sons, MD Taking Active   carvedilol (COREG) 3.125 MG tablet [id number] Yes TAKE 1 TABLET TWICE DAILY WITH MEALS Birdie Sons, MD Taking Active   cyanocobalamin 100 MCG tablet [id number] Yes Take 100 mcg by mouth daily. [provider] Taking Active   cyclobenzaprine (FLEXERIL) 5 MG tablet [id number] No Take 1 tablet (5 mg total) by mouth 3 (three) times daily as needed.  Patient not taking: Reported on 12/06/2021   Melvenia Needles, PA-C Not Taking Active   FARXIGA 5 MG TABS tablet [id number] Yes TAKE 1 TABLET EVERY DAY BEFORE BREAKFAST Birdie Sons, MD Taking Active   ferrous sulfate 325 (65 FE) MG tablet [id number] Yes Take 325 mg by mouth 2 (two) times daily with a meal. [provider] Taking Active Self  furosemide (LASIX) 40 MG tablet [id number] Yes Take 0.5-1 tablets (20-40 mg total) by mouth daily. Birdie Sons, MD Taking Active   glucose blood (TRUE METRIX BLOOD GLUCOSE TEST) test strip [id number] Yes TEST BLOOD SUGAR EVERY DAY Birdie Sons, MD Taking Active   JANUMET XR [phone] MG TB24 [id number] Yes TAKE 1 TABLET EVERY DAY Birdie Sons, MD Taking Active   levothyroxine (SYNTHROID) 75 MCG tablet [id number] Yes Take 1 tablet (75 mcg total) by mouth daily. Birdie Sons, MD Taking Active   meloxicam [facility]) 7.5 MG tablet [id number] Yes Take 1 tablet (7.5 mg total) by mouth daily. Gwyneth Sprout, FNP Taking Active   Multiple Vitamin (MULTIVITAMIN) tablet [id number] Yes Take 1 tablet by mouth daily. [provider] Taking Active Self  pantoprazole (PROTONIX) 40 MG tablet [id number] Yes TAKE 1 TABLET (40 MG TOTAL) BY MOUTH DAILY. Birdie Sons, MD Taking Active   sacubitril-valsartan [facility]) 24-26 MG [id number] Yes Take 0.5 tablets by mouth 2 (two) times daily. [provider] Taking Active   senna (SENOKOT) 8.6 MG tablet [id number] Yes Take 1 tablet by mouth daily as needed. [provider] Taking Active   TRUEplus  Lancets 33G [place] [id number] Yes CHECK BLOOD SUGAR ONE TIME DAILY Birdie Sons, MD Taking Active             Patient Active Problem List  Diagnosis Date Noted   Hospital discharge follow-up 11/29/2021   Acute bilateral low back pain without sciatica 11/29/2021   Excessive daytime sleepiness 02/18/2021   Presence of automatic implantable cardioverter-defibrillator 02/18/2021   Thrombocytopenia ([place]) 06/11/2019   Pain due to onychomycosis of toenail of right foot 05/30/2019   Memory loss 07/12/2016   GERD (gastroesophageal reflux disease) 08/24/2015   Automatic implantable cardioverter-defibrillator in situ 07/29/2015   Hypotension 06/18/2015   Absolute anemia 03/11/2015   Abnormal liver enzymes 03/11/2015   Decreased potassium in the blood 03/11/2015   Hypercholesteremia 03/11/2015   Adult hypothyroidism 03/11/2015   Idiopathic insomnia 03/11/2015   OP (osteoporosis) 03/11/2015   Diabetes mellitus, type 2 ([place]) 03/11/2015   Vitamin D deficiency 03/11/2015   LBBB (left bundle branch block) 88/50/2774   Chronic systolic heart failure ([place]) 09/25/2014   Heart valve disease 04/10/2014    Immunization History  Administered Date(s) Administered   Fluad Quad(high Dose 65+) 07/22/2019, 09/14/2020   Influenza, High Dose Seasonal PF 07/07/2016, 10/09/2017, 08/01/2018, 08/21/2021   Influenza,inj,Quad PF,6+ Mos 08/14/2015   PFIZER Comirnaty(Gray Top)Covid-19 Tri-Sucrose Vaccine 01/02/2020, 01/07/2020, 08/24/2020, 08/30/2021   PFIZER(Purple Top)SARS-COV-2 Vaccination 12/13/2019, 01/07/2020   Pneumococcal Conjugate-13 09/18/2014   Pneumococcal Polysaccharide-23 09/11/2015    Conditions to be addressed/monitored:  Hypertension, Hyperlipidemia, Diabetes, Heart Failure, GERD, Hypothyroidism, and Osteoporosis  There are no care plans that you recently modified to display for this patient.     Medication Assistance: None required.  Patient affirms current coverage meets  needs.  Compliance/Adherence/Medication fill history: Care Gaps: Tetanus Shingrix Covid Vaccine Ophthalmology  Influenza  Star-Rating Drugs: Atorvastatin 20 mg: Last filled 05/05/21 for 90-DS Janumet [phone] mg: Last filled 05/05/21 for 90-DS  Entresto 24-26 mg: Last filled 05/17/21 for 90-DS  Patient's preferred pharmacy is:  [facility] - [place], [address] [place] [address] Phone: [phone] Fax: [phone]  [facility] [address], [place] [facility] [address] AT [address] [facility] [place] [address] Phone: [phone] Fax: [place], [address] AT [address] [place] [address] Phone: [phone] Fax: [phone]   Uses pill box? Yes Pt endorses 100% compliance  We discussed: Current pharmacy is preferred with insurance plan and patient is satisfied with pharmacy services Patient decided to: Continue current medication management strategy  Care Plan and Follow Up Patient Decision:  Patient agrees to Care Plan and Follow-up.  Plan: Telephone follow up appointment with care management team member scheduled for:  01/07/2022 at 8:30 AM  Junius Argyle, PharmD, BCACP, [place] [phone]  Current Barriers:  No barriers noted  Pharmacist Clinical Goal(s):  Patient will maintain control of diabetes as evidenced by A1c less than 8%  maintain control of heart failure as evidenced by stable weight, lack of exacerbations through collaboration with PharmD and provider.   Interventions: 1:1 collaboration with Birdie Sons, MD regarding development and update of comprehensive plan of care as evidenced by provider attestation and co-signature Inter-disciplinary care team collaboration (see longitudinal plan of care) Comprehensive  medication review performed; medication list updated in electronic medical record  Heart Failure (Goal: manage symptoms and prevent exacerbations) -Controlled -Last ejection fraction: 35% (Date: Mar 2017) -HF type: Systolic -NYHA Class: II (slight limitation of activity) -AHA HF Stage: C (Heart disease  and symptoms present) -Current treatment: Carvedilol 3.125 mg twice daily  Furosemide 40 mg 1/2 tablet daily  Entresto 24-26 1/2 tablet twice daily  -Medications previously tried: NA  -114s,  -Educated on Importance of weighing daily; if you gain more than 3 pounds in one day or 5 pounds in one week, contact cardiology office  -Recommended to continue current medication  Hyperlipidemia: (LDL goal < 100) -Controlled -Current treatment: Atorvastatin 20 mg daily  -Medications previously tried: NA  -Recommended to continue current medication  Diabetes (A1c goal <8%) -Controlled -Current medications: Farxiga 5 mg daily  Janumet [phone] mg daily  -Medications previously tried: NA  -Current home glucose readings: 104, 97, 90, 100, 87, 102, 95, 99    -Denies hypoglycemic/hyperglycemic symptoms -Current meal patterns: Trying to minimize salt intake, carbohydrate intake  -Current exercise: Trying to get back into walking with sister -Recommended to continue current medication  Osteopenia (Goal Prevent bone fractures) -Controlled -Patient is not a candidate for pharmacologic treatment -Current treatment  None -Medications previously tried: NA  -Recommend [phone] units of vitamin D daily. -Recommended to continue current medication  Hypothyroidism (Goal: Maintain stable thyroid function) -Controlled -Current treatment  Synthroid 75 mcg daily  -Medications previously tried: NA  -Recommended to continue current medication  GERD (Goal: Prevent heartburn) -Controlled -Current treatment  Pantoprazole 40 mg daily -Medications previously tried: NA  -Recommended to continue  current medication   Patient Goals/Self-Care Activities Patient will:  - check glucose daily before breakfast, document, and provide at future appointments check blood pressure weekly, document, and provide at future appointments weigh daily, and contact provider if weight gain of greater than 2 pounds in 24 hours   Follow Up Plan: ***

## 2022-01-11 ENCOUNTER — Ambulatory Visit (INDEPENDENT_AMBULATORY_CARE_PROVIDER_SITE_OTHER): Payer: Medicare HMO | Admitting: Internal Medicine

## 2022-01-11 ENCOUNTER — Encounter: Payer: Self-pay | Admitting: Internal Medicine

## 2022-01-11 ENCOUNTER — Other Ambulatory Visit: Payer: Self-pay

## 2022-01-11 VITALS — BP 116/74 | HR 78 | Ht 61.0 in | Wt 114.0 lb

## 2022-01-11 DIAGNOSIS — I428 Other cardiomyopathies: Secondary | ICD-10-CM | POA: Diagnosis not present

## 2022-01-11 DIAGNOSIS — Z95810 Presence of automatic (implantable) cardiac defibrillator: Secondary | ICD-10-CM

## 2022-01-11 DIAGNOSIS — I5022 Chronic systolic (congestive) heart failure: Secondary | ICD-10-CM | POA: Diagnosis not present

## 2022-01-11 DIAGNOSIS — R9431 Abnormal electrocardiogram [ECG] [EKG]: Secondary | ICD-10-CM

## 2022-01-11 DIAGNOSIS — I447 Left bundle-branch block, unspecified: Secondary | ICD-10-CM | POA: Diagnosis not present

## 2022-01-11 MED ORDER — FUROSEMIDE 40 MG PO TABS: ORAL_TABLET | ORAL | Status: DC

## 2022-01-11 NOTE — Patient Instructions (Addendum)
Medication Instructions:  ?- Your physician has recommended you make the following change in your medication:  ? ?1) CHANGE lasix (furosemide) 40 mg: ?- take 1 tablet (40 mg) by mouth once daily x 3 days, then ?- take 1 tablet (40 mg) by mouth EVERY OTHER day ? ?*If you need a refill on your cardiac medications before your next appointment, please call your pharmacy* ? ? ?Lab Work: ?- none ordered ? ?If you have labs (blood work) drawn today and your tests are completely normal, you will receive your results only by: ?MyChart Message (if you have MyChart) OR ?A paper copy in the mail ?If you have any lab test that is abnormal or we need to change your treatment, we will call you to review the results. ? ? ?Testing/Procedures: ?- none ordered ? ? ?Follow-Up: ?At [facility], you and your health needs are our priority.  As part of our continuing mission to provide you with exceptional heart care, we have created designated Provider Care Teams.  These Care Teams include your primary Cardiologist (physician) and Advanced Practice Providers (APPs -  Physician Assistants and Nurse Practitioners) who all work together to provide you with the care you need, when you need it. ? ?We recommend signing up for the patient portal called "MyChart".  Sign up information is provided on this After Visit Summary.  MyChart is used to connect with patients for Virtual Visits (Telemedicine).  Patients are able to view lab/test results, encounter notes, upcoming appointments, etc.  Non-urgent messages can be sent to your provider as well.   ?To learn more about what you can do with MyChart, go to [email or web address].   ? ?Your next appointment:   ?Pending your battery on your device needing to be replaced  ? ?The format for your next appointment:   ?In Person ? ?Provider:   ?[name], MD  ? ? ?Other Instructions ?[name] ? ?

## 2022-01-11 NOTE — Progress Notes (Signed)
? ? ?ELECTROPHYSIOLOGY Clinic  NOTE  ?Patient ID: Bethany [name], MRN: [id number], DOB/AGE: 1946/08/28 76 y.o. ?Admit date: (Not on file) ?Date of Consult: 01/11/2022 ? ?Primary Physician: Birdie Sons, MD ?Primary Cardiologist: BK  ? ?Chief Complaint: Defibrillator ? ? ?HPI ?Bethany [name] is a 76 y.o. female in follow-up for Medtronic CRT-D implantation 4/16 for nonischemic cardiomyopathy and has been approaching ERI ? ?The patient denies nocturnal dyspnea, orthopnea or peripheral edema.  There have been no palpitations, lightheadedness or syncope.  Complains of chest discomfort with which she woke up this morning.  She describes it as a soreness on her left chest with radiation into the arm is aggravated by breathing and by the turning of her head.  Not associated with exertion.  Has had worsening dyspnea over recent months and also orthopnea and nocturnal dyspnea..  ? ?DATE TEST EF   ?2012 Echo  20-25%   ?2012 LHC  Normal CAs  ?3/16 Echo   15-20 %   ?3/17 Echo  35%   ?4/21 Echo ([place]) 50%   ?4/21 Myoview   No perfusion defects  ? ?  ? ?Date  Cr K Hgb  ?5/19 .75 3.8   ? 8/21 0.84 3.8   ?6/22 0.89 4.1 12.2  ? ? ? ?Past Medical History:  ?Diagnosis Date  ? AICD (automatic cardioverter/defibrillator) present   ? Anemia   ? Anxiety   ? CHF (congestive heart failure) ([place])   ? Complication of anesthesia   ? Depression   ? Diabetes ([place])   ? Dilated cardiomyopathy ([place])   ? Diverticulosis   ? Dyspnea   ? Dysrhythmia   ? Edema   ? FEET/ANKLES OCCAS  ? GERD (gastroesophageal reflux disease)   ? Hyperlipidemia   ? Hypothyroidism   ? LV dysfunction   ? Orthopnea   ? USES 3 PILLOWS  ? PONV (postoperative nausea and vomiting)   ? Reflux   ? Thrombocytopenia ([place])   ? Thyroid disease   ? VHD (valvular heart disease)   ?   ? ?Surgical History:  ?Past Surgical History:  ?Procedure Laterality Date  ? ABDOMINAL HYSTERECTOMY    ? BI-VENTRICULAR IMPLANTABLE CARDIOVERTER DEFIBRILLATOR [name] 02/11/2015  ? Procedure: BI-VENTRICULAR  IMPLANTABLE CARDIOVERTER DEFIBRILLATOR  (CRT-D);  Surgeon: Deboraha Sprang, MD;  [facility] C[facility] LAB;  Service: Cardiovascular;  Laterality: [name];  ? BONE MARROW BIOPSY  2007  ? spine  ? BREAST BIOPSY Right   ? neg  ? CARDIAC CATHETERIZATION    ? CATARACT EXTRACTION W/PHACO Right 01/03/2017  ? Procedure: CATARACT EXTRACTION PHACO AND INTRAOCULAR LENS PLACEMENT (IOC);  Surgeon: Leandrew Koyanagi, MD;  Location: [facility] ORS;  Service: Ophthalmology;  Laterality: Right;  [place] 00:50 ?AP% 19.5 ?CDE 9.94 ?fluid pack lot # [id number]  ? CATARACT EXTRACTION W/PHACO Left 10/27/2016  ? Procedure: CATARACT EXTRACTION PHACO AND INTRAOCULAR LENS PLACEMENT (IOC);  Surgeon: Leandrew Koyanagi, MD;  Location: [facility] ORS;  Service: Ophthalmology;  Laterality: Left;  [place]  01:12 ?AP%  21.0 ?CDE  11.59 ?Fluid pack lot # [id number]  ? CHOLECYSTECTOMY    ? INSERT / REPLACE / REMOVE PACEMAKER    ? AICD  ? LEAD REVISION [name] 02/11/2015  ? Procedure: LEAD REVISION;  Surgeon: Deboraha Sprang, MD;  Location: [facility] CATH LAB;  Service: Cardiovascular;  Laterality: [name];  ? PARTIAL HYSTERECTOMY  1977  ? TOOTH EXTRACTION  09/23/2015  ?  ? ?Home Meds: ?Current Outpatient Medications on File Prior to Visit  ?Medication Sig Dispense Refill  ?  acetaminophen (TYLENOL) 500 MG tablet Take 1,000 mg by mouth 2 (two) times daily as needed for mild pain or moderate pain.    ? Alcohol Swabs (B-D SINGLE USE SWABS REGULAR) PADS USE TO CHECK BLOOD SUGAR DAILY 100 each 4  ? aspirin EC 81 MG tablet Take 81 mg by mouth daily.    ? atorvastatin (LIPITOR) 20 MG tablet TAKE 1 TABLET EVERY DAY 90 tablet 4  ? Blood Glucose Calibration (TRUE METRIX LEVEL 1) Low SOLN Use as directed with glucose meter 1 each 3  ? carvedilol (COREG) 3.125 MG tablet TAKE 1 TABLET TWICE DAILY WITH MEALS 180 tablet 4  ? cyanocobalamin 100 MCG tablet Take 100 mcg by mouth daily.    ? FARXIGA 5 MG TABS tablet TAKE 1 TABLET EVERY DAY BEFORE BREAKFAST 90 tablet 0  ? ferrous sulfate 325 (65 FE) MG tablet Take 325  mg by mouth 2 (two) times daily with a meal.    ? furosemide (LASIX) 40 MG tablet Take 0.5-1 tablets (20-40 mg total) by mouth daily.    ? glucose blood (TRUE METRIX BLOOD GLUCOSE TEST) test strip TEST BLOOD SUGAR EVERY DAY 100 strip 3  ? JANUMET XR [phone] MG TB24 TAKE 1 TABLET EVERY DAY 90 tablet 4  ? levothyroxine (SYNTHROID) 75 MCG tablet Take 1 tablet (75 mcg total) by mouth daily. 90 tablet 1  ? Multiple Vitamin (MULTIVITAMIN) tablet Take 1 tablet by mouth daily.    ? pantoprazole (PROTONIX) 40 MG tablet TAKE 1 TABLET (40 MG TOTAL) BY MOUTH DAILY. 90 tablet 4  ? sacubitril-valsartan (ENTRESTO) 24-26 MG Take 0.5 tablets by mouth 2 (two) times daily.    ? senna (SENOKOT) 8.6 MG tablet Take 1 tablet by mouth daily as needed.    ? TRUEplus Lancets 33G MISC CHECK BLOOD SUGAR ONE TIME DAILY 100 each 4  ? ?No current facility-administered medications on file prior to visit.  ? ? ? ? ?Allergies:  ?Allergies  ?Allergen Reactions  ? Other Other (See Comments)  ?  NO BLOOD PRODUCTS- JEHOVAHS WITNESS  ? Sulfa Antibiotics Other (See Comments)  ?  Loss of taste  ? ? ?  ? ?ROS:  Please see the history of present illness.     All other systems reviewed and negative.  ? ?BP 116/74 (BP Location: Left Arm, Patient Position: Sitting, Cuff Size: Normal)   Pulse 78   Ht '5\' 1"'  (1.549 m)   Wt 114 lb (51.7 kg)   LMP  (LMP Unknown)   SpO2 97%   BMI 21.54 kg/m?  ?Well developed and well nourished in no acute distress ?HENT normal ?Neck supple with JVP-6-7 ?Clear ?Device pocket well healed; without hematoma or erythema.  There is no tethering  ?Regular rate and rhythm, no   gallop No / murmur ?Abd-soft with active BS ?No Clubbing cyanosis  edema ?Skin-warm and dry ?A & Oriented  Grossly normal sensory and motor function ? ?ECG sinus with P synchronous pacing at 78 ?Intervals 14/12/43 ?QRS is negative in lead I and negative in lead V1 ? ?  ?Assessment and Plan:  ? ?NICM interval improvement ? ?CHF systolic chronic/ class 2B   ? ?LBBB ? ?CRT-D-Medtronic implanted 4/16 ? ?Dyspnea on exertion   ? ?Fatigue ? ?She is euvolemic; however, with her orthopnea, I wonder if there is insulin sided volume overload.  We will change her diuretic from 20 daily to 40 for 3 days and then 40 every other day. ? ?With her cardiomyopathy, we  will continue her on carvedilol 3.125 and her Entresto 24/26.  She remains on Farxiga 5. ? ?Her device is approaching ERI I reviewed with her the alerts ?Virl Axe  ?

## 2022-01-17 ENCOUNTER — Ambulatory Visit (INDEPENDENT_AMBULATORY_CARE_PROVIDER_SITE_OTHER): Payer: Medicare HMO

## 2022-01-17 DIAGNOSIS — I428 Other cardiomyopathies: Secondary | ICD-10-CM

## 2022-01-18 LAB — CUP PACEART REMOTE DEVICE CHECK
Battery Remaining Longevity: 2 mo
Battery Voltage: 2.74 V
Brady Statistic AP VP Percent: 0 %
Brady Statistic AP VS Percent: 0 %
Brady Statistic AS VP Percent: 98.82 %
Brady Statistic AS VS Percent: 1.17 %
Brady Statistic RA Percent Paced: 0 %
Brady Statistic RV Percent Paced: 7.19 %
Date Time Interrogation Session: 20230313012402
HighPow Impedance: 51 Ohm
Implantable Lead Implant Date: 20160409
Implantable Lead Implant Date: 20160409
Implantable Lead Implant Date: 20160409
Implantable Lead Location: 753858
Implantable Lead Location: 753859
Implantable Lead Location: 753860
Implantable Lead Model: 4598
Implantable Lead Model: 5076
Implantable Pulse Generator Implant Date: 20160409
Lead Channel Impedance Value: 1026 Ohm
Lead Channel Impedance Value: 1083 Ohm
Lead Channel Impedance Value: 1140 Ohm
Lead Channel Impedance Value: 1292 Ohm
Lead Channel Impedance Value: 1406 Ohm
Lead Channel Impedance Value: 1406 Ohm
Lead Channel Impedance Value: 342 Ohm
Lead Channel Impedance Value: 418 Ohm
Lead Channel Impedance Value: 475 Ohm
Lead Channel Impedance Value: 532 Ohm
Lead Channel Impedance Value: 665 Ohm
Lead Channel Impedance Value: 722 Ohm
Lead Channel Impedance Value: 874 Ohm
Lead Channel Pacing Threshold Amplitude: 0.375 V
Lead Channel Pacing Threshold Amplitude: 0.875 V
Lead Channel Pacing Threshold Amplitude: 2.125 V
Lead Channel Pacing Threshold Pulse Width: 0.4 ms
Lead Channel Pacing Threshold Pulse Width: 0.4 ms
Lead Channel Pacing Threshold Pulse Width: 0.8 ms
Lead Channel Sensing Intrinsic Amplitude: 0.5 mV
Lead Channel Sensing Intrinsic Amplitude: 0.5 mV
Lead Channel Sensing Intrinsic Amplitude: 8.75 mV
Lead Channel Sensing Intrinsic Amplitude: 8.75 mV
Lead Channel Setting Pacing Amplitude: 1.5 V
Lead Channel Setting Pacing Amplitude: 2 V
Lead Channel Setting Pacing Amplitude: 2.5 V
Lead Channel Setting Pacing Pulse Width: 0.4 ms
Lead Channel Setting Pacing Pulse Width: 1 ms
Lead Channel Setting Sensing Sensitivity: 0.45 mV

## 2022-01-18 NOTE — Patient Instructions (Signed)
Visit Information ?It was great speaking with you today!  Please let me know if you have any questions about our visit. ? ? Goals Addressed   ? ?  ?  ?  ?  ? This Visit's Progress  ?  Track and Manage Fluids and Swelling-Heart Failure   On track  ?  Timeframe:  Long-Range Goal ?Priority:  High ?Start Date: 07/02/2021                             ?Expected End Date: 07/02/2022                     ? ?Follow Up Date 09/05/2021  ?  ?- call office if I gain more than 2 pounds in one day or 5 pounds in one week ?- keep legs up while sitting ?- use salt in moderation ?- weigh myself daily  ?  ?Why is this important?   ?It is important to check your weight daily and watch how much salt and liquids you have.  ?It will help you to manage your heart failure.  ?  ?Notes:  ?  ? ?  ? ? ?Patient Care Plan: General Pharmacy (Adult)  ?  ? ?Problem Identified: Hypertension, Hyperlipidemia, Diabetes, Heart Failure, GERD, Hypothyroidism, and Osteoporosis   ?Priority: High  ?  ? ?Long-Range Goal: Patient-Specific Goal   ?Start Date: 07/02/2021  ?Expected End Date: 07/02/2022  ?This Visit's Progress: On track  ?Recent Progress: On track  ?Priority: High  ?Note:   ?Current Barriers:  ?No barriers noted ? ?Pharmacist Clinical Goal(s):  ?Patient will maintain control of diabetes as evidenced by A1c less than 8%  ?maintain control of heart failure as evidenced by stable weight, lack of exacerbations through collaboration with PharmD and provider.  ? ?Interventions: ?1:1 collaboration with Birdie Sons, MD regarding development and update of comprehensive plan of care as evidenced by provider attestation and co-signature ?Inter-disciplinary care team collaboration (see longitudinal plan of care) ?Comprehensive medication review performed; medication list updated in electronic medical record ? ?Heart Failure (Goal: manage symptoms and prevent exacerbations) ?-Controlled ?-Last ejection fraction: 35% (Date: Mar 2017) ?-HF type: Systolic ?-NYHA  Class: II (slight limitation of activity) ?-AHA HF Stage: C (Heart disease and symptoms present) ?-Current treatment: ?Carvedilol 3.125 mg twice daily: Appropriate, Effective, Safe, Accessible  ?Furosemide 40 mg 1/2 tablet daily: Appropriate, Effective, Safe, Accessible   ?Entresto 24-26 1/2 tablet twice daily: Appropriate, Effective, Safe, Accessible   ?-Medications previously tried: NA  ?-Current home blood pressure readings: ?0000000 systolic's ?-Educated on Importance of weighing daily; if you gain more than 3 pounds in one day or 5 pounds in one week, contact cardiology office  ?-Recommended to continue current medication ? ?Hyperlipidemia: (LDL goal < 100) ?-Controlled ?-Current treatment: ?Atorvastatin 20 mg daily  ?-Medications previously tried: NA  ?-Recommended to continue current medication ? ?Diabetes (A1c goal <8%) ?-Controlled ?-Current medications: ?Farxiga 5 mg daily: Appropriate, Effective, Safe, Accessible   ?Janumet [phone] mg daily: Appropriate, Effective, Safe, Accessible   ?-Medications previously tried: NA  ?-Current home glucose readings: 104, 97, 90, 100, 87, 102, 95, 99  ?-Denies hypoglycemic/hyperglycemic symptoms ?-Current meal patterns: Trying to minimize salt intake, carbohydrate intake  ?-Current exercise: Trying to get back into walking with sister ?-Recommended to continue current medication ? ?Osteopenia (Goal Prevent bone fractures) ?-Controlled ?-Patient is not a candidate for pharmacologic treatment ?-Current treatment  ?None ?-Medications previously tried: NA  ?-  Recommend [phone] units of vitamin D daily. ?-Recommended to continue current medication ? ?Hypothyroidism (Goal: Maintain stable thyroid function) ?-Controlled ?-Current treatment  ?Synthroid 75 mcg daily  ?-Medications previously tried: NA  ?-Recommended to continue current medication ? ?GERD (Goal: Prevent heartburn) ?-Controlled ?-Current treatment  ?Pantoprazole 40 mg daily ?-Medications previously tried: NA   ?-Recommended to continue current medication ? ?Patient Goals/Self-Care Activities ?Patient will:  ?- check glucose daily before breakfast, document, and provide at future appointments ?check blood pressure weekly, document, and provide at future appointments ?weigh daily, and contact provider if weight gain of greater than 2 pounds in 24 hours  ? ?Follow Up Plan: Telephone follow up appointment with care management team member scheduled for:  07/08/2022 at 8:30 AM ?  ? ?Patient agreed to services and verbal consent obtained.  ? ?Patient verbalizes understanding of instructions and care plan provided today and agrees to view in [place]. Active MyChart status confirmed with patient.   ? ?Junius Argyle, PharmD, BCACP, CPP  ?Clinical Pharmacist Practitioner  ?[place] ?[phone]  ?

## 2022-01-19 NOTE — Telephone Encounter (Signed)
Encounter created in err  

## 2022-01-26 ENCOUNTER — Encounter: Payer: Self-pay | Admitting: Family Medicine

## 2022-01-26 ENCOUNTER — Ambulatory Visit (INDEPENDENT_AMBULATORY_CARE_PROVIDER_SITE_OTHER): Payer: Medicare HMO | Admitting: Family Medicine

## 2022-01-26 ENCOUNTER — Other Ambulatory Visit: Payer: Self-pay

## 2022-01-26 VITALS — BP 101/62 | HR 91 | Temp 97.5°F | Resp 12 | Wt 112.0 lb

## 2022-01-26 DIAGNOSIS — E1169 Type 2 diabetes mellitus with other specified complication: Secondary | ICD-10-CM | POA: Diagnosis not present

## 2022-01-26 DIAGNOSIS — E039 Hypothyroidism, unspecified: Secondary | ICD-10-CM

## 2022-01-26 DIAGNOSIS — E1165 Type 2 diabetes mellitus with hyperglycemia: Secondary | ICD-10-CM | POA: Diagnosis not present

## 2022-01-26 DIAGNOSIS — E559 Vitamin D deficiency, unspecified: Secondary | ICD-10-CM

## 2022-01-26 NOTE — Progress Notes (Signed)
?  ? ?I,Roshena L Chambers,acting as a scribe for Lelon Huh, MD.,have documented all relevant documentation on the behalf of Lelon Huh, MD,as directed by  Lelon Huh, MD while in the presence of Lelon Huh, MD.  ? ? ?Established patient visit ? ? ?Patient: Bethany [name]   DOB: 1946/06/06   76 y.o. Female  MRN: [id number] ?Visit Date: 01/26/2022 ? ?Today's healthcare provider: Lelon Huh, MD  ? ?Chief Complaint  ?Patient presents with  ?? Diabetes  ?? Hypothyroidism  ? ?Subjective  ?  ?HPI  ?Diabetes Mellitus Type II, Follow-up ? ?Lab Results  ?Component Value Date  ? HGBA1C 7.0 (A) 09/24/2021  ? HGBA1C 6.8 (A) 05/24/2021  ? HGBA1C 6.9 (A) 01/12/2021  ? ?Wt Readings from Last 3 Encounters:  ?01/26/22 112 lb (50.8 kg)  ?01/11/22 114 lb (51.7 kg)  ?11/29/21 112 lb 4.8 oz (50.9 kg)  ? ?Last seen for diabetes 4 months ago.  ?Management since then includes continue same medication. ?She reports good compliance with treatment. ?She is not having side effects.  ?Symptoms: ?Yes fatigue No foot ulcerations  ?No appetite changes No nausea  ?No paresthesia of the feet  No polydipsia  ?Yes polyuria No visual disturbances   ?No vomiting   ? ? ?Home blood sugar records: fasting range: 112-115 ? ?Episodes of hypoglycemia? No  ?  ?Current insulin regiment: none ?Most Recent Eye Exam: >1 year ago ?Current exercise: walking ?Current diet habits: in general, an "unhealthy" diet ? ?Pertinent Labs: ?Lab Results  ?Component Value Date  ? CHOL 120 01/13/2021  ? HDL 55 01/13/2021  ? LDLCALC 49 01/13/2021  ? TRIG 79 01/13/2021  ? CHOLHDL 2.2 01/13/2021  ? Lab Results  ?Component Value Date  ? NA 142 09/27/2021  ? K 4.5 09/27/2021  ? CREATININE 0.81 09/27/2021  ? EGFR 76 09/27/2021  ? MICROALBUR negative 06/02/2020  ?  ? ?---------------------------------------------------------------------------------------------------  ?Hypothyroid, follow-up ? ?Lab Results  ?Component Value Date  ? TSH 5.140 (H) 09/27/2021  ? TSH 2.850  01/13/2021  ? TSH 2.920 06/02/2020  ? FREET4 1.37 09/27/2021  ? FREET4 1.28 01/13/2021  ? T4TOTAL 10.1 05/12/2017  ? ? ?Wt Readings from Last 3 Encounters:  ?01/26/22 112 lb (50.8 kg)  ?01/11/22 114 lb (51.7 kg)  ?11/29/21 112 lb 4.8 oz (50.9 kg)  ? ? ?She was last seen for hypothyroid 4 months ago.  ?Management since that visit includes increasing levothyroxine to 93mg once a day. ?She reports good compliance with treatment. ?She is not having side effects.  ? ?Symptoms: ?Yes change in energy level No constipation  ?No diarrhea No heat / cold intolerance  ?No nervousness No palpitations  ?No weight changes   ? ?-----------------------------------------------------------------------------------------  ? ?Medications: ?Outpatient Medications Prior to Visit  ?Medication Sig  ?? acetaminophen (TYLENOL) 500 MG tablet Take 1,000 mg by mouth 2 (two) times daily as needed for mild pain or moderate pain.  ?? Alcohol Swabs (B-D SINGLE USE SWABS REGULAR) PADS USE TO CHECK BLOOD SUGAR DAILY  ?? aspirin EC 81 MG tablet Take 81 mg by mouth daily.  ?? atorvastatin (LIPITOR) 20 MG tablet TAKE 1 TABLET EVERY DAY  ?? Blood Glucose Calibration (TRUE METRIX LEVEL 1) Low SOLN Use as directed with glucose meter  ?? carvedilol (COREG) 3.125 MG tablet TAKE 1 TABLET TWICE DAILY WITH MEALS  ?? cyanocobalamin 100 MCG tablet Take 100 mcg by mouth daily.  ?? FARXIGA 5 MG TABS tablet TAKE 1 TABLET EVERY DAY BEFORE BREAKFAST  ??  ferrous sulfate 325 (65 FE) MG tablet Take 325 mg by mouth 2 (two) times daily with a meal.  ?? furosemide (LASIX) 40 MG tablet Take 1 tablet (40 mg) by mouth every other day  ?? glucose blood (TRUE METRIX BLOOD GLUCOSE TEST) test strip TEST BLOOD SUGAR EVERY DAY  ?? JANUMET XR [phone] MG TB24 TAKE 1 TABLET EVERY DAY  ?? levothyroxine (SYNTHROID) 75 MCG tablet Take 1 tablet (75 mcg total) by mouth daily.  ?? Multiple Vitamin (MULTIVITAMIN) tablet Take 1 tablet by mouth daily.  ?? pantoprazole (PROTONIX) 40 MG tablet  TAKE 1 TABLET (40 MG TOTAL) BY MOUTH DAILY.  ?? sacubitril-valsartan (ENTRESTO) 24-26 MG Take 0.5 tablets by mouth 2 (two) times daily.  ?? senna (SENOKOT) 8.6 MG tablet Take 1 tablet by mouth daily as needed.  ?? TRUEplus Lancets 33G MISC CHECK BLOOD SUGAR ONE TIME DAILY  ? ?No facility-administered medications prior to visit.  ? ? ?Review of Systems  ?Constitutional:  Positive for fatigue. Negative for appetite change, chills and fever.  ?Respiratory:  Negative for chest tightness and shortness of breath.   ?Cardiovascular:  Negative for chest pain and palpitations.  ?Gastrointestinal:  Negative for abdominal pain, nausea and vomiting.  ?Skin:   ?     Itchy skin on back  ?Neurological:  Negative for dizziness and weakness.  ? ? ?  Objective  ?  ?BP 101/62 (BP Location: Left Arm, Patient Position: Sitting, Cuff Size: Normal)   Pulse 91   Temp (!) 97.5 ?F (36.4 ?C) (Oral)   Resp 12   Wt 112 lb (50.8 kg)   LMP  (LMP Unknown)   SpO2 100% Comment: room air  BMI 21.16 kg/m?  ? ? ?Physical Exam  ? ?General: Appearance:    Well developed, well nourished female in no acute distress  ?Eyes:    PERRL, conjunctiva/corneas clear, EOM's intact       ?Lungs:     Clear to auscultation bilaterally, respirations unlabored  ?Heart:    Normal heart rate. Normal rhythm.  ?2/6 systolic murmur at right upper sternal border   ?MS:   All extremities are intact.    ?Neurologic:   Awake, alert, oriented x 3. No apparent focal neurological defect.   ?   ?  ? Assessment & Plan  ?  ? ?1. Adult hypothyroidism ?Generally tolerating increased dose of levothyroxine, although she still feels fatigued and weight is down 2 pounds.  ?- TSH ? ?2. Type 2 diabetes mellitus with other specified complication, without long-term current use of insulin ([place]) ? ?- Urine Albumin-Creatinine with uACR ?- Hemoglobin A1c ?- Comprehensive metabolic panel ? ?3. Vitamin D deficiency ? ?- VITAMIN D 25 Hydroxy (Vit-D Deficiency, Fractures)   ?   ? ?The entirety  of the information documented in the History of Present Illness, Review of Systems and Physical Exam were personally obtained by me. Portions of this information were initially documented by the CMA and reviewed by me for thoroughness and accuracy.   ? ? ?Lelon Huh, MD  ?[facility] ?[phone] (phone) ?[phone] (fax) ? ?[facility]  ?

## 2022-01-27 LAB — COMPREHENSIVE METABOLIC PANEL
ALT: 11 IU/L (ref 0–32)
AST: 18 IU/L (ref 0–40)
Albumin/Globulin Ratio: 1.8 (ref 1.2–2.2)
Albumin: 4.2 g/dL (ref 3.7–4.7)
Alkaline Phosphatase: 104 IU/L (ref 44–121)
BUN/Creatinine Ratio: 14 (ref 12–28)
BUN: 11 mg/dL (ref 8–27)
Bilirubin Total: 0.3 mg/dL (ref 0.0–1.2)
CO2: 25 mmol/L (ref 20–29)
Calcium: 9.7 mg/dL (ref 8.7–10.3)
Chloride: 102 mmol/L (ref 96–106)
Creatinine, Ser: 0.79 mg/dL (ref 0.57–1.00)
Globulin, Total: 2.4 g/dL (ref 1.5–4.5)
Glucose: 184 mg/dL — ABNORMAL HIGH (ref 70–99)
Potassium: 3.9 mmol/L (ref 3.5–5.2)
Sodium: 142 mmol/L (ref 134–144)
Total Protein: 6.6 g/dL (ref 6.0–8.5)
eGFR: 78 mL/min/{1.73_m2} (ref >59)

## 2022-01-27 LAB — MICROALBUMIN / CREATININE URINE RATIO
Creatinine, Urine: 10 mg/dL
Microalb/Creat Ratio: <30 mg/g creat (ref 0–29)
Microalbumin, Urine: <3 ug/mL

## 2022-01-27 LAB — HEMOGLOBIN A1C
Est. average glucose Bld gHb Est-mCnc: 151 mg/dL
Hgb A1c MFr Bld: 6.9 % — ABNORMAL HIGH (ref 4.8–5.6)

## 2022-01-27 LAB — VITAMIN D 25 HYDROXY (VIT D DEFICIENCY, FRACTURES): Vit D, 25-Hydroxy: 50.6 ng/mL (ref 30.0–100.0)

## 2022-01-27 LAB — TSH: TSH: 0.019 u[IU]/mL — ABNORMAL LOW (ref 0.450–4.500)

## 2022-01-31 NOTE — Progress Notes (Signed)
Remote ICD transmission.   

## 2022-01-31 NOTE — Addendum Note (Signed)
Addended by: Elease Etienne A on: 01/31/2022 12:17 PM ? ? Modules accepted: Level of Service ? ?

## 2022-02-04 DIAGNOSIS — I5020 Unspecified systolic (congestive) heart failure: Secondary | ICD-10-CM | POA: Diagnosis not present

## 2022-02-04 DIAGNOSIS — M8580 Other specified disorders of bone density and structure, unspecified site: Secondary | ICD-10-CM

## 2022-02-04 DIAGNOSIS — E039 Hypothyroidism, unspecified: Secondary | ICD-10-CM | POA: Diagnosis not present

## 2022-02-04 DIAGNOSIS — E1159 Type 2 diabetes mellitus with other circulatory complications: Secondary | ICD-10-CM | POA: Diagnosis not present

## 2022-02-04 DIAGNOSIS — E785 Hyperlipidemia, unspecified: Secondary | ICD-10-CM | POA: Diagnosis not present

## 2022-02-16 ENCOUNTER — Telehealth: Payer: Self-pay

## 2022-02-16 NOTE — Progress Notes (Signed)
? ? ?Chronic Care Management ?Pharmacy Assistant  ? ?Name: Bethany [name]  MRN: [id number] DOB: [birth year] ? ?Reason for Encounter: Diabetes Disease State Call ?  ?Recent office visits:  ?01/26/2022 [name], MD (PCP Office Visit) for Follow-up- No medication changes noted, Lab orders placed, No follow-up noted ? ?Recent consult visits:  ?01/11/2022 [name], MD (Cardiology) for Follow-up - Changed: Furosemide 40 mg 1 tablet every other day, Stopped: Meloxicam 7.5 mg due to a change in therapy, EKG-12 lead order placed, Pacemaker External order placed, No follow-up noted ? ?Hospital visits:  ?None in previous 6 months ? ?Medications: ?Outpatient Encounter Medications as of 02/16/2022  ?Medication Sig  ? acetaminophen (TYLENOL) 500 MG tablet Take 1,000 mg by mouth 2 (two) times daily as needed for mild pain or moderate pain.  ? Alcohol Swabs (B-D SINGLE USE SWABS REGULAR) PADS USE TO CHECK BLOOD SUGAR DAILY  ? aspirin EC 81 MG tablet Take 81 mg by mouth daily.  ? atorvastatin (LIPITOR) 20 MG tablet TAKE 1 TABLET EVERY DAY  ? Blood Glucose Calibration (TRUE METRIX LEVEL 1) Low SOLN Use as directed with glucose meter  ? carvedilol (COREG) 3.125 MG tablet TAKE 1 TABLET TWICE DAILY WITH MEALS  ? cyanocobalamin 100 MCG tablet Take 100 mcg by mouth daily.  ? FARXIGA 5 MG TABS tablet TAKE 1 TABLET EVERY DAY BEFORE BREAKFAST  ? ferrous sulfate 325 (65 FE) MG tablet Take 325 mg by mouth 2 (two) times daily with a meal.  ? furosemide (LASIX) 40 MG tablet Take 1 tablet (40 mg) by mouth every other day  ? glucose blood (TRUE METRIX BLOOD GLUCOSE TEST) test strip TEST BLOOD SUGAR EVERY DAY  ? JANUMET XR [phone] MG TB24 TAKE 1 TABLET EVERY DAY  ? levothyroxine (SYNTHROID) 75 MCG tablet Take 1 tablet (75 mcg total) by mouth daily.  ? Multiple Vitamin (MULTIVITAMIN) tablet Take 1 tablet by mouth daily.  ? pantoprazole (PROTONIX) 40 MG tablet TAKE 1 TABLET (40 MG TOTAL) BY MOUTH DAILY.  ? sacubitril-valsartan (ENTRESTO) 24-26  MG Take 0.5 tablets by mouth 2 (two) times daily.  ? senna (SENOKOT) 8.6 MG tablet Take 1 tablet by mouth daily as needed.  ? TRUEplus Lancets 33G MISC CHECK BLOOD SUGAR ONE TIME DAILY  ? ?No facility-administered encounter medications on file as of 02/16/2022.  ? ?Care Gaps: ?Tetanus/TDAP ?Zoster Vaccine ?Diabetic Eye Exam ?Dexa Scan ? ?Star Rating Drugs: ?Atorvastatin 20 mg last filled on 12/29/2021 for a 90-Day supply with [facility] ?Farxiga 5 mg last filled on 12/16/2021 for a 90-Day supply with [facility] ?Janumet [phone] mg last filled on 12/18/2021 for a 90-Day supply with [facility] ? ?Recent Relevant Labs: ?Lab Results  ?Component Value Date/Time  ? HGBA1C 6.9 (H) 01/26/2022 10:11 AM  ? HGBA1C 7.0 (A) 09/24/2021 11:18 AM  ? HGBA1C 6.8 (A) 05/24/2021 03:54 PM  ? HGBA1C 8.2 (H) 04/04/2018 03:02 PM  ? HGBA1C 6.3 02/20/2014 07:57 PM  ? MICROALBUR negative 06/02/2020 10:36 AM  ? MICROALBUR negative 12/04/2018 11:50 AM  ?  ?Kidney Function ?Lab Results  ?Component Value Date/Time  ? CREATININE 0.79 01/26/2022 10:11 AM  ? CREATININE 0.81 09/27/2021 02:19 PM  ? CREATININE 0.70 02/04/2015 02:26 PM  ? CREATININE 0.89 11/10/2014 11:47 AM  ? GFR 88.96 02/05/2015 10:34 AM  ? GFRNONAA 69 07/01/2020 11:26 AM  ? GFRNONAA >60 02/04/2015 02:26 PM  ? GFRAA 79 07/01/2020 11:26 AM  ? GFRAA >60 02/04/2015 02:26 PM  ? ? ?Current antihyperglycemic  regimen:  ?Farxiga 5 mg 1 tablet daily ?Janumet XR [phone] mg 1 tablet daily ? ?What recent interventions/DTPs have been made to improve glycemic control:  ?None ID ?Have there been any recent hospitalizations or ED visits since last visit with CPP? No ?Patient denies hypoglycemic symptoms, including Pale, Sweaty, Shaky, Hungry, Nervous/irritable, and Vision changes ?Patient denies hyperglycemic symptoms, including blurry vision, excessive thirst, polyuria, and weakness ?How often are you checking your blood sugar? once daily ?What are your blood sugars ranging?   ?Fasting: 97 this morning ? ?During the week, how often does your blood glucose drop below 70? Never ?Are you checking your feet daily/regularly? Yes ? ?Adherence Review: ?Is the patient currently on a STATIN medication? Yes ?Is the patient currently on ACE/ARB medication? Yes ?Does the patient have >5 day gap between last estimated fill dates? No ? ?I spoke with the patient, and she reports that she is doing okay. Patient stated today she feels really tired, but no ill symptoms. Patient stated that since the change in her furosemide she is doing well no issues, and very little swelling.  ? ?Patient stated that she is taking all medications as directed with no issues. Patient has no other concerns or issues at this time.  ? ?Patient has a telephone appointment scheduled with Angelena Sole, CPP on 07/08/2022 @ 0830. ? ?Adelene Idler, CPA/CMA ?Clinical Pharmacist Assistant ?Phone: [phone]  ? ? ? ? ?

## 2022-02-17 ENCOUNTER — Ambulatory Visit (INDEPENDENT_AMBULATORY_CARE_PROVIDER_SITE_OTHER): Payer: Medicare HMO

## 2022-02-17 DIAGNOSIS — I428 Other cardiomyopathies: Secondary | ICD-10-CM

## 2022-02-17 LAB — CUP PACEART REMOTE DEVICE CHECK
Battery Remaining Longevity: 1 mo
Battery Voltage: 2.73 V
Brady Statistic AP VP Percent: 0 %
Brady Statistic AP VS Percent: 0 %
Brady Statistic AS VP Percent: 98.8 %
Brady Statistic AS VS Percent: 1.19 %
Brady Statistic RA Percent Paced: 0 %
Brady Statistic RV Percent Paced: 7.52 %
Date Time Interrogation Session: 20230413033323
HighPow Impedance: 50 Ohm
Implantable Lead Implant Date: 20160409
Implantable Lead Implant Date: 20160409
Implantable Lead Implant Date: 20160409
Implantable Lead Location: 753858
Implantable Lead Location: 753859
Implantable Lead Location: 753860
Implantable Lead Model: 4598
Implantable Lead Model: 5076
Implantable Pulse Generator Implant Date: 20160409
Lead Channel Impedance Value: 1026 Ohm
Lead Channel Impedance Value: 1121 Ohm
Lead Channel Impedance Value: 1254 Ohm
Lead Channel Impedance Value: 1292 Ohm
Lead Channel Impedance Value: 1349 Ohm
Lead Channel Impedance Value: 342 Ohm
Lead Channel Impedance Value: 361 Ohm
Lead Channel Impedance Value: 456 Ohm
Lead Channel Impedance Value: 513 Ohm
Lead Channel Impedance Value: 608 Ohm
Lead Channel Impedance Value: 665 Ohm
Lead Channel Impedance Value: 817 Ohm
Lead Channel Impedance Value: 988 Ohm
Lead Channel Pacing Threshold Amplitude: 0.375 V
Lead Channel Pacing Threshold Amplitude: 0.875 V
Lead Channel Pacing Threshold Amplitude: 2.125 V
Lead Channel Pacing Threshold Pulse Width: 0.4 ms
Lead Channel Pacing Threshold Pulse Width: 0.4 ms
Lead Channel Pacing Threshold Pulse Width: 0.8 ms
Lead Channel Sensing Intrinsic Amplitude: 0.5 mV
Lead Channel Sensing Intrinsic Amplitude: 0.5 mV
Lead Channel Sensing Intrinsic Amplitude: 9 mV
Lead Channel Sensing Intrinsic Amplitude: 9 mV
Lead Channel Setting Pacing Amplitude: 1.5 V
Lead Channel Setting Pacing Amplitude: 2 V
Lead Channel Setting Pacing Amplitude: 2.5 V
Lead Channel Setting Pacing Pulse Width: 0.4 ms
Lead Channel Setting Pacing Pulse Width: 1 ms
Lead Channel Setting Sensing Sensitivity: 0.45 mV

## 2022-02-23 ENCOUNTER — Telehealth: Payer: Self-pay | Admitting: Internal Medicine

## 2022-02-23 NOTE — Telephone Encounter (Signed)
?  Pt said, she was advise to call to schedule her battery on her defib device  ?

## 2022-02-24 NOTE — Telephone Encounter (Signed)
To Device clinic to confirm if the patient's battery has actually hit ERI.  ?

## 2022-02-24 NOTE — Telephone Encounter (Signed)
Spoke with patient she stated she would send in a remote transmission when she got home ?

## 2022-02-28 NOTE — Telephone Encounter (Signed)
I called and spoke with the patient. ?I advised her we will need to bring her into the office to see Dr. Graciela Husbands and update her H&P prior to her generator change out. ? ?I have offered the patient Tuesday 03/08/22, but she is unavailable on this date. ? ?I have offered her Thursday 03/10/22 at 10:20 am & she is agreeable. ? ?The patient voices understanding of this appointment date/ time.  ? ? ? ?

## 2022-02-28 NOTE — Telephone Encounter (Signed)
Device has reached RRT 02/26/2022. Patient aware and advised I will forward to covering RN. Patient states her alarm is not bothering her and we can turn off at her next OV. ? ? ?

## 2022-02-28 NOTE — Telephone Encounter (Signed)
Noted- patient will need to come into the office to discuss her battery changeout and get an updated H&P. ? ?Will forward to scheduling to please reach out to the patient to see if she can come in and see Dr. Graciela Husbands on 03/08/22. ? ? ?

## 2022-03-03 ENCOUNTER — Other Ambulatory Visit: Payer: Self-pay | Admitting: Family Medicine

## 2022-03-07 NOTE — Progress Notes (Signed)
Remote ICD transmission.   

## 2022-03-08 DIAGNOSIS — Z95810 Presence of automatic (implantable) cardiac defibrillator: Secondary | ICD-10-CM | POA: Diagnosis not present

## 2022-03-08 DIAGNOSIS — I10 Essential (primary) hypertension: Secondary | ICD-10-CM | POA: Diagnosis not present

## 2022-03-08 DIAGNOSIS — E782 Mixed hyperlipidemia: Secondary | ICD-10-CM | POA: Diagnosis not present

## 2022-03-08 DIAGNOSIS — I5022 Chronic systolic (congestive) heart failure: Secondary | ICD-10-CM | POA: Diagnosis not present

## 2022-03-08 DIAGNOSIS — I447 Left bundle-branch block, unspecified: Secondary | ICD-10-CM | POA: Diagnosis not present

## 2022-03-10 ENCOUNTER — Encounter: Payer: Self-pay | Admitting: Internal Medicine

## 2022-03-10 ENCOUNTER — Encounter: Payer: Self-pay | Admitting: Podiatry

## 2022-03-10 ENCOUNTER — Telehealth: Payer: Self-pay | Admitting: Internal Medicine

## 2022-03-10 ENCOUNTER — Encounter: Payer: Self-pay | Admitting: *Deleted

## 2022-03-10 ENCOUNTER — Ambulatory Visit: Payer: Medicare HMO | Admitting: Internal Medicine

## 2022-03-10 ENCOUNTER — Ambulatory Visit: Payer: Medicare HMO | Admitting: Podiatry

## 2022-03-10 VITALS — BP 90/60 | HR 82 | Ht 61.0 in | Wt 114.1 lb

## 2022-03-10 DIAGNOSIS — I5022 Chronic systolic (congestive) heart failure: Secondary | ICD-10-CM

## 2022-03-10 DIAGNOSIS — I447 Left bundle-branch block, unspecified: Secondary | ICD-10-CM

## 2022-03-10 DIAGNOSIS — Z01812 Encounter for preprocedural laboratory examination: Secondary | ICD-10-CM | POA: Diagnosis not present

## 2022-03-10 DIAGNOSIS — B351 Tinea unguium: Secondary | ICD-10-CM | POA: Diagnosis not present

## 2022-03-10 DIAGNOSIS — M79674 Pain in right toe(s): Secondary | ICD-10-CM | POA: Diagnosis not present

## 2022-03-10 DIAGNOSIS — E119 Type 2 diabetes mellitus without complications: Secondary | ICD-10-CM

## 2022-03-10 DIAGNOSIS — I428 Other cardiomyopathies: Secondary | ICD-10-CM | POA: Diagnosis not present

## 2022-03-10 DIAGNOSIS — Z95810 Presence of automatic (implantable) cardiac defibrillator: Secondary | ICD-10-CM | POA: Diagnosis not present

## 2022-03-10 NOTE — Patient Instructions (Signed)
Medication Instructions:  ?- Your physician recommends that you continue on your current medications as directed. Please refer to the Current Medication list given to you today. ? ?*If you need a refill on your cardiac medications before your next appointment, please call your pharmacy* ? ? ?Lab Work: ?- Your physician recommends that you return for lab work anytime between today (5/4)- Friday 03/25/22: BMP/ CBC ? ?[facility] Entrance at [facility] ?1st desk on the right to check in (REGISTRATION) ?Lab hours: Monday- Friday (7:30 am- 5:30 pm) ? ?If you have labs (blood work) drawn today and your tests are completely normal, you will receive your results only by: ?MyChart Message (if you have MyChart) OR ?A paper copy in the mail ?If you have any lab test that is abnormal or we need to change your treatment, we will call you to review the results. ? ? ?Testing/Procedures: ?- Your physician has recommended that you have a defibrillator generator (battery) change.  Please see the instruction sheet given to you today for more information. ? ? ? ?Follow-Up: ?At [facility], you and your health needs are our priority.  As part of our continuing mission to provide you with exceptional heart care, we have created designated Provider Care Teams.  These Care Teams include your primary Cardiologist (physician) and Advanced Practice Providers (APPs -  Physician Assistants and Nurse Practitioners) who all work together to provide you with the care you need, when you need it. ? ?We recommend signing up for the patient portal called "MyChart".  Sign up information is provided on this After Visit Summary.  MyChart is used to connect with patients for Virtual Visits (Telemedicine).  Patients are able to view lab/test results, encounter notes, upcoming appointments, etc.  Non-urgent messages can be sent to your provider as well.   ?To learn more about what you can do with MyChart, go to [email or web address].   ? ?Your next  appointment:   ?1) 10-14 days (from 03/29/22) with the Device Clinic nurse in the [place] office  ? ?2) 91 days (from 03/29/22) with Dr. Graciela Husbands in the [place] office ? ?The format for your next appointment:   ?In Person ? ?Provider:   ?As above   ? ? ?Other Instructions ?[name] ? ?Important Information About Sugar ? ? ? ? ? ? ?

## 2022-03-10 NOTE — Progress Notes (Signed)
This patient returns to my office for at risk foot care.  This patient requires this care by a professional since this patient will be at risk due to having type 2 diabetes and thrombocytopenia..  This patient is unable to cut nails herself since the patient cannot reach her nails.These nails are painful walking and wearing shoes.  This patient presents for at risk foot care today.  General Appearance  Alert, conversant and in no acute stress.  Vascular  Dorsalis pedis  are palpable  Bilaterally. Posterior tibial pulses are absent  B/L. Capillary return is within normal limits  bilaterally. Cold feet  Bilaterally.  Absent digital hair.  Neurologic  Senn-Weinstein monofilament wire test within normal limits  bilaterally. Muscle power within normal limits bilaterally.  Nails Thick disfigured discolored nails with subungual debris with incurvated nails both borders right hallux.. No evidence of bacterial infection or drainage bilaterally.  Orthopedic  No limitations of motion  feet .  No crepitus or effusions noted.  No bony pathology or digital deformities noted.  Skin  Thin skin  skin with no porokeratosis noted bilaterally.  No signs of infections or ulcers noted.     Onychomycosis  Pain in right toes  Pain in left toes  Consent was obtained for treatment procedures.   Mechanical debridement of nail both hallux. performed with a nail nipper.  Filed with dremel without incident.    Return office visit   3 months                   Told patient to return for periodic foot care and evaluation due to potential at risk complications.   Zoii Florer DPM  

## 2022-03-10 NOTE — Progress Notes (Signed)
  ELECTROPHYSIOLOGY Clinic  NOTE  Patient ID: Bethany [name], MRN: [id number], DOB/AGE: 76/19/1947 75 y.o. Admit date: (Not on file) Date of Consult: 03/10/2022  Primary Physician: Fisher, Donald E, MD Primary Cardiologist: BK   Chief Complaint: Defibrillator   HPI Bethany [name] is a 75 y.o. female in follow-up for Medtronic CRT-D implantation 4/16 for nonischemic cardiomyopathy and her device now has reached ERI    The patient denies chest pain, shortness of breath, nocturnal dyspnea, orthopnea or peripheral edema.  There have been no palpitations, lightheadedness or syncope.   .   DATE TEST EF   2012 Echo  20-25%   2012 LHC  Normal CAs  3/16 Echo   15-20 %   3/17 Echo  35%   4/21 Echo ([facility]) 50%   4/21 Myoview   No perfusion defects      Date  Cr K Hgb TSH  5/19 .75 3.8     8/21 0.84 3.8    6/22 0.89 4.1 12.2   3/23 0.79 3.9 12.1 (11/22) 0.019< 5.14 (11/22)     Past Medical History:  Diagnosis Date   AICD (automatic cardioverter/defibrillator) present    Anemia    Anxiety    CHF (congestive heart failure) (HCC)    Complication of anesthesia    Depression    Diabetes (HCC)    Dilated cardiomyopathy (HCC)    Diverticulosis    Dyspnea    Dysrhythmia    Edema    FEET/ANKLES OCCAS   GERD (gastroesophageal reflux disease)    Hyperlipidemia    Hypothyroidism    LV dysfunction    Orthopnea    USES 3 PILLOWS   PONV (postoperative nausea and vomiting)    Reflux    Thrombocytopenia (HCC)    Thyroid disease    VHD (valvular heart disease)       Surgical History:  Past Surgical History:  Procedure Laterality Date   ABDOMINAL HYSTERECTOMY     BI-VENTRICULAR IMPLANTABLE CARDIOVERTER DEFIBRILLATOR [name] 02/11/2015   Procedure: BI-VENTRICULAR IMPLANTABLE CARDIOVERTER DEFIBRILLATOR  (CRT-D);  Surgeon: Marveen Donlon C Crews Mccollam, MD;  Location: [facility];  Service: Cardiovascular;  Laterality: [name];   BONE MARROW BIOPSY  2007   spine   BREAST BIOPSY Right    neg   CARDIAC  CATHETERIZATION     CATARACT EXTRACTION W/PHACO Right 01/03/2017   Procedure: CATARACT EXTRACTION PHACO AND INTRAOCULAR LENS PLACEMENT (IOC);  Surgeon: Chadwick Brasington, MD;  Location: [facility] ORS;  Service: Ophthalmology;  Laterality: Right;  US 00:50 AP% 19.5 CDE 9.94 fluid pack lot # [id number]   CATARACT EXTRACTION W/PHACO Left 10/27/2016   Procedure: CATARACT EXTRACTION PHACO AND INTRAOCULAR LENS PLACEMENT (IOC);  Surgeon: Brasington, Chadwick, MD;  Location: [facility] ORS;  Service: Ophthalmology;  Laterality: Left;  US  01:12 AP%  21.0 CDE  11.59 Fluid pack lot # [id number]   CHOLECYSTECTOMY     INSERT / REPLACE / REMOVE PACEMAKER     AICD   LEAD REVISION [name] 02/11/2015   Procedure: LEAD REVISION;  Surgeon: Pansy Ostrovsky C Marquasia Schmieder, MD;  Location: [facility];  Service: Cardiovascular;  Laterality: [name];   PARTIAL HYSTERECTOMY  1977   TOOTH EXTRACTION  09/23/2015     Home Meds: Current Outpatient Medications on File Prior to Visit  Medication Sig Dispense Refill   acetaminophen (TYLENOL) 500 MG tablet Take 1,000 mg by mouth 2 (two) times daily as needed for mild pain or moderate pain.     Alcohol Swabs (B-D SINGLE USE   SWABS REGULAR) PADS USE TO CHECK BLOOD SUGAR DAILY 100 each 4   aspirin EC 81 MG tablet Take 81 mg by mouth daily.     atorvastatin (LIPITOR) 20 MG tablet TAKE 1 TABLET EVERY DAY 90 tablet 4   Blood Glucose Calibration (TRUE METRIX LEVEL 1) Low SOLN Use as directed with glucose meter 1 each 3   carvedilol (COREG) 3.125 MG tablet TAKE 1 TABLET TWICE DAILY WITH MEALS 180 tablet 4   cyanocobalamin 100 MCG tablet Take 100 mcg by mouth daily.     FARXIGA 5 MG TABS tablet TAKE 1 TABLET EVERY DAY BEFORE BREAKFAST 90 tablet 0   ferrous sulfate 325 (65 FE) MG tablet Take 325 mg by mouth 2 (two) times daily with a meal.     furosemide (LASIX) 40 MG tablet Take 1 tablet (40 mg) by mouth every other day 30 tablet    glucose blood (TRUE METRIX BLOOD GLUCOSE TEST) test strip TEST BLOOD SUGAR EVERY  DAY 100 strip 3   JANUMET XR 100-1000 MG TB24 TAKE 1 TABLET EVERY DAY 90 tablet 4   Multiple Vitamin (MULTIVITAMIN) tablet Take 1 tablet by mouth daily.     pantoprazole (PROTONIX) 40 MG tablet TAKE 1 TABLET (40 MG TOTAL) BY MOUTH DAILY. 90 tablet 4   sacubitril-valsartan (ENTRESTO) 24-26 MG Take 0.5 tablets by mouth 2 (two) times daily.     senna (SENOKOT) 8.6 MG tablet Take 1 tablet by mouth daily as needed.     SYNTHROID 75 MCG tablet TAKE 1 TABLET EVERY DAY 90 tablet 1   TRUEplus Lancets 33G MISC CHECK BLOOD SUGAR ONE TIME DAILY 100 each 4   No current facility-administered medications on file prior to visit.      Allergies:  Allergies  Allergen Reactions   Other Other (See Comments)    NO BLOOD PRODUCTS- JEHOVAHS WITNESS   Sulfa Antibiotics Other (See Comments)    Loss of taste       ROS:  Please see the history of present illness.     All other systems reviewed and negative.   BP 90/60 (BP Location: Left Arm, Patient Position: Sitting, Cuff Size: Normal)   Pulse 82   Ht 5' 1" (1.549 m)   Wt 114 lb 2 oz (51.8 kg)   LMP  (LMP Unknown)   SpO2 96%   BMI 21.56 kg/m  Well developed and well nourished in no acute distress HENT normal Neck supple with JVP-flat Clear Device pocket well healed; without hematoma or erythema.  There is no tethering  Regular rate and rhythm, no murmur Abd-soft with active BS No Clubbing cyanosis  edema Skin-warm and dry A & Oriented  Grossly normal sensory and motor function      Assessment and Plan:   NICM interval improvement  CHF systolic chronic/ class 2B   LBBB  CRT-D-Medtronic implanted 4/16  Dyspnea on exertion    Fatigue  Iatrogenic hyperthyroidism    Her device has reached ERI   We have reviewed the benefits and risks of generator replacement.  These include but are not limited to lead fracture and infection.  The patient understands, agrees and is willing to proceed.    She is improved on her diuretic regime of  furosemide 40 every other day, we will continue  I failed to ask her this in the office and I called her; she reports that her levothyroxine dose was not decreased following the last assessment in March where the TSH was now 0.019.    We will check it with her preop blood work.  She is approved on her SheIatrogenic hyperthyroidism is euvolemic; however, with her orthopnea, I wonder if there is insulin sided volume overload.  We will change her diuretic from 20 daily to 40 for 3 days and then 40 every other day.  With her cardiomyopathy, we will continue her on carvedilol 3.125 and her Entresto 24/26.  She remains on Farxiga 5.  Her device is approaching ERI I reviewed with her the alerts Kasondra Junod  

## 2022-03-10 NOTE — H&P (View-Only) (Signed)
ELECTROPHYSIOLOGY Clinic  NOTE  Patient ID: Bethany [name], MRN: [id number], DOB/AGE: August 29, 1946 76 y.o. Admit date: (Not on file) Date of Consult: 03/10/2022  Primary Physician: Birdie Sons, MD Primary Cardiologist: BK   Chief Complaint: Defibrillator   HPI Bethany [name] is a 76 y.o. female in follow-up for Medtronic CRT-D implantation 4/16 for nonischemic cardiomyopathy and her device now has reached ERI    The patient denies chest pain, shortness of breath, nocturnal dyspnea, orthopnea or peripheral edema.  There have been no palpitations, lightheadedness or syncope.   Marland Kitchen   DATE TEST EF   2012 Echo  20-25%   2012 LHC  Normal CAs  3/16 Echo   15-20 %   3/17 Echo  35%   4/21 Echo ([place]) 50%   4/21 Myoview   No perfusion defects      Date  Cr K Hgb TSH  5/19 .75 3.8     8/21 0.84 3.8    6/22 0.89 4.1 12.2   3/23 0.79 3.9 12.1 (11/22) 0.019< 5.14 (11/22)     Past Medical History:  Diagnosis Date   AICD (automatic cardioverter/defibrillator) present    Anemia    Anxiety    CHF (congestive heart failure) (HCC)    Complication of anesthesia    Depression    Diabetes ([place])    Dilated cardiomyopathy (HCC)    Diverticulosis    Dyspnea    Dysrhythmia    Edema    FEET/ANKLES OCCAS   GERD (gastroesophageal reflux disease)    Hyperlipidemia    Hypothyroidism    LV dysfunction    Orthopnea    USES 3 PILLOWS   PONV (postoperative nausea and vomiting)    Reflux    Thrombocytopenia (HCC)    Thyroid disease    VHD (valvular heart disease)       Surgical History:  Past Surgical History:  Procedure Laterality Date   ABDOMINAL HYSTERECTOMY     BI-VENTRICULAR IMPLANTABLE CARDIOVERTER DEFIBRILLATOR [name] 02/11/2015   Procedure: BI-VENTRICULAR IMPLANTABLE CARDIOVERTER DEFIBRILLATOR  (CRT-D);  Surgeon: Deboraha Sprang, MD;  Location: [facility] CATH LAB;  Service: Cardiovascular;  Laterality: [name];   BONE MARROW BIOPSY  2007   spine   BREAST BIOPSY Right    neg   CARDIAC  CATHETERIZATION     CATARACT EXTRACTION W/PHACO Right 01/03/2017   Procedure: CATARACT EXTRACTION PHACO AND INTRAOCULAR LENS PLACEMENT (IOC);  Surgeon: Leandrew Koyanagi, MD;  Location: [facility] ORS;  Service: Ophthalmology;  Laterality: Right;  [place] 00:50 AP% 19.5 CDE 9.94 fluid pack lot # [id number]   CATARACT EXTRACTION W/PHACO Left 10/27/2016   Procedure: CATARACT EXTRACTION PHACO AND INTRAOCULAR LENS PLACEMENT (IOC);  Surgeon: Leandrew Koyanagi, MD;  Location: [facility] ORS;  Service: Ophthalmology;  Laterality: Left;  [place]  01:12 AP%  21.0 CDE  11.59 Fluid pack lot # [id number]   CHOLECYSTECTOMY     INSERT / REPLACE / REMOVE PACEMAKER     AICD   LEAD REVISION [name] 02/11/2015   Procedure: LEAD REVISION;  Surgeon: Deboraha Sprang, MD;  Location: [facility] CATH LAB;  Service: Cardiovascular;  Laterality: [name];   PARTIAL HYSTERECTOMY  1977   TOOTH EXTRACTION  09/23/2015     Home Meds: Current Outpatient Medications on File Prior to Visit  Medication Sig Dispense Refill   acetaminophen (TYLENOL) 500 MG tablet Take 1,000 mg by mouth 2 (two) times daily as needed for mild pain or moderate pain.     Alcohol Swabs (B-D SINGLE USE  SWABS REGULAR) PADS USE TO CHECK BLOOD SUGAR DAILY 100 each 4   aspirin EC 81 MG tablet Take 81 mg by mouth daily.     atorvastatin (LIPITOR) 20 MG tablet TAKE 1 TABLET EVERY DAY 90 tablet 4   Blood Glucose Calibration (TRUE METRIX LEVEL 1) Low SOLN Use as directed with glucose meter 1 each 3   carvedilol (COREG) 3.125 MG tablet TAKE 1 TABLET TWICE DAILY WITH MEALS 180 tablet 4   cyanocobalamin 100 MCG tablet Take 100 mcg by mouth daily.     FARXIGA 5 MG TABS tablet TAKE 1 TABLET EVERY DAY BEFORE BREAKFAST 90 tablet 0   ferrous sulfate 325 (65 FE) MG tablet Take 325 mg by mouth 2 (two) times daily with a meal.     furosemide (LASIX) 40 MG tablet Take 1 tablet (40 mg) by mouth every other day 30 tablet    glucose blood (TRUE METRIX BLOOD GLUCOSE TEST) test strip TEST BLOOD SUGAR EVERY  DAY 100 strip 3   JANUMET XR [phone] MG TB24 TAKE 1 TABLET EVERY DAY 90 tablet 4   Multiple Vitamin (MULTIVITAMIN) tablet Take 1 tablet by mouth daily.     pantoprazole (PROTONIX) 40 MG tablet TAKE 1 TABLET (40 MG TOTAL) BY MOUTH DAILY. 90 tablet 4   sacubitril-valsartan (ENTRESTO) 24-26 MG Take 0.5 tablets by mouth 2 (two) times daily.     senna (SENOKOT) 8.6 MG tablet Take 1 tablet by mouth daily as needed.     SYNTHROID 75 MCG tablet TAKE 1 TABLET EVERY DAY 90 tablet 1   TRUEplus Lancets 33G MISC CHECK BLOOD SUGAR ONE TIME DAILY 100 each 4   No current facility-administered medications on file prior to visit.      Allergies:  Allergies  Allergen Reactions   Other Other (See Comments)    NO BLOOD PRODUCTS- JEHOVAHS WITNESS   Sulfa Antibiotics Other (See Comments)    Loss of taste       ROS:  Please see the history of present illness.     All other systems reviewed and negative.   BP 90/60 (BP Location: Left Arm, Patient Position: Sitting, Cuff Size: Normal)   Pulse 82   Ht _0  (1.549 m)   Wt 114 lb 2 oz (51.8 kg)   LMP  (LMP Unknown)   SpO2 96%   BMI 21.56 kg/m  Well developed and well nourished in no acute distress HENT normal Neck supple with JVP-flat Clear Device pocket well healed; without hematoma or erythema.  There is no tethering  Regular rate and rhythm, no murmur Abd-soft with active BS No Clubbing cyanosis  edema Skin-warm and dry A & Oriented  Grossly normal sensory and motor function      Assessment and Plan:   NICM interval improvement  CHF systolic chronic/ class 2B   LBBB  CRT-D-Medtronic implanted 4/16  Dyspnea on exertion    Fatigue  Iatrogenic hyperthyroidism    Her device has reached ERI   We have reviewed the benefits and risks of generator replacement.  These include but are not limited to lead fracture and infection.  The patient understands, agrees and is willing to proceed.    She is improved on her diuretic regime of  furosemide 40 every other day, we will continue  I failed to ask her this in the office and I called her; she reports that her levothyroxine dose was not decreased following the last assessment in March where the TSH was now 0.019.  We will check it with her preop blood work.  She is approved on her SheIatrogenic hyperthyroidism is euvolemic; however, with her orthopnea, I wonder if there is insulin sided volume overload.  We will change her diuretic from 20 daily to 40 for 3 days and then 40 every other day.  With her cardiomyopathy, we will continue her on carvedilol 3.125 and her Entresto 24/26.  She remains on Farxiga 5.  Her device is approaching ERI I reviewed with her the alerts Virl Axe

## 2022-03-10 NOTE — Telephone Encounter (Signed)
Attempted to call the patient. ?No answer- I left a detailed message on her voice mail (ok per DPR), that I have clarified with Dr. Graciela Husbands that antibiotics include what is routinely given pre/ post generator change.  ?Advised these will be administered at the hospital and her medication allergy for sulfa antibiotics will be taken into consideration. ? ?I have asked that if this did not answer her question or she has further questions, to please call me back/ send a mychart message. ? ? ?

## 2022-03-10 NOTE — Telephone Encounter (Signed)
Pt c/o medication issue: ? ?1. Name of Medication:   ? ?2. How are you currently taking this medication (dosage and times per day)?    ? ?3. Are you having a reaction (difficulty breathing--STAT)? no ? ?4. What is your medication issue? Calling with questions about the anabiotic dr Graciela Husbands mention.  ? ?

## 2022-03-14 ENCOUNTER — Other Ambulatory Visit
Admission: RE | Admit: 2022-03-14 | Discharge: 2022-03-14 | Disposition: A | Discharge status: Home / Self Care | Payer: Medicare HMO | Attending: Internal Medicine | Admitting: Internal Medicine

## 2022-03-14 DIAGNOSIS — I447 Left bundle-branch block, unspecified: Secondary | ICD-10-CM | POA: Insufficient documentation

## 2022-03-14 DIAGNOSIS — E039 Hypothyroidism, unspecified: Secondary | ICD-10-CM | POA: Diagnosis not present

## 2022-03-14 DIAGNOSIS — Z01812 Encounter for preprocedural laboratory examination: Secondary | ICD-10-CM | POA: Diagnosis not present

## 2022-03-14 DIAGNOSIS — I5022 Chronic systolic (congestive) heart failure: Secondary | ICD-10-CM | POA: Diagnosis not present

## 2022-03-14 DIAGNOSIS — I428 Other cardiomyopathies: Secondary | ICD-10-CM | POA: Insufficient documentation

## 2022-03-14 LAB — BASIC METABOLIC PANEL
Anion gap: 5 (ref 5–15)
BUN: 13 mg/dL (ref 8–23)
CO2: 29 mmol/L (ref 22–32)
Calcium: 9.6 mg/dL (ref 8.9–10.3)
Chloride: 105 mmol/L (ref 98–111)
Creatinine, Ser: 0.76 mg/dL (ref 0.44–1.00)
GFR, Estimated: >60 mL/min (ref >60)
Glucose, Bld: 165 mg/dL — ABNORMAL HIGH (ref 70–99)
Potassium: 3.7 mmol/L (ref 3.5–5.1)
Sodium: 139 mmol/L (ref 135–145)

## 2022-03-14 LAB — CBC
HCT: 36.8 % (ref 36.0–46.0)
Hemoglobin: 12 g/dL (ref 12.0–15.0)
MCH: 29.1 pg (ref 26.0–34.0)
MCHC: 32.6 g/dL (ref 30.0–36.0)
MCV: 89.3 fL (ref 80.0–100.0)
Platelets: 96 10*3/uL — ABNORMAL LOW (ref 150–400)
RBC: 4.12 MIL/uL (ref 3.87–5.11)
RDW: 12.7 % (ref 11.5–15.5)
WBC: 3.8 10*3/uL — ABNORMAL LOW (ref 4.0–10.5)
nRBC: 0 % (ref 0.0–0.2)

## 2022-03-15 ENCOUNTER — Other Ambulatory Visit: Payer: Self-pay | Admitting: *Deleted

## 2022-03-15 DIAGNOSIS — E039 Hypothyroidism, unspecified: Secondary | ICD-10-CM

## 2022-03-15 LAB — TSH: TSH: 0.021 u[IU]/mL — ABNORMAL LOW (ref 0.350–4.500)

## 2022-03-16 ENCOUNTER — Telehealth: Payer: Self-pay | Admitting: Family Medicine

## 2022-03-16 ENCOUNTER — Encounter: Payer: Self-pay | Admitting: Internal Medicine

## 2022-03-16 MED ORDER — LEVOTHYROXINE SODIUM 75 MCG PO TABS: ORAL_TABLET | ORAL | Status: DC

## 2022-03-16 NOTE — Telephone Encounter (Signed)
Please advise patient she needs to cut back on thyroid medication based on labs done by Dr. Caryl Comes. She should skip the medication on Mondays and Fridays so she only takes it 5 days a week. Will need to recheck levels in 2 months. Will call her when its time to recheck.  ?

## 2022-03-17 NOTE — Telephone Encounter (Signed)
Patient called, left VM to return the call to the office for lab results.  ?

## 2022-03-17 NOTE — Telephone Encounter (Signed)
Tried calling patient. Left message to call back. OK for PEC triage to advise.  ?

## 2022-03-18 NOTE — Telephone Encounter (Signed)
Patient returned call- advised/instructed per provider note- patient to expect call back when labs due ?

## 2022-03-21 ENCOUNTER — Ambulatory Visit (INDEPENDENT_AMBULATORY_CARE_PROVIDER_SITE_OTHER): Payer: Medicare HMO

## 2022-03-21 DIAGNOSIS — I428 Other cardiomyopathies: Secondary | ICD-10-CM | POA: Diagnosis not present

## 2022-03-22 LAB — CUP PACEART REMOTE DEVICE CHECK
Battery Remaining Longevity: 1 mo
Battery Voltage: 2.7 V
Brady Statistic AP VP Percent: 0 %
Brady Statistic AP VS Percent: 0 %
Brady Statistic AS VP Percent: 98.77 %
Brady Statistic AS VS Percent: 1.23 %
Brady Statistic RA Percent Paced: 0.01 %
Brady Statistic RV Percent Paced: 3.15 %
Date Time Interrogation Session: 20230515012503
HighPow Impedance: 46 Ohm
Implantable Lead Implant Date: 20160409
Implantable Lead Implant Date: 20160409
Implantable Lead Implant Date: 20160409
Implantable Lead Location: 753858
Implantable Lead Location: 753859
Implantable Lead Location: 753860
Implantable Lead Model: 4598
Implantable Lead Model: 5076
Implantable Pulse Generator Implant Date: 20160409
Lead Channel Impedance Value: 1064 Ohm
Lead Channel Impedance Value: 1292 Ohm
Lead Channel Impedance Value: 1349 Ohm
Lead Channel Impedance Value: 1368 Ohm
Lead Channel Impedance Value: 342 Ohm
Lead Channel Impedance Value: 399 Ohm
Lead Channel Impedance Value: 475 Ohm
Lead Channel Impedance Value: 513 Ohm
Lead Channel Impedance Value: 608 Ohm
Lead Channel Impedance Value: 665 Ohm
Lead Channel Impedance Value: 836 Ohm
Lead Channel Impedance Value: 988 Ohm
Lead Channel Impedance Value: 988 Ohm
Lead Channel Pacing Threshold Amplitude: 0.5 V
Lead Channel Pacing Threshold Amplitude: 0.75 V
Lead Channel Pacing Threshold Amplitude: 2.125 V
Lead Channel Pacing Threshold Pulse Width: 0.4 ms
Lead Channel Pacing Threshold Pulse Width: 0.4 ms
Lead Channel Pacing Threshold Pulse Width: 0.8 ms
Lead Channel Sensing Intrinsic Amplitude: 0.375 mV
Lead Channel Sensing Intrinsic Amplitude: 0.375 mV
Lead Channel Sensing Intrinsic Amplitude: 9.75 mV
Lead Channel Sensing Intrinsic Amplitude: 9.75 mV
Lead Channel Setting Pacing Amplitude: 1.5 V
Lead Channel Setting Pacing Amplitude: 2 V
Lead Channel Setting Pacing Amplitude: 2.5 V
Lead Channel Setting Pacing Pulse Width: 0.4 ms
Lead Channel Setting Pacing Pulse Width: 1 ms
Lead Channel Setting Sensing Sensitivity: 0.45 mV

## 2022-03-28 ENCOUNTER — Telehealth: Payer: Self-pay | Admitting: Physician Assistant

## 2022-03-28 NOTE — Telephone Encounter (Signed)
Paged by answering service.  Patient is scheduled for ICD generator change  tomorrow by Dr. Graciela Husbands.  Asking if okay to wear dentures.  No contraindication found.

## 2022-03-29 ENCOUNTER — Encounter
Admission: RE | Disposition: A | Discharge status: Home / Self Care | Payer: Self-pay | Source: Home / Self Care | Attending: Internal Medicine

## 2022-03-29 ENCOUNTER — Other Ambulatory Visit: Payer: Self-pay

## 2022-03-29 ENCOUNTER — Ambulatory Visit
Admission: RE | Admit: 2022-03-29 | Discharge: 2022-03-29 | Disposition: A | Discharge status: Home / Self Care | Payer: Medicare HMO | Attending: Internal Medicine | Admitting: Internal Medicine

## 2022-03-29 ENCOUNTER — Encounter: Payer: Self-pay | Admitting: Internal Medicine

## 2022-03-29 DIAGNOSIS — E0590 Thyrotoxicosis, unspecified without thyrotoxic crisis or storm: Secondary | ICD-10-CM | POA: Insufficient documentation

## 2022-03-29 DIAGNOSIS — I447 Left bundle-branch block, unspecified: Secondary | ICD-10-CM | POA: Diagnosis not present

## 2022-03-29 DIAGNOSIS — Z79890 Hormone replacement therapy (postmenopausal): Secondary | ICD-10-CM | POA: Diagnosis not present

## 2022-03-29 DIAGNOSIS — Z95810 Presence of automatic (implantable) cardiac defibrillator: Secondary | ICD-10-CM

## 2022-03-29 DIAGNOSIS — I5022 Chronic systolic (congestive) heart failure: Secondary | ICD-10-CM | POA: Insufficient documentation

## 2022-03-29 DIAGNOSIS — Z79899 Other long term (current) drug therapy: Secondary | ICD-10-CM | POA: Insufficient documentation

## 2022-03-29 DIAGNOSIS — Z45010 Encounter for checking and testing of cardiac pacemaker pulse generator [battery]: Secondary | ICD-10-CM

## 2022-03-29 DIAGNOSIS — Z4502 Encounter for adjustment and management of automatic implantable cardiac defibrillator: Secondary | ICD-10-CM | POA: Insufficient documentation

## 2022-03-29 DIAGNOSIS — R0609 Other forms of dyspnea: Secondary | ICD-10-CM | POA: Diagnosis not present

## 2022-03-29 DIAGNOSIS — I420 Dilated cardiomyopathy: Secondary | ICD-10-CM | POA: Diagnosis not present

## 2022-03-29 HISTORY — PX: ICD GENERATOR CHANGEOUT: EP1231

## 2022-03-29 LAB — GLUCOSE, CAPILLARY: Glucose-Capillary: 120 mg/dL — ABNORMAL HIGH (ref 70–99)

## 2022-03-29 SURGERY — ICD GENERATOR CHANGEOUT
Anesthesia: Moderate Sedation

## 2022-03-29 MED ORDER — ACETAMINOPHEN 325 MG PO TABS: 325.0000 mg | ORAL_TABLET | ORAL | Status: DC | PRN

## 2022-03-29 MED ORDER — SODIUM CHLORIDE 0.9 % IV SOLN
80.0000 mg | INTRAVENOUS | Status: DC
  Filled 2022-03-29: qty 2

## 2022-03-29 MED ORDER — SODIUM CHLORIDE 0.9 % IV SOLN
INTRAVENOUS | Status: DC | PRN
  Administered 2022-03-29: 80 mg

## 2022-03-29 MED ORDER — SODIUM CHLORIDE 0.9 % IV SOLN: INTRAVENOUS | Status: DC

## 2022-03-29 MED ORDER — FENTANYL CITRATE (PF) 100 MCG/2ML IJ SOLN
INTRAMUSCULAR | Status: DC | PRN
  Administered 2022-03-29: 25 ug via INTRAVENOUS

## 2022-03-29 MED ORDER — MIDAZOLAM HCL 2 MG/2ML IJ SOLN
INTRAMUSCULAR | Status: AC
  Filled 2022-03-29: qty 2

## 2022-03-29 MED ORDER — CEFAZOLIN SODIUM-DEXTROSE 2-4 GM/100ML-% IV SOLN: 2.0000 g | INTRAVENOUS | Status: DC

## 2022-03-29 MED ORDER — LIDOCAINE HCL 1 % IJ SOLN
INTRAMUSCULAR | Status: AC
  Filled 2022-03-29: qty 20

## 2022-03-29 MED ORDER — FENTANYL CITRATE (PF) 100 MCG/2ML IJ SOLN
INTRAMUSCULAR | Status: AC
  Filled 2022-03-29: qty 2

## 2022-03-29 MED ORDER — MIDAZOLAM HCL 2 MG/2ML IJ SOLN
INTRAMUSCULAR | Status: DC | PRN
  Administered 2022-03-29: 1 mg via INTRAVENOUS

## 2022-03-29 MED ORDER — CEFAZOLIN SODIUM-DEXTROSE 1-4 GM/50ML-% IV SOLN
INTRAVENOUS | Status: DC | PRN
  Administered 2022-03-29: 2 g via INTRAVENOUS

## 2022-03-29 MED ORDER — CEFAZOLIN SODIUM-DEXTROSE 2-4 GM/100ML-% IV SOLN
INTRAVENOUS | Status: AC
  Filled 2022-03-29: qty 100

## 2022-03-29 MED ORDER — LIDOCAINE HCL (PF) 1 % IJ SOLN
INTRAMUSCULAR | Status: DC | PRN
Start: 2022-03-29 — End: 2022-03-29
  Administered 2022-03-29: 30 mL

## 2022-03-29 SURGICAL SUPPLY — 17 items
CABLE SURG 12 DISP A/V CHANNEL (MISCELLANEOUS) ×1 IMPLANT
DEVICE DSSCT PLSMBLD 3.0S LGHT (MISCELLANEOUS) IMPLANT
HEMOSTAT SURGICEL 2X4 FIBR (HEMOSTASIS) ×1 IMPLANT
ICD CLARIA MRI DTMA1QQ (ICD Generator) ×1 IMPLANT
KIT SYRINGE INJ CVI SPIKEX1 (MISCELLANEOUS) ×1 IMPLANT
PAD ELECT DEFIB RADIOL ZOLL (MISCELLANEOUS) ×1 IMPLANT
PLASMABLADE 3.0S W/LIGHT (MISCELLANEOUS) ×2
POUCH AIGIS-R ANTIBACT ICD (Mesh General) ×2 IMPLANT
POUCH AIGIS-R ANTIBACT ICD LRG (Mesh General) IMPLANT
SPONGE XRAY 4X4 16PLY STRL (MISCELLANEOUS) ×3 IMPLANT
SUT SILK 0 FSL (SUTURE) ×1 IMPLANT
SUT VIC AB 2-0 CT1 27 (SUTURE) ×1
SUT VIC AB 2-0 CT1 TAPERPNT 27 (SUTURE) IMPLANT
SUT VIC AB 3-0 CT1 27 (SUTURE) ×1
SUT VIC AB 3-0 CT1 TAPERPNT 27 (SUTURE) IMPLANT
SUT VIC AB 3-0 X1 27 (SUTURE) ×2 IMPLANT
TRAY PACEMAKER INSERTION (PACKS) ×2 IMPLANT

## 2022-03-29 NOTE — Interval H&P Note (Signed)
History and Physical Interval Note:  03/29/2022 7:20 AM  Bethany [name]  has presented today for surgery, with the diagnosis of ICD Generator Changeout  Multi Leads   Pacemaker generator end of life MedTronic Rep.  The various methods of treatment have been discussed with the patient and family. After consideration of risks, benefits and other options for treatment, the patient has consented to  Procedure(s): ICD GENERATOR CHANGEOUT ([name]) as a surgical intervention.  The patient's history has been reviewed, patient examined, no change in status, stable for surgery.  I have reviewed the patient's chart and labs.  Questions were answered to the patient's satisfaction.     [name]

## 2022-03-29 NOTE — Discharge Instructions (Addendum)
No driving for 4 days  Keep wound dry for 24hr Dermabond will flake off in the next 10-14 days

## 2022-04-01 ENCOUNTER — Telehealth: Payer: Self-pay

## 2022-04-01 NOTE — Telephone Encounter (Signed)
The patient states under her incision site, she is itchy and red near the adhesive bandage.

## 2022-04-01 NOTE — Telephone Encounter (Signed)
Patient reports of red area that itches where she removed tegaderm.  Advised patient she can use hydrocortisone 1% topical, apply daily for the next few days to see if it will improve. Advised to keep area clean and dry and NOT apply over steristrips. Advised to call if further questions or concerns arise. Voiced understanding.

## 2022-04-08 ENCOUNTER — Telehealth: Payer: Self-pay

## 2022-04-08 NOTE — Progress Notes (Signed)
Chronic Care Management Pharmacy Assistant   Name: ROMI RATHEL  MRN: [id number] DOB: 03/29/1946  Reason for Encounter: Diabetes Disease State Call   Recent office visits:  None ID  Recent consult visits:  03/10/2022 Helane Gunther, DPM (Podiatry) for Pain due to Onychomycosis of toenail of Right Foot- No medication changes noted, No orders placed, Patient to follow-up in 3 months  03/10/2022 [name], MD (Cardiology) for NICM- No medication changes noted, Lab orders placed, No follow-up noted  03/08/2022 Olena Heckle, MD (Cardiology) for Hypertension- No medication changes noted, No orders placed, Patient to follow-up in 6 months  Hospital visits:  Medication Reconciliation was completed by comparing discharge summary, patient's EMR and Pharmacy list, and upon discussion with patient.  Admitted to the hospital on 03/29/2022 due to Pacemaker Generator. Discharge date was 03/29/2022. Discharged from [facility].    New?Medications Started at [facility] Discharge:?? -Started None ID  Medication Changes at Hospital Discharge: -Changed None ID  Medications Discontinued at Hospital Discharge: -Stopped None ID  Medications that remain the same after Hospital Discharge:??  -All other medications will remain the same.    Medications: Outpatient Encounter Medications as of 04/08/2022  Medication Sig   acetaminophen (TYLENOL) 500 MG tablet Take 1,000 mg by mouth 2 (two) times daily as needed for mild pain or moderate pain. (Patient not taking: Reported on 03/29/2022)   Alcohol Swabs (B-D SINGLE USE SWABS REGULAR) PADS USE TO CHECK BLOOD SUGAR DAILY   aspirin EC 81 MG tablet Take 81 mg by mouth daily.   atorvastatin (LIPITOR) 20 MG tablet TAKE 1 TABLET EVERY DAY   Blood Glucose Calibration (TRUE METRIX LEVEL 1) Low SOLN Use as directed with glucose meter   carvedilol (COREG) 3.125 MG tablet TAKE 1 TABLET TWICE DAILY WITH MEALS   cyanocobalamin  100 MCG tablet Take 100 mcg by mouth daily.   FARXIGA 5 MG TABS tablet TAKE 1 TABLET EVERY DAY BEFORE BREAKFAST   ferrous sulfate 325 (65 FE) MG tablet Take 325 mg by mouth 2 (two) times daily with a meal.   furosemide (LASIX) 40 MG tablet Take 1 tablet (40 mg) by mouth every other day   glucose blood (TRUE METRIX BLOOD GLUCOSE TEST) test strip TEST BLOOD SUGAR EVERY DAY   JANUMET XR [phone] MG TB24 TAKE 1 TABLET EVERY DAY   levothyroxine (SYNTHROID) 75 MCG tablet One tablet 5 days a week (Sa, Sun, Tuesday, Wednesday and Thursday)   Multiple Vitamin (MULTIVITAMIN) tablet Take 1 tablet by mouth daily.   pantoprazole (PROTONIX) 40 MG tablet TAKE 1 TABLET (40 MG TOTAL) BY MOUTH DAILY.   sacubitril-valsartan (ENTRESTO) 24-26 MG Take 0.5 tablets by mouth 2 (two) times daily.   senna (SENOKOT) 8.6 MG tablet Take 1 tablet by mouth daily as needed. (Patient not taking: Reported on 03/29/2022)   TRUEplus Lancets 33G MISC CHECK BLOOD SUGAR ONE TIME DAILY   No facility-administered encounter medications on file as of 04/08/2022.   Care Gaps: Tetanus/TDAP Diabetic Eye Exam Dexa Scan Zoster Vaccine  Star Rating Drugs: Atorvastatin 20 mg last filled on 03/27/2022 for a 90-Day supply with [facility] Farxiga 5 mg last filled on 03/04/2022 for a 90-Day supply with [facility] Janumet [phone] mg last filled on 03/04/2022 for a 90-Day supply with [facility]  Recent Relevant Labs: Lab Results  Component Value Date/Time   HGBA1C 6.9 (H) 01/26/2022 10:11 AM   HGBA1C 7.0 (A) 09/24/2021 11:18 AM   HGBA1C 6.8 (A)  05/24/2021 03:54 PM   HGBA1C 8.2 (H) 04/04/2018 03:02 PM   HGBA1C 6.3 02/20/2014 07:57 PM   MICROALBUR negative 06/02/2020 10:36 AM   MICROALBUR negative 12/04/2018 11:50 AM    Kidney Function Lab Results  Component Value Date/Time   CREATININE 0.76 03/14/2022 02:39 PM   CREATININE 0.79 01/26/2022 10:11 AM   CREATININE 0.70 02/04/2015 02:26 PM   CREATININE 0.89 11/10/2014  11:47 AM   GFR 88.96 02/05/2015 10:34 AM   GFRNONAA >60 03/14/2022 02:39 PM   GFRNONAA >60 02/04/2015 02:26 PM   GFRAA 79 07/01/2020 11:26 AM   GFRAA >60 02/04/2015 02:26 PM   Current antihyperglycemic regimen:  Farxiga 5 mg 1 tablet daily Janumet XR [phone] mg 1 tablet daily  What recent interventions/DTPs have been made to improve glycemic control:  None ID  Have there been any recent hospitalizations or ED visits since last visit with CPP? Yes  Patient denies hypoglycemic symptoms, including Pale, Sweaty, Shaky, Hungry, Nervous/irritable, and Vision changes  Patient denies hyperglycemic symptoms, including blurry vision, excessive thirst, fatigue, and polyuria How often are you checking your blood sugar? once daily What are your blood sugars ranging?  Fasting: Patient wasn't home and couldn't remember her number but she stated she knows that it was normal  During the week, how often does your blood glucose drop below 70? Never Are you checking your feet daily/regularly?  Yes  Adherence Review: Is the patient currently on a STATIN medication? Yes Is the patient currently on ACE/ARB medication? Yes Does the patient have >5 day gap between last estimated fill dates? No  I spoke with the patient and she reports she is feeling as good as she can. Patient had a pacemaker placed a few weeks ago, and reports she has a follow-up tomorrow. Patient stated that there was a little skin irritation from the adhesive tape they placed on her during surgery and she stated she was advised to use Hydrocortisone cream and it did help some. I encouraged patient to let the provider look at the area tomorrow when she does her follow-up.   Patient reports she is taking all medications as directed and she thinks she needs a refill but she wasn't home to advise me on what she needs. I advised patient she can give me a call back directly at [phone] once she get's home to check. Patient verbalized  understanding and has no other concerns or issues at this time.   Patient has a telephone appointment with Angelena Sole, CPP on 07/08/2022 @ 0830  Adelene Idler, CPA/CMA Clinical Pharmacist Assistant Phone: [phone]

## 2022-04-11 NOTE — Progress Notes (Signed)
Remote ICD transmission.   

## 2022-04-13 ENCOUNTER — Ambulatory Visit (INDEPENDENT_AMBULATORY_CARE_PROVIDER_SITE_OTHER): Payer: Medicare HMO

## 2022-04-13 DIAGNOSIS — I447 Left bundle-branch block, unspecified: Secondary | ICD-10-CM

## 2022-04-13 LAB — CUP PACEART INCLINIC DEVICE CHECK
Battery Remaining Longevity: 86 mo
Battery Voltage: 3.1 V
Brady Statistic AP VP Percent: 0.03 %
Brady Statistic AP VS Percent: 0.01 %
Brady Statistic AS VP Percent: 98.73 %
Brady Statistic AS VS Percent: 1.22 %
Brady Statistic RA Percent Paced: 0.04 %
Brady Statistic RV Percent Paced: 2.41 %
Date Time Interrogation Session: 20230607151700
HighPow Impedance: 45 Ohm
Implantable Lead Implant Date: 20160409
Implantable Lead Implant Date: 20160409
Implantable Lead Implant Date: 20160409
Implantable Lead Location: 753858
Implantable Lead Location: 753859
Implantable Lead Location: 753860
Implantable Lead Model: 4598
Implantable Lead Model: 5076
Implantable Pulse Generator Implant Date: 20230523
Lead Channel Impedance Value: 1007 Ohm
Lead Channel Impedance Value: 1121 Ohm
Lead Channel Impedance Value: 1140 Ohm
Lead Channel Impedance Value: 1178 Ohm
Lead Channel Impedance Value: 255.093
Lead Channel Impedance Value: 266.667
Lead Channel Impedance Value: 286.508
Lead Channel Impedance Value: 304 Ohm
Lead Channel Impedance Value: 312.507
Lead Channel Impedance Value: 330.057
Lead Channel Impedance Value: 361 Ohm
Lead Channel Impedance Value: 456 Ohm
Lead Channel Impedance Value: 475 Ohm
Lead Channel Impedance Value: 551 Ohm
Lead Channel Impedance Value: 608 Ohm
Lead Channel Impedance Value: 722 Ohm
Lead Channel Impedance Value: 931 Ohm
Lead Channel Impedance Value: 931 Ohm
Lead Channel Pacing Threshold Amplitude: 0.5 V
Lead Channel Pacing Threshold Amplitude: 0.75 V
Lead Channel Pacing Threshold Amplitude: 2 V
Lead Channel Pacing Threshold Pulse Width: 0.4 ms
Lead Channel Pacing Threshold Pulse Width: 0.4 ms
Lead Channel Pacing Threshold Pulse Width: 1 ms
Lead Channel Sensing Intrinsic Amplitude: 0.375 mV
Lead Channel Sensing Intrinsic Amplitude: 8.875 mV
Lead Channel Setting Pacing Amplitude: 1.5 V
Lead Channel Setting Pacing Amplitude: 2 V
Lead Channel Setting Pacing Amplitude: 2.5 V
Lead Channel Setting Pacing Pulse Width: 0.4 ms
Lead Channel Setting Pacing Pulse Width: 1 ms
Lead Channel Setting Sensing Sensitivity: 0.3 mV

## 2022-04-13 NOTE — Patient Instructions (Signed)
? ?  After Your ICD ?(Implantable Cardiac Defibrillator) ? ? ? ?Monitor your defibrillator site for redness, swelling, and drainage. Call the device clinic at [phone] if you experience these symptoms or fever/chills. ? ?Your incision was closed with Steri-strips or staples:  You may shower and wash your incision with soap and water. Avoid lotions, ointments, or perfumes over your incision until it is well-healed. ? ?You may use a hot tub or a pool after your wound check appointment if the incision is completely closed. ? ?Your ICD is MRI compatible. ? ?Your ICD is designed to protect you from life threatening heart rhythms. Because of this, you may receive a shock.  ? ?1 shock with no symptoms:  Call the office during business hours. ?1 shock with symptoms (chest pain, chest pressure, dizziness, lightheadedness, shortness of breath, overall feeling unwell):  Call 911. ?If you experience 2 or more shocks in 24 hours:  Call 911. ?If you receive a shock, you should not drive.  ?[place] DMV - no driving for 6 months if you receive appropriate therapy from your ICD.  ? ?ICD Alerts:  Some alerts are vibratory and others beep. These are NOT emergencies. Please call our office to let us know. If this occurs at night or on weekends, it can wait until the next business day. Send a remote transmission. ? ?If your device is capable of reading fluid status (for heart failure), you will be offered monthly monitoring to review this with you.  ? ?Remote monitoring is used to monitor your ICD from home. This monitoring is scheduled every 91 days by our office. It allows us to keep an eye on the functioning of your device to ensure it is working properly. You will routinely see your Electrophysiologist annually (more often if necessary).  ?

## 2022-04-13 NOTE — Progress Notes (Signed)
Wound check appointment s/p CRT-D gen change 03/29/22. Steri-strips removed. Wound without redness or edema. Incision edges approximated, wound well healed. Normal device function. Thresholds, sensing, and impedances consistent with implant measurements. Device programmed for chronic leads. Histogram distribution appropriate for patient and level of activity. No mode switches or ventricular arrhythmias noted. BiV pacing 98%. Patient educated about wound care, arm mobility, lifting restrictions, shock plan. Patient enrolled in remote monitoring with next transmission 06/28/22. 91 day follow up with Dr. Graciela Husbands 07/05/22.

## 2022-04-22 ENCOUNTER — Other Ambulatory Visit: Payer: Self-pay

## 2022-04-22 DIAGNOSIS — I5022 Chronic systolic (congestive) heart failure: Secondary | ICD-10-CM

## 2022-04-22 DIAGNOSIS — E1169 Type 2 diabetes mellitus with other specified complication: Secondary | ICD-10-CM

## 2022-04-22 NOTE — Patient Outreach (Signed)
Received a referral from [facility]. The Primary Care Physician has a [facility] Embedded Social Work.   I have sent a referral to the Embedded Team.   Iverson Alamin, Donivan Scull [facility] Care Management Assistant [facility] Care Management [phone]

## 2022-04-25 ENCOUNTER — Telehealth: Payer: Self-pay

## 2022-04-25 NOTE — Chronic Care Management (AMB) (Signed)
  Chronic Care Management   Note  04/25/2022 Name: Bethany [name] MRN: [id number] DOB: [birth year]  Bethany [name] is a 76 y.o. year old female who is a primary care patient of Fisher, Demetrios Isaacs, MD. Bethany [name] is currently enrolled in care management services. An additional referral for LCSW  was placed.   Follow up plan: Telephone appointment with care management team member scheduled for:05/03/2022  Bethany [name], RMA Care Guide, Embedded Care Coordination [facility]  [place], [place] [address] Direct Dial: [phone] [name]: [email or web address]

## 2022-04-27 ENCOUNTER — Telehealth: Payer: Self-pay

## 2022-04-27 NOTE — Telephone Encounter (Signed)
   Telephone encounter was:  Successful.  04/27/2022 Name: Bethany [name] MRN: [id number] DOB: January 24, 1946  Bethany [name] is a 76 y.o. year old female who is a primary care patient of Fisher, Demetrios Isaacs, MD . The community resource team was consulted for assistance with Food Insecurity  Care guide performed the following interventions: Spoke with patient about meals on wheels, but she is not interested at this time.  She is interested in having a list of food pantries emailed to [email or web address]. She has already received her Mom's Meals benefit post discharge. The only way to extend the benefit is to self pay.  Follow Up Plan:  Care guide will follow up with patient by phone over the next 7 days.      Williemae Muriel, AAS Paralegal, [facility] Care Guide  Embedded Care Coordination [place]  Care Management  [address] [place], [place] [address] [email or web address]  ?? [phone]   [email or web address]

## 2022-05-03 ENCOUNTER — Telehealth: Payer: Self-pay

## 2022-05-03 ENCOUNTER — Telehealth: Payer: Medicare HMO

## 2022-05-03 ENCOUNTER — Telehealth: Payer: Self-pay | Admitting: *Deleted

## 2022-05-03 NOTE — Telephone Encounter (Signed)
  Care Management   Follow Up Note   05/03/2022 Name: SIMIYA MUSIC MRN: [id number] DOB: December 02, 1945   Referred by: Malva Limes, MD Reason for referral : No chief complaint on file.   An unsuccessful telephone outreach was attempted today. The patient was referred to the case management team for assistance with care management and care coordination.   Follow Up Plan: Telephone follow up appointment with care management team member to be re-scheduled by careguide   Verna Czech, LCSW Clinical Social Worker  [facility] Care Management [phone]

## 2022-05-13 ENCOUNTER — Telehealth: Payer: Self-pay

## 2022-05-13 DIAGNOSIS — E119 Type 2 diabetes mellitus without complications: Secondary | ICD-10-CM

## 2022-05-13 DIAGNOSIS — I5022 Chronic systolic (congestive) heart failure: Secondary | ICD-10-CM

## 2022-05-13 MED ORDER — DAPAGLIFLOZIN PROPANEDIOL 5 MG PO TABS: ORAL_TABLET | ORAL | 3 refills | Status: DC

## 2022-05-13 NOTE — Progress Notes (Signed)
Chronic Care Management Pharmacy Assistant   Name: Bethany [name]  MRN: [id number] DOB: 1946/04/21  Reason for Encounter: Diabetes Disease State Call/Patient Assistance for [place]   Recent office visits:  None ID  Recent consult visits:  None ID  Hospital visits:  None in previous 6 months  Medications: Outpatient Encounter Medications as of 05/13/2022  Medication Sig   acetaminophen (TYLENOL) 500 MG tablet Take 1,000 mg by mouth 2 (two) times daily as needed for mild pain or moderate pain. (Patient not taking: Reported on 03/29/2022)   Alcohol Swabs (B-D SINGLE USE SWABS REGULAR) PADS USE TO CHECK BLOOD SUGAR DAILY   aspirin EC 81 MG tablet Take 81 mg by mouth daily.   atorvastatin (LIPITOR) 20 MG tablet TAKE 1 TABLET EVERY DAY   Blood Glucose Calibration (TRUE METRIX LEVEL 1) Low SOLN Use as directed with glucose meter   carvedilol (COREG) 3.125 MG tablet TAKE 1 TABLET TWICE DAILY WITH MEALS   cyanocobalamin 100 MCG tablet Take 100 mcg by mouth daily.   FARXIGA 5 MG TABS tablet TAKE 1 TABLET EVERY DAY BEFORE BREAKFAST   ferrous sulfate 325 (65 FE) MG tablet Take 325 mg by mouth 2 (two) times daily with a meal.   furosemide (LASIX) 40 MG tablet Take 1 tablet (40 mg) by mouth every other day   glucose blood (TRUE METRIX BLOOD GLUCOSE TEST) test strip TEST BLOOD SUGAR EVERY DAY   JANUMET XR [phone] MG TB24 TAKE 1 TABLET EVERY DAY   levothyroxine (SYNTHROID) 75 MCG tablet One tablet 5 days a week (Sa, Sun, Tuesday, Wednesday and Thursday)   Multiple Vitamin (MULTIVITAMIN) tablet Take 1 tablet by mouth daily.   pantoprazole (PROTONIX) 40 MG tablet TAKE 1 TABLET (40 MG TOTAL) BY MOUTH DAILY.   sacubitril-valsartan (ENTRESTO) 24-26 MG Take 0.5 tablets by mouth 2 (two) times daily.   senna (SENOKOT) 8.6 MG tablet Take 1 tablet by mouth daily as needed. (Patient not taking: Reported on 03/29/2022)   TRUEplus Lancets 33G MISC CHECK BLOOD SUGAR ONE TIME DAILY   No facility-administered  encounter medications on file as of 05/13/2022.   Care Gaps: Diabetic Kidney Evaluation  Tetanus/TDAP Zoster Vaccines Diabetic Eye Exam Dexa Scan  Star Rating Drugs: Atorvastatin 20 mg last filled on 03/27/2022 for a 90-Day supply with [facility] Farxiga 5 mg last filled on 03/04/2022 for a 90-Day supply with [facility] Janumet XR [phone] mg last filled on 03/04/2022 for a 90-Day supply with [facility]  Recent Relevant Labs: Lab Results  Component Value Date/Time   HGBA1C 6.9 (H) 01/26/2022 10:11 AM   HGBA1C 7.0 (A) 09/24/2021 11:18 AM   HGBA1C 6.8 (A) 05/24/2021 03:54 PM   HGBA1C 8.2 (H) 04/04/2018 03:02 PM   HGBA1C 6.3 02/20/2014 07:57 PM   MICROALBUR negative 06/02/2020 10:36 AM   MICROALBUR negative 12/04/2018 11:50 AM    Kidney Function Lab Results  Component Value Date/Time   CREATININE 0.76 03/14/2022 02:39 PM   CREATININE 0.79 01/26/2022 10:11 AM   CREATININE 0.70 02/04/2015 02:26 PM   CREATININE 0.89 11/10/2014 11:47 AM   GFR 88.96 02/05/2015 10:34 AM   GFRNONAA >60 03/14/2022 02:39 PM   GFRNONAA >60 02/04/2015 02:26 PM   GFRAA 79 07/01/2020 11:26 AM   GFRAA >60 02/04/2015 02:26 PM   Current antihyperglycemic regimen:  Farxiga 5 mg 1 tablet daily Janumet XR [phone] mg 1 tablet daily  What recent interventions/DTPs have been made to improve glycemic control:  None ID  Have  there been any recent hospitalizations or ED visits since last visit with CPP? No  Patient denies hypoglycemic symptoms, including Pale, Sweaty, Shaky, Hungry, Nervous/irritable, and Vision changes Patient denies hyperglycemic symptoms, including blurry vision, excessive thirst, fatigue, polyuria, and weakness  How often are you checking your blood sugar? once daily What are your blood sugars ranging?  Fasting: This morning it was 91 patient states her fasting BS normally runs between 91-112  During the week, how often does your blood glucose drop below 70?  Patient  stated every once in a while she will get a low but that does not happen frequently. Patient advises sometimes she will eat a early dinner and not have a snack so this does cause her to have a low sometimes.  Are you checking your feet daily/regularly? Yes  Adherence Review: Is the patient currently on a STATIN medication? Yes Is the patient currently on ACE/ARB medication? Yes Does the patient have >5 day gap between last estimated fill dates? No  I spoke to the patient and she reports that for the most part she is doing well. Patient advised that since she has had her pacemaker placed she does feel tired more often. Patient denies any issues current issues concerning her blood sugars.   Patient would like to see if she qualifies for patient assistance for her Marcelline Deist due to financial strain. I advised her that I would complete the PAP for her Marcelline Deist online with her permission and if she is approved she will receive this medication for free delivered to her address for the rest of the year. Patient is agreeable to see if she can get approved for this assistance. Patient has  no other concerns or issues at this time.  Patient assistance application for Marcelline Deist started online via [employer]. Patient was approved and has been enrolled into the program with [employer] as of today 05/13/2022 until 11/06/2022. Patient has been advised of her approval and encouraged to give [facility] a call to have them discontinue her automatic refills for this medication.   CPP has been notified of approval and tasked to send script over to [employer]. Patient advised once I have been notified the script was sent over I will contact [employer] to follow-up and have her first shipment sent out.   Patient has a telephone appointment with Angelena Sole, CPP on 07/08/2022 @ 0830  Adelene Idler, CPA/CMA Clinical Pharmacist Assistant Phone: [phone]

## 2022-05-13 NOTE — Addendum Note (Signed)
Addended by: Julious Payer A on: 05/13/2022 12:44 PM   Modules accepted: Orders

## 2022-05-18 ENCOUNTER — Telehealth: Payer: Self-pay | Admitting: Family Medicine

## 2022-05-18 NOTE — Telephone Encounter (Signed)
Patient unsure of the name of pharmacy PCP suggested she receive her dapagliflozin propanediol (FARXIGA) 5 MG TABS tablet from. Patient would like a follow up call.

## 2022-05-24 ENCOUNTER — Ambulatory Visit: Payer: Medicare HMO | Admitting: *Deleted

## 2022-05-24 NOTE — Chronic Care Management (AMB) (Signed)
Assessment: Assessed patient's previous and current treatment, coping skills, support system and barriers to care.. No Care Plan was developed during this encounter.  Recent life changes Efrain Sella: This Child psychotherapist contacted patient to address any mental health challenges that needed to ne addressed. Depression screening complete, patient denied current challenges with depression at this time and declined further mental health follow up. Patient confirms a positive network of support-confirming being very active with the [employer] Witness community. No additional community  resources identified at this time  Interventions:PHQ2/ PHQ9 completed Participation in counseling encouraged if needed in the future  Recommendation: Patient may benefit from mental health counseling, and is in agreement to contacting this social worker if mental health resources are needed in the future.  Follow up Plan: No follow up scheduled with CM LCSW at this time. Patient will call office if needed.

## 2022-05-26 ENCOUNTER — Ambulatory Visit (INDEPENDENT_AMBULATORY_CARE_PROVIDER_SITE_OTHER): Payer: Medicare HMO | Admitting: Physician Assistant

## 2022-05-26 ENCOUNTER — Encounter: Payer: Self-pay | Admitting: Physician Assistant

## 2022-05-26 ENCOUNTER — Ambulatory Visit: Payer: Self-pay | Admitting: *Deleted

## 2022-05-26 VITALS — BP 99/66 | HR 84 | Temp 98.4°F | Resp 16 | Wt 112.6 lb

## 2022-05-26 DIAGNOSIS — N3941 Urge incontinence: Secondary | ICD-10-CM | POA: Diagnosis not present

## 2022-05-26 DIAGNOSIS — E119 Type 2 diabetes mellitus without complications: Secondary | ICD-10-CM | POA: Diagnosis not present

## 2022-05-26 DIAGNOSIS — R350 Frequency of micturition: Secondary | ICD-10-CM | POA: Diagnosis not present

## 2022-05-26 DIAGNOSIS — R5383 Other fatigue: Secondary | ICD-10-CM | POA: Diagnosis not present

## 2022-05-26 DIAGNOSIS — E039 Hypothyroidism, unspecified: Secondary | ICD-10-CM

## 2022-05-26 LAB — POCT URINALYSIS DIPSTICK
Bilirubin, UA: NEGATIVE
Blood, UA: NEGATIVE
Glucose, UA: POSITIVE — AB
Ketones, UA: NEGATIVE
Leukocytes, UA: NEGATIVE
Nitrite, UA: POSITIVE
Odor: NORMAL
Protein, UA: NEGATIVE
Spec Grav, UA: <=1.005 — AB (ref 1.010–1.025)
Urobilinogen, UA: 0.2 E.U./dL
pH, UA: 5 (ref 5.0–8.0)

## 2022-05-26 MED ORDER — LEVOTHYROXINE SODIUM 50 MCG PO TABS: 50.0000 ug | ORAL_TABLET | Freq: Every day | ORAL | 3 refills | Status: DC

## 2022-05-26 NOTE — Telephone Encounter (Signed)
Reason for Disposition  [1] MILD weakness (i.e., does not interfere with ability to work, go to school, normal activities) AND [2] persists > 1 week  Answer Assessment - Initial Assessment Questions 1. DESCRIPTION: "Describe how you are feeling."     Fatigue, legs and feet cold 2. SEVERITY: "How bad is it?"  "Can you stand and walk?"   - MILD (0-3): Feels weak or tired, but does not interfere with work, school or normal activities.   - MODERATE (4-7): Able to stand and walk; weakness interferes with work, school, or normal activities.   - SEVERE (8-10): Unable to stand or walk; unable to do usual activities.     Mild/moderate 3. ONSET: "When did these symptoms begin?" (e.g., hours, days, weeks, months)     2 weeks 4. CAUSE: "What do you think is causing the weakness or fatigue?" (e.g., not drinking enough fluids, medical problem, trouble sleeping)     Trouble sleeping, legs weak 5. NEW MEDICINES:  "Have you started on any new medicines recently?" (e.g., opioid pain medicines, benzodiazepines, muscle relaxants, antidepressants, antihistamines, neuroleptics, beta blockers)     no 6. OTHER SYMPTOMS: "Do you have any other symptoms?" (e.g., chest pain, fever, cough, SOB, vomiting, diarrhea, bleeding, other areas of pain)     Itching on hands, leg weakness, dizziness- on/off 7. PREGNANCY: "Is there any chance you are pregnant?" "When was your last menstrual period?"  Protocols used: Weakness (Generalized) and Fatigue-A-AH

## 2022-05-26 NOTE — Progress Notes (Signed)
I,Asah Lamay Robinson,acting as a Neurosurgeon for [employer], PA-C.,have documented all relevant documentation on the behalf of Debera Lat, PA-C,as directed by  [employer], PA-C while in the presence of [employer], PA-C.   Established patient visit   Patient: Bethany [name]   DOB: [birth year]   76 y.o. Female  MRN: [id number] Visit Date: 05/26/2022  Today's healthcare provider: Debera Lat, PA-C   Chief Complaint  Patient presents with   Fatigue   Subjective    Patient presents today for fatigue, weakness, cold feet, legs feel week.  Reports this is almost all the time. Onset x 2 wks. Denies any falls.  Last TSH done on 01/26/22- 0.019.  Patient currently on synthroid 75 mcg everyday except  Monday and Friday. It looks like her dose was last adjusted on 09/29/21.   Also reports pelvic discomfort.  States it's hard to describe. Having frequent urination with leakage for months.     Medications: Outpatient Medications Prior to Visit  Medication Sig   acetaminophen (TYLENOL) 500 MG tablet Take 1,000 mg by mouth 2 (two) times daily as needed for mild pain or moderate pain.   Alcohol Swabs (B-D SINGLE USE SWABS REGULAR) PADS USE TO CHECK BLOOD SUGAR DAILY   aspirin EC 81 MG tablet Take 81 mg by mouth daily.   atorvastatin (LIPITOR) 20 MG tablet TAKE 1 TABLET EVERY DAY   Blood Glucose Calibration (TRUE METRIX LEVEL 1) Low SOLN Use as directed with glucose meter   carvedilol (COREG) 3.125 MG tablet TAKE 1 TABLET TWICE DAILY WITH MEALS   cyanocobalamin 100 MCG tablet Take 100 mcg by mouth daily.   dapagliflozin propanediol (FARXIGA) 5 MG TABS tablet TAKE 1 TABLET EVERY DAY BEFORE BREAKFAST   ferrous sulfate 325 (65 FE) MG tablet Take 325 mg by mouth 2 (two) times daily with a meal.   furosemide (LASIX) 40 MG tablet Take 1 tablet (40 mg) by mouth every other day   glucose blood (TRUE METRIX BLOOD GLUCOSE TEST) test strip TEST BLOOD SUGAR EVERY DAY   JANUMET XR [phone] MG TB24 TAKE 1  TABLET EVERY DAY   levothyroxine (SYNTHROID) 75 MCG tablet One tablet 5 days a week (Sa, Sun, Tuesday, Wednesday and Thursday)   Multiple Vitamin (MULTIVITAMIN) tablet Take 1 tablet by mouth daily.   pantoprazole (PROTONIX) 40 MG tablet TAKE 1 TABLET (40 MG TOTAL) BY MOUTH DAILY.   sacubitril-valsartan (ENTRESTO) 24-26 MG Take 0.5 tablets by mouth 2 (two) times daily.   senna (SENOKOT) 8.6 MG tablet Take 1 tablet by mouth daily as needed.   TRUEplus Lancets 33G MISC CHECK BLOOD SUGAR ONE TIME DAILY   No facility-administered medications prior to visit.    Review of Systems  Constitutional: Negative.   Respiratory: Negative.    Cardiovascular: Negative.   Gastrointestinal: Negative.   Except see HPI     Objective    BP 99/66 (BP Location: Right Arm, Patient Position: Sitting, Cuff Size: Normal)   Pulse 84   Temp 98.4 F (36.9 C) (Oral)   Resp 16   Wt 112 lb 9.6 oz (51.1 kg)   LMP  (LMP Unknown)   SpO2 96%   BMI 21.28 kg/m    Physical Exam Vitals reviewed.  Constitutional:      General: She is not in acute distress.    Appearance: Normal appearance. She is well-developed. She is not diaphoretic.  HENT:     Head: Normocephalic and atraumatic.  Eyes:  General: No scleral icterus.    Conjunctiva/sclera: Conjunctivae normal.  Neck:     Thyroid: No thyromegaly.  Cardiovascular:     Rate and Rhythm: Normal rate and regular rhythm.     Pulses: Normal pulses.     Heart sounds: Normal heart sounds. No murmur heard. Pulmonary:     Effort: Pulmonary effort is normal. No respiratory distress.     Breath sounds: Normal breath sounds. No wheezing, rhonchi or rales.  Musculoskeletal:     Cervical back: Neck supple.     Right lower leg: No edema.     Left lower leg: No edema.  Lymphadenopathy:     Cervical: No cervical adenopathy.  Skin:    General: Skin is warm and dry.     Findings: No rash.  Neurological:     Mental Status: She is alert and oriented to person, place,  and time. Mental status is at baseline.  Psychiatric:        Mood and Affect: Mood normal.        Behavior: Behavior normal.     No results found for any visits on 05/26/22.  Assessment & Plan     1. Frequency of urination - POCT Urinalysis Dipstick positive for glucose   2. Adult hypothyroidism Last TSH was 0.021 - TSH - CBC with Differential/Platelet - levothyroxine (SYNTHROID) 50 MCG tablet; Take 1 tablet (50 mcg total) by mouth daily.  Dispense: 90 tablet; Refill: 3 - T4, free  Fu in a mo   3. Other fatigue  - TSH - CBC with Differential/Platelet - Comprehensive metabolic panel - Hemoglobin A1c  4. Type 2 diabetes mellitus without complication, without long-term current use of insulin (HCC) Last A1C 6.9 4 mo ago. - CBC with Differential/Platelet - Comprehensive metabolic panel - Hemoglobin A1c Continue current med regimen Lifestyle modifications advised  5. Urge incontinence Chronic.  Might need oxybutynin or mirabegron if pt agrees Fu as scheduled    6. HTN BP today 99/66 Pt might need to measure her BP at home and bring her records to the next appt? Recheck after TSH will be stabilized.  The patient was advised to call back or seek an in-person evaluation if the symptoms worsen or if the condition fails to improve as anticipated.  I discussed the assessment and treatment plan with the patient. The patient was provided an opportunity to ask questions and all were answered. The patient agreed with the plan and demonstrated an understanding of the instructions.  The entirety of the information documented in the History of Present Illness, Review of Systems and Physical Exam were personally obtained by me. Portions of this information were initially documented by the CMA and reviewed by me for thoroughness and accuracy.  Portions of this note were created using dictation software and may contain typographical errors.     Debera Lat, PA-C  [facility] [phone] (phone) [phone] (fax)  [facility]

## 2022-05-26 NOTE — Telephone Encounter (Signed)
  Chief Complaint: fatigue Symptoms: fatigue, weakness, cold feet, legs feel weak Frequency: 2 weeks Pertinent Negatives: Patient denies chest pain, fever, cough, SOB, vomiting, diarrhea, bleeding, other areas of pain Disposition: [] ED /[] Urgent Care (no appt availability in office) / [x] Appointment(In office/virtual)/ []  [facility]/ [] Home Care/ [] Refused Recommended Disposition /[] [facility]/ []  Follow-up with PCP Additional Notes: Patient has been scheduled first available- and put on cancellation list. Patient advised UC if she gets worse.

## 2022-05-26 NOTE — Telephone Encounter (Signed)
Patient advised to come in now. Work in- appointment scheduled with [employer], PA-C.

## 2022-05-27 LAB — CBC WITH DIFFERENTIAL/PLATELET
Basophils Absolute: 0 10*3/uL (ref 0.0–0.2)
Basos: 0 %
EOS (ABSOLUTE): 0 10*3/uL (ref 0.0–0.4)
Eos: 1 %
Hematocrit: 37.6 % (ref 34.0–46.6)
Hemoglobin: 12.6 g/dL (ref 11.1–15.9)
Immature Grans (Abs): 0 10*3/uL (ref 0.0–0.1)
Immature Granulocytes: 0 %
Lymphocytes Absolute: 1.3 10*3/uL (ref 0.7–3.1)
Lymphs: 30 %
MCH: 29.6 pg (ref 26.6–33.0)
MCHC: 33.5 g/dL (ref 31.5–35.7)
MCV: 88 fL (ref 79–97)
Monocytes Absolute: 0.5 10*3/uL (ref 0.1–0.9)
Monocytes: 11 %
Neutrophils Absolute: 2.6 10*3/uL (ref 1.4–7.0)
Neutrophils: 58 %
Platelets: 104 10*3/uL — ABNORMAL LOW (ref 150–450)
RBC: 4.26 x10E6/uL (ref 3.77–5.28)
RDW: 12 % (ref 11.7–15.4)
WBC: 4.5 10*3/uL (ref 3.4–10.8)

## 2022-05-27 LAB — COMPREHENSIVE METABOLIC PANEL
ALT: 7 IU/L (ref 0–32)
AST: 19 IU/L (ref 0–40)
Albumin/Globulin Ratio: 1.5 (ref 1.2–2.2)
Albumin: 4.3 g/dL (ref 3.8–4.8)
Alkaline Phosphatase: 91 IU/L (ref 44–121)
BUN/Creatinine Ratio: 10 — ABNORMAL LOW (ref 12–28)
BUN: 8 mg/dL (ref 8–27)
Bilirubin Total: 0.6 mg/dL (ref 0.0–1.2)
CO2: 25 mmol/L (ref 20–29)
Calcium: 10 mg/dL (ref 8.7–10.3)
Chloride: 99 mmol/L (ref 96–106)
Creatinine, Ser: 0.82 mg/dL (ref 0.57–1.00)
Globulin, Total: 2.9 g/dL (ref 1.5–4.5)
Glucose: 163 mg/dL — ABNORMAL HIGH (ref 70–99)
Potassium: 3.9 mmol/L (ref 3.5–5.2)
Sodium: 139 mmol/L (ref 134–144)
Total Protein: 7.2 g/dL (ref 6.0–8.5)
eGFR: 74 mL/min/{1.73_m2} (ref >59)

## 2022-05-27 LAB — HEMOGLOBIN A1C
Est. average glucose Bld gHb Est-mCnc: 151 mg/dL
Hgb A1c MFr Bld: 6.9 % — ABNORMAL HIGH (ref 4.8–5.6)

## 2022-05-27 LAB — TSH: TSH: 3.44 u[IU]/mL (ref 0.450–4.500)

## 2022-05-27 LAB — T4, FREE: Free T4: 1.38 ng/dL (ref 0.82–1.77)

## 2022-05-27 NOTE — Progress Notes (Signed)
Hello Bethany [name] ,   Some of your labwork results  are back and stable for you Your tsh improved. Please, let me know if you stopped taking your synthroid 75mg  at the day of seeing at [facility] and doing labs or when last time you took 75 mg.  Do you still have 75 mg tablets at home?  We will need to repeat TSH testing again at your next appt  Any questions please reach out to the office or message me on MyChart!  Best, [facility], PA-C

## 2022-06-01 ENCOUNTER — Ambulatory Visit: Payer: Medicare HMO | Admitting: Family Medicine

## 2022-06-16 ENCOUNTER — Encounter: Payer: Self-pay | Admitting: Podiatry

## 2022-06-16 ENCOUNTER — Ambulatory Visit: Payer: Medicare HMO | Admitting: Podiatry

## 2022-06-16 DIAGNOSIS — B351 Tinea unguium: Secondary | ICD-10-CM | POA: Diagnosis not present

## 2022-06-16 DIAGNOSIS — M79674 Pain in right toe(s): Secondary | ICD-10-CM | POA: Diagnosis not present

## 2022-06-16 DIAGNOSIS — E119 Type 2 diabetes mellitus without complications: Secondary | ICD-10-CM

## 2022-06-16 NOTE — Progress Notes (Signed)
This patient returns to my office for at risk foot care.  This patient requires this care by a professional since this patient will be at risk due to having type 2 diabetes and thrombocytopenia..  This patient is unable to cut nails herself since the patient cannot reach her nails.These nails are painful walking and wearing shoes.  This patient presents for at risk foot care today.  General Appearance  Alert, conversant and in no acute stress.  Vascular  Dorsalis pedis  are palpable  Bilaterally. Posterior tibial pulses are absent  B/L. Capillary return is within normal limits  bilaterally. Cold feet  Bilaterally.  Absent digital hair.  Neurologic  Senn-Weinstein monofilament wire test within normal limits  bilaterally. Muscle power within normal limits bilaterally.  Nails Thick disfigured discolored nails with subungual debris with incurvated nails both borders right hallux.. No evidence of bacterial infection or drainage bilaterally.  Orthopedic  No limitations of motion  feet .  No crepitus or effusions noted.  No bony pathology or digital deformities noted.  Skin  Thin skin  skin with no porokeratosis noted bilaterally.  No signs of infections or ulcers noted.     Onychomycosis  Pain in right toes  Pain in left toes  Consent was obtained for treatment procedures.   Mechanical debridement of nail both hallux. performed with a nail nipper.  Filed with dremel without incident.    Return office visit   3 months                   Told patient to return for periodic foot care and evaluation due to potential at risk complications.   Kelby Lotspeich DPM  

## 2022-06-25 ENCOUNTER — Other Ambulatory Visit: Payer: Self-pay | Admitting: Family Medicine

## 2022-06-25 DIAGNOSIS — E119 Type 2 diabetes mellitus without complications: Secondary | ICD-10-CM

## 2022-06-27 ENCOUNTER — Encounter: Payer: Self-pay | Admitting: Family Medicine

## 2022-06-27 ENCOUNTER — Ambulatory Visit (INDEPENDENT_AMBULATORY_CARE_PROVIDER_SITE_OTHER): Payer: Medicare HMO | Admitting: Family Medicine

## 2022-06-27 VITALS — BP 95/57 | HR 78 | Temp 97.9°F | Resp 12 | Wt 114.0 lb

## 2022-06-27 DIAGNOSIS — E1169 Type 2 diabetes mellitus with other specified complication: Secondary | ICD-10-CM | POA: Diagnosis not present

## 2022-06-27 DIAGNOSIS — R1030 Lower abdominal pain, unspecified: Secondary | ICD-10-CM

## 2022-06-27 DIAGNOSIS — E039 Hypothyroidism, unspecified: Secondary | ICD-10-CM | POA: Diagnosis not present

## 2022-06-27 LAB — POCT URINALYSIS DIPSTICK
Bilirubin, UA: NEGATIVE
Blood, UA: NEGATIVE
Glucose, UA: POSITIVE — AB
Ketones, UA: NEGATIVE
Leukocytes, UA: NEGATIVE
Nitrite, UA: NEGATIVE
Protein, UA: NEGATIVE
Spec Grav, UA: <=1.005 — AB (ref 1.010–1.025)
Urobilinogen, UA: 0.2 E.U./dL
pH, UA: 6 (ref 5.0–8.0)

## 2022-06-27 NOTE — Progress Notes (Signed)
I,Roshena L Chambers,acting as a scribe for [name], MD.,have documented all relevant documentation on the behalf of [name], MD,as directed by  [name], MD while in the presence of [name], MD.   Established patient visit   Patient: Bethany [name]   DOB: 08-07-1946   76 y.o. Female  MRN: [id number] Visit Date: 06/27/2022  Today's healthcare provider: [name], MD   Chief Complaint  Patient presents with   Hypothyroidism   Abdominal Pain   Ear lobe pain   Subjective    HPI  Hypothyroid, follow-up  Lab Results  Component Value Date   TSH 3.440 05/26/2022   TSH 0.021 (L) 03/14/2022   TSH 0.019 (L) 01/26/2022   FREET4 1.38 05/26/2022   FREET4 1.37 09/27/2021   T4TOTAL 10.1 05/12/2017    Wt Readings from Last 3 Encounters:  06/27/22 114 lb (51.7 kg)  05/26/22 112 lb 9.6 oz (51.1 kg)  03/29/22 110 lb (49.9 kg)    She was last seen for hypothyroid on 05/26/2022 (seen by Debera Lat, PA-C.    Management since that visit includes ordering labs. She had previously been instructed to reduce levothyroxine from 75 to , but had just made the change shortly before having blood drawn in July She reports good compliance with treatment. Patient is currently taking daily. She is not having side effects. Patient reports having increased SOB, leg swelling, abdominal swelling and fatigue within the past couple of weeks. She also reports feeling off balance when walking (often leans to the right). Patient has an appointment to see cardiologist Dr. Graciela Husbands on 07/04/2022.  She had lower abdominal pain and pressure a couple of weeks ago which she reports has improved.   Symptoms: Yes change in energy level Yes constipation  Yes diarrhea No heat / cold intolerance  No nervousness No palpitations  No weight changes    -----------------------------------------------------------------------------------------  Abdominal pain: Patient complains of  lower abdominal pain and pressure for the past couple of weeks. She states this problem has gradually improved.   Ear lobe pain: Patient complains of pain, swelling and drainage from both earlobe near her piercing.   Medications: Outpatient Medications Prior to Visit  Medication Sig   acetaminophen (TYLENOL) 500 MG tablet Take 1,000 mg by mouth 2 (two) times daily as needed for mild pain or moderate pain.   Alcohol Swabs (B-D SINGLE USE SWABS REGULAR) PADS USE TO CHECK BLOOD SUGAR DAILY   aspirin EC 81 MG tablet Take 81 mg by mouth daily.   atorvastatin (LIPITOR) 20 MG tablet TAKE 1 TABLET EVERY DAY   Blood Glucose Calibration (TRUE METRIX LEVEL 1) Low SOLN Use as directed with glucose meter   carvedilol (COREG) 3.125 MG tablet TAKE 1 TABLET TWICE DAILY WITH MEALS   cyanocobalamin 100 MCG tablet Take 100 mcg by mouth daily.   dapagliflozin propanediol (FARXIGA) 5 MG TABS tablet TAKE 1 TABLET EVERY DAY BEFORE BREAKFAST   ferrous sulfate 325 (65 FE) MG tablet Take 325 mg by mouth 2 (two) times daily with a meal.   furosemide (LASIX) 40 MG tablet Take 1 tablet (40 mg) by mouth every other day   glucose blood (TRUE METRIX BLOOD GLUCOSE TEST) test strip CHECK BLOOD SUGAR EVERY DAY   JANUMET XR [phone] MG TB24 TAKE 1 TABLET EVERY DAY   levothyroxine (SYNTHROID) 50 MCG tablet Take 1 tablet (50 mcg total) by mouth daily.   Multiple Vitamin (MULTIVITAMIN) tablet Take 1 tablet by mouth  daily.   pantoprazole (PROTONIX) 40 MG tablet TAKE 1 TABLET (40 MG TOTAL) BY MOUTH DAILY.   sacubitril-valsartan (ENTRESTO) 24-26 MG Take 0.5 tablets by mouth 2 (two) times daily.   senna (SENOKOT) 8.6 MG tablet Take 1 tablet by mouth daily as needed.   TRUEplus Lancets 33G MISC CHECK BLOOD SUGAR ONE TIME DAILY   No facility-administered medications prior to visit.    Review of Systems  Constitutional:  Positive for fatigue. Negative for appetite change, chills and fever.  HENT:         Ear lobe pain   Respiratory:  Positive for shortness of breath. Negative for chest tightness.   Cardiovascular:  Negative for chest pain and palpitations.  Gastrointestinal:  Negative for abdominal pain, nausea and vomiting.  Endocrine: Positive for cold intolerance.  Neurological:  Negative for dizziness and weakness.       Objective    BP (!) 95/57 (BP Location: Left Arm, Patient Position: Sitting, Cuff Size: Normal)   Pulse 78   Temp 97.9 F (36.6 C) (Oral)   Resp 12   Wt 114 lb (51.7 kg)   LMP  (LMP Unknown)   SpO2 99% Comment: room air  BMI 21.54 kg/m    Physical Exam   General: Appearance:    Well developed, well nourished female in no acute distress  Eyes:    PERRL, conjunctiva/corneas clear, EOM's intact       Lungs:     Clear to auscultation bilaterally, respirations unlabored  Heart:    Normal heart rate. Normal rhythm.  2/6 systolic murmur at right upper sternal border   MS:   All extremities are intact.    Neurologic:   Awake, alert, oriented x 3. No apparent focal neurological defect.         Results for orders placed or performed in visit on 06/27/22  POCT Urinalysis Dipstick  Result Value Ref Range   Color, UA yellow    Clarity, UA clear    Glucose, UA Positive (A) Negative   Bilirubin, UA negative    Ketones, UA negative    Spec Grav, UA <=1.005 (A) 1.010 - 1.025   Blood, UA negative    pH, UA 6.0 5.0 - 8.0   Protein, UA Negative Negative   Urobilinogen, UA 0.2 0.2 or 1.0 E.U./dL   Nitrite, UA negative    Leukocytes, UA Negative Negative   Appearance     Odor      Assessment & Plan     1. Lower abdominal pain Now mostly resolved. U/a negative. Call if sx return.   2. Type 2 diabetes mellitus with other specified complication, without long-term current use of insulin (HCC) Well controlled. Continue current medications.    3. Adult hypothyroidism Has just been on the lowered dose of levothyroxine ( ) for a little over a months.  - TSH - T4, free       The entirety of the information documented in the History of Present Illness, Review of Systems and Physical Exam were personally obtained by me. Portions of this information were initially documented by the CMA and reviewed by me for thoroughness and accuracy.     [name], MD  [facility] [phone] (phone) [phone] (fax)  [facility]

## 2022-06-28 ENCOUNTER — Ambulatory Visit (INDEPENDENT_AMBULATORY_CARE_PROVIDER_SITE_OTHER): Payer: Medicare HMO

## 2022-06-28 DIAGNOSIS — I428 Other cardiomyopathies: Secondary | ICD-10-CM

## 2022-06-28 LAB — CUP PACEART REMOTE DEVICE CHECK
Battery Remaining Longevity: 83 mo
Battery Voltage: 3.06 V
Brady Statistic AP VP Percent: 0.04 %
Brady Statistic AP VS Percent: 0.01 %
Brady Statistic AS VP Percent: 98.71 %
Brady Statistic AS VS Percent: 1.24 %
Brady Statistic RA Percent Paced: 0.05 %
Brady Statistic RV Percent Paced: 4.47 %
Date Time Interrogation Session: 20230822063425
HighPow Impedance: 49 Ohm
Implantable Lead Implant Date: 20160409
Implantable Lead Implant Date: 20160409
Implantable Lead Implant Date: 20160409
Implantable Lead Location: 753858
Implantable Lead Location: 753859
Implantable Lead Location: 753860
Implantable Lead Model: 4598
Implantable Lead Model: 5076
Implantable Pulse Generator Implant Date: 20230523
Lead Channel Impedance Value: 1007 Ohm
Lead Channel Impedance Value: 1178 Ohm
Lead Channel Impedance Value: 1197 Ohm
Lead Channel Impedance Value: 1254 Ohm
Lead Channel Impedance Value: 249.509
Lead Channel Impedance Value: 267.31 Ohm
Lead Channel Impedance Value: 285 Ohm
Lead Channel Impedance Value: 319.42 Ohm
Lead Channel Impedance Value: 342 Ohm
Lead Channel Impedance Value: 349.189
Lead Channel Impedance Value: 361 Ohm
Lead Channel Impedance Value: 456 Ohm
Lead Channel Impedance Value: 456 Ohm
Lead Channel Impedance Value: 551 Ohm
Lead Channel Impedance Value: 646 Ohm
Lead Channel Impedance Value: 760 Ohm
Lead Channel Impedance Value: 950 Ohm
Lead Channel Impedance Value: 950 Ohm
Lead Channel Pacing Threshold Amplitude: 0.375 V
Lead Channel Pacing Threshold Amplitude: 0.75 V
Lead Channel Pacing Threshold Amplitude: 2 V
Lead Channel Pacing Threshold Pulse Width: 0.4 ms
Lead Channel Pacing Threshold Pulse Width: 0.4 ms
Lead Channel Pacing Threshold Pulse Width: 1 ms
Lead Channel Sensing Intrinsic Amplitude: 0.25 mV
Lead Channel Sensing Intrinsic Amplitude: 0.25 mV
Lead Channel Sensing Intrinsic Amplitude: 8.75 mV
Lead Channel Sensing Intrinsic Amplitude: 8.75 mV
Lead Channel Setting Pacing Amplitude: 1.5 V
Lead Channel Setting Pacing Amplitude: 2 V
Lead Channel Setting Pacing Amplitude: 2.5 V
Lead Channel Setting Pacing Pulse Width: 0.4 ms
Lead Channel Setting Pacing Pulse Width: 1 ms
Lead Channel Setting Sensing Sensitivity: 0.3 mV

## 2022-06-28 LAB — TSH: TSH: 2.52 u[IU]/mL (ref 0.450–4.500)

## 2022-06-28 LAB — T4, FREE: Free T4: 1.32 ng/dL (ref 0.82–1.77)

## 2022-07-05 ENCOUNTER — Encounter: Payer: Self-pay | Admitting: Internal Medicine

## 2022-07-05 ENCOUNTER — Ambulatory Visit: Payer: Medicare HMO | Attending: Internal Medicine | Admitting: Internal Medicine

## 2022-07-05 ENCOUNTER — Other Ambulatory Visit
Admission: RE | Admit: 2022-07-05 | Discharge: 2022-07-05 | Disposition: A | Discharge status: Home / Self Care | Payer: Medicare HMO | Source: Ambulatory Visit | Attending: Internal Medicine | Admitting: Internal Medicine

## 2022-07-05 VITALS — BP 114/70 | HR 96 | Ht 61.0 in | Wt 116.0 lb

## 2022-07-05 DIAGNOSIS — I5022 Chronic systolic (congestive) heart failure: Secondary | ICD-10-CM

## 2022-07-05 DIAGNOSIS — I447 Left bundle-branch block, unspecified: Secondary | ICD-10-CM | POA: Diagnosis not present

## 2022-07-05 DIAGNOSIS — Z95810 Presence of automatic (implantable) cardiac defibrillator: Secondary | ICD-10-CM

## 2022-07-05 DIAGNOSIS — I428 Other cardiomyopathies: Secondary | ICD-10-CM | POA: Diagnosis not present

## 2022-07-05 DIAGNOSIS — R079 Chest pain, unspecified: Secondary | ICD-10-CM | POA: Insufficient documentation

## 2022-07-05 LAB — TROPONIN I (HIGH SENSITIVITY): Troponin I (High Sensitivity): 6 ng/L (ref <18)

## 2022-07-05 NOTE — Progress Notes (Signed)
ELECTROPHYSIOLOGY Clinic  NOTE  Patient ID: Bethany [name], MRN: [id number], DOB/AGE: 05/03/1946 76 y.o. Admit date: (Not on file) Date of Consult: 07/05/2022  Primary Physician: Birdie Sons, MD Primary Cardiologist: BK   Chief Complaint: Defibrillator   HPI Bethany [name] is a 76 y.o. female in follow-up for Medtronic CRT-D implantation 4/16 for nonischemic cardiomyopathy and her device  reached ERI and underwent generator replacement 5/23  Functional status has been stable following generator replacement until yesterday.  She had an episode while sitting in the car diaphoresis chest discomfort shortness of breath and some nausea lasted about an hour and a half.  No significant residua.    Marland Kitchen   DATE TEST EF   2012 Echo  20-25%   2012 LHC  Normal CAs  3/16 Echo   15-20 %   3/17 Echo  35%   4/21 Echo ([place]) 50%   4/21 Myoview   No perfusion defects      Date  Cr K Hgb TSH  5/19 .75 3.8     8/21 0.84 3.8    6/22 0.89 4.1 12.2   3/23 0.79 3.9 12.1 (11/22) 0.019< 5.14 (11/22)  7/23 0.82 3.9  2.52(8/23)     Past Medical History:  Diagnosis Date   AICD (automatic cardioverter/defibrillator) present    Anemia    Anxiety    CHF (congestive heart failure) (HCC)    Complication of anesthesia    Depression    Diabetes ([place])    Dilated cardiomyopathy (HCC)    Diverticulosis    Dyspnea    Dysrhythmia    Edema    FEET/ANKLES OCCAS   GERD (gastroesophageal reflux disease)    Hyperlipidemia    Hypothyroidism    LV dysfunction    Orthopnea    USES 3 PILLOWS   PONV (postoperative nausea and vomiting)    Reflux    Thrombocytopenia (HCC)    Thyroid disease    VHD (valvular heart disease)       Surgical History:  Past Surgical History:  Procedure Laterality Date   ABDOMINAL HYSTERECTOMY     BI-VENTRICULAR IMPLANTABLE CARDIOVERTER DEFIBRILLATOR [name] 02/11/2015   Procedure: BI-VENTRICULAR IMPLANTABLE CARDIOVERTER DEFIBRILLATOR  (CRT-D);  Surgeon: Deboraha Sprang,  MD;  Location: [facility] CATH LAB;  Service: Cardiovascular;  Laterality: [name];   BONE MARROW BIOPSY  2007   spine   BREAST BIOPSY Right    neg   CARDIAC CATHETERIZATION     CATARACT EXTRACTION W/PHACO Right 01/03/2017   Procedure: CATARACT EXTRACTION PHACO AND INTRAOCULAR LENS PLACEMENT (IOC);  Surgeon: Leandrew Koyanagi, MD;  Location: [facility] ORS;  Service: Ophthalmology;  Laterality: Right;  [place] 00:50 AP% 19.5 CDE 9.94 fluid pack lot # [id number]   CATARACT EXTRACTION W/PHACO Left 10/27/2016   Procedure: CATARACT EXTRACTION PHACO AND INTRAOCULAR LENS PLACEMENT (IOC);  Surgeon: Leandrew Koyanagi, MD;  Location: [facility] ORS;  Service: Ophthalmology;  Laterality: Left;  [place]  01:12 AP%  21.0 CDE  11.59 Fluid pack lot # [id number]   CHOLECYSTECTOMY     ICD GENERATOR CHANGEOUT [name] 03/29/2022   Procedure: ICD GENERATOR CHANGEOUT;  Surgeon: Deboraha Sprang, MD;  Location: [place] CV LAB;  Service: Cardiovascular;  Laterality: [name];   INSERT / REPLACE / [place]   LEAD REVISION [name] 02/11/2015   Procedure: LEAD REVISION;  Surgeon: Deboraha Sprang, MD;  Location: [facility] CATH LAB;  Service: Cardiovascular;  Laterality: [name];   PARTIAL HYSTERECTOMY  1977  TOOTH EXTRACTION  09/23/2015     Home Meds: Current Outpatient Medications on File Prior to Visit  Medication Sig Dispense Refill   acetaminophen (TYLENOL) 500 MG tablet Take 1,000 mg by mouth 2 (two) times daily as needed for mild pain or moderate pain.     Alcohol Swabs (B-D SINGLE USE SWABS REGULAR) PADS USE TO CHECK BLOOD SUGAR DAILY 100 each 4   aspirin EC 81 MG tablet Take 81 mg by mouth daily.     atorvastatin (LIPITOR) 20 MG tablet TAKE 1 TABLET EVERY DAY 90 tablet 4   Blood Glucose Calibration (TRUE METRIX LEVEL 1) Low SOLN Use as directed with glucose meter 1 each 3   carvedilol (COREG) 3.125 MG tablet TAKE 1 TABLET TWICE DAILY WITH MEALS 180 tablet 4   cyanocobalamin 100 MCG tablet Take 100 mcg by mouth daily.     dapagliflozin  propanediol (FARXIGA) 5 MG TABS tablet TAKE 1 TABLET EVERY DAY BEFORE BREAKFAST 90 tablet 3   ferrous sulfate 325 (65 FE) MG tablet Take 325 mg by mouth 2 (two) times daily with a meal.     furosemide (LASIX) 40 MG tablet Take 1 tablet (40 mg) by mouth every other day 30 tablet    glucose blood (TRUE METRIX BLOOD GLUCOSE TEST) test strip CHECK BLOOD SUGAR EVERY DAY 100 strip 3   JANUMET XR [phone] MG TB24 TAKE 1 TABLET EVERY DAY 90 tablet 4   levothyroxine (SYNTHROID) 50 MCG tablet Take 1 tablet (50 mcg total) by mouth daily. 90 tablet 3   Multiple Vitamin (MULTIVITAMIN) tablet Take 1 tablet by mouth daily.     pantoprazole (PROTONIX) 40 MG tablet TAKE 1 TABLET (40 MG TOTAL) BY MOUTH DAILY. 90 tablet 4   sacubitril-valsartan (ENTRESTO) 24-26 MG Take 0.5 tablets by mouth 2 (two) times daily.     senna (SENOKOT) 8.6 MG tablet Take 1 tablet by mouth daily as needed.     TRUEplus Lancets 33G MISC CHECK BLOOD SUGAR ONE TIME DAILY 100 each 4   No current facility-administered medications on file prior to visit.      Allergies:  Allergies  Allergen Reactions   Other Other (See Comments)    NO BLOOD PRODUCTS- JEHOVAHS WITNESS   Sulfa Antibiotics Other (See Comments)    Loss of taste       ROS:  Please see the history of present illness.     All other systems reviewed and negative.   BP 114/70 (BP Location: Left Arm, Patient Position: Sitting, Cuff Size: Normal)   Pulse 96   Ht _0  (1.549 m)   Wt 116 lb (52.6 kg)   LMP  (LMP Unknown)   SpO2 96%   BMI 21.92 kg/m  Well developed and well nourished in no acute distress HENT normal Neck supple with JVP-flat Clear Device pocket well healed; without hematoma or erythema.  There is no tethering  Regular rate and rhythm, no  murmur Abd-soft with active BS No Clubbing cyanosis  edema Skin-warm and dry A & Oriented  Grossly normal sensory and motor function  ECG P synchronous pacing QRS 118 ms    Assessment and Plan:   NICM--  interval improvement  HFrecEF class IIb  LBBB  CRT-D-Medtronic implanted 4/16   Stable dyspnea.  Episode yesterday concerning although relatively low pretest likelihood for an MI given her previously identified nonischemic cardiomyopathy.  We will check troponin  The cardiomyopathy continue her Entresto 24/26, carvedilol 3.125 and Farxiga 5.  Up titration  limited by blood pressure  Euvolemic.  Continue furosemide 40 q. OD   Virl Axe

## 2022-07-05 NOTE — Patient Instructions (Addendum)
Medication Instructions:  - Your physician recommends that you continue on your current medications as directed. Please refer to the Current Medication list given to you today.  *If you need a refill on your cardiac medications before your next appointment, please call your pharmacy*   Lab Work: - Your physician recommends that you have lab work today:  Troponin  Sales executive at [facility] 1st desk on the right to check in (REGISTRATION)  Lab hours: Monday- Friday (7:30 am- 5:30 pm)  If you have labs (blood work) drawn today and your tests are completely normal, you will receive your results only by: MyChart Message (if you have MyChart) OR A paper copy in the mail If you have any lab test that is abnormal or we need to change your treatment, we will call you to review the results.   Testing/Procedures: - none ordered   Follow-Up: At [facility], you and your health needs are our priority.  As part of our continuing mission to provide you with exceptional heart care, we have created designated Provider Care Teams.  These Care Teams include your primary Cardiologist (physician) and Advanced Practice Providers (APPs -  Physician Assistants and Nurse Practitioners) who all work together to provide you with the care you need, when you need it.  We recommend signing up for the patient portal called "MyChart".  Sign up information is provided on this After Visit Summary.  MyChart is used to connect with patients for Virtual Visits (Telemedicine).  Patients are able to view lab/test results, encounter notes, upcoming appointments, etc.  Non-urgent messages can be sent to your provider as well.   To learn more about what you can do with MyChart, go to [email or web address].    Your next appointment:   9 month(s)  The format for your next appointment:   In Person  Provider:   [name], MD    Other Instructions [name]  Important Information About Sugar

## 2022-07-05 NOTE — Progress Notes (Signed)
Ekg

## 2022-07-08 ENCOUNTER — Telehealth: Payer: Medicare HMO

## 2022-07-20 ENCOUNTER — Other Ambulatory Visit: Payer: Self-pay | Admitting: Family Medicine

## 2022-07-20 DIAGNOSIS — Z1231 Encounter for screening mammogram for malignant neoplasm of breast: Secondary | ICD-10-CM

## 2022-07-26 ENCOUNTER — Telehealth: Payer: Self-pay

## 2022-07-26 ENCOUNTER — Telehealth: Payer: Medicare HMO

## 2022-07-26 ENCOUNTER — Telehealth: Payer: Self-pay | Admitting: Family Medicine

## 2022-07-26 NOTE — Telephone Encounter (Signed)
Copied from CRM #[id number]. Topic: Appointment Scheduling - Scheduling Inquiry for Clinic >> Jul 26, 2022  9:17 AM Everette C wrote: Reason for CRM: The patient would like to speak with Alex when possible for their phone appt   Please contact the patient further regarding contact or rescheduling 

## 2022-07-26 NOTE — Progress Notes (Deleted)
Chronic Care Management Pharmacy Note  07/26/2022 Name:  Bethany [name] MRN:  [id number] DOB:  [birth year]  Summary: Patient presents for CCM follow-up. Urinary frequency, urinary urgency.   Recommendations/Changes made from today's visit: Continue current medications  Plan: CPP follow-up 6 months  Subjective: Bethany [name] is an 76 y.o. year old female who is a primary patient of Fisher, Kirstie Peri, MD.  The CCM team was consulted for assistance with disease management and care coordination needs.    Engaged with patient by telephone for follow up visit in response to provider referral for pharmacy case management and/or care coordination services.   Consent to Services:  The patient was given information about Chronic Care Management services, agreed to services, and gave verbal consent prior to initiation of services.  Please see initial visit note for detailed documentation.   Patient Care Team: Birdie Sons, MD as PCP - General (Family Medicine) Corey Skains, MD as Consulting Physician (Cardiology) Deboraha Sprang, MD as Consulting Physician (Cardiology) Leandrew Koyanagi, MD as Referring Physician (Ophthalmology) Eulogio Bear, MD as Consulting Physician (Ophthalmology) Gardiner Barefoot, DPM as Consulting Physician (Podiatry) Germaine Pomfret, [facility] (Pharmacist)  Recent office visits: 06/27/22: Patient presented to Dr. Caryn Section for follow-up.    Recent consult visits: 07/05/22: Patient presented to Dr. Caryl Comes (cardiology) for follow-up.   Hospital visits: 11/26/21: Patient presented to [facility] for lumbar strain. Cyclobenzaprine + lidocaine patches.    Objective:  Lab Results  Component Value Date   CREATININE 0.82 05/26/2022   BUN 8 05/26/2022   GFR 88.96 02/05/2015   GFRNONAA >60 03/14/2022   GFRAA 79 07/01/2020   NA 139 05/26/2022   K 3.9 05/26/2022   CALCIUM 10.0 05/26/2022   CO2 25 05/26/2022   GLUCOSE 163 (H) 05/26/2022    Lab Results   Component Value Date/Time   HGBA1C 6.9 (H) 05/26/2022 10:19 AM   HGBA1C 6.9 (H) 01/26/2022 10:11 AM   HGBA1C 6.3 02/20/2014 07:57 PM   GFR 88.96 02/05/2015 10:34 AM   MICROALBUR negative 06/02/2020 10:36 AM   MICROALBUR negative 12/04/2018 11:50 AM    Last diabetic Eye exam:  Lab Results  Component Value Date/Time   HMDIABEYEEXA No Retinopathy 03/10/2020 12:00 AM    Last diabetic Foot exam: No results found for: "HMDIABFOOTEX"   Lab Results  Component Value Date   CHOL 120 01/13/2021   HDL 55 01/13/2021   LDLCALC 49 01/13/2021   TRIG 79 01/13/2021   CHOLHDL 2.2 01/13/2021       Latest Ref Rng & Units 05/26/2022   10:19 AM 01/26/2022   10:11 AM 09/27/2021    2:19 PM  Hepatic Function  Total Protein 6.0 - 8.5 g/dL 7.2  6.6  6.8   Albumin 3.8 - 4.8 g/dL 4.3  4.2  4.2   AST 0 - 40 IU/L _0 ALT 0 - 32 IU/L _1 Alk Phosphatase 44 - 121 IU/L 91  104  89   Total Bilirubin 0.0 - 1.2 mg/dL 0.6  0.3  0.2     Lab Results  Component Value Date/Time   TSH 2.520 06/27/2022 10:56 AM   TSH 3.440 05/26/2022 10:19 AM   FREET4 1.32 06/27/2022 10:56 AM   FREET4 1.38 05/26/2022 10:19 AM       Latest Ref Rng & Units 05/26/2022   10:19 AM 03/14/2022    2:39 PM 09/27/2021    2:19 PM  CBC  WBC 3.4 - 10.8 x10E3/uL 4.5  3.8  3.6   Hemoglobin 11.1 - 15.9 g/dL 12.6  12.0  12.1   Hematocrit 34.0 - 46.6 % 37.6  36.8  35.8   Platelets 150 - 450 x10E3/uL 104  96  100     Lab Results  Component Value Date/Time   VD25OH 50.6 01/26/2022 10:11 AM   VD25OH 99.0 01/13/2021 08:15 AM    Clinical ASCVD: No  The ASCVD Risk score (Arnett DK, et al., 2019) failed to calculate for the following reasons:   The valid total cholesterol range is 130 to 320 mg/dL       05/24/2022    1:07 PM 12/16/2021    3:38 PM 04/12/2021   10:22 AM  Depression screen PHQ 2/9  Decreased Interest 0 0 0  Down, Depressed, Hopeless 0 0 0  PHQ - 2 Score 0 0 0  Altered sleeping   3  Tired, decreased  energy   3  Change in appetite   3  Feeling bad or failure about yourself    0  Trouble concentrating   3  Moving slowly or fidgety/restless   0  Suicidal thoughts   0  PHQ-9 Score   12  Difficult doing work/chores   Not difficult at all    -Last DEXA Scan: 02/05/20   T-Score femoral neck: -1.8  T-Score total hip: -1.3  T-Score lumbar spine: -1.4  T-Score forearm radius: -0.8  10-year probability of major osteoporotic fracture: 8.1%  10-year probability of hip fracture: 1.6%  Social History   Tobacco Use  Smoking Status Never  Smokeless Tobacco Former   Types: Snuff   Quit date: 10/27/1969   BP Readings from Last 3 Encounters:  07/05/22 114/70  06/27/22 (!) 95/57  05/26/22 99/66   Pulse Readings from Last 3 Encounters:  07/05/22 96  06/27/22 78  05/26/22 84   Wt Readings from Last 3 Encounters:  07/05/22 116 lb (52.6 kg)  06/27/22 114 lb (51.7 kg)  05/26/22 112 lb 9.6 oz (51.1 kg)   BMI Readings from Last 3 Encounters:  07/05/22 21.92 kg/m  06/27/22 21.54 kg/m  05/26/22 21.28 kg/m    Assessment/Interventions: Review of patient past medical history, allergies, medications, health status, including review of consultants reports, laboratory and other test data, was performed as part of comprehensive evaluation and provision of chronic care management services.   SDOH:  (Social Determinants of Health) assessments and interventions performed: Yes SDOH Interventions    Flowsheet Row Telephone from 04/27/2022 in [place] Office Visit from 01/26/2022 in [place] from 12/16/2021 in [place] Management from 07/02/2021 in [place] Management from 12/04/2020 in [place] Visit from 09/14/2020 in [place] Interventions Other (Comment)  [Spoke with patient  about meals on wheels, but she is not interested at this time.  She is interested in having a list of food pantries emailed to [email or web address]. She has already received her Mom's Meals benefit post discharge.] -- Intervention Not Indicated -- -- --  Housing Interventions -- -- Intervention Not Indicated -- -- --  Transportation Interventions -- -- Intervention Not Indicated -- Intervention Not Indicated --  Depression Interventions/Treatment  -- Patient refuses Treatment -- -- -- Counseling  Financial Strain Interventions -- -- Intervention Not Indicated Intervention Not Indicated Intervention Not Indicated --  Physical Activity Interventions -- -- Intervention Not Indicated -- -- --  Stress Interventions -- -- Intervention Not Indicated -- -- --  Social Connections Interventions -- -- Intervention Not Indicated -- -- --       SDOH Screenings   Food Insecurity: No Food Insecurity (05/24/2022)  Housing: Low Risk  (05/24/2022)  Transportation Needs: No Transportation Needs (05/24/2022)  Alcohol Screen: Low Risk  (05/24/2022)  Depression (PHQ2-9): Low Risk  (05/24/2022)  Financial Resource Strain: Low Risk  (05/24/2022)  Physical Activity: Insufficiently Active (12/16/2021)  Social Connections: Moderately Integrated (05/24/2022)  Stress: No Stress Concern Present (05/24/2022)  Tobacco Use: Medium Risk (07/05/2022)    CCM Care Plan  Allergies  Allergen Reactions   Other Other (See Comments)    NO BLOOD PRODUCTS- JEHOVAHS WITNESS   Sulfa Antibiotics Other (See Comments)    Loss of taste    Medications Reviewed Today     Reviewed by Raelene Bott, [place] Rocker (Certified Medical Assistant) on 07/05/22 at [place] List Status: <None>   Medication Order Taking? Sig Documenting Provider Last Dose Status Informant  acetaminophen (TYLENOL) 500 MG tablet [id number] Yes Take 1,000 mg by mouth 2 (two) times daily as needed for mild pain or moderate pain. [provider] Taking  Active   Alcohol Swabs (B-D SINGLE USE SWABS REGULAR) PADS [id number] Yes USE TO CHECK BLOOD SUGAR DAILY Birdie Sons, MD Taking Active   aspirin EC 81 MG tablet [id number] Yes Take 81 mg by mouth daily. [provider] Taking Active Self  atorvastatin (LIPITOR) 20 MG tablet [id number] Yes TAKE 1 TABLET EVERY DAY Birdie Sons, MD Taking Active   Blood Glucose Calibration (TRUE METRIX LEVEL 1) Low SOLN [id number] Yes Use as directed with glucose meter Birdie Sons, MD Taking Active   carvedilol (COREG) 3.125 MG tablet [id number] Yes TAKE 1 TABLET TWICE DAILY WITH MEALS Birdie Sons, MD Taking Active   cyanocobalamin 100 MCG tablet [id number] Yes Take 100 mcg by mouth daily. [provider] Taking Active   dapagliflozin propanediol (FARXIGA) 5 MG TABS tablet [id number] Yes TAKE 1 TABLET EVERY DAY BEFORE BREAKFAST Fisher, Kirstie Peri, MD Taking Active   ferrous sulfate 325 (65 FE) MG tablet [id number] Yes Take 325 mg by mouth 2 (two) times daily with a meal. [provider] Taking Active Self  furosemide (LASIX) 40 MG tablet [id number] Yes Take 1 tablet (40 mg) by mouth every other day Deboraha Sprang, MD Taking Active   glucose blood (TRUE METRIX BLOOD GLUCOSE TEST) test strip [id number] Yes CHECK BLOOD SUGAR EVERY DAY Birdie Sons, MD Taking Active   JANUMET XR [phone] MG TB24 [id number] Yes TAKE 1 TABLET EVERY DAY Birdie Sons, MD Taking Active   levothyroxine (SYNTHROID) 50 MCG tablet [id number] Yes Take 1 tablet (50 mcg total) by mouth daily. Mardene Speak, PA-C Taking Active   Multiple Vitamin (MULTIVITAMIN) tablet [id number] Yes Take 1 tablet by mouth daily. [provider] Taking Active Self  pantoprazole (PROTONIX) 40 MG tablet [id number] Yes TAKE 1 TABLET (40 MG TOTAL) BY MOUTH DAILY. Birdie Sons, MD Taking Active   sacubitril-valsartan [facility]) 24-26 MG [id number] Yes Take 0.5 tablets by mouth 2 (two) times daily. [provider]  Taking Active   senna (SENOKOT) 8.6 MG tablet [id number] Yes Take 1 tablet by mouth daily as needed. [provider] Taking Active   TRUEplus [place] [id number] Yes CHECK BLOOD SUGAR ONE TIME DAILY Caryn Section Kirstie Peri, MD Taking  Active             Patient Active Problem List   Diagnosis Date Noted   Hospital discharge follow-up 11/29/2021   Acute bilateral low back pain without sciatica 11/29/2021   Excessive daytime sleepiness 02/18/2021   Presence of automatic implantable cardioverter-defibrillator 02/18/2021   Thrombocytopenia ([place]) 06/11/2019   Pain due to onychomycosis of toenail of right foot 05/30/2019   Memory loss 07/12/2016   GERD (gastroesophageal reflux disease) 08/24/2015   Automatic implantable cardioverter-defibrillator in situ 07/29/2015   Hypotension 06/18/2015   Absolute anemia 03/11/2015   Abnormal liver enzymes 03/11/2015   Decreased potassium in the blood 03/11/2015   Hypercholesteremia 03/11/2015   Adult hypothyroidism 03/11/2015   Idiopathic insomnia 03/11/2015   OP (osteoporosis) 03/11/2015   Diabetes mellitus, type 2 ([place]) 03/11/2015   Vitamin D deficiency 03/11/2015   LBBB (left bundle branch block) 41/32/4401   Chronic systolic heart failure ([place]) 09/25/2014   Heart valve disease 04/10/2014    Immunization History  Administered Date(s) Administered   Fluad Quad(high Dose 65+) 07/22/2019, 09/14/2020   Influenza, High Dose Seasonal PF 07/07/2016, 10/09/2017, 08/01/2018, 08/21/2021   Influenza,inj,Quad PF,6+ Mos 08/14/2015   PFIZER Comirnaty(Gray Top)Covid-19 Tri-Sucrose Vaccine 01/02/2020, 01/07/2020, 08/24/2020, 08/30/2021   PFIZER(Purple Top)SARS-COV-2 Vaccination 12/13/2019, 01/07/2020   Pneumococcal Conjugate-13 09/18/2014   Pneumococcal Polysaccharide-23 09/11/2015    Conditions to be addressed/monitored:  Hypertension, Hyperlipidemia, Diabetes, Heart Failure, GERD, Hypothyroidism, and Osteoporosis  There are no care plans  that you recently modified to display for this patient.    Medication Assistance: None required.  Patient affirms current coverage meets needs.  Compliance/Adherence/Medication fill history: Care Gaps: Tetanus Shingrix Covid Vaccine Ophthalmology  Influenza  Star-Rating Drugs: Atorvastatin 20 mg: Last filled 05/05/21 for 90-DS Janumet [phone] mg: Last filled 05/05/21 for 90-DS  Entresto 24-26 mg: Last filled 05/17/21 for 90-DS  Patient's preferred pharmacy is:  [facility] [place], [address] [place] [address] Phone: [phone] Fax: [phone]  [facility] [place], [address] [place] [address] Phone: [phone] Fax: [phone]   Uses pill box? Yes Pt endorses 100% compliance  We discussed: Current pharmacy is preferred with insurance plan and patient is satisfied with pharmacy services Patient decided to: Continue current medication management strategy  Care Plan and Follow Up Patient Decision:  Patient agrees to Care Plan and Follow-up.  Plan: Telephone follow up appointment with care management team member scheduled for:  07/08/2022 at 8:30 AM  Junius Argyle, PharmD, BCACP, [place] [phone]   Current Barriers:  No barriers noted  Pharmacist Clinical Goal(s):  Patient will maintain control of diabetes as evidenced by A1c less than 8%  maintain control of heart failure as evidenced by stable weight, lack of exacerbations through collaboration with PharmD and provider.   Interventions: 1:1 collaboration with Birdie Sons, MD regarding development and update of comprehensive plan of care as evidenced by provider attestation and co-signature Inter-disciplinary care team collaboration (see longitudinal plan of care) Comprehensive medication review performed; medication  list updated in electronic medical record  Heart Failure (Goal: manage symptoms and prevent exacerbations) -Controlled -Last ejection fraction: 35% (Date: Mar 2017) -HF type: Systolic -NYHA Class: II (slight limitation of activity) -AHA HF Stage: C (Heart disease and symptoms present) -Current treatment: Carvedilol 3.125 mg twice daily: Appropriate, Effective, Safe,  Accessible  Furosemide 40 mg 1/2 tablet daily: Appropriate, Effective, Safe, Accessible   Entresto 24-26 1/2 tablet twice daily: Appropriate, Effective, Safe, Accessible   -Medications previously tried: NA  -Current home blood pressure readings: 720T systolic's -Educated on Importance of weighing daily; if you gain more than 3 pounds in one day or 5 pounds in one week, contact cardiology office  -Recommended to continue current medication  Hyperlipidemia: (LDL goal < 100) -Controlled -Current treatment: Atorvastatin 20 mg daily  -Medications previously tried: NA  -Recommended to continue current medication  Diabetes (A1c goal <8%) -Controlled -Current medications: Farxiga 5 mg daily: Appropriate, Effective, Safe, Accessible   Janumet [phone] mg daily: Appropriate, Effective, Safe, Accessible   -Medications previously tried: NA  -Current home glucose readings: 104, 97, 90, 100, 87, 102, 95, 99  -Denies hypoglycemic/hyperglycemic symptoms -Current meal patterns: Trying to minimize salt intake, carbohydrate intake  -Current exercise: Trying to get back into walking with sister -Recommended to continue current medication  Osteopenia (Goal Prevent bone fractures) -Controlled -Patient is not a candidate for pharmacologic treatment -Current treatment  None -Medications previously tried: NA  -Recommend [phone] units of vitamin D daily. -Recommended to continue current medication  Hypothyroidism (Goal: Maintain stable thyroid function) -Controlled -Current treatment  Synthroid 75 mcg daily  -Medications  previously tried: NA  -Recommended to continue current medication  GERD (Goal: Prevent heartburn) -Controlled -Current treatment  Pantoprazole 40 mg daily -Medications previously tried: NA  -Recommended to continue current medication  Patient Goals/Self-Care Activities Patient will:  - check glucose daily before breakfast, document, and provide at future appointments check blood pressure weekly, document, and provide at future appointments weigh daily, and contact provider if weight gain of greater than 2 pounds in 24 hours   Follow Up Plan: ***

## 2022-07-26 NOTE — Progress Notes (Signed)
Remote ICD transmission.   

## 2022-07-26 NOTE — Telephone Encounter (Signed)
Copied from [place] [id number]. Topic: Appointment Scheduling - Scheduling Inquiry for Clinic >> Jul 26, 2022  9:17 AM Bethany [name] wrote: Reason for CRM: The patient would like to speak with Cristie Hem when possible for their phone appt   Please contact the patient further regarding contact or rescheduling

## 2022-07-27 ENCOUNTER — Other Ambulatory Visit: Payer: Self-pay | Admitting: Family Medicine

## 2022-07-27 DIAGNOSIS — E119 Type 2 diabetes mellitus without complications: Secondary | ICD-10-CM

## 2022-07-29 ENCOUNTER — Ambulatory Visit: Payer: Self-pay

## 2022-07-29 NOTE — Telephone Encounter (Signed)
She doesn't need pneumonia vaccine, she is up to date. She does need flu, covid and tetanus vaccines.

## 2022-07-29 NOTE — Telephone Encounter (Signed)
  Chief Complaint: Question regarding vaccines Symptoms:  Frequency:  Pertinent Negatives: Patient denies  Disposition: [] ED /[] Urgent Care (no appt availability in office) / [] Appointment(In office/virtual)/ []  [facility]/ [] Home Care/ [] Refused Recommended Disposition /[] [place] [facility]/  X Follow-up with PCP Additional Notes: Pt is calling to ask if ok to receive the following vaccines: tetanus, pneumonia, flu, and covid 19 booster. Please advise     Summary: vaccine Advice   Pt is calling to ask if ok to receive the following vaccines: tetanus, pneumonia, flu, and covid 19 booster. Please advise     Reason for Disposition  [1] Caller requesting NON-URGENT health information AND [2] PCP's office is the best resource  Answer Assessment - Initial Assessment Questions 1. REASON FOR CALL or QUESTION: "What is your reason for calling today?" or "How can I best help you?" or "What question do you have that I can help answer?"     Pt would like to know is she should have several different vaccines.  Protocols used: Information Only Call - No Triage-A-AH

## 2022-07-29 NOTE — Telephone Encounter (Signed)
Tried calling patient. Left message to call back. OK for PEC triage to advise.  ?

## 2022-07-29 NOTE — Telephone Encounter (Signed)
Called, left vm to call back to discuss vaccines needed.

## 2022-08-01 NOTE — Telephone Encounter (Signed)
Tried calling patient. Left message to call back. OK for PEC triage to advise.  ?

## 2022-08-01 NOTE — Telephone Encounter (Signed)
Yes, I recommend that she get the RSV vaccine due to her heart problems.

## 2022-08-01 NOTE — Telephone Encounter (Signed)
Patient advised. She also wants to know if Dr. Caryn Section recommends that she get the RSV vaccine?

## 2022-08-02 NOTE — Telephone Encounter (Signed)
Called, advised patient of provider's recommendations to get the RSV vaccine. Patient verbalize understanding.

## 2022-08-08 ENCOUNTER — Ambulatory Visit (INDEPENDENT_AMBULATORY_CARE_PROVIDER_SITE_OTHER): Payer: Medicare HMO

## 2022-08-08 DIAGNOSIS — E7800 Pure hypercholesterolemia, unspecified: Secondary | ICD-10-CM

## 2022-08-08 DIAGNOSIS — E1169 Type 2 diabetes mellitus with other specified complication: Secondary | ICD-10-CM

## 2022-08-08 DIAGNOSIS — E039 Hypothyroidism, unspecified: Secondary | ICD-10-CM

## 2022-08-08 MED ORDER — LEVOTHYROXINE SODIUM 50 MCG PO TABS: 50.0000 ug | ORAL_TABLET | Freq: Every day | ORAL | 3 refills | Status: DC

## 2022-08-08 NOTE — Progress Notes (Signed)
Chronic Care Management Pharmacy Note  08/09/2022 Name:  DENISE WASHBURN MRN:  [id number] DOB:  01-24-1946  Summary: Patient presents for CCM follow-up. Urinary frequency, urinary urgency.   Recommendations/Changes made from today's visit: Continue current medications  Plan: CPP follow-up 6 months  Subjective: MARGET OUTTEN is an 76 y.o. year old female who is a primary patient of Fisher, Kirstie Peri, MD.  The CCM team was consulted for assistance with disease management and care coordination needs.    Engaged with patient by telephone for follow up visit in response to provider referral for pharmacy case management and/or care coordination services.   Consent to Services:  The patient was given information about Chronic Care Management services, agreed to services, and gave verbal consent prior to initiation of services.  Please see initial visit note for detailed documentation.   Patient Care Team: Birdie Sons, MD as PCP - General (Family Medicine) Corey Skains, MD as Consulting Physician (Cardiology) Deboraha Sprang, MD as Consulting Physician (Cardiology) Leandrew Koyanagi, MD as Referring Physician (Ophthalmology) Eulogio Bear, MD as Consulting Physician (Ophthalmology) Gardiner Barefoot, DPM as Consulting Physician (Podiatry) Germaine Pomfret, [facility] (Pharmacist)  Recent office visits: 06/27/22: Patient presented to Dr. Caryn Section for follow-up.    Recent consult visits: 07/05/22: Patient presented to Dr. Caryl Comes (cardiology) for follow-up.   Hospital visits: 11/26/21: Patient presented to [facility] for lumbar strain. Cyclobenzaprine + lidocaine patches.    Objective:  Lab Results  Component Value Date   CREATININE 0.82 05/26/2022   BUN 8 05/26/2022   GFR 88.96 02/05/2015   GFRNONAA >60 03/14/2022   GFRAA 79 07/01/2020   NA 139 05/26/2022   K 3.9 05/26/2022   CALCIUM 10.0 05/26/2022   CO2 25 05/26/2022   GLUCOSE 163 (H) 05/26/2022    Lab Results   Component Value Date/Time   HGBA1C 6.9 (H) 05/26/2022 10:19 AM   HGBA1C 6.9 (H) 01/26/2022 10:11 AM   HGBA1C 6.3 02/20/2014 07:57 PM   GFR 88.96 02/05/2015 10:34 AM   MICROALBUR negative 06/02/2020 10:36 AM   MICROALBUR negative 12/04/2018 11:50 AM    Last diabetic Eye exam:  Lab Results  Component Value Date/Time   HMDIABEYEEXA No Retinopathy 03/10/2020 12:00 AM    Last diabetic Foot exam: No results found for: "HMDIABFOOTEX"   Lab Results  Component Value Date   CHOL 120 01/13/2021   HDL 55 01/13/2021   LDLCALC 49 01/13/2021   TRIG 79 01/13/2021   CHOLHDL 2.2 01/13/2021       Latest Ref Rng & Units 05/26/2022   10:19 AM 01/26/2022   10:11 AM 09/27/2021    2:19 PM  Hepatic Function  Total Protein 6.0 - 8.5 g/dL 7.2  6.6  6.8   Albumin 3.8 - 4.8 g/dL 4.3  4.2  4.2   AST 0 - 40 IU/L _0 ALT 0 - 32 IU/L _1 Alk Phosphatase 44 - 121 IU/L 91  104  89   Total Bilirubin 0.0 - 1.2 mg/dL 0.6  0.3  0.2     Lab Results  Component Value Date/Time   TSH 2.520 06/27/2022 10:56 AM   TSH 3.440 05/26/2022 10:19 AM   FREET4 1.32 06/27/2022 10:56 AM   FREET4 1.38 05/26/2022 10:19 AM       Latest Ref Rng & Units 05/26/2022   10:19 AM 03/14/2022    2:39 PM 09/27/2021    2:19 PM  CBC  WBC 3.4 - 10.8 x10E3/uL 4.5  3.8  3.6   Hemoglobin 11.1 - 15.9 g/dL 12.6  12.0  12.1   Hematocrit 34.0 - 46.6 % 37.6  36.8  35.8   Platelets 150 - 450 x10E3/uL 104  96  100     Lab Results  Component Value Date/Time   VD25OH 50.6 01/26/2022 10:11 AM   VD25OH 99.0 01/13/2021 08:15 AM    Clinical ASCVD: No  The ASCVD Risk score (Arnett DK, et al., 2019) failed to calculate for the following reasons:   The valid total cholesterol range is 130 to 320 mg/dL       05/24/2022    1:07 PM 12/16/2021    3:38 PM 04/12/2021   10:22 AM  Depression screen PHQ 2/9  Decreased Interest 0 0 0  Down, Depressed, Hopeless 0 0 0  PHQ - 2 Score 0 0 0  Altered sleeping   3  Tired, decreased  energy   3  Change in appetite   3  Feeling bad or failure about yourself    0  Trouble concentrating   3  Moving slowly or fidgety/restless   0  Suicidal thoughts   0  PHQ-9 Score   12  Difficult doing work/chores   Not difficult at all    -Last DEXA Scan: 02/05/20   T-Score femoral neck: -1.8  T-Score total hip: -1.3  T-Score lumbar spine: -1.4  T-Score forearm radius: -0.8  10-year probability of major osteoporotic fracture: 8.1%  10-year probability of hip fracture: 1.6%  Social History   Tobacco Use  Smoking Status Never  Smokeless Tobacco Former   Types: Snuff   Quit date: 10/27/1969   BP Readings from Last 3 Encounters:  07/05/22 114/70  06/27/22 (!) 95/57  05/26/22 99/66   Pulse Readings from Last 3 Encounters:  07/05/22 96  06/27/22 78  05/26/22 84   Wt Readings from Last 3 Encounters:  07/05/22 116 lb (52.6 kg)  06/27/22 114 lb (51.7 kg)  05/26/22 112 lb 9.6 oz (51.1 kg)   BMI Readings from Last 3 Encounters:  07/05/22 21.92 kg/m  06/27/22 21.54 kg/m  05/26/22 21.28 kg/m    Assessment/Interventions: Review of patient past medical history, allergies, medications, health status, including review of consultants reports, laboratory and other test data, was performed as part of comprehensive evaluation and provision of chronic care management services.   SDOH:  (Social Determinants of Health) assessments and interventions performed: Yes SDOH Interventions    Flowsheet Row Telephone from 04/27/2022 in [place] Office Visit from 01/26/2022 in [place] from 12/16/2021 in [place] Management from 07/02/2021 in [place] Management from 12/04/2020 in [place] Visit from 09/14/2020 in [place] Interventions Other (Comment)  [Spoke with patient  about meals on wheels, but she is not interested at this time.  She is interested in having a list of food pantries emailed to [email or web address]. She has already received her [employer] Meals benefit post discharge.] -- Intervention Not Indicated -- -- --  Housing Interventions -- -- Intervention Not Indicated -- -- --  Transportation Interventions -- -- Intervention Not Indicated -- Intervention Not Indicated --  Depression Interventions/Treatment  -- Patient refuses Treatment -- -- -- Counseling  Financial Strain Interventions -- -- Intervention Not Indicated Intervention Not Indicated Intervention Not Indicated --  Physical Activity Interventions -- -- Intervention Not Indicated -- -- --  Stress Interventions -- -- Intervention Not Indicated -- -- --  Social Connections Interventions -- -- Intervention Not Indicated -- -- --       SDOH Screenings   Food Insecurity: No Food Insecurity (05/24/2022)  Housing: Low Risk  (05/24/2022)  Transportation Needs: No Transportation Needs (05/24/2022)  Alcohol Screen: Low Risk  (05/24/2022)  Depression (PHQ2-9): Low Risk  (05/24/2022)  Financial Resource Strain: Low Risk  (05/24/2022)  Physical Activity: Insufficiently Active (12/16/2021)  Social Connections: Moderately Integrated (05/24/2022)  Stress: No Stress Concern Present (05/24/2022)  Tobacco Use: Medium Risk (07/05/2022)    CCM Care Plan  Allergies  Allergen Reactions   Other Other (See Comments)    NO BLOOD PRODUCTS- JEHOVAHS WITNESS   Sulfa Antibiotics Other (See Comments)    Loss of taste    Medications Reviewed Today     Reviewed by Raelene Bott, [place] Rocker (Certified Medical Assistant) on 07/05/22 at [place] List Status: <None>   Medication Order Taking? Sig Documenting Provider Last Dose Status Informant  acetaminophen (TYLENOL) 500 MG tablet [id number] Yes Take 1,000 mg by mouth 2 (two) times daily as needed for mild pain or moderate pain. [provider] Taking  Active   Alcohol Swabs (B-D SINGLE USE SWABS REGULAR) PADS [id number] Yes USE TO CHECK BLOOD SUGAR DAILY Birdie Sons, MD Taking Active   aspirin EC 81 MG tablet [id number] Yes Take 81 mg by mouth daily. [provider] Taking Active Self  atorvastatin (LIPITOR) 20 MG tablet [id number] Yes TAKE 1 TABLET EVERY DAY Birdie Sons, MD Taking Active   Blood Glucose Calibration (TRUE METRIX LEVEL 1) Low SOLN [id number] Yes Use as directed with glucose meter Birdie Sons, MD Taking Active   carvedilol (COREG) 3.125 MG tablet [id number] Yes TAKE 1 TABLET TWICE DAILY WITH MEALS Birdie Sons, MD Taking Active   cyanocobalamin 100 MCG tablet [id number] Yes Take 100 mcg by mouth daily. [provider] Taking Active   dapagliflozin propanediol (FARXIGA) 5 MG TABS tablet [id number] Yes TAKE 1 TABLET EVERY DAY BEFORE BREAKFAST Fisher, Kirstie Peri, MD Taking Active   ferrous sulfate 325 (65 FE) MG tablet [id number] Yes Take 325 mg by mouth 2 (two) times daily with a meal. [provider] Taking Active Self  furosemide (LASIX) 40 MG tablet [id number] Yes Take 1 tablet (40 mg) by mouth every other day Deboraha Sprang, MD Taking Active   glucose blood (TRUE METRIX BLOOD GLUCOSE TEST) test strip [id number] Yes CHECK BLOOD SUGAR EVERY DAY Birdie Sons, MD Taking Active   JANUMET XR [phone] MG TB24 [id number] Yes TAKE 1 TABLET EVERY DAY Birdie Sons, MD Taking Active   levothyroxine (SYNTHROID) 50 MCG tablet [id number] Yes Take 1 tablet (50 mcg total) by mouth daily. Mardene Speak, PA-C Taking Active   Multiple Vitamin (MULTIVITAMIN) tablet [id number] Yes Take 1 tablet by mouth daily. [provider] Taking Active Self  pantoprazole (PROTONIX) 40 MG tablet [id number] Yes TAKE 1 TABLET (40 MG TOTAL) BY MOUTH DAILY. Birdie Sons, MD Taking Active   sacubitril-valsartan [facility]) 24-26 MG [id number] Yes Take 0.5 tablets by mouth 2 (two) times daily. [provider]  Taking Active   senna (SENOKOT) 8.6 MG tablet [id number] Yes Take 1 tablet by mouth daily as needed. [provider] Taking Active   TRUEplus [place] [id number] Yes CHECK BLOOD SUGAR ONE TIME DAILY Caryn Section Kirstie Peri, MD Taking  Active             Patient Active Problem List   Diagnosis Date Noted   Hospital discharge follow-up 11/29/2021   Acute bilateral low back pain without sciatica 11/29/2021   Excessive daytime sleepiness 02/18/2021   Presence of automatic implantable cardioverter-defibrillator 02/18/2021   Thrombocytopenia ([place]) 06/11/2019   Pain due to onychomycosis of toenail of right foot 05/30/2019   Memory loss 07/12/2016   GERD (gastroesophageal reflux disease) 08/24/2015   Automatic implantable cardioverter-defibrillator in situ 07/29/2015   Hypotension 06/18/2015   Absolute anemia 03/11/2015   Abnormal liver enzymes 03/11/2015   Decreased potassium in the blood 03/11/2015   Hypercholesteremia 03/11/2015   Adult hypothyroidism 03/11/2015   Idiopathic insomnia 03/11/2015   OP (osteoporosis) 03/11/2015   Diabetes mellitus, type 2 ([place]) 03/11/2015   Vitamin D deficiency 03/11/2015   LBBB (left bundle branch block) 70/48/8891   Chronic systolic heart failure ([place]) 09/25/2014   Heart valve disease 04/10/2014    Immunization History  Administered Date(s) Administered   Fluad Quad(high Dose 65+) 07/22/2019, 09/14/2020   Influenza, High Dose Seasonal PF 07/07/2016, 10/09/2017, 08/01/2018, 08/21/2021   Influenza,inj,Quad PF,6+ Mos 08/14/2015   PFIZER Comirnaty(Gray Top)Covid-19 Tri-Sucrose Vaccine 01/02/2020, 01/07/2020, 08/24/2020, 08/30/2021   PFIZER(Purple Top)SARS-COV-2 Vaccination 12/13/2019, 01/07/2020   Pneumococcal Conjugate-13 09/18/2014   Pneumococcal Polysaccharide-23 09/11/2015    Conditions to be addressed/monitored:  Hypertension, Hyperlipidemia, Diabetes, Heart Failure, GERD, Hypothyroidism, and Osteoporosis  Care Plan : General  Pharmacy (Adult)  Updates made by Germaine Pomfret, [place] since 08/09/2022 12:00 AM     Problem: Hypertension, Hyperlipidemia, Diabetes, Heart Failure, GERD, Hypothyroidism, and Osteoporosis   Priority: High     Long-Range Goal: Patient-Specific Goal   Start Date: 07/02/2021  Expected End Date: 08/10/2023  This Visit's Progress: On track  Recent Progress: On track  Priority: High  Note:   Current Barriers:  No barriers noted  Pharmacist Clinical Goal(s):  Patient will maintain control of diabetes as evidenced by A1c less than 8%  maintain control of heart failure as evidenced by stable weight, lack of exacerbations through collaboration with PharmD and provider.   Interventions: 1:1 collaboration with Birdie Sons, MD regarding development and update of comprehensive plan of care as evidenced by provider attestation and co-signature Inter-disciplinary care team collaboration (see longitudinal plan of care) Comprehensive medication review performed; medication list updated in electronic medical record  Heart Failure (Goal: manage symptoms and prevent exacerbations) -Controlled -Last ejection fraction: 35% (Date: Mar 2017) -HF type: Systolic -NYHA Class: II (slight limitation of activity) -AHA HF Stage: C (Heart disease and symptoms present) -Current treatment: Carvedilol 3.125 mg twice daily: Appropriate, Effective, Safe, Accessible  Furosemide 40 mg 1/2 tablet daily: Appropriate, Effective, Safe, Accessible   Entresto 24-26 1/2 tablet twice daily: Appropriate, Effective, Safe, Accessible   -Medications previously tried: NA  -Current home blood pressure readings: 694H systolic's -Educated on Importance of weighing daily; if you gain more than 3 pounds in one day or 5 pounds in one week, contact cardiology office  -Recommended to continue current medication  Hyperlipidemia: (LDL goal < 100) -Controlled -Current treatment: Atorvastatin 20 mg daily  -Medications  previously tried: NA  -Recommended to continue current medication  Diabetes (A1c goal <8%) -Controlled -Current medications: Farxiga 5 mg daily: Appropriate, Effective, Safe, Accessible   Janumet [phone] mg daily: Appropriate, Effective, Safe, Accessible   -Medications previously tried: NA  -Current home glucose readings: 90s-120s, highest in recall was 125.  -Denies hypoglycemic/hyperglycemic symptoms -Current meal patterns:  Trying to minimize salt intake, carbohydrate intake  -Current exercise: Trying to get back into walking with sister -Recommended to continue current medication  Osteopenia (Goal Prevent bone fractures) -Controlled -Patient is not a candidate for pharmacologic treatment -Current treatment  None -Medications previously tried: NA  -Recommend [phone] units of vitamin D daily. -Recommended to continue current medication  Hypothyroidism (Goal: Maintain stable thyroid function) -Controlled -Current treatment  Synthroid 75 mcg daily  -Medications previously tried: NA  -Recommended to continue current medication  GERD (Goal: Prevent heartburn) -Controlled -Current treatment  Pantoprazole 40 mg daily -Medications previously tried: NA  -Recommended to continue current medication  Patient Goals/Self-Care Activities Patient will:  - check glucose daily before breakfast, document, and provide at future appointments check blood pressure weekly, document, and provide at future appointments weigh daily, and contact provider if weight gain of greater than 2 pounds in 24 hours   Follow Up Plan: Telephone follow up appointment with care management team member scheduled for:  02/05/2022 at 3:00 PM    Medication Assistance: Wilder Glade obtained through [place].    Compliance/Adherence/Medication fill history: Care Gaps: Ophthalmology   Star-Rating Drugs: Atorvastatin 20 mg: Last filled 05/05/21 for 90-DS Janumet [phone] mg: Last filled  05/05/21 for 90-DS  Entresto 24-26 mg: Last filled 05/17/21 for 90-DS  Patient's preferred pharmacy is:  [facility] [address], [place] [address] Phone: [phone] Fax: [phone]  [id number] [address], [place] [address] [place] [address] Phone: [phone] Fax: [phone]   Uses pill box? Yes Pt endorses 100% compliance  We discussed: Current pharmacy is preferred with insurance plan and patient is satisfied with pharmacy services Patient decided to: Continue current medication management strategy  Care Plan and Follow Up Patient Decision:  Patient agrees to Care Plan and Follow-up.  Plan: Telephone follow up appointment with care management team member scheduled for:  02/05/2022 at 3:00 PM  Junius Argyle, PharmD, [place], [place] [phone]

## 2022-08-10 ENCOUNTER — Ambulatory Visit
Admission: RE | Admit: 2022-08-10 | Discharge: 2022-08-10 | Disposition: A | Discharge status: Home / Self Care | Payer: Medicare HMO | Source: Ambulatory Visit | Attending: Family Medicine | Admitting: Family Medicine

## 2022-08-10 DIAGNOSIS — Z1231 Encounter for screening mammogram for malignant neoplasm of breast: Secondary | ICD-10-CM | POA: Insufficient documentation

## 2022-08-22 DIAGNOSIS — H40003 Preglaucoma, unspecified, bilateral: Secondary | ICD-10-CM | POA: Diagnosis not present

## 2022-08-22 DIAGNOSIS — Z0100 Encounter for examination of eyes and vision without abnormal findings: Secondary | ICD-10-CM | POA: Diagnosis not present

## 2022-08-22 LAB — HM DIABETES EYE EXAM

## 2022-08-24 LAB — HM DIABETES EYE EXAM

## 2022-08-30 ENCOUNTER — Encounter: Payer: Self-pay | Admitting: Family Medicine

## 2022-09-02 ENCOUNTER — Emergency Department: Payer: Medicare HMO

## 2022-09-02 ENCOUNTER — Emergency Department
Admission: EM | Admit: 2022-09-02 | Discharge: 2022-09-02 | Disposition: A | Discharge status: Home / Self Care | Payer: Medicare HMO | Attending: Emergency Medicine | Admitting: Emergency Medicine

## 2022-09-02 ENCOUNTER — Other Ambulatory Visit: Payer: Self-pay

## 2022-09-02 DIAGNOSIS — R0789 Other chest pain: Secondary | ICD-10-CM | POA: Diagnosis not present

## 2022-09-02 DIAGNOSIS — R079 Chest pain, unspecified: Secondary | ICD-10-CM | POA: Diagnosis not present

## 2022-09-02 DIAGNOSIS — Z95810 Presence of automatic (implantable) cardiac defibrillator: Secondary | ICD-10-CM | POA: Diagnosis not present

## 2022-09-02 LAB — BASIC METABOLIC PANEL
Anion gap: 6 (ref 5–15)
BUN: 13 mg/dL (ref 8–23)
CO2: 29 mmol/L (ref 22–32)
Calcium: 9.4 mg/dL (ref 8.9–10.3)
Chloride: 104 mmol/L (ref 98–111)
Creatinine, Ser: 0.83 mg/dL (ref 0.44–1.00)
GFR, Estimated: >60 mL/min (ref >60)
Glucose, Bld: 120 mg/dL — ABNORMAL HIGH (ref 70–99)
Potassium: 3.8 mmol/L (ref 3.5–5.1)
Sodium: 139 mmol/L (ref 135–145)

## 2022-09-02 LAB — CBC
HCT: 38 % (ref 36.0–46.0)
Hemoglobin: 12.1 g/dL (ref 12.0–15.0)
MCH: 28.9 pg (ref 26.0–34.0)
MCHC: 31.8 g/dL (ref 30.0–36.0)
MCV: 90.9 fL (ref 80.0–100.0)
Platelets: 93 10*3/uL — ABNORMAL LOW (ref 150–400)
RBC: 4.18 MIL/uL (ref 3.87–5.11)
RDW: 13.1 % (ref 11.5–15.5)
WBC: 3.4 10*3/uL — ABNORMAL LOW (ref 4.0–10.5)
nRBC: 0 % (ref 0.0–0.2)

## 2022-09-02 LAB — TROPONIN I (HIGH SENSITIVITY)
Troponin I (High Sensitivity): 6 ng/L (ref <18)
Troponin I (High Sensitivity): 5 ng/L (ref <18)

## 2022-09-02 MED ORDER — ACETAMINOPHEN 500 MG PO TABS
1000.0000 mg | ORAL_TABLET | Freq: Once | ORAL | Status: AC
  Administered 2022-09-02: 1000 mg via ORAL
  Filled 2022-09-02: qty 2

## 2022-09-02 MED ORDER — ACETAMINOPHEN 325 MG PO TABS: 650.0000 mg | ORAL_TABLET | Freq: Four times a day (QID) | ORAL | 0 refills | Status: DC | PRN

## 2022-09-02 MED ORDER — TRAMADOL HCL 50 MG PO TABS: 50.0000 mg | ORAL_TABLET | Freq: Once | ORAL | Status: DC

## 2022-09-02 NOTE — ED Notes (Signed)
Pacemaker interrogated, MD given report.

## 2022-09-02 NOTE — ED Triage Notes (Signed)
Pt. To ED for chest pain that started yesterday. Pt. States she was reaching up to pull down a hatch when pain started. Pt. States pain worse with movement. Pt. Denies any SOB or dizziness with pain. Pt has pacemaker/difib.

## 2022-09-02 NOTE — Discharge Instructions (Signed)
For your pain, take Tylenol 650 mg every 4-6 hours.  Alternatively, you can take up to 1000 mg 3 times a day of the extra strength Tylenol.  You can also apply over-the-counter Voltaren or diclofenac gel or lidocaine, which can be purchased at any pharmacy, for pain.  No heavy lifting and be cautious with use of your left arm.

## 2022-09-02 NOTE — ED Notes (Signed)
Patient transported to x-ray. ?

## 2022-09-02 NOTE — ED Provider Notes (Signed)
[facility] Provider Note    Event Date/Time   First MD Initiated Contact with Patient 09/02/22 [phone]     (approximate)   History   Chest Pain (Pt. To ED for chest pain that started yesterday. Pt. States she was reaching up to pull down a hatch when pain started. Pt. States pain worse with movement. Pt. Denies any SOB or dizziness with pain. Pt has pacemaker/difib.)   HPI  Bethany [name] is a 76 y.o. female here with chest pain.  The patient states that her pain started this morning.  She states that yesterday, she did reach up with her left arm to close a car trunk but denies feeling any significant pain at that time.  Since then, however, starting last night but more so this morning, she has had an aching, constant, but also somewhat positional left upper chest pain just below where her defibrillator is.  This was replaced about 6 months ago.  States that pain is worse with palpation over it.  Denies any pain prior to this.  No direct trauma.  No fevers or chills.  No shortness of breath.  No diaphoresis.  No palpitations.     Physical Exam   Triage Vital Signs: ED Triage Vitals  Enc Vitals Group     BP --      Pulse Rate 09/02/22 0836 74     Resp 09/02/22 0836 18     Temp 09/02/22 0836 98 F (36.7 C)     Temp Source 09/02/22 0836 Oral     SpO2 09/02/22 0836 100 %     Weight 09/02/22 0832 112 lb (50.8 kg)     Height 09/02/22 0832 5\' 1"  (1.549 m)     Head Circumference --      Peak Flow --      Pain Score 09/02/22 0831 9     Pain Loc --      Pain Edu? --      Excl. in GC? --     Most recent vital signs: Vitals:   09/02/22 0948 09/02/22 1300  BP: 115/71 105/68  Pulse: 70 77  Resp: 12 16  Temp: 98 F (36.7 C) (!) 97.4 F (36.3 C)  SpO2: 100% 99%     General: Awake, no distress.  CV:  Good peripheral perfusion.  Regular rate and rhythm. Resp:  Normal effort.  Lungs clear to auscultation bilaterally. Abd:  No distention.  No  tenderness. Other:  Mild tenderness to palpation over the left upper chest overlying the pacemaker but there is no erythema, warmth, fluctuance, or skin changes.  No tenderness over the lead wires.   ED Results / Procedures / Treatments   Labs (all labs ordered are listed, but only abnormal results are displayed) Labs Reviewed  BASIC METABOLIC PANEL - Abnormal; Notable for the following components:      Result Value   Glucose, Bld 120 (*)    All other components within normal limits  CBC - Abnormal; Notable for the following components:   WBC 3.4 (*)    Platelets 93 (*)    All other components within normal limits  TROPONIN I (HIGH SENSITIVITY)  TROPONIN I (HIGH SENSITIVITY)     EKG Atrial sensed, ventricular paced rhythm.  Ventricular rate 72.  QRS 116, QTc 477.  No acute ST elevations.   RADIOLOGY Chest x-ray: Clear   I also independently reviewed and agree with radiologist interpretations.   PROCEDURES:  Critical Care performed:  No  Procedures    MEDICATIONS ORDERED IN ED: Medications  acetaminophen (TYLENOL) tablet 1,000 mg (1,000 mg Oral Given 09/02/22 0959)     IMPRESSION / MDM / ASSESSMENT AND PLAN / ED COURSE  I reviewed the triage vital signs and the nursing notes.                               The patient is on the cardiac monitor to evaluate for evidence of arrhythmia and/or significant heart rate changes.  Ddx:  Differential includes the following, with pertinent life- or limb-threatening emergencies considered:  Chest wall pain, possibly intercostal versus related to PM, less likely ACS, PNA, PTX, atelectasis, GERD/gastritis, esophageal spasm  Patient's presentation is most consistent with acute presentation with potential threat to life or bodily function.  MDM:  76 year old female here with left upper chest wall pain.  Suspect musculoskeletal pain related to her AICD placement with possible injury yesterday when reaching up to close the  car trunk.  EKG here is nonischemic and troponins negative, do not suspect ACS.  CBC shows no significant leukocytosis.  BMP is at baseline.  Chest x-ray is clear.  AICD interrogated and shows no acute abnormality.  She feels better with Tylenol.  Will treat for likely musculoskeletal chest wall pain, discharged home.  She appears euvolemic on exam without signs of CHF.   MEDICATIONS GIVEN IN ED: Medications  acetaminophen (TYLENOL) tablet 1,000 mg (1,000 mg Oral Given 09/02/22 0959)     Consults:     EMR reviewed  Reviewed cardiology notes from Dr. Olin Pia office for nonischemic cardiomyopathy and ICD placement     FINAL CLINICAL IMPRESSION(S) / ED DIAGNOSES   Final diagnoses:  Chest wall pain     Rx / DC Orders   ED Discharge Orders          Ordered    acetaminophen (TYLENOL) 325 MG tablet  Every 6 hours PRN        09/02/22 1246             Note:  This document was prepared using Dragon voice recognition software and may include unintentional dictation errors.   Duffy Bruce, MD 09/02/22 1538

## 2022-09-06 DIAGNOSIS — I5020 Unspecified systolic (congestive) heart failure: Secondary | ICD-10-CM | POA: Diagnosis not present

## 2022-09-06 DIAGNOSIS — E039 Hypothyroidism, unspecified: Secondary | ICD-10-CM | POA: Diagnosis not present

## 2022-09-06 DIAGNOSIS — Z7984 Long term (current) use of oral hypoglycemic drugs: Secondary | ICD-10-CM

## 2022-09-06 DIAGNOSIS — E785 Hyperlipidemia, unspecified: Secondary | ICD-10-CM

## 2022-09-06 DIAGNOSIS — E1169 Type 2 diabetes mellitus with other specified complication: Secondary | ICD-10-CM | POA: Diagnosis not present

## 2022-09-07 DIAGNOSIS — I5022 Chronic systolic (congestive) heart failure: Secondary | ICD-10-CM | POA: Diagnosis not present

## 2022-09-07 DIAGNOSIS — I10 Essential (primary) hypertension: Secondary | ICD-10-CM | POA: Diagnosis not present

## 2022-09-07 DIAGNOSIS — E782 Mixed hyperlipidemia: Secondary | ICD-10-CM | POA: Diagnosis not present

## 2022-09-07 DIAGNOSIS — Z95810 Presence of automatic (implantable) cardiac defibrillator: Secondary | ICD-10-CM | POA: Diagnosis not present

## 2022-09-07 DIAGNOSIS — I447 Left bundle-branch block, unspecified: Secondary | ICD-10-CM | POA: Diagnosis not present

## 2022-09-08 ENCOUNTER — Telehealth: Payer: Self-pay

## 2022-09-08 NOTE — Progress Notes (Signed)
Chronic Care Management Pharmacy Assistant   Name: AZARIE CORIZ  MRN: [id number] DOB: [birth year]  Reason for Encounter: Diabetes Disease State Call/ER Follow-up   Recent office visits:  None ID  Recent consult visits:  None ID  Hospital visits:  Medication Reconciliation was completed by comparing discharge summary, patient's EMR and Pharmacy list, and upon discussion with patient.  Admitted to the hospital on 09/02/2022 due to Chest Pain. Discharge date was 09/02/2022. Discharged from [facility].    New?Medications Started at [facility] Discharge:?? -Started  None ID  Medication Changes at Hospital Discharge: -Changed acetaminophen 325 MG tablet Take 2 tablets (650 mg total) by mouth every 6 (six) hours as needed for moderate pain. What changed: medication strength how much to take when to take this reasons to take this  Medications Discontinued at Hospital Discharge: -Stopped None ID  Medications that remain the same after Hospital Discharge:??  -All other medications will remain the same.    Medications: Outpatient Encounter Medications as of 09/08/2022  Medication Sig   acetaminophen (TYLENOL) 325 MG tablet Take 2 tablets (650 mg total) by mouth every 6 (six) hours as needed for moderate pain.   Alcohol Swabs (B-D SINGLE USE SWABS REGULAR) PADS USE TO CHECK BLOOD SUGAR DAILY   aspirin EC 81 MG tablet Take 81 mg by mouth daily.   atorvastatin (LIPITOR) 20 MG tablet TAKE 1 TABLET EVERY DAY   Blood Glucose Calibration (TRUE METRIX LEVEL 1) Low SOLN Use as directed with glucose meter   carvedilol (COREG) 3.125 MG tablet TAKE 1 TABLET TWICE DAILY WITH MEALS   cyanocobalamin 100 MCG tablet Take 100 mcg by mouth daily.   dapagliflozin propanediol (FARXIGA) 5 MG TABS tablet TAKE 1 TABLET EVERY DAY BEFORE BREAKFAST   ferrous sulfate 325 (65 FE) MG tablet Take 325 mg by mouth 2 (two) times daily with a meal.   furosemide (LASIX)  40 MG tablet Take 1 tablet (40 mg) by mouth every other day   glucose blood (TRUE METRIX BLOOD GLUCOSE TEST) test strip CHECK BLOOD SUGAR EVERY DAY   JANUMET XR [phone] MG TB24 TAKE 1 TABLET EVERY DAY   levothyroxine (SYNTHROID) 50 MCG tablet Take 1 tablet (50 mcg total) by mouth daily.   Multiple Vitamin (MULTIVITAMIN) tablet Take 1 tablet by mouth daily.   pantoprazole (PROTONIX) 40 MG tablet TAKE 1 TABLET (40 MG TOTAL) BY MOUTH DAILY.   sacubitril-valsartan (ENTRESTO) 24-26 MG Take 0.5 tablets by mouth 2 (two) times daily.   senna (SENOKOT) 8.6 MG tablet Take 1 tablet by mouth daily as needed.   TRUEplus Lancets 33G MISC CHECK BLOOD SUGAR ONE TIME DAILY   No facility-administered encounter medications on file as of 09/08/2022.   Care Gaps: Tetanus/TDAP Zoster Vaccines Dexa Scan  Influenza Vaccine  Star Rating Drugs: Atorvastatin 20 mg last filled on 08/20/2022 for a 90-Day supply with [facility] 5 mg patient receives this medication from [employer]ME patient assistance program Janumet [phone] mg last filled on 07/27/2022 for a 90-Day supply with [facility]: Lab Results  Component Value Date/Time   HGBA1C 6.9 (H) 05/26/2022 10:19 AM   HGBA1C 6.9 (H) 01/26/2022 10:11 AM   HGBA1C 6.3 02/20/2014 07:57 PM   MICROALBUR negative 06/02/2020 10:36 AM   MICROALBUR negative 12/04/2018 11:50 AM    Kidney Function Lab Results  Component Value Date/Time   CREATININE 0.83 09/02/2022 08:29 AM   CREATININE 0.82 05/26/2022 10:19 AM  CREATININE 0.70 02/04/2015 02:26 PM   CREATININE 0.89 11/10/2014 11:47 AM   GFR 88.96 02/05/2015 10:34 AM   GFRNONAA >60 09/02/2022 08:29 AM   GFRNONAA >60 02/04/2015 02:26 PM   GFRAA 79 07/01/2020 11:26 AM   GFRAA >60 02/04/2015 02:26 PM   Current antihyperglycemic regimen:  Farxiga 5 mg 1 tablet daily Janumet XR [phone] mg 1 tablet daily  What recent interventions/DTPs have been made to improve glycemic control:   None ID Have there been any recent hospitalizations or ED visits since last visit with CPP? Yes  Patient denies hypoglycemic symptoms, including Pale, Sweaty, Shaky, Hungry, Nervous/irritable, and Vision changes  Patient denies hyperglycemic symptoms, including blurry vision, excessive thirst, fatigue, polyuria, and weakness  How often are you checking your blood sugar? once daily  What are your blood sugars ranging?  Fasting: 140-180  During the week, how often does your blood glucose drop below 70? Never Are you checking your feet daily/regularly?   Adherence Review: Is the patient currently on a STATIN medication? Yes Is the patient currently on ACE/ARB medication? No Does the patient have >5 day gap between last estimated fill dates? No  I spoke with the patient, and she reports today she is feeling better. Patient was recently in the ER due to chest pain, but per patient she was informed she pulled some muscles. Patient reports that she is not as in much pain as she was, but she still is sore.   Patient reports her blood sugars are doing pretty good. She had not yet checked them this morning but denies any lows below 70 or anything above 200. Patient stated she is still receiving her Wilder Glade with no issues. I informed patient that AZ[employer]ME will be doing automatic renewals unless they need additional information for 2024. I informed her that if there is anything she needs to provide I will contact her in a timely manner.   Patient has no issues or concerns today.  Patient has a follow-up telephone appointment with Junius Argyle, CPP on 02/06/2023 @ 1500.  Lynann Bologna, CPA/CMA Clinical Pharmacist Assistant Phone: [phone]

## 2022-09-21 ENCOUNTER — Other Ambulatory Visit: Payer: Self-pay | Admitting: Family Medicine

## 2022-09-26 ENCOUNTER — Encounter: Payer: Self-pay | Admitting: Podiatry

## 2022-09-26 ENCOUNTER — Ambulatory Visit: Payer: Medicare HMO | Admitting: Podiatry

## 2022-09-26 DIAGNOSIS — E119 Type 2 diabetes mellitus without complications: Secondary | ICD-10-CM

## 2022-09-26 DIAGNOSIS — B351 Tinea unguium: Secondary | ICD-10-CM

## 2022-09-26 DIAGNOSIS — M79674 Pain in right toe(s): Secondary | ICD-10-CM

## 2022-09-26 NOTE — Progress Notes (Signed)
This patient returns to my office for at risk foot care.  This patient requires this care by a professional since this patient will be at risk due to having type 2 diabetes and thrombocytopenia..  This patient is unable to cut nails herself since the patient cannot reach her nails.These nails are painful walking and wearing shoes.  This patient presents for at risk foot care today.  General Appearance  Alert, conversant and in no acute stress.  Vascular  Dorsalis pedis  are palpable  Bilaterally. Posterior tibial pulses are absent  B/L. Capillary return is within normal limits  bilaterally. Cold feet  Bilaterally.  Absent digital hair.  Neurologic  Senn-Weinstein monofilament wire test within normal limits  bilaterally. Muscle power within normal limits bilaterally.  Nails Thick disfigured discolored nails with subungual debris with incurvated nails both borders right hallux.. No evidence of bacterial infection or drainage bilaterally.  Orthopedic  No limitations of motion  feet .  No crepitus or effusions noted.  No bony pathology or digital deformities noted.  Skin  Thin skin  skin with no porokeratosis noted bilaterally.  No signs of infections or ulcers noted.     Onychomycosis  Pain in right toes  Pain in left toes  Consent was obtained for treatment procedures.   Mechanical debridement of nail both hallux. performed with a nail nipper.  Filed with dremel without incident.    Return office visit   3 months                   Told patient to return for periodic foot care and evaluation due to potential at risk complications.   Helane Gunther DPM

## 2022-09-27 ENCOUNTER — Ambulatory Visit (INDEPENDENT_AMBULATORY_CARE_PROVIDER_SITE_OTHER): Payer: Medicare HMO

## 2022-09-27 DIAGNOSIS — I428 Other cardiomyopathies: Secondary | ICD-10-CM | POA: Diagnosis not present

## 2022-09-27 LAB — CUP PACEART REMOTE DEVICE CHECK
Battery Remaining Longevity: 82 mo
Battery Voltage: 3.02 V
Brady Statistic AP VP Percent: 0.03 %
Brady Statistic AP VS Percent: 0.01 %
Brady Statistic AS VP Percent: 98.74 %
Brady Statistic AS VS Percent: 1.22 %
Brady Statistic RA Percent Paced: 0.04 %
Brady Statistic RV Percent Paced: 2.89 %
Date Time Interrogation Session: 20231121063627
HighPow Impedance: 59 Ohm
Implantable Lead Connection Status: 753985
Implantable Lead Connection Status: 753985
Implantable Lead Connection Status: 753985
Implantable Lead Implant Date: 20160409
Implantable Lead Implant Date: 20160409
Implantable Lead Implant Date: 20160409
Implantable Lead Location: 753858
Implantable Lead Location: 753859
Implantable Lead Location: 753860
Implantable Lead Model: 4598
Implantable Lead Model: 5076
Implantable Pulse Generator Implant Date: 20230523
Lead Channel Impedance Value: 1007 Ohm
Lead Channel Impedance Value: 1064 Ohm
Lead Channel Impedance Value: 1197 Ohm
Lead Channel Impedance Value: 1254 Ohm
Lead Channel Impedance Value: 1292 Ohm
Lead Channel Impedance Value: 278.237
Lead Channel Impedance Value: 289.597
Lead Channel Impedance Value: 309.309
Lead Channel Impedance Value: 341.479
Lead Channel Impedance Value: 358.75 Ohm
Lead Channel Impedance Value: 361 Ohm
Lead Channel Impedance Value: 361 Ohm
Lead Channel Impedance Value: 475 Ohm
Lead Channel Impedance Value: 513 Ohm
Lead Channel Impedance Value: 608 Ohm
Lead Channel Impedance Value: 665 Ohm
Lead Channel Impedance Value: 779 Ohm
Lead Channel Impedance Value: 988 Ohm
Lead Channel Pacing Threshold Amplitude: 0.375 V
Lead Channel Pacing Threshold Amplitude: 0.625 V
Lead Channel Pacing Threshold Amplitude: 1.75 V
Lead Channel Pacing Threshold Pulse Width: 0.4 ms
Lead Channel Pacing Threshold Pulse Width: 0.4 ms
Lead Channel Pacing Threshold Pulse Width: 1 ms
Lead Channel Sensing Intrinsic Amplitude: 0.625 mV
Lead Channel Sensing Intrinsic Amplitude: 0.625 mV
Lead Channel Sensing Intrinsic Amplitude: 10.25 mV
Lead Channel Sensing Intrinsic Amplitude: 10.25 mV
Lead Channel Setting Pacing Amplitude: 1.5 V
Lead Channel Setting Pacing Amplitude: 2 V
Lead Channel Setting Pacing Amplitude: 2.5 V
Lead Channel Setting Pacing Pulse Width: 0.4 ms
Lead Channel Setting Pacing Pulse Width: 1 ms
Lead Channel Setting Sensing Sensitivity: 0.3 mV
Zone Setting Status: 755011

## 2022-10-10 ENCOUNTER — Telehealth: Payer: Self-pay

## 2022-10-10 NOTE — Progress Notes (Signed)
Chronic Care Management Pharmacy Assistant   Name: Bethany [name]  MRN: [id number] DOB: [birth year]  Reason for Encounter: Diabetes Disease State Call   Recent office visits:  None ID  Recent consult visits:  09/26/2022 Helane Gunther, DPM (Podiatry) for Nail Problem-  No medication changes, No orders placed, Patient to follow-up in 3 months  Hospital visits:  None in previous 6 months  Medications: Outpatient Encounter Medications as of 10/10/2022  Medication Sig   acetaminophen (TYLENOL) 325 MG tablet Take 2 tablets (650 mg total) by mouth every 6 (six) hours as needed for moderate pain.   Alcohol Swabs (B-D SINGLE USE SWABS REGULAR) PADS USE TO CHECK BLOOD SUGAR DAILY   aspirin EC 81 MG tablet Take 81 mg by mouth daily.   atorvastatin (LIPITOR) 20 MG tablet TAKE 1 TABLET EVERY DAY   Blood Glucose Calibration (TRUE METRIX LEVEL 1) Low SOLN Use as directed with glucose meter   carvedilol (COREG) 3.125 MG tablet TAKE 1 TABLET TWICE DAILY WITH MEALS   cyanocobalamin 100 MCG tablet Take 100 mcg by mouth daily.   dapagliflozin propanediol (FARXIGA) 5 MG TABS tablet TAKE 1 TABLET EVERY DAY BEFORE BREAKFAST   ferrous sulfate 325 (65 FE) MG tablet Take 325 mg by mouth 2 (two) times daily with a meal.   furosemide (LASIX) 40 MG tablet Take 1 tablet (40 mg) by mouth every other day   glucose blood (TRUE METRIX BLOOD GLUCOSE TEST) test strip CHECK BLOOD SUGAR EVERY DAY   JANUMET XR [phone] MG TB24 TAKE 1 TABLET EVERY DAY   levothyroxine (SYNTHROID) 50 MCG tablet Take 1 tablet (50 mcg total) by mouth daily.   Multiple Vitamin (MULTIVITAMIN) tablet Take 1 tablet by mouth daily.   pantoprazole (PROTONIX) 40 MG tablet TAKE 1 TABLET EVERY DAY   sacubitril-valsartan (ENTRESTO) 24-26 MG Take 0.5 tablets by mouth 2 (two) times daily.   senna (SENOKOT) 8.6 MG tablet Take 1 tablet by mouth daily as needed.   TRUEplus Lancets 33G MISC CHECK BLOOD SUGAR ONE TIME DAILY   No facility-administered  encounter medications on file as of 10/10/2022.   Care Gaps: Dtap/Tdap Zoster Vaccines Dexa Scan Influenza Vaccine  Star Rating Drugs: Atorvastatin 20 mg last filled on 08/20/2022 for a 90-Day supply with [facility] Farxiga 5 mg patient receives this medication through AZ[employer]ME Patient Assistance Program Janumet XR [phone] mg last filled on 10/09/2022 for a 90-Day supply with [facility]  Recent Relevant Labs: Lab Results  Component Value Date/Time   HGBA1C 6.9 (H) 05/26/2022 10:19 AM   HGBA1C 6.9 (H) 01/26/2022 10:11 AM   HGBA1C 6.3 02/20/2014 07:57 PM   MICROALBUR negative 06/02/2020 10:36 AM   MICROALBUR negative 12/04/2018 11:50 AM    Kidney Function Lab Results  Component Value Date/Time   CREATININE 0.83 09/02/2022 08:29 AM   CREATININE 0.82 05/26/2022 10:19 AM   CREATININE 0.70 02/04/2015 02:26 PM   CREATININE 0.89 11/10/2014 11:47 AM   GFR 88.96 02/05/2015 10:34 AM   GFRNONAA >60 09/02/2022 08:29 AM   GFRNONAA >60 02/04/2015 02:26 PM   GFRAA 79 07/01/2020 11:26 AM   GFRAA >60 02/04/2015 02:26 PM    Current antihyperglycemic regimen:  Farxiga 5 mg daily Janumet [phone] mg daily  What recent interventions/DTPs have been made to improve glycemic control:  None ID Have there been any recent hospitalizations or ED visits since last visit with CPP? No Patient denies hypoglycemic symptoms, including Pale, Sweaty, Shaky, Hungry, Nervous/irritable, and Vision changes Patient denies  hyperglycemic symptoms, including blurry vision, excessive thirst, fatigue, polyuria, and weakness How often are you checking your blood sugar? once daily What are your blood sugars ranging?  Fasting: 90-120 patient stated her blood sugars are always pretty normal  During the week, how often does your blood glucose drop below 70? Never Are you checking your feet daily/regularly? Yes patient recently saw podiatry  Adherence Review: Is the patient currently on a STATIN medication?  Yes Is the patient currently on ACE/ARB medication? Yes Does the patient have >5 day gap between last estimated fill dates? No   Patient stated she is doing well. Patient denies any ill symptoms. Patient is currently receiving her Wilder Glade via AZ&ME patient assistance program with no issues. Patient has no concerns or issues at this time.  Lynann Bologna, CPA/CMA Clinical Pharmacist Assistant Phone: [phone]

## 2022-10-21 NOTE — Progress Notes (Signed)
Remote ICD transmission.   

## 2022-11-25 ENCOUNTER — Ambulatory Visit (INDEPENDENT_AMBULATORY_CARE_PROVIDER_SITE_OTHER): Payer: Medicare HMO | Admitting: Physician Assistant

## 2022-11-25 VITALS — BP 94/63 | HR 88 | Ht 61.0 in | Wt 114.0 lb

## 2022-11-25 DIAGNOSIS — I959 Hypotension, unspecified: Secondary | ICD-10-CM

## 2022-11-25 DIAGNOSIS — M25511 Pain in right shoulder: Secondary | ICD-10-CM

## 2022-11-25 DIAGNOSIS — R531 Weakness: Secondary | ICD-10-CM | POA: Diagnosis not present

## 2022-11-25 LAB — POCT URINALYSIS DIPSTICK
Bilirubin, UA: NEGATIVE
Blood, UA: NEGATIVE
Glucose, UA: NEGATIVE
Ketones, UA: NEGATIVE
Leukocytes, UA: NEGATIVE
Nitrite, UA: NEGATIVE
Protein, UA: NEGATIVE
Spec Grav, UA: 1.01 (ref 1.010–1.025)
Urobilinogen, UA: 0.2 E.U./dL
pH, UA: 6 (ref 5.0–8.0)

## 2022-11-25 NOTE — Progress Notes (Signed)
Established patient visit   Patient: Bethany [name]   DOB: 11-24-1945   77 y.o. Female  MRN: [id number] Visit Date: 11/25/2022  Today's healthcare provider: Alfredia Ferguson, PA-C   Cc.weakness  Subjective    HPI  Patient is a 77 year old female with past medical history of type 2 diabetes, heart failure with a defibrillator in place, and thrombocytopenia.  She presents today with 2 weeks of weakness in both of her legs.  She also reports worsening pain in her right shoulder which is led to weakness in her right arm. Reports limited range of motion of her right arm and right shoulder limited due to pain.  Denies numbness or tingling in either arm or lower extremities. Denies bilateral peripheral edema.  Reports worsening endurance and shortness of breath but denies wheezing.  Reports chronic urinary urgency and leaking and that this is unchanged in the last 2 weeks.  Denies fever, body aches, upper respiratory symptoms, new cough, lower extremity pain.  Denies recent falls or sick contacts.  Medications: Outpatient Medications Prior to Visit  Medication Sig   acetaminophen (TYLENOL) 325 MG tablet Take 2 tablets (650 mg total) by mouth every 6 (six) hours as needed for moderate pain.   Alcohol Swabs (B-D SINGLE USE SWABS REGULAR) PADS USE TO CHECK BLOOD SUGAR DAILY   aspirin EC 81 MG tablet Take 81 mg by mouth daily.   atorvastatin (LIPITOR) 20 MG tablet TAKE 1 TABLET EVERY DAY   Blood Glucose Calibration (TRUE METRIX LEVEL 1) Low SOLN Use as directed with glucose meter   carvedilol (COREG) 3.125 MG tablet TAKE 1 TABLET TWICE DAILY WITH MEALS   cyanocobalamin 100 MCG tablet Take 100 mcg by mouth daily.   dapagliflozin propanediol (FARXIGA) 5 MG TABS tablet TAKE 1 TABLET EVERY DAY BEFORE BREAKFAST   ferrous sulfate 325 (65 FE) MG tablet Take 325 mg by mouth 2 (two) times daily with a meal.   furosemide (LASIX) 40 MG tablet Take 1 tablet (40 mg) by mouth every other day    glucose blood (TRUE METRIX BLOOD GLUCOSE TEST) test strip CHECK BLOOD SUGAR EVERY DAY   JANUMET XR [phone] MG TB24 TAKE 1 TABLET EVERY DAY   levothyroxine (SYNTHROID) 50 MCG tablet Take 1 tablet (50 mcg total) by mouth daily.   Multiple Vitamin (MULTIVITAMIN) tablet Take 1 tablet by mouth daily.   pantoprazole (PROTONIX) 40 MG tablet TAKE 1 TABLET EVERY DAY   sacubitril-valsartan (ENTRESTO) 24-26 MG Take 0.5 tablets by mouth 2 (two) times daily.   senna (SENOKOT) 8.6 MG tablet Take 1 tablet by mouth daily as needed.   TRUEplus Lancets 33G MISC CHECK BLOOD SUGAR ONE TIME DAILY   No facility-administered medications prior to visit.    Review of Systems  Constitutional:  Positive for fatigue. Negative for fever.  Respiratory:  Negative for cough and shortness of breath.   Cardiovascular:  Negative for chest pain and leg swelling.  Gastrointestinal:  Negative for abdominal pain.  Neurological:  Positive for weakness. Negative for dizziness and headaches.     Objective    BP 94/63 (BP Location: Left Arm, Patient Position: Sitting, Cuff Size: Large)   Pulse 88   Ht 5\' 1"  (1.549 m)   Wt 114 lb (51.7 kg)   LMP  (LMP Unknown)   SpO2 97%   BMI 21.54 kg/m    Physical Exam Constitutional:      General: She is awake.     Appearance: She is well-developed.  She is not ill-appearing or toxic-appearing.     Comments: Observed gait is normal.  Patient able to get up and down from exam table with ease.  HENT:     Head: Normocephalic.  Eyes:     Conjunctiva/sclera: Conjunctivae normal.  Cardiovascular:     Rate and Rhythm: Normal rate and regular rhythm.     Heart sounds: Normal heart sounds.  Pulmonary:     Effort: Pulmonary effort is normal.     Breath sounds: Normal breath sounds. No wheezing, rhonchi or rales.  Musculoskeletal:     Right lower leg: No edema.     Left lower leg: No edema.  Skin:    General: Skin is warm.  Neurological:     Mental Status: She is alert and oriented  to person, place, and time.     Comments: Grip strength normal bilaterally.    Psychiatric:        Attention and Perception: Attention normal.        Mood and Affect: Mood normal.        Speech: Speech normal.        Behavior: Behavior is cooperative.     Results for orders placed or performed in visit on 11/25/22  Urine Culture   Specimen: Urine   Urine  Result Value Ref Range   Urine Culture, Routine Final report    Organism ID, Bacteria No growth   CBC w/Diff/Platelet  Result Value Ref Range   WBC 3.4 3.4 - 10.8 x10E3/uL   RBC 4.12 3.77 - 5.28 x10E6/uL   Hemoglobin 12.5 11.1 - 15.9 g/dL   Hematocrit 38.0 34.0 - 46.6 %   MCV 92 79 - 97 fL   MCH 30.3 26.6 - 33.0 pg   MCHC 32.9 31.5 - 35.7 g/dL   RDW 12.7 11.7 - 15.4 %   Platelets 94 (LL) 150 - 450 x10E3/uL   Neutrophils 47 Not Estab. %   Lymphs 43 Not Estab. %   Monocytes 9 Not Estab. %   Eos 1 Not Estab. %   Basos 0 Not Estab. %   Neutrophils Absolute 1.6 1.4 - 7.0 x10E3/uL   Lymphocytes Absolute 1.5 0.7 - 3.1 x10E3/uL   Monocytes Absolute 0.3 0.1 - 0.9 x10E3/uL   EOS (ABSOLUTE) 0.0 0.0 - 0.4 x10E3/uL   Basophils Absolute 0.0 0.0 - 0.2 x10E3/uL   Immature Granulocytes 0 Not Estab. %   Immature Grans (Abs) 0.0 0.0 - 0.1 x10E3/uL   Hematology Comments: Note:   Comprehensive Metabolic Panel (CMET)  Result Value Ref Range   Glucose 181 (H) 70 - 99 mg/dL   BUN 9 8 - 27 mg/dL   Creatinine, Ser 0.94 0.57 - 1.00 mg/dL   eGFR 63 >59 mL/min/1.73   BUN/Creatinine Ratio 10 (L) 12 - 28   Sodium 143 134 - 144 mmol/L   Potassium 3.8 3.5 - 5.2 mmol/L   Chloride 101 96 - 106 mmol/L   CO2 29 20 - 29 mmol/L   Calcium 10.0 8.7 - 10.3 mg/dL   Total Protein 7.4 6.0 - 8.5 g/dL   Albumin 4.5 3.8 - 4.8 g/dL   Globulin, Total 2.9 1.5 - 4.5 g/dL   Albumin/Globulin Ratio 1.6 1.2 - 2.2   Bilirubin Total 0.3 0.0 - 1.2 mg/dL   Alkaline Phosphatase 87 44 - 121 IU/L   AST 19 0 - 40 IU/L   ALT 10 0 - 32 IU/L  HgB A1c  Result Value Ref  Range   Hgb  A1c MFr Bld 7.2 (H) 4.8 - 5.6 %   Est. average glucose Bld gHb Est-mCnc 160 mg/dL  POCT Urinalysis Dipstick  Result Value Ref Range   Color, UA Light Yellow    Clarity, UA Clear    Glucose, UA Negative Negative   Bilirubin, UA Negative    Ketones, UA Negative    Spec Grav, UA 1.010 1.010 - 1.025   Blood, UA Negative    pH, UA 6.0 5.0 - 8.0   Protein, UA Negative Negative   Urobilinogen, UA 0.2 0.2 or 1.0 E.U./dL   Nitrite, UA Negative    Leukocytes, UA Negative Negative   Appearance     Odor      Assessment & Plan     Problem List Items Addressed This Visit       Cardiovascular and Mediastinum   Hypotension    Blood pressure soft today but on chart review this is stable for her.        Other   Weakness - Primary    Given normal physical exam will send out for urine culture, CBC, CMP. Patient appears euvolemic today with normal lung exam and no peripheral edema low suspicion for CHF exacerbation.  Pulse is regular heart sounds are normal. Given patient ED precautions over the weekend.  Will contact her with further recommendations based on results      Relevant Orders   POCT Urinalysis Dipstick (Completed)   CBC w/Diff/Platelet (Completed)   Comprehensive Metabolic Panel (CMET) (Completed)   HgB A1c (Completed)   Urine Culture (Completed)   Acute pain of right shoulder    I believe this to be a separate ongoing issue.  Advise referral to orthopedics.      Relevant Orders   Ambulatory referral to Orthopedics    Return if symptoms worsen or fail to improve.      I, Mikey Kirschner, PA-C have reviewed all documentation for this visit. The documentation on  11/25/22 for the exam, diagnosis, procedures, and orders are all accurate and complete.  Mikey Kirschner, PA-C [facility] [address] [place], [place], [address] Office: [phone] Fax: [place]

## 2022-11-26 LAB — COMPREHENSIVE METABOLIC PANEL
ALT: 10 IU/L (ref 0–32)
AST: 19 IU/L (ref 0–40)
Albumin/Globulin Ratio: 1.6 (ref 1.2–2.2)
Albumin: 4.5 g/dL (ref 3.8–4.8)
Alkaline Phosphatase: 87 IU/L (ref 44–121)
BUN/Creatinine Ratio: 10 — ABNORMAL LOW (ref 12–28)
BUN: 9 mg/dL (ref 8–27)
Bilirubin Total: 0.3 mg/dL (ref 0.0–1.2)
CO2: 29 mmol/L (ref 20–29)
Calcium: 10 mg/dL (ref 8.7–10.3)
Chloride: 101 mmol/L (ref 96–106)
Creatinine, Ser: 0.94 mg/dL (ref 0.57–1.00)
Globulin, Total: 2.9 g/dL (ref 1.5–4.5)
Glucose: 181 mg/dL — ABNORMAL HIGH (ref 70–99)
Potassium: 3.8 mmol/L (ref 3.5–5.2)
Sodium: 143 mmol/L (ref 134–144)
Total Protein: 7.4 g/dL (ref 6.0–8.5)
eGFR: 63 mL/min/{1.73_m2} (ref >59)

## 2022-11-26 LAB — CBC WITH DIFFERENTIAL/PLATELET
Basophils Absolute: 0 10*3/uL (ref 0.0–0.2)
Basos: 0 %
EOS (ABSOLUTE): 0 10*3/uL (ref 0.0–0.4)
Eos: 1 %
Hematocrit: 38 % (ref 34.0–46.6)
Hemoglobin: 12.5 g/dL (ref 11.1–15.9)
Immature Grans (Abs): 0 10*3/uL (ref 0.0–0.1)
Immature Granulocytes: 0 %
Lymphocytes Absolute: 1.5 10*3/uL (ref 0.7–3.1)
Lymphs: 43 %
MCH: 30.3 pg (ref 26.6–33.0)
MCHC: 32.9 g/dL (ref 31.5–35.7)
MCV: 92 fL (ref 79–97)
Monocytes Absolute: 0.3 10*3/uL (ref 0.1–0.9)
Monocytes: 9 %
Neutrophils Absolute: 1.6 10*3/uL (ref 1.4–7.0)
Neutrophils: 47 %
Platelets: 94 10*3/uL — CL (ref 150–450)
RBC: 4.12 x10E6/uL (ref 3.77–5.28)
RDW: 12.7 % (ref 11.7–15.4)
WBC: 3.4 10*3/uL (ref 3.4–10.8)

## 2022-11-26 LAB — HEMOGLOBIN A1C
Est. average glucose Bld gHb Est-mCnc: 160 mg/dL
Hgb A1c MFr Bld: 7.2 % — ABNORMAL HIGH (ref 4.8–5.6)

## 2022-11-27 LAB — URINE CULTURE: Organism ID, Bacteria: NO GROWTH

## 2022-11-28 ENCOUNTER — Other Ambulatory Visit: Payer: Self-pay | Admitting: Physician Assistant

## 2022-11-28 ENCOUNTER — Encounter: Payer: Self-pay | Admitting: Physician Assistant

## 2022-11-28 DIAGNOSIS — D696 Thrombocytopenia, unspecified: Secondary | ICD-10-CM

## 2022-11-28 DIAGNOSIS — M25511 Pain in right shoulder: Secondary | ICD-10-CM | POA: Insufficient documentation

## 2022-11-28 DIAGNOSIS — R531 Weakness: Secondary | ICD-10-CM

## 2022-11-28 NOTE — Assessment & Plan Note (Signed)
Blood pressure soft today but on chart review this is stable for her.

## 2022-11-28 NOTE — Assessment & Plan Note (Signed)
I believe this to be a separate ongoing issue.  Advise referral to orthopedics.

## 2022-11-28 NOTE — Assessment & Plan Note (Addendum)
Given normal physical exam will send out for urine culture, CBC, CMP. Patient appears euvolemic today with normal lung exam and no peripheral edema low suspicion for CHF exacerbation.  Pulse is regular heart sounds are normal. Given patient ED precautions over the weekend.  Will contact her with further recommendations based on results

## 2022-11-30 NOTE — Progress Notes (Unsigned)
Call made to new patient. Referral reason, location directions and appt details given to pt. Visitor and mask policy also addressed with pt. All questions answered.

## 2022-12-01 ENCOUNTER — Inpatient Hospital Stay: Payer: Medicare HMO | Attending: Oncology | Admitting: Oncology

## 2022-12-01 ENCOUNTER — Encounter: Payer: Self-pay | Admitting: Oncology

## 2022-12-01 ENCOUNTER — Inpatient Hospital Stay: Payer: Medicare HMO

## 2022-12-01 VITALS — BP 105/68 | HR 76 | Temp 96.2°F | Wt 116.1 lb

## 2022-12-01 DIAGNOSIS — Z79899 Other long term (current) drug therapy: Secondary | ICD-10-CM | POA: Insufficient documentation

## 2022-12-01 DIAGNOSIS — Z7982 Long term (current) use of aspirin: Secondary | ICD-10-CM | POA: Diagnosis not present

## 2022-12-01 DIAGNOSIS — Z87891 Personal history of nicotine dependence: Secondary | ICD-10-CM | POA: Diagnosis not present

## 2022-12-01 DIAGNOSIS — E119 Type 2 diabetes mellitus without complications: Secondary | ICD-10-CM | POA: Diagnosis not present

## 2022-12-01 DIAGNOSIS — E785 Hyperlipidemia, unspecified: Secondary | ICD-10-CM | POA: Diagnosis not present

## 2022-12-01 DIAGNOSIS — K219 Gastro-esophageal reflux disease without esophagitis: Secondary | ICD-10-CM | POA: Diagnosis not present

## 2022-12-01 DIAGNOSIS — D696 Thrombocytopenia, unspecified: Secondary | ICD-10-CM

## 2022-12-01 DIAGNOSIS — Z7984 Long term (current) use of oral hypoglycemic drugs: Secondary | ICD-10-CM | POA: Diagnosis not present

## 2022-12-01 DIAGNOSIS — E039 Hypothyroidism, unspecified: Secondary | ICD-10-CM | POA: Diagnosis not present

## 2022-12-01 LAB — HIV ANTIBODY (ROUTINE TESTING W REFLEX): HIV Screen 4th Generation wRfx: NONREACTIVE

## 2022-12-01 LAB — CBC WITH DIFFERENTIAL/PLATELET
Abs Immature Granulocytes: 0.01 10*3/uL (ref 0.00–0.07)
Basophils Absolute: 0 10*3/uL (ref 0.0–0.1)
Basophils Relative: 0 %
Eosinophils Absolute: 0 10*3/uL (ref 0.0–0.5)
Eosinophils Relative: 1 %
HCT: 38.4 % (ref 36.0–46.0)
Hemoglobin: 12 g/dL (ref 12.0–15.0)
Immature Granulocytes: 0 %
Lymphocytes Relative: 38 %
Lymphs Abs: 1.3 10*3/uL (ref 0.7–4.0)
MCH: 29.9 pg (ref 26.0–34.0)
MCHC: 31.3 g/dL (ref 30.0–36.0)
MCV: 95.5 fL (ref 80.0–100.0)
Monocytes Absolute: 0.3 10*3/uL (ref 0.1–1.0)
Monocytes Relative: 8 %
Neutro Abs: 1.7 10*3/uL (ref 1.7–7.7)
Neutrophils Relative %: 53 %
Platelets: 94 10*3/uL — ABNORMAL LOW (ref 150–400)
RBC: 4.02 MIL/uL (ref 3.87–5.11)
RDW: 12.7 % (ref 11.5–15.5)
WBC: 3.3 10*3/uL — ABNORMAL LOW (ref 4.0–10.5)
nRBC: 0 % (ref 0.0–0.2)

## 2022-12-01 LAB — TECHNOLOGIST SMEAR REVIEW
Plt Morphology: NORMAL
RBC MORPHOLOGY: NORMAL
WBC MORPHOLOGY: NORMAL

## 2022-12-01 LAB — HEPATITIS PANEL, ACUTE
HCV Ab: NONREACTIVE
Hep A IgM: NONREACTIVE
Hep B C IgM: NONREACTIVE
Hepatitis B Surface Ag: NONREACTIVE

## 2022-12-01 LAB — LACTATE DEHYDROGENASE: LDH: 197 U/L — ABNORMAL HIGH (ref 98–192)

## 2022-12-01 LAB — IMMATURE PLATELET FRACTION: Immature Platelet Fraction: 9.8 % — ABNORMAL HIGH (ref 1.2–8.6)

## 2022-12-01 LAB — VITAMIN B12: Vitamin B-12: 1268 pg/mL — ABNORMAL HIGH (ref 180–914)

## 2022-12-01 LAB — FOLATE: Folate: 29 ng/mL (ref >5.9)

## 2022-12-01 NOTE — Assessment & Plan Note (Addendum)
Chronic thrombocytopenia, physical examination did not reveal any significant splenomegaly. Check B12, folate, CBC, smear, immature platelet fraction, myeloma panel, light chain ratio, flow cytometry, LDH, hepatitis panel, HIV,

## 2022-12-01 NOTE — Progress Notes (Signed)
Hematology/Oncology Consult Note [phone] [phone]      REFERRING PROVIDER: Alfredia Ferguson, PA-C  CHIEF COMPLAINTS/REASON FOR VISIT:  Evaluation of thrombocytopenia  ASSESSMENT & PLAN:   Thrombocytopenia (HCC) Chronic thrombocytopenia, physical examination did not reveal any significant splenomegaly. Check B12, folate, CBC, smear, immature platelet fraction, myeloma panel, light chain ratio, flow cytometry, LDH, hepatitis panel, HIV,   Orders Placed This Encounter  Procedures   Vitamin B12    Standing Status:   Future    Number of Occurrences:   1    Standing Expiration Date:   12/02/2023   Folate    Standing Status:   Future    Number of Occurrences:   1    Standing Expiration Date:   12/02/2023   CBC with Differential/Platelet    Standing Status:   Future    Number of Occurrences:   1    Standing Expiration Date:   12/02/2023   Immature Platelet Fraction    Standing Status:   Future    Number of Occurrences:   1    Standing Expiration Date:   12/02/2023   Kappa/lambda light chains    Standing Status:   Future    Number of Occurrences:   1    Standing Expiration Date:   12/02/2023   Multiple Myeloma Panel (SPEP&IFE w/QIG)    Standing Status:   Future    Number of Occurrences:   1    Standing Expiration Date:   12/02/2023   Flow cytometry panel-leukemia/lymphoma work-up    Standing Status:   Future    Number of Occurrences:   1    Standing Expiration Date:   12/02/2023   Lactate dehydrogenase    Standing Status:   Future    Number of Occurrences:   1    Standing Expiration Date:   12/02/2023   Hepatitis panel, acute    Standing Status:   Future    Number of Occurrences:   1    Standing Expiration Date:   12/02/2023   HIV Antibody (routine testing w rflx)    Standing Status:   Future    Number of Occurrences:   1    Standing Expiration Date:   12/02/2023   ANA, IFA (with reflex)    Standing Status:   Future    Number of Occurrences:   1     Standing Expiration Date:   12/02/2023   Technologist smear review    Standing Status:   Future    Number of Occurrences:   1    Standing Expiration Date:   12/02/2023    Order Specific Question:   Clinical information:    Answer:   thrombocytopenia   Follow-up in few weeks to go over results. All questions were answered. The patient knows to call the clinic with any problems, questions or concerns.  Rickard Patience, MD, PhD [facility] Hematology Oncology 12/01/2022    HISTORY OF PRESENTING ILLNESS:  Bethany [name] is a 77 y.o. female who was seen in consultation at the request of Ok Edwards, Lillia Abed, PA-C for evaluation of thrombocytopenia Patient has a long standing thrombocytopenia history since at least 2014. Her counts fluctuates, ranging from 90s to 120s.  Denies weight loss, fever, chills, fatigue, night sweats.  Denies hematochezia, hematuria, hematemesis, epistaxis, black tarry stool.  Patient has no easy bruising.    Denies history hepatitis or HIV infection Denies history of chronic liver disease Denies routine alcohol consumption. Denies dietary restrictions.  MEDICAL HISTORY:  Past Medical History:  Diagnosis Date   AICD (automatic cardioverter/defibrillator) present    Anemia    Anxiety    CHF (congestive heart failure) (HCC)    Complication of anesthesia    Depression    Diabetes (HCC)    Dilated cardiomyopathy (HCC)    Diverticulosis    Dyspnea    Dysrhythmia    Edema    FEET/ANKLES OCCAS   GERD (gastroesophageal reflux disease)    Hyperlipidemia    Hypothyroidism    LV dysfunction    Orthopnea    USES 3 PILLOWS   PONV (postoperative nausea and vomiting)    Reflux    Thrombocytopenia (HCC)    Thyroid disease    VHD (valvular heart disease)     SURGICAL HISTORY: Past Surgical History:  Procedure Laterality Date   ABDOMINAL HYSTERECTOMY     BI-VENTRICULAR IMPLANTABLE CARDIOVERTER DEFIBRILLATOR [name] 02/11/2015   Procedure: BI-VENTRICULAR  IMPLANTABLE CARDIOVERTER DEFIBRILLATOR  (CRT-D);  Surgeon: Duke Salvia, MD;  [facility] CATH LAB;  Service: Cardiovascular;  Laterality: [name];   BONE MARROW BIOPSY  2007   spine   BREAST BIOPSY Right    neg   BREAST EXCISIONAL BIOPSY Right    CARDIAC CATHETERIZATION     CATARACT EXTRACTION W/PHACO Right 01/03/2017   Procedure: CATARACT EXTRACTION PHACO AND INTRAOCULAR LENS PLACEMENT (IOC);  Surgeon: Lockie Mola, MD;  Location: [facility] ORS;  Service: Ophthalmology;  Laterality: Right;  [place] 00:50 AP% 19.5 CDE 9.94 fluid pack lot # [id number]   CATARACT EXTRACTION W/PHACO Left 10/27/2016   Procedure: CATARACT EXTRACTION PHACO AND INTRAOCULAR LENS PLACEMENT (IOC);  Surgeon: Lockie Mola, MD;  Location: [facility] ORS;  Service: Ophthalmology;  Laterality: Left;  [place]  01:12 AP%  21.0 CDE  11.59 Fluid pack lot # [id number]   CHOLECYSTECTOMY     ICD GENERATOR CHANGEOUT [name] 03/29/2022   Procedure: ICD GENERATOR CHANGEOUT;  Surgeon: Duke Salvia, MD;  Location: [facility] INVASIVE CV LAB;  Service: Cardiovascular;  Laterality: [name];   INSERT / REPLACE / REMOVE PACEMAKER     AICD   LEAD REVISION [name] 02/11/2015   Procedure: LEAD REVISION;  Surgeon: Duke Salvia, MD;  Location: [facility] CATH LAB;  Service: Cardiovascular;  Laterality: [name];   PARTIAL HYSTERECTOMY  1977   TOOTH EXTRACTION  09/23/2015    SOCIAL HISTORY: Social History   Socioeconomic History   Marital status: Divorced    Spouse name: Not on file   Number of children: 4   Years of education: Not on file   Highest education level: GED or equivalent  Occupational History   Occupation: retired  Tobacco Use   Smoking status: Never   Smokeless tobacco: Former    Types: Snuff    Quit date: 10/27/1969  Vaping Use   Vaping Use: Never used  Substance and Sexual Activity   Alcohol use: No    Alcohol/week: 0.0 standard drinks of alcohol   Drug use: No   Sexual activity: Not on file  Other Topics Concern   Not on file  Social  History Narrative   Not on file   Social Determinants of Health   Financial Resource Strain: Low Risk  (05/24/2022)   Overall Financial Resource Strain (CARDIA)    Difficulty of Paying Living Expenses: Not hard at all  Food Insecurity: No Food Insecurity (05/24/2022)   Hunger Vital Sign    Worried About Running Out of Food in the Last Year: Never true    Ran Out  of Food in the Last Year: Never true  Transportation Needs: No Transportation Needs (05/24/2022)   PRAPARE - Hydrologist (Medical): No    Lack of Transportation (Non-Medical): No  Physical Activity: Insufficiently Active (12/16/2021)   Exercise Vital Sign    Days of Exercise per Week: 3 days    Minutes of Exercise per Session: 30 min  Stress: No Stress Concern Present (05/24/2022)   [place]    Feeling of Stress : Only a little  Social Connections: Moderately Integrated (05/24/2022)   Social Connection and Isolation Panel [NHANES]    Frequency of Communication with Friends and Family: More than three times a week    Frequency of Social Gatherings with Friends and Family: More than three times a week    Attends Religious Services: More than 4 times per year    Active Member of [employer] or Organizations: Yes    Attends Music therapist: More than 4 times per year    Marital Status: Divorced  Intimate Partner Violence: Not At Risk (12/16/2021)   Humiliation, Afraid, Rape, and Kick questionnaire    Fear of Current or Ex-Partner: No    Emotionally Abused: No    Physically Abused: No    Sexually Abused: No    FAMILY HISTORY: Family History  Problem Relation Age of Onset   Heart attack Mother    Prostate cancer Father    Ulcers Father    Heart disease Sister    Stroke Sister    Heart attack Sister    Diabetes Sister    Heart disease Sister    Diabetes Sister    Heart attack Sister    Diabetes Sister    Diabetes  Sister    Diabetes Sister    Diabetes Brother    Heart attack Brother    Stroke Brother    Breast cancer Neg Hx     ALLERGIES:  is allergic to other and sulfa antibiotics.  MEDICATIONS:  Current Outpatient Medications  Medication Sig Dispense Refill   acetaminophen (TYLENOL) 325 MG tablet Take 2 tablets (650 mg total) by mouth every 6 (six) hours as needed for moderate pain. 90 tablet 0   Alcohol Swabs (B-D SINGLE USE SWABS REGULAR) PADS USE TO CHECK BLOOD SUGAR DAILY 100 each 4   aspirin EC 81 MG tablet Take 81 mg by mouth daily.     atorvastatin (LIPITOR) 20 MG tablet TAKE 1 TABLET EVERY DAY 90 tablet 4   Blood Glucose Calibration (TRUE METRIX LEVEL 1) Low SOLN Use as directed with glucose meter 1 each 3   carvedilol (COREG) 3.125 MG tablet TAKE 1 TABLET TWICE DAILY WITH MEALS 180 tablet 4   cyanocobalamin 100 MCG tablet Take 100 mcg by mouth daily.     dapagliflozin propanediol (FARXIGA) 5 MG TABS tablet TAKE 1 TABLET EVERY DAY BEFORE BREAKFAST 90 tablet 3   ferrous sulfate 325 (65 FE) MG tablet Take 325 mg by mouth 2 (two) times daily with a meal.     furosemide (LASIX) 40 MG tablet Take 1 tablet (40 mg) by mouth every other day 30 tablet    glucose blood (TRUE METRIX BLOOD GLUCOSE TEST) test strip CHECK BLOOD SUGAR EVERY DAY 100 strip 3   JANUMET XR [phone] MG TB24 TAKE 1 TABLET EVERY DAY 90 tablet 4   levothyroxine (SYNTHROID) 50 MCG tablet Take 1 tablet (50 mcg total) by mouth daily. 90 tablet 3  Multiple Vitamin (MULTIVITAMIN) tablet Take 1 tablet by mouth daily.     pantoprazole (PROTONIX) 40 MG tablet TAKE 1 TABLET EVERY DAY 90 tablet 4   sacubitril-valsartan (ENTRESTO) 24-26 MG Take 0.5 tablets by mouth 2 (two) times daily.     senna (SENOKOT) 8.6 MG tablet Take 1 tablet by mouth daily as needed.     TRUEplus Lancets 33G MISC CHECK BLOOD SUGAR ONE TIME DAILY 100 each 4   No current facility-administered medications for this visit.    Review of Systems  Constitutional:   Negative for appetite change, chills, fatigue and fever.  HENT:   Negative for hearing loss and voice change.   Eyes:  Negative for eye problems.  Respiratory:  Negative for chest tightness and cough.   Cardiovascular:  Negative for chest pain.  Gastrointestinal:  Negative for abdominal distention, abdominal pain and blood in stool.  Endocrine: Negative for hot flashes.  Genitourinary:  Negative for difficulty urinating and frequency.   Musculoskeletal:  Negative for arthralgias.  Skin:  Negative for itching and rash.  Neurological:  Negative for extremity weakness.  Hematological:  Negative for adenopathy.  Psychiatric/Behavioral:  Negative for confusion.     PHYSICAL EXAMINATION: ECOG PERFORMANCE STATUS: 0 - Asymptomatic Vitals:   12/01/22 0912  BP: 105/68  Pulse: 76  Temp: (!) 96.2 F (35.7 C)  SpO2: 100%   Filed Weights   12/01/22 0912  Weight: 116 lb 1.6 oz (52.7 kg)    Physical Exam Constitutional:      General: She is not in acute distress. HENT:     Head: Normocephalic and atraumatic.  Eyes:     General: No scleral icterus. Cardiovascular:     Rate and Rhythm: Normal rate and regular rhythm.     Heart sounds: Normal heart sounds.  Pulmonary:     Effort: Pulmonary effort is normal. No respiratory distress.     Breath sounds: No wheezing.  Abdominal:     General: Bowel sounds are normal. There is no distension.     Palpations: Abdomen is soft.  Musculoskeletal:        General: No deformity. Normal range of motion.     Cervical back: Normal range of motion and neck supple.  Skin:    General: Skin is warm and dry.     Findings: No erythema or rash.  Neurological:     Mental Status: She is alert and oriented to person, place, and time. Mental status is at baseline.     Cranial Nerves: No cranial nerve deficit.     Coordination: Coordination normal.  Psychiatric:        Mood and Affect: Mood normal.      LABORATORY DATA:  I have reviewed the data as  listed    Latest Ref Rng & Units 12/01/2022    9:44 AM 11/25/2022    2:26 PM 09/02/2022    8:29 AM  CBC  WBC 4.0 - 10.5 K/uL 3.3  3.4  3.4   Hemoglobin 12.0 - 15.0 g/dL 96.0  45.4  09.8   Hematocrit 36.0 - 46.0 % 38.4  38.0  38.0   Platelets 150 - 400 K/uL 94  94  93       Latest Ref Rng & Units 11/25/2022    2:26 PM 09/02/2022    8:29 AM 05/26/2022   10:19 AM  CMP  Glucose 70 - 99 mg/dL 119  147  829   BUN 8 - 27 mg/dL 9  13  8   Creatinine 0.57 - 1.00 mg/dL 0.94  0.83  0.82   Sodium 134 - 144 mmol/L 143  139  139   Potassium 3.5 - 5.2 mmol/L 3.8  3.8  3.9   Chloride 96 - 106 mmol/L 101  104  99   CO2 20 - 29 mmol/L 29  29  25    Calcium 8.7 - 10.3 mg/dL 10.0  9.4  10.0   Total Protein 6.0 - 8.5 g/dL 7.4   7.2   Total Bilirubin 0.0 - 1.2 mg/dL 0.3   0.6   Alkaline Phos 44 - 121 IU/L 87   91   AST 0 - 40 IU/L 19   19   ALT 0 - 32 IU/L 10   7      RADIOGRAPHIC STUDIES: I have personally reviewed the radiological images as listed and agreed with the findings in the report.  CUP PACEART REMOTE DEVICE CHECK  Result Date: 09/27/2022 Scheduled remote reviewed. Normal device function.  Next remote 91 days- JJB

## 2022-12-01 NOTE — Progress Notes (Signed)
Patient is referred here by Sharen Counter for thrombocytopenia.

## 2022-12-02 DIAGNOSIS — E119 Type 2 diabetes mellitus without complications: Secondary | ICD-10-CM | POA: Diagnosis not present

## 2022-12-02 DIAGNOSIS — M7541 Impingement syndrome of right shoulder: Secondary | ICD-10-CM | POA: Diagnosis not present

## 2022-12-02 LAB — KAPPA/LAMBDA LIGHT CHAINS
Kappa free light chain: 20 mg/L — ABNORMAL HIGH (ref 3.3–19.4)
Kappa, lambda light chain ratio: 1.22 (ref 0.26–1.65)
Lambda free light chains: 16.4 mg/L (ref 5.7–26.3)

## 2022-12-02 LAB — FANA STAINING PATTERNS: Speckled Pattern: 24529

## 2022-12-02 LAB — ANTINUCLEAR ANTIBODIES, IFA: ANA Ab, IFA: POSITIVE — AB

## 2022-12-03 ENCOUNTER — Other Ambulatory Visit: Payer: Self-pay | Admitting: Family Medicine

## 2022-12-03 DIAGNOSIS — R5383 Other fatigue: Secondary | ICD-10-CM

## 2022-12-03 DIAGNOSIS — I5022 Chronic systolic (congestive) heart failure: Secondary | ICD-10-CM

## 2022-12-05 LAB — COMP PANEL: LEUKEMIA/LYMPHOMA

## 2022-12-05 NOTE — Telephone Encounter (Signed)
Unable to refill per protocol, Rx request is too soon. Last refill 11/29/21 for 90 and 4 refills. Will refuse.  Requested Prescriptions  Pending Prescriptions Disp Refills   carvedilol (COREG) 3.125 MG tablet [Pharmacy Med Name: CARVEDILOL 3.125 MG Tablet] 180 tablet 3    Sig: TAKE 1 TABLET TWICE DAILY WITH MEALS     Cardiovascular: Beta Blockers 3 Passed - 12/03/2022  2:54 AM      Passed - Cr in normal range and within 360 days    Creatinine  Date Value Ref Range Status  02/04/2015 0.70 mg/dL Final    Comment:    0.44-1.00 NOTE: New Reference Range  01/13/15    Creatinine, Ser  Date Value Ref Range Status  11/25/2022 0.94 0.57 - 1.00 mg/dL Final   Creatinine, POC  Date Value Ref Range Status  12/04/2018 n/a mg/dL Final         Passed - AST in normal range and within 360 days    AST  Date Value Ref Range Status  11/25/2022 19 0 - 40 IU/L Final   SGOT(AST)  Date Value Ref Range Status  04/02/2013 17 15 - 37 Unit/L Final         Passed - ALT in normal range and within 360 days    ALT  Date Value Ref Range Status  11/25/2022 10 0 - 32 IU/L Final   SGPT (ALT)  Date Value Ref Range Status  04/02/2013 16 12 - 78 U/L Final         Passed - Last BP in normal range    BP Readings from Last 1 Encounters:  12/01/22 105/68         Passed - Last Heart Rate in normal range    Pulse Readings from Last 1 Encounters:  12/01/22 76         Passed - Valid encounter within last 6 months    Recent Outpatient Visits           1 week ago Weakness   [place] Mikey Kirschner, PA-C   5 months ago Lower abdominal pain   [place], Donald E, MD   6 months ago Frequency of urination   [facility] [place], [place], PA-C   10 months ago Adult hypothyroidism   [place], Donald E, MD   1 year ago Hospital discharge follow-up   [facility] Gwyneth Sprout, [place]

## 2022-12-06 LAB — MULTIPLE MYELOMA PANEL, SERUM
Albumin SerPl Elph-Mcnc: 3.9 g/dL (ref 2.9–4.4)
Albumin/Glob SerPl: 1.4 (ref 0.7–1.7)
Alpha 1: 0.2 g/dL (ref 0.0–0.4)
Alpha2 Glob SerPl Elph-Mcnc: 0.6 g/dL (ref 0.4–1.0)
B-Globulin SerPl Elph-Mcnc: 0.9 g/dL (ref 0.7–1.3)
Gamma Glob SerPl Elph-Mcnc: 1.2 g/dL (ref 0.4–1.8)
Globulin, Total: 3 g/dL (ref 2.2–3.9)
IgA: 214 mg/dL (ref 64–422)
IgG (Immunoglobin G), Serum: 1233 mg/dL (ref 586–1602)
IgM (Immunoglobulin M), Srm: 18 mg/dL — ABNORMAL LOW (ref 26–217)
Total Protein ELP: 6.9 g/dL (ref 6.0–8.5)

## 2022-12-20 ENCOUNTER — Telehealth: Payer: Self-pay

## 2022-12-20 ENCOUNTER — Ambulatory Visit (INDEPENDENT_AMBULATORY_CARE_PROVIDER_SITE_OTHER): Payer: Medicare HMO

## 2022-12-20 VITALS — Ht 61.0 in | Wt 116.0 lb

## 2022-12-20 DIAGNOSIS — Z0000 Encounter for general adult medical examination without abnormal findings: Secondary | ICD-10-CM | POA: Diagnosis not present

## 2022-12-20 NOTE — Patient Instructions (Signed)
Bethany [name] , Thank you for taking time to come for your Medicare Wellness Visit. I appreciate your ongoing commitment to your health goals. Please review the following plan we discussed and let me know if I can assist you in the future.   These are the goals we discussed:  Goals      DIET - EAT MORE FRUITS AND VEGETABLES     Recommend increasing fruits and vegetables in daily diet to 2-3 servings each a day.      DIET - EAT MORE FRUITS AND VEGETABLES     Monitor and Manage My Blood Sugar-Diabetes Type 2     Timeframe:  Long-Range Goal Priority:  High Start Date: 07/02/2021                             Expected End Date: 07/02/2022                      Follow Up Date 08/06/2021    - check blood sugar at prescribed times - check blood sugar if I feel it is too high or too low - enter blood sugar readings and medication or insulin into daily log    Why is this important?   Checking your blood sugar at home helps to keep it from getting very high or very low.  Writing the results in a diary or log helps the doctor know how to care for you.  Your blood sugar log should have the time, date and the results.  Also, write down the amount of insulin or other medicine that you take.  Other information, like what you ate, exercise done and how you were feeling, will also be helpful.     Notes:      Track and Manage Fluids and Swelling-Heart Failure     Timeframe:  Long-Range Goal Priority:  High Start Date: 07/02/2021                             Expected End Date: 07/02/2022                      Follow Up Date 09/05/2021    - call office if I gain more than 2 pounds in one day or 5 pounds in one week - keep legs up while sitting - use salt in moderation - weigh myself daily    Why is this important?   It is important to check your weight daily and watch how much salt and liquids you have.  It will help you to manage your heart failure.    Notes:         This is a list of the  screening recommended for you and due dates:  Health Maintenance  Topic Date Due   Zoster (Shingles) Vaccine (1 of 2) Never done   DEXA scan (bone density measurement)  02/04/2022   COVID-19 Vaccine (8 - 2023-24 season) 11/14/2022   Complete foot exam   12/06/2022   Yearly kidney health urinalysis for diabetes  01/27/2023   Hemoglobin A1C  05/26/2023   Eye exam for diabetics  08/25/2023   Yearly kidney function blood test for diabetes  11/26/2023   Medicare Annual Wellness Visit  12/21/2023   DTaP/Tdap/Td vaccine (2 - Td or Tdap) 09/19/2032   Pneumonia Vaccine  Completed   Flu Shot  Completed  Hepatitis C Screening: USPSTF Recommendation to screen - Ages 77-79 yo.  Completed   HPV Vaccine  Aged Out   Colon Cancer Screening  Discontinued    Advanced directives: yes  Conditions/risks identified: none  Next appointment: Follow up in one year for your annual wellness visit 12/25/2023 @3pm$ /telephone   Preventive Care 77 Years and Older, Female Preventive care refers to lifestyle choices and visits with your health care provider that can promote health and wellness. What does preventive care include? A yearly physical exam. This is also called an annual well check. Dental exams once or twice a year. Routine eye exams. Ask your health care provider how often you should have your eyes checked. Personal lifestyle choices, including: Daily care of your teeth and gums. Regular physical activity. Eating a healthy diet. Avoiding tobacco and drug use. Limiting alcohol use. Practicing safe sex. Taking low-dose aspirin every day. Taking vitamin and mineral supplements as recommended by your health care provider. What happens during an annual well check? The services and screenings done by your health care provider during your annual well check will depend on your age, overall health, lifestyle risk factors, and family history of disease. Counseling  Your health care provider may ask you  questions about your: Alcohol use. Tobacco use. Drug use. Emotional well-being. Home and relationship well-being. Sexual activity. Eating habits. History of falls. Memory and ability to understand (cognition). Work and work Statistician. Reproductive health. Screening  You may have the following tests or measurements: Height, weight, and BMI. Blood pressure. Lipid and cholesterol levels. These may be checked every 5 years, or more frequently if you are over 61 years old. Skin check. Lung cancer screening. You may have this screening every year starting at age 77 if you have a 30-pack-year history of smoking and currently smoke or have quit within the past 15 years. Fecal occult blood test (FOBT) of the stool. You may have this test every year starting at age 77. Flexible sigmoidoscopy or colonoscopy. You may have a sigmoidoscopy every 5 years or a colonoscopy every 10 years starting at age 77. Hepatitis C blood test. Hepatitis B blood test. Sexually transmitted disease (STD) testing. Diabetes screening. This is done by checking your blood sugar (glucose) after you have not eaten for a while (fasting). You may have this done every 1-3 years. Bone density scan. This is done to screen for osteoporosis. You may have this done starting at age 77. Mammogram. This may be done every 77-2 years. Talk to your health care provider about how often you should have regular mammograms. Talk with your health care provider about your test results, treatment options, and if necessary, the need for more tests. Vaccines  Your health care provider may recommend certain vaccines, such as: Influenza vaccine. This is recommended every year. Tetanus, diphtheria, and acellular pertussis (Tdap, Td) vaccine. You may need a Td booster every 10 years. Zoster vaccine. You may need this after age 77. Pneumococcal 13-valent conjugate (PCV13) vaccine. One dose is recommended after age 77. Pneumococcal polysaccharide  (PPSV23) vaccine. One dose is recommended after age 77. Talk to your health care provider about which screenings and vaccines you need and how often you need them. This information is not intended to replace advice given to you by your health care provider. Make sure you discuss any questions you have with your health care provider. Document Released: 11/20/2015 Document Revised: 07/13/2016 Document Reviewed: 08/25/2015 Elsevier Interactive Patient Education  2017 [place] Prevention in  the Home Falls can cause injuries. They can happen to people of all ages. There are many things you can do to make your home safe and to help prevent falls. What can I do on the outside of my home? Regularly fix the edges of walkways and driveways and fix any cracks. Remove anything that might make you trip as you walk through a door, such as a raised step or threshold. Trim any bushes or trees on the path to your home. Use bright outdoor lighting. Clear any walking paths of anything that might make someone trip, such as rocks or tools. Regularly check to see if handrails are loose or broken. Make sure that both sides of any steps have handrails. Any raised decks and porches should have guardrails on the edges. Have any leaves, snow, or ice cleared regularly. Use sand or salt on walking paths during winter. Clean up any spills in your garage right away. This includes oil or grease spills. What can I do in the bathroom? Use night lights. Install grab bars by the toilet and in the tub and shower. Do not use towel bars as grab bars. Use non-skid mats or decals in the tub or shower. If you need to sit down in the shower, use a plastic, non-slip stool. Keep the floor dry. Clean up any water that spills on the floor as soon as it happens. Remove soap buildup in the tub or shower regularly. Attach bath mats securely with double-sided non-slip rug tape. Do not have throw rugs and other things on the  floor that can make you trip. What can I do in the bedroom? Use night lights. Make sure that you have a light by your bed that is easy to reach. Do not use any sheets or blankets that are too big for your bed. They should not hang down onto the floor. Have a firm chair that has side arms. You can use this for support while you get dressed. Do not have throw rugs and other things on the floor that can make you trip. What can I do in the kitchen? Clean up any spills right away. Avoid walking on wet floors. Keep items that you use a lot in easy-to-reach places. If you need to reach something above you, use a strong step stool that has a grab bar. Keep electrical cords out of the way. Do not use floor polish or wax that makes floors slippery. If you must use wax, use non-skid floor wax. Do not have throw rugs and other things on the floor that can make you trip. What can I do with my stairs? Do not leave any items on the stairs. Make sure that there are handrails on both sides of the stairs and use them. Fix handrails that are broken or loose. Make sure that handrails are as long as the stairways. Check any carpeting to make sure that it is firmly attached to the stairs. Fix any carpet that is loose or worn. Avoid having throw rugs at the top or bottom of the stairs. If you do have throw rugs, attach them to the floor with carpet tape. Make sure that you have a light switch at the top of the stairs and the bottom of the stairs. If you do not have them, ask someone to add them for you. What else can I do to help prevent falls? Wear shoes that: Do not have high heels. Have rubber bottoms. Are comfortable and fit you well. Are closed  at the toe. Do not wear sandals. If you use a stepladder: Make sure that it is fully opened. Do not climb a closed stepladder. Make sure that both sides of the stepladder are locked into place. Ask someone to hold it for you, if possible. Clearly mark and make  sure that you can see: Any grab bars or handrails. First and last steps. Where the edge of each step is. Use tools that help you move around (mobility aids) if they are needed. These include: Canes. Walkers. Scooters. Crutches. Turn on the lights when you go into a dark area. Replace any light bulbs as soon as they burn out. Set up your furniture so you have a clear path. Avoid moving your furniture around. If any of your floors are uneven, fix them. If there are any pets around you, be aware of where they are. Review your medicines with your doctor. Some medicines can make you feel dizzy. This can increase your chance of falling. Ask your doctor what other things that you can do to help prevent falls. This information is not intended to replace advice given to you by your health care provider. Make sure you discuss any questions you have with your health care provider. Document Released: 08/20/2009 Document Revised: 03/31/2016 Document Reviewed: 11/28/2014 Elsevier Interactive Patient Education  2017 [employer].

## 2022-12-20 NOTE — Progress Notes (Signed)
I connected with  Bethany [name] on 12/20/22 by a audio enabled telemedicine application and verified that I am speaking with the correct person using two identifiers.  Patient Location: Home  Provider Location: Home Office  I discussed the limitations of evaluation and management by telemedicine. The patient expressed understanding and agreed to proceed.  Subjective:   Bethany [name] is a 77 y.o. female who presents for Medicare Annual (Subsequent) preventive examination.  Review of Systems         Objective:    Today's Vitals   12/20/22 1511  Weight: 116 lb (52.6 kg)  Height: 5' 1"$  (1.549 m)   Body mass index is 21.92 kg/m.     12/20/2022    3:22 PM 12/01/2022    9:17 AM 09/02/2022    8:33 AM 03/29/2022    7:04 AM 12/16/2021    3:40 PM 11/26/2021   10:28 AM 04/11/2021   10:22 AM  Advanced Directives  Does Patient Have a Medical Advance Directive? Yes Yes No;Yes Yes No No No  Type of Corporate treasurer of [place];Living will     Copy of [place] in Chart?  No - copy requested       Would patient like information on creating a medical advance directive?     No - Patient declined No - Patient declined     Current Medications (verified) Outpatient Encounter Medications as of 12/20/2022  Medication Sig   acetaminophen (TYLENOL) 325 MG tablet Take 2 tablets (650 mg total) by mouth every 6 (six) hours as needed for moderate pain.   Alcohol Swabs (B-D SINGLE USE SWABS REGULAR) PADS USE TO CHECK BLOOD SUGAR DAILY   aspirin EC 81 MG tablet Take 81 mg by mouth daily.   atorvastatin (LIPITOR) 20 MG tablet TAKE 1 TABLET EVERY DAY   Blood Glucose Calibration (TRUE METRIX LEVEL 1) Low SOLN Use as directed with glucose meter   carvedilol (COREG) 3.125 MG tablet TAKE 1 TABLET TWICE DAILY WITH MEALS   cyanocobalamin 100 MCG tablet Take 100 mcg by mouth daily.   dapagliflozin propanediol (FARXIGA) 5 MG TABS tablet TAKE 1  TABLET EVERY DAY BEFORE BREAKFAST   ferrous sulfate 325 (65 FE) MG tablet Take 325 mg by mouth 2 (two) times daily with a meal.   furosemide (LASIX) 40 MG tablet Take 1 tablet (40 mg) by mouth every other day   glucose blood (TRUE METRIX BLOOD GLUCOSE TEST) test strip CHECK BLOOD SUGAR EVERY DAY   JANUMET XR [phone] MG TB24 TAKE 1 TABLET EVERY DAY   levothyroxine (SYNTHROID) 50 MCG tablet Take 1 tablet (50 mcg total) by mouth daily.   Multiple Vitamin (MULTIVITAMIN) tablet Take 1 tablet by mouth daily.   pantoprazole (PROTONIX) 40 MG tablet TAKE 1 TABLET EVERY DAY   sacubitril-valsartan (ENTRESTO) 24-26 MG Take 0.5 tablets by mouth 2 (two) times daily.   senna (SENOKOT) 8.6 MG tablet Take 1 tablet by mouth daily as needed.   TRUEplus Lancets 33G MISC CHECK BLOOD SUGAR ONE TIME DAILY   No facility-administered encounter medications on file as of 12/20/2022.    Allergies (verified) Other and Sulfa antibiotics   History: Past Medical History:  Diagnosis Date   AICD (automatic cardioverter/defibrillator) present    Anemia    Anxiety    CHF (congestive heart failure) (HCC)    Complication of anesthesia    Depression    Diabetes ([place])    Dilated  cardiomyopathy ([place])    Diverticulosis    Dyspnea    Dysrhythmia    Edema    FEET/ANKLES OCCAS   GERD (gastroesophageal reflux disease)    Hyperlipidemia    Hypothyroidism    LV dysfunction    Orthopnea    USES 3 PILLOWS   PONV (postoperative nausea and vomiting)    Reflux    Thrombocytopenia (HCC)    Thyroid disease    VHD (valvular heart disease)    Past Surgical History:  Procedure Laterality Date   ABDOMINAL HYSTERECTOMY     BI-VENTRICULAR IMPLANTABLE CARDIOVERTER DEFIBRILLATOR [name] 02/11/2015   Procedure: BI-VENTRICULAR IMPLANTABLE CARDIOVERTER DEFIBRILLATOR  (CRT-D);  Surgeon: Deboraha Sprang, MD;  Location: [facility] CATH LAB;  Service: Cardiovascular;  Laterality: [name];   BONE MARROW BIOPSY  2007   spine   BREAST BIOPSY Right     neg   BREAST EXCISIONAL BIOPSY Right    CARDIAC CATHETERIZATION     CATARACT EXTRACTION W/PHACO Right 01/03/2017   Procedure: CATARACT EXTRACTION PHACO AND INTRAOCULAR LENS PLACEMENT (IOC);  Surgeon: Leandrew Koyanagi, MD;  Location: [facility] ORS;  Service: Ophthalmology;  Laterality: Right;  [place] 00:50 AP% 19.5 CDE 9.94 fluid pack lot # [id number]   CATARACT EXTRACTION W/PHACO Left 10/27/2016   Procedure: CATARACT EXTRACTION PHACO AND INTRAOCULAR LENS PLACEMENT (IOC);  Surgeon: Leandrew Koyanagi, MD;  Location: [facility] ORS;  Service: Ophthalmology;  Laterality: Left;  [place]  01:12 AP%  21.0 CDE  11.59 Fluid pack lot # [id number]   CHOLECYSTECTOMY     ICD GENERATOR CHANGEOUT [name] 03/29/2022   Procedure: ICD GENERATOR CHANGEOUT;  Surgeon: Deboraha Sprang, MD;  Location: [place] CV LAB;  Service: Cardiovascular;  Laterality: [name];   INSERT / REPLACE / REMOVE PACEMAKER     AICD   LEAD REVISION [name] 02/11/2015   Procedure: LEAD REVISION;  Surgeon: Deboraha Sprang, MD;  Location: [facility] CATH LAB;  Service: Cardiovascular;  Laterality: [name];   PARTIAL HYSTERECTOMY  1977   TOOTH EXTRACTION  09/23/2015   Family History  Problem Relation Age of Onset   Heart attack Mother    Prostate cancer Father    Ulcers Father    Heart disease Sister    Stroke Sister    Heart attack Sister    Diabetes Sister    Heart disease Sister    Diabetes Sister    Heart attack Sister    Diabetes Sister    Diabetes Sister    Diabetes Sister    Diabetes Brother    Heart attack Brother    Stroke Brother    Breast cancer Neg Hx    Social History   Socioeconomic History   Marital status: Divorced    Spouse name: Not on file   Number of children: 4   Years of education: Not on file   Highest education level: GED or equivalent  Occupational History   Occupation: retired  Tobacco Use   Smoking status: Never   Smokeless tobacco: Former    Types: Snuff    Quit date: 10/27/1969  Vaping Use   Vaping Use: Never used   Substance and Sexual Activity   Alcohol use: No    Alcohol/week: 0.0 standard drinks of alcohol   Drug use: No   Sexual activity: Not on file  Other Topics Concern   Not on file  Social History Narrative   Not on file   Social Determinants of Health   Financial Resource Strain: Low Risk  (12/20/2022)   Overall  Financial Resource Strain (CARDIA)    Difficulty of Paying Living Expenses: Not hard at all  Food Insecurity: No Food Insecurity (05/24/2022)   Hunger Vital Sign    Worried About Running Out of Food in the Last Year: Never true    Ran Out of Food in the Last Year: Never true  Transportation Needs: No Transportation Needs (12/20/2022)   PRAPARE - Hydrologist (Medical): No    Lack of Transportation (Non-Medical): No  Physical Activity: Insufficiently Active (12/20/2022)   Exercise Vital Sign    Days of Exercise per Week: 3 days    Minutes of Exercise per Session: 30 min  Stress: No Stress Concern Present (12/20/2022)   [place]    Feeling of Stress : Only a little  Social Connections: Moderately Integrated (12/20/2022)   Social Connection and Isolation Panel [NHANES]    Frequency of Communication with Friends and Family: More than three times a week    Frequency of Social Gatherings with Friends and Family: More than three times a week    Attends Religious Services: More than 4 times per year    Active Member of [employer] or Organizations: Yes    Attends Music therapist: More than 4 times per year    Marital Status: Divorced    Tobacco Counseling Counseling given: Not Answered   Clinical Intake:  Pre-visit preparation completed: Yes  Pain : No/denies pain     BMI - recorded: 21.92 Nutritional Status: BMI of 19-24  Normal Nutritional Risks: Nausea/ vomitting/ diarrhea (nausea  the last couple days:no vomitting) Diabetes: Yes CBG done?: Yes (pt done at home  93) CBG resulted in Enter/ Edit results?: No Did pt. bring in CBG monitor from home?: No (televisit)  How often do you need to have someone help you when you read instructions, pamphlets, or other written materials from your doctor or pharmacy?: 1 - Never  Diabetic?yes  Interpreter Needed?: No  Information entered by :: B.Clemence Lengyel,LPN   Activities of Daily Living    12/20/2022    3:22 PM 11/25/2022    1:28 PM  In your present state of health, do you have any difficulty performing the following activities:  Hearing? 0 0  Vision? 0 0  Difficulty concentrating or making decisions? 0 0  Walking or climbing stairs? 0 0  Dressing or bathing? 0 0  Doing errands, shopping? 0 0  Preparing Food and eating ? N   Using the Toilet? N   In the past six months, have you accidently leaked urine? Y   Do you have problems with loss of bowel control? N   Managing your Medications? N   Managing your Finances? N   Housekeeping or managing your Housekeeping? N     Patient Care Team: Birdie Sons, MD as PCP - General (Family Medicine) Corey Skains, MD as Consulting Physician (Cardiology) Deboraha Sprang, MD as Consulting Physician (Cardiology) Leandrew Koyanagi, MD as Referring Physician (Ophthalmology) Eulogio Bear, MD as Consulting Physician (Ophthalmology) Gardiner Barefoot, DPM as Consulting Physician (Podiatry) Germaine Pomfret, [facility] (Pharmacist)  Indicate any recent Medical Services you may have received from other than [facility] providers in the past year (date may be approximate).     Assessment:   This is a routine wellness examination for Bethany [name].  Hearing/Vision screen Hearing Screening - Comments:: Can hear but not clear sometimes Vision Screening - Comments:: Adequate vision w/glasses. [place]  Dietary issues and exercise activities discussed:     Goals Addressed   None    Depression Screen    12/20/2022    3:18 PM 11/25/2022    1:27 PM 05/24/2022     1:07 PM 12/16/2021    3:38 PM 04/12/2021   10:22 AM 01/12/2021   11:54 AM 09/14/2020   11:15 AM  PHQ 2/9 Scores  PHQ - 2 Score 0 0 0 0 0 0 0  PHQ- 9 Score 0 0   12 12 8    $ Fall Risk    12/20/2022    3:15 PM 11/25/2022    1:28 PM 12/16/2021    3:40 PM 12/15/2021    9:22 PM 04/12/2021   10:22 AM  Fall Risk   Falls in the past year? 0 0 0 0 0  Number falls in past yr: 0 0 0 0 0  Injury with Fall? 0 0 0 0 0  Risk for fall due to : No Fall Risks  No Fall Risks  No Fall Risks  Follow up Education provided;Falls prevention discussed  Falls evaluation completed  Falls evaluation completed    FALL RISK PREVENTION PERTAINING TO THE HOME:  Any stairs in or around the home? No  If so, are there any without handrails? No  Home free of loose throw rugs in walkways, pet beds, electrical cords, etc? Yes  Adequate lighting in your home to reduce risk of falls? Yes   ASSISTIVE DEVICES UTILIZED TO PREVENT FALLS:  Life alert? No  Use of a cane, walker or w/c? No  Grab bars in the bathroom? No  Shower chair or bench in shower? No  Elevated toilet seat or a handicapped toilet? No   Cognitive Function:        04/09/2020   11:09 AM 04/04/2018    1:49 PM 03/23/2017    3:05 PM  6CIT Screen  What Year? 0 points 0 points 0 points  What month? 0 points 0 points 0 points  What time? 0 points 0 points 0 points  Count back from 20 0 points 0 points 0 points  Months in reverse 0 points 0 points 0 points  Repeat phrase 2 points 6 points 6 points  Total Score 2 points 6 points 6 points    Immunizations Immunization History  Administered Date(s) Administered   Fluad Quad(high Dose 65+) 07/22/2019, 09/14/2020   Influenza, High Dose Seasonal PF 07/07/2016, 10/09/2017, 08/01/2018, 08/21/2021   Influenza,inj,Quad PF,6+ Mos 08/14/2015   Influenza-Unspecified 09/19/2022   PFIZER Comirnaty(Gray Top)Covid-19 Tri-Sucrose Vaccine 01/02/2020, 01/07/2020, 08/24/2020, 08/30/2021   PFIZER(Purple Top)SARS-COV-2  Vaccination 12/13/2019, 01/07/2020   Pfizer Covid-19 Vaccine Bivalent Booster 54yr & up 09/19/2022   Pneumococcal Conjugate-13 09/18/2014   Pneumococcal Polysaccharide-23 09/11/2015   Tdap 09/19/2022    TDAP status: Up to date  Flu Vaccine status: Up to date  Pneumococcal vaccine status: Up to date  Covid-19 vaccine status: Completed vaccines  Qualifies for Shingles Vaccine? Yes  Zostavax completed no  Screening Tests Health Maintenance  Topic Date Due   Zoster Vaccines- Shingrix (1 of 2) Never done   DEXA SCAN  02/04/2022   COVID-19 Vaccine (8 - 2023-24 season) 11/14/2022   FOOT EXAM  12/06/2022   Diabetic kidney evaluation - Urine ACR  01/27/2023   HEMOGLOBIN A1C  05/26/2023   OPHTHALMOLOGY EXAM  08/25/2023   Diabetic kidney evaluation - eGFR measurement  11/26/2023   Medicare Annual Wellness (AWV)  12/21/2023   DTaP/Tdap/Td (2 - Td or  Tdap) 09/19/2032   Pneumonia Vaccine 5+ Years old  Completed   INFLUENZA VACCINE  Completed   Hepatitis C Screening  Completed   HPV VACCINES  Aged Out   COLONOSCOPY (Pts 45-78yr Insurance coverage will need to be confirmed)  Discontinued    Health Maintenance  Health Maintenance Due  Topic Date Due   Zoster Vaccines- Shingrix (1 of 2) Never done   DEXA SCAN  02/04/2022   COVID-19 Vaccine (8 - 2023-24 season) 11/14/2022   FOOT EXAM  12/06/2022    Colorectal cancer screening: No longer required.   Mammogram status: No longer required due to age.  Bone Density status: Completed yes. Results reflect: Bone density results: NORMAL. Repeat every 5 years.  Lung Cancer Screening: (Low Dose CT Chest recommended if Age 77-80years, 30 pack-year currently smoking OR have quit w/in 15years.) does not qualify.   Lung Cancer Screening Referral: no  Additional Screening:  Hepatitis C Screening: does not qualify; Completed no  Vision Screening: Recommended annual ophthalmology exams for early detection of glaucoma and other disorders  of the eye. Is the patient up to date with their annual eye exam?  Yes  Who is the provider or what is the name of the office in which the patient attends annual eye exams? [facility] pt is not established with a provider, would they like to be referred to a provider to establish care? No .   Dental Screening: Recommended annual dental exams for proper oral hygiene  Community Resource Referral / Chronic Care Management: CRR required this visit?  No   CCM required this visit?  No      Plan:     I have personally reviewed and noted the following in the patient's chart:   Medical and social history Use of alcohol, tobacco or illicit drugs  Current medications and supplements including opioid prescriptions. Patient is not currently taking opioid prescriptions. Functional ability and status Nutritional status Physical activity Advanced directives List of other physicians Hospitalizations, surgeries, and ER visits in previous 12 months Vitals Screenings to include cognitive, depression, and falls Referrals and appointments  In addition, I have reviewed and discussed with patient certain preventive protocols, quality metrics, and best practice recommendations. A written personalized care plan for preventive services as well as general preventive health recommendations were provided to patient.     [facility] LPN   [id number]  Nurse Notes: Pt states she is doing well. pt wants to know results of last bloodwork ordered was okay. [place] any [place].

## 2022-12-26 NOTE — Telephone Encounter (Signed)
Pt called back and AWV completed

## 2022-12-27 ENCOUNTER — Ambulatory Visit (INDEPENDENT_AMBULATORY_CARE_PROVIDER_SITE_OTHER): Payer: Medicare HMO

## 2022-12-27 DIAGNOSIS — I428 Other cardiomyopathies: Secondary | ICD-10-CM

## 2022-12-27 LAB — CUP PACEART REMOTE DEVICE CHECK
Battery Remaining Longevity: 78 mo
Battery Voltage: 3.01 V
Brady Statistic AP VP Percent: 0.03 %
Brady Statistic AP VS Percent: 0.01 %
Brady Statistic AS VP Percent: 98.72 %
Brady Statistic AS VS Percent: 1.23 %
Brady Statistic RA Percent Paced: 0.04 %
Brady Statistic RV Percent Paced: 4.16 %
Date Time Interrogation Session: 20240220052505
HighPow Impedance: 55 Ohm
Implantable Lead Connection Status: 753985
Implantable Lead Connection Status: 753985
Implantable Lead Connection Status: 753985
Implantable Lead Implant Date: 20160409
Implantable Lead Implant Date: 20160409
Implantable Lead Implant Date: 20160409
Implantable Lead Location: 753858
Implantable Lead Location: 753859
Implantable Lead Location: 753860
Implantable Lead Model: 4598
Implantable Lead Model: 5076
Implantable Pulse Generator Implant Date: 20230523
Lead Channel Impedance Value: 1121 Ohm
Lead Channel Impedance Value: 1197 Ohm
Lead Channel Impedance Value: 1197 Ohm
Lead Channel Impedance Value: 262.946
Lead Channel Impedance Value: 266.667
Lead Channel Impedance Value: 286.508
Lead Channel Impedance Value: 324.377
Lead Channel Impedance Value: 330.057
Lead Channel Impedance Value: 361 Ohm
Lead Channel Impedance Value: 361 Ohm
Lead Channel Impedance Value: 456 Ohm
Lead Channel Impedance Value: 475 Ohm
Lead Channel Impedance Value: 589 Ohm
Lead Channel Impedance Value: 608 Ohm
Lead Channel Impedance Value: 722 Ohm
Lead Channel Impedance Value: 931 Ohm
Lead Channel Impedance Value: 950 Ohm
Lead Channel Impedance Value: 988 Ohm
Lead Channel Pacing Threshold Amplitude: 0.375 V
Lead Channel Pacing Threshold Amplitude: 0.75 V
Lead Channel Pacing Threshold Amplitude: 1.875 V
Lead Channel Pacing Threshold Pulse Width: 0.4 ms
Lead Channel Pacing Threshold Pulse Width: 0.4 ms
Lead Channel Pacing Threshold Pulse Width: 1 ms
Lead Channel Sensing Intrinsic Amplitude: 0.75 mV
Lead Channel Sensing Intrinsic Amplitude: 0.75 mV
Lead Channel Sensing Intrinsic Amplitude: 8.75 mV
Lead Channel Sensing Intrinsic Amplitude: 8.75 mV
Lead Channel Setting Pacing Amplitude: 1.5 V
Lead Channel Setting Pacing Amplitude: 2 V
Lead Channel Setting Pacing Amplitude: 2.5 V
Lead Channel Setting Pacing Pulse Width: 0.4 ms
Lead Channel Setting Pacing Pulse Width: 1 ms
Lead Channel Setting Sensing Sensitivity: 0.3 mV
Zone Setting Status: 755011

## 2022-12-29 ENCOUNTER — Encounter: Payer: Self-pay | Admitting: Podiatry

## 2022-12-29 ENCOUNTER — Ambulatory Visit: Payer: Medicare HMO | Admitting: Podiatry

## 2022-12-29 VITALS — BP 108/59 | HR 74

## 2022-12-29 DIAGNOSIS — B351 Tinea unguium: Secondary | ICD-10-CM

## 2022-12-29 DIAGNOSIS — E119 Type 2 diabetes mellitus without complications: Secondary | ICD-10-CM | POA: Diagnosis not present

## 2022-12-29 DIAGNOSIS — M79674 Pain in right toe(s): Secondary | ICD-10-CM

## 2022-12-29 NOTE — Progress Notes (Signed)
This patient returns to my office for at risk foot care.  This patient requires this care by a professional since this patient will be at risk due to having type 2 diabetes and thrombocytopenia..  This patient is unable to cut nails herself since the patient cannot reach her nails.These nails are painful walking and wearing shoes.  This patient presents for at risk foot care today.  General Appearance  Alert, conversant and in no acute stress.  Vascular  Dorsalis pedis  are palpable  Bilaterally. Posterior tibial pulses are absent  B/L. Capillary return is within normal limits  bilaterally. Cold feet  Bilaterally.  Absent digital hair.  Neurologic  Senn-Weinstein monofilament wire test within normal limits  bilaterally. Muscle power within normal limits bilaterally.  Nails Thick disfigured discolored nails with subungual debris with incurvated nails both borders right hallux.. No evidence of bacterial infection or drainage bilaterally.  Orthopedic  No limitations of motion  feet .  No crepitus or effusions noted.  No bony pathology or digital deformities noted.  Skin  Thin skin  skin with no porokeratosis noted bilaterally.  No signs of infections or ulcers noted.   Thin shiny skin.  Onychomycosis  Pain in right toes  Pain in left toes  Consent was obtained for treatment procedures.   Mechanical debridement of nail both hallux. performed with a nail nipper.  Filed with dremel without incident.    Return office visit   3 months                   Told patient to return for periodic foot care and evaluation due to potential at risk complications.   Gardiner Barefoot DPM

## 2022-12-30 ENCOUNTER — Encounter: Payer: Self-pay | Admitting: Oncology

## 2022-12-30 ENCOUNTER — Inpatient Hospital Stay: Payer: Medicare HMO | Attending: Oncology | Admitting: Oncology

## 2022-12-30 VITALS — BP 97/64 | HR 76 | Temp 96.4°F | Resp 18 | Wt 114.4 lb

## 2022-12-30 DIAGNOSIS — Z79899 Other long term (current) drug therapy: Secondary | ICD-10-CM | POA: Insufficient documentation

## 2022-12-30 DIAGNOSIS — R768 Other specified abnormal immunological findings in serum: Secondary | ICD-10-CM

## 2022-12-30 DIAGNOSIS — Z8042 Family history of malignant neoplasm of prostate: Secondary | ICD-10-CM | POA: Diagnosis not present

## 2022-12-30 DIAGNOSIS — I509 Heart failure, unspecified: Secondary | ICD-10-CM | POA: Insufficient documentation

## 2022-12-30 DIAGNOSIS — Z7982 Long term (current) use of aspirin: Secondary | ICD-10-CM | POA: Diagnosis not present

## 2022-12-30 DIAGNOSIS — Z79890 Hormone replacement therapy (postmenopausal): Secondary | ICD-10-CM | POA: Diagnosis not present

## 2022-12-30 DIAGNOSIS — Z7984 Long term (current) use of oral hypoglycemic drugs: Secondary | ICD-10-CM | POA: Insufficient documentation

## 2022-12-30 DIAGNOSIS — K219 Gastro-esophageal reflux disease without esophagitis: Secondary | ICD-10-CM | POA: Diagnosis not present

## 2022-12-30 DIAGNOSIS — E039 Hypothyroidism, unspecified: Secondary | ICD-10-CM | POA: Diagnosis not present

## 2022-12-30 DIAGNOSIS — E785 Hyperlipidemia, unspecified: Secondary | ICD-10-CM | POA: Insufficient documentation

## 2022-12-30 DIAGNOSIS — D696 Thrombocytopenia, unspecified: Secondary | ICD-10-CM | POA: Diagnosis not present

## 2022-12-30 DIAGNOSIS — I420 Dilated cardiomyopathy: Secondary | ICD-10-CM | POA: Insufficient documentation

## 2022-12-30 DIAGNOSIS — E119 Type 2 diabetes mellitus without complications: Secondary | ICD-10-CM | POA: Diagnosis not present

## 2022-12-30 NOTE — Progress Notes (Signed)
Hematology/Oncology Progress note [phone] [phone]        CHIEF COMPLAINTS/REASON FOR VISIT:  Evaluation of thrombocytopenia  ASSESSMENT & PLAN:   Thrombocytopenia (HCC) Chronic thrombocytopenia, physical examination did not reveal any significant splenomegaly. Labs are reviewed and discussed with patient. Adequate B12, folate,normal immature platelet fraction, no M protein on myeloma panel, normal light chain ratio, B cell clonality cannot be evaluated and no other significant immunophenotypic abnormality detected on  flow cytometry, elevated LDH, negative hepatitis panel, negative HIV Suspect decreased marrow production.  Check US abdomen.    Positive ANA (antinuclear antibody) Refer to rheumatology for evaluation,    Orders Placed This Encounter  Procedures   US Abdomen Complete    Standing Status:   Future    Standing Expiration Date:   12/30/2023    Order Specific Question:   Reason for Exam (SYMPTOM  OR DIAGNOSIS REQUIRED)    Answer:   Thrombocytopenia    Order Specific Question:   Preferred imaging location?    Answer:   [facility]   CBC with Differential/Platelet    Standing Status:   Future    Standing Expiration Date:   12/30/2023   CMP ([facility] only)    Standing Status:   Future    Standing Expiration Date:   12/31/2023   Technologist smear review    Standing Status:   Future    Standing Expiration Date:   12/31/2023    Order Specific Question:   Clinical information:    Answer:   Thrombocytopenia   Ambulatory referral to Rheumatology    Referral Priority:   Routine    Referral Type:   Consultation    Referral Reason:   Specialty Services Required    Referred to Provider:   Quintin Alto, MD    Requested Specialty:   Rheumatology    Number of Visits Requested:   1   Follow-up 6 months All questions were answered. The patient knows to call the clinic with any problems, questions or concerns.  Earlie Server, MD,  PhD [facility] Hematology Oncology 12/30/2022    HISTORY OF PRESENTING ILLNESS:  Bethany [name] is a 77 y.o. female who was seen in consultation at the request of Caryn Section, Kirstie Peri, MD for evaluation of thrombocytopenia Patient has a long standing thrombocytopenia history since at least 2014. Her counts fluctuates, ranging from 90s to 120s.  Denies weight loss, fever, chills, fatigue, night sweats.  Denies hematochezia, hematuria, hematemesis, epistaxis, black tarry stool.  Patient has no easy bruising.    Denies history hepatitis or HIV infection Denies history of chronic liver disease Denies routine alcohol consumption. Denies dietary restrictions.   INTERVAL HISTORY Bethany [name] is a 77 y.o. female who has above history reviewed by me today presents for follow up visit for thrombocytopenia She presents to discuss results.  No new complaints.  Occasional joint pain, denies any morning stiffness.    MEDICAL HISTORY:  Past Medical History:  Diagnosis Date   AICD (automatic cardioverter/defibrillator) present    Anemia    Anxiety    CHF (congestive heart failure) (HCC)    Complication of anesthesia    Depression    Diabetes ([place])    Dilated cardiomyopathy (HCC)    Diverticulosis    Dyspnea    Dysrhythmia    Edema    FEET/ANKLES OCCAS   GERD (gastroesophageal reflux disease)    Hyperlipidemia    Hypothyroidism    LV dysfunction    Orthopnea  USES 3 PILLOWS   PONV (postoperative nausea and vomiting)    Reflux    Thrombocytopenia (HCC)    Thyroid disease    VHD (valvular heart disease)     SURGICAL HISTORY: Past Surgical History:  Procedure Laterality Date   ABDOMINAL HYSTERECTOMY     BI-VENTRICULAR IMPLANTABLE CARDIOVERTER DEFIBRILLATOR [name] 02/11/2015   Procedure: BI-VENTRICULAR IMPLANTABLE CARDIOVERTER DEFIBRILLATOR  (CRT-D);  Surgeon: Deboraha Sprang, MD;  Location: [facility] CATH LAB;  Service: Cardiovascular;  Laterality: [name];   BONE MARROW BIOPSY  2007    spine   BREAST BIOPSY Right    neg   BREAST EXCISIONAL BIOPSY Right    CARDIAC CATHETERIZATION     CATARACT EXTRACTION W/PHACO Right 01/03/2017   Procedure: CATARACT EXTRACTION PHACO AND INTRAOCULAR LENS PLACEMENT (IOC);  Surgeon: Leandrew Koyanagi, MD;  Location: [facility] ORS;  Service: Ophthalmology;  Laterality: Right;  [place] 00:50 AP% 19.5 CDE 9.94 fluid pack lot # [id number]   CATARACT EXTRACTION W/PHACO Left 10/27/2016   Procedure: CATARACT EXTRACTION PHACO AND INTRAOCULAR LENS PLACEMENT (IOC);  Surgeon: Leandrew Koyanagi, MD;  Location: [facility] ORS;  Service: Ophthalmology;  Laterality: Left;  [place]  01:12 AP%  21.0 CDE  11.59 Fluid pack lot # [id number]   CHOLECYSTECTOMY     ICD GENERATOR CHANGEOUT [name] 03/29/2022   Procedure: ICD GENERATOR CHANGEOUT;  Surgeon: Deboraha Sprang, MD;  Location: [facility] CV LAB;  Service: Cardiovascular;  Laterality: [name];   INSERT / REPLACE / REMOVE PACEMAKER     AICD   LEAD REVISION [name] 02/11/2015   Procedure: LEAD REVISION;  Surgeon: Deboraha Sprang, MD;  Location: [facility] CATH LAB;  Service: Cardiovascular;  Laterality: [name];   PARTIAL HYSTERECTOMY  1977   TOOTH EXTRACTION  09/23/2015    SOCIAL HISTORY: Social History   Socioeconomic History   Marital status: Divorced    Spouse name: Not on file   Number of children: 4   Years of education: Not on file   Highest education level: GED or equivalent  Occupational History   Occupation: retired  Tobacco Use   Smoking status: Never   Smokeless tobacco: Former    Types: Snuff    Quit date: 10/27/1969  Vaping Use   Vaping Use: Never used  Substance and Sexual Activity   Alcohol use: No    Alcohol/week: 0.0 standard drinks of alcohol   Drug use: No   Sexual activity: Not on file  Other Topics Concern   Not on file  Social History Narrative   Not on file   Social Determinants of Health   Financial Resource Strain: Low Risk  (12/20/2022)   Overall Financial Resource Strain (CARDIA)     Difficulty of Paying Living Expenses: Not hard at all  Food Insecurity: No Food Insecurity (05/24/2022)   Hunger Vital Sign    Worried About Running Out of Food in the Last Year: Never true    [place] in the Last Year: Never true  Transportation Needs: No Transportation Needs (12/20/2022)   PRAPARE - Hydrologist (Medical): No    Lack of Transportation (Non-Medical): No  Physical Activity: Insufficiently Active (12/20/2022)   Exercise Vital Sign    Days of Exercise per Week: 3 days    Minutes of Exercise per Session: 30 min  Stress: No Stress Concern Present (12/20/2022)   [place]    Feeling of Stress : Only a little  Social Connections: Moderately  Integrated (12/20/2022)   Social Connection and Isolation Panel [NHANES]    Frequency of Communication with Friends and Family: More than three times a week    Frequency of Social Gatherings with Friends and Family: More than three times a week    Attends Religious Services: More than 4 times per year    Active Member of [employer] or Organizations: Yes    Attends Music therapist: More than 4 times per year    Marital Status: Divorced  Intimate Partner Violence: Not At Risk (12/20/2022)   Humiliation, Afraid, Rape, and Kick questionnaire    Fear of Current or Ex-Partner: No    Emotionally Abused: No    Physically Abused: No    Sexually Abused: No    FAMILY HISTORY: Family History  Problem Relation Age of Onset   Heart attack Mother    Prostate cancer Father    Ulcers Father    Heart disease Sister    Stroke Sister    Heart attack Sister    Diabetes Sister    Heart disease Sister    Diabetes Sister    Heart attack Sister    Diabetes Sister    Diabetes Sister    Diabetes Sister    Diabetes Brother    Heart attack Brother    Stroke Brother    Breast cancer Neg Hx     ALLERGIES:  is allergic to other and sulfa  antibiotics.  MEDICATIONS:  Current Outpatient Medications  Medication Sig Dispense Refill   acetaminophen (TYLENOL) 325 MG tablet Take 2 tablets (650 mg total) by mouth every 6 (six) hours as needed for moderate pain. 90 tablet 0   Alcohol Swabs (B-D SINGLE USE SWABS REGULAR) PADS USE TO CHECK BLOOD SUGAR DAILY 100 each 4   aspirin EC 81 MG tablet Take 81 mg by mouth daily.     atorvastatin (LIPITOR) 20 MG tablet TAKE 1 TABLET EVERY DAY 90 tablet 4   Blood Glucose Calibration (TRUE METRIX LEVEL 1) Low SOLN Use as directed with glucose meter 1 each 3   carvedilol (COREG) 3.125 MG tablet TAKE 1 TABLET TWICE DAILY WITH MEALS 180 tablet 4   dapagliflozin propanediol (FARXIGA) 5 MG TABS tablet TAKE 1 TABLET EVERY DAY BEFORE BREAKFAST 90 tablet 3   ferrous sulfate 325 (65 FE) MG tablet Take 325 mg by mouth 2 (two) times daily with a meal.     furosemide (LASIX) 40 MG tablet Take 1 tablet (40 mg) by mouth every other day 30 tablet    glucose blood (TRUE METRIX BLOOD GLUCOSE TEST) test strip CHECK BLOOD SUGAR EVERY DAY 100 strip 3   JANUMET XR [phone] MG TB24 TAKE 1 TABLET EVERY DAY 90 tablet 4   levothyroxine (SYNTHROID) 50 MCG tablet Take 1 tablet (50 mcg total) by mouth daily. 90 tablet 3   Multiple Vitamin (MULTIVITAMIN) tablet Take 1 tablet by mouth daily.     pantoprazole (PROTONIX) 40 MG tablet TAKE 1 TABLET EVERY DAY 90 tablet 4   sacubitril-valsartan (ENTRESTO) 24-26 MG Take 0.5 tablets by mouth 2 (two) times daily.     senna (SENOKOT) 8.6 MG tablet Take 1 tablet by mouth daily as needed.     cyanocobalamin 100 MCG tablet Take 100 mcg by mouth daily. (Patient not taking: Reported on 12/30/2022)     TRUEplus Lancets 33G MISC CHECK BLOOD SUGAR ONE TIME DAILY (Patient not taking: Reported on 12/30/2022) 100 each 4   No current facility-administered medications for this visit.  Review of Systems  Constitutional:  Negative for appetite change, chills, fatigue and fever.  HENT:   Negative  for hearing loss and voice change.   Eyes:  Negative for eye problems.  Respiratory:  Negative for chest tightness and cough.   Cardiovascular:  Negative for chest pain.  Gastrointestinal:  Negative for abdominal distention, abdominal pain and blood in stool.  Endocrine: Negative for hot flashes.  Genitourinary:  Negative for difficulty urinating and frequency.   Musculoskeletal:  Negative for arthralgias.  Skin:  Negative for itching and rash.  Neurological:  Negative for extremity weakness.  Hematological:  Negative for adenopathy.  Psychiatric/Behavioral:  Negative for confusion.     PHYSICAL EXAMINATION: ECOG PERFORMANCE STATUS: 0 - Asymptomatic Vitals:   12/30/22 1040  BP: 97/64  Pulse: 76  Resp: 18  Temp: (!) 96.4 F (35.8 C)   Filed Weights   12/30/22 1040  Weight: 114 lb 6.4 oz (51.9 kg)    Physical Exam Constitutional:      General: She is not in acute distress. HENT:     Head: Normocephalic and atraumatic.  Eyes:     General: No scleral icterus. Cardiovascular:     Rate and Rhythm: Normal rate and regular rhythm.     Heart sounds: Normal heart sounds.  Pulmonary:     Effort: Pulmonary effort is normal. No respiratory distress.     Breath sounds: No wheezing.  Abdominal:     General: Bowel sounds are normal. There is no distension.     Palpations: Abdomen is soft.  Musculoskeletal:        General: No deformity. Normal range of motion.     Cervical back: Normal range of motion and neck supple.  Skin:    General: Skin is warm and dry.     Findings: No erythema or rash.  Neurological:     Mental Status: She is alert and oriented to person, place, and time. Mental status is at baseline.     Cranial Nerves: No cranial nerve deficit.     Coordination: Coordination normal.  Psychiatric:        Mood and Affect: Mood normal.      LABORATORY DATA:  I have reviewed the data as listed    Latest Ref Rng & Units 12/01/2022    9:44 AM 11/25/2022    2:26 PM  09/02/2022    8:29 AM  CBC  WBC 4.0 - 10.5 K/uL 3.3  3.4  3.4   Hemoglobin 12.0 - 15.0 g/dL 12.0  12.5  12.1   Hematocrit 36.0 - 46.0 % 38.4  38.0  38.0   Platelets 150 - 400 K/uL 94  94  93       Latest Ref Rng & Units 11/25/2022    2:26 PM 09/02/2022    8:29 AM 05/26/2022   10:19 AM  CMP  Glucose 70 - 99 mg/dL 181  120  163   BUN 8 - 27 mg/dL '9  13  8   '$ Creatinine 0.57 - 1.00 mg/dL 0.94  0.83  0.82   Sodium 134 - 144 mmol/L 143  139  139   Potassium 3.5 - 5.2 mmol/L 3.8  3.8  3.9   Chloride 96 - 106 mmol/L 101  104  99   CO2 20 - 29 mmol/L '29  29  25   '$ Calcium 8.7 - 10.3 mg/dL 10.0  9.4  10.0   Total Protein 6.0 - 8.5 g/dL 7.4   7.2   Total  Bilirubin 0.0 - 1.2 mg/dL 0.3   0.6   Alkaline Phos 44 - 121 IU/L 87   91   AST 0 - 40 IU/L 19   19   ALT 0 - 32 IU/L 10   7      RADIOGRAPHIC STUDIES: I have personally reviewed the radiological images as listed and agreed with the findings in the report.  CUP PACEART REMOTE DEVICE CHECK  Result Date: 12/27/2022 Scheduled remote reviewed. Normal device function.  Next remote 91 days. LA

## 2022-12-30 NOTE — Assessment & Plan Note (Signed)
Refer to rheumatology for evaluation,

## 2022-12-30 NOTE — Assessment & Plan Note (Addendum)
Chronic thrombocytopenia, physical examination did not reveal any significant splenomegaly. Labs are reviewed and discussed with patient. Adequate B12, folate,normal immature platelet fraction, no M protein on myeloma panel, normal light chain ratio, B cell clonality cannot be evaluated and no other significant immunophenotypic abnormality detected on  flow cytometry, elevated LDH, negative hepatitis panel, negative HIV Suspect decreased marrow production.  Check US abdomen.

## 2023-01-02 ENCOUNTER — Ambulatory Visit
Admission: RE | Admit: 2023-01-02 | Discharge: 2023-01-02 | Disposition: A | Discharge status: Home / Self Care | Payer: Medicare HMO | Source: Ambulatory Visit | Attending: Oncology | Admitting: Oncology

## 2023-01-02 DIAGNOSIS — D696 Thrombocytopenia, unspecified: Secondary | ICD-10-CM | POA: Diagnosis not present

## 2023-01-09 ENCOUNTER — Telehealth: Payer: Self-pay

## 2023-01-09 NOTE — Progress Notes (Signed)
Care Management & Coordination Services Pharmacy Team Pharmacy Assistant   Name: Bethany [name]  MRN: [id number] DOB: 07-16-1946  Reason for Encounter: Diabetes  Contacted patient to discuss diabetes disease state. Spoke with patient on 01/09/2023   Chart Updates: Recent office visits:  12/20/2022 Laqueta Jean, LPN (Clinical Support Visit) for Medicare Wellness Exam- No medication changes noted, No orders placed,   11/25/2022 Mikey Kirschner, PA-C (PCP Office Visit) for Weakness- No medication changes noted, Lab orders placed, No follow-up noted  Recent consult visits:  12/30/2022 Earlie Server, MD (Oncology) for Follow-up- No medication changes noted, Lab orders placed, US Abdomen order placed, Referral to Rheumatology order placed, No follow noted  12/29/2022 Gardiner Barefoot, DPM (Podiatry) for Nail Problem- No medication changes noted, No orders placed, Patient to follow-up in 3 months  12/01/2022 Earlie Server, MD (Oncology) for Initial visit- No medication changes noted, Lab orders placed, No follow-up noted  Hospital visits:  None in previous 6 months  Medications: Outpatient Encounter Medications as of 01/09/2023  Medication Sig   acetaminophen (TYLENOL) 325 MG tablet Take 2 tablets (650 mg total) by mouth every 6 (six) hours as needed for moderate pain.   Alcohol Swabs (B-D SINGLE USE SWABS REGULAR) PADS USE TO CHECK BLOOD SUGAR DAILY   aspirin EC 81 MG tablet Take 81 mg by mouth daily.   atorvastatin (LIPITOR) 20 MG tablet TAKE 1 TABLET EVERY DAY   Blood Glucose Calibration (TRUE METRIX LEVEL 1) Low SOLN Use as directed with glucose meter   carvedilol (COREG) 3.125 MG tablet TAKE 1 TABLET TWICE DAILY WITH MEALS   cyanocobalamin 100 MCG tablet Take 100 mcg by mouth daily. (Patient not taking: Reported on 12/30/2022)   dapagliflozin propanediol (FARXIGA) 5 MG TABS tablet TAKE 1 TABLET EVERY DAY BEFORE BREAKFAST   ferrous sulfate 325 (65 FE) MG tablet Take 325 mg by mouth 2 (two) times daily  with a meal.   furosemide (LASIX) 40 MG tablet Take 1 tablet (40 mg) by mouth every other day   glucose blood (TRUE METRIX BLOOD GLUCOSE TEST) test strip CHECK BLOOD SUGAR EVERY DAY   JANUMET XR [phone] MG TB24 TAKE 1 TABLET EVERY DAY   levothyroxine (SYNTHROID) 50 MCG tablet Take 1 tablet (50 mcg total) by mouth daily.   Multiple Vitamin (MULTIVITAMIN) tablet Take 1 tablet by mouth daily.   pantoprazole (PROTONIX) 40 MG tablet TAKE 1 TABLET EVERY DAY   sacubitril-valsartan (ENTRESTO) 24-26 MG Take 0.5 tablets by mouth 2 (two) times daily.   senna (SENOKOT) 8.6 MG tablet Take 1 tablet by mouth daily as needed.   TRUEplus Lancets 33G MISC CHECK BLOOD SUGAR ONE TIME DAILY (Patient not taking: Reported on 12/30/2022)   No facility-administered encounter medications on file as of 01/09/2023.   Recent Relevant Labs: Lab Results  Component Value Date/Time   HGBA1C 7.2 (H) 11/25/2022 02:26 PM   HGBA1C 6.9 (H) 05/26/2022 10:19 AM   HGBA1C 6.3 02/20/2014 07:57 PM   MICROALBUR negative 06/02/2020 10:36 AM   MICROALBUR negative 12/04/2018 11:50 AM    Kidney Function Lab Results  Component Value Date/Time   CREATININE 0.94 11/25/2022 02:26 PM   CREATININE 0.83 09/02/2022 08:29 AM   CREATININE 0.70 02/04/2015 02:26 PM   CREATININE 0.89 11/10/2014 11:47 AM   GFR 88.96 02/05/2015 10:34 AM   GFRNONAA >60 09/02/2022 08:29 AM   GFRNONAA >60 02/04/2015 02:26 PM   GFRAA 79 07/01/2020 11:26 AM   GFRAA >60 02/04/2015 02:26 PM   Current antihyperglycemic regimen:  Farxiga 5 mg daily Janumet [phone] mg daily    Patient verbally confirms she is taking the above medications as directed. Yes  What diet changes have been made to improve diabetes control? No diet changes made  What recent interventions/DTPs have been made to improve glycemic control:  None ID  Have there been any recent hospitalizations or ED visits since last visit with PharmD? No  Patient denies hypoglycemic symptoms, including  Pale, Sweaty, Shaky, Hungry, Nervous/irritable, and Vision changes  Patient denies hyperglycemic symptoms, including blurry vision, excessive thirst, fatigue, and polyuria  How often are you checking your blood sugar? once daily  What are your blood sugars ranging?  Fasting: 90-122 normal ranges  During the week, how often does your blood glucose drop below 70? Never  Are you checking your feet daily/regularly? Yes  Adherence Review: Is the patient currently on a STATIN medication? Yes Is the patient currently on ACE/ARB medication? Yes Does the patient have >5 day gap between last estimated fill dates? No  Star Rating Drugs:  Atorvastatin 20 mg Last filled on 12/27/20213 for a 90-Day supply with [place] 5 mg patient receives this medication via [employer] patient assistance program Janumet XR [phone] mg last filled on 12/21/2022 for a 90-Day supply with [place]: Annual wellness visit in last year? Yes Last eye exam / retinopathy screening: 08/24/2022 Last diabetic foot exam: Patient does see podiatry on the regular    Patient has a follow-up appointment with Junius Argyle, CPP on 02/06/2023 @ 1500

## 2023-01-14 ENCOUNTER — Other Ambulatory Visit: Payer: Self-pay | Admitting: Family Medicine

## 2023-01-14 DIAGNOSIS — E119 Type 2 diabetes mellitus without complications: Secondary | ICD-10-CM

## 2023-01-17 DIAGNOSIS — M7541 Impingement syndrome of right shoulder: Secondary | ICD-10-CM | POA: Diagnosis not present

## 2023-01-17 DIAGNOSIS — E119 Type 2 diabetes mellitus without complications: Secondary | ICD-10-CM | POA: Diagnosis not present

## 2023-01-18 ENCOUNTER — Other Ambulatory Visit (HOSPITAL_COMMUNITY): Payer: Self-pay | Admitting: Orthopedic Surgery

## 2023-01-18 DIAGNOSIS — M7541 Impingement syndrome of right shoulder: Secondary | ICD-10-CM

## 2023-01-26 NOTE — Progress Notes (Signed)
Remote ICD transmission.   

## 2023-02-06 ENCOUNTER — Ambulatory Visit: Payer: Medicare HMO

## 2023-02-06 DIAGNOSIS — I5022 Chronic systolic (congestive) heart failure: Secondary | ICD-10-CM

## 2023-02-06 DIAGNOSIS — E119 Type 2 diabetes mellitus without complications: Secondary | ICD-10-CM

## 2023-02-06 DIAGNOSIS — E2839 Other primary ovarian failure: Secondary | ICD-10-CM

## 2023-02-06 MED ORDER — DAPAGLIFLOZIN PROPANEDIOL 10 MG PO TABS: 10.0000 mg | ORAL_TABLET | Freq: Every day | ORAL | 3 refills | Status: DC

## 2023-02-06 NOTE — Progress Notes (Unsigned)
Care Management & Coordination Services Pharmacy Note  02/06/2023 Name:  Bethany [name] MRN:  [id number] DOB:  01-13-1946  Summary: Patient presents for follow-up.   -Patient reports worsening shortness of breath with minimal exertion. She denies worsening edema. Home weight had been stable around 108, but reports today it increased to 111. She reports adherence to her furosemide.   -Patient reports her home blood sugars have been more elevated recently. She will need a refill to continue getting Wilder Glade through her [facility].   Recommendations/Changes made from today's visit:  -Take extra furosemide today, resume furosemide 40 mg every other day afterwards.  -Increase Farxiga to 10 mg daily   -Recheck DEXA   Follow up plan: CPP follow-up 3 months   Subjective: Bethany [name] is an 77 y.o. year old female who is a primary patient of Caryn Section, Kirstie Peri, MD.  The care coordination team was consulted for assistance with disease management and care coordination needs.    Engaged with patient by telephone for follow up visit.  Recent office visits: 11/25/22: Patient presented to Mikey Kirschner, PA-C for follow-up.   Recent consult visits: 12/30/22: Patient presented to Dr. Tasia Catchings (Oncology) for thrombocytopenia.  12/29/22: Patient presented to Dr. Prudence Davidson (Podiatry) for follow-up.  09/07/22: Patient presented to Leroy Libman, [place] (Cardiology) for follow-up.  Hospital visits: 09/02/22: Patient presented to ED for chest pain.    Objective:  Lab Results  Component Value Date   CREATININE 0.94 11/25/2022   BUN 9 11/25/2022   GFR 88.96 02/05/2015   EGFR 63 11/25/2022   GFRNONAA >60 09/02/2022   GFRAA 79 07/01/2020   NA 143 11/25/2022   K 3.8 11/25/2022   CALCIUM 10.0 11/25/2022   CO2 29 11/25/2022   GLUCOSE 181 (H) 11/25/2022    Lab Results  Component Value Date/Time   HGBA1C 7.2 (H) 11/25/2022 02:26 PM   HGBA1C 6.9 (H) 05/26/2022 10:19 AM   HGBA1C 6.3 02/20/2014 07:57 PM    GFR 88.96 02/05/2015 10:34 AM   MICROALBUR negative 06/02/2020 10:36 AM   MICROALBUR negative 12/04/2018 11:50 AM    Last diabetic Eye exam:  Lab Results  Component Value Date/Time   HMDIABEYEEXA No Retinopathy 08/24/2022 12:00 AM    Last diabetic Foot exam: No results found for: "HMDIABFOOTEX"   Lab Results  Component Value Date   CHOL 120 01/13/2021   HDL 55 01/13/2021   LDLCALC 49 01/13/2021   TRIG 79 01/13/2021   CHOLHDL 2.2 01/13/2021       Latest Ref Rng & Units 11/25/2022    2:26 PM 05/26/2022   10:19 AM 01/26/2022   10:11 AM  Hepatic Function  Total Protein 6.0 - 8.5 g/dL 7.4  7.2  6.6   Albumin 3.8 - 4.8 g/dL 4.5  4.3  4.2   AST 0 - 40 IU/L 19  19  18    ALT 0 - 32 IU/L 10  7  11    Alk Phosphatase 44 - 121 IU/L 87  91  104   Total Bilirubin 0.0 - 1.2 mg/dL 0.3  0.6  0.3     Lab Results  Component Value Date/Time   TSH 2.520 06/27/2022 10:56 AM   TSH 3.440 05/26/2022 10:19 AM   FREET4 1.32 06/27/2022 10:56 AM   FREET4 1.38 05/26/2022 10:19 AM       Latest Ref Rng & Units 12/01/2022    9:44 AM 11/25/2022    2:26 PM 09/02/2022    8:29 AM  CBC  WBC  4.0 - 10.5 K/uL 3.3  3.4  3.4   Hemoglobin 12.0 - 15.0 g/dL 12.0  12.5  12.1   Hematocrit 36.0 - 46.0 % 38.4  38.0  38.0   Platelets 150 - 400 K/uL 94  94  93     Lab Results  Component Value Date/Time   VD25OH 50.6 01/26/2022 10:11 AM   VD25OH 99.0 01/13/2021 08:15 AM   VITAMINB12 1,268 (H) 12/01/2022 09:45 AM   VITAMINB12 377 06/02/2020 11:05 AM    Clinical ASCVD: No  The ASCVD Risk score (Arnett DK, et al., 2019) failed to calculate for the following reasons:   The valid total cholesterol range is 130 to 320 mg/dL       12/20/2022    3:18 PM 11/25/2022    1:27 PM 05/24/2022    1:07 PM  Depression screen PHQ 2/9  Decreased Interest 0 0 0  Down, Depressed, Hopeless 0 0 0  PHQ - 2 Score 0 0 0  Altered sleeping 0 0   Tired, decreased energy 0 0   Change in appetite 0 0   Feeling bad or failure about  yourself  0 0   Trouble concentrating 0 0   Moving slowly or fidgety/restless 0 0   Suicidal thoughts 0 0   PHQ-9 Score 0 0      Social History   Tobacco Use  Smoking Status Never  Smokeless Tobacco Former   Types: Snuff   Quit date: 10/27/1969   BP Readings from Last 3 Encounters:  12/30/22 97/64  12/29/22 (!) 108/59  12/01/22 105/68   Pulse Readings from Last 3 Encounters:  12/30/22 76  12/29/22 74  12/01/22 76   Wt Readings from Last 3 Encounters:  12/30/22 114 lb 6.4 oz (51.9 kg)  12/20/22 116 lb (52.6 kg)  12/01/22 116 lb 1.6 oz (52.7 kg)   BMI Readings from Last 3 Encounters:  12/30/22 21.62 kg/m  12/20/22 21.92 kg/m  12/01/22 21.94 kg/m    Allergies  Allergen Reactions   Other Other (See Comments)    NO BLOOD PRODUCTS- JEHOVAHS WITNESS   Sulfa Antibiotics Other (See Comments)    Loss of taste    Medications Reviewed Today     Reviewed by Evelina Dun, RN (Registered Nurse) on 12/30/22 at 77  Med List Status: <None>   Medication Order Taking? Sig Documenting Provider Last Dose Status Informant  acetaminophen (TYLENOL) 325 MG tablet [id number]  Take 2 tablets (650 mg total) by mouth every 6 (six) hours as needed for moderate pain. Duffy Bruce, MD  Active   Alcohol Swabs (B-D SINGLE USE SWABS REGULAR) PADS [id number]  USE TO CHECK BLOOD SUGAR DAILY Birdie Sons, MD  Active   aspirin EC 81 MG tablet [id number]  Take 81 mg by mouth daily. [provider]  Active Self  atorvastatin (LIPITOR) 20 MG tablet [id number]  TAKE 1 TABLET EVERY DAY Birdie Sons, MD  Active   Blood Glucose Calibration (TRUE METRIX LEVEL 1) Low SOLN [id number]  Use as directed with glucose meter Birdie Sons, MD  Active   carvedilol (COREG) 3.125 MG tablet [id number]  TAKE 1 TABLET TWICE DAILY WITH MEALS Birdie Sons, MD  Active   cyanocobalamin 100 MCG tablet [id number]  Take 100 mcg by mouth daily. [provider]  Active   dapagliflozin  propanediol (FARXIGA) 5 MG TABS tablet [id number]  TAKE 1 TABLET EVERY DAY BEFORE BREAKFAST Fisher, Kirstie Peri, MD  Active   ferrous  sulfate 325 (65 FE) MG tablet [id number]  Take 325 mg by mouth 2 (two) times daily with a meal. [provider]  Active Self  furosemide (LASIX) 40 MG tablet [id number]  Take 1 tablet (40 mg) by mouth every other day Deboraha Sprang, MD  Active   glucose blood (TRUE METRIX BLOOD GLUCOSE TEST) test strip [id number]  CHECK BLOOD SUGAR EVERY DAY Birdie Sons, MD  Active   JANUMET XR [id number] MG TB24 [id number]  TAKE 1 TABLET EVERY DAY Birdie Sons, MD  Active   levothyroxine (SYNTHROID) 50 MCG tablet [id number]  Take 1 tablet (50 mcg total) by mouth daily. Birdie Sons, MD  Active   Multiple Vitamin (MULTIVITAMIN) tablet [id number]  Take 1 tablet by mouth daily. [provider]  Active Self  pantoprazole (PROTONIX) 40 MG tablet [id number]  TAKE 1 TABLET EVERY DAY Birdie Sons, MD  Active   sacubitril-valsartan (ENTRESTO) 24-26 MG [id number]  Take 0.5 tablets by mouth 2 (two) times daily. [provider]  Active   senna (SENOKOT) 8.6 MG tablet [id number]  Take 1 tablet by mouth daily as needed. [provider]  Active   TRUEplus Lancets 33G [place] [id number]  [place] BLOOD SUGAR ONE TIME DAILY Caryn Section Kirstie Peri, MD  Active             SDOH:  (Social Determinants of Health) assessments and interventions performed: Yes SDOH Interventions    Flowsheet Row Clinical Support from 12/20/2022 in [facility] Telephone from 04/27/2022 in [facility] Coordination Office Visit from 01/26/2022 in [facility] from 12/16/2021 in [facility] Management from 07/02/2021 in [facility] Management from 12/04/2020 in [facility]  SDOH Interventions         Food Insecurity Interventions -- Other (Comment)  [Spoke with patient about meals on wheels, but she is not interested at this time.  She is interested in having a list of food pantries emailed to [email or web address]. She has already received her Mom[employer] Meals benefit post discharge.] -- Intervention Not Indicated -- --  Housing Interventions -- -- -- Intervention Not Indicated -- --  Transportation Interventions Intervention Not Indicated -- -- Intervention Not Indicated -- Intervention Not Indicated  Alcohol Usage Interventions Intervention Not Indicated (Score <7) -- -- -- -- --  Depression Interventions/Treatment  -- -- Patient refuses Treatment -- -- --  Financial Strain Interventions Intervention Not Indicated -- -- Intervention Not Indicated Intervention Not Indicated Intervention Not Indicated  Physical Activity Interventions Intervention Not Indicated -- -- Intervention Not Indicated -- --  Stress Interventions Intervention Not Indicated -- -- Intervention Not Indicated -- --  Social Connections Interventions Intervention Not Indicated -- -- Intervention Not Indicated -- --       Medication Assistance: None required.  Patient affirms current coverage meets needs.  Medication Access: Within the past 30 days, how often has patient missed a dose of medication? None Is a pillbox or other method used to improve adherence? Yes  Factors that may affect medication adherence? no barriers identified Are meds synced by current pharmacy? No  Are meds delivered by current pharmacy? Yes  Does patient experience delays in picking up medications due to transportation concerns? No   [facility] Services Reviewed: Is patient disadvantaged to use [facility]?: Yes  Current Rx insurance plan: [employer] Name and location of Current pharmacy:  [id number]  Mail Delivery - [place], [address] [place] [address] Phone: [phone] Fax:  [phone]  [facility] [id number] - [address], [address] [place] [address] Phone: [phone] Fax: [phone]  [facility] Pharm[facility] services reviewed with patient today?: No  Patient requests to transfer care to [facility] Pharmacy?: No  Reason patient declined to change pharmacies: Disadvantaged due to insurance/mail order  Compliance/Adherence/Medication fill history: Care Gaps: Shingrix  DEXA  Covid  Foot Exam UACR   Star-Rating Drugs: Atorvastatin 20 mg Last filled on 01/16/23 for a 90-Day supply with [place] XR [phone] mg last filled on 12/21/2022 for a 90-Day supply with [facility]  Assessment/Plan  Heart Failure (Goal: manage symptoms and prevent exacerbations) -Controlled -Last ejection fraction: 35% (Date: Mar 2017) -HF type: Systolic -NYHA Class: III (slight limitation of activity) -AHA HF Stage: C (Heart disease and symptoms present) -Current treatment: Carvedilol 3.125 mg twice daily: Appropriate, Effective, Safe, Accessible  Entresto 24-26 1/2 tablet twice daily: Appropriate, Effective, Safe, Accessible Farxiga 10 mg daily    Furosemide 40 mg 1/2 tablet daily: Appropriate, Effective, Safe, Accessible   -Medications previously tried: NA  -Current home blood pressure readings: 0000000 systolic's -Educated on Importance of weighing daily; if you gain more than 3 pounds in one day or 5 pounds in one week, contact cardiology office  -Patient reports worsening shortness of breath with minimal exertion. She denies worsening edema. Home weight had been stable around 108, but reports today it increased to 111. She reports adherence to her furosemide.  -Take extra furosemide today, resume furosemide 40 mg every other day afterwards. -Increase Farxiga 10 mg daily.   Hyperlipidemia: (LDL goal < 100) -Controlled -Current treatment: Atorvastatin 20 mg daily  -Medications previously  tried: NA  -Recommended to continue current medication  Diabetes (A1c goal <8%) -Controlled -Current medications: Farxiga 5 mg daily: Appropriate, Effective, Safe, Accessible   Janumet [phone] mg daily: Appropriate, Effective, Safe, Accessible   -Medications previously tried: NA  -Current home glucose readings: 120-130s  -Denies hypoglycemic/hyperglycemic symptoms -Current meal patterns: Trying to minimize salt intake, carbohydrate intake  -Current exercise: Trying to get back into walking with sister -Patient reports her home blood sugars have been more elevated recently. She will need a refill to continue getting Farxiga through her [place] to 10 mg daily   Osteopenia (Goal Prevent bone fractures) -Controlled -Patient is not a candidate for pharmacologic treatment -Current treatment  None -Medications previously tried: NA  -Recommend [phone] units of vitamin D daily. Patient is due for DEXA.  -Recommended to continue current medication  Hypothyroidism (Goal: Maintain stable thyroid function) -Controlled -Current treatment  Synthroid 75 mcg daily  -Medications previously tried: NA  -Recommended to continue current medication  GERD (Goal: Prevent heartburn) -Controlled -Current treatment  Pantoprazole 40 mg daily -Medications previously tried: NA  -Recommended to continue current medication  Junius Argyle, PharmD, [place], [place] Pharmacist Practitioner  [facility] [phone]

## 2023-02-07 NOTE — Addendum Note (Signed)
Addended by: Lelon Huh E on: 02/07/2023 01:30 PM   Modules accepted: Orders

## 2023-02-10 ENCOUNTER — Telehealth: Payer: Self-pay

## 2023-02-10 NOTE — Progress Notes (Signed)
Care Coordination Pharmacy Assistant   Name: Bethany [name]  MRN: [id number] DOB: 04-Jun-1946  Reason for Encounter: HF and Weight Check   Patient was seen by CPP on she c/o shortness of breath and 3lbs of weight gain. CPP sent me a task to check in with her today to see how she was feeling and if her symptoms had resolved from Monday.  Per patient she is feeling better. She did increase her Furosemide the day she spoke with CPP and today her Shortness of breath has improved. Per patient it's her normal today. Patient also reports that she is down to 109 lbs from 111 lbs.   Patient has not started her Farxiga 10 mg daily increase as she was waiting on the prescription to be called in. I advised the patient that CPP electronically sent her prescription to [facility] and I will contact them to make sure they received the prescription and check status of delivery. Patient instructed that she can start taking 2 of her 5 mg tablets in the mean time. Patient instructed that if her weight goes up to 3 lbs over night, if she starts swelling, or having abnormal increased shortness of breath for her to give me a call immediately. Patient verbalized understanding.   I contacted [facility] and spoke with the pharmacy and they confirmed that they did have the new prescription but they have not gotten the okay from [facility]. I informed the pharmacy that I just spoke with [facility] and they confirmed that they did not have the new prescription on file. According to the Tech she is going resend the script to [facility] for approval today.  Medications: Outpatient Encounter Medications as of 02/10/2023  Medication Sig   acetaminophen (TYLENOL) 500 MG tablet Take 500 mg by mouth at bedtime as needed.   Alcohol Swabs (B-D SINGLE USE SWABS REGULAR) PADS USE TO CHECK BLOOD SUGAR DAILY   aspirin EC 81 MG tablet Take 81 mg by mouth daily.   atorvastatin (LIPITOR) 20 MG tablet TAKE 1 TABLET EVERY DAY   Blood Glucose Calibration (TRUE METRIX  LEVEL 1) Low SOLN Use as directed with glucose meter   carvedilol (COREG) 3.125 MG tablet TAKE 1 TABLET TWICE DAILY WITH MEALS   cyanocobalamin 100 MCG tablet Take 100 mcg by mouth daily. (Patient not taking: Reported on 12/30/2022)   dapagliflozin propanediol (FARXIGA) 10 MG TABS tablet Take 1 tablet (10 mg total) by mouth daily.   ferrous sulfate 325 (65 FE) MG tablet Take 325 mg by mouth 2 (two) times daily with a meal.   furosemide (LASIX) 40 MG tablet Take 1 tablet (40 mg) by mouth every other day   glucose blood (TRUE METRIX BLOOD GLUCOSE TEST) test strip CHECK BLOOD SUGAR EVERY DAY   JANUMET XR [phone] MG TB24 TAKE 1 TABLET EVERY DAY   levothyroxine (SYNTHROID) 50 MCG tablet Take 1 tablet (50 mcg total) by mouth daily.   Multiple Vitamin (MULTIVITAMIN) tablet Take 1 tablet by mouth daily.   pantoprazole (PROTONIX) 40 MG tablet TAKE 1 TABLET EVERY DAY   sacubitril-valsartan (ENTRESTO) 24-26 MG Take 0.5 tablets by mouth 2 (two) times daily.   senna (SENOKOT) 8.6 MG tablet Take 1 tablet by mouth daily as needed.   TRUEplus Lancets 33G MISC CHECK BLOOD SUGAR ONE TIME DAILY (Patient not taking: Reported on 12/30/2022)   No facility-administered encounter medications on file as of 02/10/2023.   Bethany [name], CPA/CMA Clinical Pharmacist Assistant Phone: [phone]

## 2023-02-22 ENCOUNTER — Other Ambulatory Visit: Payer: Self-pay | Admitting: Family Medicine

## 2023-02-22 DIAGNOSIS — E119 Type 2 diabetes mellitus without complications: Secondary | ICD-10-CM

## 2023-03-02 DIAGNOSIS — M25512 Pain in left shoulder: Secondary | ICD-10-CM | POA: Diagnosis not present

## 2023-03-02 DIAGNOSIS — G8929 Other chronic pain: Secondary | ICD-10-CM | POA: Diagnosis not present

## 2023-03-02 DIAGNOSIS — D696 Thrombocytopenia, unspecified: Secondary | ICD-10-CM | POA: Diagnosis not present

## 2023-03-02 DIAGNOSIS — M25511 Pain in right shoulder: Secondary | ICD-10-CM | POA: Diagnosis not present

## 2023-03-02 DIAGNOSIS — R768 Other specified abnormal immunological findings in serum: Secondary | ICD-10-CM | POA: Diagnosis not present

## 2023-03-07 DIAGNOSIS — I10 Essential (primary) hypertension: Secondary | ICD-10-CM | POA: Diagnosis not present

## 2023-03-07 DIAGNOSIS — I5022 Chronic systolic (congestive) heart failure: Secondary | ICD-10-CM | POA: Diagnosis not present

## 2023-03-07 DIAGNOSIS — R0602 Shortness of breath: Secondary | ICD-10-CM | POA: Diagnosis not present

## 2023-03-07 DIAGNOSIS — I2089 Other forms of angina pectoris: Secondary | ICD-10-CM | POA: Diagnosis not present

## 2023-03-07 DIAGNOSIS — I447 Left bundle-branch block, unspecified: Secondary | ICD-10-CM | POA: Diagnosis not present

## 2023-03-12 DIAGNOSIS — I5032 Chronic diastolic (congestive) heart failure: Secondary | ICD-10-CM | POA: Insufficient documentation

## 2023-03-12 NOTE — Progress Notes (Unsigned)
Cardiology Office Note Date:  03/14/2023  Patient ID:  Bethany [name], DOB 06-08-1946, MRN [id number] PCP:  Malva Limes, MD  Cardiologist:  Lamar Blinks, MD Electrophysiologist: [name], MD    Chief Complaint: 1 year CRT-D follow-up  History of Present Illness: Bethany [name] is a 77 y.o. female with PMH notable for NICM, LBBB, HFimpEF s/p CRT-D; seen today for [name], MD for routine electrophysiology followup.  Last saw Dr. Graciela Husbands 06/2022 she had been doing very well until the day prior when she had an episode of chest discomfort, SOB, diaphoresis and some nausea that lasted about half an hour.  She was euvolemic on exam.  He ordered a troponin that was negative. She was seen 02/2023 by PA [facility]) describing chest pressure with SOB, planning for updated echo and Lexiscan for further eval.  Both of those tests are currently pending.   Today, no significant changes since last being seen by [facility]. She tells me that she has intermittent "leaping" of her heart in her chest. No CP, SOB, dizziness when it happens. Doesn't happen everyday, but more than 1/week.    Device Information: Medtronic CRT-D, implanted 02/2015; Dx NICM, LBBB  Gen change 03/2022  Past Medical History:  Diagnosis Date   AICD (automatic cardioverter/defibrillator) present    Anemia    Anxiety    CHF (congestive heart failure) (HCC)    Complication of anesthesia    Depression    Diabetes (HCC)    Dilated cardiomyopathy (HCC)    Diverticulosis    Dyspnea    Dysrhythmia    Edema    FEET/ANKLES OCCAS   GERD (gastroesophageal reflux disease)    Hyperlipidemia    Hypothyroidism    LV dysfunction    Orthopnea    USES 3 PILLOWS   PONV (postoperative nausea and vomiting)    Reflux    Thrombocytopenia (HCC)    Thyroid disease    VHD (valvular heart disease)     Past Surgical History:  Procedure Laterality Date   ABDOMINAL HYSTERECTOMY     BI-VENTRICULAR IMPLANTABLE CARDIOVERTER  DEFIBRILLATOR [name] 02/11/2015   Procedure: BI-VENTRICULAR IMPLANTABLE CARDIOVERTER DEFIBRILLATOR  (CRT-D);  Surgeon: Duke Salvia, MD;  Location: [facility] CATH LAB;  Service: Cardiovascular;  Laterality: [name];   BONE MARROW BIOPSY  2007   spine   BREAST BIOPSY Right    neg   BREAST EXCISIONAL BIOPSY Right    CARDIAC CATHETERIZATION     CATARACT EXTRACTION W/PHACO Right 01/03/2017   Procedure: CATARACT EXTRACTION PHACO AND INTRAOCULAR LENS PLACEMENT (IOC);  Surgeon: Lockie Mola, MD;  Location: [facility] ORS;  Service: Ophthalmology;  Laterality: Right;  [place] 00:50 AP% 19.5 CDE 9.94 fluid pack lot # [id number]   CATARACT EXTRACTION W/PHACO Left 10/27/2016   Procedure: CATARACT EXTRACTION PHACO AND INTRAOCULAR LENS PLACEMENT (IOC);  Surgeon: Lockie Mola, MD;  Location: [facility] ORS;  Service: Ophthalmology;  Laterality: Left;  [place]  01:12 AP%  21.0 CDE  11.59 Fluid pack lot # [id number]   CHOLECYSTECTOMY     ICD GENERATOR CHANGEOUT [name] 03/29/2022   Procedure: ICD GENERATOR CHANGEOUT;  Surgeon: Duke Salvia, MD;  Location: [facility] INVASIVE CV LAB;  Service: Cardiovascular;  Laterality: [name];   INSERT / REPLACE / REMOVE PACEMAKER     AICD   LEAD REVISION [name] 02/11/2015   Procedure: LEAD REVISION;  Surgeon: Duke Salvia, MD;  Location: [facility] CATH LAB;  Service: Cardiovascular;  Laterality: [name];   PARTIAL HYSTERECTOMY  1977  TOOTH EXTRACTION  09/23/2015    Current Outpatient Medications  Medication Instructions   acetaminophen (TYLENOL) 500 mg, Oral, At bedtime PRN   Alcohol Swabs (DROPSAFE ALCOHOL PREP) 70 % PADS USE ONCE DAILY   aspirin EC 81 mg, Oral, Daily   atorvastatin (LIPITOR) 20 MG tablet TAKE 1 TABLET EVERY DAY   Blood Glucose Calibration (TRUE METRIX LEVEL 1) Low SOLN Use as directed with glucose meter   carvedilol (COREG) 3.125 MG tablet TAKE 1 TABLET TWICE DAILY WITH MEALS   dapagliflozin propanediol (FARXIGA) 10 mg, Oral, Daily   ferrous sulfate 325 mg, Oral, 2 times daily with  meals   furosemide (LASIX) 40 MG tablet Take 1 tablet (40 mg) by mouth every other day   glucose blood (TRUE METRIX BLOOD GLUCOSE TEST) test strip CHECK BLOOD SUGAR EVERY DAY   JANUMET XR [phone] MG TB24 TAKE 1 TABLET EVERY DAY   levothyroxine (SYNTHROID) 50 mcg, Oral, Daily   Multiple Vitamin (MULTIVITAMIN) tablet 1 tablet, Oral, Daily   pantoprazole (PROTONIX) 40 mg, Oral, Daily   sacubitril-valsartan (ENTRESTO) 24-26 MG 0.5 tablets, Oral, 2 times daily   senna (SENOKOT) 8.6 MG tablet 1 tablet, Oral, Daily PRN   TRUEplus Lancets 33G MISC CHECK BLOOD SUGAR ONE TIME DAILY    Social History:  The patient  reports that she has never smoked. She quit smokeless tobacco use about 53 years ago.  Her smokeless tobacco use included snuff. She reports that she does not drink alcohol and does not use drugs.   Family History:  The patient's family history includes Diabetes in her brother, sister, sister, sister, sister, and sister; Heart attack in her brother, mother, sister, and sister; Heart disease in her sister and sister; Prostate cancer in her father; Stroke in her brother and sister; Ulcers in her father.  ROS:  Please see the history of present illness. All other systems are reviewed and otherwise negative.   PHYSICAL EXAM:  VS:  BP 120/64 (BP Location: Left Arm, Patient Position: Sitting, Cuff Size: Normal)   Pulse 73   Ht 5\' 1"  (1.549 m)   Wt 111 lb (50.3 kg)   LMP  (LMP Unknown)   SpO2 98%   BMI 20.97 kg/m  BMI: Body mass index is 20.97 kg/m.  GEN- The patient is well appearing, alert and oriented x 3 today.   Lungs- Clear to ausculation bilaterally, normal work of breathing.  Heart- Regular rate and rhythm, no murmurs, rubs or gallops Extremities- No peripheral edema, warm, dry Skin-   device pocket well-healed, no tethering   Device interrogation done today and reviewed by myself:  Battery 6+ years Lead thresholds, impedence, sensing stable  LV threshold elevated at 2.52mV at  1sec. During threshold testing, patient stated she was experiencing similar "leaping" sensation in her chest  Had diaphragmatic stim at 5mv and 6mv at 1sec Performed vector express with only LV4 - RVcoil at < 3mV at 1sec Patient was already programmed to LV4 - RVcoil Did not adjust vectors No episodes No changes made today  EKG is ordered. Personal review of EKG from today shows:  VP at 73bpm; narrow QRS  Recent Labs: 06/27/2022: TSH 2.520 11/25/2022: ALT 10; BUN 9; Creatinine, Ser 0.94; Potassium 3.8; Sodium 143 12/01/2022: Hemoglobin 12.0; Platelets 94  No results found for requested labs within last 365 days.   CrCl cannot be calculated (Patient's most recent lab result is older than the maximum 21 days allowed.).   Wt Readings from Last 3 Encounters:  03/14/23 111 lb (50.3 kg)  12/30/22 114 lb 6.4 oz (51.9 kg)  12/20/22 116 lb (52.6 kg)     Additional studies reviewed include: Previous EP, cardiology notes.   Nuc med Lexiscan, 02/20/2020 ([facility], [facility]) Indeterminate Lexiscan infusion EKG  Normal myocardial perfusion without evidence of myocardial ischemia   TTE, 02/20/2020 ([facility]) NORMAL LEFT VENTRICULAR SYSTOLIC FUNCTION  NORMAL RIGHT VENTRICULAR SYSTOLIC FUNCTION  MILD VALVULAR REGURGITATION (See above)  NO VALVULAR STENOSIS  TRIVIAL PERICARDIAL EFFUSION  MILD MR, TR  TRIVIAL AR  EF 50%    ASSESSMENT AND PLAN:  #) HFimpEF EF previously as low as less than 15%, has improved to 50% by most recent echo NYHA II-III symptoms Euvolemic on exam with good exercise tolerance General cardiology provider is working up recent chest pressure with SOB symptoms GDMT: Coreg, Farxiga, Entresto 24-26 Diuretic:, Lasix 40 mg every day   #) LBBB #) NICM #) Status post Medtronic CRT-D Device functioning well, see Paceart for details  VP 98% Has diaphragmatic stim at higher output and at alternative vectors.  Patient states that "leaping" sensation is concerning, but not  overly bothersome at this time. Will cont to monitor     Current medicines are reviewed at length with the patient today.   The patient does not have concerns regarding her medicines.  The following changes were made today:  none  Labs/ tests ordered today include:  Orders Placed This Encounter  Procedures   EKG 12-Lead     Disposition: Follow up with Dr. Graciela Husbands in  9  months    Signed, Sherie Don, NP  03/14/23  1:53 PM  Electrophysiology [facility]

## 2023-03-14 ENCOUNTER — Ambulatory Visit: Payer: Medicare HMO | Attending: Cardiology | Admitting: Cardiology

## 2023-03-14 ENCOUNTER — Encounter: Payer: Self-pay | Admitting: Cardiology

## 2023-03-14 VITALS — BP 120/64 | HR 73 | Ht 61.0 in | Wt 111.0 lb

## 2023-03-14 DIAGNOSIS — Z95810 Presence of automatic (implantable) cardiac defibrillator: Secondary | ICD-10-CM

## 2023-03-14 DIAGNOSIS — I5032 Chronic diastolic (congestive) heart failure: Secondary | ICD-10-CM

## 2023-03-14 DIAGNOSIS — I447 Left bundle-branch block, unspecified: Secondary | ICD-10-CM

## 2023-03-14 LAB — CUP PACEART INCLINIC DEVICE CHECK
Date Time Interrogation Session: 20240507140731
Implantable Lead Connection Status: 753985
Implantable Lead Connection Status: 753985
Implantable Lead Connection Status: 753985
Implantable Lead Implant Date: 20160409
Implantable Lead Implant Date: 20160409
Implantable Lead Implant Date: 20160409
Implantable Lead Location: 753858
Implantable Lead Location: 753859
Implantable Lead Location: 753860
Implantable Lead Model: 4598
Implantable Lead Model: 5076
Implantable Pulse Generator Implant Date: 20230523

## 2023-03-14 NOTE — Patient Instructions (Signed)
Medication Instructions:  Your physician recommends that you continue on your current medications as directed. Please refer to the Current Medication list given to you today.  *If you need a refill on your cardiac medications before your next appointment, please call your pharmacy*   Lab Work: No labs ordered  If you have labs (blood work) drawn today and your tests are completely normal, you will receive your results only by: MyChart Message (if you have MyChart) OR A paper copy in the mail If you have any lab test that is abnormal or we need to change your treatment, we will call you to review the results.   Testing/Procedures: No testing ordered  Follow-Up: At [facility], you and your health needs are our priority.  As part of our continuing mission to provide you with exceptional heart care, we have created designated Provider Care Teams.  These Care Teams include your primary Cardiologist (physician) and Advanced Practice Providers (APPs -  Physician Assistants and Nurse Practitioners) who all work together to provide you with the care you need, when you need it.  We recommend signing up for the patient portal called "MyChart".  Sign up information is provided on this After Visit Summary.  MyChart is used to connect with patients for Virtual Visits (Telemedicine).  Patients are able to view lab/test results, encounter notes, upcoming appointments, etc.  Non-urgent messages can be sent to your provider as well.   To learn more about what you can do with MyChart, go to [email or web address].    Your next appointment:   1 year(s)  Provider:   Steven Klein, MD or Suzann Riddle, NP  

## 2023-03-17 ENCOUNTER — Ambulatory Visit (HOSPITAL_COMMUNITY)
Admission: RE | Admit: 2023-03-17 | Discharge: 2023-03-17 | Disposition: A | Discharge status: Home / Self Care | Payer: Medicare HMO | Source: Ambulatory Visit | Attending: Orthopedic Surgery | Admitting: Orthopedic Surgery

## 2023-03-17 DIAGNOSIS — M25511 Pain in right shoulder: Secondary | ICD-10-CM | POA: Diagnosis not present

## 2023-03-17 DIAGNOSIS — M7541 Impingement syndrome of right shoulder: Secondary | ICD-10-CM | POA: Diagnosis not present

## 2023-03-17 NOTE — Progress Notes (Signed)
Per order, Changed device settings to DOO 100 bpm in MRI safe mode.   Tachy therapies off if applicable.   Will program device back to regular settings after MRI scan complete and send post transmission.

## 2023-03-20 DIAGNOSIS — I2089 Other forms of angina pectoris: Secondary | ICD-10-CM | POA: Diagnosis not present

## 2023-03-20 DIAGNOSIS — I5022 Chronic systolic (congestive) heart failure: Secondary | ICD-10-CM | POA: Diagnosis not present

## 2023-03-20 DIAGNOSIS — R0602 Shortness of breath: Secondary | ICD-10-CM | POA: Diagnosis not present

## 2023-03-27 DIAGNOSIS — M7541 Impingement syndrome of right shoulder: Secondary | ICD-10-CM | POA: Diagnosis not present

## 2023-03-28 ENCOUNTER — Ambulatory Visit (INDEPENDENT_AMBULATORY_CARE_PROVIDER_SITE_OTHER): Payer: Medicare HMO

## 2023-03-28 DIAGNOSIS — I5022 Chronic systolic (congestive) heart failure: Secondary | ICD-10-CM

## 2023-03-28 DIAGNOSIS — I428 Other cardiomyopathies: Secondary | ICD-10-CM | POA: Diagnosis not present

## 2023-03-28 LAB — CUP PACEART REMOTE DEVICE CHECK
Battery Remaining Longevity: 75 mo
Battery Voltage: 2.99 V
Brady Statistic AP VP Percent: 0.08 %
Brady Statistic AP VS Percent: 0.01 %
Brady Statistic AS VP Percent: 98.67 %
Brady Statistic AS VS Percent: 1.23 %
Brady Statistic RA Percent Paced: 0.09 %
Brady Statistic RV Percent Paced: 3.19 %
Date Time Interrogation Session: 20240521072405
HighPow Impedance: 51 Ohm
Implantable Lead Connection Status: 753985
Implantable Lead Connection Status: 753985
Implantable Lead Connection Status: 753985
Implantable Lead Implant Date: 20160409
Implantable Lead Implant Date: 20160409
Implantable Lead Implant Date: 20160409
Implantable Lead Location: 753858
Implantable Lead Location: 753859
Implantable Lead Location: 753860
Implantable Lead Model: 4598
Implantable Lead Model: 5076
Implantable Pulse Generator Implant Date: 20230523
Lead Channel Impedance Value: 1007 Ohm
Lead Channel Impedance Value: 1064 Ohm
Lead Channel Impedance Value: 1197 Ohm
Lead Channel Impedance Value: 1235 Ohm
Lead Channel Impedance Value: 1254 Ohm
Lead Channel Impedance Value: 266.667
Lead Channel Impedance Value: 273.729
Lead Channel Impedance Value: 295.076
Lead Channel Impedance Value: 341.479
Lead Channel Impedance Value: 353.147
Lead Channel Impedance Value: 361 Ohm
Lead Channel Impedance Value: 361 Ohm
Lead Channel Impedance Value: 475 Ohm
Lead Channel Impedance Value: 475 Ohm
Lead Channel Impedance Value: 608 Ohm
Lead Channel Impedance Value: 646 Ohm
Lead Channel Impedance Value: 779 Ohm
Lead Channel Impedance Value: 988 Ohm
Lead Channel Pacing Threshold Amplitude: 0.375 V
Lead Channel Pacing Threshold Amplitude: 0.625 V
Lead Channel Pacing Threshold Amplitude: 2 V
Lead Channel Pacing Threshold Pulse Width: 0.4 ms
Lead Channel Pacing Threshold Pulse Width: 0.4 ms
Lead Channel Pacing Threshold Pulse Width: 1 ms
Lead Channel Sensing Intrinsic Amplitude: 0.375 mV
Lead Channel Sensing Intrinsic Amplitude: 0.375 mV
Lead Channel Sensing Intrinsic Amplitude: 8.75 mV
Lead Channel Sensing Intrinsic Amplitude: 8.75 mV
Lead Channel Setting Pacing Amplitude: 1.5 V
Lead Channel Setting Pacing Amplitude: 2 V
Lead Channel Setting Pacing Amplitude: 2.5 V
Lead Channel Setting Pacing Pulse Width: 0.4 ms
Lead Channel Setting Pacing Pulse Width: 1 ms
Lead Channel Setting Sensing Sensitivity: 0.3 mV
Zone Setting Status: 755011

## 2023-04-11 ENCOUNTER — Ambulatory Visit
Admission: RE | Admit: 2023-04-11 | Discharge: 2023-04-11 | Disposition: A | Discharge status: Home / Self Care | Payer: Medicare HMO | Source: Ambulatory Visit | Attending: Family Medicine | Admitting: Family Medicine

## 2023-04-11 ENCOUNTER — Other Ambulatory Visit: Payer: Self-pay | Admitting: Family Medicine

## 2023-04-11 DIAGNOSIS — M8589 Other specified disorders of bone density and structure, multiple sites: Secondary | ICD-10-CM | POA: Diagnosis not present

## 2023-04-11 DIAGNOSIS — E2839 Other primary ovarian failure: Secondary | ICD-10-CM | POA: Diagnosis not present

## 2023-04-11 DIAGNOSIS — R5383 Other fatigue: Secondary | ICD-10-CM

## 2023-04-11 DIAGNOSIS — I5022 Chronic systolic (congestive) heart failure: Secondary | ICD-10-CM

## 2023-04-11 DIAGNOSIS — Z780 Asymptomatic menopausal state: Secondary | ICD-10-CM | POA: Diagnosis not present

## 2023-04-11 NOTE — Telephone Encounter (Signed)
Patient will need to schedule an office visit for further refills. Requested Prescriptions  Pending Prescriptions Disp Refills   carvedilol (COREG) 3.125 MG tablet [Pharmacy Med Name: CARVEDILOL 3.125 MG Tablet] 180 tablet 0    Sig: TAKE 1 TABLET TWICE DAILY WITH MEALS     Cardiovascular: Beta Blockers 3 Passed - 04/11/2023  3:40 PM      Passed - Cr in normal range and within 360 days    Creatinine  Date Value Ref Range Status  02/04/2015 0.70 mg/dL Final    Comment:    1.61-0.96 NOTE: New Reference Range  01/13/15    Creatinine, Ser  Date Value Ref Range Status  11/25/2022 0.94 0.57 - 1.00 mg/dL Final   Creatinine, POC  Date Value Ref Range Status  12/04/2018 n/a mg/dL Final         Passed - AST in normal range and within 360 days    AST  Date Value Ref Range Status  11/25/2022 19 0 - 40 IU/L Final   SGOT(AST)  Date Value Ref Range Status  04/02/2013 17 15 - 37 Unit/L Final         Passed - ALT in normal range and within 360 days    ALT  Date Value Ref Range Status  11/25/2022 10 0 - 32 IU/L Final   SGPT (ALT)  Date Value Ref Range Status  04/02/2013 16 12 - 78 U/L Final         Passed - Last BP in normal range    BP Readings from Last 1 Encounters:  03/14/23 120/64         Passed - Last Heart Rate in normal range    Pulse Readings from Last 1 Encounters:  03/14/23 73         Passed - Valid encounter within last 6 months    Recent Outpatient Visits           4 months ago Weakness   [facility] Alfredia Ferguson, PA-C   9 months ago Lower abdominal pain   [place] [facility] Malva Limes, MD   10 months ago Frequency of urination   [facility] [place], [place], PA-C   1 year ago Adult hypothyroidism   [place] [facility] Malva Limes, MD   1 year ago Hospital discharge follow-up   [facility] Jacky Kindle, [place]

## 2023-04-12 ENCOUNTER — Telehealth: Payer: Self-pay | Admitting: Family Medicine

## 2023-04-12 ENCOUNTER — Other Ambulatory Visit: Payer: Self-pay | Admitting: Family Medicine

## 2023-04-12 DIAGNOSIS — R5383 Other fatigue: Secondary | ICD-10-CM

## 2023-04-12 DIAGNOSIS — I5022 Chronic systolic (congestive) heart failure: Secondary | ICD-10-CM

## 2023-04-12 MED ORDER — CARVEDILOL 3.125 MG PO TABS
3.1250 mg | ORAL_TABLET | Freq: Two times a day (BID) | ORAL | 0 refills | Status: DC
Start: 2023-04-12 — End: 2023-06-27

## 2023-04-12 NOTE — Telephone Encounter (Signed)
Medication Refill - Medication:  carvedilol (COREG) 3.125 MG tablet   Has the patient contacted their pharmacy? Yes.   Pt states that her pharmacy sent over a request for refill. Pt states that she is completely out of medication and is wanting to see if a short supply could be sent to [facility] until her medication comes in the mail.      Preferred Pharmacy (with phone number or street name): [id number] - [place], [place] - [address] AT [facility] OF [address] [facility]  Phone: [phone] Fax: [phone]  Has the patient been seen for an appointment in the last year OR does the patient have an upcoming appointment? Yes.    Agent: Please be advised that RX refills may take up to 3 business days. We ask that you follow-up with your pharmacy.

## 2023-04-12 NOTE — Telephone Encounter (Signed)
14 tablets given to last until her mail order arrives.   She is out of medication.  Requested Prescriptions  Pending Prescriptions Disp Refills   carvedilol (COREG) 3.125 MG tablet 14 tablet 0    Sig: Take 1 tablet (3.125 mg total) by mouth 2 (two) times daily with a meal.     Cardiovascular: Beta Blockers 3 Passed - 04/12/2023 11:19 AM      Passed - Cr in normal range and within 360 days    Creatinine  Date Value Ref Range Status  02/04/2015 0.70 mg/dL Final    Comment:    1.61-0.96 NOTE: New Reference Range  01/13/15    Creatinine, Ser  Date Value Ref Range Status  11/25/2022 0.94 0.57 - 1.00 mg/dL Final   Creatinine, POC  Date Value Ref Range Status  12/04/2018 n/a mg/dL Final         Passed - AST in normal range and within 360 days    AST  Date Value Ref Range Status  11/25/2022 19 0 - 40 IU/L Final   SGOT(AST)  Date Value Ref Range Status  04/02/2013 17 15 - 37 Unit/L Final         Passed - ALT in normal range and within 360 days    ALT  Date Value Ref Range Status  11/25/2022 10 0 - 32 IU/L Final   SGPT (ALT)  Date Value Ref Range Status  04/02/2013 16 12 - 78 U/L Final         Passed - Last BP in normal range    BP Readings from Last 1 Encounters:  03/14/23 120/64         Passed - Last Heart Rate in normal range    Pulse Readings from Last 1 Encounters:  03/14/23 73         Passed - Valid encounter within last 6 months    Recent Outpatient Visits           4 months ago Weakness   [facility] Alfredia Ferguson, PA-C   9 months ago Lower abdominal pain   [place] [facility] Malva Limes, MD   10 months ago Frequency of urination   [facility] [place] [place], PA-C   1 year ago Adult hypothyroidism   [facility] Malva Limes, MD   1 year ago Hospital discharge follow-up   [facility] Jacky Kindle, [place]

## 2023-04-13 ENCOUNTER — Telehealth: Payer: Self-pay

## 2023-04-13 NOTE — Progress Notes (Unsigned)
Care Management & Coordination Services Pharmacy Team Pharmacy Assistant   Name: UMA JERDE  MRN: [id number] DOB: 09-20-1946  Reason for Encounter: Diabetes  Contacted patient to discuss diabetes disease state. Unsuccessful outreach. Left voicemail for patient to return call. 04/13/23 @ 1340  Called patient but per patient she is driving at the moment and requested that I contact her back this afternoon. 06/10 @1133   Chart Updates:  Recent office visits:  None ID  Recent consult visits:  03/14/2023 Sherie Don, NP (Cardiology) for Follow-up- Stopped: Cyanocobalamin 100 mg due to patient not taking, Patient to follow-up in 1 year  03/07/2023 Minda Ditto, PA (Cardiology) for 6 month Follow-up- No medication changes noted, Orders Placed This Encounter Procedures, NM myocardial perfusion SPECT multiple (stress and rest), ECG 12-lead, ECG stress test only Echo complete, No follow-up noted  03/02/2023 Rheumatology for Initial consult- (I am  unable to view this note)  Hospital visits:  None in previous 6 months  Medications: Outpatient Encounter Medications as of 04/13/2023  Medication Sig   acetaminophen (TYLENOL) 500 MG tablet Take 500 mg by mouth at bedtime as needed.   Alcohol Swabs (DROPSAFE ALCOHOL PREP) 70 % PADS USE ONCE DAILY   aspirin EC 81 MG tablet Take 81 mg by mouth daily.   atorvastatin (LIPITOR) 20 MG tablet TAKE 1 TABLET EVERY DAY   Blood Glucose Calibration (TRUE METRIX LEVEL 1) Low SOLN Use as directed with glucose meter   carvedilol (COREG) 3.125 MG tablet Take 1 tablet (3.125 mg total) by mouth 2 (two) times daily with a meal.   dapagliflozin propanediol (FARXIGA) 10 MG TABS tablet Take 1 tablet (10 mg total) by mouth daily.   ferrous sulfate 325 (65 FE) MG tablet Take 325 mg by mouth 2 (two) times daily with a meal.   furosemide (LASIX) 40 MG tablet Take 1 tablet (40 mg) by mouth every other day   glucose blood (TRUE METRIX BLOOD GLUCOSE TEST) test strip  CHECK BLOOD SUGAR EVERY DAY   JANUMET XR [phone] MG TB24 TAKE 1 TABLET EVERY DAY   levothyroxine (SYNTHROID) 50 MCG tablet Take 1 tablet (50 mcg total) by mouth daily.   Multiple Vitamin (MULTIVITAMIN) tablet Take 1 tablet by mouth daily.   pantoprazole (PROTONIX) 40 MG tablet TAKE 1 TABLET EVERY DAY   sacubitril-valsartan (ENTRESTO) 24-26 MG Take 0.5 tablets by mouth 2 (two) times daily.   senna (SENOKOT) 8.6 MG tablet Take 1 tablet by mouth daily as needed.   TRUEplus Lancets 33G MISC CHECK BLOOD SUGAR ONE TIME DAILY   No facility-administered encounter medications on file as of 04/13/2023.    Recent Relevant Labs: Lab Results  Component Value Date/Time   HGBA1C 7.2 (H) 11/25/2022 02:26 PM   HGBA1C 6.9 (H) 05/26/2022 10:19 AM   HGBA1C 6.3 02/20/2014 07:57 PM   MICROALBUR negative 06/02/2020 10:36 AM   MICROALBUR negative 12/04/2018 11:50 AM    Kidney Function Lab Results  Component Value Date/Time   CREATININE 0.94 11/25/2022 02:26 PM   CREATININE 0.83 09/02/2022 08:29 AM   CREATININE 0.70 02/04/2015 02:26 PM   CREATININE 0.89 11/10/2014 11:47 AM   GFR 88.96 02/05/2015 10:34 AM   GFRNONAA >60 09/02/2022 08:29 AM   GFRNONAA >60 02/04/2015 02:26 PM   GFRAA 79 07/01/2020 11:26 AM   GFRAA >60 02/04/2015 02:26 PM  Current antihyperglycemic regimen:  Farxiga 5 mg daily Janumet [phone] mg daily    Patient verbally confirms she is taking the above medications as directed. {yes/no:20286}  What diet changes have been made to improve diabetes control?  What recent interventions/DTPs have been made to improve glycemic control:  Patient's Marcelline Deist was increased to 10 mg daily  Have there been any recent hospitalizations or ED visits since last visit with PharmD? No  Patient {reports/denies:24182} hypoglycemic symptoms, including {Hypoglycemic Symptoms:3049003}  Patient {reports/denies:24182} hyperglycemic symptoms, including {symptoms; hyperglycemia:17903}  How often are you  checking your blood sugar? {BG Testing frequency:23922}  What are your blood sugars ranging?  Fasting: *** Before meals: *** After meals: *** Bedtime: ***  During the week, how often does your blood glucose drop below 70? {LowBGfrequency:24142}  Are you checking your feet daily/regularly? {yes/no:20286}  Adherence Review: Is the patient currently on a STATIN medication? {yes/no:20286} Is the patient currently on ACE/ARB medication? {yes/no:20286} Does the patient have >5 day gap between last estimated fill dates? {yes/no:20286}   Star Rating Drugs:  Atorvastatin 20 mg last filled on 03/30/2023 for a 90-DS with [facility] Farxiga 10 mg patient receives this medication via [place] Patient Assistance Program Janumet [phone] mg last filled on 03/04/2023 for a 90-DS with [facility]   Care Gaps: Annual wellness visit in last year? Yes Last eye exam / retinopathy screening: 08/24/2022 Last diabetic foot exam: 05/06/2022 Diabetic Kidney Evaluation  Patient has follow-up with CPP on 05/08/2023 @ 1400.  Adelene Idler, CPA/CMA Clinical Pharmacist Assistant Phone: [phone]

## 2023-04-20 NOTE — Progress Notes (Signed)
Remote ICD transmission.   

## 2023-04-28 DIAGNOSIS — Z95810 Presence of automatic (implantable) cardiac defibrillator: Secondary | ICD-10-CM | POA: Diagnosis not present

## 2023-04-28 DIAGNOSIS — I5022 Chronic systolic (congestive) heart failure: Secondary | ICD-10-CM | POA: Diagnosis not present

## 2023-04-28 DIAGNOSIS — I447 Left bundle-branch block, unspecified: Secondary | ICD-10-CM | POA: Diagnosis not present

## 2023-04-28 DIAGNOSIS — I10 Essential (primary) hypertension: Secondary | ICD-10-CM | POA: Diagnosis not present

## 2023-04-28 DIAGNOSIS — E782 Mixed hyperlipidemia: Secondary | ICD-10-CM | POA: Diagnosis not present

## 2023-05-03 ENCOUNTER — Encounter: Payer: Self-pay | Admitting: Pharmacist

## 2023-05-03 NOTE — Progress Notes (Signed)
Patient previously followed by [facility] pharmacist. Per clinical review, no pharmacist appointment needed at this time. Will notify pharmacy patient advocate team of Farxiga medication assistance so that patient can be re-enrolled later this year. Care guide directed to contact patient and cancel appointment and notify pharmacy team of any patient concerns.

## 2023-05-27 IMAGING — MG MM DIGITAL SCREENING BILAT W/ TOMO AND CAD
6 of 10 series · 6 of 30 positions shown · non-contrast
Comparison: Previous exam(s).

CLINICAL DATA: Screening.

EXAM:
DIGITAL SCREENING BILATERAL MAMMOGRAM WITH TOMOSYNTHESIS AND CAD
TECHNIQUE: Bilateral screening digital craniocaudal and mediolateral oblique
mammograms were obtained. Bilateral screening digital breast
tomosynthesis was performed. The images were evaluated with
computer-aided detection.

[L CC synth-2D]
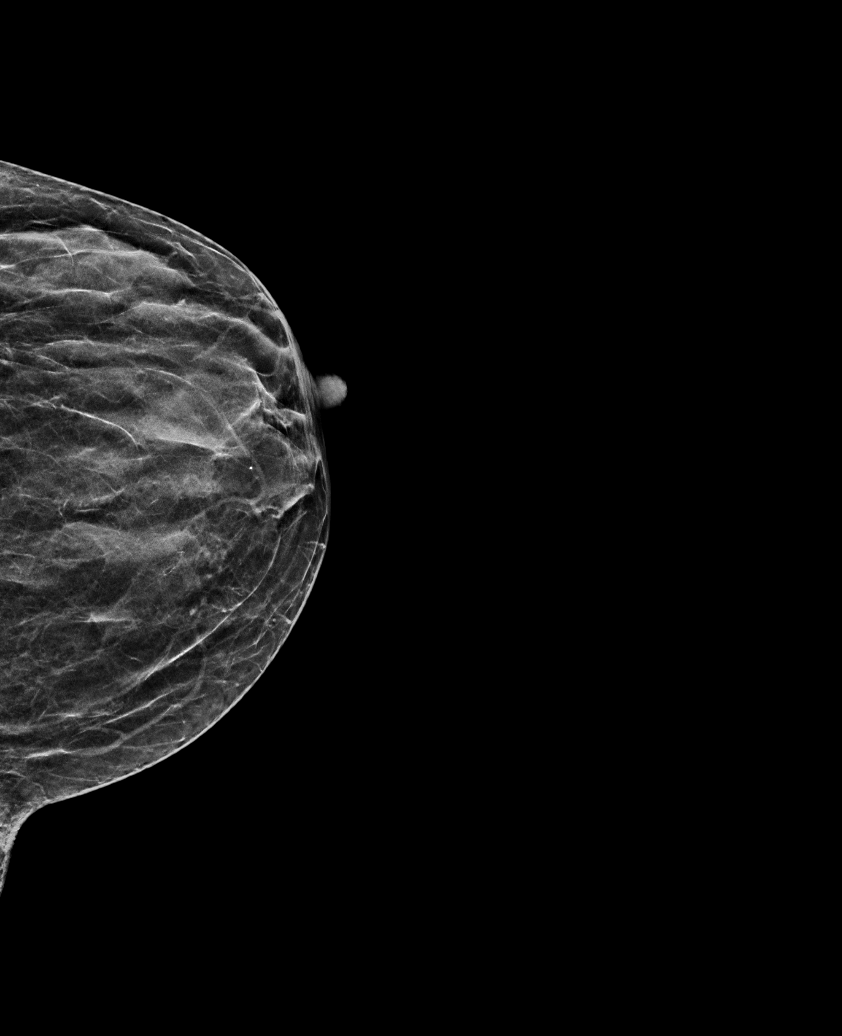

[L MLO synth-2D (1 of 2)]
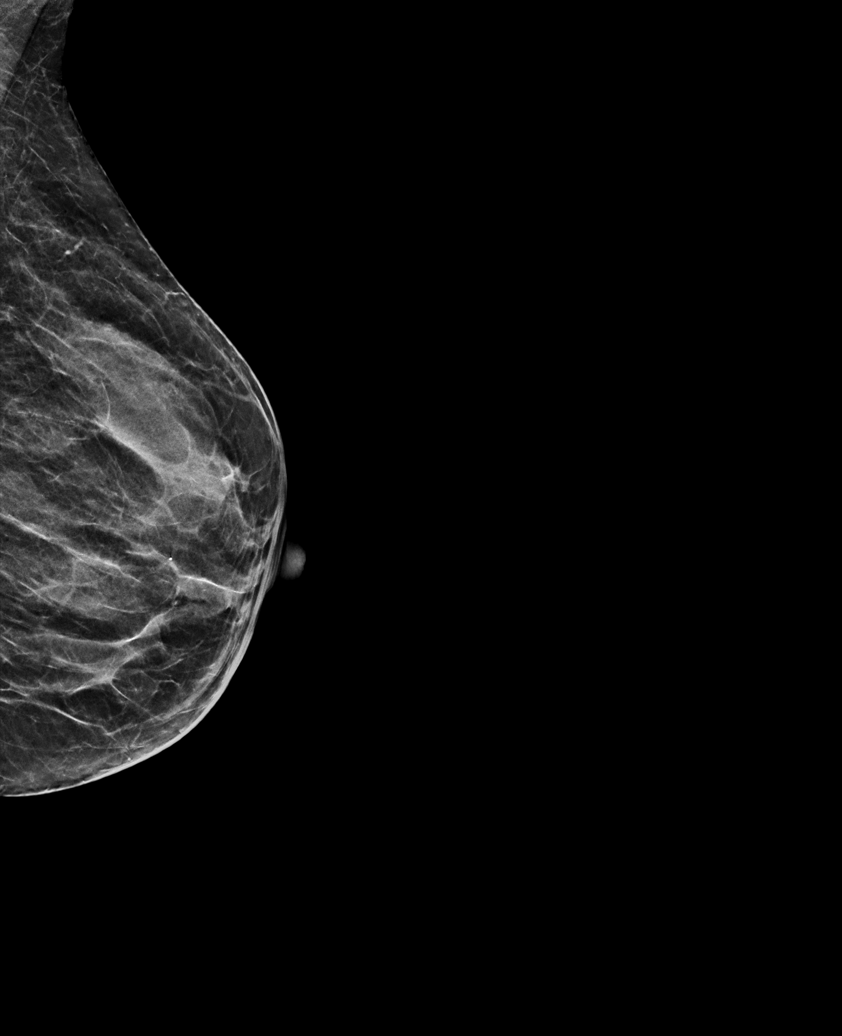

[R MLO synth-2D]
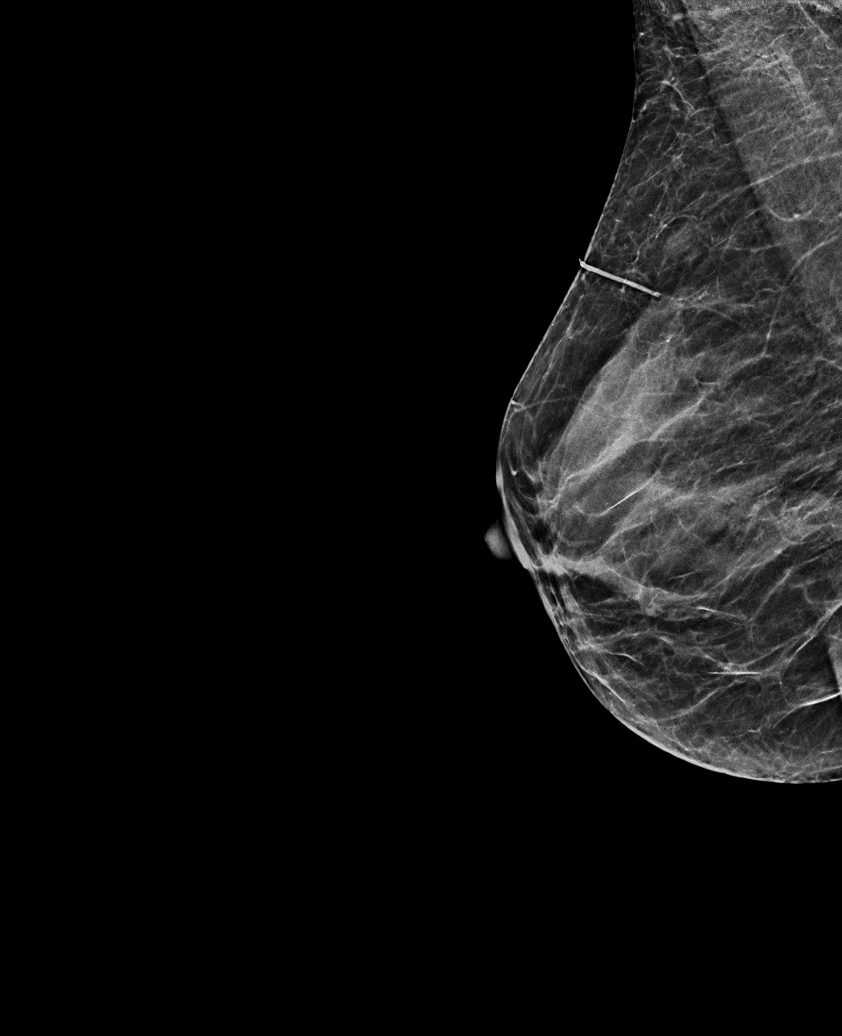

[R CC synth-2D]
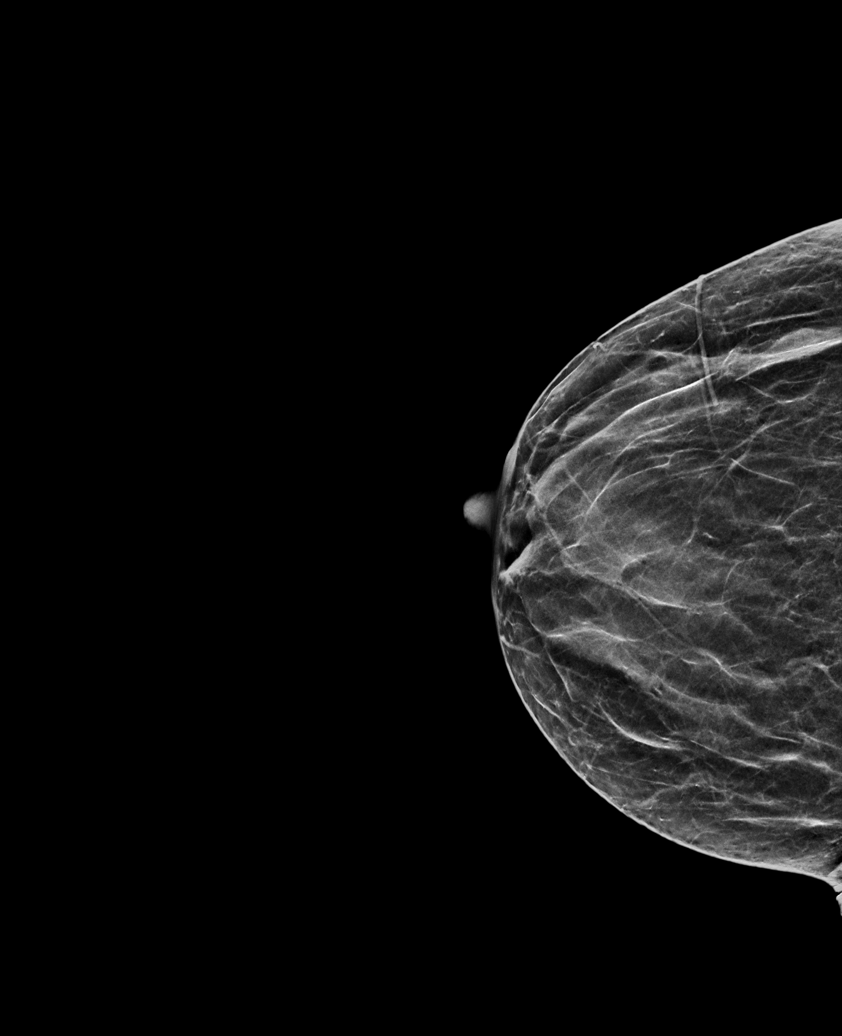

[L MLO synth-2D (2 of 2)]
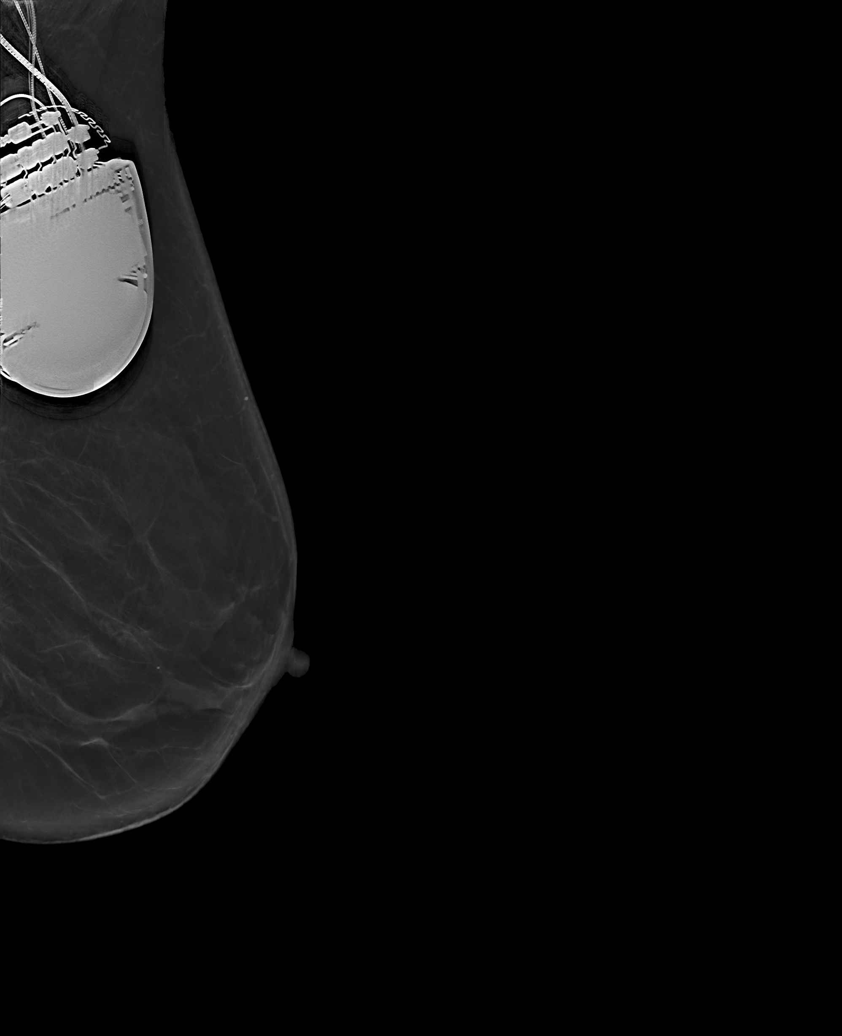

[R CC tomo · tomo slice 23/45.0]
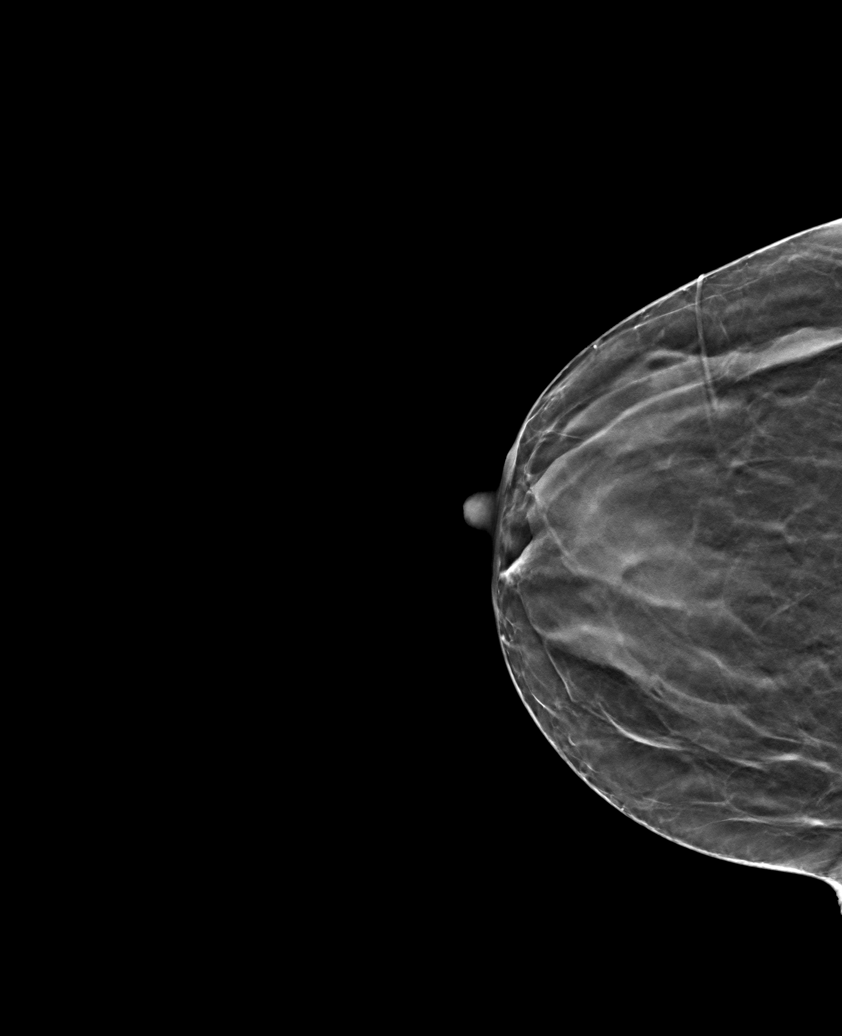

[6 of 30 positions shown; findings below may reference images not displayed]

ACR Breast Density Category c: The breast tissue is heterogeneously
dense, which may obscure small masses.
FINDINGS: There are no findings suspicious for malignancy.
IMPRESSION: No mammographic evidence of malignancy. A result letter of this
screening mammogram will be mailed directly to the patient.

RECOMMENDATION:
Screening mammogram in one year. (Code:Q3-W-BC3)

BI-RADS CATEGORY  1: Negative.

## 2023-05-28 ENCOUNTER — Other Ambulatory Visit: Payer: Self-pay | Admitting: Family Medicine

## 2023-05-28 DIAGNOSIS — E039 Hypothyroidism, unspecified: Secondary | ICD-10-CM

## 2023-06-05 DIAGNOSIS — M6530 Trigger finger, unspecified finger: Secondary | ICD-10-CM | POA: Diagnosis not present

## 2023-06-05 DIAGNOSIS — D696 Thrombocytopenia, unspecified: Secondary | ICD-10-CM | POA: Diagnosis not present

## 2023-06-05 DIAGNOSIS — R768 Other specified abnormal immunological findings in serum: Secondary | ICD-10-CM | POA: Diagnosis not present

## 2023-06-24 ENCOUNTER — Other Ambulatory Visit: Payer: Self-pay | Admitting: Family Medicine

## 2023-06-24 DIAGNOSIS — I5022 Chronic systolic (congestive) heart failure: Secondary | ICD-10-CM

## 2023-06-24 DIAGNOSIS — R5383 Other fatigue: Secondary | ICD-10-CM

## 2023-06-27 ENCOUNTER — Ambulatory Visit (INDEPENDENT_AMBULATORY_CARE_PROVIDER_SITE_OTHER): Payer: Medicare HMO

## 2023-06-27 DIAGNOSIS — I5022 Chronic systolic (congestive) heart failure: Secondary | ICD-10-CM

## 2023-06-27 DIAGNOSIS — I428 Other cardiomyopathies: Secondary | ICD-10-CM | POA: Diagnosis not present

## 2023-06-27 LAB — CUP PACEART REMOTE DEVICE CHECK
Battery Remaining Longevity: 72 mo
Battery Voltage: 2.99 V
Brady Statistic AP VP Percent: 0.04 %
Brady Statistic AP VS Percent: 0.01 %
Brady Statistic AS VP Percent: 98.72 %
Brady Statistic AS VS Percent: 1.23 %
Brady Statistic RA Percent Paced: 0.05 %
Brady Statistic RV Percent Paced: 3.82 %
Date Time Interrogation Session: 20240820052825
HighPow Impedance: 52 Ohm
Implantable Lead Connection Status: 753985
Implantable Lead Connection Status: 753985
Implantable Lead Connection Status: 753985
Implantable Lead Implant Date: 20160409
Implantable Lead Implant Date: 20160409
Implantable Lead Implant Date: 20160409
Implantable Lead Location: 753858
Implantable Lead Location: 753859
Implantable Lead Location: 753860
Implantable Lead Model: 4598
Implantable Lead Model: 5076
Implantable Pulse Generator Implant Date: 20230523
Lead Channel Impedance Value: 1026 Ohm
Lead Channel Impedance Value: 1140 Ohm
Lead Channel Impedance Value: 1178 Ohm
Lead Channel Impedance Value: 1235 Ohm
Lead Channel Impedance Value: 262.946
Lead Channel Impedance Value: 273.729
Lead Channel Impedance Value: 292.308
Lead Channel Impedance Value: 331.831
Lead Channel Impedance Value: 342 Ohm
Lead Channel Impedance Value: 349.189
Lead Channel Impedance Value: 361 Ohm
Lead Channel Impedance Value: 456 Ohm
Lead Channel Impedance Value: 475 Ohm
Lead Channel Impedance Value: 589 Ohm
Lead Channel Impedance Value: 646 Ohm
Lead Channel Impedance Value: 760 Ohm
Lead Channel Impedance Value: 988 Ohm
Lead Channel Impedance Value: 988 Ohm
Lead Channel Pacing Threshold Amplitude: 0.375 V
Lead Channel Pacing Threshold Amplitude: 0.75 V
Lead Channel Pacing Threshold Amplitude: 1.75 V
Lead Channel Pacing Threshold Pulse Width: 0.4 ms
Lead Channel Pacing Threshold Pulse Width: 0.4 ms
Lead Channel Pacing Threshold Pulse Width: 1 ms
Lead Channel Sensing Intrinsic Amplitude: 0.75 mV
Lead Channel Sensing Intrinsic Amplitude: 0.75 mV
Lead Channel Sensing Intrinsic Amplitude: 8.875 mV
Lead Channel Sensing Intrinsic Amplitude: 8.875 mV
Lead Channel Setting Pacing Amplitude: 1.5 V
Lead Channel Setting Pacing Amplitude: 2 V
Lead Channel Setting Pacing Amplitude: 2.5 V
Lead Channel Setting Pacing Pulse Width: 0.4 ms
Lead Channel Setting Pacing Pulse Width: 1 ms
Lead Channel Setting Sensing Sensitivity: 0.3 mV
Zone Setting Status: 755011

## 2023-06-30 ENCOUNTER — Inpatient Hospital Stay: Payer: Medicare HMO

## 2023-06-30 ENCOUNTER — Inpatient Hospital Stay: Payer: Medicare HMO | Attending: Oncology | Admitting: Oncology

## 2023-06-30 ENCOUNTER — Encounter: Payer: Self-pay | Admitting: Oncology

## 2023-06-30 VITALS — BP 109/70 | HR 79 | Temp 96.9°F | Resp 18 | Wt 110.4 lb

## 2023-06-30 DIAGNOSIS — I509 Heart failure, unspecified: Secondary | ICD-10-CM | POA: Diagnosis not present

## 2023-06-30 DIAGNOSIS — K219 Gastro-esophageal reflux disease without esophagitis: Secondary | ICD-10-CM | POA: Diagnosis not present

## 2023-06-30 DIAGNOSIS — D696 Thrombocytopenia, unspecified: Secondary | ICD-10-CM | POA: Insufficient documentation

## 2023-06-30 DIAGNOSIS — E039 Hypothyroidism, unspecified: Secondary | ICD-10-CM | POA: Insufficient documentation

## 2023-06-30 DIAGNOSIS — Z79899 Other long term (current) drug therapy: Secondary | ICD-10-CM | POA: Diagnosis not present

## 2023-06-30 DIAGNOSIS — Z7982 Long term (current) use of aspirin: Secondary | ICD-10-CM | POA: Diagnosis not present

## 2023-06-30 DIAGNOSIS — E785 Hyperlipidemia, unspecified: Secondary | ICD-10-CM | POA: Insufficient documentation

## 2023-06-30 DIAGNOSIS — E119 Type 2 diabetes mellitus without complications: Secondary | ICD-10-CM | POA: Diagnosis not present

## 2023-06-30 DIAGNOSIS — M2550 Pain in unspecified joint: Secondary | ICD-10-CM | POA: Diagnosis not present

## 2023-06-30 DIAGNOSIS — R768 Other specified abnormal immunological findings in serum: Secondary | ICD-10-CM | POA: Diagnosis not present

## 2023-06-30 DIAGNOSIS — Z79890 Hormone replacement therapy (postmenopausal): Secondary | ICD-10-CM | POA: Diagnosis not present

## 2023-06-30 LAB — CMP (CANCER CENTER ONLY)
ALT: 12 U/L (ref 0–44)
AST: 20 U/L (ref 15–41)
Albumin: 4.1 g/dL (ref 3.5–5.0)
Alkaline Phosphatase: 64 U/L (ref 38–126)
Anion gap: 6 (ref 5–15)
BUN: 11 mg/dL (ref 8–23)
CO2: 26 mmol/L (ref 22–32)
Calcium: 9.3 mg/dL (ref 8.9–10.3)
Chloride: 105 mmol/L (ref 98–111)
Creatinine: 0.79 mg/dL (ref 0.44–1.00)
GFR, Estimated: >60 mL/min
Glucose, Bld: 146 mg/dL — ABNORMAL HIGH (ref 70–99)
Potassium: 3.6 mmol/L (ref 3.5–5.1)
Sodium: 137 mmol/L (ref 135–145)
Total Bilirubin: 0.4 mg/dL (ref 0.3–1.2)
Total Protein: 6.9 g/dL (ref 6.5–8.1)

## 2023-06-30 LAB — CBC WITH DIFFERENTIAL/PLATELET
Abs Immature Granulocytes: 0.01 10*3/uL (ref 0.00–0.07)
Basophils Absolute: 0 10*3/uL (ref 0.0–0.1)
Basophils Relative: 0 %
Eosinophils Absolute: 0 10*3/uL (ref 0.0–0.5)
Eosinophils Relative: 1 %
HCT: 37 % (ref 36.0–46.0)
Hemoglobin: 11.9 g/dL — ABNORMAL LOW (ref 12.0–15.0)
Immature Granulocytes: 0 %
Lymphocytes Relative: 37 %
Lymphs Abs: 1.2 10*3/uL (ref 0.7–4.0)
MCH: 29.8 pg (ref 26.0–34.0)
MCHC: 32.2 g/dL (ref 30.0–36.0)
MCV: 92.5 fL (ref 80.0–100.0)
Monocytes Absolute: 0.4 10*3/uL (ref 0.1–1.0)
Monocytes Relative: 12 %
Neutro Abs: 1.6 10*3/uL — ABNORMAL LOW (ref 1.7–7.7)
Neutrophils Relative %: 50 %
Platelets: 84 10*3/uL — ABNORMAL LOW (ref 150–400)
RBC: 4 MIL/uL (ref 3.87–5.11)
RDW: 12.8 % (ref 11.5–15.5)
WBC: 3.2 10*3/uL — ABNORMAL LOW (ref 4.0–10.5)
nRBC: 0 % (ref 0.0–0.2)

## 2023-06-30 LAB — TECHNOLOGIST SMEAR REVIEW

## 2023-06-30 NOTE — Assessment & Plan Note (Addendum)
Chronic thrombocytopenia, physical examination did not reveal any significant splenomegaly.  Ultrasound abdomen showed normal spleen size. Labs are reviewed and discussed with patient. Adequate B12, folate,normal immature platelet fraction, no M protein on myeloma panel, normal light chain ratio, B cell clonality cannot be evaluated and no other significant immunophenotypic abnormality detected on  flow cytometry, elevated LDH, negative hepatitis panel, negative HIV Suspect decreased marrow production.  I recommend bone marrow biopsy for further evaluation.  She agrees with the plan.

## 2023-06-30 NOTE — Progress Notes (Signed)
Hematology/Oncology Progress note [phone] [phone]        CHIEF COMPLAINTS/REASON FOR VISIT:  Evaluation of thrombocytopenia  ASSESSMENT & PLAN:   Thrombocytopenia (HCC) Chronic thrombocytopenia, physical examination did not reveal any significant splenomegaly.  Ultrasound abdomen showed normal spleen size. Labs are reviewed and discussed with patient. Adequate B12, folate,normal immature platelet fraction, no M protein on myeloma panel, normal light chain ratio, B cell clonality cannot be evaluated and no other significant immunophenotypic abnormality detected on  flow cytometry, elevated LDH, negative hepatitis panel, negative HIV Suspect decreased marrow production.  I recommend bone marrow biopsy for further evaluation.  She agrees with the plan.    Positive ANA (antinuclear antibody) Per rheumatology, she does not have rheumatology disorders.    Orders Placed This Encounter  Procedures   IR BONE MARROW BIOPSY & ASPIRATION    Standing Status:   Future    Standing Expiration Date:   06/29/2024    Order Specific Question:   Reason for Exam (SYMPTOM  OR DIAGNOSIS REQUIRED)    Answer:   Thrombocytopenia    Order Specific Question:   Preferred Imaging Location?    Answer:   [facility]   Miscellaneous [facility] test (send-out)    Standing Status:   Future    Number of Occurrences:   1    Standing Expiration Date:   06/29/2024    Order Specific Question:   Test name / description:    Answer:   B-Cell Gene Rearrangements Profile, IgH and IgK [facility]  [id number]   Follow-up 6 months All questions were answered. The patient knows to call the clinic with any problems, questions or concerns.  Rickard Patience, MD, PhD [facility] Hematology Oncology 06/30/2023    HISTORY OF PRESENTING ILLNESS:  Bethany [name] is a 77 y.o. female who was seen in consultation at the request of Sherrie Mustache, Demetrios Isaacs, MD for evaluation of thrombocytopenia Patient has a long  standing thrombocytopenia history since at least 2014. Her counts fluctuates, ranging from 90s to 120s.  Denies weight loss, fever, chills, fatigue, night sweats.  Denies hematochezia, hematuria, hematemesis, epistaxis, black tarry stool.  Patient has no easy bruising.    Denies history hepatitis or HIV infection Denies history of chronic liver disease Denies routine alcohol consumption. Denies dietary restrictions.   INTERVAL HISTORY Bethany [name] is a 77 y.o. female who has above history reviewed by me today presents for follow up visit for thrombocytopenia She presents to discuss results.  No new complaints.  Occasional joint pain, denies any morning stiffness.    MEDICAL HISTORY:  Past Medical History:  Diagnosis Date   AICD (automatic cardioverter/defibrillator) present    Anemia    Anxiety    CHF (congestive heart failure) (HCC)    Complication of anesthesia    Depression    Diabetes (HCC)    Dilated cardiomyopathy (HCC)    Diverticulosis    Dyspnea    Dysrhythmia    Edema    FEET/ANKLES OCCAS   GERD (gastroesophageal reflux disease)    Hyperlipidemia    Hypothyroidism    LV dysfunction    Orthopnea    USES 3 PILLOWS   PONV (postoperative nausea and vomiting)    Reflux    Thrombocytopenia (HCC)    Thyroid disease    VHD (valvular heart disease)     SURGICAL HISTORY: Past Surgical History:  Procedure Laterality Date   ABDOMINAL HYSTERECTOMY     BI-VENTRICULAR IMPLANTABLE CARDIOVERTER DEFIBRILLATOR [name] 02/11/2015  Procedure: BI-VENTRICULAR IMPLANTABLE CARDIOVERTER DEFIBRILLATOR  (CRT-D);  Surgeon: Duke Salvia, MD;  Location: [facility] CATH LAB;  Service: Cardiovascular;  Laterality: [name];   BONE MARROW BIOPSY  2007   spine   BREAST BIOPSY Right    neg   BREAST EXCISIONAL BIOPSY Right    CARDIAC CATHETERIZATION     CATARACT EXTRACTION W/PHACO Right 01/03/2017   Procedure: CATARACT EXTRACTION PHACO AND INTRAOCULAR LENS PLACEMENT (IOC);  Surgeon: Lockie Mola, MD;  Location: [facility] ORS;  Service: Ophthalmology;  Laterality: Right;  [place] 00:50 AP% 19.5 CDE 9.94 fluid pack lot # [id number]   CATARACT EXTRACTION W/PHACO Left 10/27/2016   Procedure: CATARACT EXTRACTION PHACO AND INTRAOCULAR LENS PLACEMENT (IOC);  Surgeon: Lockie Mola, MD;  Location: [facility] ORS;  Service: Ophthalmology;  Laterality: Left;  [place]  01:12 AP%  21.0 CDE  11.59 Fluid pack lot # [id number]   CHOLECYSTECTOMY     ICD GENERATOR CHANGEOUT [name] 03/29/2022   Procedure: ICD GENERATOR CHANGEOUT;  Surgeon: Duke Salvia, MD;  Location: [facility] INVASIVE CV LAB;  Service: Cardiovascular;  Laterality: [name];   INSERT / REPLACE / REMOVE PACEMAKER     AICD   LEAD REVISION [name] 02/11/2015   Procedure: LEAD REVISION;  Surgeon: Duke Salvia, MD;  Location: [facility] CATH LAB;  Service: Cardiovascular;  Laterality: [name];   PARTIAL HYSTERECTOMY  1977   TOOTH EXTRACTION  09/23/2015    SOCIAL HISTORY: Social History   Socioeconomic History   Marital status: Divorced    Spouse name: Not on file   Number of children: 4   Years of education: Not on file   Highest education level: GED or equivalent  Occupational History   Occupation: retired  Tobacco Use   Smoking status: Never   Smokeless tobacco: Former    Types: Snuff    Quit date: 10/27/1969  Vaping Use   Vaping status: Never Used  Substance and Sexual Activity   Alcohol use: No    Alcohol/week: 0.0 standard drinks of alcohol   Drug use: No   Sexual activity: Not on file  Other Topics Concern   Not on file  Social History Narrative   Not on file   Social Determinants of Health   Financial Resource Strain: Low Risk  (12/20/2022)   Overall Financial Resource Strain (CARDIA)    Difficulty of Paying Living Expenses: Not hard at all  Food Insecurity: No Food Insecurity (05/24/2022)   Hunger Vital Sign    Worried About Running Out of Food in the Last Year: Never true    Ran Out of Food in the Last Year: Never true   Transportation Needs: No Transportation Needs (12/20/2022)   PRAPARE - Administrator, Civil Service (Medical): No    Lack of Transportation (Non-Medical): No  Physical Activity: Insufficiently Active (12/20/2022)   Exercise Vital Sign    Days of Exercise per Week: 3 days    Minutes of Exercise per Session: 30 min  Stress: No Stress Concern Present (12/20/2022)   [employer] of Occupational Health - Occupational Stress Questionnaire    Feeling of Stress : Only a little  Social Connections: Moderately Integrated (12/20/2022)   Social Connection and Isolation Panel [NHANES]    Frequency of Communication with Friends and Family: More than three times a week    Frequency of Social Gatherings with Friends and Family: More than three times a week    Attends Religious Services: More than 4 times per year    Active Member  of Clubs or Organizations: Yes    Attends Banker Meetings: More than 4 times per year    Marital Status: Divorced  Intimate Partner Violence: Not At Risk (12/20/2022)   Humiliation, Afraid, Rape, and Kick questionnaire    Fear of Current or Ex-Partner: No    Emotionally Abused: No    Physically Abused: No    Sexually Abused: No    FAMILY HISTORY: Family History  Problem Relation Age of Onset   Heart attack Mother    Prostate cancer Father    Ulcers Father    Heart disease Sister    Stroke Sister    Heart attack Sister    Diabetes Sister    Heart disease Sister    Diabetes Sister    Heart attack Sister    Diabetes Sister    Diabetes Sister    Diabetes Sister    Diabetes Brother    Heart attack Brother    Stroke Brother    Breast cancer Neg Hx     ALLERGIES:  is allergic to other and sulfa antibiotics.  MEDICATIONS:  Current Outpatient Medications  Medication Sig Dispense Refill   acetaminophen (TYLENOL) 500 MG tablet Take 500 mg by mouth at bedtime as needed.     Alcohol Swabs (DROPSAFE ALCOHOL PREP) 70 % PADS USE ONCE DAILY  100 each 3   aspirin EC 81 MG tablet Take 81 mg by mouth daily.     atorvastatin (LIPITOR) 20 MG tablet TAKE 1 TABLET EVERY DAY 90 tablet 3   Blood Glucose Calibration (TRUE METRIX LEVEL 1) Low SOLN Use as directed with glucose meter 1 each 3   carvedilol (COREG) 3.125 MG tablet TAKE 1 TABLET TWICE DAILY WITH MEALS (NEED MD APPOINTMENT) 180 tablet 3   dapagliflozin propanediol (FARXIGA) 10 MG TABS tablet Take 1 tablet (10 mg total) by mouth daily. 90 tablet 3   ferrous sulfate 325 (65 FE) MG tablet Take 325 mg by mouth 2 (two) times daily with a meal.     furosemide (LASIX) 40 MG tablet Take 1 tablet (40 mg) by mouth every other day 30 tablet    glucose blood (TRUE METRIX BLOOD GLUCOSE TEST) test strip CHECK BLOOD SUGAR EVERY DAY 100 strip 3   JANUMET XR [phone] MG TB24 TAKE 1 TABLET EVERY DAY 90 tablet 4   levothyroxine (SYNTHROID) 50 MCG tablet Take 1 tablet (50 mcg total) by mouth daily before breakfast. Please schedule office visit before any future refills. 90 tablet 0   Multiple Vitamin (MULTIVITAMIN) tablet Take 1 tablet by mouth daily.     pantoprazole (PROTONIX) 40 MG tablet TAKE 1 TABLET EVERY DAY 90 tablet 4   sacubitril-valsartan (ENTRESTO) 24-26 MG Take 0.5 tablets by mouth 2 (two) times daily.     senna (SENOKOT) 8.6 MG tablet Take 1 tablet by mouth daily as needed.     TRUEplus Lancets 33G MISC CHECK BLOOD SUGAR ONE TIME DAILY 100 each 4   No current facility-administered medications for this visit.    Review of Systems  Constitutional:  Negative for appetite change, chills, fatigue and fever.  HENT:   Negative for hearing loss and voice change.   Eyes:  Negative for eye problems.  Respiratory:  Negative for chest tightness and cough.   Cardiovascular:  Negative for chest pain.  Gastrointestinal:  Negative for abdominal distention, abdominal pain and blood in stool.  Endocrine: Negative for hot flashes.  Genitourinary:  Negative for difficulty urinating and frequency.  Musculoskeletal:  Negative for arthralgias.  Skin:  Negative for itching and rash.  Neurological:  Negative for extremity weakness.  Hematological:  Negative for adenopathy.  Psychiatric/Behavioral:  Negative for confusion.     PHYSICAL EXAMINATION: ECOG PERFORMANCE STATUS: 0 - Asymptomatic Vitals:   06/30/23 1029  BP: 109/70  Pulse: 79  Resp: 18  Temp: (!) 96.9 F (36.1 C)  SpO2: 100%   Filed Weights   06/30/23 1029  Weight: 110 lb 6.4 oz (50.1 kg)    Physical Exam Constitutional:      General: She is not in acute distress. HENT:     Head: Normocephalic and atraumatic.  Eyes:     General: No scleral icterus. Cardiovascular:     Rate and Rhythm: Normal rate and regular rhythm.     Heart sounds: Normal heart sounds.  Pulmonary:     Effort: Pulmonary effort is normal. No respiratory distress.     Breath sounds: No wheezing.  Abdominal:     General: Bowel sounds are normal. There is no distension.     Palpations: Abdomen is soft.  Musculoskeletal:        General: No deformity. Normal range of motion.     Cervical back: Normal range of motion and neck supple.  Skin:    General: Skin is warm and dry.     Findings: No erythema or rash.  Neurological:     Mental Status: She is alert and oriented to person, place, and time. Mental status is at baseline.     Cranial Nerves: No cranial nerve deficit.     Coordination: Coordination normal.  Psychiatric:        Mood and Affect: Mood normal.      LABORATORY DATA:  I have reviewed the data as listed    Latest Ref Rng & Units 06/30/2023   10:14 AM 12/01/2022    9:44 AM 11/25/2022    2:26 PM  CBC  WBC 4.0 - 10.5 K/uL 3.2  3.3  3.4   Hemoglobin 12.0 - 15.0 g/dL 66.4  40.3  47.4   Hematocrit 36.0 - 46.0 % 37.0  38.4  38.0   Platelets 150 - 400 K/uL 84  94  94       Latest Ref Rng & Units 06/30/2023   10:14 AM 11/25/2022    2:26 PM 09/02/2022    8:29 AM  CMP  Glucose 70 - 99 mg/dL 259  563  875   BUN 8 - 23 mg/dL  11  9  13    Creatinine 0.44 - 1.00 mg/dL 6.43  3.29  5.18   Sodium 135 - 145 mmol/L 137  143  139   Potassium 3.5 - 5.1 mmol/L 3.6  3.8  3.8   Chloride 98 - 111 mmol/L 105  101  104   CO2 22 - 32 mmol/L 26  29  29    Calcium 8.9 - 10.3 mg/dL 9.3  84.1  9.4   Total Protein 6.5 - 8.1 g/dL 6.9  7.4    Total Bilirubin 0.3 - 1.2 mg/dL 0.4  0.3    Alkaline Phos 38 - 126 U/L 64  87    AST 15 - 41 U/L 20  19    ALT 0 - 44 U/L 12  10       RADIOGRAPHIC STUDIES: I have personally reviewed the radiological images as listed and agreed with the findings in the report.  CUP PACEART REMOTE DEVICE CHECK  Result Date: 06/27/2023 Scheduled remote  reviewed. Normal device function.  Within the monitoring period, HF diagnostics have been normal.  Next remote 91 days. - CS, CVRS  DG Bone Density  Result Date: 04/11/2023 EXAM: DUAL X-RAY ABSORPTIOMETRY (DXA) FOR BONE MINERAL DENSITY IMPRESSION: Your patient LARUA PEARCY completed a FRAX assessment on 04/11/2023 using the [employer] Advance DXA System (analysis version: 14.10) manufactured by [employer]. The following summarizes the results of our evaluation. Technologist: SCE PATIENT BIOGRAPHICAL: Name: Cambria, Umeda Patient ID: [id number] Birth Date: 10-02-1946 Height:    61.0 in. Gender:     [age]    [age]       Weight:    110.8 lbs. Ethnicity:  Black                            Exam Date: 04/11/2023 FRAX* RESULTS:  (version: 3.5) 10-year Probability of Fracture1 Major Osteoporotic Fracture2 Hip Fracture 5.9% 1.7% Population: [place] (Black) Risk Factors: None Based on Femur (Right) Neck BMD 1 -The 10-year probability of fracture may be lower than reported if the patient has received treatment. 2 -Major Osteoporotic Fracture: Clinical Spine, Forearm, Hip or Shoulder *FRAX is a Armed forces logistics/support/administrative officer of the [employer] of [employer] for Metabolic Bone Disease, a World Science writer (WHO) [employer]. ASSESSMENT: The  probability of a major osteoporotic fracture is 5.9% within the next ten years. The probability of a hip fracture is 1.7% within the next ten years. Your patient Kevan Deniston completed a BMD test on 04/11/2023 using the [employer] Advance DXA System (analysis version: 14.10) manufactured by [employer]. The following summarizes the results of our evaluation. Technologist: SCE PATIENT BIOGRAPHICAL: Name: Adisyn, Kaman Patient ID: [id number] Birth Date: [birth year] Height: 61.0 in. Gender: Female Exam Date: 04/11/2023 Weight: 110.8 lbs. Indications: Diabetes, Hypothyroidism, Hysterectomy, Oophorectomy Bilateral, Osteoporotic, POSTmenopausal, Vit D Deficiency Fractures: Treatments: Janumet, Levothyroxine, Multivitamin, Protonix ASSESSMENT: The BMD measured at Femur Neck Right is 0.748 g/cm2 with a T-score of -2.1. This patient is considered osteopenic according to World Health Organization [facility]) criteria. The scan quality is good. Site Region Measured Measured WHO Young Adult BMD Date       Age      Classification T-score AP Spine L1-L4 4/11/[age] Osteopenia -1.9 0.954 g/cm2 DualFemur Neck Right 04/11/2023 76.9 Osteopenia -2.1 0.748 g/cm2 World Health Organization [facility]) criteria for post-menopausal, Caucasian Women: Normal:       T-score at or above -1 SD Osteopenia:   T-score between -1 and -2.5 SD Osteoporosis: T-score at or below -2.5 SD RECOMMENDATIONS: 1. All patients should optimize calcium and vitamin D intake. 2. Consider FDA-approved medical therapies in postmenopausal women and men aged 36 years and older, based on the following: a. A hip or vertebral (clinical or morphometric) fracture b. T-score < -2.5 at the femoral neck or spine after appropriate evaluation to exclude secondary causes c. Low bone mass (T-score between -1.0 and -2.5 at the femoral neck or spine) and a 10-year probability of a hip fracture > 3% or a 10-year probability of a major osteoporosis-related fracture > 20% based on the  [place]-adapted WHO algorithm d. Clinician judgment and/or patient preferences may indicate treatment for people with 10-year fracture probabilities above or below these levels FOLLOW-UP: People with diagnosed cases of osteoporosis or at high risk for fracture should have regular bone mineral density tests. For patients eligible for Medicare, routine testing is allowed once every 2  years. The testing frequency can be increased to one year for patients who have rapidly progressing disease, those who are receiving or discontinuing medical therapy to restore bone mass, or have additional risk factors. I have reviewed this report, and agree with the above findings. [facility] Electronically Signed   By: Gerome Sam III M.D.   On: 04/11/2023 09:57

## 2023-06-30 NOTE — Assessment & Plan Note (Addendum)
Per rheumatology, she does not have rheumatology disorders.

## 2023-07-05 ENCOUNTER — Other Ambulatory Visit (HOSPITAL_COMMUNITY): Payer: Self-pay | Admitting: Student

## 2023-07-05 DIAGNOSIS — D696 Thrombocytopenia, unspecified: Secondary | ICD-10-CM

## 2023-07-05 NOTE — Progress Notes (Signed)
Patient for IR Bone Marrow Biopsy on Thurs 07/06/2023, I called and spoke with the patient on the phone and gave pre-procedure instructions. Pt was made aware to be here at 7:30a, NPO after MN prior to procedure as well as driver post procedure/recovery/discharge. Pt stated understanding.  Called 07/05/2023

## 2023-07-06 ENCOUNTER — Ambulatory Visit
Admission: RE | Admit: 2023-07-06 | Discharge: 2023-07-06 | Disposition: A | Discharge status: Home / Self Care | Payer: Medicare HMO | Source: Ambulatory Visit | Attending: Oncology | Admitting: Oncology

## 2023-07-06 ENCOUNTER — Encounter: Payer: Self-pay | Admitting: Radiology

## 2023-07-06 ENCOUNTER — Other Ambulatory Visit: Payer: Self-pay

## 2023-07-06 DIAGNOSIS — D696 Thrombocytopenia, unspecified: Secondary | ICD-10-CM | POA: Diagnosis present

## 2023-07-06 DIAGNOSIS — Z95810 Presence of automatic (implantable) cardiac defibrillator: Secondary | ICD-10-CM | POA: Diagnosis not present

## 2023-07-06 DIAGNOSIS — K219 Gastro-esophageal reflux disease without esophagitis: Secondary | ICD-10-CM | POA: Diagnosis not present

## 2023-07-06 DIAGNOSIS — Z1379 Encounter for other screening for genetic and chromosomal anomalies: Secondary | ICD-10-CM | POA: Insufficient documentation

## 2023-07-06 DIAGNOSIS — I420 Dilated cardiomyopathy: Secondary | ICD-10-CM | POA: Insufficient documentation

## 2023-07-06 DIAGNOSIS — E785 Hyperlipidemia, unspecified: Secondary | ICD-10-CM | POA: Insufficient documentation

## 2023-07-06 DIAGNOSIS — F419 Anxiety disorder, unspecified: Secondary | ICD-10-CM | POA: Insufficient documentation

## 2023-07-06 DIAGNOSIS — I509 Heart failure, unspecified: Secondary | ICD-10-CM | POA: Diagnosis not present

## 2023-07-06 DIAGNOSIS — E119 Type 2 diabetes mellitus without complications: Secondary | ICD-10-CM | POA: Insufficient documentation

## 2023-07-06 HISTORY — PX: IR BONE MARROW BIOPSY & ASPIRATION: IMG5727

## 2023-07-06 LAB — CBC WITH DIFFERENTIAL/PLATELET
Abs Immature Granulocytes: 0.01 10*3/uL (ref 0.00–0.07)
Basophils Absolute: 0 10*3/uL (ref 0.0–0.1)
Basophils Relative: 1 %
Eosinophils Absolute: 0 10*3/uL (ref 0.0–0.5)
Eosinophils Relative: 1 %
HCT: 38.5 % (ref 36.0–46.0)
Hemoglobin: 12.4 g/dL (ref 12.0–15.0)
Immature Granulocytes: 0 %
Lymphocytes Relative: 40 %
Lymphs Abs: 1.5 10*3/uL (ref 0.7–4.0)
MCH: 29.2 pg (ref 26.0–34.0)
MCHC: 32.2 g/dL (ref 30.0–36.0)
MCV: 90.6 fL (ref 80.0–100.0)
Monocytes Absolute: 0.3 10*3/uL (ref 0.1–1.0)
Monocytes Relative: 9 %
Neutro Abs: 1.8 10*3/uL (ref 1.7–7.7)
Neutrophils Relative %: 49 %
Platelets: 87 10*3/uL — ABNORMAL LOW (ref 150–400)
RBC: 4.25 MIL/uL (ref 3.87–5.11)
RDW: 12.8 % (ref 11.5–15.5)
WBC: 3.6 10*3/uL — ABNORMAL LOW (ref 4.0–10.5)
nRBC: 0 % (ref 0.0–0.2)

## 2023-07-06 MED ORDER — HEPARIN SOD (PORK) LOCK FLUSH 100 UNIT/ML IV SOLN
INTRAVENOUS | Status: AC
  Filled 2023-07-06: qty 5

## 2023-07-06 MED ORDER — FENTANYL CITRATE (PF) 100 MCG/2ML IJ SOLN
INTRAMUSCULAR | Status: AC | PRN
  Administered 2023-07-06: 50 ug via INTRAVENOUS

## 2023-07-06 MED ORDER — MIDAZOLAM HCL 2 MG/2ML IJ SOLN
INTRAMUSCULAR | Status: AC
  Filled 2023-07-06: qty 2

## 2023-07-06 MED ORDER — FENTANYL CITRATE (PF) 100 MCG/2ML IJ SOLN
INTRAMUSCULAR | Status: AC
  Filled 2023-07-06: qty 2

## 2023-07-06 MED ORDER — SODIUM CHLORIDE 0.9 % IV SOLN: INTRAVENOUS | Status: DC

## 2023-07-06 MED ORDER — MIDAZOLAM HCL 2 MG/2ML IJ SOLN
INTRAMUSCULAR | Status: AC | PRN
  Administered 2023-07-06: .5 mg via INTRAVENOUS

## 2023-07-06 MED ORDER — LIDOCAINE 1 % OPTIME INJ - NO CHARGE
10.0000 mL | Freq: Once | INTRAMUSCULAR | Status: AC
  Administered 2023-07-06: 10 mL via INTRADERMAL
  Filled 2023-07-06: qty 10

## 2023-07-06 NOTE — H&P (Signed)
Chief Complaint: Patient was seen in consultation today for thrombocytopenia.  Referring Physician(s): Yu,Zhou  Supervising Physician: Pernell Dupre  Patient Status: [facility] - Out-pt  History of Present Illness: Bethany [name] is a 77 y.o. female with PMH significant for anemia, anxiety, CHF, depression, diabetes, dilated cardiomyopathy, dyspnea, dysrhytmia with AICD, ankle edema, GERD, hyperlipidemia, and valvular heart disease being seen today in relation to chronic thrombocytopenia. Patient has been under the care of Dr Cathie Hoops from hematology service. Patient has had thrombocytopenia work-up under Dr Cathie Hoops that has not revealed source of her thrombocytopenia. Patient has been referred to IR by Dr Cathie Hoops to further evaluate her thrombocytopenia.   Past Medical History:  Diagnosis Date   AICD (automatic cardioverter/defibrillator) present    Anemia    Anxiety    CHF (congestive heart failure) (HCC)    Complication of anesthesia    Depression    Diabetes (HCC)    Dilated cardiomyopathy (HCC)    Diverticulosis    Dyspnea    Dysrhythmia    Edema    FEET/ANKLES OCCAS   GERD (gastroesophageal reflux disease)    Hyperlipidemia    Hypothyroidism    LV dysfunction    Orthopnea    USES 3 PILLOWS   PONV (postoperative nausea and vomiting)    Reflux    Thrombocytopenia (HCC)    Thyroid disease    VHD (valvular heart disease)     Past Surgical History:  Procedure Laterality Date   ABDOMINAL HYSTERECTOMY     BI-VENTRICULAR IMPLANTABLE CARDIOVERTER DEFIBRILLATOR [name] 02/11/2015   Procedure: BI-VENTRICULAR IMPLANTABLE CARDIOVERTER DEFIBRILLATOR  (CRT-D);  Surgeon: Duke Salvia, MD;  Location: [facility] CATH LAB;  Service: Cardiovascular;  Laterality: [name];   BONE MARROW BIOPSY  2007   spine   BREAST BIOPSY Right    neg   BREAST EXCISIONAL BIOPSY Right    CARDIAC CATHETERIZATION     CATARACT EXTRACTION W/PHACO Right 01/03/2017   Procedure: CATARACT EXTRACTION PHACO AND INTRAOCULAR LENS  PLACEMENT (IOC);  Surgeon: Lockie Mola, MD;  Location: [facility] ORS;  Service: Ophthalmology;  Laterality: Right;  [place] 00:50 AP% 19.5 CDE 9.94 fluid pack lot # [id number]   CATARACT EXTRACTION W/PHACO Left 10/27/2016   Procedure: CATARACT EXTRACTION PHACO AND INTRAOCULAR LENS PLACEMENT (IOC);  Surgeon: Lockie Mola, MD;  Location: [facility] ORS;  Service: Ophthalmology;  Laterality: Left;  [place]  01:12 AP%  21.0 CDE  11.59 Fluid pack lot # [id number]   CHOLECYSTECTOMY     ICD GENERATOR CHANGEOUT [name] 03/29/2022   Procedure: ICD GENERATOR CHANGEOUT;  Surgeon: Duke Salvia, MD;  Location: [facility] INVASIVE CV LAB;  Service: Cardiovascular;  Laterality: [name];   INSERT / REPLACE / REMOVE PACEMAKER     AICD   LEAD REVISION [name] 02/11/2015   Procedure: LEAD REVISION;  Surgeon: Duke Salvia, MD;  Location: [facility] CATH LAB;  Service: Cardiovascular;  Laterality: [name];   PARTIAL HYSTERECTOMY  1977   TOOTH EXTRACTION  09/23/2015    Allergies: Other and Sulfa antibiotics  Medications: Prior to Admission medications   Medication Sig Start Date End Date Taking? Authorizing Provider  acetaminophen (TYLENOL) 500 MG tablet Take 500 mg by mouth at bedtime as needed.    [provider]  Alcohol Swabs (DROPSAFE ALCOHOL PREP) 70 % PADS USE ONCE DAILY 02/22/23   Malva Limes, MD  aspirin EC 81 MG tablet Take 81 mg by mouth daily.    [provider]  atorvastatin (LIPITOR) 20 MG tablet TAKE 1 TABLET  EVERY DAY 01/16/23   Malva Limes, MD  Blood Glucose Calibration (TRUE METRIX LEVEL 1) Low SOLN Use as directed with glucose meter 06/29/20   Malva Limes, MD  carvedilol (COREG) 3.125 MG tablet TAKE 1 TABLET TWICE DAILY WITH MEALS (NEED MD APPOINTMENT) 06/27/23   Malva Limes, MD  dapagliflozin propanediol (FARXIGA) 10 MG TABS tablet Take 1 tablet (10 mg total) by mouth daily. 02/06/23   Malva Limes, MD  ferrous sulfate 325 (65 FE) MG tablet Take 325 mg by mouth 2 (two) times daily  with a meal.    [provider]  furosemide (LASIX) 40 MG tablet Take 1 tablet (40 mg) by mouth every other day 01/11/22   Duke Salvia, MD  glucose blood (TRUE METRIX BLOOD GLUCOSE TEST) test strip CHECK BLOOD SUGAR EVERY DAY 06/26/22   Malva Limes, MD  JANUMET XR [phone] MG TB24 TAKE 1 TABLET EVERY DAY 07/27/22   Malva Limes, MD  levothyroxine (SYNTHROID) 50 MCG tablet Take 1 tablet (50 mcg total) by mouth daily before breakfast. Please schedule office visit before any future refills. 05/29/23   Malva Limes, MD  Multiple Vitamin (MULTIVITAMIN) tablet Take 1 tablet by mouth daily.    [provider]  pantoprazole (PROTONIX) 40 MG tablet TAKE 1 TABLET EVERY DAY 09/21/22   Malva Limes, MD  sacubitril-valsartan (ENTRESTO) 24-26 MG Take 0.5 tablets by mouth 2 (two) times daily. 10/17/19   [provider]  senna (SENOKOT) 8.6 MG tablet Take 1 tablet by mouth daily as needed.    [provider]  TRUEplus Lancets 33G MISC CHECK BLOOD SUGAR ONE TIME DAILY 07/27/22   Malva Limes, MD     Family History  Problem Relation Age of Onset   Heart attack Mother    Prostate cancer Father    Ulcers Father    Heart disease Sister    Stroke Sister    Heart attack Sister    Diabetes Sister    Heart disease Sister    Diabetes Sister    Heart attack Sister    Diabetes Sister    Diabetes Sister    Diabetes Sister    Diabetes Brother    Heart attack Brother    Stroke Brother    Breast cancer Neg Hx     Social History   Socioeconomic History   Marital status: Divorced    Spouse name: Not on file   Number of children: 4   Years of education: Not on file   Highest education level: GED or equivalent  Occupational History   Occupation: retired  Tobacco Use   Smoking status: Never   Smokeless tobacco: Former    Types: Snuff    Quit date: 10/27/1969  Vaping Use   Vaping status: Never Used  Substance and Sexual Activity   Alcohol use: No     Alcohol/week: 0.0 standard drinks of alcohol   Drug use: No   Sexual activity: Not on file  Other Topics Concern   Not on file  Social History Narrative   Not on file   Social Determinants of Health   Financial Resource Strain: Low Risk  (12/20/2022)   Overall Financial Resource Strain (CARDIA)    Difficulty of Paying Living Expenses: Not hard at all  Food Insecurity: No Food Insecurity (05/24/2022)   Hunger Vital Sign    Worried About Running Out of Food in the Last Year: Never true    Ran Out  of Food in the Last Year: Never true  Transportation Needs: No Transportation Needs (12/20/2022)   PRAPARE - Administrator, Civil Service (Medical): No    Lack of Transportation (Non-Medical): No  Physical Activity: Insufficiently Active (12/20/2022)   Exercise Vital Sign    Days of Exercise per Week: 3 days    Minutes of Exercise per Session: 30 min  Stress: No Stress Concern Present (12/20/2022)   [employer] of Occupational Health - Occupational Stress Questionnaire    Feeling of Stress : Only a little  Social Connections: Moderately Integrated (12/20/2022)   Social Connection and Isolation Panel [NHANES]    Frequency of Communication with Friends and Family: More than three times a week    Frequency of Social Gatherings with Friends and Family: More than three times a week    Attends Religious Services: More than 4 times per year    Active Member of [employer] or Organizations: Yes    Attends Engineer, structural: More than 4 times per year    Marital Status: Divorced    Code Status: Full Code  Review of Systems: A 12 point ROS discussed and pertinent positives are indicated in the HPI above.  All other systems are negative.  Review of Systems  Constitutional:  Negative for chills and fever.  Respiratory:  Negative for chest tightness and shortness of breath.   Cardiovascular:  Positive for leg swelling. Negative for chest pain.  Gastrointestinal:  Positive  for diarrhea. Negative for abdominal pain, nausea and vomiting.  Neurological:  Positive for dizziness and headaches.  Psychiatric/Behavioral:  Negative for confusion.     Vital Signs: BP 111/72   Pulse 72   Temp 98 F (36.7 C) (Oral)   Resp 16   Ht 5\' 1"  (1.549 m)   Wt 110 lb (49.9 kg)   LMP  (LMP Unknown)   BMI 20.78 kg/m    Physical Exam Vitals reviewed.  Constitutional:      General: She is not in acute distress. Cardiovascular:     Rate and Rhythm: Normal rate and regular rhythm.     Pulses: Normal pulses.     Heart sounds: Normal heart sounds.  Pulmonary:     Effort: Pulmonary effort is normal.     Breath sounds: Normal breath sounds.  Abdominal:     Palpations: Abdomen is soft.     Tenderness: There is no abdominal tenderness.  Musculoskeletal:     Right lower leg: Edema present.     Left lower leg: Edema present.     Comments: Trace pitting edema in bilateral lower extremities  Skin:    General: Skin is warm and dry.  Neurological:     Mental Status: She is alert and oriented to person, place, and time.  Psychiatric:        Mood and Affect: Mood normal.        Behavior: Behavior normal.        Thought Content: Thought content normal.        Judgment: Judgment normal.     Imaging: CUP PACEART REMOTE DEVICE CHECK  Result Date: 06/27/2023 Scheduled remote reviewed. Normal device function.  Within the monitoring period, HF diagnostics have been normal.  Next remote 91 days. - CS, CVRS   Labs:  CBC: Recent Labs    09/02/22 0829 11/25/22 1426 12/01/22 0944 06/30/23 1014  WBC 3.4* 3.4 3.3* 3.2*  HGB 12.1 12.5 12.0 11.9*  HCT 38.0 38.0 38.4 37.0  PLT  93* 94* 94* 84*    COAGS: No results for input(s): "INR", "APTT" in the last 8760 hours.  BMP: Recent Labs    09/02/22 0829 11/25/22 1426 06/30/23 1014  NA 139 143 137  K 3.8 3.8 3.6  CL 104 101 105  CO2 29 29 26   GLUCOSE 120* 181* 146*  BUN 13 9 11   CALCIUM 9.4 10.0 9.3  CREATININE 0.83  0.94 0.79  GFRNONAA >60  --  >60    LIVER FUNCTION TESTS: Recent Labs    11/25/22 1426 06/30/23 1014  BILITOT 0.3 0.4  AST 19 20  ALT 10 12  ALKPHOS 87 64  PROT 7.4 6.9  ALBUMIN 4.5 4.1    TUMOR MARKERS: No results for input(s): "AFPTM", "CEA", "CA199", "CHROMGRNA" in the last 8760 hours.  Assessment and Plan:  Bethany [name] is a 77 yo female being seen today in relation to thrombocytopenia. Patient is under the care of Dr Cathie Hoops from Hematology service who has referred patient to IR for image-guided bone marrow biopsy. Patient presents today in their usual state of health and is NPO. Case has been reviewed with Dr Juliette Alcide and is scheduled to proceed on 07/06/23.  Risks and benefits of image-guided bone marrow biopsy was discussed with the patient and/or patient's family including, but not limited to bleeding, infection, damage to adjacent structures or low yield requiring additional tests.  All of the questions were answered and there is agreement to proceed.  Consent signed and in chart.   Thank you for this interesting consult.  I greatly enjoyed meeting Bethany [name] and look forward to participating in their care.  A copy of this report was sent to the requesting provider on this date.  Electronically Signed: Kennieth Francois, PA-C 07/06/2023, 7:43 AM   I spent a total of  15 Minutes   in face to face in clinical consultation, greater than 50% of which was counseling/coordinating care for thrombocytopenia.

## 2023-07-06 NOTE — Progress Notes (Signed)
Patient clinically stable post IR BMB per Dr Juliette Alcide, tolerated well. Vitals stable pre and post procedure. Denies complaints post procedure. Received Versed 0.5 mg along with Fentanyl 50 mcg IV for procedure. Report given to Mar Daring Rn /specials/15.

## 2023-07-07 NOTE — Progress Notes (Signed)
Remote ICD transmission.   

## 2023-07-13 LAB — SURGICAL PATHOLOGY

## 2023-07-14 LAB — MISC LABCORP TEST (SEND OUT): Labcorp test code: 481222

## 2023-07-18 ENCOUNTER — Encounter (HOSPITAL_COMMUNITY): Payer: Self-pay

## 2023-07-29 ENCOUNTER — Other Ambulatory Visit: Payer: Self-pay | Admitting: Family Medicine

## 2023-07-29 DIAGNOSIS — E119 Type 2 diabetes mellitus without complications: Secondary | ICD-10-CM

## 2023-07-31 ENCOUNTER — Telehealth: Payer: Self-pay

## 2023-07-31 ENCOUNTER — Ambulatory Visit: Payer: Self-pay

## 2023-07-31 NOTE — Telephone Encounter (Signed)
Called pt - left message on machine to return call and schedule OV.

## 2023-07-31 NOTE — Telephone Encounter (Signed)
Please schedule MD only in 2-3 weeks, non urgent, for biopsy results review.

## 2023-07-31 NOTE — Telephone Encounter (Signed)
  Chief Complaint: Dizziness - Seizure??? Symptoms: Dizzy Frequency: 1-2 times for the past few days. Seems to be getting better Pertinent Negatives: Patient denies  Disposition: [] ED /[] Urgent Care (no appt availability in office) / [x] Appointment(In office/virtual)/ []  [facility]/ [] Home Care/ [] Refused Recommended Disposition /[] [facility]/ []  Follow-up with PCP Additional Notes: Pt states that for the last couple of days she is having little episodes where she feels dizzy. She thinks that this is what a seizure feels like but not a whole seizure. Her eyes roll a bit. It seems to happen when she moves her head suddenly. Appt for tomorrow morning.  Summary: Dizziness Advice   Pt is calling to report that she has been dizzy for a couple of days. Apt was scheduled for tomorrow at 8am for medication refill.     Reason for Disposition  [1] MODERATE dizziness (e.g., interferes with normal activities) AND [2] has NOT been evaluated by doctor (or NP/PA) for this  (Exception: Dizziness caused by heat exposure, sudden standing, or poor fluid intake.)  Answer Assessment - Initial Assessment Questions 1. DESCRIPTION: "Describe your dizziness."     Feels weird  - eyes roll . 2. LIGHTHEADED: "Do you feel lightheaded?" (e.g., somewhat faint, woozy, weak upon standing)     Feels like what she thinks a seizure feels like 3. VERTIGO: "Do you feel like either you or the room is spinning or tilting?" (i.e. vertigo)     no 4. SEVERITY: "How bad is it?"  "Do you feel like you are going to faint?" "Can you stand and walk?"   - MILD: Feels slightly dizzy, but walking normally.   - MODERATE: Feels unsteady when walking, but not falling; interferes with normal activities (e.g., school, work).   - SEVERE: Unable to walk without falling, or requires assistance to walk without falling; feels like passing out now.      moderate 5. ONSET:  "When did the dizziness begin?"     A couple of days  - 1-2 times daily 6. AGGRAVATING FACTORS: "Does anything make it worse?" (e.g., standing, change in head position)     Sudden head movement  8. CAUSE: "What do you think is causing the dizziness?"     Sudden head movement 9. RECURRENT SYMPTOM: "Have you had dizziness before?" If Yes, ask: "When was the last time?" "What happened that time?"     30 - 1hour ago - not as bad. 10. OTHER SYMPTOMS: "Do you have any other symptoms?" (e.g., fever, chest pain, vomiting, diarrhea, bleeding)       Nausea  Protocols used: Dizziness - Lightheadedness-A-AH

## 2023-07-31 NOTE — Telephone Encounter (Signed)
-----   Message from Rickard Patience sent at 07/30/2023 12:56 PM EDT ----- Please arrange her to follow up with me in 2-3 weeks, non urgent. Thanks. MD only

## 2023-07-31 NOTE — Telephone Encounter (Signed)
Courtesy refill. Patient will need an office visit for further refills. Requested Prescriptions  Pending Prescriptions Disp Refills   TRUEplus Lancets 33G MISC [Pharmacy Med Name: TRUEplus Lancets 33G Miscellaneous] 100 each 1    Sig: TEST BLOOD SUGAR ONE TIME DAILY     Endocrinology: Diabetes - Testing Supplies Passed - 07/29/2023  2:25 AM      Passed - Valid encounter within last 12 months    Recent Outpatient Visits           8 months ago Weakness   [facility] Alfredia Ferguson, PA-C   1 year ago Lower abdominal pain   [place] [facility] Malva Limes, MD   1 year ago Frequency of urination   [place] [facility] [place], [place], PA-C   1 year ago Adult hypothyroidism   [facility] Malva Limes, MD   1 year ago Hospital discharge follow-up   [facility] [name] T, FNP               SitaGLIPtin-MetFORMIN HCl (JANUMET XR) [phone] MG TB24 [Pharmacy Med Name: Janumet XR Oral Tablet Extended Release 24 Hour [id number] MG] 30 tablet 0    Sig: TAKE 1 TABLET EVERY DAY     Endocrinology:  Diabetes - Biguanide + DPP-4 Inhibitor Combos Failed - 07/29/2023  2:25 AM      Failed - HBA1C is between 0 and 7.9 and within 180 days    Hemoglobin A1C  Date Value Ref Range Status  02/20/2014 6.3 4.2 - 6.3 % Final    Comment:    The American Diabetes Association recommends that a primary goal of therapy should be <7% and that physicians should reevaluate the treatment regimen in patients with HbA1c values consistently >8%.    Hgb A1c MFr Bld  Date Value Ref Range Status  11/25/2022 7.2 (H) 4.8 - 5.6 % Final    Comment:             Prediabetes: 5.7 - 6.4          Diabetes: >6.4          Glycemic control for adults with diabetes: <7.0          Failed - B12 Level in normal range and within 720 days    Vitamin B-12  Date Value Ref Range Status  12/01/2022  1,268 (H) 180 - 914 pg/mL Final    Comment:    (NOTE) This assay is not validated for testing neonatal or myeloproliferative syndrome specimens for Vitamin B12 levels. Performed at [facility], [address], [place], [place] [address]          Failed - Valid encounter within last 6 months    Recent Outpatient Visits           8 months ago Weakness   [place] [facility] Alfredia Ferguson, PA-C   1 year ago Lower abdominal pain   [place] [facility] Malva Limes, MD   1 year ago Frequency of urination   [place] [facility] [place], [place], [place]   1 year ago Adult hypothyroidism   [place] [facility] Malva Limes, MD   1 year ago Hospital discharge follow-up   [facility] [name] T, FNP              Passed - Cr in normal range  and within 360 days    Creatinine  Date Value Ref Range Status  06/30/2023 0.79 0.44 - 1.00 mg/dL Final  16/08/9603 5.40 mg/dL Final    Comment:    9.81-1.91 NOTE: New Reference Range  01/13/15    Creatinine, POC  Date Value Ref Range Status  12/04/2018 n/a mg/dL Final         Passed - eGFR in normal range and within 360 days    EGFR (African American)  Date Value Ref Range Status  02/04/2015 >60  Final   GFR calc Af Amer  Date Value Ref Range Status  07/01/2020 79 >59 mL/min/1.73 Final    Comment:    **[facility] currently reports eGFR in compliance with the current**   recommendations of the [employer]. [facility] will   update reporting as new guidelines are published from the NKF-ASN   Task force.    EGFR (Non-African Amer.)  Date Value Ref Range Status  02/04/2015 >60  Final    Comment:    eGFR values <57mL/min/1.73 m2 may be an indication of chronic kidney disease (CKD). Calculated eGFR is useful in patients with stable renal function. The eGFR calculation will not be reliable in acutely ill  patients when serum creatinine is changing rapidly. It is not useful in patients on dialysis. The eGFR calculation may not be applicable to patients at the low and high extremes of body sizes, pregnant women, and vegetarians.    GFR, Estimated  Date Value Ref Range Status  06/30/2023 >60 >60 mL/min Final    Comment:    (NOTE) Calculated using the CKD-EPI Creatinine Equation (2021)    GFR  Date Value Ref Range Status  02/05/2015 88.96 >60.00 mL/min Final   eGFR  Date Value Ref Range Status  11/25/2022 63 >59 mL/min/1.73 Final         Passed - CBC within normal limits and completed in the last 12 months    WBC  Date Value Ref Range Status  07/06/2023 3.6 (L) 4.0 - 10.5 K/uL Final   RBC  Date Value Ref Range Status  07/06/2023 4.25 3.87 - 5.11 MIL/uL Final   Hemoglobin  Date Value Ref Range Status  07/06/2023 12.4 12.0 - 15.0 g/dL Final  47/82/9562 13.0 11.1 - 15.9 g/dL Final   HCT  Date Value Ref Range Status  07/06/2023 38.5 36.0 - 46.0 % Final   Hematocrit  Date Value Ref Range Status  11/25/2022 38.0 34.0 - 46.6 % Final   MCHC  Date Value Ref Range Status  07/06/2023 32.2 30.0 - 36.0 g/dL Final   [facility]  Date Value Ref Range Status  07/06/2023 29.2 26.0 - 34.0 pg Final   MCV  Date Value Ref Range Status  07/06/2023 90.6 80.0 - 100.0 fL Final  11/25/2022 92 79 - 97 fL Final  02/04/2015 92 80 - 100 fL Final   No results found for: "PLTCOUNTKUC", "LABPLAT", "POCPLA" RDW  Date Value Ref Range Status  07/06/2023 12.8 11.5 - 15.5 % Final  11/25/2022 12.7 11.7 - 15.4 % Final  02/04/2015 13.2 11.5 - 14.5 % Final

## 2023-08-01 ENCOUNTER — Ambulatory Visit (INDEPENDENT_AMBULATORY_CARE_PROVIDER_SITE_OTHER): Payer: Medicare HMO | Admitting: Family Medicine

## 2023-08-01 VITALS — BP 102/65 | HR 71 | Temp 98.4°F | Ht 61.0 in | Wt 110.0 lb

## 2023-08-01 DIAGNOSIS — E7800 Pure hypercholesterolemia, unspecified: Secondary | ICD-10-CM | POA: Diagnosis not present

## 2023-08-01 DIAGNOSIS — I5022 Chronic systolic (congestive) heart failure: Secondary | ICD-10-CM | POA: Diagnosis not present

## 2023-08-01 DIAGNOSIS — E039 Hypothyroidism, unspecified: Secondary | ICD-10-CM | POA: Diagnosis not present

## 2023-08-01 DIAGNOSIS — E1169 Type 2 diabetes mellitus with other specified complication: Secondary | ICD-10-CM

## 2023-08-01 DIAGNOSIS — R42 Dizziness and giddiness: Secondary | ICD-10-CM | POA: Diagnosis not present

## 2023-08-01 DIAGNOSIS — E559 Vitamin D deficiency, unspecified: Secondary | ICD-10-CM

## 2023-08-01 NOTE — Progress Notes (Signed)
Established patient visit   Patient: Bethany [name]   DOB: 03-25-1946   77 y.o. Female  MRN: [id number] Visit Date: 08/01/2023  Today's healthcare provider: [name], MD   Chief Complaint  Patient presents with   Diabetes    Patient was last seen for this in January.  She reports good compliance and tolerance to her medications.  She has been getting readings at home that range from 80's-122.  She denies symptoms of hypoglycemia.   Hypothyroidism    Patient has not had this checked since August 2023   Hypertension    Patient checks this daily and has been getting readings that have been ranging 90-111 over 60-70's   Hyperlipidemia    Patient reports good compliance and tolerance to her medication.   Subjective    Discussed the use of AI scribe software for clinical note transcription with the patient, who gave verbal consent to proceed.  History of Present Illness   Bethany [name], a patient with a history of diabetes, hypertension, hyperlipidemia, HFrEF, and thyroid disease, presents for follow up of her multiple problems and with dizziness that has been ongoing for a few months. The episodes of dizziness are described as a sensation of lightheadedness, which can occur both when sitting and standing. The patient reports one particularly severe episode where she experienced a strange sensation starting in the left side of her head, accompanied by eye fluttering, and a feeling of near syncope, though she did not lose consciousness. The patient also reports occasional dizziness when moving her head quickly, but this is distinct from the aforementioned episodes.  In addition to the dizziness, the patient has noticed an increase in shortness of breath during everyday activities, though it is unclear if this is a new or worsening symptom. The patient's blood glucose levels have been stable, with morning readings around 100.     Lab Results  Component Value Date   HGBA1C 7.2 (H)  11/25/2022   Lab Results  Component Value Date   NA 137 06/30/2023   CL 105 06/30/2023   K 3.6 06/30/2023   CO2 26 06/30/2023   BUN 11 06/30/2023   CREATININE 0.79 06/30/2023   GFRNONAA >60 06/30/2023   CALCIUM 9.3 06/30/2023   PHOS 2.8 (L) 07/01/2020   ALBUMIN 4.1 06/30/2023   GLUCOSE 146 (H) 06/30/2023   Lab Results  Component Value Date   TSH 2.520 06/27/2022   Lab Results  Component Value Date   VD25OH 50.6 01/26/2022   Lab Results  Component Value Date   WBC 3.6 (L) 07/06/2023   HGB 12.4 07/06/2023   HCT 38.5 07/06/2023   MCV 90.6 07/06/2023   PLT 87 (L) 07/06/2023     Medications: Outpatient Medications Prior to Visit  Medication Sig   acetaminophen (TYLENOL) 500 MG tablet Take 500 mg by mouth at bedtime as needed.   Alcohol Swabs (DROPSAFE ALCOHOL PREP) 70 % PADS USE ONCE DAILY   aspirin EC 81 MG tablet Take 81 mg by mouth daily.   atorvastatin (LIPITOR) 20 MG tablet TAKE 1 TABLET EVERY DAY   Blood Glucose Calibration (TRUE METRIX LEVEL 1) Low SOLN Use as directed with glucose meter   carvedilol (COREG) 3.125 MG tablet TAKE 1 TABLET TWICE DAILY WITH MEALS (NEED MD APPOINTMENT)   dapagliflozin propanediol (FARXIGA) 10 MG TABS tablet Take 1 tablet (10 mg total) by mouth daily.   ferrous sulfate 325 (65 FE) MG tablet Take 325 mg by mouth 2 (  two) times daily with a meal.   furosemide (LASIX) 40 MG tablet Take 1 tablet (40 mg) by mouth every other day   glucose blood (TRUE METRIX BLOOD GLUCOSE TEST) test strip CHECK BLOOD SUGAR EVERY DAY   levothyroxine (SYNTHROID) 50 MCG tablet Take 1 tablet (50 mcg total) by mouth daily before breakfast. Please schedule office visit before any future refills.   Multiple Vitamin (MULTIVITAMIN) tablet Take 1 tablet by mouth daily.   pantoprazole (PROTONIX) 40 MG tablet TAKE 1 TABLET EVERY DAY   sacubitril-valsartan (ENTRESTO) 24-26 MG Take 0.5 tablets by mouth 2 (two) times daily.   senna (SENOKOT) 8.6 MG tablet Take 1 tablet by  mouth daily as needed.   SitaGLIPtin-MetFORMIN HCl (JANUMET XR) [phone] MG TB24 TAKE 1 TABLET EVERY DAY   TRUEplus Lancets 33G MISC TEST BLOOD SUGAR ONE TIME DAILY   No facility-administered medications prior to visit.        Objective    BP 102/65 (BP Location: Left Arm, Patient Position: Sitting, Cuff Size: Normal)   Pulse 71   Temp 98.4 F (36.9 C) (Oral)   Ht 5\' 1"  (1.549 m)   Wt 110 lb (49.9 kg)   LMP  (LMP Unknown)   SpO2 100%   BMI 20.78 kg/m   Physical Exam   General: Appearance:    Well developed, well nourished female in no acute distress  Eyes:    PERRL, conjunctiva/corneas clear, EOM's intact       Lungs:     Clear to auscultation bilaterally, respirations unlabored  Heart:    Normal heart rate. Normal rhythm.  2/6 systolic murmur at right upper sternal border   MS:   All extremities are intact.    Neurologic:   Awake, alert, oriented x 3. No apparent focal neurological defect.         Assessment & Plan        Dizziness New onset, intermittent, occurring both at rest and with head movement. No associated syncope. Possible positional vertigo or transient ischemic attack (TIA). -Order comprehensive metabolic panel, complete blood count, thyroid function tests, lipid panel, and HbA1c to assess for metabolic causes. -Consider CT scan if symptoms persist to rule out TIA.  Diabetes Mellitus Self-reported good control with fasting blood sugars around 100. -Continue current management. -Check HbA1c.  Hypertension No mention of current control or medication regimen. -Check blood pressure and adjust medications as needed.  Hyperlipidemia No mention of current control or medication regimen. -Check lipid panel and adjust medications as needed.  Hypothyroidism No mention of current control or medication regimen. -Check thyroid function tests and adjust medications as needed.  Shortness of breath Increased with daily activities, no further details  provided. -Assess lung and heart function during physical examination. -Consider further workup if symptoms persist or worsen.  HFrEF Continue current medications and scheduled cardiology follow up appointment    No follow-ups on file.      [name], MD  [facility] [phone] (phone) [phone] (fax)  [facility]

## 2023-08-01 NOTE — Patient Instructions (Signed)
.   Please review the attached list of medications and notify my office if there are any errors.   . Please bring all of your medications to every appointment so we can make sure that our medication list is the same as yours.   

## 2023-08-02 ENCOUNTER — Other Ambulatory Visit: Payer: Self-pay

## 2023-08-02 ENCOUNTER — Emergency Department: Payer: Medicare HMO

## 2023-08-02 ENCOUNTER — Ambulatory Visit: Payer: Self-pay

## 2023-08-02 ENCOUNTER — Emergency Department
Admission: EM | Admit: 2023-08-02 | Discharge: 2023-08-02 | Disposition: A | Discharge status: Home / Self Care | Payer: Medicare HMO | Attending: Emergency Medicine | Admitting: Emergency Medicine

## 2023-08-02 ENCOUNTER — Encounter: Payer: Self-pay | Admitting: *Deleted

## 2023-08-02 DIAGNOSIS — R42 Dizziness and giddiness: Secondary | ICD-10-CM | POA: Diagnosis not present

## 2023-08-02 DIAGNOSIS — I10 Essential (primary) hypertension: Secondary | ICD-10-CM | POA: Diagnosis not present

## 2023-08-02 DIAGNOSIS — E119 Type 2 diabetes mellitus without complications: Secondary | ICD-10-CM | POA: Diagnosis not present

## 2023-08-02 DIAGNOSIS — R55 Syncope and collapse: Secondary | ICD-10-CM | POA: Diagnosis not present

## 2023-08-02 DIAGNOSIS — R110 Nausea: Secondary | ICD-10-CM | POA: Diagnosis not present

## 2023-08-02 DIAGNOSIS — R61 Generalized hyperhidrosis: Secondary | ICD-10-CM | POA: Diagnosis not present

## 2023-08-02 LAB — VITAMIN D 25 HYDROXY (VIT D DEFICIENCY, FRACTURES): Vit D, 25-Hydroxy: 41.1 ng/mL (ref 30.0–100.0)

## 2023-08-02 LAB — COMPREHENSIVE METABOLIC PANEL
ALT: 15 IU/L (ref 0–32)
AST: 23 IU/L (ref 0–40)
Albumin: 4.2 g/dL (ref 3.8–4.8)
Alkaline Phosphatase: 76 IU/L (ref 44–121)
BUN/Creatinine Ratio: 13 (ref 12–28)
BUN: 10 mg/dL (ref 8–27)
Bilirubin Total: 0.5 mg/dL (ref 0.0–1.2)
CO2: 25 mmol/L (ref 20–29)
Calcium: 9.6 mg/dL (ref 8.7–10.3)
Chloride: 104 mmol/L (ref 96–106)
Creatinine, Ser: 0.76 mg/dL (ref 0.57–1.00)
Globulin, Total: 2 g/dL (ref 1.5–4.5)
Glucose: 115 mg/dL — ABNORMAL HIGH (ref 70–99)
Potassium: 4.1 mmol/L (ref 3.5–5.2)
Sodium: 142 mmol/L (ref 134–144)
Total Protein: 6.2 g/dL (ref 6.0–8.5)
eGFR: 81 mL/min/{1.73_m2} (ref >59)

## 2023-08-02 LAB — BASIC METABOLIC PANEL
Anion gap: 9 (ref 5–15)
BUN: 11 mg/dL (ref 8–23)
CO2: 27 mmol/L (ref 22–32)
Calcium: 9.3 mg/dL (ref 8.9–10.3)
Chloride: 104 mmol/L (ref 98–111)
Creatinine, Ser: 0.72 mg/dL (ref 0.44–1.00)
GFR, Estimated: >60 mL/min (ref >60)
Glucose, Bld: 120 mg/dL — ABNORMAL HIGH (ref 70–99)
Potassium: 3.4 mmol/L — ABNORMAL LOW (ref 3.5–5.1)
Sodium: 140 mmol/L (ref 135–145)

## 2023-08-02 LAB — HEMOGLOBIN A1C
Est. average glucose Bld gHb Est-mCnc: 166 mg/dL
Hgb A1c MFr Bld: 7.4 % — ABNORMAL HIGH (ref 4.8–5.6)

## 2023-08-02 LAB — TROPONIN I (HIGH SENSITIVITY)
Troponin I (High Sensitivity): 7 ng/L (ref <18)
Troponin I (High Sensitivity): 6 ng/L (ref <18)

## 2023-08-02 LAB — TSH: TSH: 1.77 u[IU]/mL (ref 0.450–4.500)

## 2023-08-02 LAB — CBC
HCT: 37.4 % (ref 36.0–46.0)
Hemoglobin: 12 g/dL (ref 12.0–15.0)
MCH: 29.3 pg (ref 26.0–34.0)
MCHC: 32.1 g/dL (ref 30.0–36.0)
MCV: 91.4 fL (ref 80.0–100.0)
Platelets: 86 10*3/uL — ABNORMAL LOW (ref 150–400)
RBC: 4.09 MIL/uL (ref 3.87–5.11)
RDW: 12.8 % (ref 11.5–15.5)
WBC: 3.6 10*3/uL — ABNORMAL LOW (ref 4.0–10.5)
nRBC: 0 % (ref 0.0–0.2)

## 2023-08-02 LAB — LIPID PANEL WITH LDL/HDL RATIO
Cholesterol, Total: 111 mg/dL (ref 100–199)
HDL: 60 mg/dL (ref >39)
LDL Chol Calc (NIH): 38 mg/dL (ref 0–99)
LDL/HDL Ratio: 0.6 ratio (ref 0.0–3.2)
Triglycerides: 56 mg/dL (ref 0–149)
VLDL Cholesterol Cal: 13 mg/dL (ref 5–40)

## 2023-08-02 LAB — MICROALBUMIN / CREATININE URINE RATIO
Creatinine, Urine: 22.6 mg/dL
Microalb/Creat Ratio: 23 mg/g creat (ref 0–29)
Microalbumin, Urine: 5.1 ug/mL

## 2023-08-02 LAB — T4, FREE: Free T4: 1.45 ng/dL (ref 0.82–1.77)

## 2023-08-02 MED ORDER — MECLIZINE HCL 25 MG PO TABS: 25.0000 mg | ORAL_TABLET | Freq: Three times a day (TID) | ORAL | 0 refills | Status: DC | PRN

## 2023-08-02 NOTE — ED Notes (Signed)
Taking over care of pt at this time

## 2023-08-02 NOTE — ED Triage Notes (Signed)
Pt reports dizziness for a while.  Pt saw PMD yesterday and states today she is feeling worse.  No chest pain or sob.    Pt has nausea.  No vomiting.  Pt alert  speech clear.

## 2023-08-02 NOTE — Telephone Encounter (Signed)
Message from Peletier F sent at 08/02/2023  3:58 PM EDT  Summary: Dizziness, Nausea, and Sweating   Pt is calling in because she has had three dizzy spells today. Pt says one was so bad it felt like the car was turning upside down. Pt says along with the dizziness today she also experienced nausea as well as sweating. Pt says she is concerned.         Chief Complaint: dizziness Symptoms: severe nausea, sweating, vertigo Frequency: today  3 episodes Pertinent Negatives: Patient denies numbness to face arms or legs but as whole body weakness especially legs Disposition: [x] ED /[] Urgent Care (no appt availability in office) / [] Appointment(In office/virtual)/ []  [place] [facility]/ [] Home Care/ [] Refused Recommended Disposition /[]  Mobile Bus/ []  Follow-up with PCP Additional Notes: sister is taking her now.  Reason for Disposition  Patient sounds very sick or weak to the triager  Answer Assessment - Initial Assessment Questions 1. DESCRIPTION: "Describe your dizziness."     weaknesses 2. LIGHTHEADED: "Do you feel lightheaded?" (e.g., somewhat faint, woozy, weak upon standing)     yes 3. VERTIGO: "Do you feel like either you or the room is spinning or tilting?" (i.e. vertigo)     yes 4. SEVERITY: "How bad is it?"  "Do you feel like you are going to faint?" "Can you stand and walk?"   - MILD: Feels slightly dizzy, but walking normally.   - MODERATE: Feels unsteady when walking, but not falling; interferes with normal activities (e.g., school, work).   - SEVERE: Unable to walk without falling, or requires assistance to walk without falling; feels like passing out now.      None  5. ONSET:  "When did the dizziness begin?"     Today  6. AGGRAVATING FACTORS: "Does anything make it worse?" (e.g., standing, change in head position)     no 8. CAUSE: "What do you think is causing the dizziness?"     Does not know  10. OTHER SYMPTOMS: "Do you have any other symptoms?" (e.g.,  fever, chest pain, vomiting, diarrhea, bleeding)       Nausea, sweating, weakness whole body especially legs BS was 92 this am  Protocols used: Dizziness - Lightheadedness-A-AH, [employer]

## 2023-08-02 NOTE — Discharge Instructions (Addendum)
You may take the meclizine as needed if you have the spinning type of dizziness.  Make sure to drink plenty of fluids.  Take your normal medications as prescribed.  Follow-up with your primary care doctor.  We have also given you referral to ENT.  Return to the ER for new, worsening, or persistent severe dizziness or lightheadedness, headache, vision changes, weakness or numbness, difficulty breathing, or any other new or worsening symptoms that concern you.

## 2023-08-02 NOTE — ED Provider Notes (Signed)
[facility] Provider Note    Event Date/Time   First MD Initiated Contact with Patient 08/02/23 1831     (approximate)   History   Dizziness   HPI  Bethany [name] is a 77 y.o. female with a history of diabetes, hypertension, hyperlipidemia, HFrEF, and thyroid disease who presents with dizziness.  The patient states that she has had episodes of dizziness for several months, occurring intermittently, sometimes more than once per day, and lasting a few minutes.  These are described as feeling lightheaded.  However, today she had an episode like this that was worse than what she had been having previously.  It was associated with a sensation of everything spinning around her as well as nausea but no vomiting.  The patient denies any headache.  She has no chest pain or difficulty breathing.  She has no abdominal pain or diarrhea.  She has no fever or chills.  She states she is feeling back to baseline currently.  She denies any ear pain or hearing loss.  I reviewed the past medical records.  The patient was seen by Dr. Sherrie Mustache from family medicine for these symptoms yesterday.  She was plan for lab workup and possible TIA rule out with a CT.   Physical Exam   Triage Vital Signs: ED Triage Vitals  Encounter Vitals Group     BP 08/02/23 1656 123/68     Systolic BP Percentile --      Diastolic BP Percentile --      Pulse Rate 08/02/23 1656 78     Resp 08/02/23 1656 16     Temp 08/02/23 1656 98.3 F (36.8 C)     Temp Source 08/02/23 1656 Oral     SpO2 08/02/23 1656 98 %     Weight 08/02/23 1659 110 lb (49.9 kg)     Height 08/02/23 1659 5\' 1"  (1.549 m)     Head Circumference --      Peak Flow --      Pain Score 08/02/23 1659 0     Pain Loc --      Pain Education --      Exclude from Growth Chart --     Most recent vital signs: Vitals:   08/02/23 2230 08/02/23 2245  BP: 126/63   Pulse: 73   Resp:  16  Temp:    SpO2: 100%      General: Alert and  oriented, well-appearing, no distress.  CV:  Good peripheral perfusion. Resp:  Normal effort.  Abd:  No distention.  Other:  EOMI.  PERRLA.  No facial droop.  No photophobia.  Normal speech.  Motor and sensory intact in all extremities.  No pronator drift.  No ataxia on finger-to-nose.   ED Results / Procedures / Treatments   Labs (all labs ordered are listed, but only abnormal results are displayed) Labs Reviewed  BASIC METABOLIC PANEL - Abnormal; Notable for the following components:      Result Value   Potassium 3.4 (*)    Glucose, Bld 120 (*)    All other components within normal limits  CBC - Abnormal; Notable for the following components:   WBC 3.6 (*)    Platelets 86 (*)    All other components within normal limits  TROPONIN I (HIGH SENSITIVITY)  TROPONIN I (HIGH SENSITIVITY)     EKG  ED ECG REPORT I, Dionne Bucy, the attending physician, personally viewed and interpreted this ECG.  Date: 08/02/2023 EKG  Time: 1658 Rate: 73 Rhythm: Paced rhythm QRS Axis: normal Intervals: normal ST/T Wave abnormalities: normal Narrative Interpretation: no evidence of acute ischemia    RADIOLOGY  Chest x-ray: I independently viewed and interpreted the images; there is no focal consolidation or edema  CT head: No acute abnormality   PROCEDURES:  Critical Care performed: No  Procedures   MEDICATIONS ORDERED IN ED: Medications - No data to display   IMPRESSION / MDM / ASSESSMENT AND PLAN / ED COURSE  I reviewed the triage vital signs and the nursing notes.  77 year old female with PMH as noted above presents with subacute episodes of dizziness, with a more intense episode today that was associated with a spinning sensation and nausea.  Her symptoms are now resolved.  She saw her PMD for this and was plan for outpatient workup including a CT head as well as some labs.  Physical exam is unremarkable for acute findings.  Thorough neurologic exam is normal, during  extraocular movements the patient had some horizontal nystagmus and states that this elicited the dizziness slightly.  Differential diagnosis includes, but is not limited to, peripheral vertigo, dehydration, electrolyte abnormality, other metabolic etiology.  IV low suspicion for cardiac cause.  The patient has no signs or symptoms of an infection.  There is also a low suspicion for central vertigo given the episodic nature of the symptoms and the normal neurologic exam.  We will obtain basic labs, cardiac enzyme, CT head especially given that her primary care doctor had plan to do it as an outpatient anyway, and reassess.  Patient's presentation is most consistent with acute complicated illness / injury requiring diagnostic workup.  The patient is on the cardiac monitor to evaluate for evidence of arrhythmia and/or significant heart rate changes.  ----------------------------------------- 12:38 AM on 08/03/2023 -----------------------------------------  CT head is negative.  The patient remains asymptomatic.  She is stable for discharge home at this time.  I counseled her on the results of the workup and plan of care.  I have prescribed meclizine for her to try for her to these symptoms.  I have also referred her to ENT.  I gave strict return precautions and she expressed understanding.   FINAL CLINICAL IMPRESSION(S) / ED DIAGNOSES   Final diagnoses:  Dizziness  Vertigo     Rx / DC Orders   ED Discharge Orders          Ordered    meclizine (ANTIVERT) 25 MG tablet  3 times daily PRN        08/02/23 2231             Note:  This document was prepared using Dragon voice recognition software and may include unintentional dictation errors.    Dionne Bucy, MD 08/03/23 [phone]

## 2023-08-03 ENCOUNTER — Ambulatory Visit: Payer: Self-pay | Admitting: *Deleted

## 2023-08-03 NOTE — Telephone Encounter (Signed)
  Chief Complaint: question: Is there any medication reaction with meclizine and vaccines: flu, COVID, RSV  Disposition: [] ED /[] Urgent Care (no appt availability in office) / [] Appointment(In office/virtual)/ []  [place] [facility]/ [] Home Care/ [] Refused Recommended Disposition /[] [place] [facility]/ [x]  Follow-up with PCP Additional Notes: patient has been given information per CDC websire and what I could find listed in [employer]. Will forward question to provider for follow up.

## 2023-08-03 NOTE — Telephone Encounter (Signed)
Summary: vaccine questions   Pt called in with question, states, since she is taking med, for vertigo, is it ok to get the flu , covid and rsv shot and how apart should the shots be given.    Patient has newly prescribed medication- meclizine and is concerned about medication interaction.  Reason for Disposition . [1] Caller has medicine question about med NOT prescribed by PCP AND [2] triager unable to answer question (e.g., compatibility with other med, storage)  Answer Assessment - Initial Assessment Questions 1. NAME of MEDICINE: "What medicine(s) are you calling about?"     Patient has new medication and wants to know about interaction with vaccine 2. QUESTION: "What is your question?" (e.g., double dose of medicine, side effect)     New Rx Meclizine -has not started yet- any known interaction with vaccines 3. PRESCRIBER: "Who prescribed the medicine?" Reason: if prescribed by specialist, call should be referred to that group.     ED Per [employer]: no interaction between meclizine and flu vaccine- COVID and RSV not listed. Advised patient to start new medication before getting the vaccine- make sure she can be aware of ant SE first before adding in vaccine.  Also she gets her vaccine at pharmacy- excellent resource for medication interactions- please share concerns with pharmacist before vaccines.  Per CDC- no restriction about getting all 3 at same time or spreading them out( no wait period between) so she can do whatever she feels more comfortable doing with vaccines.  Protocols used: Medication Question Call-A-AH

## 2023-08-07 ENCOUNTER — Ambulatory Visit: Payer: Self-pay | Admitting: *Deleted

## 2023-08-07 NOTE — Telephone Encounter (Signed)
  Chief Complaint: chest pressure Symptoms: chest pressure- still having intermittent dizziness, GERD medication not helping Frequency: started yesterday Pertinent Negatives: Patient denies nausea, vomiting, sweating, fever, difficulty breathing, cough  Disposition: [x] ED /[] Urgent Care (no appt availability in office) / [] Appointment(In office/virtual)/ []  [place] [facility]/ [] Home Care/ [] Refused Recommended Disposition /[]  Mobile Bus/ []  Follow-up with PCP Additional Notes: Patient was seen recently and diagnosed with vertigo- she is calling to report chest pressure- she feels may be indigestion- but her GERD medication is not helping.  Patient states her dizziness has not completely cleared with meclizine and she is awaiting referral. Patient advised ED due to her symptoms and medical history- she is reluctant to go back.

## 2023-08-07 NOTE — Telephone Encounter (Signed)
Reason for Disposition  Dizziness or lightheadedness  Answer Assessment - Initial Assessment Questions 1. LOCATION: "Where does it hurt?"       Pressure in center of chest 2. RADIATION: "Does the pain go anywhere else?" (e.g., into neck, jaw, arms, back)     no 3. ONSET: "When did the chest pain begin?" (Minutes, hours or days)      Started yesterday 4. PATTERN: "Does the pain come and go, or has it been constant since it started?"  "Does it get worse with exertion?"      Constant, not worse with movement 5. DURATION: "How long does it last" (e.g., seconds, minutes, hours)     hours 6. SEVERITY: "How bad is the pain?"  (e.g., Scale 1-10; mild, moderate, or severe)    - MILD (1-3): doesn't interfere with normal activities     - MODERATE (4-7): interferes with normal activities or awakens from sleep    - SEVERE (8-10): excruciating pain, unable to do any normal activities       mild 7. CARDIAC RISK FACTORS: "Do you have any history of heart problems or risk factors for heart disease?" (e.g., angina, prior heart attack; diabetes, high blood pressure, high cholesterol, smoker, or strong family history of heart disease)     yes 8. PULMONARY RISK FACTORS: "Do you have any history of lung disease?"  (e.g., blood clots in lung, asthma, emphysema, birth control pills)     no 9. CAUSE: "What do you think is causing the chest pain?"     indigestion 10. OTHER SYMPTOMS: "Do you have any other symptoms?" (e.g., dizziness, nausea, vomiting, sweating, fever, difficulty breathing, cough)       Occasional dizziness  Protocols used: Chest Pain-A-AH

## 2023-08-08 ENCOUNTER — Ambulatory Visit (INDEPENDENT_AMBULATORY_CARE_PROVIDER_SITE_OTHER): Payer: Medicare HMO | Admitting: Physician Assistant

## 2023-08-08 ENCOUNTER — Encounter: Payer: Self-pay | Admitting: Physician Assistant

## 2023-08-08 VITALS — BP 111/70 | HR 86 | Ht 61.0 in | Wt 109.8 lb

## 2023-08-08 DIAGNOSIS — K219 Gastro-esophageal reflux disease without esophagitis: Secondary | ICD-10-CM

## 2023-08-08 DIAGNOSIS — R0789 Other chest pain: Secondary | ICD-10-CM | POA: Diagnosis not present

## 2023-08-08 DIAGNOSIS — R531 Weakness: Secondary | ICD-10-CM | POA: Diagnosis not present

## 2023-08-08 DIAGNOSIS — I5022 Chronic systolic (congestive) heart failure: Secondary | ICD-10-CM | POA: Diagnosis not present

## 2023-08-08 DIAGNOSIS — J302 Other seasonal allergic rhinitis: Secondary | ICD-10-CM | POA: Diagnosis not present

## 2023-08-08 DIAGNOSIS — R42 Dizziness and giddiness: Secondary | ICD-10-CM

## 2023-08-08 NOTE — Telephone Encounter (Signed)
Patient called back. She didn't go to ED as advised yesterday. Per patient chest pain is a little better than yesterday and would like to be seen.

## 2023-08-08 NOTE — Progress Notes (Unsigned)
Established patient visit  Patient: Bethany [name]   DOB: [birth year]   77 y.o. Female  MRN: [id number] Visit Date: 08/08/2023  Today's healthcare provider: Debera Lat, PA-C   Chief Complaint  Patient presents with   Medical Management of Chronic Issues    Chest pain has subsided since last night, started yesterday, possible indigestion    Subjective     Discussed the use of AI scribe software for clinical note transcription with the patient, who gave verbal consent to proceed.  History of Present Illness   The patient, with a history of congestive heart failure, presents with intermittent chest pain, described as 'something sitting on my chest.' The discomfort is centrally located and is associated with dysphagia, particularly with water. They also report chronic leg swelling and shortness of breath, which they attribute to their heart failure. These symptoms have not worsened recently. They are currently taking Lasix and Farxiga, which they believe are helpful.  The patient also reports dizziness and blurry vision. They have been prescribed meclizine for these symptoms. They also experience chronic leg weakness, which they describe as 'not new.'  Additionally, the patient reports occasional abdominal pain, located in the upper abdomen. They are currently taking pantoprazole once daily for gastroesophageal reflux disease (GERD).           08/08/2023    9:49 AM 12/20/2022    3:18 PM 11/25/2022    1:27 PM  Depression screen PHQ 2/9  Decreased Interest 0 0 0  Down, Depressed, Hopeless 0 0 0  PHQ - 2 Score 0 0 0  Altered sleeping 0 0 0  Tired, decreased energy 3 0 0  Change in appetite 0 0 0  Feeling bad or failure about yourself  0 0 0  Trouble concentrating 0 0 0  Moving slowly or fidgety/restless 1 0 0  Suicidal thoughts 0 0 0  PHQ-9 Score 4 0 0      08/08/2023    9:48 AM 07/12/2016   10:06 AM  GAD 7 : Generalized Anxiety Score  Nervous, Anxious, on Edge 0 0   Control/stop worrying 0 0  Worry too much - different things 1 1  Trouble relaxing 2 0  Restless 0 0  Easily annoyed or irritable 0 0  Afraid - awful might happen 0 0  Total GAD 7 Score 3 1  Anxiety Difficulty Not difficult at all Not difficult at all    Medications: Outpatient Medications Prior to Visit  Medication Sig   acetaminophen (TYLENOL) 500 MG tablet Take 500 mg by mouth at bedtime as needed.   Alcohol Swabs (DROPSAFE ALCOHOL PREP) 70 % PADS USE ONCE DAILY   aspirin EC 81 MG tablet Take 81 mg by mouth daily.   atorvastatin (LIPITOR) 20 MG tablet TAKE 1 TABLET EVERY DAY   Blood Glucose Calibration (TRUE METRIX LEVEL 1) Low SOLN Use as directed with glucose meter   carvedilol (COREG) 3.125 MG tablet TAKE 1 TABLET TWICE DAILY WITH MEALS (NEED MD APPOINTMENT)   dapagliflozin propanediol (FARXIGA) 10 MG TABS tablet Take 1 tablet (10 mg total) by mouth daily.   ferrous sulfate 325 (65 FE) MG tablet Take 325 mg by mouth 2 (two) times daily with a meal.   furosemide (LASIX) 40 MG tablet Take 1 tablet (40 mg) by mouth every other day   glucose blood (TRUE METRIX BLOOD GLUCOSE TEST) test strip CHECK BLOOD SUGAR EVERY DAY   levothyroxine (SYNTHROID) 50 MCG tablet Take 1 tablet (50 mcg total)  by mouth daily before breakfast. Please schedule office visit before any future refills.   meclizine (ANTIVERT) 25 MG tablet Take 1 tablet (25 mg total) by mouth 3 (three) times daily as needed.   Multiple Vitamin (MULTIVITAMIN) tablet Take 1 tablet by mouth daily.   pantoprazole (PROTONIX) 40 MG tablet TAKE 1 TABLET EVERY DAY   sacubitril-valsartan (ENTRESTO) 24-26 MG Take 0.5 tablets by mouth 2 (two) times daily.   senna (SENOKOT) 8.6 MG tablet Take 1 tablet by mouth daily as needed.   SitaGLIPtin-MetFORMIN HCl (JANUMET XR) [phone] MG TB24 TAKE 1 TABLET EVERY DAY   TRUEplus Lancets 33G MISC TEST BLOOD SUGAR ONE TIME DAILY   No facility-administered medications prior to visit.    Review of  Systems  All other systems reviewed and are negative.  Except see HPI   {Insert previous labs (optional):23779} {See past labs  Heme  Chem  Endocrine  Serology  Results Review (optional):1}   Objective    BP 111/70 (BP Location: Left Arm, Patient Position: Sitting, Cuff Size: Normal)   Pulse 86   Ht 5\' 1"  (1.549 m)   Wt 109 lb 12.8 oz (49.8 kg)   LMP  (LMP Unknown)   SpO2 99%   BMI 20.75 kg/m  {Insert last BP/Wt (optional):23777}{See vitals history (optional):1}   Physical Exam Vitals reviewed.  Constitutional:      General: She is not in acute distress.    Appearance: Normal appearance. She is well-developed. She is not diaphoretic.  HENT:     Head: Normocephalic and atraumatic.  Eyes:     General: No scleral icterus.    Conjunctiva/sclera: Conjunctivae normal.  Neck:     Thyroid: No thyromegaly.  Cardiovascular:     Rate and Rhythm: Normal rate and regular rhythm.     Pulses: Normal pulses.     Heart sounds: Murmur heard.  Pulmonary:     Effort: Pulmonary effort is normal. No respiratory distress.     Breath sounds: Normal breath sounds. No wheezing, rhonchi or rales.  Musculoskeletal:     Cervical back: Neck supple.     Right lower leg: No edema.     Left lower leg: No edema.  Lymphadenopathy:     Cervical: No cervical adenopathy.  Skin:    General: Skin is warm and dry.     Findings: No rash.  Neurological:     Mental Status: She is alert and oriented to person, place, and time. Mental status is at baseline.  Psychiatric:        Mood and Affect: Mood normal.        Behavior: Behavior normal.      No results found for any visits on 08/08/23.  Assessment & Plan        Congestive Heart Failure Mild leg swelling and shortness of breath, no change from baseline. Patient is compliant with Lasix and Farxiga. -Continue current management.  Chest Pain New onset, intermittent, central chest pain. No clear relation to exertion. EKG performed was  normal. -Order comprehensive blood work to rule out cardiac issues. -Consider further cardiac workup based on blood work results.  Gastroesophageal Reflux Disease (GERD) Patient reports chest discomfort with swallowing, possibly related to GERD. Currently on Protonix once daily. -Increase Protonix to twice daily temporarily. -Reevaluate symptoms after medication adjustment.  Generalized Weakness Chronic, intermittent weakness in legs and overall. No new changes. -Continue current management.  Dizziness Recent hospital visit for dizziness. Currently on Meclizine. -Continue Meclizine as prescribed. -Consider further workup  if symptoms persist.      No follow-ups on file.     The patient was advised to call back or seek an in-person evaluation if the symptoms worsen or if the condition fails to improve as anticipated.  I discussed the assessment and treatment plan with the patient. The patient was provided an opportunity to ask questions and all were answered. The patient agreed with the plan and demonstrated an understanding of the instructions.  I, Debera Lat, PA-C have reviewed all documentation for this visit. The documentation on  08/08/23  for the exam, diagnosis, procedures, and orders are all accurate and complete.  Debera Lat, [facility], MMS [facility] [phone] (phone) [phone] (fax)  [facility]

## 2023-08-08 NOTE — Telephone Encounter (Signed)
LMTCB- Checking on patient to see if she went to the ED

## 2023-08-09 LAB — CBC WITH DIFFERENTIAL/PLATELET
Basophils Absolute: 0 10*3/uL (ref 0.0–0.2)
Basos: 0 %
EOS (ABSOLUTE): 0 10*3/uL (ref 0.0–0.4)
Eos: 1 %
Hematocrit: 38.3 % (ref 34.0–46.6)
Hemoglobin: 12.3 g/dL (ref 11.1–15.9)
Immature Grans (Abs): 0 10*3/uL (ref 0.0–0.1)
Immature Granulocytes: 0 %
Lymphocytes Absolute: 1.2 10*3/uL (ref 0.7–3.1)
Lymphs: 33 %
MCH: 29.7 pg (ref 26.6–33.0)
MCHC: 32.1 g/dL (ref 31.5–35.7)
MCV: 93 fL (ref 79–97)
Monocytes Absolute: 0.3 10*3/uL (ref 0.1–0.9)
Monocytes: 9 %
Neutrophils Absolute: 2.1 10*3/uL (ref 1.4–7.0)
Neutrophils: 57 %
Platelets: 88 10*3/uL — CL (ref 150–450)
RBC: 4.14 x10E6/uL (ref 3.77–5.28)
RDW: 12.4 % (ref 11.7–15.4)
WBC: 3.7 10*3/uL (ref 3.4–10.8)

## 2023-08-09 LAB — COMPREHENSIVE METABOLIC PANEL
ALT: 12 [IU]/L (ref 0–32)
AST: 22 [IU]/L (ref 0–40)
Albumin: 4.3 g/dL (ref 3.8–4.8)
Alkaline Phosphatase: 78 [IU]/L (ref 44–121)
BUN/Creatinine Ratio: 16 (ref 12–28)
BUN: 14 mg/dL (ref 8–27)
Bilirubin Total: 0.4 mg/dL (ref 0.0–1.2)
CO2: 26 mmol/L (ref 20–29)
Calcium: 9.6 mg/dL (ref 8.7–10.3)
Chloride: 101 mmol/L (ref 96–106)
Creatinine, Ser: 0.87 mg/dL (ref 0.57–1.00)
Globulin, Total: 2.3 g/dL (ref 1.5–4.5)
Glucose: 130 mg/dL — ABNORMAL HIGH (ref 70–99)
Potassium: 3.6 mmol/L (ref 3.5–5.2)
Sodium: 140 mmol/L (ref 134–144)
Total Protein: 6.6 g/dL (ref 6.0–8.5)
eGFR: 69 mL/min/{1.73_m2} (ref >59)

## 2023-08-09 LAB — TROPONIN T: Troponin T (Highly Sensitive): 9 ng/L (ref 0–14)

## 2023-08-09 LAB — PRO B NATRIURETIC PEPTIDE: NT-Pro BNP: 73 pg/mL (ref 0–738)

## 2023-08-09 NOTE — Progress Notes (Signed)
Please, let pt know that her labs looks stable for her. However, if her symptoms worsen, she needs to go to ER

## 2023-08-11 ENCOUNTER — Other Ambulatory Visit: Payer: Self-pay | Admitting: Family Medicine

## 2023-08-11 DIAGNOSIS — E039 Hypothyroidism, unspecified: Secondary | ICD-10-CM

## 2023-08-11 NOTE — Telephone Encounter (Signed)
Requested Prescriptions  Pending Prescriptions Disp Refills   levothyroxine (SYNTHROID) 50 MCG tablet [Pharmacy Med Name: Synthroid Oral Tablet 50 MCG] 90 tablet 3    Sig: Take 1 tablet (50 mcg total) by mouth daily before breakfast.     Endocrinology:  Hypothyroid Agents Passed - 08/11/2023  5:57 AM      Passed - TSH in normal range and within 360 days    TSH  Date Value Ref Range Status  08/01/2023 1.770 0.450 - 4.500 uIU/mL Final         Passed - Valid encounter within last 12 months    Recent Outpatient Visits           3 days ago Chest pressure   [place] [facility] [place], [place], PA-C   1 week ago Type 2 diabetes mellitus with other specified complication, without long-term current use of insulin [facility])   [place] [facility] Malva Limes, MD   8 months ago Weakness   [facility] Alfredia Ferguson, PA-C   1 year ago Lower abdominal pain   [place] [facility] Malva Limes, MD   1 year ago Frequency of urination   [place] [facility] [place], [place], [place]

## 2023-08-14 ENCOUNTER — Encounter: Payer: Self-pay | Admitting: Oncology

## 2023-08-14 ENCOUNTER — Inpatient Hospital Stay: Payer: Medicare HMO | Attending: Oncology | Admitting: Oncology

## 2023-08-14 VITALS — BP 109/63 | HR 72 | Temp 96.0°F | Resp 18 | Wt 113.3 lb

## 2023-08-14 DIAGNOSIS — E785 Hyperlipidemia, unspecified: Secondary | ICD-10-CM | POA: Diagnosis not present

## 2023-08-14 DIAGNOSIS — E039 Hypothyroidism, unspecified: Secondary | ICD-10-CM | POA: Diagnosis not present

## 2023-08-14 DIAGNOSIS — Z7982 Long term (current) use of aspirin: Secondary | ICD-10-CM | POA: Insufficient documentation

## 2023-08-14 DIAGNOSIS — E119 Type 2 diabetes mellitus without complications: Secondary | ICD-10-CM | POA: Insufficient documentation

## 2023-08-14 DIAGNOSIS — I509 Heart failure, unspecified: Secondary | ICD-10-CM | POA: Insufficient documentation

## 2023-08-14 DIAGNOSIS — Z87891 Personal history of nicotine dependence: Secondary | ICD-10-CM | POA: Insufficient documentation

## 2023-08-14 DIAGNOSIS — Z7984 Long term (current) use of oral hypoglycemic drugs: Secondary | ICD-10-CM | POA: Diagnosis not present

## 2023-08-14 DIAGNOSIS — I420 Dilated cardiomyopathy: Secondary | ICD-10-CM | POA: Diagnosis not present

## 2023-08-14 DIAGNOSIS — D696 Thrombocytopenia, unspecified: Secondary | ICD-10-CM | POA: Diagnosis not present

## 2023-08-14 DIAGNOSIS — Z79899 Other long term (current) drug therapy: Secondary | ICD-10-CM | POA: Diagnosis not present

## 2023-08-14 DIAGNOSIS — R768 Other specified abnormal immunological findings in serum: Secondary | ICD-10-CM | POA: Diagnosis not present

## 2023-08-14 DIAGNOSIS — K219 Gastro-esophageal reflux disease without esophagitis: Secondary | ICD-10-CM | POA: Insufficient documentation

## 2023-08-14 DIAGNOSIS — Z79890 Hormone replacement therapy (postmenopausal): Secondary | ICD-10-CM | POA: Insufficient documentation

## 2023-08-14 DIAGNOSIS — M2550 Pain in unspecified joint: Secondary | ICD-10-CM | POA: Insufficient documentation

## 2023-08-14 DIAGNOSIS — E538 Deficiency of other specified B group vitamins: Secondary | ICD-10-CM | POA: Diagnosis not present

## 2023-08-14 NOTE — Assessment & Plan Note (Addendum)
Chronic thrombocytopenia, likely ITP  Previous work up includes normal spleen size on [place].  Adequate B12, folate,normal immature platelet fraction, no M protein on myeloma panel, normal light chain ratio, elevated LDH, negative hepatitis panel, negative HIV B cell clonality cannot be evaluated and no other significant immunophenotypic abnormality detected on  flow cytometry,  B cell arrangement assay showed A clonal B-cell population with an IgH rearrangement was detected. Discussed with pathology, this could be a pseudo clone.  Bone marrow biopsy showed no obvious explanation of thrombocytopenia.  Likely ITP, continue observation.

## 2023-08-14 NOTE — Progress Notes (Signed)
Hematology/Oncology Progress note [phone] [phone]        CHIEF COMPLAINTS/REASON FOR VISIT:  Evaluation of thrombocytopenia  ASSESSMENT & PLAN:   Thrombocytopenia (HCC) Chronic thrombocytopenia, likely ITP  Previous work up includes normal spleen size on [place].  Adequate B12, folate,normal immature platelet fraction, no M protein on myeloma panel, normal light chain ratio, elevated LDH, negative hepatitis panel, negative HIV B cell clonality cannot be evaluated and no other significant immunophenotypic abnormality detected on  flow cytometry,  B cell arrangement assay showed A clonal B-cell population with an IgH rearrangement was detected. Discussed with pathology, this could be a pseudo clone.  Bone marrow biopsy showed no obvious explanation of thrombocytopenia.  Likely ITP, continue observation.     Orders Placed This Encounter  Procedures   CMP (Cancer Center only)    Standing Status:   Future    Standing Expiration Date:   08/13/2024   CBC with Differential (Cancer Center Only)    Standing Status:   Future    Standing Expiration Date:   08/13/2024   Vitamin B12    Standing Status:   Future    Standing Expiration Date:   08/13/2024   Folate    Standing Status:   Future    Standing Expiration Date:   08/13/2024   Immature Platelet Fraction    Standing Status:   Future    Standing Expiration Date:   08/13/2024   Follow-up 6 months All questions were answered. The patient knows to call the clinic with any problems, questions or concerns.  Rickard Patience, MD, PhD [facility] Hematology Oncology 08/14/2023    HISTORY OF PRESENTING ILLNESS:  Bethany [name] is a 77 y.o. female who was seen in consultation at the request of Sherrie Mustache, Demetrios Isaacs, MD for evaluation of thrombocytopenia Patient has a long standing thrombocytopenia history since at least 2014. Her counts fluctuates, ranging from 90s to 120s.  Denies weight loss, fever, chills, fatigue, night  sweats.  Denies hematochezia, hematuria, hematemesis, epistaxis, black tarry stool.  Patient has no easy bruising.    Denies history hepatitis or HIV infection Denies history of chronic liver disease Denies routine alcohol consumption. Denies dietary restrictions.   INTERVAL HISTORY Bethany [name] is a 77 y.o. female who has above history reviewed by me today presents for follow up visit for thrombocytopenia She presents to discuss results.  No new complaints.  Occasional joint pain, denies any morning stiffness.    MEDICAL HISTORY:  Past Medical History:  Diagnosis Date   AICD (automatic cardioverter/defibrillator) present    Anemia    Anxiety    CHF (congestive heart failure) (HCC)    Complication of anesthesia    Depression    Diabetes (HCC)    Dilated cardiomyopathy (HCC)    Diverticulosis    Dyspnea    Dysrhythmia    Edema    FEET/ANKLES OCCAS   GERD (gastroesophageal reflux disease)    Hyperlipidemia    Hypothyroidism    LV dysfunction    Orthopnea    USES 3 PILLOWS   PONV (postoperative nausea and vomiting)    Reflux    Thrombocytopenia (HCC)    Thyroid disease    VHD (valvular heart disease)     SURGICAL HISTORY: Past Surgical History:  Procedure Laterality Date   ABDOMINAL HYSTERECTOMY     BI-VENTRICULAR IMPLANTABLE CARDIOVERTER DEFIBRILLATOR [name] 02/11/2015   Procedure: BI-VENTRICULAR IMPLANTABLE CARDIOVERTER DEFIBRILLATOR  (CRT-D);  Surgeon: Duke Salvia, MD;  Location: [facility] CATH  LAB;  Service: Cardiovascular;  Laterality: [name];   BONE MARROW BIOPSY  2007   spine   BREAST BIOPSY Right    neg   BREAST EXCISIONAL BIOPSY Right    CARDIAC CATHETERIZATION     CATARACT EXTRACTION W/PHACO Right 01/03/2017   Procedure: CATARACT EXTRACTION PHACO AND INTRAOCULAR LENS PLACEMENT (IOC);  Surgeon: Lockie Mola, MD;  Location: [facility] ORS;  Service: Ophthalmology;  Laterality: Right;  [place] 00:50 AP% 19.5 CDE 9.94 fluid pack lot # [id number]   CATARACT  EXTRACTION W/PHACO Left 10/27/2016   Procedure: CATARACT EXTRACTION PHACO AND INTRAOCULAR LENS PLACEMENT (IOC);  Surgeon: Lockie Mola, MD;  Location: [facility] ORS;  Service: Ophthalmology;  Laterality: Left;  [place]  01:12 AP%  21.0 CDE  11.59 Fluid pack lot # [id number]   CHOLECYSTECTOMY     ICD GENERATOR CHANGEOUT [name] 03/29/2022   Procedure: ICD GENERATOR CHANGEOUT;  Surgeon: Duke Salvia, MD;  Location: [facility] INVASIVE CV LAB;  Service: Cardiovascular;  Laterality: [name];   INSERT / REPLACE / REMOVE PACEMAKER     AICD   IR BONE MARROW BIOPSY & ASPIRATION  07/06/2023   LEAD REVISION [name] 02/11/2015   Procedure: LEAD REVISION;  Surgeon: Duke Salvia, MD;  Location: [facility] CATH LAB;  Service: Cardiovascular;  Laterality: [name];   PARTIAL HYSTERECTOMY  1977   TOOTH EXTRACTION  09/23/2015    SOCIAL HISTORY: Social History   Socioeconomic History   Marital status: Divorced    Spouse name: Not on file   Number of children: 4   Years of education: Not on file   Highest education level: GED or equivalent  Occupational History   Occupation: retired  Tobacco Use   Smoking status: Never   Smokeless tobacco: Former    Types: Snuff    Quit date: 10/27/1969  Vaping Use   Vaping status: Never Used  Substance and Sexual Activity   Alcohol use: No    Alcohol/week: 0.0 standard drinks of alcohol   Drug use: No   Sexual activity: Not on file  Other Topics Concern   Not on file  Social History Narrative   Not on file   Social Determinants of Health   Financial Resource Strain: Medium Risk (07/31/2023)   Overall Financial Resource Strain (CARDIA)    Difficulty of Paying Living Expenses: Somewhat hard  Food Insecurity: No Food Insecurity (07/31/2023)   Hunger Vital Sign    Worried About Running Out of Food in the Last Year: Never true    Ran Out of Food in the Last Year: Never true  Transportation Needs: No Transportation Needs (07/31/2023)   PRAPARE - Scientist, research (physical sciences) (Medical): No    Lack of Transportation (Non-Medical): No  Physical Activity: Insufficiently Active (07/31/2023)   Exercise Vital Sign    Days of Exercise per Week: 4 days    Minutes of Exercise per Session: 30 min  Stress: No Stress Concern Present (07/31/2023)   [employer] of Occupational Health - Occupational Stress Questionnaire    Feeling of Stress : Not at all  Social Connections: Moderately Integrated (07/31/2023)   Social Connection and Isolation Panel [NHANES]    Frequency of Communication with Friends and Family: More than three times a week    Frequency of Social Gatherings with Friends and Family: More than three times a week    Attends Religious Services: More than 4 times per year    Active Member of [employer] or Organizations: Yes    Attends [employer]  or Organization Meetings: More than 4 times per year    Marital Status: Divorced  Intimate Partner Violence: Not At Risk (12/20/2022)   Humiliation, Afraid, Rape, and Kick questionnaire    Fear of Current or Ex-Partner: No    Emotionally Abused: No    Physically Abused: No    Sexually Abused: No    FAMILY HISTORY: Family History  Problem Relation Age of Onset   Heart attack Mother    Prostate cancer Father    Ulcers Father    Heart disease Sister    Stroke Sister    Heart attack Sister    Diabetes Sister    Heart disease Sister    Diabetes Sister    Heart attack Sister    Diabetes Sister    Diabetes Sister    Diabetes Sister    Diabetes Brother    Heart attack Brother    Stroke Brother    Breast cancer Neg Hx     ALLERGIES:  is allergic to other and sulfa antibiotics.  MEDICATIONS:  Current Outpatient Medications  Medication Sig Dispense Refill   acetaminophen (TYLENOL) 500 MG tablet Take 500 mg by mouth at bedtime as needed.     Alcohol Swabs (DROPSAFE ALCOHOL PREP) 70 % PADS USE ONCE DAILY 100 each 3   aspirin EC 81 MG tablet Take 81 mg by mouth daily.     atorvastatin (LIPITOR) 20 MG  tablet TAKE 1 TABLET EVERY DAY 90 tablet 3   Blood Glucose Calibration (TRUE METRIX LEVEL 1) Low SOLN Use as directed with glucose meter 1 each 3   carvedilol (COREG) 3.125 MG tablet TAKE 1 TABLET TWICE DAILY WITH MEALS (NEED MD APPOINTMENT) 180 tablet 3   dapagliflozin propanediol (FARXIGA) 10 MG TABS tablet Take 1 tablet (10 mg total) by mouth daily. 90 tablet 3   ferrous sulfate 325 (65 FE) MG tablet Take 325 mg by mouth 2 (two) times daily with a meal.     furosemide (LASIX) 40 MG tablet Take 1 tablet (40 mg) by mouth every other day 30 tablet    glucose blood (TRUE METRIX BLOOD GLUCOSE TEST) test strip CHECK BLOOD SUGAR EVERY DAY 100 strip 3   levothyroxine (SYNTHROID) 50 MCG tablet Take 1 tablet (50 mcg total) by mouth daily before breakfast. 90 tablet 3   Multiple Vitamin (MULTIVITAMIN) tablet Take 1 tablet by mouth daily.     sacubitril-valsartan (ENTRESTO) 24-26 MG Take 0.5 tablets by mouth 2 (two) times daily.     senna (SENOKOT) 8.6 MG tablet Take 1 tablet by mouth daily as needed.     SitaGLIPtin-MetFORMIN HCl (JANUMET XR) [phone] MG TB24 TAKE 1 TABLET EVERY DAY 30 tablet 0   TRUEplus Lancets 33G MISC TEST BLOOD SUGAR ONE TIME DAILY 100 each 1   meclizine (ANTIVERT) 25 MG tablet Take 1 tablet (25 mg total) by mouth 3 (three) times daily as needed. (Patient not taking: Reported on 08/14/2023) 15 tablet 0   pantoprazole (PROTONIX) 40 MG tablet TAKE 1 TABLET EVERY DAY 90 tablet 4   No current facility-administered medications for this visit.    Review of Systems  Constitutional:  Negative for appetite change, chills, fatigue and fever.  HENT:   Negative for hearing loss and voice change.   Eyes:  Negative for eye problems.  Respiratory:  Negative for chest tightness and cough.   Cardiovascular:  Negative for chest pain.  Gastrointestinal:  Negative for abdominal distention, abdominal pain and blood in stool.  Endocrine: Negative  for hot flashes.  Genitourinary:  Negative for  difficulty urinating and frequency.   Musculoskeletal:  Negative for arthralgias.  Skin:  Negative for itching and rash.  Neurological:  Negative for extremity weakness.  Hematological:  Negative for adenopathy.  Psychiatric/Behavioral:  Negative for confusion.     PHYSICAL EXAMINATION: ECOG PERFORMANCE STATUS: 0 - Asymptomatic Vitals:   08/14/23 0946  BP: 109/63  Pulse: 72  Resp: 18  Temp: (!) 96 F (35.6 C)  SpO2: 100%   Filed Weights   08/14/23 0946  Weight: 113 lb 4.8 oz (51.4 kg)    Physical Exam Constitutional:      General: She is not in acute distress. HENT:     Head: Normocephalic and atraumatic.  Eyes:     General: No scleral icterus. Cardiovascular:     Rate and Rhythm: Normal rate and regular rhythm.     Heart sounds: Normal heart sounds.  Pulmonary:     Effort: Pulmonary effort is normal. No respiratory distress.     Breath sounds: No wheezing.  Abdominal:     General: Bowel sounds are normal. There is no distension.     Palpations: Abdomen is soft.  Musculoskeletal:        General: No deformity. Normal range of motion.     Cervical back: Normal range of motion and neck supple.  Skin:    General: Skin is warm and dry.     Findings: No erythema or rash.  Neurological:     Mental Status: She is alert and oriented to person, place, and time. Mental status is at baseline.     Cranial Nerves: No cranial nerve deficit.     Coordination: Coordination normal.  Psychiatric:        Mood and Affect: Mood normal.      LABORATORY DATA:  I have reviewed the data as listed    Latest Ref Rng & Units 08/08/2023   10:45 AM 08/02/2023    4:57 PM 07/06/2023    7:38 AM  CBC  WBC 3.4 - 10.8 x10E3/uL 3.7  3.6  3.6   Hemoglobin 11.1 - 15.9 g/dL 65.7  84.6  96.2   Hematocrit 34.0 - 46.6 % 38.3  37.4  38.5   Platelets 150 - 450 x10E3/uL 88  86  87       Latest Ref Rng & Units 08/08/2023   10:45 AM 08/02/2023    4:57 PM 08/01/2023    8:35 AM  CMP  Glucose 70 -  99 mg/dL 952  841  324   BUN 8 - 27 mg/dL 14  11  10    Creatinine 0.57 - 1.00 mg/dL 4.01  0.27  2.53   Sodium 134 - 144 mmol/L 140  140  142   Potassium 3.5 - 5.2 mmol/L 3.6  3.4  4.1   Chloride 96 - 106 mmol/L 101  104  104   CO2 20 - 29 mmol/L 26  27  25    Calcium 8.7 - 10.3 mg/dL 9.6  9.3  9.6   Total Protein 6.0 - 8.5 g/dL 6.6   6.2   Total Bilirubin 0.0 - 1.2 mg/dL 0.4   0.5   Alkaline Phos 44 - 121 IU/L 78   76   AST 0 - 40 IU/L 22   23   ALT 0 - 32 IU/L 12   15      RADIOGRAPHIC STUDIES: I have personally reviewed the radiological images as listed and agreed with the findings  in the report.  CT Head Wo Contrast  Result Date: 08/02/2023 CLINICAL DATA:  Syncope/presyncope. EXAM: CT HEAD WITHOUT CONTRAST TECHNIQUE: Contiguous axial images were obtained from the base of the skull through the vertex without intravenous contrast. RADIATION DOSE REDUCTION: This exam was performed according to the departmental dose-optimization program which includes automated exposure control, adjustment of the mA and/or kV according to patient size and/or use of iterative reconstruction technique. COMPARISON:  Head CT 08/23/2018. FINDINGS: Brain: No evidence of acute infarction, hemorrhage, hydrocephalus, extra-axial collection or mass lesion/mass effect. Vascular: No hyperdense vessel or unexpected calcification. Skull: Normal. Negative for fracture or focal lesion. Sinuses/Orbits: No acute finding. Other: None. IMPRESSION: No acute intracranial abnormality. Electronically Signed   By: Darliss Cheney M.D.   On: 08/02/2023 21:02   DG Chest 2 View  Result Date: 08/02/2023 CLINICAL DATA:  Dizziness, sweating, nausea EXAM: CHEST - 2 VIEW COMPARISON:  09/02/2022 FINDINGS: Stable enlargement of the cardiomediastinal silhouette. Left chest wall CRT-D. No focal consolidation, pleural effusion, or pneumothorax. No displaced rib fractures. IMPRESSION: No acute cardiopulmonary disease. Electronically Signed   By: Minerva Fester M.D.   On: 08/02/2023 19:12   IR BONE MARROW BIOPSY & ASPIRATION  Result Date: 07/06/2023 INDICATION: Thrombocytopenia EXAM: Bone marrow aspiration and core biopsy using fluoroscopic guidance MEDICATIONS: None. ANESTHESIA/SEDATION: Moderate (conscious) sedation was employed during this procedure. A total of Versed 0.5 mg and Fentanyl 50 mcg was administered intravenously. Moderate Sedation Time: 10 minutes. The patient's level of consciousness and vital signs were monitored continuously by radiology nursing throughout the procedure under my direct supervision. FLUOROSCOPY TIME:  Fluoroscopy Time: 0.6 minutes (3 mGy) COMPLICATIONS: None immediate. PROCEDURE: Informed written consent was obtained from the patient after a thorough discussion of the procedural risks, benefits and alternatives. All questions were addressed. Maximal Sterile Barrier Technique was utilized including caps, mask, sterile gowns, sterile gloves, sterile drape, hand hygiene and skin antiseptic. A timeout was performed prior to the initiation of the procedure. The patient was placed prone on the exam table. Limited fluoroscopy of the pelvis was performed for planning purposes. Skin entry site was marked, and the overlying skin was prepped and draped in the standard sterile fashion. Local analgesia was obtained with 1% lidocaine. Using fluoroscopic guidance, an 11 gauge needle was advanced just deep to the cortex of the right posterior ilium. Subsequently, bone marrow aspiration and core biopsy were performed. Specimens were submitted to lab/pathology for handling. Hemostasis was achieved with manual pressure, and a clean dressing was placed. The patient tolerated the procedure well without immediate complication. IMPRESSION: Successful bone marrow aspiration and core biopsy of the right posterior ilium using fluoroscopic guidance. Electronically Signed   By: Olive Bass M.D.   On: 07/06/2023 13:21   CUP PACEART REMOTE DEVICE  CHECK  Result Date: 06/27/2023 Scheduled remote reviewed. Normal device function.  Within the monitoring period, HF diagnostics have been normal.  Next remote 91 days. - CS, CVRS

## 2023-08-16 ENCOUNTER — Ambulatory Visit: Payer: Medicare HMO | Admitting: Oncology

## 2023-08-21 ENCOUNTER — Other Ambulatory Visit: Payer: Self-pay | Admitting: Family Medicine

## 2023-08-21 NOTE — Telephone Encounter (Signed)
Requested Prescriptions  Pending Prescriptions Disp Refills   SitaGLIPtin-MetFORMIN HCl (JANUMET XR) [phone] MG TB24 [Pharmacy Med Name: Janumet XR Oral Tablet Extended Release 24 Hour [phone] MG] 90 tablet 1    Sig: TAKE 1 TABLET EVERY DAY (NEED MD APPOINTMENT FOR REFILLS)     Endocrinology:  Diabetes - Biguanide + DPP-4 Inhibitor Combos Failed - 08/21/2023  2:07 AM      Failed - B12 Level in normal range and within 720 days    Vitamin B-12  Date Value Ref Range Status  12/01/2022 1,268 (H) 180 - 914 pg/mL Final    Comment:    (NOTE) This assay is not validated for testing neonatal or myeloproliferative syndrome specimens for Vitamin B12 levels. Performed at [facility], [address] [place], [place] [address]          Failed - CBC within normal limits and completed in the last 12 months    WBC  Date Value Ref Range Status  08/08/2023 3.7 3.4 - 10.8 x10E3/uL Final  08/02/2023 3.6 (L) 4.0 - 10.5 K/uL Final   RBC  Date Value Ref Range Status  08/08/2023 4.14 3.77 - 5.28 x10E6/uL Final  08/02/2023 4.09 3.87 - 5.11 MIL/uL Final   Hemoglobin  Date Value Ref Range Status  08/08/2023 12.3 11.1 - 15.9 g/dL Final   Hematocrit  Date Value Ref Range Status  08/08/2023 38.3 34.0 - 46.6 % Final   MCHC  Date Value Ref Range Status  08/08/2023 32.1 31.5 - 35.7 g/dL Final  96/29/5284 13.2 30.0 - 36.0 g/dL Final   [facility]  Date Value Ref Range Status  08/08/2023 29.7 26.6 - 33.0 pg Final  08/02/2023 29.3 26.0 - 34.0 pg Final   MCV  Date Value Ref Range Status  08/08/2023 93 79 - 97 fL Final  02/04/2015 92 80 - 100 fL Final   No results found for: "PLTCOUNTKUC", "LABPLAT", "POCPLA" RDW  Date Value Ref Range Status  08/08/2023 12.4 11.7 - 15.4 % Final  02/04/2015 13.2 11.5 - 14.5 % Final         Passed - HBA1C is between 0 and 7.9 and within 180 days    Hemoglobin A1C  Date Value Ref Range Status  02/20/2014 6.3 4.2 - 6.3 % Final    Comment:    The American  Diabetes Association recommends that a primary goal of therapy should be <7% and that physicians should reevaluate the treatment regimen in patients with HbA1c values consistently >8%.    Hgb A1c MFr Bld  Date Value Ref Range Status  08/01/2023 7.4 (H) 4.8 - 5.6 % Final    Comment:             Prediabetes: 5.7 - 6.4          Diabetes: >6.4          Glycemic control for adults with diabetes: <7.0          Passed - Cr in normal range and within 360 days    Creatinine  Date Value Ref Range Status  06/30/2023 0.79 0.44 - 1.00 mg/dL Final  44/11/270 5.36 mg/dL Final    Comment:    6.44-0.34 NOTE: New Reference Range  01/13/15    Creatinine, Ser  Date Value Ref Range Status  08/08/2023 0.87 0.57 - 1.00 mg/dL Final   Creatinine, POC  Date Value Ref Range Status  12/04/2018 n/a mg/dL Final         Passed - eGFR in  normal range and within 360 days    EGFR (African American)  Date Value Ref Range Status  02/04/2015 >60  Final   GFR calc Af Amer  Date Value Ref Range Status  07/01/2020 79 >59 mL/min/1.73 Final    Comment:    **[facility] currently reports eGFR in compliance with the current**   recommendations of the [employer]. [facility] will   update reporting as new guidelines are published from the NKF-ASN   Task force.    EGFR (Non-African Amer.)  Date Value Ref Range Status  02/04/2015 >60  Final    Comment:    eGFR values <66mL/min/1.73 m2 may be an indication of chronic kidney disease (CKD). Calculated eGFR is useful in patients with stable renal function. The eGFR calculation will not be reliable in acutely ill patients when serum creatinine is changing rapidly. It is not useful in patients on dialysis. The eGFR calculation may not be applicable to patients at the low and high extremes of body sizes, pregnant women, and vegetarians.    GFR, Estimated  Date Value Ref Range Status  08/02/2023 >60 >60 mL/min Final    Comment:     (NOTE) Calculated using the CKD-EPI Creatinine Equation (2021)   06/30/2023 >60 >60 mL/min Final    Comment:    (NOTE) Calculated using the CKD-EPI Creatinine Equation (2021)    GFR  Date Value Ref Range Status  02/05/2015 88.96 >60.00 mL/min Final   eGFR  Date Value Ref Range Status  08/08/2023 69 >59 mL/min/1.73 Final         Passed - Valid encounter within last 6 months    Recent Outpatient Visits           1 week ago Chest pressure   [place] [place], [place], PA-C   2 weeks ago Type 2 diabetes mellitus with other specified complication, without long-term current use of insulin [facility]   [facility] Malva Limes, MD   8 months ago Weakness   [facility] Alfredia Ferguson, PA-C   1 year ago Lower abdominal pain   [place] [facility] Malva Limes, MD   1 year ago Frequency of urination   [place] [facility] [place], [place], [place]

## 2023-08-28 DIAGNOSIS — H31012 Macula scars of posterior pole (postinflammatory) (post-traumatic), left eye: Secondary | ICD-10-CM | POA: Diagnosis not present

## 2023-08-28 DIAGNOSIS — Z0100 Encounter for examination of eyes and vision without abnormal findings: Secondary | ICD-10-CM | POA: Diagnosis not present

## 2023-08-28 DIAGNOSIS — H26492 Other secondary cataract, left eye: Secondary | ICD-10-CM | POA: Diagnosis not present

## 2023-08-28 DIAGNOSIS — E119 Type 2 diabetes mellitus without complications: Secondary | ICD-10-CM | POA: Diagnosis not present

## 2023-08-28 DIAGNOSIS — H35073 Retinal telangiectasis, bilateral: Secondary | ICD-10-CM | POA: Diagnosis not present

## 2023-08-28 LAB — HM DIABETES EYE EXAM

## 2023-08-30 ENCOUNTER — Other Ambulatory Visit: Payer: Self-pay | Admitting: Student

## 2023-08-30 DIAGNOSIS — R42 Dizziness and giddiness: Secondary | ICD-10-CM | POA: Diagnosis not present

## 2023-08-30 DIAGNOSIS — H903 Sensorineural hearing loss, bilateral: Secondary | ICD-10-CM | POA: Diagnosis not present

## 2023-08-31 ENCOUNTER — Other Ambulatory Visit (HOSPITAL_COMMUNITY): Payer: Self-pay | Admitting: Student

## 2023-08-31 ENCOUNTER — Encounter: Payer: Self-pay | Admitting: Family Medicine

## 2023-08-31 DIAGNOSIS — R42 Dizziness and giddiness: Secondary | ICD-10-CM

## 2023-09-01 DIAGNOSIS — D696 Thrombocytopenia, unspecified: Secondary | ICD-10-CM | POA: Diagnosis not present

## 2023-09-01 DIAGNOSIS — R42 Dizziness and giddiness: Secondary | ICD-10-CM | POA: Diagnosis not present

## 2023-09-01 DIAGNOSIS — I10 Essential (primary) hypertension: Secondary | ICD-10-CM | POA: Diagnosis not present

## 2023-09-01 DIAGNOSIS — I5022 Chronic systolic (congestive) heart failure: Secondary | ICD-10-CM | POA: Diagnosis not present

## 2023-09-01 DIAGNOSIS — I447 Left bundle-branch block, unspecified: Secondary | ICD-10-CM | POA: Diagnosis not present

## 2023-09-01 DIAGNOSIS — Z95810 Presence of automatic (implantable) cardiac defibrillator: Secondary | ICD-10-CM | POA: Diagnosis not present

## 2023-09-01 DIAGNOSIS — E782 Mixed hyperlipidemia: Secondary | ICD-10-CM | POA: Diagnosis not present

## 2023-09-05 ENCOUNTER — Telehealth: Payer: Self-pay | Admitting: *Deleted

## 2023-09-05 NOTE — Telephone Encounter (Signed)
Transition Care Management Unsuccessful Follow-up Telephone Call  Date of discharge and from where:  [facility]  08/02/2023  Attempts:  2nd Attempt  Reason for unsuccessful TCM follow-up call: Declined to participate

## 2023-09-05 NOTE — Telephone Encounter (Signed)
Transition Care Management Unsuccessful Follow-up Telephone Call  Date of discharge and from where:  [facility]  07/30/2023  Attempts:  1st Attempt  Reason for unsuccessful TCM follow-up call:  Left voice message

## 2023-09-08 ENCOUNTER — Other Ambulatory Visit: Payer: Self-pay | Admitting: Family Medicine

## 2023-09-08 DIAGNOSIS — E119 Type 2 diabetes mellitus without complications: Secondary | ICD-10-CM

## 2023-09-15 ENCOUNTER — Other Ambulatory Visit: Payer: Medicare HMO | Admitting: Pharmacist

## 2023-09-15 DIAGNOSIS — E119 Type 2 diabetes mellitus without complications: Secondary | ICD-10-CM

## 2023-09-15 DIAGNOSIS — I5022 Chronic systolic (congestive) heart failure: Secondary | ICD-10-CM

## 2023-09-15 MED ORDER — DAPAGLIFLOZIN PROPANEDIOL 10 MG PO TABS
10.0000 mg | ORAL_TABLET | Freq: Every day | ORAL | 3 refills | Status: DC
Start: 2023-09-15 — End: 2024-03-25

## 2023-09-15 NOTE — Progress Notes (Signed)
   09/15/2023  Patient ID: Leanora Cover, female   DOB: 13-Aug-1946, 77 y.o.   MRN: [id number]  Spoke with the patient on the phone today. Confirmed she has completed the [place] re-en[place] process for 2025 through the Digital Assistant link texted to her. To finish application completion, will send Farxiga refill to [facility] today.   As for dizziness, she stopped the aspirin and Carvedilol as recommended by Cardiology. Continues to have dizziness. Also received her flu and COVID shot earlier this week, so that could be contributing. Has follow-up scheduled on 11/19 with Cardiology to assess further. Med list updated and no further concerns at this time.    Marlowe Aschoff, PharmD [facility] Phone Number: [phone]

## 2023-09-24 ENCOUNTER — Other Ambulatory Visit: Payer: Self-pay | Admitting: Family Medicine

## 2023-09-26 ENCOUNTER — Ambulatory Visit (INDEPENDENT_AMBULATORY_CARE_PROVIDER_SITE_OTHER): Payer: Medicare HMO

## 2023-09-26 DIAGNOSIS — I428 Other cardiomyopathies: Secondary | ICD-10-CM

## 2023-09-26 DIAGNOSIS — I5022 Chronic systolic (congestive) heart failure: Secondary | ICD-10-CM

## 2023-09-26 LAB — CUP PACEART REMOTE DEVICE CHECK
Battery Remaining Longevity: 71 mo
Battery Voltage: 2.99 V
Brady Statistic AP VP Percent: 0.04 %
Brady Statistic AP VS Percent: 0.01 %
Brady Statistic AS VP Percent: 98.5 %
Brady Statistic AS VS Percent: 1.45 %
Brady Statistic RA Percent Paced: 0.05 %
Brady Statistic RV Percent Paced: 7.59 %
Date Time Interrogation Session: 20241119033628
HighPow Impedance: 50 Ohm
Implantable Lead Connection Status: 753985
Implantable Lead Connection Status: 753985
Implantable Lead Connection Status: 753985
Implantable Lead Implant Date: 20160409
Implantable Lead Implant Date: 20160409
Implantable Lead Implant Date: 20160409
Implantable Lead Location: 753858
Implantable Lead Location: 753859
Implantable Lead Location: 753860
Implantable Lead Model: 4598
Implantable Lead Model: 5076
Implantable Pulse Generator Implant Date: 20230523
Lead Channel Impedance Value: 1007 Ohm
Lead Channel Impedance Value: 1121 Ohm
Lead Channel Impedance Value: 1178 Ohm
Lead Channel Impedance Value: 1197 Ohm
Lead Channel Impedance Value: 274.19 Ohm
Lead Channel Impedance Value: 278.237
Lead Channel Impedance Value: 299.908
Lead Channel Impedance Value: 324.377
Lead Channel Impedance Value: 330.057
Lead Channel Impedance Value: 361 Ohm
Lead Channel Impedance Value: 361 Ohm
Lead Channel Impedance Value: 475 Ohm
Lead Channel Impedance Value: 513 Ohm
Lead Channel Impedance Value: 589 Ohm
Lead Channel Impedance Value: 608 Ohm
Lead Channel Impedance Value: 722 Ohm
Lead Channel Impedance Value: 950 Ohm
Lead Channel Impedance Value: 988 Ohm
Lead Channel Pacing Threshold Amplitude: 0.375 V
Lead Channel Pacing Threshold Amplitude: 0.75 V
Lead Channel Pacing Threshold Amplitude: 2 V
Lead Channel Pacing Threshold Pulse Width: 0.4 ms
Lead Channel Pacing Threshold Pulse Width: 0.4 ms
Lead Channel Pacing Threshold Pulse Width: 1 ms
Lead Channel Sensing Intrinsic Amplitude: 0.875 mV
Lead Channel Sensing Intrinsic Amplitude: 0.875 mV
Lead Channel Sensing Intrinsic Amplitude: 9 mV
Lead Channel Sensing Intrinsic Amplitude: 9 mV
Lead Channel Setting Pacing Amplitude: 1.5 V
Lead Channel Setting Pacing Amplitude: 2 V
Lead Channel Setting Pacing Amplitude: 2.5 V
Lead Channel Setting Pacing Pulse Width: 0.4 ms
Lead Channel Setting Pacing Pulse Width: 1 ms
Lead Channel Setting Sensing Sensitivity: 0.3 mV
Zone Setting Status: 755011

## 2023-10-02 DIAGNOSIS — R42 Dizziness and giddiness: Secondary | ICD-10-CM | POA: Diagnosis not present

## 2023-10-02 DIAGNOSIS — Z95810 Presence of automatic (implantable) cardiac defibrillator: Secondary | ICD-10-CM | POA: Diagnosis not present

## 2023-10-02 DIAGNOSIS — I10 Essential (primary) hypertension: Secondary | ICD-10-CM | POA: Diagnosis not present

## 2023-10-02 DIAGNOSIS — D696 Thrombocytopenia, unspecified: Secondary | ICD-10-CM | POA: Diagnosis not present

## 2023-10-02 DIAGNOSIS — I5022 Chronic systolic (congestive) heart failure: Secondary | ICD-10-CM | POA: Diagnosis not present

## 2023-10-02 DIAGNOSIS — E782 Mixed hyperlipidemia: Secondary | ICD-10-CM | POA: Diagnosis not present

## 2023-10-02 DIAGNOSIS — I447 Left bundle-branch block, unspecified: Secondary | ICD-10-CM | POA: Diagnosis not present

## 2023-10-12 DIAGNOSIS — Z0100 Encounter for examination of eyes and vision without abnormal findings: Secondary | ICD-10-CM | POA: Diagnosis not present

## 2023-10-17 ENCOUNTER — Ambulatory Visit (HOSPITAL_COMMUNITY)
Admission: RE | Admit: 2023-10-17 | Discharge: 2023-10-17 | Disposition: A | Discharge status: Home / Self Care | Payer: Medicare HMO | Source: Ambulatory Visit | Attending: Student | Admitting: Student

## 2023-10-17 DIAGNOSIS — R42 Dizziness and giddiness: Secondary | ICD-10-CM | POA: Insufficient documentation

## 2023-10-17 MED ORDER — GADOBUTROL 1 MMOL/ML IV SOLN
5.0000 mL | Freq: Once | INTRAVENOUS | Status: AC | PRN
  Administered 2023-10-17: 5 mL via INTRAVENOUS

## 2023-10-23 NOTE — Progress Notes (Signed)
Remote ICD transmission.   

## 2023-11-03 ENCOUNTER — Other Ambulatory Visit: Payer: Self-pay | Admitting: Family Medicine

## 2023-11-03 DIAGNOSIS — E119 Type 2 diabetes mellitus without complications: Secondary | ICD-10-CM

## 2023-12-07 ENCOUNTER — Other Ambulatory Visit: Payer: Self-pay | Admitting: Family Medicine

## 2023-12-07 DIAGNOSIS — E119 Type 2 diabetes mellitus without complications: Secondary | ICD-10-CM

## 2023-12-08 NOTE — Telephone Encounter (Signed)
Requested medication (s) are due for refill today - no  Requested medication (s) are on the active medication list -yes  Future visit scheduled -no  Last refill: 02/22/23 #100 3RF  Notes to clinic: off protocol- provider review   Requested Prescriptions  Pending Prescriptions Disp Refills   Alcohol Swabs (DROPSAFE ALCOHOL PREP) 70 % PADS [Pharmacy Med Name: DropSafe Alcohol Prep Pad 70 %] 100 each 3    Sig: USE ONE TIME DAILY     Off-Protocol Failed - 12/08/2023  9:11 AM      Failed - Medication not assigned to a protocol, review manually.      Passed - Valid encounter within last 12 months    Recent Outpatient Visits           4 months ago Chest pressure   [place] [facility] Duran, [place], PA-C   4 months ago Type 2 diabetes mellitus with other specified complication, without long-term current use of insulin [facility])   [place] [facility] Malva Limes, MD   1 year ago Weakness   [facility] Alfredia Ferguson, PA-C   1 year ago Lower abdominal pain   [place] [facility] Malva Limes, MD   1 year ago Frequency of urination   [place] [facility] [place], [place], [place]                 Requested Prescriptions  Pending Prescriptions Disp Refills   Alcohol Swabs (DROPSAFE ALCOHOL PREP) 70 % PADS [Pharmacy Med Name: DropSafe Alcohol Prep Pad 70 %] 100 each 3    Sig: USE ONE TIME DAILY     Off-Protocol Failed - 12/08/2023  9:11 AM      Failed - Medication not assigned to a protocol, review manually.      Passed - Valid encounter within last 12 months    Recent Outpatient Visits           4 months ago Chest pressure   [place] [facility] B[place], [place], PA-C   4 months ago Type 2 diabetes mellitus with other specified complication, without long-term current use of insulin [facility])   [place] [facility] Malva Limes, MD   1  year ago Weakness   [place] [facility] Alfredia Ferguson, PA-C   1 year ago Lower abdominal pain   [facility] Malva Limes, MD   1 year ago Frequency of urination    [facility] [place], [place], [place]

## 2023-12-14 ENCOUNTER — Encounter: Payer: Self-pay | Admitting: Emergency Medicine

## 2023-12-14 ENCOUNTER — Ambulatory Visit
Admission: EM | Admit: 2023-12-14 | Discharge: 2023-12-14 | Disposition: A | Discharge status: Home / Self Care | Payer: Medicare PPO | Attending: Emergency Medicine | Admitting: Emergency Medicine

## 2023-12-14 DIAGNOSIS — M545 Low back pain, unspecified: Secondary | ICD-10-CM

## 2023-12-14 DIAGNOSIS — R079 Chest pain, unspecified: Secondary | ICD-10-CM

## 2023-12-14 LAB — LAB REPORT - SCANNED: EGFR: 90

## 2023-12-14 NOTE — Discharge Instructions (Signed)
 Go immediately to the ER for further evaluation of intermittent chest pain

## 2023-12-14 NOTE — ED Provider Notes (Signed)
 [facility]    CSN: [id number] Arrival date & time: 12/14/23  0847      History   Chief Complaint Chief Complaint  Patient presents with   Chest Pain   Back Pain    HPI MERRIL ISAKSON is a 78 y.o. female.   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  Patient currently denies any chest pain, no palpitations, or shortness of breath.  Patient is also complaining of low back pain for 2 weeks denies any trauma or fall , denies dysuria or abdominal pain or fever at this time.  PMH: CHF,AICD, HTN, diabetes, dilated cardiomyopathy, hypothyroidism, hyperlipidemia, thrombocytopenia  The history is provided by the patient. No language interpreter was used.    Past Medical History:  Diagnosis Date   AICD (automatic cardioverter/defibrillator) present    Anemia    Anxiety    CHF (congestive heart failure) (HCC)    Complication of anesthesia    Depression    Diabetes (HCC)    Dilated cardiomyopathy (HCC)    Diverticulosis    Dyspnea    Dysrhythmia    Edema    FEET/ANKLES OCCAS   GERD (gastroesophageal reflux disease)    Hyperlipidemia    Hypothyroidism    LV dysfunction    Orthopnea    USES 3 PILLOWS   PONV (postoperative nausea and vomiting)    Reflux    Thrombocytopenia (HCC)    Thyroid  disease    VHD (valvular heart disease)     Patient Active Problem List   Diagnosis Date Noted   Heart failure with improved ejection fraction (HFimpEF) (HCC) 03/12/2023   Positive ANA (antinuclear antibody) 12/30/2022   Acute pain of right shoulder 11/28/2022   Hospital discharge follow-up 11/29/2021   Acute right-sided low back pain without sciatica 11/29/2021   Excessive daytime sleepiness 02/18/2021   Cardiac resynchronization therapy defibrillator (CRT-D) in place 02/18/2021   Thrombocytopenia (HCC) 06/11/2019   Pain due to onychomycosis of toenail of right foot 05/30/2019    Memory loss 07/12/2016   GERD (gastroesophageal reflux disease) 08/24/2015   Automatic implantable cardioverter-defibrillator in situ 07/29/2015   Hypotension 06/18/2015   Absolute anemia 03/11/2015   Abnormal liver enzymes 03/11/2015   Weakness 03/11/2015   Decreased potassium in the blood 03/11/2015   Hypercholesteremia 03/11/2015   Adult hypothyroidism 03/11/2015   Idiopathic insomnia 03/11/2015   OP (osteoporosis) 03/11/2015   Diabetes mellitus, type 2 (HCC) 03/11/2015   Vitamin D  deficiency 03/11/2015   LBBB (left bundle branch block) 02/11/2015   Chest pain 01/22/2015   Chronic systolic heart failure (HCC) 09/25/2014   Heart valve disease 04/10/2014    Past Surgical History:  Procedure Laterality Date   ABDOMINAL HYSTERECTOMY     BI-VENTRICULAR IMPLANTABLE CARDIOVERTER DEFIBRILLATOR [name] 02/11/2015   Procedure: BI-VENTRICULAR IMPLANTABLE CARDIOVERTER DEFIBRILLATOR  (CRT-D);  Surgeon: Elspeth JAYSON Sage, MD;  Location: [facility] C[facility] LAB;  Service: Cardiovascular;  Laterality: [name];   BONE MARROW BIOPSY  2007   spine   BREAST BIOPSY Right    neg   BREAST EXCISIONAL BIOPSY Right    CARDIAC CATHETERIZATION     CATARACT EXTRACTION W/PHACO Right 01/03/2017   Procedure: CATARACT EXTRACTION PHACO AND INTRAOCULAR LENS PLACEMENT (IOC);  Surgeon: Dene Etienne, MD;  Location: [facility] ORS;  Service: Ophthalmology;  Laterality: Right;  US  00:50 AP% 19.5 CDE 9.94 fluid pack lot # [id number]   CATARACT EXTRACTION W/PHACO Left 10/27/2016   Procedure:  CATARACT EXTRACTION PHACO AND INTRAOCULAR LENS PLACEMENT (IOC);  Surgeon: Mittie Gaskin, MD;  Location: [facility] ORS;  Service: Ophthalmology;  Laterality: Left;  US   01:12 AP%  21.0 CDE  11.59 Fluid pack lot # [id number]   CHOLECYSTECTOMY     ICD GENERATOR CHANGEOUT [name] 03/29/2022   Procedure: ICD GENERATOR CHANGEOUT;  Surgeon: Fernande Elspeth BROCKS, MD;  Location[facility] INVAS[facility] CV LAB;  Service: Cardiovascular;  Laterality: [name];   INSERT / REPLACE /  REMOVE PACEMAKER     AICD   IR BONE MARROW BIOPSY & ASPIRATION  07/06/2023   LEAD REVISION [name] 02/11/2015   Procedure: LEAD REVISION;  Surgeon: Elspeth BROCKS Fernande, MD;  Location: [facility] CATH LAB;  Service: Cardiovascular;  Laterality: [name];   PARTIAL HYSTERECTOMY  1977   TOOTH EXTRACTION  09/23/2015    OB History     Gravida  4   Para  4   Term      Preterm      AB      Living         SAB      IAB      Ectopic      Multiple      Live Births               Home Medications    Prior to Admission medications   Medication Sig Start Date End Date Taking? Authorizing Provider  acetaminophen  (TYLENOL ) 500 MG tablet Take 500 mg by mouth at bedtime as needed.    [provider]  Alcohol Swabs (DROPSAFE ALCOHOL PREP) 70 % PADS USE ONE TIME DAILY 12/08/23   Gasper Nancyann BRAVO, MD  atorvastatin  (LIPITOR) 20 MG tablet TAKE 1 TABLET EVERY DAY 11/07/23   Gasper Nancyann BRAVO, MD  Blood Glucose Calibration (TRUE METRIX LEVEL 1) Low SOLN Use as directed with glucose meter 06/29/20   Gasper Nancyann BRAVO, MD  carvedilol  (COREG ) 3.125 MG tablet TAKE 1 TABLET TWICE DAILY WITH MEALS (NEED MD APPOINTMENT) Patient not taking: Reported on 09/15/2023 06/27/23   Gasper Nancyann BRAVO, MD  dapagliflozin  propanediol (FARXIGA ) 10 MG TABS tablet Take 1 tablet (10 mg total) by mouth daily. 09/15/23   Gasper Nancyann BRAVO, MD  ferrous sulfate  325 (65 FE) MG tablet Take 325 mg by mouth 2 (two) times daily with a meal.    [provider]  furosemide  (LASIX ) 40 MG tablet Take 1 tablet (40 mg) by mouth every other day 01/11/22   Fernande Elspeth BROCKS, MD  levothyroxine  (SYNTHROID ) 50 MCG tablet Take 1 tablet (50 mcg total) by mouth daily before breakfast. 08/11/23   Gasper Nancyann BRAVO, MD  meclizine  (ANTIVERT ) 25 MG tablet Take 1 tablet (25 mg total) by mouth 3 (three) times daily as needed. Patient not taking: Reported on 08/14/2023 08/02/23   Siadecki, Sebastian, MD  Multiple Vitamin (MULTIVITAMIN) tablet Take 1 tablet by  mouth daily.    [provider]  pantoprazole  (PROTONIX ) 40 MG tablet TAKE 1 TABLET EVERY DAY 09/25/23   Gasper Nancyann BRAVO, MD  sacubitril -valsartan  (ENTRESTO ) 24-26 MG Take 0.5 tablets by mouth 2 (two) times daily. 10/17/19   [provider]  senna (SENOKOT) 8.6 MG tablet Take 1 tablet by mouth daily as needed.    [provider]  SitaGLIPtin -MetFORMIN  HCl (JANUMET  XR) [phone] MG TB24 TAKE 1 TABLET EVERY DAY (NEED MD APPOINTMENT FOR REFILLS) 08/21/23   Gasper Nancyann BRAVO, MD  TRUE METRIX BLOOD GLUCOSE TEST test strip CHECK BLOOD SUGAR EVERY DAY 09/08/23  Gasper Nancyann BRAVO, MD  TRUEplus Lancets 33G MISC TEST BLOOD SUGAR ONE TIME DAILY 07/31/23   Gasper Nancyann BRAVO, MD    Family History Family History  Problem Relation Age of Onset   Heart attack Mother    Prostate cancer Father    Ulcers Father    Heart disease Sister    Stroke Sister    Heart attack Sister    Diabetes Sister    Heart disease Sister    Diabetes Sister    Heart attack Sister    Diabetes Sister    Diabetes Sister    Diabetes Sister    Diabetes Brother    Heart attack Brother    Stroke Brother    Breast cancer Neg Hx     Social History Social History   Tobacco Use   Smoking status: Never   Smokeless tobacco: Former    Types: Snuff    Quit date: 10/27/1969  Vaping Use   Vaping status: Never Used  Substance Use Topics   Alcohol use: No    Alcohol/week: 0.0 standard drinks of alcohol   Drug use: No     Allergies   Other and Sulfa antibiotics   Review of Systems Review of Systems  Constitutional:  Negative for fever.  Cardiovascular:  Positive for chest pain. Negative for palpitations.  Gastrointestinal:  Negative for abdominal pain.  Genitourinary:  Negative for dysuria.  Musculoskeletal:  Positive for back pain.  Skin:  Negative for rash.  Neurological:  Negative for weakness and numbness.  All other systems reviewed and are negative.    Physical Exam Triage Vital  Signs ED Triage Vitals  Encounter Vitals Group     BP 12/14/23 0908 106/69     Systolic BP Percentile --      Diastolic BP Percentile --      Pulse Rate 12/14/23 0908 77     Resp 12/14/23 0908 18     Temp 12/14/23 0908 98.5 F (36.9 C)     Temp Source 12/14/23 0908 Oral     SpO2 12/14/23 0908 100 %     Weight --      Height --      Head Circumference --      Peak Flow --      Pain Score 12/14/23 0912 10     Pain Loc --      Pain Education --      Exclude from Growth Chart --    No data found.  Updated Vital Signs BP 106/69 (BP Location: Left Arm)   Pulse 77   Temp 98.5 F (36.9 C) (Oral)   Resp 18   LMP  (LMP Unknown)   SpO2 100%   Visual Acuity Right Eye Distance:   Left Eye Distance:   Bilateral Distance:    Right Eye Near:   Left Eye Near:    Bilateral Near:     Physical Exam Vitals and nursing note reviewed.  Constitutional:      Appearance: Normal appearance. She is well-developed and well-groomed.  HENT:     Head: Normocephalic.  Cardiovascular:     Rate and Rhythm: Normal rate and regular rhythm.     Heart sounds: Normal heart sounds.  Pulmonary:     Effort: Pulmonary effort is normal.     Breath sounds: Normal breath sounds and air entry.  Musculoskeletal:     Lumbar back: Spasms and tenderness present. No swelling, edema, deformity, signs of trauma, lacerations or bony tenderness. Normal  range of motion. Negative right straight leg raise test and negative left straight leg raise test. No scoliosis.       Back:  Neurological:     General: No focal deficit present.     Mental Status: She is alert and oriented to person, place, and time.     GCS: GCS eye subscore is 4. GCS verbal subscore is 5. GCS motor subscore is 6.     Cranial Nerves: No cranial nerve deficit.     Sensory: No sensory deficit.  Psychiatric:        Attention and Perception: Attention normal.        Mood and Affect: Mood normal.        Speech: Speech normal.        Behavior:  Behavior normal. Behavior is cooperative.      UC Treatments / Results  Labs (all labs ordered are listed, but only abnormal results are displayed) Labs Reviewed - No data to display  EKG   Radiology No results found.  Procedures Procedures (including critical care time)  Medications Ordered in UC Medications - No data to display  Initial Impression / Assessment and Plan / UC Course  I have reviewed the triage vital signs and the nursing notes.  Pertinent labs & imaging results that were available during my care of the patient were reviewed by me and considered in my medical decision making (see chart for details).  Clinical Course as of 12/14/23 0933  Thu Dec 14, 2023  0917 Pt has pacemaker, ventricular rate 75, QTC 477, pt denies active chest pain at present.  [JD]    Clinical Course User Index [JD] Jobe Mutch, Rilla, NP   Discussed exam findings and plan of care with patient, got to ER for further evaluation of chest pain.  Patient verbalized understanding to this provider.  Ddx: Chest pain, right sided low back pain, muscle spasm Final Clinical Impressions(s) / UC Diagnoses   Final diagnoses:  Chest pain, unspecified type  Acute right-sided low back pain without sciatica     Discharge Instructions      Go immediately to the ER for further evaluation of intermittent chest pain   ED Prescriptions   None    PDMP not reviewed this encounter.   Aminta Rilla, NP 12/14/23 1030

## 2023-12-14 NOTE — ED Triage Notes (Signed)
 Patient presents with c/o left sided chest discomfort and nausea x 2 days that comes and goes. Currently patient states she is chest pain free. Denies diaphoresis, dizziness or radiation. Patient also c/o low back pain x 2 weeks. Denies urinary symptoms at this time.

## 2023-12-24 ENCOUNTER — Other Ambulatory Visit: Payer: Self-pay | Admitting: Family Medicine

## 2023-12-24 DIAGNOSIS — E119 Type 2 diabetes mellitus without complications: Secondary | ICD-10-CM

## 2023-12-25 NOTE — Telephone Encounter (Signed)
Requested Prescriptions  Pending Prescriptions Disp Refills   TRUEplus Lancets 33G MISC [Pharmacy Med Name: TRUEplus Lancets 33G Miscellaneous] 100 each 1    Sig: TEST BLOOD SUGAR ONE TIME DAILY     Endocrinology: Diabetes - Testing Supplies Passed - 12/25/2023  4:44 PM      Passed - Valid encounter within last 12 months    Recent Outpatient Visits           4 months ago Chest pressure   [place] [facility] [place], [place], PA-C   4 months ago Type 2 diabetes mellitus with other specified complication, without long-term current use of insulin (HCC)   [place] [facility] Malva Limes, MD   1 year ago Weakness   [place] [facility] Alfredia Ferguson, PA-C   1 year ago Lower abdominal pain   [place] [facility] Malva Limes, MD   1 year ago Frequency of urination   [place] [facility] [place], [place], [place]

## 2023-12-26 ENCOUNTER — Ambulatory Visit (INDEPENDENT_AMBULATORY_CARE_PROVIDER_SITE_OTHER): Payer: Medicare HMO

## 2023-12-26 DIAGNOSIS — I428 Other cardiomyopathies: Secondary | ICD-10-CM | POA: Diagnosis not present

## 2023-12-27 ENCOUNTER — Ambulatory Visit: Payer: Medicare PPO | Admitting: Emergency Medicine

## 2023-12-27 VITALS — Ht 61.0 in | Wt 108.0 lb

## 2023-12-27 DIAGNOSIS — Z0000 Encounter for general adult medical examination without abnormal findings: Secondary | ICD-10-CM | POA: Diagnosis not present

## 2023-12-27 LAB — CUP PACEART REMOTE DEVICE CHECK
Battery Remaining Longevity: 66 mo
Battery Voltage: 2.99 V
Brady Statistic AP VP Percent: 0.05 %
Brady Statistic AP VS Percent: 0.01 %
Brady Statistic AS VP Percent: 98.66 %
Brady Statistic AS VS Percent: 1.28 %
Brady Statistic RA Percent Paced: 0.06 %
Brady Statistic RV Percent Paced: 8.72 %
Date Time Interrogation Session: 20250218042404
HighPow Impedance: 49 Ohm
Implantable Lead Connection Status: 753985
Implantable Lead Connection Status: 753985
Implantable Lead Connection Status: 753985
Implantable Lead Implant Date: 20160409
Implantable Lead Implant Date: 20160409
Implantable Lead Implant Date: 20160409
Implantable Lead Location: 753858
Implantable Lead Location: 753859
Implantable Lead Location: 753860
Implantable Lead Model: 4598
Implantable Lead Model: 5076
Implantable Pulse Generator Implant Date: 20230523
Lead Channel Impedance Value: 1064 Ohm
Lead Channel Impedance Value: 1083 Ohm
Lead Channel Impedance Value: 1083 Ohm
Lead Channel Impedance Value: 255.093
Lead Channel Impedance Value: 255.093
Lead Channel Impedance Value: 277.083
Lead Channel Impedance Value: 301.328
Lead Channel Impedance Value: 301.328
Lead Channel Impedance Value: 342 Ohm
Lead Channel Impedance Value: 361 Ohm
Lead Channel Impedance Value: 418 Ohm
Lead Channel Impedance Value: 475 Ohm
Lead Channel Impedance Value: 551 Ohm
Lead Channel Impedance Value: 551 Ohm
Lead Channel Impedance Value: 665 Ohm
Lead Channel Impedance Value: 893 Ohm
Lead Channel Impedance Value: 931 Ohm
Lead Channel Impedance Value: 950 Ohm
Lead Channel Pacing Threshold Amplitude: 0.375 V
Lead Channel Pacing Threshold Amplitude: 0.75 V
Lead Channel Pacing Threshold Amplitude: 1.75 V
Lead Channel Pacing Threshold Pulse Width: 0.4 ms
Lead Channel Pacing Threshold Pulse Width: 0.4 ms
Lead Channel Pacing Threshold Pulse Width: 1 ms
Lead Channel Sensing Intrinsic Amplitude: 0.5 mV
Lead Channel Sensing Intrinsic Amplitude: 0.5 mV
Lead Channel Sensing Intrinsic Amplitude: 8 mV
Lead Channel Sensing Intrinsic Amplitude: 8 mV
Lead Channel Setting Pacing Amplitude: 1.5 V
Lead Channel Setting Pacing Amplitude: 2 V
Lead Channel Setting Pacing Amplitude: 2.5 V
Lead Channel Setting Pacing Pulse Width: 0.4 ms
Lead Channel Setting Pacing Pulse Width: 1 ms
Lead Channel Setting Sensing Sensitivity: 0.3 mV
Zone Setting Status: 755011

## 2023-12-27 NOTE — Patient Instructions (Addendum)
Bethany [name] , Thank you for taking time to come for your Medicare Wellness Visit. I appreciate your ongoing commitment to your health goals. Please review the following plan we discussed and let me know if I can assist you in the future.   Referrals/Orders/Follow-Ups/Clinician Recommendations: I have scheduled you an appointment with Dr. Sherrie Mustache for 01/15/24 @ 9:00 am. You may call Dr. Helane Gunther at [facility] to schedule an appointment @ [phone].  This is a list of the screening recommended for you and due dates:  Health Maintenance  Topic Date Due   Zoster (Shingles) Vaccine (1 of 2) Never done   COVID-19 Vaccine (8 - 2024-25 season) 07/09/2023   Hemoglobin A1C  01/29/2024   Yearly kidney health urinalysis for diabetes  07/31/2024   Eye exam for diabetics  08/27/2024   Yearly kidney function blood test for diabetes  12/13/2024   Medicare Annual Wellness Visit  12/26/2024   DEXA scan (bone density measurement)  04/10/2025   DTaP/Tdap/Td vaccine (2 - Td or Tdap) 09/19/2032   Pneumonia Vaccine  Completed   Flu Shot  Completed   Hepatitis C Screening  Completed   HPV Vaccine  Aged Out   Colon Cancer Screening  Discontinued    Advanced directives: (Copy Requested) Please bring a copy of your health care power of attorney and living will to the office to be added to your chart at your convenience.  Next Medicare Annual Wellness Visit scheduled for next year: Yes, 01/01/25 @ 3:10pm (phone visit)

## 2023-12-27 NOTE — Progress Notes (Signed)
Subjective:   Bethany [name] is a 78 y.o. female who presents for Medicare Annual (Subsequent) preventive examination.  This patient declined Interactive audio and Acupuncturist. Therefore the visit was completed with audio only.   Visit Complete: Virtual I connected with  Natalea K Soward on 12/27/23 by a audio enabled telemedicine application and verified that I am speaking with the correct person using two identifiers.  Patient Location: Home  Provider Location: Home Office  I discussed the limitations of evaluation and management by telemedicine. The patient expressed understanding and agreed to proceed.  Vital Signs: Because this visit was a virtual/telehealth visit, some criteria may be missing or patient reported. Any vitals not documented were not able to be obtained and vitals that have been documented are patient reported.   Cardiac Risk Factors include: advanced age (>32men, >8 women);diabetes mellitus;dyslipidemia     Objective:    Today's Vitals   12/27/23 1507  Weight: 108 lb (49 kg)  Height: 5\' 1"  (1.549 m)   Body mass index is 20.41 kg/m.     12/27/2023    3:19 PM 12/14/2023    9:14 AM 08/02/2023    5:00 PM 12/30/2022   10:34 AM 12/20/2022    3:22 PM 12/01/2022    9:17 AM 09/02/2022    8:33 AM  Advanced Directives  Does Patient Have a Medical Advance Directive? Yes No No Yes Yes Yes No;Yes  Type of Estate agent of [place];Living will   Healthcare Power of [place];Living will  Healthcare Power of Attorney   Does patient want to make changes to medical advance directive? No - Patient declined        Copy of Healthcare Power of Attorney in Chart? No - copy requested   No - copy requested  No - copy requested     Current Medications (verified) Outpatient Encounter Medications as of 12/27/2023  Medication Sig   acetaminophen (TYLENOL) 500 MG tablet Take 500 mg by mouth at bedtime as needed.   Alcohol Swabs (DROPSAFE ALCOHOL  PREP) 70 % PADS USE ONE TIME DAILY   atorvastatin (LIPITOR) 20 MG tablet TAKE 1 TABLET EVERY DAY   Blood Glucose Calibration (TRUE METRIX LEVEL 1) Low SOLN Use as directed with glucose meter   cyclobenzaprine (FLEXERIL) 5 MG tablet Take 5 mg by mouth 3 (three) times daily as needed.   dapagliflozin propanediol (FARXIGA) 10 MG TABS tablet Take 1 tablet (10 mg total) by mouth daily.   ferrous sulfate 325 (65 FE) MG tablet Take 325 mg by mouth 2 (two) times daily with a meal.   furosemide (LASIX) 40 MG tablet Take 1 tablet (40 mg) by mouth every other day   levothyroxine (SYNTHROID) 50 MCG tablet Take 1 tablet (50 mcg total) by mouth daily before breakfast.   Multiple Vitamin (MULTIVITAMIN) tablet Take 1 tablet by mouth daily.   pantoprazole (PROTONIX) 40 MG tablet TAKE 1 TABLET EVERY DAY   sacubitril-valsartan (ENTRESTO) 24-26 MG Take 0.5 tablets by mouth 2 (two) times daily.   senna (SENOKOT) 8.6 MG tablet Take 1 tablet by mouth daily as needed.   SitaGLIPtin-MetFORMIN HCl (JANUMET XR) [phone] MG TB24 TAKE 1 TABLET EVERY DAY (NEED MD APPOINTMENT FOR REFILLS)   TRUE METRIX BLOOD GLUCOSE TEST test strip CHECK BLOOD SUGAR EVERY DAY   TRUEplus Lancets 33G MISC TEST BLOOD SUGAR ONE TIME DAILY   carvedilol (COREG) 3.125 MG tablet TAKE 1 TABLET TWICE DAILY WITH MEALS (NEED MD APPOINTMENT) (Patient not taking: Reported  on 12/27/2023)   meclizine (ANTIVERT) 25 MG tablet Take 1 tablet (25 mg total) by mouth 3 (three) times daily as needed. (Patient not taking: Reported on 12/27/2023)   No facility-administered encounter medications on file as of 12/27/2023.    Allergies (verified) Other and Sulfa antibiotics   History: Past Medical History:  Diagnosis Date   AICD (automatic cardioverter/defibrillator) present    Anemia    Anxiety    CHF (congestive heart failure) (HCC)    Complication of anesthesia    Depression    Diabetes (HCC)    Dilated cardiomyopathy (HCC)    Diverticulosis    Dyspnea     Dysrhythmia    Edema    FEET/ANKLES OCCAS   GERD (gastroesophageal reflux disease)    Hyperlipidemia    Hypothyroidism    LV dysfunction    Orthopnea    USES 3 PILLOWS   PONV (postoperative nausea and vomiting)    Reflux    Thrombocytopenia (HCC)    Thyroid disease    VHD (valvular heart disease)    Past Surgical History:  Procedure Laterality Date   ABDOMINAL HYSTERECTOMY     BI-VENTRICULAR IMPLANTABLE CARDIOVERTER DEFIBRILLATOR [name] 02/11/2015   Procedure: BI-VENTRICULAR IMPLANTABLE CARDIOVERTER DEFIBRILLATOR  (CRT-D);  Surgeon: Duke Salvia, MD;  Location: [facility] CATH LAB;  Service: Cardiovascular;  Laterality: [name];   BONE MARROW BIOPSY  2007   spine   BREAST BIOPSY Right    neg   BREAST EXCISIONAL BIOPSY Right    CARDIAC CATHETERIZATION     CATARACT EXTRACTION W/PHACO Right 01/03/2017   Procedure: CATARACT EXTRACTION PHACO AND INTRAOCULAR LENS PLACEMENT (IOC);  Surgeon: Lockie Mola, MD;  Location: [facility] ORS;  Service: Ophthalmology;  Laterality: Right;  [place] 00:50 AP% 19.5 CDE 9.94 fluid pack lot # [id number]   CATARACT EXTRACTION W/PHACO Left 10/27/2016   Procedure: CATARACT EXTRACTION PHACO AND INTRAOCULAR LENS PLACEMENT (IOC);  Surgeon: Lockie Mola, MD;  Location: [facility] ORS;  Service: Ophthalmology;  Laterality: Left;  [place]  01:12 AP%  21.0 CDE  11.59 Fluid pack lot # [id number]   CHOLECYSTECTOMY     ICD GENERATOR CHANGEOUT [name] 03/29/2022   Procedure: ICD GENERATOR CHANGEOUT;  Surgeon: Duke Salvia, MD;  Location: [facility] INVASIVE CV LAB;  Service: Cardiovascular;  Laterality: [name];   INSERT / REPLACE / REMOVE PACEMAKER     AICD   IR BONE MARROW BIOPSY & ASPIRATION  07/06/2023   LEAD REVISION [name] 02/11/2015   Procedure: LEAD REVISION;  Surgeon: Duke Salvia, MD;  Location: [facility] CATH LAB;  Service: Cardiovascular;  Laterality: [name];   PARTIAL HYSTERECTOMY  1977   TOOTH EXTRACTION  09/23/2015   Family History  Problem Relation Age of Onset   Heart attack Mother     Prostate cancer Father    Ulcers Father    Heart disease Sister    Stroke Sister    Heart attack Sister    Diabetes Sister    Heart disease Sister    Diabetes Sister    Heart attack Sister    Diabetes Sister    Diabetes Sister    Diabetes Sister    Diabetes Brother    Heart attack Brother    Stroke Brother    Breast cancer Neg Hx    Social History   Socioeconomic History   Marital status: Divorced    Spouse name: Not on file   Number of children: 4   Years of education: Not on file   Highest education level: GED  or equivalent  Occupational History   Occupation: retired  Tobacco Use   Smoking status: Never   Smokeless tobacco: Former    Types: Snuff    Quit date: 10/27/1969  Vaping Use   Vaping status: Never Used  Substance and Sexual Activity   Alcohol use: No    Alcohol/week: 0.0 standard drinks of alcohol   Drug use: No   Sexual activity: Not on file  Other Topics Concern   Not on file  Social History Narrative   Not on file   Social Drivers of Health   Financial Resource Strain: Low Risk  (12/27/2023)   Overall Financial Resource Strain (CARDIA)    Difficulty of Paying Living Expenses: Not very hard  Food Insecurity: No Food Insecurity (12/27/2023)   Hunger Vital Sign    Worried About Running Out of Food in the Last Year: Never true    Ran Out of Food in the Last Year: Never true  Transportation Needs: No Transportation Needs (12/27/2023)   PRAPARE - Administrator, Civil Service (Medical): No    Lack of Transportation (Non-Medical): No  Physical Activity: Insufficiently Active (12/27/2023)   Exercise Vital Sign    Days of Exercise per Week: 2 days    Minutes of Exercise per Session: 30 min  Stress: No Stress Concern Present (12/27/2023)   [employer] of Occupational Health - Occupational Stress Questionnaire    Feeling of Stress : Not at all  Social Connections: Moderately Integrated (12/27/2023)   Social Connection and Isolation  Panel [NHANES]    Frequency of Communication with Friends and Family: More than three times a week    Frequency of Social Gatherings with Friends and Family: More than three times a week    Attends Religious Services: More than 4 times per year    Active Member of [employer] or Organizations: Yes    Attends Engineer, structural: More than 4 times per year    Marital Status: Divorced    Tobacco Counseling Counseling given: Not Answered   Clinical Intake:  Pre-visit preparation completed: Yes  Pain : No/denies pain     BMI - recorded: 20.41 Nutritional Status: BMI of 19-24  Normal Nutritional Risks: None Diabetes: Yes CBG done?: No (FBS 100 per patient) Did pt. bring in CBG monitor from home?: No  How often do you need to have someone help you when you read instructions, pamphlets, or other written materials from your doctor or pharmacy?: 1 - Never  Interpreter Needed?: No  Information entered by :: Tora Kindred, CMA   Activities of Daily Living    12/27/2023    3:09 PM 07/06/2023    7:32 AM  In your present state of health, do you have any difficulty performing the following activities:  Hearing? 0 0  Vision? 0 0  Difficulty concentrating or making decisions? 1 0  Comment memory loss   Walking or climbing stairs? 0 0  Dressing or bathing? 0 0  Doing errands, shopping? 0   Preparing Food and eating ? N   Using the Toilet? N   In the past six months, have you accidently leaked urine? N   Do you have problems with loss of bowel control? N   Managing your Medications? N   Managing your Finances? N   Housekeeping or managing your Housekeeping? N     Patient Care Team: Malva Limes, MD as PCP - General (Family Medicine) Duke Salvia, MD as PCP - Electrophysiology (  Cardiology) Lamar Blinks, MD as PCP - Cardiology (Cardiology) Lamar Blinks, MD as Consulting Physician (Cardiology) Duke Salvia, MD as Consulting Physician  (Cardiology) Lockie Mola, MD as Referring Physician (Ophthalmology) [place] Crane, MD as Consulting Physician (Ophthalmology) Helane Gunther, DPM as Consulting Physician (Podiatry) Rickard Patience, MD as Consulting Physician (Oncology)  Indicate any recent Medical Services you may have received from other than [facility] providers in the past year (date may be approximate).     Assessment:   This is a routine wellness examination for Rylie.  Hearing/Vision screen Hearing Screening - Comments:: Denies hearing loss Vision Screening - Comments:: Gets eye exams, [facility]   Goals Addressed             This Visit's Progress    Patient Stated       Maintain current health      Depression Screen    12/27/2023    3:17 PM 08/08/2023    9:49 AM 12/20/2022    3:18 PM 11/25/2022    1:27 PM 05/24/2022    1:07 PM 12/16/2021    3:38 PM 04/12/2021   10:22 AM  PHQ 2/9 Scores  PHQ - 2 Score 0 0 0 0 0 0 0  PHQ- 9 Score  4 0 0   12    Fall Risk    12/27/2023    3:22 PM 12/20/2022    3:15 PM 11/25/2022    1:28 PM 12/16/2021    3:40 PM 12/15/2021    9:22 PM  Fall Risk   Falls in the past year? 0 0 0 0 0  Number falls in past yr: 0 0 0 0 0  Injury with Fall? 0 0 0 0 0  Risk for fall due to : No Fall Risks No Fall Risks  No Fall Risks   Follow up Falls prevention discussed;Falls evaluation completed Education provided;Falls prevention discussed  Falls evaluation completed     MEDICARE RISK AT HOME: Medicare Risk at Home Any stairs in or around the home?: Yes If so, are there any without handrails?: No Home free of loose throw rugs in walkways, pet beds, electrical cords, etc?: Yes Adequate lighting in your home to reduce risk of falls?: Yes Life alert?: No Use of a cane, walker or w/c?: No Grab bars in the bathroom?: No Shower chair or bench in shower?: No Elevated toilet seat or a handicapped toilet?: No  TIMED UP AND GO:  Was the test performed?  No    Cognitive  Function:        12/27/2023    3:23 PM 12/20/2022    3:23 PM 04/09/2020   11:09 AM 04/04/2018    1:49 PM 03/23/2017    3:05 PM  6CIT Screen  What Year? 0 points 0 points 0 points 0 points 0 points  What month? 0 points 0 points 0 points 0 points 0 points  What time? 0 points 0 points 0 points 0 points 0 points  Count back from 20 0 points 0 points 0 points 0 points 0 points  Months in reverse 2 points 0 points 0 points 0 points 0 points  Repeat phrase 6 points 0 points 2 points 6 points 6 points  Total Score 8 points 0 points 2 points 6 points 6 points    Immunizations Immunization History  Administered Date(s) Administered   Fluad Quad(high Dose 65+) 07/22/2019, 09/14/2020   Influenza, High Dose Seasonal PF 07/07/2016, 10/09/2017, 08/01/2018, 08/21/2021  Influenza,inj,Quad PF,6+ Mos 08/14/2015   Influenza-Unspecified 09/19/2022   PFIZER Comirnaty(Gray Top)Covid-19 Tri-Sucrose Vaccine 01/02/2020, 01/07/2020, 08/24/2020, 08/30/2021   PFIZER(Purple Top)SARS-COV-2 Vaccination 12/13/2019, 01/07/2020   Pfizer Covid-19 Vaccine Bivalent Booster 28yrs & up 09/19/2022   Pneumococcal Conjugate-13 09/18/2014   Pneumococcal Polysaccharide-23 09/11/2015   Tdap 09/19/2022    TDAP status: Up to date  Flu Vaccine status: Up to date  Pneumococcal vaccine status: Up to date  Covid-19 vaccine status: Information provided on how to obtain vaccines.   Qualifies for Shingles Vaccine? Yes   Zostavax completed No   Shingrix Completed?: No.    Education has been provided regarding the importance of this vaccine. Patient has been advised to call insurance company to determine out of pocket expense if they have not yet received this vaccine. Advised may also receive vaccine at local pharmacy or Health Dept. Verbalized acceptance and understanding.  Screening Tests Health Maintenance  Topic Date Due   Zoster Vaccines- Shingrix (1 of 2) Never done   COVID-19 Vaccine (8 - 2024-25 season) 07/09/2023    INFLUENZA VACCINE  02/05/2024 (Originally 06/08/2023)   HEMOGLOBIN A1C  01/29/2024   Diabetic kidney evaluation - Urine ACR  07/31/2024   OPHTHALMOLOGY EXAM  08/27/2024   Diabetic kidney evaluation - eGFR measurement  12/13/2024   Medicare Annual Wellness (AWV)  12/26/2024   DEXA SCAN  04/10/2025   DTaP/Tdap/Td (2 - Td or Tdap) 09/19/2032   Pneumonia Vaccine 59+ Years old  Completed   Hepatitis C Screening  Completed   HPV VACCINES  Aged Out   Colonoscopy  Discontinued    Health Maintenance  Health Maintenance Due  Topic Date Due   Zoster Vaccines- Shingrix (1 of 2) Never done   COVID-19 Vaccine (8 - 2024-25 season) 07/09/2023    Colorectal cancer screening: No longer required.   Mammogram status: No longer required due to age.  Bone Density status: Completed 04/11/23. Results reflect: Bone density results: OSTEOPENIA. Repeat every 2 years.  Lung Cancer Screening: (Low Dose CT Chest recommended if Age 74-80 years, 20 pack-year currently smoking OR have quit w/in 15years.) does not qualify.   Lung Cancer Screening Referral: n/a  Additional Screening:  Hepatitis C Screening: does not qualify; Completed 12/01/22  Vision Screening: Recommended annual ophthalmology exams for early detection of glaucoma and other disorders of the eye. Is the patient up to date with their annual eye exam?  Yes  Who is the provider or what is the name of the office in which the patient attends annual eye exams? [facility] If pt is not established with a provider, would they like to be referred to a provider to establish care? Yes .   Dental Screening: Recommended annual dental exams for proper oral hygiene  Diabetic Foot Exam: Not a candidate per [facility]  Community Resource Referral / Chronic Care Management: CRR required this visit?  No   CCM required this visit?  No     Plan:     I have personally reviewed and noted the following in the patient's chart:   Medical and social history Use  of alcohol, tobacco or illicit drugs  Current medications and supplements including opioid prescriptions. Patient is not currently taking opioid prescriptions. Functional ability and status Nutritional status Physical activity Advanced directives List of other physicians Hospitalizations, surgeries, and ER visits in previous 12 months Vitals Screenings to include cognitive, depression, and falls Referrals and appointments  In addition, I have reviewed and discussed with patient certain preventive protocols, quality metrics,  and best practice recommendations. A written personalized care plan for preventive services as well as general preventive health recommendations were provided to patient.     Tora Kindred, CMA   12/27/2023   After Visit Summary: (MyChart) Due to this being a telephonic visit, the after visit summary with patients personalized plan was offered to patient via MyChart   Nurse Notes:  6 CIT Score - 8 Scheduled 4 mth follow up from 08/01/23 OV for 01/15/24 Declined DM & Nutrition education

## 2024-01-04 ENCOUNTER — Other Ambulatory Visit: Payer: Self-pay

## 2024-01-04 ENCOUNTER — Encounter: Payer: Self-pay | Admitting: Emergency Medicine

## 2024-01-04 ENCOUNTER — Emergency Department
Admission: EM | Admit: 2024-01-04 | Discharge: 2024-01-04 | Disposition: A | Discharge status: Home / Self Care | Payer: Medicare PPO | Attending: Emergency Medicine | Admitting: Emergency Medicine

## 2024-01-04 ENCOUNTER — Emergency Department: Payer: Medicare PPO

## 2024-01-04 DIAGNOSIS — I5020 Unspecified systolic (congestive) heart failure: Secondary | ICD-10-CM | POA: Insufficient documentation

## 2024-01-04 DIAGNOSIS — R000 Tachycardia, unspecified: Secondary | ICD-10-CM | POA: Diagnosis not present

## 2024-01-04 DIAGNOSIS — E119 Type 2 diabetes mellitus without complications: Secondary | ICD-10-CM | POA: Diagnosis not present

## 2024-01-04 DIAGNOSIS — I110 Hypertensive heart disease with heart failure: Secondary | ICD-10-CM | POA: Diagnosis not present

## 2024-01-04 DIAGNOSIS — U071 COVID-19: Secondary | ICD-10-CM | POA: Diagnosis not present

## 2024-01-04 DIAGNOSIS — R531 Weakness: Secondary | ICD-10-CM | POA: Diagnosis present

## 2024-01-04 LAB — COMPREHENSIVE METABOLIC PANEL
ALT: 17 U/L (ref 0–44)
AST: 27 U/L (ref 15–41)
Albumin: 3.8 g/dL (ref 3.5–5.0)
Alkaline Phosphatase: 61 U/L (ref 38–126)
Anion gap: 13 (ref 5–15)
BUN: 14 mg/dL (ref 8–23)
CO2: 23 mmol/L (ref 22–32)
Calcium: 9.1 mg/dL (ref 8.9–10.3)
Chloride: 99 mmol/L (ref 98–111)
Creatinine, Ser: 0.9 mg/dL (ref 0.44–1.00)
GFR, Estimated: >60 mL/min (ref >60)
Glucose, Bld: 195 mg/dL — ABNORMAL HIGH (ref 70–99)
Potassium: 2.9 mmol/L — ABNORMAL LOW (ref 3.5–5.1)
Sodium: 135 mmol/L (ref 135–145)
Total Bilirubin: 1 mg/dL (ref 0.0–1.2)
Total Protein: 6.9 g/dL (ref 6.5–8.1)

## 2024-01-04 LAB — CBC WITH DIFFERENTIAL/PLATELET
Abs Immature Granulocytes: 0.01 10*3/uL (ref 0.00–0.07)
Basophils Absolute: 0 10*3/uL (ref 0.0–0.1)
Basophils Relative: 0 %
Eosinophils Absolute: 0 10*3/uL (ref 0.0–0.5)
Eosinophils Relative: 0 %
HCT: 36.6 % (ref 36.0–46.0)
Hemoglobin: 12.4 g/dL (ref 12.0–15.0)
Immature Granulocytes: 0 %
Lymphocytes Relative: 15 %
Lymphs Abs: 0.8 10*3/uL (ref 0.7–4.0)
MCH: 30.3 pg (ref 26.0–34.0)
MCHC: 33.9 g/dL (ref 30.0–36.0)
MCV: 89.5 fL (ref 80.0–100.0)
Monocytes Absolute: 0.7 10*3/uL (ref 0.1–1.0)
Monocytes Relative: 12 %
Neutro Abs: 4 10*3/uL (ref 1.7–7.7)
Neutrophils Relative %: 73 %
Platelets: 70 10*3/uL — ABNORMAL LOW (ref 150–400)
RBC: 4.09 MIL/uL (ref 3.87–5.11)
RDW: 12.9 % (ref 11.5–15.5)
WBC: 5.5 10*3/uL (ref 4.0–10.5)
nRBC: 0 % (ref 0.0–0.2)

## 2024-01-04 LAB — LACTIC ACID, PLASMA: Lactic Acid, Venous: 1.2 mmol/L (ref 0.5–1.9)

## 2024-01-04 MED ORDER — ACETAMINOPHEN 325 MG PO TABS
650.0000 mg | ORAL_TABLET | Freq: Once | ORAL | Status: AC
  Administered 2024-01-04: 650 mg via ORAL
  Filled 2024-01-04: qty 2

## 2024-01-04 MED ORDER — SODIUM CHLORIDE 0.9 % IV BOLUS
500.0000 mL | Freq: Once | INTRAVENOUS | Status: AC
  Administered 2024-01-04: 500 mL via INTRAVENOUS

## 2024-01-04 NOTE — ED Triage Notes (Signed)
 Patient to ED via POV- patient states she tested positive for covid yesterday at [facility]. C/o of cough and low BP.

## 2024-01-04 NOTE — Discharge Instructions (Signed)
 You should take cough medicine with guaifenesin as the active ingredient.  Often this will be under the brand name of Mucinex, although there are several others.  Follow-up with your primary care doctor.  Make sure to drink plenty of fluids over the next few days.  Return to the ER for new, worsening, or persistent severe weakness or lightheadedness, low blood pressure readings, shortness of breath, or any other new or worsening symptoms that concern you.

## 2024-01-04 NOTE — ED Provider Notes (Signed)
 [facility] Provider Note    Event Date/Time   First MD Initiated Contact with Patient 01/04/24 1614     (approximate)   History   Covid Positive   HPI  Bethany [name] is a 78 y.o. female with history of diabetes, hypertension, hyperlipidemia, HFrEF, and thyroid disease who presents with weakness and low blood pressure in the context of a COVID infection.  The patient states that she has been feeling sick for about 2 days.  She went to see her doctor yesterday and tested positive for COVID.  She has been feeling somewhat weak and slightly lightheaded today.  She checked her blood pressure and it was in the 90s systolic at 1 point at home.  She also reports a cough and some shortness of breath.  She denies any vomiting or diarrhea.  She has had a decreased appetite.  I reviewed the past medical records.  Confirmed that the patient was seen by internal medicine yesterday and tested positive for COVID.  Respiratory panel was otherwise negative.   Physical Exam   Triage Vital Signs: ED Triage Vitals  Encounter Vitals Group     BP 01/04/24 1503 94/67     Systolic BP Percentile --      Diastolic BP Percentile --      Pulse Rate 01/04/24 1503 (!) 118     Resp 01/04/24 1503 18     Temp 01/04/24 1503 99 F (37.2 C)     Temp Source 01/04/24 1503 Oral     SpO2 01/04/24 1503 96 %     Weight 01/04/24 1504 110 lb (49.9 kg)     Height 01/04/24 1504 5\' 1"  (1.549 m)     Head Circumference --      Peak Flow --      Pain Score 01/04/24 1504 0     Pain Loc --      Pain Education --      Exclude from Growth Chart --     Most recent vital signs: Vitals:   01/04/24 1836 01/04/24 1837  BP: 104/64 104/64  Pulse: 95 95  Resp: 18 18  Temp:  98.2 F (36.8 C)  SpO2: 96% 96%     General: Awake, no distress.  CV:  Good peripheral perfusion.  Slightly tachycardic, regular rhythm. Resp:  Normal effort.  Lungs CTAB. Abd:  No distention. Other:  No peripheral  edema.  Slightly dry mucous membranes.   ED Results / Procedures / Treatments   Labs (all labs ordered are listed, but only abnormal results are displayed) Labs Reviewed  COMPREHENSIVE METABOLIC PANEL - Abnormal; Notable for the following components:      Result Value   Potassium 2.9 (*)    Glucose, Bld 195 (*)    All other components within normal limits  CBC WITH DIFFERENTIAL/PLATELET - Abnormal; Notable for the following components:   Platelets 70 (*)    All other components within normal limits  LACTIC ACID, PLASMA     EKG     RADIOLOGY  Chest x-ray: I independently viewed and interpreted the images; there is no focal consolidation or edema  PROCEDURES:  Critical Care performed: No  Procedures   MEDICATIONS ORDERED IN ED: Medications  acetaminophen (TYLENOL) tablet 650 mg (650 mg Oral Given 01/04/24 1656)  sodium chloride 0.9 % bolus 500 mL (0 mLs Intravenous Stopped 01/04/24 1831)     IMPRESSION / MDM / ASSESSMENT AND PLAN / ED COURSE  I reviewed the  triage vital signs and the nursing notes.  78 year old female with PMH as noted above presents with some generalized weakness, lightheadedness, low blood pressure, and cough; she was diagnosed with COVID yesterday.  On exam her blood pressure is borderline low.  Heart rate and temperature are borderline elevated.  She is overall well-appearing.  Physical exam is unremarkable for other acute findings.  Differential diagnosis includes, but is not limited to, COVID infection, dehydration/hypovolemia, electrolyte abnormality, other metabolic cause, sepsis.  I have a low suspicion for cardiac etiology.  We will give acetaminophen for her elevated temperature, a small fluid bolus, and reassess.  Patient's presentation is most consistent with acute complicated illness / injury requiring diagnostic workup.  ----------------------------------------- 6:56 PM on 01/04/2024 -----------------------------------------  CMP and  CBC show no significant findings except for borderline hypokalemia.  Lactate is negative.  There is no indication for repeat.  The patient blood pressure has improved with fluids.  This is her normal range.  Her heart rate has also normalized.  She is feeling better and able to walk around without difficulty.  Chest x-ray is clear and the patient has no significant respiratory symptoms at this time.  The patient is stable for discharge home.  I counseled her on the results of the workup.  She was offered Paxlovid yesterday but declined due to the cost.  I feel that this is reasonable.  There is no indication for other prescription medication at this time.  I gave strict return precautions and the patient expressed understanding.   FINAL CLINICAL IMPRESSION(S) / ED DIAGNOSES   Final diagnoses:  COVID     Rx / DC Orders   ED Discharge Orders     None        Note:  This document was prepared using Dragon voice recognition software and may include unintentional dictation errors.    Dionne Bucy, MD 01/04/24 [phone]

## 2024-01-05 ENCOUNTER — Telehealth: Payer: Self-pay | Admitting: Family Medicine

## 2024-01-05 NOTE — Telephone Encounter (Signed)
 Yes, she can take both

## 2024-01-05 NOTE — Telephone Encounter (Signed)
 Copied from CRM [id number]. Topic: Clinical - Medical Advice >> Jan 05, 2024  2:15 PM Dominique A wrote: Reason for CRM: Patient states that she went to [facility] and they prescribed: Benzonatate and referred her to the hospital for Covid. At the hospital she was advised to take Mucinix (she has the one for high blood pressure and diabetes) Patient wants to know if it is safe for her to take both medications or if she only need to take one. Please advise.   Best number: [phone]

## 2024-01-08 ENCOUNTER — Ambulatory Visit: Payer: Self-pay | Admitting: Family Medicine

## 2024-01-08 NOTE — Telephone Encounter (Signed)
 Copied from CRM [id number]. Topic: Clinical - Medication Question >> Jan 08, 2024 10:10 AM Elle L wrote: Reason for CRM: The patient has been taking Mucinex, 2 tablets every 4 hours, for COVID per the emergency room and she is unsure how long should she take it. Her symptoms are improving. The patient's call back number is [phone]. Reason for Disposition  Health Information question, no triage required and triager able to answer question  Answer Assessment - Initial Assessment Questions 1. REASON FOR CALL or QUESTION: "What is your reason for calling today?" or "How can I best help you?" or "What question do you have that I can help answer?"     Patient calling to see how long she would be taking Mucinex after testing positive for COVID on 01/03/2024. Patient states symptoms are improving. Patient is instructed that she has slow down how often she takes Mucinex since her symptoms are improving. Patient is encouraging to drink fluids and monitor her symptoms  Protocols used: Information Only Call - No Triage-A-AH

## 2024-01-12 ENCOUNTER — Ambulatory Visit: Payer: Self-pay | Admitting: Family Medicine

## 2024-01-12 ENCOUNTER — Encounter: Payer: Self-pay | Admitting: Internal Medicine

## 2024-01-12 ENCOUNTER — Ambulatory Visit: Admitting: Internal Medicine

## 2024-01-12 VITALS — BP 102/60 | HR 90 | Temp 98.2°F | Ht 61.0 in | Wt 107.2 lb

## 2024-01-12 DIAGNOSIS — R058 Other specified cough: Secondary | ICD-10-CM

## 2024-01-12 DIAGNOSIS — U071 COVID-19: Secondary | ICD-10-CM

## 2024-01-12 DIAGNOSIS — E876 Hypokalemia: Secondary | ICD-10-CM

## 2024-01-12 DIAGNOSIS — R531 Weakness: Secondary | ICD-10-CM | POA: Diagnosis not present

## 2024-01-12 DIAGNOSIS — R197 Diarrhea, unspecified: Secondary | ICD-10-CM | POA: Diagnosis not present

## 2024-01-12 MED ORDER — GUAIFENESIN-CODEINE 100-10 MG/5ML PO SOLN: 5.0000 mL | Freq: Three times a day (TID) | ORAL | 0 refills | Status: DC | PRN

## 2024-01-12 MED ORDER — CETIRIZINE HCL 10 MG PO TABS: 10.0000 mg | ORAL_TABLET | Freq: Every day | ORAL | 0 refills | Status: DC

## 2024-01-12 NOTE — Patient Instructions (Signed)

## 2024-01-12 NOTE — Progress Notes (Signed)
 Subjective:    Patient ID: Bethany [name], female    DOB: August 03, 1946, 78 y.o.   MRN: [id number]  HPI  Patient presents to clinic today for ER follow-up.  She initially went to urgent care 2/26 with complaint of urinary symptoms x 3 days.  She tested positive for COVID.  She was treated with molnupiravir and tessalon.  Given her weakness and low blood pressure, they recommended that she go to the ER for further evaluation however she declined at that time.  She did eventually present to the ER 2/27 complaint of weakness, shortness of breath, diarrhea and lightheadedness.  Her labs indicated low platelets which is a chronic issue for her.  Her potassium was 2.9 which was replaced.  She again tested positive for COVID, negative for influenza.  Her chest x-ray was negative for infiltrate.  She received IV fluids and was advised to continue as her previous prescriptions for molnupiravir and tessalon.  She was discharged the same day and advised to follow-up with her PCP.  Since that time, she reports she has not picked up the molnupiravir due to the cost. She reports feeling fatigued, headache, runny nose, nasal congestion, cough, shortness of breath, diarrhea, and weakness. She is not currently taking anything for her symptoms. She does feel like she is getting slowly better. She has never had covid before.  Review of Systems   Past Medical History:  Diagnosis Date   AICD (automatic cardioverter/defibrillator) present    Anemia    Anxiety    CHF (congestive heart failure) (HCC)    Complication of anesthesia    Depression    Diabetes (HCC)    Dilated cardiomyopathy (HCC)    Diverticulosis    Dyspnea    Dysrhythmia    Edema    FEET/ANKLES OCCAS   GERD (gastroesophageal reflux disease)    Hyperlipidemia    Hypothyroidism    LV dysfunction    Orthopnea    USES 3 PILLOWS   PONV (postoperative nausea and vomiting)    Reflux    Thrombocytopenia (HCC)    Thyroid disease    VHD (valvular  heart disease)     Current Outpatient Medications  Medication Sig Dispense Refill   acetaminophen (TYLENOL) 500 MG tablet Take 500 mg by mouth at bedtime as needed.     Alcohol Swabs (DROPSAFE ALCOHOL PREP) 70 % PADS USE ONE TIME DAILY 100 each 3   atorvastatin (LIPITOR) 20 MG tablet TAKE 1 TABLET EVERY DAY 90 tablet 3   Blood Glucose Calibration (TRUE METRIX LEVEL 1) Low SOLN Use as directed with glucose meter 1 each 3   carvedilol (COREG) 3.125 MG tablet TAKE 1 TABLET TWICE DAILY WITH MEALS (NEED MD APPOINTMENT) (Patient not taking: Reported on 12/27/2023) 180 tablet 3   cyclobenzaprine (FLEXERIL) 5 MG tablet Take 5 mg by mouth 3 (three) times daily as needed.     dapagliflozin propanediol (FARXIGA) 10 MG TABS tablet Take 1 tablet (10 mg total) by mouth daily. 90 tablet 3   ferrous sulfate 325 (65 FE) MG tablet Take 325 mg by mouth 2 (two) times daily with a meal.     furosemide (LASIX) 40 MG tablet Take 1 tablet (40 mg) by mouth every other day 30 tablet    levothyroxine (SYNTHROID) 50 MCG tablet Take 1 tablet (50 mcg total) by mouth daily before breakfast. 90 tablet 3   meclizine (ANTIVERT) 25 MG tablet Take 1 tablet (25 mg total) by mouth 3 (three) times daily  as needed. (Patient not taking: Reported on 12/27/2023) 15 tablet 0   Multiple Vitamin (MULTIVITAMIN) tablet Take 1 tablet by mouth daily.     pantoprazole (PROTONIX) 40 MG tablet TAKE 1 TABLET EVERY DAY 90 tablet 3   sacubitril-valsartan (ENTRESTO) 24-26 MG Take 0.5 tablets by mouth 2 (two) times daily.     senna (SENOKOT) 8.6 MG tablet Take 1 tablet by mouth daily as needed.     SitaGLIPtin-MetFORMIN HCl (JANUMET XR) [phone] MG TB24 TAKE 1 TABLET EVERY DAY (NEED MD APPOINTMENT FOR REFILLS) 90 tablet 1   TRUE METRIX BLOOD GLUCOSE TEST test strip CHECK BLOOD SUGAR EVERY DAY 100 strip 3   TRUEplus Lancets 33G MISC TEST BLOOD SUGAR ONE TIME DAILY 100 each 1   No current facility-administered medications for this visit.    Allergies   Allergen Reactions   Other Other (See Comments)    NO BLOOD PRODUCTS- JEHOVAHS WITNESS   Sulfa Antibiotics Other (See Comments)    Loss of taste    Family History  Problem Relation Age of Onset   Heart attack Mother    Prostate cancer Father    Ulcers Father    Heart disease Sister    Stroke Sister    Heart attack Sister    Diabetes Sister    Heart disease Sister    Diabetes Sister    Heart attack Sister    Diabetes Sister    Diabetes Sister    Diabetes Sister    Diabetes Brother    Heart attack Brother    Stroke Brother    Breast cancer Neg Hx     Social History   Socioeconomic History   Marital status: Divorced    Spouse name: Not on file   Number of children: 4   Years of education: Not on file   Highest education level: GED or equivalent  Occupational History   Occupation: retired  Tobacco Use   Smoking status: Never   Smokeless tobacco: Former    Types: Snuff    Quit date: 10/27/1969  Vaping Use   Vaping status: Never Used  Substance and Sexual Activity   Alcohol use: No    Alcohol/week: 0.0 standard drinks of alcohol   Drug use: No   Sexual activity: Not on file  Other Topics Concern   Not on file  Social History Narrative   Not on file   Social Drivers of Health   Financial Resource Strain: Low Risk  (12/27/2023)   Overall Financial Resource Strain (CARDIA)    Difficulty of Paying Living Expenses: Not very hard  Food Insecurity: No Food Insecurity (12/27/2023)   Hunger Vital Sign    Worried About Running Out of Food in the Last Year: Never true    Ran Out of Food in the Last Year: Never true  Transportation Needs: No Transportation Needs (12/27/2023)   PRAPARE - Administrator, Civil Service (Medical): No    Lack of Transportation (Non-Medical): No  Physical Activity: Insufficiently Active (12/27/2023)   Exercise Vital Sign    Days of Exercise per Week: 2 days    Minutes of Exercise per Session: 30 min  Stress: No Stress Concern  Present (12/27/2023)   [employer] of Occupational Health - Occupational Stress Questionnaire    Feeling of Stress : Not at all  Social Connections: Moderately Integrated (12/27/2023)   Social Connection and Isolation Panel [NHANES]    Frequency of Communication with Friends and Family: More than three times a  week    Frequency of Social Gatherings with Friends and Family: More than three times a week    Attends Religious Services: More than 4 times per year    Active Member of [employer] or Organizations: Yes    Attends Engineer, structural: More than 4 times per year    Marital Status: Divorced  Intimate Partner Violence: Not At Risk (12/27/2023)   Humiliation, Afraid, Rape, and Kick questionnaire    Fear of Current or Ex-Partner: No    Emotionally Abused: No    Physically Abused: No    Sexually Abused: No     Constitutional: Patient reports fatigue, headache.  Denies fever, malaise, or abrupt weight changes.  HEENT: Patient reports runny nose, nasal congestion.  Denies eye pain, eye redness, ear pain, ringing in the ears, wax buildup, bloody nose, or sore throat. Respiratory: Patient reports cough and shortness of breath.  Denies difficulty breathing.   Cardiovascular: Denies chest pain, chest tightness, palpitations or swelling in the hands or feet.  Gastrointestinal: Patient reports diarrhea.  Denies abdominal pain, bloating, constipation, or blood in the stool.  GU: Denies urgency, frequency, pain with urination, burning sensation, blood in urine, odor or discharge. Musculoskeletal: Patient reports generalized weakness.  Denies decrease in range of motion, difficulty with gait, muscle pain or joint pain and swelling.  Skin: Denies redness, rashes, lesions or ulcercations.  Neurological: Denies dizziness, difficulty with memory, difficulty with speech or problems with balance and coordination.    No other specific complaints in a complete review of systems (except as listed  in HPI above).      Objective:   Physical Exam  BP 102/60 (BP Location: Left Arm, Patient Position: Sitting, Cuff Size: Normal)   Pulse 90   Temp 98.2 F (36.8 C)   Ht 5\' 1"  (1.549 m)   Wt 107 lb 3.2 oz (48.6 kg)   LMP  (LMP Unknown)   SpO2 99%   BMI 20.26 kg/m   Wt Readings from Last 3 Encounters:  01/04/24 110 lb (49.9 kg)  12/27/23 108 lb (49 kg)  08/14/23 113 lb 4.8 oz (51.4 kg)    General: Appears her stated age, well developed, well nourished in NAD. Skin: Warm, dry and intact.  HEENT: Head: normal shape and size, no sinus tenderness noted; Eyes: sclera white, no icterus, conjunctiva pink, PERRLA and EOMs intact; Nose: mucosa erythematous and moist, septum midline; Throat/Mouth: Teeth present, mucosa pink and moist, + PND, no exudate, lesions or ulcerations noted.  Neck: No adenopathy noted. Cardiovascular: Normal rate and rhythm. S1,S2 noted.  No murmur, rubs or gallops noted.  Pulmonary/Chest: Normal effort and positive vesicular breath sounds. No respiratory distress. No wheezes, rales or ronchi noted.  Abdomen: Soft and nontender. Normal bowel sounds.  Musculoskeletal:  No difficulty with gait.  Neurological: Alert and oriented.  Coordination normal.    BMET    Component Value Date/Time   NA 135 01/04/2024 1508   NA 140 08/08/2023 1045   NA 140 02/04/2015 1426   K 2.9 (L) 01/04/2024 1508   K 4.1 02/04/2015 1426   CL 99 01/04/2024 1508   CL 106 02/04/2015 1426   CO2 23 01/04/2024 1508   CO2 26 02/04/2015 1426   GLUCOSE 195 (H) 01/04/2024 1508   GLUCOSE 179 (H) 02/04/2015 1426   BUN 14 01/04/2024 1508   BUN 14 08/08/2023 1045   BUN 11 02/04/2015 1426   CREATININE 0.90 01/04/2024 1508   CREATININE 0.79 06/30/2023 1014  CREATININE 0.70 02/04/2015 1426   CALCIUM 9.1 01/04/2024 1508   CALCIUM 9.2 02/04/2015 1426   GFRNONAA >60 01/04/2024 1508   GFRNONAA >60 06/30/2023 1014   GFRNONAA >60 02/04/2015 1426   GFRAA 79 07/01/2020 1126   GFRAA >60  02/04/2015 1426    Lipid Panel     Component Value Date/Time   CHOL 111 08/01/2023 0835   CHOL 130 02/21/2014 0014   TRIG 56 08/01/2023 0835   TRIG 94 02/21/2014 0014   HDL 60 08/01/2023 0835   HDL 42 02/21/2014 0014   CHOLHDL 2.2 01/13/2021 0815   VLDL 19 02/21/2014 0014   LDLCALC 38 08/01/2023 0835   LDLCALC 69 02/21/2014 0014    CBC    Component Value Date/Time   WBC 5.5 01/04/2024 1508   RBC 4.09 01/04/2024 1508   HGB 12.4 01/04/2024 1508   HGB 12.3 08/08/2023 1045   HCT 36.6 01/04/2024 1508   HCT 38.3 08/08/2023 1045   PLT 70 (L) 01/04/2024 1508   PLT 88 (LL) 08/08/2023 1045   MCV 89.5 01/04/2024 1508   MCV 93 08/08/2023 1045   MCV 92 02/04/2015 1426   MCH 30.3 01/04/2024 1508   MCHC 33.9 01/04/2024 1508   RDW 12.9 01/04/2024 1508   RDW 12.4 08/08/2023 1045   RDW 13.2 02/04/2015 1426   LYMPHSABS 0.8 01/04/2024 1508   LYMPHSABS 1.2 08/08/2023 1045   LYMPHSABS 1.7 10/01/2014 0813   MONOABS 0.7 01/04/2024 1508   MONOABS 0.3 10/01/2014 0813   EOSABS 0.0 01/04/2024 1508   EOSABS 0.0 08/08/2023 1045   EOSABS 0.1 10/01/2014 0813   BASOSABS 0.0 01/04/2024 1508   BASOSABS 0.0 08/08/2023 1045   BASOSABS 0.0 10/01/2014 0813    Hgb A1C Lab Results  Component Value Date   HGBA1C 7.4 (H) 08/01/2023            Assessment & Plan:  ER follow-up for COVID, generalized weakness, shortness of breath, lightheadedness, diarrhea, hypokalemia:  ER notes, labs and imaging reviewed She declines repeating a CBC or metabolic panel today for reevaluation of hypokalemia and states "I will just eat more bananas". She declines Rx for albuterol inhaler for the shortness of breath and cough at this time She declines Rx for Pred taper for symptom management Rx for cetirizine 10 mg daily x 10 days Rx for Robitussin AC for cough, advised her that cough can last for 4 to 6 weeks after this viral illness No indication for chest x-ray at this time as lung sounds are clear No  indication for antibiotics at this time  Follow-up with your PCP as previously scheduled Nicki Reaper, NP

## 2024-01-12 NOTE — Telephone Encounter (Signed)
 Chief Complaint: weakness Symptoms: extremity weakness, cough, diarrhea, COVID Frequency: since 2/26 Pertinent Negatives: Patient denies fever, vomiting, SOB (today), CP Disposition: [] ED /[] Urgent Care (no appt availability in office) / [x] Appointment(In office/virtual)/ []  [facility]/ [] Home Care/ [] Refused Recommended Disposition /[] [facility]/ []  Follow-up with PCP Additional Notes: Pt reports weakness in her arms and legs, diarrhea, and a cough. Pt was diagnosed with COVID on 2/26 and was then seen in the ED on 2/27. Pt states she still has a cough with white sputum. Tessalon not helpful. States she had SOB yesterday but none today. Also endorses intermittent diarrhea now as well. States she is drinking a lot of water, denies dizziness or lightheadedness other than when she gets up too quickly but she states this is not new. No vomiting. No abd pain. Denies CP. Per protocol for weakness pt scheduled today at 1400 at [facility] because her PCP office had no availability. Pt agreeable to that plan. RN advised pt she needs to call 911 for SOB or CP and should call us back with any other worsening to which she verbalized understanding.   Copied from CRM [id number]. Topic: Clinical - Red Word Triage >> Jan 12, 2024 11:20 AM Bethany [name] wrote: Red Word that prompted transfer to Nurse Triage: Weakness. Previous diagnosed with Covid on 27th has been taking medication but still has cough and diarrhea Reason for Disposition  [1] MODERATE weakness (i.e., interferes with work, school, normal activities) AND [2] cause unknown  (Exceptions: Weakness from acute minor illness or poor fluid intake; weakness is chronic and not worse.)  [1] MILD difficulty breathing (e.g., minimal/no SOB at rest, SOB with walking, pulse <100) AND [2] still present when not coughing  Answer Assessment - Initial Assessment Questions 1. ONSET: "When did the cough begin?"      2/27 dx with COVID in the ED @  [place] 2. SEVERITY: "How bad is the cough today?"      States she is coughing every 10 minutes - more at night 3. SPUTUM: "Describe the color of your sputum" (none, dry cough; clear, white, yellow, green)     White  4. HEMOPTYSIS: "Are you coughing up any blood?" If so ask: "How much?" (flecks, streaks, tablespoons, etc.)     No 5. DIFFICULTY BREATHING: "Are you having difficulty breathing?" If Yes, ask: "How bad is it?" (e.g., mild, moderate, severe)    - MILD: No SOB at rest, mild SOB with walking, speaks normally in sentences, can lie down, no retractions, pulse < 100.    - MODERATE: SOB at rest, SOB with minimal exertion and prefers to sit, cannot lie down flat, speaks in phrases, mild retractions, audible wheezing, pulse 100-120.    - SEVERE: Very SOB at rest, speaks in single words, struggling to breathe, sitting hunched forward, retractions, pulse > 120      Endorses SOB yesterday - states "it felt like heaviness, kind of like indigestion." Denies CP at that time. No SOB today. Endorses SOB at rest yesterday. States SOB has been intermittent with COVID. 6. FEVER: "Do you have a fever?" If Yes, ask: "What is your temperature, how was it measured, and when did it start?"     No 7. CARDIAC HISTORY: "Do you have any history of heart disease?" (e.g., heart attack, congestive heart failure)      HF, hypotension, heart valve disease, left bundle branch block, pacemaker and defibrillator 8. LUNG HISTORY: "Do you have any history of lung disease?"  (  e.g., pulmonary embolus, asthma, emphysema)     No - pt states she does not wear O2 has worn it in the hospital 9. PE RISK FACTORS: "Do you have a history of blood clots?" (or: recent major surgery, recent prolonged travel, bedridden)     No 10. OTHER SYMPTOMS: "Do you have any other symptoms?" (e.g., runny nose, wheezing, chest pain)       "I went to a walk-in and they dx me with COVID and they wanted me to go to the hospital at that time but I  didn't go, I went the next day to the ED." Endorses weakness in the arms and legs. States that began after she had COVID. Does not need assistive device or to hold onto furniture to walk. No CP. Endorses diarrhea, states that started this week. "It don't start out watery but then it gets watery." No vomiting. No abdominal pain. States she is trying to drink a lot of water. Denies lightheadedness and dizziness, "just when I get up too quickly", states this is not exactly new.  Answer Assessment - Initial Assessment Questions 1. DESCRIPTION: "Describe how you are feeling."     Weakness in the arms and legs 2. SEVERITY: "How bad is it?"  "Can you stand and walk?"   - MILD (0-3): Feels weak or tired, but does not interfere with work, school or normal activities.   - MODERATE (4-7): Able to stand and walk; weakness interferes with work, school, or normal activities.   - SEVERE (8-10): Unable to stand or walk; unable to do usual activities.     Moderate  3. ONSET: "When did these symptoms begin?" (e.g., hours, days, weeks, months)     2/27 w/ COVID 4. CAUSE: "What do you think is causing the weakness or fatigue?" (e.g., not drinking enough fluids, medical problem, trouble sleeping)     COVID, diarrhea 5. NEW MEDICINES:  "Have you started on any new medicines recently?" (e.g., opioid pain medicines, benzodiazepines, muscle relaxants, antidepressants, antihistamines, neuroleptics, beta blockers)     Tessalon pearles with COVID 6. OTHER SYMPTOMS: "Do you have any other symptoms?" (e.g., chest pain, fever, cough, SOB, vomiting, diarrhea, bleeding, other areas of pain)     Weakness, cough, diarrhea  Protocols used: Cough - Acute Non-Productive-A-AH, Weakness (Generalized) and Fatigue-A-AH

## 2024-01-15 ENCOUNTER — Encounter: Payer: Self-pay | Admitting: Family Medicine

## 2024-01-15 ENCOUNTER — Ambulatory Visit (INDEPENDENT_AMBULATORY_CARE_PROVIDER_SITE_OTHER): Payer: Medicare PPO | Admitting: Family Medicine

## 2024-01-15 VITALS — BP 101/63 | HR 88 | Resp 16 | Ht 61.0 in | Wt 112.8 lb

## 2024-01-15 DIAGNOSIS — D696 Thrombocytopenia, unspecified: Secondary | ICD-10-CM | POA: Diagnosis not present

## 2024-01-15 DIAGNOSIS — U071 COVID-19: Secondary | ICD-10-CM | POA: Diagnosis not present

## 2024-01-15 DIAGNOSIS — E876 Hypokalemia: Secondary | ICD-10-CM

## 2024-01-15 DIAGNOSIS — I5022 Chronic systolic (congestive) heart failure: Secondary | ICD-10-CM

## 2024-01-15 DIAGNOSIS — E1169 Type 2 diabetes mellitus with other specified complication: Secondary | ICD-10-CM | POA: Diagnosis not present

## 2024-01-15 LAB — POCT GLYCOSYLATED HEMOGLOBIN (HGB A1C): Hemoglobin A1C: 7 % — AB (ref 4.0–5.6)

## 2024-01-15 NOTE — Progress Notes (Signed)
 Established patient visit   Patient: Bethany [name]   DOB: 09/28/1946   78 y.o. Female  MRN: [id number] Visit Date: 01/15/2024  Today's healthcare provider: [name], MD   Chief Complaint  Patient presents with   Diabetes   Subjective    Discussed the use of AI scribe software for clinical note transcription with the patient, who gave verbal consent to proceed.  History of Present Illness   The patient presents for follow up CHF and diabetes.   She was diagnosed with COVID-19 on February 26th, 2025, at a walk-in clinic. She experienced significant symptoms including a severe cough, fever, and difficulty breathing. Although her condition has improved, she still has a persistent cough and notes that her breathing is 'a little slower than usual.'  Her blood sugar levels were affected during her illness, with an A1c of 7.0 today, which is an improvement from a previous level of 7.4. She attributes the increase in her blood sugar levels to her recent illness with COVID-19.  Her potassium levels were noted to be low during recent blood work, but she does not take any potassium supplements.  She has a history of thrombocytopenia with recent platelet levels at 70, which is stable for her. Platelet levels were checked during a recent hospital visit and are consistent with her usual range.  She is no longer taking carvedilol, which was she states was discontinued a few months ago by her cardiologist. She mentions experiencing memory problems and has an upcoming appointment to address this issue. No swelling or fluid retention in her feet or ankles.     Lab Results  Component Value Date   NA 135 01/04/2024   K 2.9 (L) 01/04/2024   CREATININE 0.90 01/04/2024   GFRNONAA >60 01/04/2024   GLUCOSE 195 (H) 01/04/2024   Lab Results  Component Value Date   HGBA1C 7.0 (A) 01/15/2024   HGBA1C 7.4 (H) 08/01/2023   HGBA1C 7.2 (H) 11/25/2022   Lab Results  Component Value Date   WBC  5.5 01/04/2024   HGB 12.4 01/04/2024   HCT 36.6 01/04/2024   MCV 89.5 01/04/2024   PLT 70 (L) 01/04/2024     Medications: Outpatient Medications Prior to Visit  Medication Sig   acetaminophen (TYLENOL) 500 MG tablet Take 500 mg by mouth at bedtime as needed.   Alcohol Swabs (DROPSAFE ALCOHOL PREP) 70 % PADS USE ONE TIME DAILY   atorvastatin (LIPITOR) 20 MG tablet TAKE 1 TABLET EVERY DAY   Blood Glucose Calibration (TRUE METRIX LEVEL 1) Low SOLN Use as directed with glucose meter   cetirizine (ZYRTEC ALLERGY) 10 MG tablet Take 1 tablet (10 mg total) by mouth daily.   cyclobenzaprine (FLEXERIL) 5 MG tablet Take 5 mg by mouth 3 (three) times daily as needed.   dapagliflozin propanediol (FARXIGA) 10 MG TABS tablet Take 1 tablet (10 mg total) by mouth daily.   ferrous sulfate 325 (65 FE) MG tablet Take 325 mg by mouth 2 (two) times daily with a meal.   furosemide (LASIX) 40 MG tablet Take 1 tablet (40 mg) by mouth every other day   guaiFENesin-codeine 100-10 MG/5ML syrup Take 5 mLs by mouth 3 (three) times daily as needed for cough.   levothyroxine (SYNTHROID) 50 MCG tablet Take 1 tablet (50 mcg total) by mouth daily before breakfast.   Multiple Vitamin (MULTIVITAMIN) tablet Take 1 tablet by mouth daily.   pantoprazole (PROTONIX) 40 MG tablet TAKE 1 TABLET EVERY DAY  sacubitril-valsartan (ENTRESTO) 24-26 MG Take 0.5 tablets by mouth 2 (two) times daily.   senna (SENOKOT) 8.6 MG tablet Take 1 tablet by mouth daily as needed.   SitaGLIPtin-MetFORMIN HCl (JANUMET XR) [id number] MG TB24 TAKE 1 TABLET EVERY DAY (NEED MD APPOINTMENT FOR REFILLS)   TRUE METRIX BLOOD GLUCOSE TEST test strip CHECK BLOOD SUGAR EVERY DAY   TRUEplus Lancets 33G MISC TEST BLOOD SUGAR ONE TIME DAILY   carvedilol (COREG) 3.125 MG tablet TAKE 1 TABLET TWICE DAILY WITH MEALS (NEED MD APPOINTMENT) (Patient not taking: Reported on 01/15/2024)   meclizine (ANTIVERT) 25 MG tablet Take 1 tablet (25 mg total) by mouth 3 (three)  times daily as needed. (Patient not taking: Reported on 08/14/2023)   No facility-administered medications prior to visit.   Review of Systems  Constitutional:  Negative for appetite change, chills, fatigue and fever.  Respiratory:  Positive for cough and shortness of breath. Negative for chest tightness and wheezing.   Cardiovascular:  Negative for chest pain and palpitations.  Gastrointestinal:  Negative for abdominal pain, nausea and vomiting.  Neurological:  Negative for dizziness and weakness.       Objective    BP 101/63 (BP Location: Left Arm, Patient Position: Sitting, Cuff Size: Normal)   Pulse 88   Resp 16   Ht 5\' 1"  (1.549 m)   Wt 112 lb 12.8 oz (51.2 kg)   LMP  (LMP Unknown)   BMI 21.31 kg/m   Physical Exam   General: Appearance:    Well developed, well nourished female in no acute distress  Eyes:    PERRL, conjunctiva/corneas clear, EOM's intact       Lungs:     Clear to auscultation bilaterally, respirations unlabored  Heart:    Normal heart rate. Normal rhythm.  2/6 systolic murmur   MS:   All extremities are intact.    Neurologic:   Awake, alert, oriented x 3. No apparent focal neurological defect.           Results for orders placed or performed in visit on 01/15/24  POCT glycosylated hemoglobin (Hb A1C)  Result Value Ref Range   Hemoglobin A1C 7.0 (A) 4.0 - 5.6 %    Assessment & Plan        COVID-19 Symptoms improved but persistent cough and slightly reduced breathing capacity remain.  Type 2 Diabetes Mellitus A1c improved to 7.0 from 7.4. Recent COVID-19 likely elevated glucose levels, now improved. - Schedule follow-up appointment in four months to monitor blood glucose levels.  Hypokalemia Low potassium levels noted, possibly due to illness or medication. Risk of complications if untreated. - Recheck potassium levels with lab work.  Chronic systolic heart failure Doing on current regiment, but now off of carvedilol due to hypotension.  Continue current medications.  Continue routine follow up with cardiology.   Thrombocytopenia Platelet levels at 70, slight drop during illness typical. - Recheck platelet levels during follow-up with hematologist in April.    Return in about 4 months (around 05/16/2024) for Diabetes.      [name], MD  [facility] [phone] (phone) [phone] (fax)  [facility]

## 2024-01-15 NOTE — Patient Instructions (Signed)
 Bethany [name]  Please review the attached list of medications and notify my office if there are any errors.   . Please bring all of your medications to every appointment so we can make sure that our medication list is the same as yours.

## 2024-01-16 ENCOUNTER — Encounter: Payer: Self-pay | Admitting: Family Medicine

## 2024-01-16 LAB — RENAL FUNCTION PANEL
Albumin: 4.1 g/dL (ref 3.8–4.8)
BUN/Creatinine Ratio: 11 — ABNORMAL LOW (ref 12–28)
BUN: 8 mg/dL (ref 8–27)
CO2: 25 mmol/L (ref 20–29)
Calcium: 9.5 mg/dL (ref 8.7–10.3)
Chloride: 101 mmol/L (ref 96–106)
Creatinine, Ser: 0.76 mg/dL (ref 0.57–1.00)
Glucose: 172 mg/dL — ABNORMAL HIGH (ref 70–99)
Phosphorus: 3.3 mg/dL (ref 3.0–4.3)
Potassium: 4.7 mmol/L (ref 3.5–5.2)
Sodium: 139 mmol/L (ref 134–144)
eGFR: 81 mL/min/{1.73_m2} (ref >59)

## 2024-01-16 LAB — MAGNESIUM: Magnesium: 1.8 mg/dL (ref 1.6–2.3)

## 2024-01-19 ENCOUNTER — Other Ambulatory Visit: Payer: Self-pay | Admitting: Family Medicine

## 2024-01-23 ENCOUNTER — Encounter: Payer: Self-pay | Admitting: Internal Medicine

## 2024-01-31 NOTE — Progress Notes (Unsigned)
 Electrophysiology Clinic Note    Date:  02/01/2024  Patient ID:  Bethany [name], DOB [birth year], MRN [id number] PCP:  Malva Limes, MD  Cardiologist:  Alwyn Pea, MD Electrophysiologist: [name], MD    Discussed the use of AI scribe software for clinical note transcription with the patient, who gave verbal consent to proceed.   Patient Profile    Chief Complaint: CRT-D follow-up  History of Present Illness: Bethany [name] is a 78 y.o. female with PMH notable for NICM, LBBB, HFimpEF s/p CRT-D, T2DM ; seen today for [name], MD for routine electrophysiology followup.   I last saw her 03/2023 at which time she was c/o episodes of "leaping" in chest, was associated with diaphragmatic stim, unable to program around it. She also c/o separate chest pain. Gen cards at [facility] had recently seen her and planning work up. She has been seen several times by [facility] cards without a cause of her symptoms.  She last saw Dr. Juliann Pares earlier this week, borderline low BP. Diuretic changed to PRN from every other day   On follow-up today, she confirms that she continues to have chest discomfort that she and Dr. Juliann Pares are managing. She had covid about a month ago and noticed that her heart rates were significantly elevated during that time. She has not needed her PRN lasix since adjusting from daily to PRN. She weighs herself daily and writes the weights down to help remember.  She does endorse some memory loss.  Her coreg was stopped in the Fall 2024 d/t fatigue, LH by [facility] cardiology.       Arrhythmia/Device History MDT CRT-D, imp 02/2015 -gen change 2023  + diaphragmatic stim   ROS:  Please see the history of present illness. All other systems are reviewed and otherwise negative.    Physical Exam    VS:  BP 110/66 (BP Location: Left Arm, Patient Position: Sitting, Cuff Size: Normal)   Pulse 60   Ht 5\' 1"  (1.549 m)   Wt 113 lb 3.2 oz (51.3 kg)   LMP  (LMP Unknown)    SpO2 99%   BMI 21.39 kg/m  BMI: Body mass index is 21.39 kg/m.  Wt Readings from Last 3 Encounters:  02/01/24 113 lb 3.2 oz (51.3 kg)  01/15/24 112 lb 12.8 oz (51.2 kg)  01/12/24 107 lb 3.2 oz (48.6 kg)     GEN- The patient is well appearing, alert and oriented x 3 today.   Lungs- Clear to ausculation bilaterally, normal work of breathing.  Heart- Regular rate and rhythm, no murmurs, rubs or gallops Extremities- No peripheral edema, warm, dry Skin-  device pocket well-healed, no tethering   Device interrogation done today and reviewed by myself:  Battery 5.3 years LV threshold slightly elevated at 2.66mv at 1sec Changed to auto-threshold with 0.65mV safety margin SCAF noted One brief NSVT episode Low optivol High VP at 98.6%  - attempted to adjust LV threshold to lower mV at higher duration without improvement in battery life  Studies Reviewed   Previous EP, cardiology notes.    EKG is ordered. Personal review of EKG from today shows:    EKG Interpretation Date/Time:  Thursday February 01 2024 11:12:48 EDT Ventricular Rate:  90 PR Interval:  130 QRS Duration:  116 QT Interval:  402 QTC Calculation: 491 R Axis:   250  Text Interpretation: Atrial-sensed ventricular-paced rhythm Confirmed by Sherie Don [phone]) on 02/01/2024 11:15:08 AM    TTE, 03/20/2023 (  via CE) LOW-NORMAL LEFT VENTRICULAR SYSTOLIC FUNCTION WITH AN ESTIMATED EF = 50 %  NORMAL RIGHT VENTRICULAR SYSTOLIC FUNCTION  MILD TRICUSPID AND MITRAL VALVE INSUFFICIENCY  TRACE AORTIC VALVE INSUFFICIENCY  NO VALVULAR STENOSIS  GLS: -15.7 %   NM myocardial spect (03/20/2023 (via CE)  Borderline myocardial perfusion scan no evidence of  stress-induced myocardial ischemia mildly reduced left ventricular  function between 45 to 50% EKG is a paced rhythm this is a mild  intermediate scan recommend conservative medical therapy for now      Assessment and Plan     #) LBBB #) NICM #) s/p MDT CRT-D Device  functioning well, see paceart for details Slightly elevated LV threshold compared to prior; overall stable High VP   #) HFimpEF Recent TTE 03/2023 with near-normal LVEF at 50% GDMT farxiga, entresto Diuretic - lasix recently adjusted to PRN. Reviewed recommendation for daily weight first thing in the AM prior to meds, food. If weight gain 3lbs in 1 day or 5 lbs in 1 week, take lasix.  Unable to add BB d/t borderline BP readings, consider addition in follow-up      Current medicines are reviewed at length with the patient today.   The patient does not have concerns regarding her medicines.  The following changes were made today:  none  Labs/ tests ordered today include:  Orders Placed This Encounter  Procedures   EKG 12-Lead     Disposition: Follow up with Dr. Graciela Husbands or EP APP in 6 months   Signed, Sherie Don, NP  02/01/24  12:45 PM  Electrophysiology [facility]

## 2024-02-01 ENCOUNTER — Ambulatory Visit: Attending: Cardiology | Admitting: Cardiology

## 2024-02-01 ENCOUNTER — Encounter: Payer: Self-pay | Admitting: Cardiology

## 2024-02-01 VITALS — BP 110/66 | HR 60 | Ht 61.0 in | Wt 113.2 lb

## 2024-02-01 DIAGNOSIS — I5032 Chronic diastolic (congestive) heart failure: Secondary | ICD-10-CM

## 2024-02-01 DIAGNOSIS — Z95810 Presence of automatic (implantable) cardiac defibrillator: Secondary | ICD-10-CM

## 2024-02-01 DIAGNOSIS — I5022 Chronic systolic (congestive) heart failure: Secondary | ICD-10-CM

## 2024-02-01 DIAGNOSIS — I428 Other cardiomyopathies: Secondary | ICD-10-CM

## 2024-02-01 LAB — CUP PACEART INCLINIC DEVICE CHECK
Date Time Interrogation Session: 20250327125227
Implantable Lead Connection Status: 753985
Implantable Lead Connection Status: 753985
Implantable Lead Connection Status: 753985
Implantable Lead Implant Date: 20160409
Implantable Lead Implant Date: 20160409
Implantable Lead Implant Date: 20160409
Implantable Lead Location: 753858
Implantable Lead Location: 753859
Implantable Lead Location: 753860
Implantable Lead Model: 4598
Implantable Lead Model: 5076
Implantable Pulse Generator Implant Date: 20230523

## 2024-02-01 NOTE — Addendum Note (Signed)
 Addended by: Elease Etienne A on: 02/01/2024 12:57 PM   Modules accepted: Orders

## 2024-02-01 NOTE — Patient Instructions (Signed)
 Medication Instructions:   Your Physician recommend you continue on your current medication as directed.     *If you need a refill on your cardiac medications before your next appointment, please call your pharmacy*   Lab Work: None ordered.   If you have labs (blood work) drawn today and your tests are completely normal, you will receive your results only by: MyChart Message (if you have MyChart) OR A paper copy in the mail If you have any lab test that is abnormal or we need to change your treatment, we will call you to review the results.   Testing/Procedures: None ordered.    Follow-Up: At [facility], you and your health needs are our priority.  As part of our continuing mission to provide you with exceptional heart care, we have created designated Provider Care Teams.  These Care Teams include your primary Cardiologist (physician) and Advanced Practice Providers (APPs -  Physician Assistants and Nurse Practitioners) who all work together to provide you with the care you need, when you need it.  We recommend signing up for the patient portal called "MyChart".  Sign up information is provided on this After Visit Summary.  MyChart is used to connect with patients for Virtual Visits (Telemedicine).  Patients are able to view lab/test results, encounter notes, upcoming appointments, etc.  Non-urgent messages can be sent to your provider as well.   To learn more about what you can do with MyChart, go to [email or web address].    Your next appointment:   6 month(s)  Provider:   [name], MD or Sherie Don, NP

## 2024-02-01 NOTE — Progress Notes (Signed)
 Remote ICD transmission.

## 2024-02-05 ENCOUNTER — Inpatient Hospital Stay: Payer: Medicare HMO | Attending: Oncology

## 2024-02-05 DIAGNOSIS — R768 Other specified abnormal immunological findings in serum: Secondary | ICD-10-CM

## 2024-02-05 DIAGNOSIS — D696 Thrombocytopenia, unspecified: Secondary | ICD-10-CM | POA: Diagnosis present

## 2024-02-05 LAB — CMP (CANCER CENTER ONLY)
ALT: 14 U/L (ref 0–44)
AST: 25 U/L (ref 15–41)
Albumin: 3.9 g/dL (ref 3.5–5.0)
Alkaline Phosphatase: 70 U/L (ref 38–126)
Anion gap: 7 (ref 5–15)
BUN: 10 mg/dL (ref 8–23)
CO2: 26 mmol/L (ref 22–32)
Calcium: 9.3 mg/dL (ref 8.9–10.3)
Chloride: 104 mmol/L (ref 98–111)
Creatinine: 0.8 mg/dL (ref 0.44–1.00)
GFR, Estimated: >60 mL/min (ref >60)
Glucose, Bld: 139 mg/dL — ABNORMAL HIGH (ref 70–99)
Potassium: 4 mmol/L (ref 3.5–5.1)
Sodium: 137 mmol/L (ref 135–145)
Total Bilirubin: 0.5 mg/dL (ref 0.0–1.2)
Total Protein: 6.8 g/dL (ref 6.5–8.1)

## 2024-02-05 LAB — CBC WITH DIFFERENTIAL (CANCER CENTER ONLY)
Abs Immature Granulocytes: 0.01 10*3/uL (ref 0.00–0.07)
Basophils Absolute: 0 10*3/uL (ref 0.0–0.1)
Basophils Relative: 0 %
Eosinophils Absolute: 0.1 10*3/uL (ref 0.0–0.5)
Eosinophils Relative: 2 %
HCT: 37.1 % (ref 36.0–46.0)
Hemoglobin: 11.8 g/dL — ABNORMAL LOW (ref 12.0–15.0)
Immature Granulocytes: 0 %
Lymphocytes Relative: 35 %
Lymphs Abs: 1.1 10*3/uL (ref 0.7–4.0)
MCH: 29.4 pg (ref 26.0–34.0)
MCHC: 31.8 g/dL (ref 30.0–36.0)
MCV: 92.3 fL (ref 80.0–100.0)
Monocytes Absolute: 0.3 10*3/uL (ref 0.1–1.0)
Monocytes Relative: 11 %
Neutro Abs: 1.6 10*3/uL — ABNORMAL LOW (ref 1.7–7.7)
Neutrophils Relative %: 52 %
Platelet Count: 89 10*3/uL — ABNORMAL LOW (ref 150–400)
RBC: 4.02 MIL/uL (ref 3.87–5.11)
RDW: 13.2 % (ref 11.5–15.5)
WBC Count: 3.1 10*3/uL — ABNORMAL LOW (ref 4.0–10.5)
nRBC: 0 % (ref 0.0–0.2)

## 2024-02-05 LAB — FOLATE: Folate: 24 ng/mL (ref >5.9)

## 2024-02-05 LAB — IMMATURE PLATELET FRACTION: Immature Platelet Fraction: 6.9 % (ref 1.2–8.6)

## 2024-02-05 LAB — VITAMIN B12: Vitamin B-12: 463 pg/mL (ref 180–914)

## 2024-02-12 ENCOUNTER — Encounter: Payer: Self-pay | Admitting: Oncology

## 2024-02-12 ENCOUNTER — Inpatient Hospital Stay: Payer: Medicare HMO | Attending: Oncology | Admitting: Oncology

## 2024-02-12 VITALS — BP 120/71 | HR 79 | Temp 96.0°F | Resp 18 | Wt 112.4 lb

## 2024-02-12 DIAGNOSIS — M2550 Pain in unspecified joint: Secondary | ICD-10-CM | POA: Insufficient documentation

## 2024-02-12 DIAGNOSIS — Z8042 Family history of malignant neoplasm of prostate: Secondary | ICD-10-CM | POA: Insufficient documentation

## 2024-02-12 DIAGNOSIS — E785 Hyperlipidemia, unspecified: Secondary | ICD-10-CM | POA: Diagnosis not present

## 2024-02-12 DIAGNOSIS — Z8616 Personal history of COVID-19: Secondary | ICD-10-CM | POA: Diagnosis not present

## 2024-02-12 DIAGNOSIS — E039 Hypothyroidism, unspecified: Secondary | ICD-10-CM | POA: Insufficient documentation

## 2024-02-12 DIAGNOSIS — I420 Dilated cardiomyopathy: Secondary | ICD-10-CM | POA: Insufficient documentation

## 2024-02-12 DIAGNOSIS — E119 Type 2 diabetes mellitus without complications: Secondary | ICD-10-CM | POA: Insufficient documentation

## 2024-02-12 DIAGNOSIS — Z79899 Other long term (current) drug therapy: Secondary | ICD-10-CM | POA: Diagnosis not present

## 2024-02-12 DIAGNOSIS — D696 Thrombocytopenia, unspecified: Secondary | ICD-10-CM | POA: Diagnosis present

## 2024-02-12 DIAGNOSIS — K219 Gastro-esophageal reflux disease without esophagitis: Secondary | ICD-10-CM | POA: Diagnosis not present

## 2024-02-12 DIAGNOSIS — Z87891 Personal history of nicotine dependence: Secondary | ICD-10-CM | POA: Insufficient documentation

## 2024-02-12 DIAGNOSIS — Z79890 Hormone replacement therapy (postmenopausal): Secondary | ICD-10-CM | POA: Diagnosis not present

## 2024-02-12 DIAGNOSIS — I509 Heart failure, unspecified: Secondary | ICD-10-CM | POA: Diagnosis not present

## 2024-02-12 DIAGNOSIS — R0602 Shortness of breath: Secondary | ICD-10-CM | POA: Insufficient documentation

## 2024-02-12 DIAGNOSIS — Z7984 Long term (current) use of oral hypoglycemic drugs: Secondary | ICD-10-CM | POA: Insufficient documentation

## 2024-02-12 MED ORDER — VITAMIN B-12 1000 MCG PO TABS: 1000.0000 ug | ORAL_TABLET | Freq: Every day | ORAL | 1 refills | Status: AC

## 2024-02-12 NOTE — Progress Notes (Signed)
 Hematology/Oncology Progress note [phone] [phone]) [phone]        CHIEF COMPLAINTS/REASON FOR VISIT:  Evaluation of thrombocytopenia  ASSESSMENT & PLAN:   Thrombocytopenia (HCC) Chronic thrombocytopenia, likely ITP  Previous work up includes normal spleen size on [place].  Adequate B12, folate,normal immature platelet fraction, no M protein on myeloma panel, normal light chain ratio, elevated LDH, negative hepatitis panel, negative HIV B cell arrangement assay showed a clonal B-cell population with an IgH rearrangement. Discussed with pathology, this could be a pseudo clone.  Bone marrow biopsy showed no obvious explanation of thrombocytopenia.  Likely ITP, continue observation.     Orders Placed This Encounter  Procedures   CBC with Differential (Cancer Center Only)    Standing Status:   Future    Expected Date:   06/13/2024    Expiration Date:   02/11/2025   Vitamin B12    Standing Status:   Future    Expected Date:   06/13/2024    Expiration Date:   02/11/2025   Iron and TIBC    Standing Status:   Future    Expected Date:   06/13/2024    Expiration Date:   02/11/2025   Ferritin    Standing Status:   Future    Expected Date:   06/13/2024    Expiration Date:   02/11/2025   Retic Panel    Standing Status:   Future    Expected Date:   06/13/2024    Expiration Date:   02/11/2025   Follow-up 6 months All questions were answered. The patient knows to call the clinic with any problems, questions or concerns.  Rickard Patience, MD, PhD [facility] Hematology Oncology 02/12/2024    HISTORY OF PRESENTING ILLNESS:  Bethany [name] is a 78 y.o. female who was seen in consultation at the request of Sherrie Mustache, Demetrios Isaacs, MD for evaluation of thrombocytopenia Patient has a long standing thrombocytopenia history since at least 2014. Her counts fluctuates, ranging from 90s to 120s.  Denies weight loss, fever, chills, fatigue, night sweats.  Denies hematochezia, hematuria, hematemesis,  epistaxis, black tarry stool.  Patient has no easy bruising.    Denies history hepatitis or HIV infection Denies history of chronic liver disease Denies routine alcohol consumption. Denies dietary restrictions.   INTERVAL HISTORY Bethany [name] is a 78 y.o. female who has above history reviewed by me today presents for follow up visit for thrombocytopenia She presents to discuss results.  No new complaints.  She recently retired COVID 19 infection in February 2025.  She reports feeling fatigued. Occasional joint pain, denies any morning stiffness.    MEDICAL HISTORY:  Past Medical History:  Diagnosis Date   AICD (automatic cardioverter/defibrillator) present    Anemia    Anxiety    CHF (congestive heart failure) (HCC)    Complication of anesthesia    Depression    Diabetes (HCC)    Dilated cardiomyopathy (HCC)    Diverticulosis    Dyspnea    Dysrhythmia    Edema    FEET/ANKLES OCCAS   GERD (gastroesophageal reflux disease)    Hyperlipidemia    Hypothyroidism    LV dysfunction    Orthopnea    USES 3 PILLOWS   PONV (postoperative nausea and vomiting)    Reflux    Thrombocytopenia (HCC)    Thyroid disease    VHD (valvular heart disease)     SURGICAL HISTORY: Past Surgical History:  Procedure Laterality Date   ABDOMINAL HYSTERECTOMY  BI-VENTRICULAR IMPLANTABLE CARDIOVERTER DEFIBRILLATOR [name] 02/11/2015   Procedure: BI-VENTRICULAR IMPLANTABLE CARDIOVERTER DEFIBRILLATOR  (CRT-D);  Surgeon: Duke Salvia, MD;  Location: [facility] CATH LAB;  Service: Cardiovascular;  Laterality: [name];   BONE MARROW BIOPSY  2007   spine   BREAST BIOPSY Right    neg   BREAST EXCISIONAL BIOPSY Right    CARDIAC CATHETERIZATION     CATARACT EXTRACTION W/PHACO Right 01/03/2017   Procedure: CATARACT EXTRACTION PHACO AND INTRAOCULAR LENS PLACEMENT (IOC);  Surgeon: Lockie Mola, MD;  Location: [facility] ORS;  Service: Ophthalmology;  Laterality: Right;  [place] 00:50 AP% 19.5 CDE 9.94 fluid pack  lot # [id number]   CATARACT EXTRACTION W/PHACO Left 10/27/2016   Procedure: CATARACT EXTRACTION PHACO AND INTRAOCULAR LENS PLACEMENT (IOC);  Surgeon: Lockie Mola, MD;  Location: [facility] ORS;  Service: Ophthalmology;  Laterality: Left;  [place]  01:12 AP%  21.0 CDE  11.59 Fluid pack lot # [id number]   CHOLECYSTECTOMY     ICD GENERATOR CHANGEOUT [name] 03/29/2022   Procedure: ICD GENERATOR CHANGEOUT;  Surgeon: Duke Salvia, MD;  Location: [facility] INVASIVE CV LAB;  Service: Cardiovascular;  Laterality: [name];   INSERT / REPLACE / REMOVE PACEMAKER     AICD   IR BONE MARROW BIOPSY & ASPIRATION  07/06/2023   LEAD REVISION [name] 02/11/2015   Procedure: LEAD REVISION;  Surgeon: Duke Salvia, MD;  Location: [facility] C[facility] LAB;  Service: Cardiovascular;  Laterality: [name];   PARTIAL HYSTERECTOMY  1977   TOOTH EXTRACTION  09/23/2015    SOCIAL HISTORY: Social History   Socioeconomic History   Marital status: Divorced    Spouse name: Not on file   Number of children: 4   Years of education: Not on file   Highest education level: GED or equivalent  Occupational History   Occupation: retired  Tobacco Use   Smoking status: Never   Smokeless tobacco: Former    Types: Snuff    Quit date: 10/27/1969  Vaping Use   Vaping status: Never Used  Substance and Sexual Activity   Alcohol use: No    Alcohol/week: 0.0 standard drinks of alcohol   Drug use: No   Sexual activity: Not on file  Other Topics Concern   Not on file  Social History Narrative   Not on file   Social Drivers of Health   Financial Resource Strain: Medium Risk (01/12/2024)   Overall Financial Resource Strain (CARDIA)    Difficulty of Paying Living Expenses: Somewhat hard  Food Insecurity: No Food Insecurity (01/12/2024)   Hunger Vital Sign    Worried About Running Out of Food in the Last Year: Never true    Ran Out of Food in the Last Year: Never true  Transportation Needs: No Transportation Needs (01/12/2024)   PRAPARE - Therapist, art (Medical): No    Lack of Transportation (Non-Medical): No  Physical Activity: Inactive (01/12/2024)   Exercise Vital Sign    Days of Exercise per Week: 0 days    Minutes of Exercise per Session: 30 min  Stress: No Stress Concern Present (01/12/2024)   [employer] of Occupational Health - Occupational Stress Questionnaire    Feeling of Stress : Only a little  Social Connections: Moderately Integrated (01/12/2024)   Social Connection and Isolation Panel [NHANES]    Frequency of Communication with Friends and Family: More than three times a week    Frequency of Social Gatherings with Friends and Family: More than three times a week  Attends Religious Services: More than 4 times per year    Active Member of Clubs or Organizations: Yes    Attends Banker Meetings: More than 4 times per year    Marital Status: Divorced  Intimate Partner Violence: Not At Risk (12/27/2023)   Humiliation, Afraid, Rape, and Kick questionnaire    Fear of Current or Ex-Partner: No    Emotionally Abused: No    Physically Abused: No    Sexually Abused: No    FAMILY HISTORY: Family History  Problem Relation Age of Onset   Heart attack Mother    Prostate cancer Father    Ulcers Father    Heart disease Sister    Stroke Sister    Heart attack Sister    Diabetes Sister    Heart disease Sister    Diabetes Sister    Heart attack Sister    Diabetes Sister    Diabetes Sister    Diabetes Sister    Diabetes Brother    Heart attack Brother    Stroke Brother    Breast cancer Neg Hx     ALLERGIES:  is allergic to other and sulfa antibiotics.  MEDICATIONS:  Current Outpatient Medications  Medication Sig Dispense Refill   acetaminophen (TYLENOL) 500 MG tablet Take 500 mg by mouth at bedtime as needed.     Alcohol Swabs (DROPSAFE ALCOHOL PREP) 70 % PADS USE ONE TIME DAILY 100 each 3   atorvastatin (LIPITOR) 20 MG tablet TAKE 1 TABLET EVERY DAY 90 tablet 3   Blood Glucose  Calibration (TRUE METRIX LEVEL 1) Low SOLN Use as directed with glucose meter 1 each 3   cetirizine (ZYRTEC ALLERGY) 10 MG tablet Take 1 tablet (10 mg total) by mouth daily. 10 tablet 0   cyanocobalamin (VITAMIN B12) 1000 MCG tablet Take 1 tablet (1,000 mcg total) by mouth daily. 90 tablet 1   cyclobenzaprine (FLEXERIL) 5 MG tablet Take 5 mg by mouth 3 (three) times daily as needed.     dapagliflozin propanediol (FARXIGA) 10 MG TABS tablet Take 1 tablet (10 mg total) by mouth daily. 90 tablet 3   ferrous sulfate 325 (65 FE) MG tablet Take 325 mg by mouth 2 (two) times daily with a meal.     furosemide (LASIX) 40 MG tablet Take 1 tablet (40 mg) by mouth every other day (Patient taking differently: Take 40 mg by mouth as needed for fluid or edema. Take 1 tablet (40 mg) as needed) 30 tablet    guaiFENesin-codeine 100-10 MG/5ML syrup Take 5 mLs by mouth 3 (three) times daily as needed for cough. 120 mL 0   JANUMET XR [phone] MG TB24 TAKE 1 TABLET EVERY DAY (NEED MD APPOINTMENT FOR REFILLS) 90 tablet 3   levothyroxine (SYNTHROID) 50 MCG tablet Take 1 tablet (50 mcg total) by mouth daily before breakfast. 90 tablet 3   meclizine (ANTIVERT) 25 MG tablet Take 1 tablet (25 mg total) by mouth 3 (three) times daily as needed. 15 tablet 0   Multiple Vitamin (MULTIVITAMIN) tablet Take 1 tablet by mouth daily.     pantoprazole (PROTONIX) 40 MG tablet TAKE 1 TABLET EVERY DAY 90 tablet 3   sacubitril-valsartan (ENTRESTO) 24-26 MG Take 0.5 tablets by mouth 2 (two) times daily.     senna (SENOKOT) 8.6 MG tablet Take 1 tablet by mouth daily as needed.     TRUE METRIX BLOOD GLUCOSE TEST test strip CHECK BLOOD SUGAR EVERY DAY 100 strip 3   TRUEplus Lancets 33G  MISC TEST BLOOD SUGAR ONE TIME DAILY 100 each 1   No current facility-administered medications for this visit.    Review of Systems  Constitutional:  Positive for fatigue. Negative for appetite change, chills and fever.  HENT:   Negative for hearing loss  and voice change.   Eyes:  Negative for eye problems.  Respiratory:  Negative for chest tightness and cough.   Cardiovascular:  Negative for chest pain.  Gastrointestinal:  Negative for abdominal distention, abdominal pain and blood in stool.  Endocrine: Negative for hot flashes.  Genitourinary:  Negative for difficulty urinating and frequency.   Musculoskeletal:  Negative for arthralgias.  Skin:  Negative for itching and rash.  Neurological:  Negative for extremity weakness.  Hematological:  Negative for adenopathy.  Psychiatric/Behavioral:  Negative for confusion.     PHYSICAL EXAMINATION: ECOG PERFORMANCE STATUS: 0 - Asymptomatic Vitals:   02/12/24 1033  BP: 120/71  Pulse: 79  Resp: 18  Temp: (!) 96 F (35.6 C)  SpO2: 100%   Filed Weights   02/12/24 1033  Weight: 112 lb 6.4 oz (51 kg)    Physical Exam Constitutional:      General: She is not in acute distress. HENT:     Head: Normocephalic and atraumatic.  Eyes:     General: No scleral icterus. Cardiovascular:     Rate and Rhythm: Normal rate and regular rhythm.  Pulmonary:     Effort: Pulmonary effort is normal. No respiratory distress.  Abdominal:     General: Bowel sounds are normal. There is no distension.     Palpations: Abdomen is soft.  Musculoskeletal:        General: Normal range of motion.     Cervical back: Normal range of motion and neck supple.  Skin:    Findings: No erythema or rash.  Neurological:     Mental Status: She is alert and oriented to person, place, and time. Mental status is at baseline.  Psychiatric:        Mood and Affect: Mood normal.      LABORATORY DATA:  I have reviewed the data as listed    Latest Ref Rng & Units 02/05/2024    8:54 AM 01/04/2024    3:08 PM 08/08/2023   10:45 AM  CBC  WBC 4.0 - 10.5 K/uL 3.1  5.5  3.7   Hemoglobin 12.0 - 15.0 g/dL 16.1  09.6  04.5   Hematocrit 36.0 - 46.0 % 37.1  36.6  38.3   Platelets 150 - 400 K/uL 89  70  88       Latest Ref Rng  & Units 02/05/2024    8:54 AM 01/15/2024    9:46 AM 01/04/2024    3:08 PM  CMP  Glucose 70 - 99 mg/dL 409  811  914   BUN 8 - 23 mg/dL 10  8  14    Creatinine 0.44 - 1.00 mg/dL 7.82  9.56  2.13   Sodium 135 - 145 mmol/L 137  139  135   Potassium 3.5 - 5.1 mmol/L 4.0  4.7  2.9   Chloride 98 - 111 mmol/L 104  101  99   CO2 22 - 32 mmol/L 26  25  23    Calcium 8.9 - 10.3 mg/dL 9.3  9.5  9.1   Total Protein 6.5 - 8.1 g/dL 6.8   6.9   Total Bilirubin 0.0 - 1.2 mg/dL 0.5   1.0   Alkaline Phos 38 - 126 U/L 70  61   AST 15 - 41 U/L 25   27   ALT 0 - 44 U/L 14   17      RADIOGRAPHIC STUDIES: I have personally reviewed the radiological images as listed and agreed with the findings in the report.  CUP PACEART INCLINIC DEVICE CHECK Result Date: 02/01/2024 Normal in-clinic CRT-D (multi-lead) check. Presenting Rhythm: _AS-VP__ . Routine testing was performed. Thresholds, sensing, impedance trend were stable. LV threshold slightly elevated compared to prior. HF diagnostics are stable. No treated arrhythmias.  Patient BiV pacing _98_% of the time. Estimated longevity __5.3 years__ . Pt enrolled in remote follow-up. Percell Belt, NP  DG Chest 2 View Result Date: 01/04/2024 CLINICAL DATA:  Cough. EXAM: CHEST - 2 VIEW COMPARISON:  August 02, 2023. FINDINGS: Stable cardiomediastinal silhouette. Left-sided defibrillator is unchanged. Both lungs are clear. The visualized skeletal structures are unremarkable. IMPRESSION: No active cardiopulmonary disease. Electronically Signed   By: Lupita Raider M.D.   On: 01/04/2024 17:36   CUP PACEART REMOTE DEVICE CHECK Result Date: 12/27/2023 Scheduled remote reviewed. Normal device function.  There was a 43 second atrial fib arrhythmia detected.  There was no short NSVT arrhythmia detected Next remote 91 days. ML, CVRS

## 2024-02-12 NOTE — Assessment & Plan Note (Signed)
 Chronic thrombocytopenia, likely ITP  Previous work up includes normal spleen size on [place].  Adequate B12, folate,normal immature platelet fraction, no M protein on myeloma panel, normal light chain ratio, elevated LDH, negative hepatitis panel, negative HIV B cell arrangement assay showed a clonal B-cell population with an IgH rearrangement. Discussed with pathology, this could be a pseudo clone.  Bone marrow biopsy showed no obvious explanation of thrombocytopenia.  Likely ITP, continue observation.

## 2024-03-05 ENCOUNTER — Ambulatory Visit: Admitting: Podiatry

## 2024-03-05 ENCOUNTER — Encounter: Payer: Self-pay | Admitting: Podiatry

## 2024-03-05 DIAGNOSIS — L600 Ingrowing nail: Secondary | ICD-10-CM | POA: Diagnosis not present

## 2024-03-05 NOTE — Progress Notes (Signed)
 Chief Complaint  Patient presents with   Nail Problem    "I have an ingrown toenail.  It hurts." N - toenail hurts L - hallux right D - 2 years O - off and on C - tender, sore, Diabetic A - certain shoes T - none    Subjective: Patient presents today for evaluation of pain to the medial border of the right great toe. Patient is concerned for possible ingrown nail.  It is very sensitive to touch.  Patient presents today for further treatment and evaluation.  Past Medical History:  Diagnosis Date   AICD (automatic cardioverter/defibrillator) present    Anemia    Anxiety    CHF (congestive heart failure) (HCC)    Complication of anesthesia    Depression    Diabetes (HCC)    Dilated cardiomyopathy (HCC)    Diverticulosis    Dyspnea    Dysrhythmia    Edema    FEET/ANKLES OCCAS   GERD (gastroesophageal reflux disease)    Hyperlipidemia    Hypothyroidism    LV dysfunction    Orthopnea    USES 3 PILLOWS   PONV (postoperative nausea and vomiting)    Reflux    Thrombocytopenia (HCC)    Thyroid  disease    VHD (valvular heart disease)     Past Surgical History:  Procedure Laterality Date   ABDOMINAL HYSTERECTOMY     BI-VENTRICULAR IMPLANTABLE CARDIOVERTER DEFIBRILLATOR [name] 02/11/2015   Procedure: BI-VENTRICULAR IMPLANTABLE CARDIOVERTER DEFIBRILLATOR  (CRT-D);  Surgeon: Verona Goodwill, MD;  Location: [facility] CATH LAB;  Service: Cardiovascular;  Laterality: [name];   BONE MARROW BIOPSY  2007   spine   BREAST BIOPSY Right    neg   BREAST EXCISIONAL BIOPSY Right    CARDIAC CATHETERIZATION     CATARACT EXTRACTION W/PHACO Right 01/03/2017   Procedure: CATARACT EXTRACTION PHACO AND INTRAOCULAR LENS PLACEMENT (IOC);  Surgeon: Annell Kidney, MD;  Location: [facility] ORS;  Service: Ophthalmology;  Laterality: Right;  US  00:50 AP% 19.5 CDE 9.94 fluid pack lot # [id number]   CATARACT EXTRACTION W/PHACO Left 10/27/2016   Procedure: CATARACT EXTRACTION PHACO AND INTRAOCULAR LENS  PLACEMENT (IOC);  Surgeon: Annell Kidney, MD;  Location: [facility] ORS;  Service: Ophthalmology;  Laterality: Left;  US   01:12 AP%  21.0 CDE  11.59 Fluid pack lot # [id number]   CHOLECYSTECTOMY     ICD GENERATOR CHANGEOUT [name] 03/29/2022   Procedure: ICD GENERATOR CHANGEOUT;  Surgeon: Verona Goodwill, MD;  Location: [facility] INVAS[facility] CV LAB;  Service: Cardiovascular;  Laterality: [name];   INSERT / REPLACE / REMOVE PACEMAKER     AICD   IR BONE MARROW BIOPSY & ASPIRATION  07/06/2023   LEAD REVISION [name] 02/11/2015   Procedure: LEAD REVISION;  Surgeon: Verona Goodwill, MD;  Location: [facility] CATH LAB;  Service: Cardiovascular;  Laterality: [name];   PARTIAL HYSTERECTOMY  1977   TOOTH EXTRACTION  09/23/2015    Allergies  Allergen Reactions   Other Other (See Comments)    NO BLOOD PRODUCTS- JEHOVAHS WITNESS   Sulfa Antibiotics Other (See Comments)    Loss of taste    Objective:  General: Well developed, nourished, in no acute distress, alert and oriented x3   Dermatology: Skin is warm, dry and supple bilateral.  Medial border right great toe is tender with evidence of an ingrowing nail. Pain on palpation noted to the border of the nail fold. The remaining nails appear unremarkable at this time.   Vascular: DP and PT pulses palpable.  No clinical evidence of vascular compromise  Neruologic: Grossly intact via light touch bilateral.  Musculoskeletal: No pedal deformity noted  Assesement: #1 Paronychia with ingrowing nail medial border right great toe  Plan of Care:  -Patient evaluated.  -Discussed treatment alternatives and plan of care. Explained nail avulsion procedure and post procedure course to patient. -Patient opted for permanent partial nail avulsion of the ingrown portion of the nail.  -Prior to procedure, local anesthesia infiltration utilized using 3 ml of a 50:50 mixture of 2% plain lidocaine  and 0.5% plain marcaine in a normal hallux block fashion and a betadine  prep performed.   -Partial permanent nail avulsion with chemical matrixectomy performed using 3x30sec applications of phenol followed by alcohol flush.  -Light dressing applied.  Post care instructions provided -Return to clinic 3 weeks  Dot Gazella, DPM [facility]  Dr. Dot Gazella, DPM    [address] [place], [place] [address]                Office [phone]  Fax [phone]

## 2024-03-25 ENCOUNTER — Other Ambulatory Visit: Payer: Self-pay | Admitting: Family Medicine

## 2024-03-25 DIAGNOSIS — I5022 Chronic systolic (congestive) heart failure: Secondary | ICD-10-CM

## 2024-03-25 DIAGNOSIS — E119 Type 2 diabetes mellitus without complications: Secondary | ICD-10-CM

## 2024-03-25 NOTE — Telephone Encounter (Signed)
 Copied from CRM [id number]. Topic: Clinical - Medication Refill >> Mar 25, 2024  8:08 AM Jalayah J wrote: Medication: dapagliflozin  propanediol (FARXIGA ) 10 MG TABS tablet  Has the patient contacted their pharmacy? Yes (Agent: If no, request that the patient contact the pharmacy for the refill. If patient does not wish to contact the pharmacy document the reason why and proceed with request.) (Agent: If yes, when and what did the pharmacy advise?)  This is the patient's preferred pharmacy:  [facility] - [place], [place] - [address] [place] [address] Phone: [phone] Fax: [phone]    Is this the correct pharmacy for this prescription? Yes If no, delete pharmacy and type the correct one.   Has the prescription been filled recently? No  Is the patient out of the medication? Yes  Has the patient been seen for an appointment in the last year OR does the patient have an upcoming appointment? Yes  Can we respond through MyChart? Yes  Agent: Please be advised that Rx refills may take up to 3 business days. We ask that you follow-up with your pharmacy.

## 2024-03-26 ENCOUNTER — Encounter: Payer: Self-pay | Admitting: Podiatry

## 2024-03-26 ENCOUNTER — Ambulatory Visit: Admitting: Podiatry

## 2024-03-26 ENCOUNTER — Ambulatory Visit (INDEPENDENT_AMBULATORY_CARE_PROVIDER_SITE_OTHER): Payer: Medicare HMO

## 2024-03-26 DIAGNOSIS — L600 Ingrowing nail: Secondary | ICD-10-CM

## 2024-03-26 DIAGNOSIS — I428 Other cardiomyopathies: Secondary | ICD-10-CM

## 2024-03-26 NOTE — Progress Notes (Signed)
   Chief Complaint  Patient presents with   Ingrown Toenail    "It's not healed up yet but I guess it's getting there."    Subjective: 78 y.o. female presents today status post permanent nail avulsion procedure of the medial border right great toe that was performed on 03/05/2024.  Patient doing well.  Overall improvement.  She continues to have some very slight tenderness.   Past Medical History:  Diagnosis Date   AICD (automatic cardioverter/defibrillator) present    Anemia    Anxiety    CHF (congestive heart failure) (HCC)    Complication of anesthesia    Depression    Diabetes (HCC)    Dilated cardiomyopathy (HCC)    Diverticulosis    Dyspnea    Dysrhythmia    Edema    FEET/ANKLES OCCAS   GERD (gastroesophageal reflux disease)    Hyperlipidemia    Hypothyroidism    LV dysfunction    Orthopnea    USES 3 PILLOWS   PONV (postoperative nausea and vomiting)    Reflux    Thrombocytopenia (HCC)    Thyroid  disease    VHD (valvular heart disease)     Objective: Neurovascular status intact.  Skin is warm, dry and supple. Nail and respective nail fold appears to be healing appropriately.   Assessment: #1 s/p partial permanent nail matrixectomy medial border right great toe   Plan of care: #1 patient was evaluated  #2 light debridement of the periungual debris was performed to the border of the respective toe and nail plate using a tissue nipper. #3 patient is to return to clinic on a PRN basis.   Dot Gazella, DPM [facility]  Dr. Dot Gazella, DPM    [address] [place], [place] [address]                Office [phone]  Fax [phone]

## 2024-03-27 LAB — CUP PACEART REMOTE DEVICE CHECK
Battery Remaining Longevity: 64 mo
Battery Voltage: 2.98 V
Brady Statistic AP VP Percent: 0.03 %
Brady Statistic AP VS Percent: 0.01 %
Brady Statistic AS VP Percent: 98.73 %
Brady Statistic AS VS Percent: 1.23 %
Brady Statistic RA Percent Paced: 0.04 %
Brady Statistic RV Percent Paced: 7.87 %
Date Time Interrogation Session: 20250520063624
HighPow Impedance: 48 Ohm
Implantable Lead Connection Status: 753985
Implantable Lead Connection Status: 753985
Implantable Lead Connection Status: 753985
Implantable Lead Implant Date: 20160409
Implantable Lead Implant Date: 20160409
Implantable Lead Implant Date: 20160409
Implantable Lead Location: 753858
Implantable Lead Location: 753859
Implantable Lead Location: 753860
Implantable Lead Model: 4598
Implantable Lead Model: 5076
Implantable Pulse Generator Implant Date: 20230523
Lead Channel Impedance Value: 1140 Ohm
Lead Channel Impedance Value: 1178 Ohm
Lead Channel Impedance Value: 1178 Ohm
Lead Channel Impedance Value: 274.19 Ohm
Lead Channel Impedance Value: 274.19 Ohm
Lead Channel Impedance Value: 296.578
Lead Channel Impedance Value: 320.485
Lead Channel Impedance Value: 320.485
Lead Channel Impedance Value: 342 Ohm
Lead Channel Impedance Value: 361 Ohm
Lead Channel Impedance Value: 475 Ohm
Lead Channel Impedance Value: 513 Ohm
Lead Channel Impedance Value: 589 Ohm
Lead Channel Impedance Value: 589 Ohm
Lead Channel Impedance Value: 703 Ohm
Lead Channel Impedance Value: 931 Ohm
Lead Channel Impedance Value: 988 Ohm
Lead Channel Impedance Value: 988 Ohm
Lead Channel Pacing Threshold Amplitude: 0.375 V
Lead Channel Pacing Threshold Amplitude: 0.875 V
Lead Channel Pacing Threshold Amplitude: 1.875 V
Lead Channel Pacing Threshold Pulse Width: 0.4 ms
Lead Channel Pacing Threshold Pulse Width: 0.4 ms
Lead Channel Pacing Threshold Pulse Width: 1 ms
Lead Channel Sensing Intrinsic Amplitude: 0.375 mV
Lead Channel Sensing Intrinsic Amplitude: 0.375 mV
Lead Channel Sensing Intrinsic Amplitude: 8.5 mV
Lead Channel Sensing Intrinsic Amplitude: 8.5 mV
Lead Channel Setting Pacing Amplitude: 1.5 V
Lead Channel Setting Pacing Amplitude: 2 V
Lead Channel Setting Pacing Amplitude: 2.5 V
Lead Channel Setting Pacing Pulse Width: 0.4 ms
Lead Channel Setting Pacing Pulse Width: 1 ms
Lead Channel Setting Sensing Sensitivity: 0.3 mV
Zone Setting Status: 755011

## 2024-03-27 MED ORDER — DAPAGLIFLOZIN PROPANEDIOL 10 MG PO TABS: 10.0000 mg | ORAL_TABLET | Freq: Every day | ORAL | 1 refills | Status: DC

## 2024-03-27 NOTE — Telephone Encounter (Signed)
 Change of pharmacy  Requested Prescriptions  Pending Prescriptions Disp Refills   dapagliflozin  propanediol (FARXIGA ) 10 MG TABS tablet 90 tablet 1    Sig: Take 1 tablet (10 mg total) by mouth daily.     Endocrinology:  Diabetes - SGLT2 Inhibitors Passed - 03/27/2024  9:40 AM      Passed - Cr in normal range and within 360 days    Creatinine  Date Value Ref Range Status  02/05/2024 0.80 0.44 - 1.00 mg/dL Final  16/08/9603 5.40 mg/dL Final    Comment:    9.81-1.91 NOTE: New Reference Range  01/13/15    Creatinine, POC  Date Value Ref Range Status  12/04/2018 n/a mg/dL Final         Passed - HBA1C is between 0 and 7.9 and within 180 days    Hemoglobin A1C  Date Value Ref Range Status  01/15/2024 7.0 (A) 4.0 - 5.6 % Final  02/20/2014 6.3 4.2 - 6.3 % Final    Comment:    The American Diabetes Association recommends that a primary goal of therapy should be <7% and that physicians should reevaluate the treatment regimen in patients with HbA1c values consistently >8%.    Hgb A1c MFr Bld  Date Value Ref Range Status  08/01/2023 7.4 (H) 4.8 - 5.6 % Final    Comment:             Prediabetes: 5.7 - 6.4          Diabetes: >6.4          Glycemic control for adults with diabetes: <7.0          Passed - eGFR in normal range and within 360 days    EGFR (African American)  Date Value Ref Range Status  02/04/2015 >60  Final   GFR calc Af Amer  Date Value Ref Range Status  07/01/2020 79 >59 mL/min/1.73 Final    Comment:    **[facility] currently reports eGFR in compliance with the current**   recommendations of the [employer]. [facility] will   update reporting as new guidelines are published from the NKF-ASN   Task force.    EGFR (Non-African Amer.)  Date Value Ref Range Status  02/04/2015 >60  Final    Comment:    eGFR values <22mL/min/1.73 m2 may be an indication of chronic kidney disease (CKD). Calculated eGFR is useful in patients with stable renal  function. The eGFR calculation will not be reliable in acutely ill patients when serum creatinine is changing rapidly. It is not useful in patients on dialysis. The eGFR calculation may not be applicable to patients at the low and high extremes of body sizes, pregnant women, and vegetarians.    GFR, Estimated  Date Value Ref Range Status  02/05/2024 >60 >60 mL/min Final    Comment:    (NOTE) Calculated using the CKD-EPI Creatinine Equation (2021)    GFR  Date Value Ref Range Status  02/05/2015 88.96 >60.00 mL/min Final   eGFR  Date Value Ref Range Status  01/15/2024 81 >59 mL/min/1.73 Final         Passed - Valid encounter within last 6 months    Recent Outpatient Visits           2 months ago Type 2 diabetes mellitus with other specified complication, without long-term current use of insulin (HCC)   [facility] Lamon Pillow, MD   2 months ago COVID-19   [facility], [place]in Buzzard, NP       Future Appointments             In 1 month Fisher, Erlinda Haws, MD [facility], [facility]

## 2024-03-28 ENCOUNTER — Ambulatory Visit: Payer: Self-pay | Admitting: Cardiology

## 2024-04-15 ENCOUNTER — Telehealth: Payer: Self-pay

## 2024-04-15 NOTE — Telephone Encounter (Signed)
 Medicare only allows once a day unless on insulin

## 2024-04-15 NOTE — Telephone Encounter (Signed)
 LMTCB

## 2024-04-15 NOTE — Telephone Encounter (Signed)
 Patient advised.

## 2024-04-15 NOTE — Telephone Encounter (Signed)
 Copied from CRM [id number]. Topic: Clinical - Medication Question >> Apr 15, 2024 10:50 AM Bethany [name] wrote: Reason for CRM: Patient calling to ask if Dr Shann Darnel could increase her medication so she could test more often maybe twice a day, pt states her blood sugar is went up Pt number [phone] (M) TRUE METRIX BLOOD GLUCOSE TEST test strip  [facility] - [place], [place] - [address] [place] [address] Phone: [phone] Fax: [phone]

## 2024-04-23 ENCOUNTER — Ambulatory Visit: Payer: Self-pay

## 2024-04-23 NOTE — Telephone Encounter (Addendum)
 FYI Only or Action Required?: FYI only for provider  Patient was last seen in primary care on 01/15/2024 by Lamon Pillow, MD. Called Nurse Triage reporting Back Pain. Symptoms began a week ago. Interventions attempted: OTC medications: tylenol  extra strength. Symptoms are: gradually worsening.  Triage Disposition: See PCP When Office is Open (Within 3 Days)  Patient/caregiver understands and will follow disposition?: Yes     Copied from CRM [id number]. Topic: Clinical - Red Word Triage >> Apr 23, 2024  9:34 AM Ethelle Herb L wrote: Red Word that prompted transfer to Nurse Triage: lower back pain, in the center of spine, nauseous due to the pain Answer Assessment - Initial Assessment Questions 1. ONSET: When did the pain begin?      X couple of weeks and worsening 2. LOCATION: Where does it hurt? (upper, mid or lower back)     Top of spine in back 3. SEVERITY: How bad is the pain?  (e.g., Scale 1-10; mild, moderate, or severe)   - MILD (1-3): Doesn't interfere with normal activities.    - MODERATE (4-7): Interferes with normal activities or awakens from sleep.    - SEVERE (8-10): Excruciating pain, unable to do any normal activities.      9/10 4. PATTERN: Is the pain constant? (e.g., yes, no; constant, intermittent)      constant 5. RADIATION: Does the pain shoot into your legs or somewhere else?     no 6. CAUSE:  What do you think is causing the back pain?      unknown 7. BACK OVERUSE:  Any recent lifting of heavy objects, strenuous work or exercise?     [name] 8. MEDICINES: What have you taken so far for the pain? (e.g., nothing, acetaminophen , NSAIDS)     Tylenol  without relief 9. NEUROLOGIC SYMPTOMS: Do you have any weakness, numbness, or problems with bowel/bladder control?     Weakness, hot/cold flashes 10. OTHER SYMPTOMS: Do you have any other symptoms? (e.g., fever, abdomen pain, burning with urination, blood in urine)       nausea 11. PREGNANCY: Is there any  chance you are pregnant? When was your last menstrual period?       [name]  Protocols used: Back Pain-A-AH

## 2024-04-23 NOTE — Telephone Encounter (Signed)
 Reason for Disposition  [1] MODERATE back pain (e.g., interferes with normal activities) AND [2] present > 3 days  Protocols used: Back Pain-A-AH   Called [facility] and spoke with Peterson Brandt: Peterson Brandt schedule pt for visit.

## 2024-04-24 ENCOUNTER — Ambulatory Visit: Payer: Self-pay | Admitting: Nurse Practitioner

## 2024-04-24 ENCOUNTER — Ambulatory Visit (INDEPENDENT_AMBULATORY_CARE_PROVIDER_SITE_OTHER): Admitting: Nurse Practitioner

## 2024-04-24 ENCOUNTER — Ambulatory Visit
Admission: RE | Admit: 2024-04-24 | Discharge: 2024-04-24 | Disposition: A | Discharge status: Home / Self Care | Source: Ambulatory Visit | Attending: Nurse Practitioner | Admitting: Nurse Practitioner

## 2024-04-24 ENCOUNTER — Encounter: Payer: Self-pay | Admitting: Nurse Practitioner

## 2024-04-24 VITALS — BP 99/65 | HR 75 | Temp 98.0°F | Ht 61.0 in | Wt 108.0 lb

## 2024-04-24 DIAGNOSIS — M545 Low back pain, unspecified: Secondary | ICD-10-CM

## 2024-04-24 LAB — URINALYSIS, ROUTINE W REFLEX MICROSCOPIC
Bilirubin, UA: NEGATIVE
Ketones, UA: NEGATIVE
Leukocytes,UA: NEGATIVE
Nitrite, UA: NEGATIVE
Protein,UA: NEGATIVE
RBC, UA: NEGATIVE
Specific Gravity, UA: 1.005 (ref 1.005–1.030)
Urobilinogen, Ur: 0.2 mg/dL (ref 0.2–1.0)
pH, UA: 5.5 (ref 5.0–7.5)

## 2024-04-24 MED ORDER — BACLOFEN 10 MG PO TABS
10.0000 mg | ORAL_TABLET | Freq: Every day | ORAL | 0 refills | Status: DC
Start: 2024-04-24 — End: 2024-06-17

## 2024-04-24 NOTE — Progress Notes (Signed)
 BP 99/65   Pulse 75   Temp 98 F (36.7 C) (Oral)   Ht 5' 1 (1.549 m)   Wt 108 lb (49 kg)   LMP  (LMP Unknown)   SpO2 99%   BMI 20.41 kg/m    Subjective:    Patient ID: Bethany [name], female    DOB: 1946-04-17, 78 y.o.   MRN: [id number]  HPI: Bethany [name] is a 78 y.o. female  Chief Complaint  Patient presents with   Back Pain    Started about 2 weeks ago, midback, denies fall or known injury, takes tylenol    BACK PAIN Patient states when she rubs her back it makes her feel nauseous  Duration: 2 weeks Mechanism of injury: unknown Location: midline (in her spine) Onset: gradual Severity: 3/10 Quality: sharp and pulling Frequency: intermittent Radiation: none Aggravating factors: standing for a long time and walking Alleviating factors: tylenol   Status: worse Treatments attempted: tylenol   Relief with NSAIDs?: No NSAIDs Taken Nighttime pain:  yes Paresthesias / decreased sensation:  no Bowel / bladder incontinence:  no Fevers:  no (feels like she is getting some hot flashes) Dysuria / urinary frequency:  no  Relevant past medical, surgical, family and social history reviewed and updated as indicated. Interim medical history since our last visit reviewed. Allergies and medications reviewed and updated.  Review of Systems  Constitutional:  Negative for fever.  Musculoskeletal:  Positive for back pain.    Per HPI unless specifically indicated above     Objective:    BP 99/65   Pulse 75   Temp 98 F (36.7 C) (Oral)   Ht 5' 1 (1.549 m)   Wt 108 lb (49 kg)   LMP  (LMP Unknown)   SpO2 99%   BMI 20.41 kg/m   Wt Readings from Last 3 Encounters:  04/24/24 108 lb (49 kg)  02/12/24 112 lb 6.4 oz (51 kg)  02/01/24 113 lb 3.2 oz (51.3 kg)    Physical Exam Vitals and nursing note reviewed.  Constitutional:      General: She is not in acute distress.    Appearance: Normal appearance. She is normal weight. She is not ill-appearing, toxic-appearing or  diaphoretic.  HENT:     Head: Normocephalic.     Right Ear: External ear normal.     Left Ear: External ear normal.     Nose: Nose normal.     Mouth/Throat:     Mouth: Mucous membranes are moist.     Pharynx: Oropharynx is clear.   Eyes:     General:        Right eye: No discharge.        Left eye: No discharge.     Extraocular Movements: Extraocular movements intact.     Conjunctiva/sclera: Conjunctivae normal.     Pupils: Pupils are equal, round, and reactive to light.    Cardiovascular:     Rate and Rhythm: Normal rate and regular rhythm.     Heart sounds: No murmur heard. Pulmonary:     Effort: Pulmonary effort is normal. No respiratory distress.     Breath sounds: Normal breath sounds. No wheezing or rales.   Musculoskeletal:     Cervical back: Normal range of motion and neck supple.     Lumbar back: No swelling, edema, deformity, signs of trauma, lacerations, spasms, tenderness or bony tenderness. Normal range of motion. Negative right straight leg raise test and negative left straight leg raise test. No scoliosis.  Back:   Skin:    General: Skin is warm and dry.     Capillary Refill: Capillary refill takes less than 2 seconds.   Neurological:     General: No focal deficit present.     Mental Status: She is alert and oriented to person, place, and time. Mental status is at baseline.   Psychiatric:        Mood and Affect: Mood normal.        Behavior: Behavior normal.        Thought Content: Thought content normal.        Judgment: Judgment normal.     Results for orders placed or performed in visit on 03/26/24  CUP PACEART REMOTE DEVICE CHECK   Collection Time: 03/26/24  6:36 AM  Result Value Ref Range   Date Time Interrogation Session [id number]    Pulse Generator Manufacturer MERM    Pulse Gen Model [id number] Claria MRI Quad CRT-D    Pulse Gen Serial Number [id number]    Clinic Name [facility]    Implantable Pulse Generator Type Cardiac  Resynch Therapy Defibulator    Implantable Pulse Generator Implant Date 29528413    Implantable Lead Manufacturer MERM    Implantable Lead Model 4598 Attain Performa S MRI SureScan    Implantable Lead Serial Number [id number]    Implantable Lead Implant Date 24401027    Implantable Lead Location Detail 1 UNKNOWN    Implantable Lead Location [id number]    Implantable Lead Connection Status [id number]    Implantable Lead Manufacturer MERM    Implantable Lead Model 5076 CapSureFix Novus    Implantable Lead Serial Number [id number]    Implantable Lead Implant Date 25366440    Implantable Lead Location Detail 1 UNKNOWN    Implantable Lead Location [id number]    Implantable Lead Connection Status [id number]    Implantable Lead Manufacturer [facility]    Implantable Lead Model [id number] [employer] Quattro Secure S    Implantable Lead Serial Number [id number]    Implantable Lead Implant Date 59563875    Implantable Lead Location Detail 1 UNKNOWN    Implantable Lead Location [id number]    Implantable Lead Connection Status [id number]    Lead Channel Setting Sensing Sensitivity 0.3 mV   Lead Channel Setting Pacing Amplitude 1.5 V   Lead Channel Setting Pacing Pulse Width 0.4 ms   Lead Channel Setting Pacing Amplitude 2 V   Lead Channel Setting Pacing Pulse Width 1 ms   Lead Channel Setting Pacing Amplitude 2.5 V   Lead Channel Setting Pacing Capture Mode Adaptive Capture    Zone Setting Status Active    Zone Setting Status Inactive    Zone Setting Status Inactive    Zone Setting Status 755011    Zone Setting Status Active    Lead Channel Impedance Value 361 ohm   Lead Channel Sensing Intrinsic Amplitude 0.375 mV   Lead Channel Sensing Intrinsic Amplitude 0.375 mV   Lead Channel Pacing Threshold Amplitude 0.375 V   Lead Channel Pacing Threshold Pulse Width 0.4 ms   Lead Channel Impedance Value 475 ohm   Lead Channel Impedance Value 342 ohm   Lead Channel Sensing Intrinsic Amplitude 8.5 mV   Lead Channel Sensing Intrinsic  Amplitude 8.5 mV   Lead Channel Pacing Threshold Amplitude 0.875 V   Lead Channel Pacing Threshold Pulse Width 0.4 ms   HighPow Impedance 48 ohm   Lead Channel Impedance Value 1,178 ohm   Lead Channel Impedance Value 1,178 ohm   Lead  Channel Impedance Value 1,140 ohm   Lead Channel Impedance Value 931 ohm   Lead Channel Impedance Value 988 ohm   Lead Channel Impedance Value 988 ohm   Lead Channel Impedance Value 703 ohm   Lead Channel Impedance Value 589 ohm   Lead Channel Impedance Value 589 ohm   Lead Channel Impedance Value 513 ohm   Lead Channel Impedance Value 320.485    Lead Channel Impedance Value 320.485    Lead Channel Impedance Value 296.578    Lead Channel Impedance Value 274.19 ohm   Lead Channel Impedance Value 274.19 ohm   Lead Channel Pacing Threshold Amplitude 1.875 V   Lead Channel Pacing Threshold Pulse Width 1 ms   Battery Status OK    Battery Remaining Longevity 64 mo   Battery Voltage 2.98 V   Brady Statistic RA Percent Paced 0.04 %   Brady Statistic RV Percent Paced 7.87 %   Brady Statistic AP VP Percent 0.03 %   Brady Statistic AS VP Percent 98.73 %   Brady Statistic AP VS Percent 0.01 %   Brady Statistic AS VS Percent 1.23 %      Assessment & Plan:   Problem List Items Addressed This Visit   None Visit Diagnoses       Acute midline low back pain without sciatica    -  Primary   Will treat with Baclofen .  Will rule out UTI and kidney stone- low supicion given exam. Will order xray for evaluation. Continue with PRN tylenol .   Relevant Medications   baclofen  (LIORESAL ) 10 MG tablet   Other Relevant Orders   Urinalysis, Routine w reflex microscopic   Comp Met (CMET)   DG Lumbar Spine Complete        Follow up plan: No follow-ups on file.

## 2024-04-25 LAB — COMPREHENSIVE METABOLIC PANEL WITH GFR
ALT: 13 IU/L (ref 0–32)
AST: 25 IU/L (ref 0–40)
Albumin: 4.6 g/dL (ref 3.8–4.8)
Alkaline Phosphatase: 85 IU/L (ref 44–121)
BUN/Creatinine Ratio: 12 (ref 12–28)
BUN: 10 mg/dL (ref 8–27)
Bilirubin Total: 0.3 mg/dL (ref 0.0–1.2)
CO2: 23 mmol/L (ref 20–29)
Calcium: 10 mg/dL (ref 8.7–10.3)
Chloride: 100 mmol/L (ref 96–106)
Creatinine, Ser: 0.84 mg/dL (ref 0.57–1.00)
Globulin, Total: 2.5 g/dL (ref 1.5–4.5)
Glucose: 109 mg/dL — ABNORMAL HIGH (ref 70–99)
Potassium: 3.5 mmol/L (ref 3.5–5.2)
Sodium: 142 mmol/L (ref 134–144)
Total Protein: 7.1 g/dL (ref 6.0–8.5)
eGFR: 72 mL/min/{1.73_m2} (ref >59)

## 2024-05-17 ENCOUNTER — Ambulatory Visit: Admitting: Family Medicine

## 2024-05-19 ENCOUNTER — Other Ambulatory Visit: Payer: Self-pay | Admitting: Family Medicine

## 2024-05-19 DIAGNOSIS — E119 Type 2 diabetes mellitus without complications: Secondary | ICD-10-CM

## 2024-05-21 NOTE — Addendum Note (Signed)
 Addended by: VICCI SELLER A on: 05/21/2024 08:36 AM   Modules accepted: Orders

## 2024-05-21 NOTE — Progress Notes (Signed)
 Remote ICD transmission.

## 2024-06-05 ENCOUNTER — Other Ambulatory Visit: Payer: Self-pay | Admitting: Family Medicine

## 2024-06-05 ENCOUNTER — Encounter: Payer: Self-pay | Admitting: Family Medicine

## 2024-06-05 ENCOUNTER — Ambulatory Visit: Admitting: Family Medicine

## 2024-06-05 VITALS — BP 112/63 | HR 95 | Temp 97.3°F | Ht 61.0 in | Wt 110.1 lb

## 2024-06-05 DIAGNOSIS — Z7984 Long term (current) use of oral hypoglycemic drugs: Secondary | ICD-10-CM | POA: Diagnosis not present

## 2024-06-05 DIAGNOSIS — I5022 Chronic systolic (congestive) heart failure: Secondary | ICD-10-CM | POA: Diagnosis not present

## 2024-06-05 DIAGNOSIS — E039 Hypothyroidism, unspecified: Secondary | ICD-10-CM

## 2024-06-05 DIAGNOSIS — E1169 Type 2 diabetes mellitus with other specified complication: Secondary | ICD-10-CM

## 2024-06-05 LAB — POCT GLYCOSYLATED HEMOGLOBIN (HGB A1C): Hemoglobin A1C: 6.7 % — AB (ref 4.0–5.6)

## 2024-06-05 NOTE — Progress Notes (Signed)
 Established patient visit   Patient: Bethany [name]   DOB: 1945-11-25   78 y.o. Female  MRN: [id number] Visit Date: 06/05/2024  Today's healthcare provider: Nancyann Perry, MD   Chief Complaint  Patient presents with   Medical Management of Chronic Issues    T2DM Patient has concerns with blood sugar, states it has been as high as 200 after eating.  Has some concerns regarding blood pressure, states it will run low around 100/60- educated pt on normal blood pressure reading of 120/80 and to let us  know if it gets higher than 140/90    Subjective    HPI Patient with type 2 diabetes and chf presents for follow up dm2. Generally feels well. No complains. States cardiology took her off of scheduled furosemide  but advised she could take only as needed, which she has not needed to do. No swelling since stopping it. Tolerating current medications well.   Lab Results  Component Value Date   HGBA1C 6.7 (A) 06/05/2024   HGBA1C 7.0 (A) 01/15/2024   HGBA1C 7.4 (H) 08/01/2023   Lab Results  Component Value Date   NA 142 04/24/2024   K 3.5 04/24/2024   CREATININE 0.84 04/24/2024   EGFR 72 04/24/2024   GLUCOSE 109 (H) 04/24/2024     Medications: Outpatient Medications Prior to Visit  Medication Sig Note   acetaminophen  (TYLENOL ) 500 MG tablet Take 500 mg by mouth at bedtime as needed.    albuterol (VENTOLIN HFA) 108 (90 Base) MCG/ACT inhaler SMARTSIG:2 inhalation Via Inhaler Every 6 Hours PRN    Alcohol Swabs (DROPSAFE ALCOHOL PREP) 70 % PADS USE ONE TIME DAILY    atorvastatin  (LIPITOR) 20 MG tablet TAKE 1 TABLET EVERY DAY    baclofen  (LIORESAL ) 10 MG tablet Take 1 tablet (10 mg total) by mouth at bedtime.    Blood Glucose Calibration (TRUE METRIX LEVEL 1) Low SOLN Use as directed with glucose meter    cyanocobalamin  (VITAMIN B12) 1000 MCG tablet Take 1 tablet (1,000 mcg total) by mouth daily.    cyclobenzaprine  (FLEXERIL ) 5 MG tablet Take 5 mg by mouth 3 (three) times daily as  needed.    dapagliflozin  propanediol (FARXIGA ) 10 MG TABS tablet Take 1 tablet (10 mg total) by mouth daily.    ferrous sulfate  325 (65 FE) MG tablet Take 325 mg by mouth 2 (two) times daily with a meal.    JANUMET  XR [phone] MG TB24 TAKE 1 TABLET EVERY DAY (NEED MD APPOINTMENT FOR REFILLS)    Multiple Vitamin (MULTIVITAMIN) tablet Take 1 tablet by mouth daily.    pantoprazole  (PROTONIX ) 40 MG tablet TAKE 1 TABLET EVERY DAY    sacubitril -valsartan  (ENTRESTO ) 24-26 MG Take 0.5 tablets by mouth 2 (two) times daily.    senna (SENOKOT) 8.6 MG tablet Take 1 tablet by mouth daily as needed.    SYNTHROID  50 MCG tablet TAKE 1 TABLET EVERY DAY BEFORE BREAKFAST    TRUE METRIX BLOOD GLUCOSE TEST test strip CHECK BLOOD SUGAR EVERY DAY    TRUEplus Lancets 33G MISC TEST BLOOD SUGAR ONE TIME DAILY    No facility-administered medications prior to visit.    Review of Systems     Objective    BP 112/63 (BP Location: Left Arm, Patient Position: Sitting, Cuff Size: Normal)   Pulse 95   Temp (!) 97.3 F (36.3 C) (Oral)   Ht 5' 1 (1.549 m)   Wt 110 lb 1.6 oz (49.9 kg)   LMP  (LMP  Unknown)   SpO2 97%   BMI 20.80 kg/m    Physical Exam    General: Appearance:    Well developed, well nourished female in no acute distress  Eyes:    PERRL, conjunctiva/corneas clear, EOM's intact       Lungs:     Clear to auscultation bilaterally, respirations unlabored  Heart:    Normal heart rate. Normal rhythm.  2/6 systolic murmur   MS:   All extremities are intact.  2+ edema  Neurologic:   Awake, alert, oriented x 3. No apparent focal neurological defect.         Results for orders placed or performed in visit on 06/05/24  POCT HgB A1C  Result Value Ref Range   Hemoglobin A1C 6.7 (A) 4.0 - 5.6 %    Assessment & Plan     1. Type 2 diabetes mellitus with other specified complication, without long-term current use of insulin (HCC) (Primary) Well controlled.  Continue current medications.    2. Chronic  systolic heart failure (HCC) Well compensated. Off of scheduled furosemide  per cardiology. Continue regular cardiology follow up.   Future Appointments  Date Time Provider Department Center  06/13/2024  9:00 AM [facility] LAB [facility] None  06/17/2024 10:15 AM Babara Call, MD [facility] None  06/25/2024  7:00 AM [facility] HVT DEVICE REMOTES [facility] H&V  09/24/2024  7:00 AM [facility] HVT DEVICE REMOTES [facility] H&V  10/25/2024 10:40 AM Gasper Nancyann BRAVO, MD [facility]  12/24/2024  7:00 AM CVD HVT DEVICE REMOTES [facility] H&V  01/01/2025  3:10 PM [facility]-ANNUAL WELLNESS VISIT [facility]  03/25/2025  7:00 AM [facility] HVT DEVICE REMOTES [facility] H&V  06/24/2025  7:00 AM [facility] HVT DEVICE REMOTES [facility] H&V          Nancyann Gasper, MD  [facility] [phone] (phone) [phone] (fax)  [facility]

## 2024-06-05 NOTE — Patient Instructions (Signed)
 Marland Kitchen  Please review the attached list of medications and notify my office if there are any errors.   . Please bring all of your medications to every appointment so we can make sure that our medication list is the same as yours.

## 2024-06-13 ENCOUNTER — Inpatient Hospital Stay: Attending: Oncology

## 2024-06-13 DIAGNOSIS — E039 Hypothyroidism, unspecified: Secondary | ICD-10-CM | POA: Insufficient documentation

## 2024-06-13 DIAGNOSIS — M48061 Spinal stenosis, lumbar region without neurogenic claudication: Secondary | ICD-10-CM | POA: Diagnosis not present

## 2024-06-13 DIAGNOSIS — Z87891 Personal history of nicotine dependence: Secondary | ICD-10-CM | POA: Diagnosis not present

## 2024-06-13 DIAGNOSIS — I420 Dilated cardiomyopathy: Secondary | ICD-10-CM | POA: Insufficient documentation

## 2024-06-13 DIAGNOSIS — Z8042 Family history of malignant neoplasm of prostate: Secondary | ICD-10-CM | POA: Insufficient documentation

## 2024-06-13 DIAGNOSIS — M4317 Spondylolisthesis, lumbosacral region: Secondary | ICD-10-CM | POA: Diagnosis not present

## 2024-06-13 DIAGNOSIS — E119 Type 2 diabetes mellitus without complications: Secondary | ICD-10-CM | POA: Diagnosis not present

## 2024-06-13 DIAGNOSIS — E785 Hyperlipidemia, unspecified: Secondary | ICD-10-CM | POA: Diagnosis not present

## 2024-06-13 DIAGNOSIS — D696 Thrombocytopenia, unspecified: Secondary | ICD-10-CM | POA: Diagnosis present

## 2024-06-13 DIAGNOSIS — I509 Heart failure, unspecified: Secondary | ICD-10-CM | POA: Insufficient documentation

## 2024-06-13 DIAGNOSIS — Z79899 Other long term (current) drug therapy: Secondary | ICD-10-CM | POA: Diagnosis not present

## 2024-06-13 DIAGNOSIS — Z7984 Long term (current) use of oral hypoglycemic drugs: Secondary | ICD-10-CM | POA: Diagnosis not present

## 2024-06-13 DIAGNOSIS — Z5986 Financial insecurity: Secondary | ICD-10-CM | POA: Insufficient documentation

## 2024-06-13 LAB — CBC WITH DIFFERENTIAL (CANCER CENTER ONLY)
Abs Immature Granulocytes: 0.03 K/uL (ref 0.00–0.07)
Basophils Absolute: 0 K/uL (ref 0.0–0.1)
Basophils Relative: 1 %
Eosinophils Absolute: 0 K/uL (ref 0.0–0.5)
Eosinophils Relative: 1 %
HCT: 37.3 % (ref 36.0–46.0)
Hemoglobin: 11.8 g/dL — ABNORMAL LOW (ref 12.0–15.0)
Immature Granulocytes: 1 %
Lymphocytes Relative: 35 %
Lymphs Abs: 1.1 K/uL (ref 0.7–4.0)
MCH: 29.1 pg (ref 26.0–34.0)
MCHC: 31.6 g/dL (ref 30.0–36.0)
MCV: 92.1 fL (ref 80.0–100.0)
Monocytes Absolute: 0.3 K/uL (ref 0.1–1.0)
Monocytes Relative: 10 %
Neutro Abs: 1.7 K/uL (ref 1.7–7.7)
Neutrophils Relative %: 52 %
Platelet Count: 72 K/uL — ABNORMAL LOW (ref 150–400)
RBC: 4.05 MIL/uL (ref 3.87–5.11)
RDW: 12.7 % (ref 11.5–15.5)
WBC Count: 3.3 K/uL — ABNORMAL LOW (ref 4.0–10.5)
nRBC: 0 % (ref 0.0–0.2)

## 2024-06-13 LAB — IRON AND TIBC
Iron: 63 ug/dL (ref 28–170)
Saturation Ratios: 21 % (ref 10.4–31.8)
TIBC: 304 ug/dL (ref 250–450)
UIBC: 241 ug/dL

## 2024-06-13 LAB — RETIC PANEL
Immature Retic Fract: 10 % (ref 2.3–15.9)
RBC.: 4 MIL/uL (ref 3.87–5.11)
Retic Count, Absolute: 50 K/uL (ref 19.0–186.0)
Retic Ct Pct: 1.3 % (ref 0.4–3.1)
Reticulocyte Hemoglobin: 33.5 pg (ref >27.9)

## 2024-06-13 LAB — FERRITIN: Ferritin: 122 ng/mL (ref 11–307)

## 2024-06-13 LAB — VITAMIN B12: Vitamin B-12: 920 pg/mL — ABNORMAL HIGH (ref 180–914)

## 2024-06-17 ENCOUNTER — Encounter: Payer: Self-pay | Admitting: Oncology

## 2024-06-17 ENCOUNTER — Inpatient Hospital Stay: Admitting: Oncology

## 2024-06-17 VITALS — BP 114/71 | HR 74 | Temp 97.9°F | Wt 113.5 lb

## 2024-06-17 DIAGNOSIS — D696 Thrombocytopenia, unspecified: Secondary | ICD-10-CM

## 2024-06-17 NOTE — Assessment & Plan Note (Addendum)
 Chronic thrombocytopenia, likely ITP  Previous work up includes normal spleen size on US .  Adequate B12, folate,normal immature platelet fraction, no M protein on myeloma panel, normal light chain ratio, elevated LDH, negative hepatitis panel, negative HIV B cell arrangement assay showed a clonal B-cell population with an IgH rearrangement. Discussed with pathology, this could be a pseudo clone.  Bone marrow biopsy showed no obvious explanation of thrombocytopenia.  Platelet count fluctuates, overall stable.  Continue observation.

## 2024-06-17 NOTE — Progress Notes (Signed)
 Hematology/Oncology Progress note [phone] [phone]        CHIEF COMPLAINTS/REASON FOR VISIT:  Evaluation of thrombocytopenia  ASSESSMENT & PLAN:   Thrombocytopenia (HCC) Chronic thrombocytopenia, likely ITP  Previous work up includes normal spleen size on US .  Adequate B12, folate,normal immature platelet fraction, no M protein on myeloma panel, normal light chain ratio, elevated LDH, negative hepatitis panel, negative HIV B cell arrangement assay showed a clonal B-cell population with an IgH rearrangement. Discussed with pathology, this could be a pseudo clone.  Bone marrow biopsy showed no obvious explanation of thrombocytopenia.  Platelet count fluctuates, overall stable.  Continue observation.     Orders Placed This Encounter  Procedures   CBC with Differential (Cancer Center Only)    Standing Status:   Future    Expected Date:   12/18/2024    Expiration Date:   03/18/2025   CMP (Cancer Center only)    Standing Status:   Future    Expected Date:   12/18/2024    Expiration Date:   03/18/2025   Vitamin B12    Standing Status:   Future    Expected Date:   12/18/2024    Expiration Date:   03/18/2025   Follow-up 6 months All questions were answered. The patient knows to call the clinic with any problems, questions or concerns.  Zelphia Cap, MD, PhD [facility] Hematology Oncology 06/17/2024    HISTORY OF PRESENTING ILLNESS:  Bethany [name] is a 78 y.o. female who was seen in consultation at the request of Gasper, Nancyann BRAVO, MD for evaluation of thrombocytopenia Patient has a long standing thrombocytopenia history since at least 2014. Her counts fluctuates, ranging from 90s to 120s.  Denies weight loss, fever, chills, fatigue, night sweats.  Denies hematochezia, hematuria, hematemesis, epistaxis, black tarry stool.  Patient has no easy bruising.    Denies history hepatitis or HIV infection Denies history of chronic liver disease Denies routine  alcohol consumption. Denies dietary restrictions.   INTERVAL HISTORY Bethany [name] is a 78 y.o. female who has above history reviewed by me today presents for follow up visit for thrombocytopenia  No new complaints.  Patient denies any bleeding events.   MEDICAL HISTORY:  Past Medical History:  Diagnosis Date   AICD (automatic cardioverter/defibrillator) present    Anemia    Anxiety    CHF (congestive heart failure) (HCC)    Complication of anesthesia    Depression    Diabetes (HCC)    Dilated cardiomyopathy (HCC)    Diverticulosis    Dyspnea    Dysrhythmia    Edema    FEET/ANKLES OCCAS   GERD (gastroesophageal reflux disease)    Hyperlipidemia    Hypothyroidism    LV dysfunction    Orthopnea    USES 3 PILLOWS   PONV (postoperative nausea and vomiting)    Reflux    Thrombocytopenia (HCC)    Thyroid  disease    VHD (valvular heart disease)     SURGICAL HISTORY: Past Surgical History:  Procedure Laterality Date   ABDOMINAL HYSTERECTOMY     BI-VENTRICULAR IMPLANTABLE CARDIOVERTER DEFIBRILLATOR [name] 02/11/2015   Procedure: BI-VENTRICULAR IMPLANTABLE CARDIOVERTER DEFIBRILLATOR  (CRT-D);  Surgeon: Elspeth JAYSON Sage, MD;  Location: [facility] CATH LAB;  Service: Cardiovascular;  Laterality: [name];   BONE MARROW BIOPSY  2007   spine   BREAST BIOPSY Right    neg   BREAST EXCISIONAL BIOPSY Right    CARDIAC CATHETERIZATION     CATARACT EXTRACTION W/PHACO Right  01/03/2017   Procedure: CATARACT EXTRACTION PHACO AND INTRAOCULAR LENS PLACEMENT (IOC);  Surgeon: Dene Etienne, MD;  Location: [facility] ORS;  Service: Ophthalmology;  Laterality: Right;  US  00:50 AP% 19.5 CDE 9.94 fluid pack lot # [id number]   CATARACT EXTRACTION W/PHACO Left 10/27/2016   Procedure: CATARACT EXTRACTION PHACO AND INTRAOCULAR LENS PLACEMENT (IOC);  Surgeon: Etienne Dene, MD;  Location: [facility] ORS;  Service: Ophthalmology;  Laterality: Left;  US   01:12 AP%  21.0 CDE  11.59 Fluid pack lot # [id number]    CHOLECYSTECTOMY     ICD GENERATOR CHANGEOUT [name] 03/29/2022   Procedure: ICD GENERATOR CHANGEOUT;  Surgeon: Fernande Elspeth BROCKS, MD;  Location: [facility] INVASIVE CV LAB;  Service: Cardiovascular;  Laterality: [name];   INSERT / REPLACE / REMOVE PACEMAKER     AICD   IR BONE MARROW BIOPSY & ASPIRATION  07/06/2023   LEAD REVISION [name] 02/11/2015   Procedure: LEAD REVISION;  Surgeon: Elspeth BROCKS Fernande, MD;  Location: [facility] CATH LAB;  Service: Cardiovascular;  Laterality: [name];   PARTIAL HYSTERECTOMY  1977   TOOTH EXTRACTION  09/23/2015    SOCIAL HISTORY: Social History   Socioeconomic History   Marital status: Divorced    Spouse name: Not on file   Number of children: 4   Years of education: Not on file   Highest education level: GED or equivalent  Occupational History   Occupation: retired  Tobacco Use   Smoking status: Never   Smokeless tobacco: Former    Types: Snuff    Quit date: 10/27/1969  Vaping Use   Vaping status: Never Used  Substance and Sexual Activity   Alcohol use: No    Alcohol/week: 0.0 standard drinks of alcohol   Drug use: No   Sexual activity: Not on file  Other Topics Concern   Not on file  Social History Narrative   Not on file   Social Drivers of Health   Financial Resource Strain: Medium Risk (06/04/2024)   Overall Financial Resource Strain (CARDIA)    Difficulty of Paying Living Expenses: Somewhat hard  Food Insecurity: No Food Insecurity (06/04/2024)   Hunger Vital Sign    Worried About Running Out of Food in the Last Year: Never true    Ran Out of Food in the Last Year: Never true  Transportation Needs: Unmet Transportation Needs (06/04/2024)   PRAPARE - Administrator, Civil Service (Medical): Yes    Lack of Transportation (Non-Medical): No  Physical Activity: Insufficiently Active (06/04/2024)   Exercise Vital Sign    Days of Exercise per Week: 2 days    Minutes of Exercise per Session: 40 min  Stress: No Stress Concern Present (06/04/2024)   [employer] of Occupational Health - Occupational Stress Questionnaire    Feeling of Stress: Only a little  Social Connections: Moderately Integrated (06/04/2024)   Social Connection and Isolation Panel    Frequency of Communication with Friends and Family: More than three times a week    Frequency of Social Gatherings with Friends and Family: More than three times a week    Attends Religious Services: More than 4 times per year    Active Member of [employer] or Organizations: Yes    Attends Banker Meetings: More than 4 times per year    Marital Status: Divorced  Intimate Partner Violence: Not At Risk (12/27/2023)   Humiliation, Afraid, Rape, and Kick questionnaire    Fear of Current or Ex-Partner: No    Emotionally Abused: No  Physically Abused: No    Sexually Abused: No    FAMILY HISTORY: Family History  Problem Relation Age of Onset   Heart attack Mother    Heart disease Mother    Prostate cancer Father    Ulcers Father    Heart disease Sister    Stroke Sister    Heart attack Sister    Diabetes Sister    Heart disease Sister    Diabetes Sister    Heart attack Sister    Diabetes Sister    Diabetes Sister    Diabetes Sister    Diabetes Brother    Heart attack Brother    Stroke Brother    Breast cancer Neg Hx     ALLERGIES:  is allergic to other and sulfa antibiotics.  MEDICATIONS:  Current Outpatient Medications  Medication Sig Dispense Refill   acetaminophen  (TYLENOL ) 500 MG tablet Take 500 mg by mouth at bedtime as needed.     albuterol (VENTOLIN HFA) 108 (90 Base) MCG/ACT inhaler SMARTSIG:2 inhalation Via Inhaler Every 6 Hours PRN     Alcohol Swabs (DROPSAFE ALCOHOL PREP) 70 % PADS USE ONE TIME DAILY 100 each 3   atorvastatin  (LIPITOR) 20 MG tablet TAKE 1 TABLET EVERY DAY 90 tablet 3   Blood Glucose Calibration (TRUE METRIX LEVEL 1) Low SOLN Use as directed with glucose meter 1 each 3   cyanocobalamin  (VITAMIN B12) 1000 MCG tablet Take 1 tablet (1,000 mcg  total) by mouth daily. 90 tablet 1   dapagliflozin  propanediol (FARXIGA ) 10 MG TABS tablet Take 1 tablet (10 mg total) by mouth daily. 90 tablet 1   ferrous sulfate  325 (65 FE) MG tablet Take 325 mg by mouth 2 (two) times daily with a meal.     JANUMET  XR [phone] MG TB24 TAKE 1 TABLET EVERY DAY (NEED MD APPOINTMENT FOR REFILLS) 90 tablet 3   Multiple Vitamin (MULTIVITAMIN) tablet Take 1 tablet by mouth daily.     pantoprazole  (PROTONIX ) 40 MG tablet TAKE 1 TABLET EVERY DAY 90 tablet 3   sacubitril -valsartan  (ENTRESTO ) 24-26 MG Take 0.5 tablets by mouth 2 (two) times daily.     senna (SENOKOT) 8.6 MG tablet Take 1 tablet by mouth daily as needed.     SYNTHROID  50 MCG tablet TAKE 1 TABLET EVERY DAY BEFORE BREAKFAST 90 tablet 3   TRUE METRIX BLOOD GLUCOSE TEST test strip CHECK BLOOD SUGAR EVERY DAY 100 strip 3   TRUEplus Lancets 33G MISC TEST BLOOD SUGAR ONE TIME DAILY 100 each 1   No current facility-administered medications for this visit.    Review of Systems  Constitutional:  Positive for fatigue. Negative for appetite change, chills and fever.  HENT:   Negative for hearing loss and voice change.   Eyes:  Negative for eye problems.  Respiratory:  Negative for chest tightness and cough.   Cardiovascular:  Negative for chest pain.  Gastrointestinal:  Negative for abdominal distention, abdominal pain and blood in stool.  Endocrine: Negative for hot flashes.  Genitourinary:  Negative for difficulty urinating and frequency.   Musculoskeletal:  Negative for arthralgias.  Skin:  Negative for itching and rash.  Neurological:  Negative for extremity weakness.  Hematological:  Negative for adenopathy.  Psychiatric/Behavioral:  Negative for confusion.     PHYSICAL EXAMINATION: ECOG PERFORMANCE STATUS: 0 - Asymptomatic Vitals:   06/17/24 1004  BP: 114/71  Pulse: 74  Temp: 97.9 F (36.6 C)  SpO2: 98%   Filed Weights   06/17/24 1004  Weight: 113  lb 8 oz (51.5 kg)    Physical  Exam Constitutional:      General: She is not in acute distress. HENT:     Head: Normocephalic and atraumatic.  Eyes:     General: No scleral icterus. Cardiovascular:     Rate and Rhythm: Normal rate and regular rhythm.  Pulmonary:     Effort: Pulmonary effort is normal. No respiratory distress.  Abdominal:     General: Bowel sounds are normal. There is no distension.     Palpations: Abdomen is soft.  Musculoskeletal:        General: Normal range of motion.     Cervical back: Normal range of motion and neck supple.  Skin:    Findings: No erythema or rash.  Neurological:     Mental Status: She is alert and oriented to person, place, and time. Mental status is at baseline.  Psychiatric:        Mood and Affect: Mood normal.      LABORATORY DATA:  I have reviewed the data as listed    Latest Ref Rng & Units 06/13/2024    8:57 AM 02/05/2024    8:54 AM 01/04/2024    3:08 PM  CBC  WBC 4.0 - 10.5 K/uL 3.3  3.1  5.5   Hemoglobin 12.0 - 15.0 g/dL 88.1  88.1  87.5   Hematocrit 36.0 - 46.0 % 37.3  37.1  36.6   Platelets 150 - 400 K/uL 72  89  70       Latest Ref Rng & Units 04/24/2024    1:19 PM 02/05/2024    8:54 AM 01/15/2024    9:46 AM  CMP  Glucose 70 - 99 mg/dL 890  860  827   BUN 8 - 27 mg/dL 10  10  8    Creatinine 0.57 - 1.00 mg/dL 9.15  9.19  9.23   Sodium 134 - 144 mmol/L 142  137  139   Potassium 3.5 - 5.2 mmol/L 3.5  4.0  4.7   Chloride 96 - 106 mmol/L 100  104  101   CO2 20 - 29 mmol/L 23  26  25    Calcium  8.7 - 10.3 mg/dL 89.9  9.3  9.5   Total Protein 6.0 - 8.5 g/dL 7.1  6.8    Total Bilirubin 0.0 - 1.2 mg/dL 0.3  0.5    Alkaline Phos 44 - 121 IU/L 85  70    AST 0 - 40 IU/L 25  25    ALT 0 - 32 IU/L 13  14       RADIOGRAPHIC STUDIES: I have personally reviewed the radiological images as listed and agreed with the findings in the report.  DG Lumbar Spine Complete Result Date: 04/24/2024 CLINICAL DATA:  Low back pain. EXAM: LUMBAR SPINE - COMPLETE 4+ VIEW  COMPARISON:  November 26, 2021 FINDINGS: Six non-rib-bearing type vertebral bodies. For the purpose of this study the lowest well-formed intervertebral disc space is designated L5-S1. There is no evidence of lumbar spine fracture. Grade 1 anterolisthesis of L5 on S1 unchanged. Moderate narrow intervertebral space at L5-S1, mild narrow intervertebral space at L4-5. Mild anterior spurring noted at L1, L2, L3, L4. IMPRESSION: Degenerative joint changes of lumbar spine more prominently involving the L5-S1 level. Electronically Signed   By: Craig Farr M.D.   On: 04/24/2024 15:05   CUP PACEART REMOTE DEVICE CHECK Result Date: 03/27/2024 ICD Scheduled remote reviewed. Normal device function.  Presenting rhythm: AS/BiV pace Next  remote 91 days. LA, CVRS

## 2024-06-25 ENCOUNTER — Ambulatory Visit (INDEPENDENT_AMBULATORY_CARE_PROVIDER_SITE_OTHER): Payer: Medicare HMO

## 2024-06-25 DIAGNOSIS — I428 Other cardiomyopathies: Secondary | ICD-10-CM | POA: Diagnosis not present

## 2024-06-26 LAB — CUP PACEART REMOTE DEVICE CHECK
Battery Remaining Longevity: 62 mo
Battery Voltage: 2.98 V
Brady Statistic AP VP Percent: 0.04 %
Brady Statistic AP VS Percent: 0.01 %
Brady Statistic AS VP Percent: 98.68 %
Brady Statistic AS VS Percent: 1.26 %
Brady Statistic RA Percent Paced: 0.05 %
Brady Statistic RV Percent Paced: 9.23 %
Date Time Interrogation Session: 20250819044221
HighPow Impedance: 52 Ohm
Implantable Lead Connection Status: 753985
Implantable Lead Connection Status: 753985
Implantable Lead Connection Status: 753985
Implantable Lead Implant Date: 20160409
Implantable Lead Implant Date: 20160409
Implantable Lead Implant Date: 20160409
Implantable Lead Location: 753858
Implantable Lead Location: 753859
Implantable Lead Location: 753860
Implantable Lead Model: 4598
Implantable Lead Model: 5076
Implantable Pulse Generator Implant Date: 20230523
Lead Channel Impedance Value: 1140 Ohm
Lead Channel Impedance Value: 1197 Ohm
Lead Channel Impedance Value: 1197 Ohm
Lead Channel Impedance Value: 262.946
Lead Channel Impedance Value: 262.946
Lead Channel Impedance Value: 286.508
Lead Channel Impedance Value: 324.377
Lead Channel Impedance Value: 324.377
Lead Channel Impedance Value: 342 Ohm
Lead Channel Impedance Value: 399 Ohm
Lead Channel Impedance Value: 418 Ohm
Lead Channel Impedance Value: 475 Ohm
Lead Channel Impedance Value: 589 Ohm
Lead Channel Impedance Value: 589 Ohm
Lead Channel Impedance Value: 722 Ohm
Lead Channel Impedance Value: 931 Ohm
Lead Channel Impedance Value: 988 Ohm
Lead Channel Impedance Value: 988 Ohm
Lead Channel Pacing Threshold Amplitude: 0.375 V
Lead Channel Pacing Threshold Amplitude: 0.75 V
Lead Channel Pacing Threshold Amplitude: 1.75 V
Lead Channel Pacing Threshold Pulse Width: 0.4 ms
Lead Channel Pacing Threshold Pulse Width: 0.4 ms
Lead Channel Pacing Threshold Pulse Width: 1 ms
Lead Channel Sensing Intrinsic Amplitude: 0.25 mV
Lead Channel Sensing Intrinsic Amplitude: 0.25 mV
Lead Channel Sensing Intrinsic Amplitude: 7.875 mV
Lead Channel Sensing Intrinsic Amplitude: 7.875 mV
Lead Channel Setting Pacing Amplitude: 1.5 V
Lead Channel Setting Pacing Amplitude: 2 V
Lead Channel Setting Pacing Amplitude: 2.25 V
Lead Channel Setting Pacing Pulse Width: 0.4 ms
Lead Channel Setting Pacing Pulse Width: 1 ms
Lead Channel Setting Sensing Sensitivity: 0.3 mV
Zone Setting Status: 755011

## 2024-06-28 ENCOUNTER — Ambulatory Visit: Payer: Self-pay | Admitting: Cardiology

## 2024-06-30 ENCOUNTER — Other Ambulatory Visit: Payer: Self-pay | Admitting: Family Medicine

## 2024-06-30 DIAGNOSIS — E119 Type 2 diabetes mellitus without complications: Secondary | ICD-10-CM

## 2024-07-02 NOTE — Telephone Encounter (Signed)
 Requested medication (s) are due for refill today: yes  Requested medication (s) are on the active medication list: yes  Last refill:  12/25/23 100 each  Future visit scheduled: yes  Notes to clinic:  item not assigned to a protocol   Requested Prescriptions  Pending Prescriptions Disp Refills   TRUEplus Lancets 33G MISC [Pharmacy Med Name: TRUEPLUS LANCETS 33G] 100 each 3    Sig: TEST BLOOD SUGAR ONE TIME DAILY     Endocrinology: Diabetes - Testing Supplies Passed - 07/02/2024 11:50 AM      Passed - Valid encounter within last 12 months    Recent Outpatient Visits           3 weeks ago Type 2 diabetes mellitus with other specified complication, without long-term current use of insulin (HCC)   [place] [facility] Gasper Nancyann BRAVO, MD   2 months ago Acute midline low back pain without sciatica   [facility] Melvin Pao, NP   5 months ago Type 2 diabetes mellitus with other specified complication, without long-term current use of insulin [facility])   [place] [facility] Gasper Nancyann BRAVO, MD   5 months ago COVID-19   [facility] [place], [place], [place]

## 2024-07-15 ENCOUNTER — Other Ambulatory Visit: Payer: Self-pay | Admitting: Family Medicine

## 2024-07-31 NOTE — Progress Notes (Signed)
Remote ICD Transmission.

## 2024-08-12 ENCOUNTER — Other Ambulatory Visit: Payer: Self-pay | Admitting: Family Medicine

## 2024-08-12 DIAGNOSIS — E119 Type 2 diabetes mellitus without complications: Secondary | ICD-10-CM

## 2024-08-12 NOTE — Telephone Encounter (Unsigned)
 Copied from CRM [id number]. Topic: Clinical - Medication Refill >> Aug 12, 2024  2:43 PM Turkey B wrote: Medication: TRUE METRIX BLOOD GLUCOSE TEST test strip  Patient asking for short supply until refill come in Has the patient contacted their pharmacy? yes  (Agent: If yes, when and what did the pharmacy advise?)contact pcp  This is the patient's preferred pharmacy:  [facility] - [place], [place] - [address] [place] [address] Phone: [phone] Fax: [phone]  Is this the correct pharmacy for this prescription? yes Has the prescription been filled recently? no  Is the patient out of the medication? yes  Has the patient been seen for an appointment in the last year OR does the patient have an upcoming appointment? yes  Can we respond through MyChart? yes  Agent: Please be advised that Rx refills may take up to 3 business days. We ask that you follow-up with your pharmacy.

## 2024-08-14 DIAGNOSIS — H353121 Nonexudative age-related macular degeneration, left eye, early dry stage: Secondary | ICD-10-CM | POA: Diagnosis not present

## 2024-08-14 DIAGNOSIS — E119 Type 2 diabetes mellitus without complications: Secondary | ICD-10-CM | POA: Diagnosis not present

## 2024-08-14 DIAGNOSIS — H26492 Other secondary cataract, left eye: Secondary | ICD-10-CM | POA: Diagnosis not present

## 2024-08-14 DIAGNOSIS — H02413 Mechanical ptosis of bilateral eyelids: Secondary | ICD-10-CM | POA: Diagnosis not present

## 2024-08-14 DIAGNOSIS — H02403 Unspecified ptosis of bilateral eyelids: Secondary | ICD-10-CM | POA: Diagnosis not present

## 2024-08-14 LAB — OPHTHALMOLOGY REPORT-SCANNED

## 2024-08-14 NOTE — Telephone Encounter (Signed)
 Duplicate request, refilled 08/12/24.  Requested Prescriptions  Pending Prescriptions Disp Refills   glucose blood (TRUE METRIX BLOOD GLUCOSE TEST) test strip 100 strip 3    Sig: CHECK BLOOD SUGAR EVERY DAY     Endocrinology: Diabetes - Testing Supplies Passed - 08/14/2024  9:59 AM      Passed - Valid encounter within last 12 months    Recent Outpatient Visits           2 months ago Type 2 diabetes mellitus with other specified complication, without long-term current use of insulin (HCC)   [place] [facility] Gasper Nancyann BRAVO, MD   3 months ago Acute midline low back pain without sciatica   [facility] Melvin Pao, NP   7 months ago Type 2 diabetes mellitus with other specified complication, without long-term current use of insulin [facility])   [facility] Gasper Nancyann BRAVO, MD   7 months ago COVID-19   [facility] [place], [place], [place]

## 2024-08-27 DIAGNOSIS — R0602 Shortness of breath: Secondary | ICD-10-CM | POA: Diagnosis not present

## 2024-08-27 DIAGNOSIS — J4530 Mild persistent asthma, uncomplicated: Secondary | ICD-10-CM | POA: Diagnosis not present

## 2024-08-27 DIAGNOSIS — J301 Allergic rhinitis due to pollen: Secondary | ICD-10-CM | POA: Diagnosis not present

## 2024-08-30 DIAGNOSIS — Z95810 Presence of automatic (implantable) cardiac defibrillator: Secondary | ICD-10-CM | POA: Diagnosis not present

## 2024-08-30 DIAGNOSIS — E782 Mixed hyperlipidemia: Secondary | ICD-10-CM | POA: Diagnosis not present

## 2024-08-30 DIAGNOSIS — D696 Thrombocytopenia, unspecified: Secondary | ICD-10-CM | POA: Diagnosis not present

## 2024-08-30 DIAGNOSIS — I10 Essential (primary) hypertension: Secondary | ICD-10-CM | POA: Diagnosis not present

## 2024-08-30 DIAGNOSIS — I4891 Unspecified atrial fibrillation: Secondary | ICD-10-CM | POA: Diagnosis not present

## 2024-08-30 DIAGNOSIS — I4892 Unspecified atrial flutter: Secondary | ICD-10-CM | POA: Diagnosis not present

## 2024-08-30 DIAGNOSIS — I5022 Chronic systolic (congestive) heart failure: Secondary | ICD-10-CM | POA: Diagnosis not present

## 2024-08-30 DIAGNOSIS — I447 Left bundle-branch block, unspecified: Secondary | ICD-10-CM | POA: Diagnosis not present

## 2024-09-03 ENCOUNTER — Other Ambulatory Visit (HOSPITAL_COMMUNITY): Payer: Self-pay

## 2024-09-06 NOTE — Progress Notes (Signed)
 Bethany [name]                                          MRN: [id number]   09/06/2024   The VBCI Quality Team Specialist reviewed this patient medical record for the purposes of chart review for care gap closure. The following were reviewed: chart review for care gap closure-kidney health evaluation for diabetes:eGFR  and uACR.    VBCI Quality Team

## 2024-09-09 LAB — MICROALBUMIN / CREATININE URINE RATIO: Microalb Creat Ratio: 3

## 2024-09-16 ENCOUNTER — Other Ambulatory Visit: Payer: Self-pay | Admitting: Family Medicine

## 2024-09-16 DIAGNOSIS — I5022 Chronic systolic (congestive) heart failure: Secondary | ICD-10-CM

## 2024-09-16 DIAGNOSIS — E119 Type 2 diabetes mellitus without complications: Secondary | ICD-10-CM

## 2024-09-20 NOTE — Progress Notes (Signed)
 Pharmacy Quality Measure Review  This patient is appearing on a report for being at risk of failing the  Kidney Health Evaluation for Patients with Diabetes measure this calendar year.   Last documented A1c on 06/05/24  Last documented UACR on 08/01/23  Appointment with PCP scheduled on 10/25/24. Will update appointment notes to obtain UACR at follow up.  Liesel Peckenpaugh E. Marsh, PharmD, CPP Clinical Pharmacist [facility] [phone]

## 2024-09-21 ENCOUNTER — Other Ambulatory Visit: Payer: Self-pay | Admitting: Family Medicine

## 2024-09-21 DIAGNOSIS — E119 Type 2 diabetes mellitus without complications: Secondary | ICD-10-CM

## 2024-09-27 ENCOUNTER — Encounter: Payer: Self-pay | Admitting: Family Medicine

## 2024-09-30 ENCOUNTER — Ambulatory Visit: Payer: Self-pay

## 2024-09-30 DIAGNOSIS — J069 Acute upper respiratory infection, unspecified: Secondary | ICD-10-CM | POA: Diagnosis not present

## 2024-09-30 DIAGNOSIS — J029 Acute pharyngitis, unspecified: Secondary | ICD-10-CM | POA: Diagnosis not present

## 2024-09-30 DIAGNOSIS — Z03818 Encounter for observation for suspected exposure to other biological agents ruled out: Secondary | ICD-10-CM | POA: Diagnosis not present

## 2024-09-30 NOTE — Telephone Encounter (Signed)
 FYI Only or Action Required?: FYI only for provider: At [facility].  Patient was last seen in primary care on 06/05/2024 by Gasper Nancyann BRAVO, MD.  Called Nurse Triage reporting Cough.  Symptoms began n/a.  Interventions attempted: Other: n/a.  Symptoms are: n/a.  Triage Disposition: Information or Advice Only Call  Patient/caregiver understands and will follow disposition?: Yes Reason for Disposition  [1] Follow-up call to recent contact AND [2] information only call, no triage required  Answer Assessment - Initial Assessment Questions 1. REASON FOR CALL: What is the main reason for your call? or How can I best help you?     Called patient back regarding her call in for her cough, patient stated she did not know when she'd get called back so she headed over to [facility] and just got checked in. Advised patient to call us  if she needs anything.   2. SYMPTOMS : Do you have any symptoms?      Cough  3. OTHER QUESTIONS: Do you have any other questions?     [name]  Protocols used: Information Only Call - No Triage-A-AH  Copied from CRM [id number]. Topic: Appointments - Appointment Scheduling >> Sep 30, 2024  9:26 AM Tonda B wrote: Patient/patient representative is calling to schedule an appointment. Refer to attachments for appointment information.  Patient is calling in has questions about her coughing [phone]

## 2024-10-16 ENCOUNTER — Telehealth: Payer: Self-pay

## 2024-10-16 DIAGNOSIS — I5022 Chronic systolic (congestive) heart failure: Secondary | ICD-10-CM

## 2024-10-16 DIAGNOSIS — E119 Type 2 diabetes mellitus without complications: Secondary | ICD-10-CM

## 2024-10-16 NOTE — Telephone Encounter (Signed)
 Tried reaching out to pt twice pt has not return call pt is due for re-enrollment on [employer] (Farxiga ) mail out pap and faxed provider portion today.

## 2024-10-25 ENCOUNTER — Ambulatory Visit (INDEPENDENT_AMBULATORY_CARE_PROVIDER_SITE_OTHER): Admitting: Family Medicine

## 2024-10-25 VITALS — BP 106/65 | HR 95 | Ht 61.0 in | Wt 116.4 lb

## 2024-10-25 DIAGNOSIS — E119 Type 2 diabetes mellitus without complications: Secondary | ICD-10-CM

## 2024-10-25 DIAGNOSIS — R0602 Shortness of breath: Secondary | ICD-10-CM | POA: Diagnosis not present

## 2024-10-25 DIAGNOSIS — E039 Hypothyroidism, unspecified: Secondary | ICD-10-CM | POA: Diagnosis not present

## 2024-10-25 DIAGNOSIS — D696 Thrombocytopenia, unspecified: Secondary | ICD-10-CM | POA: Diagnosis not present

## 2024-10-25 DIAGNOSIS — M549 Dorsalgia, unspecified: Secondary | ICD-10-CM | POA: Diagnosis not present

## 2024-10-25 DIAGNOSIS — E559 Vitamin D deficiency, unspecified: Secondary | ICD-10-CM

## 2024-10-25 DIAGNOSIS — I5020 Unspecified systolic (congestive) heart failure: Secondary | ICD-10-CM

## 2024-10-25 DIAGNOSIS — Z950 Presence of cardiac pacemaker: Secondary | ICD-10-CM

## 2024-10-25 DIAGNOSIS — Z95810 Presence of automatic (implantable) cardiac defibrillator: Secondary | ICD-10-CM

## 2024-10-25 DIAGNOSIS — E7800 Pure hypercholesterolemia, unspecified: Secondary | ICD-10-CM | POA: Diagnosis not present

## 2024-10-25 DIAGNOSIS — R531 Weakness: Secondary | ICD-10-CM | POA: Diagnosis not present

## 2024-10-25 LAB — POCT GLYCOSYLATED HEMOGLOBIN (HGB A1C): Hemoglobin A1C: 6.9 % — AB (ref 4.0–5.6)

## 2024-10-25 NOTE — Progress Notes (Signed)
 "     Established patient visit   Patient: Bethany [name]   DOB: Jan 22, 1946   78 y.o. Female  MRN: [id number] Visit Date: 10/25/2024  Today's healthcare provider: Nancyann Perry, MD   Chief Complaint  Patient presents with   Medical Management of Chronic Issues    Patient is present for follow up dm2 and htn,    Subjective    Discussed the use of AI scribe software for clinical note transcription with the patient, who gave verbal consent to proceed.  History of Present Illness   Bethany [name] is a 78 year old female with systolic congestive heart failure, hypothyroidism, and type two diabetes who presents for a routine follow-up.  She experiences intermittent back pain located just left of center of her back, slightly lower than the shoulder blades. The pain is non-constant, not severe, and does not last long, but it recurs, particularly in the morning.  She has daily episodes of weakness and lightheadedness, describing the sensation as lightheadedness rather than dizziness. This has become more frequent and persistent recently.  She has noticed increased shortness of breath over the past couple of weeks. No nocturnal dyspnea, but she does wake up with a dry mouth.  Her recent A1c today is 6.9, similar to a previous result of 6.7 in July.     Lab Results  Component Value Date   TSH 1.770 08/01/2023   Lab Results  Component Value Date   NA 142 04/24/2024   K 3.5 04/24/2024   CREATININE 0.84 04/24/2024   EGFR 72 04/24/2024   GLUCOSE 109 (H) 04/24/2024   Lab Results  Component Value Date   CHOL 111 08/01/2023   HDL 60 08/01/2023   LDLCALC 38 08/01/2023   TRIG 56 08/01/2023   CHOLHDL 2.2 01/13/2021   Lab Results  Component Value Date   VD25OH 41.1 08/01/2023     Medications: Show/hide medication list[1] Review of Systems  Constitutional:  Positive for fatigue. Negative for appetite change, chills and fever.  Respiratory:  Positive for shortness of breath.  Negative for chest tightness.   Cardiovascular:  Negative for chest pain and palpitations.  Gastrointestinal:  Negative for abdominal pain, nausea and vomiting.  Neurological:  Negative for dizziness and weakness.       Objective    BP 106/65 (BP Location: Left Arm, Patient Position: Sitting, Cuff Size: Normal)   Pulse 95   Ht 5' 1 (1.549 m)   Wt 116 lb 6.4 oz (52.8 kg)   LMP  (LMP Unknown)   SpO2 99%   BMI 21.99 kg/m   Physical Exam   General: Appearance:    Well developed, well nourished female in no acute distress  Eyes:    PERRL, conjunctiva/corneas clear, EOM's intact       Lungs:     Clear to auscultation bilaterally, respirations unlabored  Heart:    Normal heart rate. Normal rhythm.  2/6 systolic murmur   MS:   All extremities are intact.    Neurologic:   Awake, alert, oriented x 3. No apparent focal neurological defect.         Results for orders placed or performed in visit on 10/25/24  POCT HgB A1C  Result Value Ref Range   Hemoglobin A1C 6.9 (A) 4.0 - 5.6 %     Assessment & Plan    1. Type 2 diabetes mellitus without complication, without long-term current use of insulin (HCC) (Primary) Well controlled.  Continue current medications.   -  Urine Microalbumin w/creat. ratio  2. Weakness  - CBC - Comprehensive metabolic panel with GFR  3. Shortness of breath  - Brain natriuretic peptide  4. Vitamin D  deficiency  - VITAMIN D  25 Hydroxy (Vit-D Deficiency, Fractures)  5. Adult hypothyroidism  - TSH + free T4  6. Thrombocytopenia Checking   7. Hypercholesteremia She is tolerating atorvastatin  well with no adverse effects.   - Lipid panel  8. Heart failure with improved ejection fraction (HFimpEF) (HCC) Doing well on current CHF cocktail, although unclear if medications contributing to some of her current symptoms.   9. Cardiac pacemaker in situ Continue regular cardiology follow up .   10. Cardiac resynchronization therapy defibrillator  (CRT-D) in place   11. Muscle spasm of back Intermittent back pain likely due to muscle spasm or strain. - Recommended over-the-counter Voltaren cream or lidocaine  patch.         Nancyann Perry, MD  [facility] [phone] (phone) [phone] (fax)  [facility]     [1]  Outpatient Medications Prior to Visit  Medication Sig   acetaminophen  (TYLENOL ) 500 MG tablet Take 500 mg by mouth at bedtime as needed.   albuterol (VENTOLIN HFA) 108 (90 Base) MCG/ACT inhaler SMARTSIG:2 inhalation Via Inhaler Every 6 Hours PRN   Alcohol Swabs (DROPSAFE ALCOHOL PREP) 70 % PADS USE ONE TIME DAILY   atorvastatin  (LIPITOR) 20 MG tablet TAKE 1 TABLET EVERY DAY   Blood Glucose Calibration (TRUE METRIX LEVEL 1) Low SOLN Use as directed with glucose meter   cyanocobalamin  (VITAMIN B12) 1000 MCG tablet Take 1 tablet (1,000 mcg total) by mouth daily.   FARXIGA  10 MG TABS tablet TAKE 1 TABLET EVERY DAY   ferrous sulfate  325 (65 FE) MG tablet Take 325 mg by mouth 2 (two) times daily with a meal.   glucose blood (TRUE METRIX BLOOD GLUCOSE TEST) test strip Test daily ans as needed   JANUMET  XR [id number] MG TB24 TAKE 1 TABLET EVERY DAY (NEED MD APPOINTMENT FOR REFILLS)   Multiple Vitamin (MULTIVITAMIN) tablet Take 1 tablet by mouth daily.   pantoprazole  (PROTONIX ) 40 MG tablet TAKE 1 TABLET EVERY DAY   sacubitril -valsartan  (ENTRESTO ) 24-26 MG Take 0.5 tablets by mouth 2 (two) times daily.   senna (SENOKOT) 8.6 MG tablet Take 1 tablet by mouth daily as needed.   SYNTHROID  50 MCG tablet TAKE 1 TABLET EVERY DAY BEFORE BREAKFAST   TRUEplus Lancets 33G MISC TEST BLOOD SUGAR ONE TIME DAILY   No facility-administered medications prior to visit.   "

## 2024-10-25 NOTE — Patient Instructions (Signed)
 SABRA  Please review the attached list of medications and notify my office if there are any errors.   . Please bring all of your medications to every appointment so we can make sure that our medication list is the same as yours.

## 2024-10-26 ENCOUNTER — Other Ambulatory Visit: Payer: Self-pay | Admitting: Family Medicine

## 2024-10-26 DIAGNOSIS — E119 Type 2 diabetes mellitus without complications: Secondary | ICD-10-CM

## 2024-10-26 LAB — MICROALBUMIN / CREATININE URINE RATIO
Creatinine, Urine: 24.5 mg/dL
Microalb/Creat Ratio: <12 mg/g{creat} (ref 0–29)
Microalbumin, Urine: <3 ug/mL

## 2024-10-27 ENCOUNTER — Ambulatory Visit: Payer: Self-pay | Admitting: Family Medicine

## 2024-10-27 LAB — LIPID PANEL
Chol/HDL Ratio: 1.8 ratio (ref 0.0–4.4)
Cholesterol, Total: 123 mg/dL (ref 100–199)
HDL: 68 mg/dL
LDL Chol Calc (NIH): 40 mg/dL (ref 0–99)
Triglycerides: 74 mg/dL (ref 0–149)
VLDL Cholesterol Cal: 15 mg/dL (ref 5–40)

## 2024-10-27 LAB — COMPREHENSIVE METABOLIC PANEL WITH GFR
ALT: 9 IU/L (ref 0–32)
AST: 20 IU/L (ref 0–40)
Albumin: 4.1 g/dL (ref 3.8–4.8)
Alkaline Phosphatase: 74 IU/L (ref 49–135)
BUN/Creatinine Ratio: 11 — ABNORMAL LOW (ref 12–28)
BUN: 10 mg/dL (ref 8–27)
Bilirubin Total: 0.4 mg/dL (ref 0.0–1.2)
CO2: 24 mmol/L (ref 20–29)
Calcium: 9.6 mg/dL (ref 8.7–10.3)
Chloride: 103 mmol/L (ref 96–106)
Creatinine, Ser: 0.9 mg/dL (ref 0.57–1.00)
Globulin, Total: 2.4 g/dL (ref 1.5–4.5)
Glucose: 196 mg/dL — ABNORMAL HIGH (ref 70–99)
Potassium: 3.9 mmol/L (ref 3.5–5.2)
Sodium: 141 mmol/L (ref 134–144)
Total Protein: 6.5 g/dL (ref 6.0–8.5)
eGFR: 65 mL/min/1.73

## 2024-10-27 LAB — CBC
Hematocrit: 38.2 % (ref 34.0–46.6)
Hemoglobin: 12 g/dL (ref 11.1–15.9)
MCH: 29.6 pg (ref 26.6–33.0)
MCHC: 31.4 g/dL — ABNORMAL LOW (ref 31.5–35.7)
MCV: 94 fL (ref 79–97)
Platelets: 79 x10E3/uL — CL (ref 150–450)
RBC: 4.05 x10E6/uL (ref 3.77–5.28)
RDW: 12.5 % (ref 11.7–15.4)
WBC: 3.5 x10E3/uL (ref 3.4–10.8)

## 2024-10-27 LAB — VITAMIN D 25 HYDROXY (VIT D DEFICIENCY, FRACTURES): Vit D, 25-Hydroxy: 42.7 ng/mL (ref 30.0–100.0)

## 2024-10-27 LAB — TSH+FREE T4
Free T4: 1.39 ng/dL (ref 0.82–1.77)
TSH: 1.34 u[IU]/mL (ref 0.450–4.500)

## 2024-10-27 LAB — BRAIN NATRIURETIC PEPTIDE: BNP: 16.8 pg/mL (ref 0.0–100.0)

## 2024-11-04 ENCOUNTER — Other Ambulatory Visit: Payer: Self-pay | Admitting: Oncology

## 2024-11-06 NOTE — Telephone Encounter (Signed)
 Left voicemail for patient regarding Farxiga  re-enrollment. It appears patient has been filling medication at the pharmacy level. May not need to continue to receive via PAP.  Nayleah Gamel E. Marsh, PharmD, CPP Clinical Pharmacist [facility] [phone]

## 2024-11-15 MED ORDER — DAPAGLIFLOZIN PROPANEDIOL 10 MG PO TABS: 10.0000 mg | ORAL_TABLET | Freq: Every day | ORAL | 3 refills | Status: AC

## 2024-11-15 NOTE — Telephone Encounter (Signed)
 Enrolled patient in Farxiga  PAP online via [employer] portal. Patient is approved until 11/06/25.  Isaly Fasching E. Marsh, PharmD, CPP Clinical Pharmacist [facility] [phone]

## 2024-11-15 NOTE — Addendum Note (Signed)
 Addended by: MARSH, Myda Detwiler E on: 11/15/2024 04:09 PM   Modules accepted: Orders

## 2024-11-29 ENCOUNTER — Ambulatory Visit
Admission: RE | Admit: 2024-11-29 | Discharge: 2024-11-29 | Disposition: A | Source: Ambulatory Visit | Attending: Nurse Practitioner | Admitting: Nurse Practitioner

## 2024-11-29 ENCOUNTER — Other Ambulatory Visit: Payer: Self-pay | Admitting: Nurse Practitioner

## 2024-11-29 DIAGNOSIS — R002 Palpitations: Secondary | ICD-10-CM

## 2024-11-29 DIAGNOSIS — R413 Other amnesia: Secondary | ICD-10-CM | POA: Insufficient documentation

## 2024-11-29 DIAGNOSIS — R4789 Other speech disturbances: Secondary | ICD-10-CM | POA: Diagnosis present

## 2024-12-16 ENCOUNTER — Inpatient Hospital Stay

## 2024-12-23 ENCOUNTER — Inpatient Hospital Stay: Admitting: Oncology

## 2025-01-01 ENCOUNTER — Ambulatory Visit: Payer: Medicare PPO
